# Patient Record
Sex: Male | Born: 1960 | Race: White | Hispanic: No | Marital: Married | Smoking: Former smoker
Health system: Southern US, Community
[De-identification: ages and names within clinical notes are randomized; demographics above are authoritative.]

## PROBLEM LIST (undated history)

## (undated) DIAGNOSIS — N189 Chronic kidney disease, unspecified: Secondary | ICD-10-CM

## (undated) DIAGNOSIS — R739 Hyperglycemia, unspecified: Secondary | ICD-10-CM

## (undated) DIAGNOSIS — I472 Ventricular tachycardia, unspecified: Secondary | ICD-10-CM

## (undated) DIAGNOSIS — I480 Paroxysmal atrial fibrillation: Secondary | ICD-10-CM

## (undated) DIAGNOSIS — I82890 Acute embolism and thrombosis of other specified veins: Secondary | ICD-10-CM

## (undated) DIAGNOSIS — E785 Hyperlipidemia, unspecified: Secondary | ICD-10-CM

## (undated) DIAGNOSIS — D849 Immunodeficiency, unspecified: Secondary | ICD-10-CM

## (undated) DIAGNOSIS — I739 Peripheral vascular disease, unspecified: Secondary | ICD-10-CM

## (undated) DIAGNOSIS — I639 Cerebral infarction, unspecified: Secondary | ICD-10-CM

## (undated) DIAGNOSIS — G4720 Circadian rhythm sleep disorder, unspecified type: Secondary | ICD-10-CM

## (undated) DIAGNOSIS — R3129 Other microscopic hematuria: Secondary | ICD-10-CM

## (undated) DIAGNOSIS — N289 Disorder of kidney and ureter, unspecified: Secondary | ICD-10-CM

## (undated) DIAGNOSIS — D899 Disorder involving the immune mechanism, unspecified: Secondary | ICD-10-CM

## (undated) DIAGNOSIS — I10 Essential (primary) hypertension: Secondary | ICD-10-CM

## (undated) DIAGNOSIS — I770 Arteriovenous fistula, acquired: Secondary | ICD-10-CM

## (undated) DIAGNOSIS — N210 Calculus in bladder: Secondary | ICD-10-CM

## (undated) DIAGNOSIS — L0390 Cellulitis, unspecified: Secondary | ICD-10-CM

## (undated) DIAGNOSIS — Z6831 Body mass index (BMI) 31.0-31.9, adult: Secondary | ICD-10-CM

## (undated) DIAGNOSIS — I495 Sick sinus syndrome: Secondary | ICD-10-CM

## (undated) DIAGNOSIS — R002 Palpitations: Secondary | ICD-10-CM

## (undated) DIAGNOSIS — E559 Vitamin D deficiency, unspecified: Secondary | ICD-10-CM

## (undated) DIAGNOSIS — G43909 Migraine, unspecified, not intractable, without status migrainosus: Secondary | ICD-10-CM

## (undated) DIAGNOSIS — I2510 Atherosclerotic heart disease of native coronary artery without angina pectoris: Secondary | ICD-10-CM

## (undated) DIAGNOSIS — I4891 Unspecified atrial fibrillation: Secondary | ICD-10-CM

## (undated) DIAGNOSIS — I422 Other hypertrophic cardiomyopathy: Secondary | ICD-10-CM

## (undated) DIAGNOSIS — I4892 Unspecified atrial flutter: Secondary | ICD-10-CM

## (undated) DIAGNOSIS — Z940 Kidney transplant status: Secondary | ICD-10-CM

## (undated) DIAGNOSIS — D751 Secondary polycythemia: Secondary | ICD-10-CM

## (undated) HISTORY — DX: Disorder involving the immune mechanism, unspecified: D89.9

## (undated) HISTORY — DX: Atherosclerotic heart disease of native coronary artery without angina pectoris: I25.10

## (undated) HISTORY — PX: KIDNEY TRANSPLANT: SHX239

## (undated) HISTORY — DX: Disorder of kidney and ureter, unspecified: N28.9

## (undated) HISTORY — DX: Hyperglycemia, unspecified: R73.9

## (undated) HISTORY — DX: Unspecified atrial fibrillation: I48.91

## (undated) HISTORY — DX: Hyperlipidemia, unspecified: E78.5

## (undated) HISTORY — DX: Palpitations: R00.2

## (undated) HISTORY — DX: Arteriovenous fistula, acquired: I77.0

## (undated) HISTORY — DX: Vitamin D deficiency, unspecified: E55.9

## (undated) HISTORY — DX: Immunodeficiency, unspecified: D84.9

## (undated) HISTORY — DX: Body mass index (BMI) 31.0-31.9, adult: Z68.31

## (undated) HISTORY — DX: Calculus in bladder: N21.0

## (undated) HISTORY — DX: Hypomagnesemia: E83.42

## (undated) HISTORY — DX: Other microscopic hematuria: R31.29

## (undated) HISTORY — DX: Kidney transplant status: Z94.0

## (undated) HISTORY — DX: Secondary polycythemia: D75.1

## (undated) HISTORY — DX: Cerebral infarction, unspecified: I63.9

## (undated) HISTORY — PX: AV FISTULA PLACEMENT: SHX1204

## (undated) HISTORY — DX: Other hypertrophic cardiomyopathy: I42.2

## (undated) HISTORY — DX: Ventricular tachycardia: I47.2

## (undated) HISTORY — DX: Unspecified atrial flutter: I48.92

## (undated) HISTORY — DX: Ventricular tachycardia, unspecified: I47.20

## (undated) HISTORY — PX: FRACTURE SURGERY: SHX138

## (undated) HISTORY — DX: Circadian rhythm sleep disorder, unspecified type: G47.20

---
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
## 1999-08-24 ENCOUNTER — Encounter: Payer: Self-pay | Admitting: Nephrology

## 1999-08-24 ENCOUNTER — Ambulatory Visit (HOSPITAL_COMMUNITY): Admission: RE | Admit: 1999-08-24 | Discharge: 1999-08-24 | Payer: Self-pay | Admitting: Nephrology

## 1999-12-03 HISTORY — PX: RENAL ARTERY STENT: SHX2321

## 2000-06-13 ENCOUNTER — Ambulatory Visit (HOSPITAL_COMMUNITY): Admission: RE | Admit: 2000-06-13 | Discharge: 2000-06-13 | Payer: Self-pay | Admitting: Emergency Medicine

## 2000-07-07 ENCOUNTER — Encounter: Payer: Self-pay | Admitting: Emergency Medicine

## 2000-07-07 ENCOUNTER — Inpatient Hospital Stay (HOSPITAL_COMMUNITY): Admission: EM | Admit: 2000-07-07 | Discharge: 2000-07-09 | Payer: Self-pay | Admitting: Emergency Medicine

## 2000-07-07 ENCOUNTER — Encounter: Payer: Self-pay | Admitting: Neurology

## 2000-07-08 ENCOUNTER — Encounter: Payer: Self-pay | Admitting: Neurology

## 2003-03-15 ENCOUNTER — Emergency Department (HOSPITAL_COMMUNITY): Admission: EM | Admit: 2003-03-15 | Discharge: 2003-03-15 | Payer: Self-pay | Admitting: Emergency Medicine

## 2003-03-15 ENCOUNTER — Encounter: Payer: Self-pay | Admitting: Emergency Medicine

## 2004-03-09 ENCOUNTER — Ambulatory Visit (HOSPITAL_COMMUNITY): Admission: RE | Admit: 2004-03-09 | Discharge: 2004-03-09 | Payer: Self-pay | Admitting: Family Medicine

## 2004-11-16 ENCOUNTER — Emergency Department (HOSPITAL_COMMUNITY): Admission: EM | Admit: 2004-11-16 | Discharge: 2004-11-16 | Payer: Self-pay | Admitting: Family Medicine

## 2006-12-02 HISTORY — PX: DENTAL SURGERY: SHX609

## 2008-01-28 ENCOUNTER — Emergency Department (HOSPITAL_COMMUNITY): Admission: EM | Admit: 2008-01-28 | Discharge: 2008-01-28 | Payer: Self-pay | Admitting: Family Medicine

## 2008-05-08 ENCOUNTER — Emergency Department (HOSPITAL_COMMUNITY): Admission: EM | Admit: 2008-05-08 | Discharge: 2008-05-08 | Payer: Self-pay | Admitting: Family Medicine

## 2009-02-01 ENCOUNTER — Ambulatory Visit (HOSPITAL_COMMUNITY): Admission: RE | Admit: 2009-02-01 | Discharge: 2009-02-01 | Payer: Self-pay | Admitting: Nephrology

## 2009-05-03 ENCOUNTER — Ambulatory Visit: Payer: Self-pay | Admitting: Vascular Surgery

## 2009-09-01 HISTORY — PX: INGUINAL HERNIA REPAIR: SUR1180

## 2009-09-01 HISTORY — PX: HERNIA REPAIR: SHX51

## 2009-09-12 ENCOUNTER — Ambulatory Visit (HOSPITAL_COMMUNITY): Admission: RE | Admit: 2009-09-12 | Discharge: 2009-09-12 | Payer: Self-pay | Admitting: Nephrology

## 2009-09-19 ENCOUNTER — Ambulatory Visit (HOSPITAL_COMMUNITY): Admission: RE | Admit: 2009-09-19 | Discharge: 2009-09-19 | Payer: Self-pay | Admitting: Surgery

## 2009-12-02 HISTORY — PX: AV FISTULA PLACEMENT: SHX1204

## 2010-01-03 ENCOUNTER — Ambulatory Visit: Payer: Self-pay | Admitting: Vascular Surgery

## 2010-01-11 ENCOUNTER — Ambulatory Visit: Payer: Self-pay | Admitting: Vascular Surgery

## 2010-01-11 ENCOUNTER — Ambulatory Visit (HOSPITAL_COMMUNITY): Admission: RE | Admit: 2010-01-11 | Discharge: 2010-01-11 | Payer: Self-pay | Admitting: Vascular Surgery

## 2010-02-21 ENCOUNTER — Ambulatory Visit: Payer: Self-pay | Admitting: Vascular Surgery

## 2011-01-31 ENCOUNTER — Other Ambulatory Visit (HOSPITAL_COMMUNITY): Payer: Self-pay | Admitting: Nephrology

## 2011-02-04 ENCOUNTER — Other Ambulatory Visit: Payer: Self-pay

## 2011-02-04 ENCOUNTER — Other Ambulatory Visit (HOSPITAL_COMMUNITY): Payer: Self-pay

## 2011-02-22 LAB — POCT I-STAT 4, (NA,K, GLUC, HGB,HCT)
Glucose, Bld: 105 mg/dL — ABNORMAL HIGH (ref 70–99)
HCT: 52 % (ref 39.0–52.0)
Potassium: 3.9 mEq/L (ref 3.5–5.1)

## 2011-03-07 LAB — CBC
Hemoglobin: 15.9 g/dL (ref 13.0–17.0)
MCV: 88.4 fL (ref 78.0–100.0)
Platelets: 161 10*3/uL (ref 150–400)
RBC: 5.35 MIL/uL (ref 4.22–5.81)
WBC: 7.6 10*3/uL (ref 4.0–10.5)

## 2011-03-07 LAB — BASIC METABOLIC PANEL
BUN: 51 mg/dL — ABNORMAL HIGH (ref 6–23)
CO2: 31 mEq/L (ref 19–32)
Calcium: 9.7 mg/dL (ref 8.4–10.5)
Chloride: 105 mEq/L (ref 96–112)
Creatinine, Ser: 4.3 mg/dL — ABNORMAL HIGH (ref 0.4–1.5)
GFR calc Af Amer: 18 mL/min — ABNORMAL LOW (ref >60)
Glucose, Bld: 101 mg/dL — ABNORMAL HIGH (ref 70–99)

## 2011-04-16 NOTE — Assessment & Plan Note (Signed)
OFFICE VISIT   Kulinski, Darrly R  DOB:  March 28, 1961                                       02/21/2010  ZOXWR#:[id number]   The patient returns today for followup after placement of a left  radiocephalic AV fistula on January 11, 2010.  On exam today, the  incision is well-healed.  He does have some areas of patchy numbness  around the incision, but these are overall not bothersome to him.  There  is an audible bruit and palpable thrill in the fistula.  It is still  fairly small in character at this point.  However, this should mature  into a nice fistula in the long-term.  He is currently not on dialysis.  I instructed him today in exercises to try to develop the maturity of  the fistula.  He will follow up with [place] on an as-needed basis.  He was  also instructed today on how to check the fistula today to make sure  that it has a palpable thrill.     Janetta Hora. Fields, MD  Electronically Signed   CEF/MEDQ  D:  02/21/2010  T:  02/22/2010  Job:  [id number]   cc:   Duke Salvia. Eliott Nine, M.D.

## 2011-04-16 NOTE — Assessment & Plan Note (Signed)
OFFICE VISIT   [name], [name]  DOB:  04/25/1961                                       01/03/2010  ZOXWR#:[id number]   The patient presents today for consideration of placement of long-term  hemodialysis access.  He was last seen in June of 2010.  He works as the  Holiday representative at [employer].   PAST MEDICAL HISTORY:  Significant for hypertension, coronary artery  disease, previous stroke, elevated cholesterol, congestive failure.  He  also has left ventricular hypertrophy.   PAST SURGICAL HISTORY:  He had a left elbow fracture.   I reviewed his notes from June of 2010 which showed he would be a  reasonable candidate for a left radiocephalic AV fistula.  Vein mapping  at that time showed the vein to be between 37-40 mm in diameter.   On placement of a tourniquet on the arm today the cephalic vein is  palpable at the wrist but does seem to bifurcate slightly above this  into two smaller branches.  He has a 2+ radial pulse.  Blood pressure  today is 145/87, heart rate 63 and regular, temperature 97.8.   I discussed with the patient today again the procedure of placing a  radiocephalic fistula and also told him this may require an additional  counter incision on the wrist to mobilize the vein if necessary.  Risks,  benefits, possible complications and procedure details were reviewed  including but not limited to bleeding, infection, some patchy numbness  over the area of the thumb, nonmaturation rate of the fistula 5-10%.  He  understands and agrees to proceed.  This is scheduled for 01/11/2010.     Janetta Hora. Fields, MD  Electronically Signed   CEF/MEDQ  D:  01/03/2010  T:  01/04/2010  Job:  [id number]

## 2011-04-16 NOTE — Assessment & Plan Note (Signed)
OFFICE VISIT   [name], [name]  DOB:  1961/07/10                                       05/03/2009  JYNWG#:[id number]   The patient presents today for consideration of placement of a long-term  hemodialysis access.  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  He currently is not on  dialysis.  He also states that he is considering peritoneal dialysis if  necessary in the future.  He works as the Holiday representative at  [employer].   PAST MEDICAL HISTORY:  Significant for hypertension, coronary artery  disease with previous stroke, elevated cholesterol and congestive heart  failure.  He also has left ventricular hypertrophy.   PAST SURGICAL HISTORY:  Left elbow fracture.   MEDICATIONS:  1. Nisoldipine 20 mg twice a day.  2. Lasix 80 mg in the morning and 40 mg in the evening.  3. Clonidine 0.2 mg twice a day.  4. Lopressor 50 mg three times a day.  5. Allopurinol 100 mg twice a day.  6. Colchicine 0.6 mg p.Haley.n.  7. Aspirin 325 mg once a day.  8. Plavix 75 mg once a day.  9. Simvastatin 40 mg once a day.  10.Lovaza 3 tablets twice a day.  11.Tums p.Haley.n.  12.Enalapril 10 mg once a day.  13.Calcitriol 0.25 mcg Monday, Wednesday, Friday.  14.Fenofibrate 140 mg once daily.   ALLERGIES:  He states he is allergic to penicillin which causes a rash.   REVIEW OF SYSTEMS:  He is a smoker of half pack per day.  He drinks one  alcoholic beverage per week.  Review of systems was completed  extensively.  Please see the patient evaluation sheet in the chart for  further details.   PHYSICAL EXAM:  Blood pressure is 137/87 in the left arm, pulse is 51  and regular.  Left upper extremity has a 2+ brachial and 2+ radial  pulse.  There is some mild deformity of the left elbow but he is able to  essentially fully extend the arm.  On placement of the tourniquet on the  left arm the left cephalic vein is of reasonable  quality.  It is more  palpable in the upper arm than in the lower arm.   He had a vein mapping ultrasound today which showed the cephalic vein in  the lower arm is between 37-40 mm in diameter.  It is 50 mm in diameter  in the upper portion.  On the right side it was 30-40 mm in the forearm  and 40-50 mm in the upper arm.   I believe the best option for the patient would be placement of a left  radiocephalic AV fistula.  Risks, benefits and possible complications  and procedure details including but not limited to bleeding, infection,  nonmaturation of the fistula and steal were explained to the patient  today.  He understands and agrees to proceed.  He is going to call us  back to schedule this after his vacation in July.   Janetta Hora. Fields, MD  Electronically Signed   CEF/MEDQ  D:  05/03/2009  T:  05/04/2009  Job:  [id number]   cc:   Duke Salvia. Eliott Nine, M.D.

## 2011-04-16 NOTE — Procedures (Signed)
CEPHALIC VEIN MAPPING   INDICATION:  Evaluation prior to placement of permanent dialysis access.   HISTORY:   EXAM:  The right cephalic vein is compressible.   Diameter measurements range from 0.3 to 0.5 cm.   The left cephalic vein is compressible.   Diameter measurements range from 0.5 to 0.4 cm.   See attached worksheet for all measurements.   IMPRESSION:  1. Patent bilateral cephalic veins which are of acceptable diameter      for use as dialysis access site.  2. There are two major branches of each cephalic vein.  One just      proximal and one just distal to the antecubital fossa crease.   ___________________________________________  Janetta Hora. Fields, MD   MC/MEDQ  D:  05/03/2009  T:  05/03/2009  Job:  [id number]

## 2011-04-19 NOTE — H&P (Signed)
[place]  Patient:    [name], [name]                            MRN: [id number] Adm. Date:  [id number] Attending:  Fenton Malling                         History and Physical  DATE OF BIRTH:  13-Aug-1961  CHIEF COMPLAINT:  Ataxia, nausea and vomiting.  HISTORY OF PRESENT ILLNESS:  A 50 year old white male with a history of severe hypertension, chronic renal insufficiency and gout, brought to the emergency room for a one day history of nausea, vomiting and unsteadiness of gait.  He reports that his symptoms began yesterday afternoon and were not particularly sudden in onset.  His initial symptoms were nausea, vomiting and diarrhea.  He had taken colchicine earlier that day and commonly has diarrhea for that.  The nausea and vomiting were unusual for him.  Today, he reports that these symptoms persist although not as severe but now he notices a dizzy feeling when he stands or turns while he is standing.  He describes this as a light headed rather than vertigo.  He has also noticed a tendency to list to the right when he walks.  He has not had any falls but his wife says that he has come close.  He denies any history of previous similar events, although he does say he had one event several years ago in the setting of very high blood pressure which was described as a stroke.  He denies any diplopia, arm or leg weakness or numbness, dysphagia, or bowel or bladder symptoms.  He has noticed that the right side of his face feels numb but denies droop.  His wife thinks that he might have had some slurred speech this morning but this seems to be better. He has taken aspirin in the past but was discontinued almost a year ago for unclear reasons.  PAST MEDICAL HISTORY:  Severe hypertension, relatively well controlled. Chronic renal insufficiency with creatinine 1.5 to 3.0 and gout.  FAMILY HISTORY:  Unknown.  The patient is adopted.  SOCIAL HISTORY:   He lives with his wife and two children.  He smokes about a pack a day and occasionally uses alcohol.  ALLERGIES:  Penicillin.  MEDICATIONS:  Clonidine 0.2 mg p.o. b.i.d. Norvasc 5 mg p.o. b.i.d. Metoprolol 50 mg p.o. b.i.d. Lasix 40 mg p.o. q.d.  Allopurinol 100 mg p.o. b.i.d. and colchicine p.r.n.  REVIEW OF SYSTEMS:  Remarkable for the GI symptoms as above, otherwise is negative.  PHYSICAL EXAMINATION:  VITAL SIGNS:  Temperature 98.7, blood pressure 143/94, pulse 68, respirations 18.  GENERAL: He is alert and healthy appearing man in no evident distress.  HEENT:  Head: Cranium is normocephalic and atraumatic.  Oropharynx is benign.  NECK: Supple without bruits.  CHEST: Clear to auscultation bilaterally.  HEART:  Regular rate and rhythm without murmurs.  ABDOMEN:  Soft.  EXTREMITIES:  No edema.  2+ pulses.  NEUROLOGIC:  Mental status:  He is awake, alert and fully oriented.  He is able to get a clear and concise history.  Mood is euthymic and affect appropriate.  Speech is fluent and not dysarthric.  Cranial nerves: Funduscopic exam is benign.  Pupils are equal and briskly reactive. Extraocular movements are normal without nystagmus.  Visual fields are full to confrontation.  Decreased  sensation to light touch is noted over the right half of the face.  Facial strength is normal.  Tongue and palate move well and in the midline.   Motor exam:  Normal bulk and tone.  No atrophy or fasciculations.  Normal strength in all tested extremity muscles.  Sensation: Intact to light touch and vibration in all extremities.  Cerebellar:  Rapid alternating movements are normal.  Finger to nose is normal.  Reflexes are slightly brisk but not pathological.  Toes are downgoing.  Gait:  He has a slight tendency to list to the right when he walks.  This is greatly exaggerated on attempt at tandem which he is basically unable to do.  He can heel and toe walk.  LABORATORY DATA:  CBC:  White  cells 10.1, hemoglobin 17.9, hematocrit 49.8, platelets 165,000.  C-MET is remarkable for BUN of 24 and creatinine 2.0. Coags are normal.  Urine drug screen is negative.  CT scan of the head is normal.  IMPRESSION:  A 50 year old man with severe hypertension and one day history of nausea and vomiting and ataxia with listing to the left.  Suspect a brain stem stroke.  Symptoms have improved.  PLAN: 1. MRI with MR angiogram. 2. Echocardiogram. 3. Plavix (preferred to aspirin given his chronic renal insufficiency). DD:  07/07/00 TD:  07/08/00 Job: [id number]

## 2011-04-19 NOTE — Discharge Summary (Signed)
[place]. [facility]  Patient:    [name], [name]                            MRN: [id number] Adm. Date:  [id number] Disc. Date: [id number] Attending:  Fenton Malling CC:         Anna Genre. Little, M.D.  Duke Salvia Eliott Nine, M.D.   Discharge Summary  PATIENTS ADDRESS:  [address], [place], [place]  [address].  DATE OF BIRTH:  10/05/1961.  HISTORY OF PRESENT ILLNESS:  This was the first [place] admission for this 50 year old right-handed white married male with a known history of severe hypertension and chronic renal insufficiency and gout. He was brought to the emergency room on the day of admission for evaluation of nausea, vomiting, and unsteadiness of gait with complaints of numbness in the right V1 and V2 distribution and some pain occurring in his right ear.  He had noted the sensation of dizziness when he would stand or turn and describes a lightheaded sensation rather than true vertigo and a tendency to list to the right when he walked.  He had had no history of similar events and denied double vision, hiccups, swallowing problems, nausea or vomiting, or bowel or bladder symptomatology.  PAST MEDICAL HISTORY:  This is significant for hypertension with chronic renal insufficiency and gout.  MEDICATIONS AT ADMISSION: 1. Clonidine 0.2 mg p.o. b.i.d. 2. Norvasc 5 mg p.o. b.i.d. 3. Metoprolol 50 mg p.o. b.i.d. 4. Lasix 40 mg p.o. q.d. 5. Allopurinol 100 mg p.o. b.i.d. 6. Colchicine p.r.n. for gout.  REVIEW OF SYSTEMS:  This was unremarkable.  PHYSICAL EXAMINATION:  His general examination was unremarkable.  His neurologic examination revealed no nystagmus. Extraocular movements full.  No ocular dysmetria.  Subjective altered sensation in the right V1 and V2. Unsteadiness with tendency to list to the right when he walked and clumsiness with the right hand and arm as compared to the left.  LABORATORY DATA:   Doppler study of the carotids showing no significant ICA stenosis and antegrade vertebral flow.  A 2D echocardiogram of the heart showed no evidence of cardiac source of emboli.  MRI study of the brain showed small acute lacunar infarction involving the right posterior lateral aspect of the medulla and chronic small vessel with ischemic changes occurring in the white matter of the cerebrum.  MR angiogram was negative of the intracranial vessels of the brain. A CT scan of the brain was negative, non-contrast cranial CT scan.  A chest x-ray was normal without any evidence of pleural effusion present though the question was raised on the 2D echocardiogram by Jaclyn Prime. Lucas Mallow, M.D.  On this study the estimated left ventricular ejection fraction was greater than 75%.  The left atrium was mildly dilated and he raised the question of a moderate sized left pleural effusion.  A 12-lead EKG showed normal sinus rhythm, left ventricular hypertrophy with repolarization changes present.  Hemoglobin was 17.9, hematocrit 49.8, white blood cell count 10,100; 83% polys, 89% lymphs, 5% monocytes, and 2% eosinophils, 0% basophils.  PT was 12.5, INR 0.9.  Glucose was 102, BUN 24, creatinine 2.0, sodium 137, potassium 3.5, chloride 104, CO2 content 27, calcium 9.0, total protein 6.7, albumin 3.7, AST 20, ALT 23, alkaline phosphatase 97, total bilirubin 0.9.  Urine drug screen revealed none detected and a urinalysis showed a trace hemoglobin but no other abnormalities otherwise.  Pending  studies at the time of discharge include antithrombin III, sed rate, protein C, protein S, anticardiolipin antibodies, homocystine level, lipid profile.  HOSPITAL COURSE:  The patient was admitted with a right facial numbness, right ear pain, clumsiness to the right and dizziness.  Blood pressure in the hospital ranged from 140 to 115/70 range.  He remained afebrile.  He had good p.o. intake.  His examination in the hospital  continued to show some evidence of right-sided numbness in the V1 and V2 distribution but very little other focal neurologic findings.  The patient was kept on his antihypertensives and Plavix was begun.  The patient had no recurrent neurologic symptoms in the hospital.  MRI did show the evidence of stroke occurring in the right medullary region.  IMPRESSION: 1. 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 2. Hypertension; code 796.2. 3. Chronic cigarette use; code 496. 4. Chronic renal failure; code 585.  PLAN:  Discharge the patient.  The patient is not to drive a car for three weeks and not to return to work for three weeks.  DISCHARGE MEDICATIONS: 1. Plavix 75 mg p.o. q.d. 2. Aspirin 325 mg p.o. q.d. 3. Zyban 150 mg SR one q.d. x 2 days and then b.i.d. 4. Clonidine 0.2 mg p.o. b.i.d. 5. Norvasc 5 mg p.o. b.i.d. 6. Metoprolol 50 mg p.o. b.i.d. 7. Lasix 40 mg p.o. q.d. 8. Allopurinol 100 mg p.o. b.i.d. 9. Colchicine p.r.n. for gout.  FOLLOW-UP:  He will return to see me in two weeks for follow-up evaluation. DD:  07/09/00 TD:  07/10/00 Job: [id number]

## 2011-07-11 ENCOUNTER — Other Ambulatory Visit (HOSPITAL_COMMUNITY): Payer: Self-pay | Admitting: Radiology

## 2011-08-26 LAB — INFLUENZA A AND B ANTIGEN (CONVERTED LAB): Inflenza A Ag: NEGATIVE

## 2011-08-26 LAB — POCT RAPID STREP A: Streptococcus, Group A Screen (Direct): NEGATIVE

## 2011-09-10 ENCOUNTER — Inpatient Hospital Stay (INDEPENDENT_AMBULATORY_CARE_PROVIDER_SITE_OTHER)
Admission: RE | Admit: 2011-09-10 | Discharge: 2011-09-10 | Disposition: A | Discharge status: Home / Self Care | Payer: 59 | Source: Ambulatory Visit | Attending: Emergency Medicine | Admitting: Emergency Medicine

## 2011-09-10 DIAGNOSIS — T7840XA Allergy, unspecified, initial encounter: Secondary | ICD-10-CM

## 2011-09-19 ENCOUNTER — Inpatient Hospital Stay (INDEPENDENT_AMBULATORY_CARE_PROVIDER_SITE_OTHER)
Admission: RE | Admit: 2011-09-19 | Discharge: 2011-09-19 | Disposition: A | Discharge status: Home / Self Care | Payer: 59 | Source: Ambulatory Visit | Attending: Family Medicine | Admitting: Family Medicine

## 2011-09-19 DIAGNOSIS — J029 Acute pharyngitis, unspecified: Secondary | ICD-10-CM

## 2011-09-19 DIAGNOSIS — B9789 Other viral agents as the cause of diseases classified elsewhere: Secondary | ICD-10-CM

## 2011-09-19 LAB — POCT RAPID STREP A: Streptococcus, Group A Screen (Direct): NEGATIVE

## 2011-11-05 ENCOUNTER — Encounter: Payer: Self-pay | Admitting: Cardiology

## 2011-11-05 ENCOUNTER — Emergency Department (HOSPITAL_COMMUNITY)
Admission: EM | Admit: 2011-11-05 | Discharge: 2011-11-05 | Disposition: A | Discharge status: Home / Self Care | Payer: 59 | Attending: Emergency Medicine | Admitting: Emergency Medicine

## 2011-11-05 DIAGNOSIS — Z7982 Long term (current) use of aspirin: Secondary | ICD-10-CM | POA: Insufficient documentation

## 2011-11-05 DIAGNOSIS — R112 Nausea with vomiting, unspecified: Secondary | ICD-10-CM | POA: Insufficient documentation

## 2011-11-05 DIAGNOSIS — E860 Dehydration: Secondary | ICD-10-CM | POA: Insufficient documentation

## 2011-11-05 DIAGNOSIS — Z79899 Other long term (current) drug therapy: Secondary | ICD-10-CM | POA: Insufficient documentation

## 2011-11-05 DIAGNOSIS — N186 End stage renal disease: Secondary | ICD-10-CM | POA: Insufficient documentation

## 2011-11-05 DIAGNOSIS — R42 Dizziness and giddiness: Secondary | ICD-10-CM | POA: Insufficient documentation

## 2011-11-05 DIAGNOSIS — N289 Disorder of kidney and ureter, unspecified: Secondary | ICD-10-CM | POA: Insufficient documentation

## 2011-11-05 DIAGNOSIS — R5381 Other malaise: Secondary | ICD-10-CM | POA: Insufficient documentation

## 2011-11-05 DIAGNOSIS — R1110 Vomiting, unspecified: Secondary | ICD-10-CM

## 2011-11-05 DIAGNOSIS — R197 Diarrhea, unspecified: Secondary | ICD-10-CM | POA: Insufficient documentation

## 2011-11-05 DIAGNOSIS — R231 Pallor: Secondary | ICD-10-CM | POA: Insufficient documentation

## 2011-11-05 DIAGNOSIS — I120 Hypertensive chronic kidney disease with stage 5 chronic kidney disease or end stage renal disease: Secondary | ICD-10-CM | POA: Insufficient documentation

## 2011-11-05 DIAGNOSIS — R5383 Other fatigue: Secondary | ICD-10-CM | POA: Insufficient documentation

## 2011-11-05 HISTORY — DX: Essential (primary) hypertension: I10

## 2011-11-05 LAB — CBC
HCT: 47.6 % (ref 39.0–52.0)
Hemoglobin: 16.5 g/dL (ref 13.0–17.0)
MCHC: 34.7 g/dL (ref 30.0–36.0)
RDW: 14 % (ref 11.5–15.5)

## 2011-11-05 LAB — COMPREHENSIVE METABOLIC PANEL
Albumin: 2.9 g/dL — ABNORMAL LOW (ref 3.5–5.2)
BUN: 78 mg/dL — ABNORMAL HIGH (ref 6–23)
Glucose, Bld: 123 mg/dL — ABNORMAL HIGH (ref 70–99)
Potassium: 3.6 mEq/L (ref 3.5–5.1)
Sodium: 137 mEq/L (ref 135–145)
Total Bilirubin: 0.4 mg/dL (ref 0.3–1.2)

## 2011-11-05 LAB — DIFFERENTIAL
Basophils Absolute: 0 10*3/uL (ref 0.0–0.1)
Eosinophils Relative: 1 % (ref 0–5)
Neutro Abs: 9.5 10*3/uL — ABNORMAL HIGH (ref 1.7–7.7)
Neutrophils Relative %: 93 % — ABNORMAL HIGH (ref 43–77)

## 2011-11-05 LAB — URINALYSIS, ROUTINE W REFLEX MICROSCOPIC
Bilirubin Urine: NEGATIVE
Nitrite: NEGATIVE
Specific Gravity, Urine: 1.014 (ref 1.005–1.030)
pH: 6.5 (ref 5.0–8.0)

## 2011-11-05 LAB — URINE MICROSCOPIC-ADD ON

## 2011-11-05 MED ORDER — ONDANSETRON HCL 4 MG/2ML IJ SOLN
4.0000 mg | INTRAMUSCULAR | Status: DC | PRN
  Administered 2011-11-05: 4 mg via INTRAVENOUS
  Filled 2011-11-05: qty 2

## 2011-11-05 MED ORDER — ONDANSETRON 4 MG PO TBDP
ORAL_TABLET | ORAL | Status: AC
  Filled 2011-11-05: qty 2

## 2011-11-05 MED ORDER — ACETAMINOPHEN 325 MG PO TABS
650.0000 mg | ORAL_TABLET | Freq: Once | ORAL | Status: AC
  Administered 2011-11-05: 650 mg via ORAL

## 2011-11-05 MED ORDER — SODIUM CHLORIDE 0.9 % IV BOLUS (SEPSIS)
1000.0000 mL | Freq: Once | INTRAVENOUS | Status: AC
  Administered 2011-11-05: 1000 mL via INTRAVENOUS

## 2011-11-05 MED ORDER — ACETAMINOPHEN 325 MG PO TABS
ORAL_TABLET | ORAL | Status: AC
  Administered 2011-11-05: 650 mg via ORAL
  Filled 2011-11-05: qty 2

## 2011-11-05 MED ORDER — PROMETHAZINE HCL 25 MG RE SUPP: 25.0000 mg | Freq: Four times a day (QID) | RECTAL | Status: AC | PRN

## 2011-11-05 MED ORDER — LOPERAMIDE HCL 2 MG PO CAPS
2.0000 mg | ORAL_CAPSULE | ORAL | Status: DC | PRN
  Administered 2011-11-05 (×2): 2 mg via ORAL
  Filled 2011-11-05 (×2): qty 1

## 2011-11-05 MED ORDER — METOCLOPRAMIDE HCL 10 MG PO TABS: 10.0000 mg | ORAL_TABLET | Freq: Four times a day (QID) | ORAL | Status: AC | PRN

## 2011-11-05 MED ORDER — SODIUM CHLORIDE 0.9 % IV SOLN
Freq: Once | INTRAVENOUS | Status: AC
  Administered 2011-11-05: 13:00:00 via INTRAVENOUS

## 2011-11-05 MED ORDER — ONDANSETRON HCL 4 MG PO TABS: 4.0000 mg | ORAL_TABLET | Freq: Four times a day (QID) | ORAL | Status: AC

## 2011-11-05 NOTE — ED Notes (Signed)
Patient is resting.  Spoke to wife. Will wake in 30 minutes for vital signs.

## 2011-11-05 NOTE — Progress Notes (Signed)
He is feeling better after IV fluids. He states he still feels weak. He will be discharged home. Dr. Lynelle Doctor had written a prescription for Phenergan suppository. I will also give a prescription for oral Reglan. He is instructed to use over-the-counter Imodium right ear as needed for diarrhea. A note is given for him to be off work for the next 2 days.

## 2011-11-05 NOTE — ED Notes (Signed)
Pt states that she feels better, no nausea.

## 2011-11-05 NOTE — ED Notes (Signed)
Have been checking on patient.  Wife is by the bedside.  He is attempting to void at this time.

## 2011-11-05 NOTE — ED Notes (Signed)
Pt reports that the symptom onset was last night, unable to tolerate liquids or solids. Reports that he has been around others dx with the flu recently. Pt reports that he has generalized weakness and achy.

## 2011-11-05 NOTE — ED Notes (Signed)
Gave pt pitcher of water and wife diet coke

## 2011-11-05 NOTE — ED Notes (Signed)
Requested urine sample from patient.  He is unable to void at this time.

## 2011-11-05 NOTE — ED Notes (Signed)
Pt here with flu like symptoms, states that he has been around people lately who have had the flu, last night started vomiting, unable to keep anything down po.  Also feeling weak and lethargic.

## 2011-11-05 NOTE — ED Notes (Signed)
Pt reports that he has a fistula but not on dialysis a this time. Waiting on a kidney transplant.

## 2011-11-05 NOTE — ED Provider Notes (Cosign Needed)
History     CSN: [id number] Arrival date & time: 11/05/2011  9:50 AM   First MD Initiated Contact with Patient 11/05/11 0957      Chief Complaint  Patient presents with  . Nausea  . Emesis  . Diarrhea    (Consider location/radiation/quality/duration/timing/severity/associated sxs/prior treatment) HPI  Patient relates he and his whole family had URI symptoms last week with cough and rhinorrhea. He states he had improved until  last night he had acute onset of nausea and diarrhea. He states he's been having diarrhea every hour that's very watery. He also relates he was vomiting every 20 minutes for several hours and his last vomiting episode was 2:30 this morning. He relates he still has nausea. He denies abdominal pain, states he has some headache now. He states he's having some cramping in his calf and he thinks he may have fever, undocumented and has  had chills. He states he feels weak and lightheaded.  Patient has end-stage renal disease and states his baseline creatinine is about 5.2. He is supposed to get a renal transplant in January from his son.  Primary care physician Dr. Eliott Nine  Past Medical History  Diagnosis Date  . Renal disorder   . Hypertension     History reviewed. No pertinent past surgical history.  History reviewed. No pertinent family history.  History  Substance Use Topics  . Smoking status: yes  . Smokeless tobacco: Not on file  . Alcohol Use: occassional  Employed Lives with spouse    Review of Systems  All other systems reviewed and are negative.    Allergies  Penicillins  Home Medications   Current Outpatient Rx  Name Route Sig Dispense Refill  . ALLOPURINOL 100 MG PO TABS Oral Take 100 mg by mouth 2 (two) times daily.      . ASPIRIN 325 MG PO TABS Oral Take 325 mg by mouth daily.      Marland Kitchen CALCITRIOL 0.25 MCG PO CAPS Oral Take 0.25 mcg by mouth 3 (three) times daily.      Marland Kitchen CLONIDINE HCL 0.2 MG PO TABS Oral Take 0.2 mg by mouth 2 (two)  times daily.      Marland Kitchen CLOPIDOGREL BISULFATE 75 MG PO TABS Oral Take 75 mg by mouth daily.      . COLCHICINE 0.6 MG PO TABS Oral Take 0.6 mg by mouth daily as needed. For gout     . FENOFIBRATE MICRONIZED 134 MG PO CAPS Oral Take 134 mg by mouth as directed. Take 134mg  on Mon, Wed, and Fri.     . FUROSEMIDE 80 MG PO TABS Oral Take 80 mg by mouth 2 (two) times daily.      Marland Kitchen METOPROLOL TARTRATE 50 MG PO TABS Oral Take 50 mg by mouth 4 (four) times daily.      Marland Kitchen NISOLDIPINE 20 MG PO TB24 Oral Take 20 mg by mouth every morning.      Marland Kitchen OMEGA-3-ACID ETHYL ESTERS 1 G PO CAPS Oral Take 3 g by mouth 2 (two) times daily.      Marland Kitchen SIMVASTATIN 40 MG PO TABS Oral Take 40 mg by mouth at bedtime.      Marland Kitchen VITAMIN D (ERGOCALCIFEROL) 50000 UNITS PO CAPS Oral Take 50,000 Units by mouth every 7 (seven) days. Take Tuesday     . ONDANSETRON HCL 4 MG PO TABS Oral Take 1 tablet (4 mg total) by mouth every 6 (six) hours. 8 tablet 0  . PROMETHAZINE HCL 25 MG RE  SUPP Rectal Place 1 suppository (25 mg total) rectally every 6 (six) hours as needed for nausea. 12 each 0    BP 117/74  Pulse 74  Temp(Src) 99.7 F (37.6 C) (Oral)  Resp 24  SpO2 95%  Vital signs low-grade fever  Physical Exam  Nursing note and vitals reviewed. Constitutional: He is oriented to person, place, and time. He appears well-developed and well-nourished.  Non-toxic appearance. He does not appear ill. No distress.  HENT:  Head: Normocephalic and atraumatic.  Right Ear: External ear normal.  Left Ear: External ear normal.  Nose: Nose normal. No mucosal edema or rhinorrhea.  Mouth/Throat: Mucous membranes are normal. No dental abscesses or uvula swelling.       His membranes are dry  Eyes: Conjunctivae and EOM are normal. Pupils are equal, round, and reactive to light.  Neck: Normal range of motion and full passive range of motion without pain. Neck supple.  Cardiovascular: Normal rate, regular rhythm and normal heart sounds.  Exam reveals no  gallop and no friction rub.   No murmur heard. Pulmonary/Chest: Effort normal and breath sounds normal. No respiratory distress. He has no wheezes. He has no rhonchi. He has no rales. He exhibits no tenderness and no crepitus.  Abdominal: Soft. Normal appearance and bowel sounds are normal. He exhibits no distension. There is no tenderness. There is no rebound and no guarding.  Musculoskeletal: Normal range of motion. He exhibits no edema and no tenderness.       Moves all extremities well.   Neurological: He is alert and oriented to person, place, and time. He has normal strength. No cranial nerve deficit.  Skin: Skin is warm, dry and intact. No rash noted. No erythema. There is pallor.  Psychiatric: His speech is normal and behavior is normal. His mood appears not anxious.       Affect flat    ED Course  Procedures (including critical care time)  Patient was given IV fluids, IV Zofran and oral Imodium for his vomiting and diarrhea. Dr. Hyman Hopes did come by to see patient. He states the patient if this patient needs to beadmitted to have hospitalist,  otherwise he can be discharged to followup with Dr.Dunham  Results for orders placed during the hospital encounter of 11/05/11  COMPREHENSIVE METABOLIC PANEL      Component Value Range   Sodium 137  135 - 145 (mEq/L)   Potassium 3.6  3.5 - 5.1 (mEq/L)   Chloride 98  96 - 112 (mEq/L)   CO2 25  19 - 32 (mEq/L)   Glucose, Bld 123 (*) 70 - 99 (mg/dL)   BUN 78 (*) 6 - 23 (mg/dL)   Creatinine, Ser 1.61 (*) 0.50 - 1.35 (mg/dL)   Calcium 8.9  8.4 - 09.6 (mg/dL)   Total Protein 6.2  6.0 - 8.3 (g/dL)   Albumin 2.9 (*) 3.5 - 5.2 (g/dL)   AST 28  0 - 37 (U/L)   ALT 24  0 - 53 (U/L)   Alkaline Phosphatase 46  39 - 117 (U/L)   Total Bilirubin 0.4  0.3 - 1.2 (mg/dL)   GFR calc non Af Amer 12 (*) >90 (mL/min)   GFR calc Af Amer 13 (*) >90 (mL/min)  CBC      Component Value Range   WBC 10.1  4.0 - 10.5 (K/uL)   RBC 5.45  4.22 - 5.81 (MIL/uL)    Hemoglobin 16.5  13.0 - 17.0 (g/dL)   HCT 04.5  40.9 -  52.0 (%)   MCV 87.3  78.0 - 100.0 (fL)   MCH 30.3  26.0 - 34.0 (pg)   MCHC 34.7  30.0 - 36.0 (g/dL)   RDW 16.1  09.6 - 04.5 (%)   Platelets 137 (*) 150 - 400 (K/uL)  DIFFERENTIAL      Component Value Range   Neutrophils Relative 93 (*) 43 - 77 (%)   Neutro Abs 9.5 (*) 1.7 - 7.7 (K/uL)   Lymphocytes Relative 3 (*) 12 - 46 (%)   Lymphs Abs 0.3 (*) 0.7 - 4.0 (K/uL)   Monocytes Relative 4  3 - 12 (%)   Monocytes Absolute 0.4  0.1 - 1.0 (K/uL)   Eosinophils Relative 1  0 - 5 (%)   Eosinophils Absolute 0.1  0.0 - 0.7 (K/uL)   Basophils Relative 0  0 - 1 (%)   Basophils Absolute 0.0  0.0 - 0.1 (K/uL)  URINALYSIS, ROUTINE W REFLEX MICROSCOPIC      Component Value Range   Color, Urine YELLOW  YELLOW    APPearance CLEAR  CLEAR    Specific Gravity, Urine 1.014  1.005 - 1.030    pH 6.5  5.0 - 8.0    Glucose, UA NEGATIVE  NEGATIVE (mg/dL)   Hgb urine dipstick SMALL (*) NEGATIVE    Bilirubin Urine NEGATIVE  NEGATIVE    Ketones, ur NEGATIVE  NEGATIVE (mg/dL)   Protein, ur >409 (*) NEGATIVE (mg/dL)   Urobilinogen, UA 0.2  0.0 - 1.0 (mg/dL)   Nitrite NEGATIVE  NEGATIVE    Leukocytes, UA NEGATIVE  NEGATIVE   URINE MICROSCOPIC-ADD ON      Component Value Range   Squamous Epithelial / LPF RARE  RARE    WBC, UA 0-2  <3 (WBC/hpf)   RBC / HPF 0-2  <3 (RBC/hpf)   Bacteria, UA RARE  RARE    Laboratory interpretation stable renal failure otherwise normal   1. Vomiting   2. Diarrhea   3. Dehydration   4. Renal insufficiency    Plan disposition per Dr Preston Fleeting after he gets IV fluids and meds for nausea, vomiting and diarrhea.    MDM          Ward Givens, MD 11/05/11 [phone]

## 2011-12-04 DIAGNOSIS — I10 Essential (primary) hypertension: Secondary | ICD-10-CM | POA: Insufficient documentation

## 2011-12-04 DIAGNOSIS — E785 Hyperlipidemia, unspecified: Secondary | ICD-10-CM | POA: Insufficient documentation

## 2011-12-10 ENCOUNTER — Encounter: Payer: Self-pay | Admitting: Cardiovascular Disease

## 2011-12-12 ENCOUNTER — Encounter: Payer: Self-pay | Admitting: Cardiovascular Disease

## 2011-12-31 ENCOUNTER — Encounter: Payer: Self-pay | Admitting: Cardiovascular Disease

## 2012-01-13 ENCOUNTER — Encounter: Payer: Self-pay | Admitting: *Deleted

## 2012-01-13 ENCOUNTER — Encounter: Payer: Self-pay | Admitting: Cardiovascular Disease

## 2012-01-15 ENCOUNTER — Ambulatory Visit (INDEPENDENT_AMBULATORY_CARE_PROVIDER_SITE_OTHER): Payer: 59 | Admitting: Cardiovascular Disease

## 2012-01-15 ENCOUNTER — Encounter: Payer: Self-pay | Admitting: Cardiovascular Disease

## 2012-01-15 VITALS — BP 116/76 | HR 56 | Wt 188.0 lb

## 2012-01-15 DIAGNOSIS — R9439 Abnormal result of other cardiovascular function study: Secondary | ICD-10-CM | POA: Insufficient documentation

## 2012-01-15 DIAGNOSIS — IMO0001 Reserved for inherently not codable concepts without codable children: Secondary | ICD-10-CM | POA: Insufficient documentation

## 2012-01-15 DIAGNOSIS — R011 Cardiac murmur, unspecified: Secondary | ICD-10-CM | POA: Insufficient documentation

## 2012-01-15 DIAGNOSIS — I10 Essential (primary) hypertension: Secondary | ICD-10-CM

## 2012-01-15 DIAGNOSIS — Z01818 Encounter for other preprocedural examination: Secondary | ICD-10-CM

## 2012-01-15 DIAGNOSIS — N189 Chronic kidney disease, unspecified: Secondary | ICD-10-CM

## 2012-01-15 DIAGNOSIS — F17200 Nicotine dependence, unspecified, uncomplicated: Secondary | ICD-10-CM

## 2012-01-15 DIAGNOSIS — N183 Chronic kidney disease, stage 3 unspecified: Secondary | ICD-10-CM | POA: Insufficient documentation

## 2012-01-15 NOTE — Assessment & Plan Note (Signed)
Pre transplant clearance and abnormal myovue.  F/U dobutamine echo.  Test may be suboptimal.  Chance of PAF and hypotension from hypertrophic physiology.  However if we get his HR up to 160 or greater and EF remains hyperdynamic periop risk would be low.  Hold beta blicker day before and morning of test.  Doing a heart cath on an asymptomatic person prior to renal transplant and making him have dialysis In my mind would do more harm than good

## 2012-01-15 NOTE — Progress Notes (Signed)
Patient ID: [name], male   DOB: 1961/05/15, 51 y.o.   MRN: [id number] 51 yo referred from [facility] organ transplant department.  Long standing HTN and CRF.  Cr runs around 5.3.  Has fistula placed over a year ago but no dialysis.  Reviewed reports from [place].  Echo 12/12/11 Moderate asymetric LVH 19mm EF 55-60%.  Severe apical hypertrophy mild MR trace TR with no evidence of pulmonary hypertension.  Myovue 1/29 showed mild to moderate "photopenia in mid cavity to apical regions of the anteroseptum, inferoseptum and inferior walls.  EF 51%.  He is not active but does all ADL's , mows lawn and keeps up with kids.  His organ donor is actually his son.  No SSCP, palpitatons., syncope or edema.  ECG chronically abnormal with apical hypertrophy/LVH  Has lateral T wave inversion.  Does get palpitations on occasion and noted to have PAC;s in exam room.  Still smoking 4-5 cig/day.  Will quit prior to surgery.  Counseled for less than 10 minutes on cessation and benefit in regard to immunosupression and intubation.    Long discussion with Gala Romney.  3 options: 1) Cardiac cath with dye that would almost certainly cause need for temporary dialysis 2) No further testing and proceed with surgery 3) Different functional study to reassure [place] that his surgical risk is low.  We both agreed that a dobutamine echo would help clarify the situation.  He does not want a cath for obvious reasons.  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  All other systems reviewed and negative   General: Affect appropriate Healthy:  appears stated age HEENT: normal Neck supple with no adenopathy JVP normal no  bruits no thyromegaly Lungs clear with no wheezing and good diaphragmatic motion Heart:  S1/S2 SEM murmur,rub, gallop or click PMI normal Abdomen: benighn, BS positve, no tenderness, no AAA no bruit.  No HSM or HJR Distal pulses intact with no bruits No edema Neuro non-focal Skin warm and dry No muscular weakness Fistula LUE  Medications Current Outpatient Prescriptions  Medication Sig Dispense Refill  . allopurinol (ZYLOPRIM) 100 MG tablet Take 100 mg by mouth 2 (two) times daily.        Marland Kitchen aspirin 325 MG tablet Take 325 mg by mouth daily.        . calcitRIOL (ROCALTROL) 0.25 MCG capsule 3 times weekly      . cloNIDine (CATAPRES) 0.2 MG tablet Take 0.2 mg by mouth 2 (two) times daily.        . clopidogrel (PLAVIX) 75 MG tablet Take 75 mg by mouth daily.        . colchicine 0.6 MG tablet Take 0.6 mg by mouth daily as needed. For gout       . fenofibrate micronized (LOFIBRA) 134 MG capsule Take 134 mg by mouth daily before breakfast.       . furosemide (LASIX) 80 MG tablet Take 80 mg by mouth 2 (two) times daily.        . hydrALAZINE (APRESOLINE) 25 MG tablet Take 25 mg by mouth daily.      . metoprolol (LOPRESSOR) 50 MG tablet Take 50 mg by mouth 4 (four) times daily.        Marland Kitchen  nisoldipine (SULAR) 20 MG 24 hr tablet Take 20 mg by mouth 2 (two) times daily.       Marland Kitchen omega-3 acid ethyl esters (LOVAZA) 1 G capsule Take 3 g by mouth 2 (two) times daily.        . simvastatin (ZOCOR) 40 MG tablet Take 40 mg by mouth at bedtime.        . Vitamin D, Ergocalciferol, (DRISDOL) 50000 UNITS CAPS Take 50,000 Units by mouth every 7 (seven) days. Take Tuesday         Allergies Penicillins  Family History: No family history on file.  Social History: History   Social History  . Marital Status: Married    Spouse Name: N/A    Number of Children: N/A  . Years of Education: N/A   Occupational History  . Not on file.   Social History Main Topics  . Smoking status: Not on file  . Smokeless  tobacco: Not on file  . Alcohol Use:   . Drug Use:   . Sexually Active:    Other Topics Concern  . Not on file   Social History Narrative  . No narrative on file    Electrocardiogram: NSR rate 59 apical hypertroph lateral T wave inversions  Assessment and Plan

## 2012-01-15 NOTE — Patient Instructions (Signed)
Your physician recommends that you schedule a follow-up appointment in: AS NEEDED Your physician recommends that you continue on your current medications as directed. Please refer to the Current Medication list given to you today. Your physician has requested that you have a dobutamine echocardiogram. For further information please visit [email or web address] Please follow instruction sheet as given. DX PRE OP

## 2012-01-15 NOTE — Assessment & Plan Note (Signed)
Fistual in good working order.  F/U [facility].  Donor is son so surgery can proceed as soon as we clear

## 2012-01-15 NOTE — Assessment & Plan Note (Signed)
No mention of LVOT gradient on [facility] echo will recheck on stress echo.  Mild MR and MIld AR

## 2012-01-15 NOTE — Assessment & Plan Note (Signed)
Thinks he can cut cold [place] before surgery.  Nicotine load is low. 4-5cigs/day

## 2012-01-15 NOTE — Assessment & Plan Note (Signed)
Well controlled.  Continue current medications and low sodium Dash type diet.    

## 2012-01-27 ENCOUNTER — Encounter (HOSPITAL_COMMUNITY): Payer: Self-pay | Admitting: *Deleted

## 2012-01-27 ENCOUNTER — Encounter: Payer: Self-pay | Admitting: Cardiovascular Disease

## 2012-01-27 ENCOUNTER — Ambulatory Visit (HOSPITAL_COMMUNITY): Payer: 59 | Attending: Cardiology

## 2012-01-27 DIAGNOSIS — R011 Cardiac murmur, unspecified: Secondary | ICD-10-CM | POA: Insufficient documentation

## 2012-01-27 DIAGNOSIS — I10 Essential (primary) hypertension: Secondary | ICD-10-CM | POA: Insufficient documentation

## 2012-01-27 DIAGNOSIS — F17200 Nicotine dependence, unspecified, uncomplicated: Secondary | ICD-10-CM | POA: Insufficient documentation

## 2012-01-27 DIAGNOSIS — Z01818 Encounter for other preprocedural examination: Secondary | ICD-10-CM

## 2012-01-27 DIAGNOSIS — R079 Chest pain, unspecified: Secondary | ICD-10-CM | POA: Insufficient documentation

## 2012-01-27 DIAGNOSIS — R9430 Abnormal result of cardiovascular function study, unspecified: Secondary | ICD-10-CM

## 2012-01-29 ENCOUNTER — Encounter: Payer: Self-pay | Admitting: *Deleted

## 2012-02-24 ENCOUNTER — Telehealth: Payer: Self-pay | Admitting: Cardiovascular Disease

## 2012-02-24 NOTE — Telephone Encounter (Signed)
Follow up on previous call:  Per [name], calling back to check on status of cardiac cath. Due to complicated case - kidney transplant.

## 2012-02-24 NOTE — Telephone Encounter (Signed)
PER [name] PT MAY HAVE CATH DONE ON 4-15 OR 22-13 . PT ABLE TO HAVE TRANSPLANT  DONE  ON EITHER 4-18 OR 25 -13 PER DR NISHAN DO CATH IN [facility] ON MON 03-16-12 WITH DR COOPER  UNABLE TO REACH ANYONE IN LAB WILL TRY  LATER .Zack Seal

## 2012-02-24 NOTE — Telephone Encounter (Signed)
New problem:  Per [name], need to look at scheduling patient cardiac cath - kidney transplant.

## 2012-02-26 ENCOUNTER — Telehealth: Payer: Self-pay | Admitting: *Deleted

## 2012-02-26 ENCOUNTER — Other Ambulatory Visit: Payer: Self-pay | Admitting: Cardiovascular Disease

## 2012-02-26 ENCOUNTER — Encounter: Payer: Self-pay | Admitting: *Deleted

## 2012-02-26 DIAGNOSIS — I2510 Atherosclerotic heart disease of native coronary artery without angina pectoris: Secondary | ICD-10-CM

## 2012-02-26 DIAGNOSIS — Z01810 Encounter for preprocedural cardiovascular examination: Secondary | ICD-10-CM

## 2012-02-26 NOTE — Telephone Encounter (Signed)
PT AWARE  [facility] SCHEDULED WITH DR Excell Seltzer ON  03-16-12 AT 7:30 AM LAB ORDERS ENTERED WILL DONE AT [facility] ON  03-12-12

## 2012-02-26 NOTE — Telephone Encounter (Signed)
[name] AWARE OF CATH  APPT  CATH LAB NUMBER GIVEN  SO  SHE CAN GET CATH REPORT  SO PT MAY PROCEED WITH KIDNEY TRANSPLANT LATER IN WEEK./CY

## 2012-02-26 NOTE — Telephone Encounter (Signed)
LEFT MESSAGE  PT IS SCHEDULED FOR [facility] ON 03-16-12 AT 7:30 AM .Zack Seal

## 2012-02-28 ENCOUNTER — Encounter (HOSPITAL_COMMUNITY): Payer: Self-pay | Admitting: Pharmacy Technician

## 2012-03-12 ENCOUNTER — Other Ambulatory Visit: Payer: Self-pay | Admitting: *Deleted

## 2012-03-12 DIAGNOSIS — R9439 Abnormal result of other cardiovascular function study: Secondary | ICD-10-CM

## 2012-03-13 ENCOUNTER — Ambulatory Visit (HOSPITAL_COMMUNITY)
Admission: RE | Admit: 2012-03-13 | Discharge: 2012-03-13 | Disposition: A | Discharge status: Home / Self Care | Payer: 59 | Source: Ambulatory Visit | Attending: Cardiovascular Disease | Admitting: Cardiovascular Disease

## 2012-03-13 ENCOUNTER — Telehealth: Payer: Self-pay | Admitting: Cardiovascular Disease

## 2012-03-13 ENCOUNTER — Other Ambulatory Visit: Payer: Self-pay | Admitting: Cardiovascular Disease

## 2012-03-13 LAB — BASIC METABOLIC PANEL
BUN: 79 mg/dL — ABNORMAL HIGH (ref 6–23)
CO2: 23 mEq/L (ref 19–32)
Calcium: 9.4 mg/dL (ref 8.4–10.5)
Creatinine, Ser: 6.85 mg/dL — ABNORMAL HIGH (ref 0.50–1.35)
GFR calc non Af Amer: 9 mL/min — ABNORMAL LOW (ref >90)
Glucose, Bld: 90 mg/dL (ref 70–99)
Sodium: 139 mEq/L (ref 135–145)

## 2012-03-13 LAB — CBC
MCH: 30.6 pg (ref 26.0–34.0)
MCHC: 35.2 g/dL (ref 30.0–36.0)
MCV: 86.9 fL (ref 78.0–100.0)
Platelets: 159 10*3/uL (ref 150–400)
RBC: 5.04 MIL/uL (ref 4.22–5.81)
RDW: 14 % (ref 11.5–15.5)

## 2012-03-13 LAB — PROTIME-INR: Prothrombin Time: 12.5 seconds (ref 11.6–15.2)

## 2012-03-13 NOTE — Telephone Encounter (Signed)
Per pt request sent RX to have labs drawn for c cath to pt who will have drawn at [facility]--nt

## 2012-03-13 NOTE — Telephone Encounter (Signed)
New msg Pt was calling about cath he has scheduled on Monday. He has questions about meds he needs to stop and he needs order to get lab work done. Please call him back

## 2012-03-16 ENCOUNTER — Encounter (HOSPITAL_BASED_OUTPATIENT_CLINIC_OR_DEPARTMENT_OTHER)
Admission: RE | Disposition: A | Discharge status: Hospice | Payer: Self-pay | Source: Ambulatory Visit | Attending: Cardiovascular Disease

## 2012-03-16 ENCOUNTER — Encounter (HOSPITAL_COMMUNITY)
Admission: RE | Disposition: A | Discharge status: Home / Self Care | Payer: Self-pay | Source: Ambulatory Visit | Attending: Thoracic Surgery (Cardiothoracic Vascular Surgery)

## 2012-03-16 ENCOUNTER — Other Ambulatory Visit: Payer: Self-pay | Admitting: Thoracic Surgery (Cardiothoracic Vascular Surgery)

## 2012-03-16 ENCOUNTER — Encounter (HOSPITAL_COMMUNITY): Payer: Self-pay | Admitting: General Practice

## 2012-03-16 ENCOUNTER — Inpatient Hospital Stay (HOSPITAL_BASED_OUTPATIENT_CLINIC_OR_DEPARTMENT_OTHER)
Admission: RE | Admit: 2012-03-16 | Discharge: 2012-03-16 | Disposition: A | Discharge status: Hospice | Payer: 59 | Source: Ambulatory Visit | Attending: Cardiovascular Disease | Admitting: Cardiovascular Disease

## 2012-03-16 ENCOUNTER — Ambulatory Visit (HOSPITAL_COMMUNITY): Payer: 59

## 2012-03-16 ENCOUNTER — Inpatient Hospital Stay (HOSPITAL_COMMUNITY)
Admission: RE | Admit: 2012-03-16 | Discharge: 2012-03-25 | DRG: 233 | Disposition: A | Discharge status: Home / Self Care | Payer: 59 | Source: Ambulatory Visit | Attending: Thoracic Surgery (Cardiothoracic Vascular Surgery) | Admitting: Thoracic Surgery (Cardiothoracic Vascular Surgery)

## 2012-03-16 DIAGNOSIS — E876 Hypokalemia: Secondary | ICD-10-CM | POA: Diagnosis not present

## 2012-03-16 DIAGNOSIS — I2510 Atherosclerotic heart disease of native coronary artery without angina pectoris: Secondary | ICD-10-CM

## 2012-03-16 DIAGNOSIS — E785 Hyperlipidemia, unspecified: Secondary | ICD-10-CM | POA: Diagnosis present

## 2012-03-16 DIAGNOSIS — D631 Anemia in chronic kidney disease: Secondary | ICD-10-CM | POA: Diagnosis present

## 2012-03-16 DIAGNOSIS — I120 Hypertensive chronic kidney disease with stage 5 chronic kidney disease or end stage renal disease: Secondary | ICD-10-CM | POA: Diagnosis present

## 2012-03-16 DIAGNOSIS — D62 Acute posthemorrhagic anemia: Secondary | ICD-10-CM | POA: Diagnosis not present

## 2012-03-16 DIAGNOSIS — Z01810 Encounter for preprocedural cardiovascular examination: Secondary | ICD-10-CM

## 2012-03-16 DIAGNOSIS — N039 Chronic nephritic syndrome with unspecified morphologic changes: Secondary | ICD-10-CM | POA: Diagnosis present

## 2012-03-16 DIAGNOSIS — N186 End stage renal disease: Secondary | ICD-10-CM | POA: Diagnosis present

## 2012-03-16 DIAGNOSIS — N2581 Secondary hyperparathyroidism of renal origin: Secondary | ICD-10-CM | POA: Diagnosis present

## 2012-03-16 DIAGNOSIS — K560 Paralytic ileus: Secondary | ICD-10-CM | POA: Diagnosis not present

## 2012-03-16 DIAGNOSIS — Z8673 Personal history of transient ischemic attack (TIA), and cerebral infarction without residual deficits: Secondary | ICD-10-CM

## 2012-03-16 DIAGNOSIS — R9431 Abnormal electrocardiogram [ECG] [EKG]: Secondary | ICD-10-CM

## 2012-03-16 DIAGNOSIS — Z951 Presence of aortocoronary bypass graft: Secondary | ICD-10-CM

## 2012-03-16 DIAGNOSIS — Z992 Dependence on renal dialysis: Secondary | ICD-10-CM

## 2012-03-16 DIAGNOSIS — Z01812 Encounter for preprocedural laboratory examination: Secondary | ICD-10-CM

## 2012-03-16 DIAGNOSIS — F17200 Nicotine dependence, unspecified, uncomplicated: Secondary | ICD-10-CM | POA: Diagnosis present

## 2012-03-16 HISTORY — DX: Peripheral vascular disease, unspecified: I73.9

## 2012-03-16 HISTORY — DX: Migraine, unspecified, not intractable, without status migrainosus: G43.909

## 2012-03-16 HISTORY — PX: CARDIAC CATHETERIZATION: SHX172

## 2012-03-16 HISTORY — PX: LEFT HEART CATHETERIZATION WITH CORONARY ANGIOGRAM: SHX5451

## 2012-03-16 LAB — CBC
MCH: 30 pg (ref 26.0–34.0)
MCHC: 33.7 g/dL (ref 30.0–36.0)
Platelets: 128 10*3/uL — ABNORMAL LOW (ref 150–400)
RBC: 4.33 MIL/uL (ref 4.22–5.81)

## 2012-03-16 LAB — CREATININE, SERUM: Creatinine, Ser: 6.86 mg/dL — ABNORMAL HIGH (ref 0.50–1.35)

## 2012-03-16 SURGERY — LEFT HEART CATHETERIZATION WITH CORONARY ANGIOGRAM
Anesthesia: LOCAL

## 2012-03-16 SURGERY — JV LEFT HEART CATHETERIZATION WITH CORONARY ANGIOGRAM
Anesthesia: Moderate Sedation

## 2012-03-16 MED ORDER — NITROGLYCERIN 0.4 MG SL SUBL: 0.4000 mg | SUBLINGUAL_TABLET | SUBLINGUAL | Status: DC | PRN

## 2012-03-16 MED ORDER — ONDANSETRON HCL 4 MG/2ML IJ SOLN
4.0000 mg | Freq: Four times a day (QID) | INTRAMUSCULAR | Status: DC | PRN
  Administered 2012-03-17: 4 mg via INTRAVENOUS
  Filled 2012-03-16: qty 2

## 2012-03-16 MED ORDER — ZOLPIDEM TARTRATE 5 MG PO TABS: 5.0000 mg | ORAL_TABLET | Freq: Every evening | ORAL | Status: DC | PRN

## 2012-03-16 MED ORDER — ONDANSETRON HCL 4 MG/2ML IJ SOLN: 4.0000 mg | Freq: Four times a day (QID) | INTRAMUSCULAR | Status: DC | PRN

## 2012-03-16 MED ORDER — DIAZEPAM 5 MG PO TABS: 5.0000 mg | ORAL_TABLET | ORAL | Status: DC

## 2012-03-16 MED ORDER — ALPRAZOLAM 0.25 MG PO TABS: 0.2500 mg | ORAL_TABLET | Freq: Two times a day (BID) | ORAL | Status: DC | PRN

## 2012-03-16 MED ORDER — SODIUM CHLORIDE 0.9 % IV SOLN: INTRAVENOUS | Status: DC

## 2012-03-16 MED ORDER — ACETAMINOPHEN 325 MG PO TABS: 650.0000 mg | ORAL_TABLET | ORAL | Status: DC | PRN

## 2012-03-16 MED ORDER — NISOLDIPINE ER 17 MG PO TB24
17.0000 mg | ORAL_TABLET | Freq: Two times a day (BID) | ORAL | Status: DC
  Administered 2012-03-16 – 2012-03-18 (×4): 17 mg via ORAL
  Filled 2012-03-16 (×7): qty 1

## 2012-03-16 MED ORDER — SODIUM CHLORIDE 0.9 % IV SOLN: 250.0000 mL | INTRAVENOUS | Status: DC | PRN

## 2012-03-16 MED ORDER — VITAMIN D (ERGOCALCIFEROL) 1.25 MG (50000 UNIT) PO CAPS
50000.0000 [IU] | ORAL_CAPSULE | ORAL | Status: DC
  Filled 2012-03-16: qty 1

## 2012-03-16 MED ORDER — NITROGLYCERIN 0.2 MG/ML ON CALL CATH LAB
INTRAVENOUS | Status: AC
  Filled 2012-03-16: qty 1

## 2012-03-16 MED ORDER — SODIUM CHLORIDE 0.9 % IJ SOLN: 3.0000 mL | Freq: Two times a day (BID) | INTRAMUSCULAR | Status: DC

## 2012-03-16 MED ORDER — CALCITRIOL 0.25 MCG PO CAPS
0.2500 ug | ORAL_CAPSULE | ORAL | Status: DC
  Administered 2012-03-18: 0.25 ug via ORAL
  Filled 2012-03-16 (×2): qty 1

## 2012-03-16 MED ORDER — CLONIDINE HCL 0.2 MG PO TABS
0.2000 mg | ORAL_TABLET | Freq: Two times a day (BID) | ORAL | Status: DC
  Administered 2012-03-16: 0.2 mg via ORAL
  Filled 2012-03-16 (×4): qty 1

## 2012-03-16 MED ORDER — FENOFIBRATE 160 MG PO TABS
160.0000 mg | ORAL_TABLET | Freq: Every day | ORAL | Status: DC
  Administered 2012-03-18: 160 mg via ORAL
  Filled 2012-03-16 (×4): qty 1

## 2012-03-16 MED ORDER — OXYCODONE-ACETAMINOPHEN 5-325 MG PO TABS
2.0000 | ORAL_TABLET | ORAL | Status: DC | PRN
  Administered 2012-03-17: 2 via ORAL
  Filled 2012-03-16: qty 2

## 2012-03-16 MED ORDER — ATORVASTATIN CALCIUM 20 MG PO TABS
20.0000 mg | ORAL_TABLET | Freq: Every day | ORAL | Status: DC
  Administered 2012-03-16 – 2012-03-18 (×2): 20 mg via ORAL
  Filled 2012-03-16 (×4): qty 1

## 2012-03-16 MED ORDER — METOPROLOL TARTRATE 50 MG PO TABS
50.0000 mg | ORAL_TABLET | Freq: Four times a day (QID) | ORAL | Status: DC
  Administered 2012-03-16 – 2012-03-18 (×8): 50 mg via ORAL
  Filled 2012-03-16 (×15): qty 1

## 2012-03-16 MED ORDER — HYDRALAZINE HCL 25 MG PO TABS
25.0000 mg | ORAL_TABLET | Freq: Every day | ORAL | Status: DC
  Administered 2012-03-17 – 2012-03-18 (×2): 25 mg via ORAL
  Filled 2012-03-16 (×4): qty 1

## 2012-03-16 MED ORDER — OMEGA-3-ACID ETHYL ESTERS 1 G PO CAPS
3.0000 g | ORAL_CAPSULE | Freq: Two times a day (BID) | ORAL | Status: DC
  Administered 2012-03-16 – 2012-03-18 (×4): 3 g via ORAL
  Filled 2012-03-16 (×8): qty 3

## 2012-03-16 MED ORDER — MORPHINE SULFATE 2 MG/ML IJ SOLN: 2.0000 mg | INTRAMUSCULAR | Status: DC | PRN

## 2012-03-16 MED ORDER — SODIUM CHLORIDE 0.9 % IJ SOLN: 3.0000 mL | INTRAMUSCULAR | Status: DC | PRN

## 2012-03-16 MED ORDER — FENTANYL CITRATE 0.05 MG/ML IJ SOLN
INTRAMUSCULAR | Status: AC
  Filled 2012-03-16: qty 2

## 2012-03-16 MED ORDER — MIDAZOLAM HCL 2 MG/2ML IJ SOLN
INTRAMUSCULAR | Status: AC
  Filled 2012-03-16: qty 2

## 2012-03-16 MED ORDER — ASPIRIN 81 MG PO CHEW: 324.0000 mg | CHEWABLE_TABLET | ORAL | Status: DC

## 2012-03-16 MED ORDER — SIMVASTATIN 40 MG PO TABS: 40.0000 mg | ORAL_TABLET | Freq: Every day | ORAL | Status: DC

## 2012-03-16 MED ORDER — COLCHICINE 0.6 MG PO TABS
0.6000 mg | ORAL_TABLET | Freq: Every day | ORAL | Status: DC | PRN
  Filled 2012-03-16: qty 1

## 2012-03-16 MED ORDER — ALLOPURINOL 100 MG PO TABS
100.0000 mg | ORAL_TABLET | Freq: Two times a day (BID) | ORAL | Status: DC
  Administered 2012-03-16 – 2012-03-18 (×4): 100 mg via ORAL
  Filled 2012-03-16 (×7): qty 1

## 2012-03-16 MED ORDER — NISOLDIPINE ER 20 MG PO TB24: 20.0000 mg | ORAL_TABLET | Freq: Two times a day (BID) | ORAL | Status: DC

## 2012-03-16 MED ORDER — ENOXAPARIN SODIUM 40 MG/0.4ML ~~LOC~~ SOLN
40.0000 mg | Freq: Every day | SUBCUTANEOUS | Status: DC
  Administered 2012-03-16 – 2012-03-18 (×3): 40 mg via SUBCUTANEOUS
  Filled 2012-03-16 (×4): qty 0.4

## 2012-03-16 MED ORDER — MORPHINE SULFATE 4 MG/ML IJ SOLN
INTRAMUSCULAR | Status: AC
  Administered 2012-03-16: 2 mg
  Filled 2012-03-16: qty 2

## 2012-03-16 MED ORDER — SODIUM CHLORIDE 0.9 % IV SOLN
INTRAVENOUS | Status: DC
  Administered 2012-03-17: 01:00:00 via INTRAVENOUS

## 2012-03-16 MED ORDER — SODIUM CHLORIDE 0.9 % IV SOLN
INTRAVENOUS | Status: DC
  Administered 2012-03-16: 1000 mL via INTRAVENOUS

## 2012-03-16 MED ORDER — HEPARIN (PORCINE) IN NACL 2-0.9 UNIT/ML-% IJ SOLN
INTRAMUSCULAR | Status: AC
  Filled 2012-03-16: qty 2000

## 2012-03-16 MED ORDER — SODIUM CHLORIDE 0.9 % IJ SOLN
3.0000 mL | Freq: Two times a day (BID) | INTRAMUSCULAR | Status: DC
  Administered 2012-03-16: 3 mL via INTRAVENOUS
  Administered 2012-03-16: 18:00:00 via INTRAVENOUS

## 2012-03-16 MED ORDER — LIDOCAINE HCL (PF) 1 % IJ SOLN
INTRAMUSCULAR | Status: AC
  Filled 2012-03-16: qty 30

## 2012-03-16 MED ORDER — DIAZEPAM 5 MG PO TABS
5.0000 mg | ORAL_TABLET | ORAL | Status: AC
  Administered 2012-03-16: 5 mg via ORAL

## 2012-03-16 NOTE — Consult Note (Signed)
CARDIOTHORACIC SURGERY CONSULTATION REPORT  PCP is Mickie Hillier, MD, MD Referring Provider is Charlton Haws, MD   Reason for consultation:  Severe 2 vessel CAD  HPI:  Patient is a 51 year old gentleman from [place] with no previous history of coronary artery disease but risk factors notable for history of hypertension, previous stroke, and end-stage kidney disease. The patient's kidney disease was originally diagnosed at age 33. He has been followed for many years by Dr. Eliott Nine and he recently has been making plans for living related donor kidney transplant using a kidney from his 3 year old son. He underwent a stress test that was abnormal prompting diagnostic cardiac catheterization that was performed earlier today by Dr. Excell Seltzer. He was found to have severe two-vessel coronary artery disease with preserved left ventricular function. Cardiothoracic surgical consultation has been requested.  Past Medical History  Diagnosis Date  . Hypertension   . CAD (coronary artery disease)   . Elevated cholesterol   . Ventricular hypertrophy   . Gout   . Pharyngitis   . Peripheral vascular disease   . Heart murmur   . Parathyroid disorder     "they are careful w/my parathyroid cause I have kidney disease"  . Migraines     "last one 1990's"  . Stroke 1995; 2001    denies residual  . Chronic kidney disease (CKD), stage IV (severe)     "not getting dialysis yet" (03/16/12)    Past Surgical History  Procedure Date  . Cardiac catheterization 03/16/12  . Renal artery stent 2001  . Fracture surgery   . Av fistula placement 2011    left forearm  . Elbow fracture surgery 1972    left  . Hernia repair 09/2009    umbilical  . Inguinal hernia repair 09/2009    bilaterally  . Dental surgery 2008    multiple    History reviewed. No pertinent family history.  Social History History  Substance Use Topics  . Smoking status: Former Smoker -- 0.3 packs/day for 30 years   Types: Cigarettes    Quit date: 03/15/2012  . Smokeless tobacco: Never Used  . Alcohol Use: Yes     03/16/12 "2 mixed drinks/month"    Prior to Admission medications   Medication Sig Start Date End Date Taking? Authorizing Provider  allopurinol (ZYLOPRIM) 100 MG tablet Take 100 mg by mouth 2 (two) times daily.     Yes Historical Provider, MD  calcitRIOL (ROCALTROL) 0.25 MCG capsule Take 0.25 mcg by mouth every Monday, Wednesday, and Friday.    Yes Historical Provider, MD  cloNIDine (CATAPRES) 0.2 MG tablet Take 0.2 mg by mouth 2 (two) times daily.     Yes Historical Provider, MD  fenofibrate micronized (LOFIBRA) 134 MG capsule Take 134 mg by mouth daily before breakfast.    Yes Historical Provider, MD  hydrALAZINE (APRESOLINE) 25 MG tablet Take 25 mg by mouth daily.   Yes Historical Provider, MD  metoprolol (LOPRESSOR) 50 MG tablet Take 50 mg by mouth 4 (four) times daily.     Yes Historical Provider, MD  nisoldipine (SULAR) 20 MG 24 hr tablet Take 20 mg by mouth 2 (two) times daily.    Yes Historical Provider, MD  omega-3 acid ethyl esters (LOVAZA) 1 G capsule Take 3 g by mouth 2 (two) times daily.     Yes Historical Provider, MD  simvastatin (ZOCOR) 40 MG tablet Take 40 mg by mouth at bedtime.     Yes  Historical Provider, MD  colchicine 0.6 MG tablet Take 0.6 mg by mouth daily as needed. For gout     Historical Provider, MD  furosemide (LASIX) 80 MG tablet Take 80 mg by mouth 2 (two) times daily.      Historical Provider, MD  Vitamin D, Ergocalciferol, (DRISDOL) 50000 UNITS CAPS Take 50,000 Units by mouth every 7 (seven) days. Take Tuesday     Historical Provider, MD    Current Facility-Administered Medications  Medication Dose Route Frequency Provider Last Rate Last Dose  . 0.9 %  sodium chloride infusion   Intravenous Continuous [name], MD 100 mL/hr at 03/16/12 0946    . 0.9 %  sodium chloride infusion  250 mL Intravenous PRN Joline Salt Barrett, PA      . acetaminophen (TYLENOL)  tablet 650 mg  650 mg Oral Q4H PRN [name], MD      . allopurinol (ZYLOPRIM) tablet 100 mg  100 mg Oral BID Joline Salt Barrett, PA      . ALPRAZolam Prudy Feeler) tablet 0.25 mg  0.25 mg Oral BID PRN Joline Salt Barrett, PA      . atorvastatin (LIPITOR) tablet 20 mg  20 mg Oral q1800 [name], MD   20 mg at 03/16/12 1806  . calcitRIOL (ROCALTROL) capsule 0.25 mcg  0.25 mcg Oral Q M,W,F Rhonda G Barrett, PA      . cloNIDine (CATAPRES) tablet 0.2 mg  0.2 mg Oral BID Rhonda G Barrett, PA      . colchicine tablet 0.6 mg  0.6 mg Oral Daily PRN Joline Salt Barrett, PA      . diazepam (VALIUM) tablet 5 mg  5 mg Oral On Call [name], MD   5 mg at 03/16/12 0728  . enoxaparin (LOVENOX) injection 40 mg  40 mg Subcutaneous Daily Rhonda G Barrett, PA   40 mg at 03/16/12 1600  . fenofibrate tablet 160 mg  160 mg Oral Daily Rhonda G Barrett, PA      . fentaNYL (SUBLIMAZE) 0.05 MG/ML injection           . heparin 2-0.9 UNIT/ML-% infusion           . hydrALAZINE (APRESOLINE) tablet 25 mg  25 mg Oral Daily Rhonda G Barrett, PA      . lidocaine (XYLOCAINE) 1 % injection           . metoprolol (LOPRESSOR) tablet 50 mg  50 mg Oral QID Joline Salt Barrett, PA   50 mg at 03/16/12 1806  . midazolam (VERSED) 2 MG/2ML injection           . morphine 2 MG/ML injection 2 mg  2 mg Intravenous Q2H PRN [name], MD      . morphine 4 MG/ML injection        2 mg at 03/16/12 1418  . nisoldipine (SULAR) 24 hr tablet 17 mg  17 mg Oral BID [name], MD      . nitroGLYCERIN (NITROSTAT) SL tablet 0.4 mg  0.4 mg Sublingual Q5 Min x 3 PRN Rhonda G Barrett, PA      . nitroGLYCERIN (NTG ON-CALL) 0.2 mg/mL injection           . omega-3 acid ethyl esters (LOVAZA) capsule 3 g  3 g Oral BID Joline Salt Barrett, PA      . ondansetron (ZOFRAN) injection 4 mg  4 mg Intravenous Q6H PRN [name], MD      . oxyCODONE-acetaminophen (PERCOCET) 5-325 MG  per tablet 2 tablet  2 tablet Oral Q4H PRN [name], MD      . sodium  chloride 0.9 % injection 3 mL  3 mL Intravenous Q12H Rhonda G Barrett, PA      . sodium chloride 0.9 % injection 3 mL  3 mL Intravenous PRN Darrol Jump, PA      . Vitamin D (Ergocalciferol) (DRISDOL) capsule 50,000 Units  50,000 Units Oral Q7 days Joline Salt Barrett, PA      . zolpidem (AMBIEN) tablet 5 mg  5 mg Oral QHS PRN Darrol Jump, PA      . DISCONTD: 0.9 %  sodium chloride infusion  250 mL Intravenous PRN [name], MD      . DISCONTD: 0.9 %  sodium chloride infusion   Intravenous Continuous [name], MD 75 mL/hr at 03/16/12 0728 1,000 mL at 03/16/12 0728  . DISCONTD: acetaminophen (TYLENOL) tablet 650 mg  650 mg Oral Q4H PRN Darrol Jump, PA      . DISCONTD: nisoldipine (SULAR) 24 hr tablet 20 mg  20 mg Oral BID Darrol Jump, PA      . DISCONTD: ondansetron (ZOFRAN) injection 4 mg  4 mg Intravenous Q6H PRN Darrol Jump, PA      . DISCONTD: simvastatin (ZOCOR) tablet 40 mg  40 mg Oral QHS Rhonda G Barrett, PA      . DISCONTD: sodium chloride 0.9 % injection 3 mL  3 mL Intravenous Q12H [name], MD      . DISCONTD: sodium chloride 0.9 % injection 3 mL  3 mL Intravenous PRN [name], MD       Facility-Administered Medications Ordered in Other Encounters  Medication Dose Route Frequency Provider Last Rate Last Dose  . DISCONTD: 0.9 %  sodium chloride infusion  250 mL Intravenous PRN Wendall Stade, MD      . DISCONTD: 0.9 %  sodium chloride infusion   Intravenous Continuous Wendall Stade, MD      . DISCONTD: aspirin chewable tablet 324 mg  324 mg Oral Pre-Cath Wendall Stade, MD      . DISCONTD: diazepam (VALIUM) tablet 5 mg  5 mg Oral On Call Wendall Stade, MD      . DISCONTD: sodium chloride 0.9 % injection 3 mL  3 mL Intravenous Q12H Wendall Stade, MD      . DISCONTD: sodium chloride 0.9 % injection 3 mL  3 mL Intravenous PRN Wendall Stade, MD        Allergies  Allergen Reactions  . Penicillins Itching and Rash    Review of  Systems:  General:  decreased appetite, normal energy   Respiratory:  no cough, no wheezing, no hemoptysis, no pain with inspiration or cough, no shortness of breath   Cardiac:   no chest pain or tightness, mild exertional SOB only with fairly strenuous exertion, no resting SOB, no PND, no orthopnea, mild chronic bilateral LE edema, no palpitations, no syncope  GI:   no difficulty swallowing, no hematochezia, no hematemesis, no melena, no constipation, no diarrhea   GU:   no dysuria, no urgency, no frequency   Musculoskeletal: no arthritis, no arthralgia   Vascular:  no pain suggestive of claudication   Neuro:   no symptoms suggestive of TIA's, no seizures, no headaches, no peripheral neuropathy   Endocrine:  Negative   HEENT:  no loose teeth or painful teeth,  no recent vision changes  Psych:  no anxiety, no depression    Physical Exam:   BP 117/74  Pulse 63  Resp 18  SpO2 97%  General:    well-appearing  HEENT:  Unremarkable   Neck:   no JVD, no bruits, no adenopathy   Chest:   clear to auscultation, symmetrical breath sounds, no wheezes, no rhonchi   CV:   RRR, no murmur   Abdomen:  soft, non-tender, no masses   Extremities:  warm, well-perfused, pulses diminished in ankles, palpable thrill left forearm  Rectal/GU  Deferred  Neuro:   Grossly non-focal and symmetrical throughout  Skin:   Clean and dry, no rashes, no breakdown  Diagnostic Tests:  Stress Echocardiography  Patient: Kiet, Geer MR #: [id number] Study Date: 01/27/2012 ------------------------------------------------- Indications: Abnormal stress test 794.30. Evaluate LVOT gradient. ----------------------------------------------------------- History: PMH: Acquired from the patient and from the patient's chart. Murmur. Renal disease. Risk factors: Current tobacco use. Hypertension.  ------------------------------------------------------------ Study Conclusions  Stress ECG conclusions: The stress ECG was  non-diagnostic due to resting ST/T wave abnormality.  Impressions:  - Normal study after pharmacologic stress.  ------------------------------------------------------------ Labs, prior tests, procedures, and surgery: Echocardiography (January 2013). The study demonstratedsevere LV apical hypertrophy. The mitral valve showed mild regurgitation. EF was 60%.  Stress nuclear imaging (January 2013). EF was 51%. Mild to moderate photopenia in mid-cavity to apical regions of the anteroseptum, inferoseptum, and inferior walls. Dobutamine. Stress echocardiography. 2D. Height: Height: 172.7cm. Height: 68in. Weight: Weight: 85.5kg. Weight: 188lb. Body mass index: BMI: 28.6kg/m^2. Body surface area: BSA: 2.74m^2. Blood pressure: 136/91. Patient status: Outpatient.  ------------------------------------------------------------ ------------------------------------------------------------ Stress results: Maximal heart rate during stress was 151bpm (90% of maximal predicted heart rate). The maximal predicted heart rate was 172bpm.The target heart rate was achieved. The heart rate response to stress was normal. There was a normal resting blood pressure. Normal blood pressure response to dobutamine. The rate-pressure product for the peak heart rate and blood pressure was Hg/min. Stress induced chest pain which resolved spontaneously.  ------------------------------------------------------------ Stress ECG: The resting EKG prior to dobutamine stress shows widespread T wave inversion in the inferior and anterolateral leads. With dobutamine stress the inferior T wave inversion becomes upright, then becomes inverted again during recovery. There are no new areas of ST depression with stress.There are frequent PACs and PVCs throughout the study. The stress ECG was non-diagnostic due to resting ST/T wave  abnormality.  ------------------------------------------------------------ Baseline:  - The estimated LV ejection fraction was 65%. - Normal wall motion; no LV regional wall motion abnormalities. Low dose:  - LV global systolic function was normal and appropriately augmented from baseline. The estimated LV ejection fraction was 70%. - Normal wall motion; no LV regional wall motion abnormalities. Peak stress:  - LV global systolic function was vigorous. The estimated LV ejection fraction was 75%. - Normal wall motion; no LV regional wall motion abnormalities. Recovery:  - LV global systolic function was normal. The estimated LV ejection fraction was 70%. - Normal wall motion; no LV regional wall motion abnormalities.  ------------------------------------------------------------ Prepared and Electronically Authenticated by  Cassell Clement, MD [id number]   Cardiac Catheterization Procedure Note  Name: [name]  MRN: [id number]  DOB: Dec 15, 1960  Procedure: Left Heart Cath, Selective Coronary Angiography  Indication: Preoperative evaluation for kidney transplant. Patient with multiple cardiac risk factors and abnormal CT functional test.  Procedural details: The right groin was prepped, draped, and anesthetized with 1% lidocaine. Using modified Seldinger technique, a 5 French sheath was introduced into the right femoral artery.  Standard Judkins catheters were used for coronary angiography. Left ventriculography was deferred. LV pressure was recorded with a pigtail catheter. Catheter exchanges were performed over a guidewire. There were no immediate procedural complications. The patient was transferred to the post catheterization recovery area for further monitoring.  Procedural Findings:  Hemodynamics:  AO 114/74  LV 109/29  Coronary angiography:  Coronary dominance: right  Left mainstem: Widely patent with no obstructive disease. There is minor  irregularity noted.  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 This is all before the first diagonal origin. I would estimate the degree stenosis at 80%. This involves a long segment of diffuse disease. The first diagonal has mild diffuse disease. 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 Beyond bridging segment the LAD is patent to the left ventricular apex and it wraps around the apex.  Left circumflex (LCx): The left circumflex is a large, dominant vessel. There is mild to moderate obstructive disease in the proximal vessel with 50% stenosis. The first OM has severe disease with 80% stenosis. This is difficult to lay out because it overlaps the AV groove circumflex but it clearly has significant obstructive disease over a long segment. The distal circumflex gives off a left posterolateral branch and the left PDA branch, both branches are patent with moderate diffuse disease.  Right coronary artery (RCA): The RCA is a nondominant vessel. There is severe stenosis in the proximal and mid vessel. The vessel gives off an RV marginal branch and really has no other significant tributary branches.  Left ventriculography: Deferred because of advanced renal disease  Final Conclusions:  1. 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 Moderate to severe left stenosis  3. Elevated left ventricular end-diastolic pressure  Recommendations: Complex situation in this patient with multivessel CAD involving the proximal LAD who requires renal transplant. Will review with his primary cardiologist, Dr. Eden Emms as well as the transplant team.  [name]  03/16/2012, 8:47 AM    Impression:  Severe two-vessel coronary artery disease with normal left ventricular systolic function involving high-grade proximal stenosis of the left anterior descending coronary  artery as well as a large obtuse marginal branch of the left circumflex coronary artery. The patient has been for the most part asymptomatic with respect to his underlying coronary artery disease, although he does report some mild progressive fatigue and shortness of breath with relatively strenuous physical exertion. I agree that the patient would likely best be treated with surgical revascularization to decrease his risk of myocardial infarction and afford substantially improve long-term survival. Risks of surgery should be fairly low although somewhat elevated do to the patient's history of severe chronic kidney disease and remote history of previous stroke. He clearly will be at fairly high-risk for the development of acute on chronic renal failure that might require dialysis following surgery. Alternatives such as PCI and stenting of the left anterior descending coronary artery and the obtuse marginal branch of left circumflex coronary artery could be considered, but these may be with somewhat attenuated long-term survival benefit as well as the additional complication of need for long-term anticoagulation with antiplatelet therapy that might complicate proceeding with planned kidney transplantation.   Plan:  I discussed matters at length with the patient and his wife here in this hospital room this evening. The indications, risks, and potential benefits of coronary artery bypass grafting have been discussed  in detail. We discussed surgical alternatives including the possibility of using bilateral internal mammary artery harvested and grafting to afford the possibility for improved long-term disease-free survival. As an alternative we discussed the possibility of off pump coronary artery bypass grafting in an attempt to minimize the likelihood that he will need dialysis therapy postoperatively. However, I do not feel that bilateral internal mammary artery harvested and grafting would likely be performed  without the use of cardioplegia bypass.  The patient and his wife understand and accept all potential associated risks of surgery including but not limited to risk of death, stroke, myocardial infarction, congestive heart failure, respiratory failure, pneumonia, bleeding requiring blood transfusion, arrhythmia, infection, or late recurrence of symptomatic coronary artery disease or myocardial infarction. He also could understand the very significant possibility that he may need to go on dialysis postoperatively. We could tentatively plan to proceed with surgery later this week unless it is felt that a more prolonged amount of time following catheterization might be advantageous to allow the patient's renal function to recover to baseline following catheterization in effort to minimize the likelihood that he will need dialysis therapy. If that is the case then we will plan surgery in 2 weeks. If such delay is felt likely not to be beneficial, we will plan to proceed with surgery this coming Thursday, April 18. We will discuss this further with Dr Eliott Nine and the Nephrology team tomorrow.  All of his questions been addressed.    Salvatore Decent. Cornelius Moras, MD 03/16/2012 7:56 PM

## 2012-03-16 NOTE — H&P (Signed)
Pt seen/evaluated. Agree with H and P update per Theodore Demark, PA-C as documented. Pt for cardiac cath with limited contrast as part of pre-transplant evaluation. His questions were answered to his satisfaction.  [name] 03/16/2012 9:05 PM

## 2012-03-16 NOTE — Op Note (Signed)
   Cardiac Catheterization Procedure Note  Name: [name] MRN: [id number] DOB: [birth year]  Procedure: Left Heart Cath, Selective Coronary Angiography  Indication: Preoperative evaluation for kidney transplant. Patient with multiple cardiac risk factors and abnormal CT functional test.   Procedural details: The right groin was prepped, draped, and anesthetized with 1% lidocaine. Using modified Seldinger technique, a 5 French sheath was introduced into the right femoral artery. Standard Judkins catheters were used for coronary angiography. Left ventriculography was deferred. LV pressure was recorded with a pigtail catheter. Catheter exchanges were performed over a guidewire. There were no immediate procedural complications. The patient was transferred to the post catheterization recovery area for further monitoring.  Procedural Findings: Hemodynamics:  AO 114/74 LV 109/29   Coronary angiography: Coronary dominance: right  Left mainstem: Widely patent with no obstructive disease. There is minor irregularity noted.  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 This is all before the first diagonal origin. I would estimate the degree stenosis at 80%. This involves a long segment of diffuse disease. The first diagonal has mild diffuse disease. 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 Beyond bridging segment the LAD is patent to the left ventricular apex and it wraps around the apex.  Left circumflex (LCx): The left circumflex is a large, dominant vessel. There is mild to moderate obstructive disease in the proximal vessel with 50% stenosis. The first OM has severe disease with 80% stenosis. This is difficult to lay out because it overlaps the AV groove circumflex but it clearly has significant obstructive disease over a long segment. The distal circumflex gives off a  left posterolateral branch and the left PDA branch, both branches are patent with moderate diffuse disease.  Right coronary artery (RCA): The RCA is a nondominant vessel. There is severe stenosis in the proximal and mid vessel. The vessel gives off an RV marginal branch and really has no other significant tributary branches.  Left ventriculography: Deferred because of advanced renal disease  Final Conclusions:   1. 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 Moderate to severe left stenosis 3. Elevated left ventricular end-diastolic pressure  Recommendations: Complex situation in this patient with multivessel CAD involving the proximal LAD who requires renal transplant. Will review with his primary cardiologist, Dr. Eden Emms as well as the transplant team.  [name] 03/16/2012, 8:47 AM

## 2012-03-16 NOTE — H&P (Signed)
History and Physical  Patient ID: [name] Patient ID: JALON SQUIER MRN: [id number], DOB/AGE: Jan 14, 1961 51 y.o. Date of Encounter: 03/16/2012  Primary Physician: Mickie Hillier, MD, MD Primary Cardiologist:PN  Chief Complaint:  Pre-op eval   HPI: Mr Tolen is a 51 year old male with no previous Hx CAD. He was seen by Dr Eden Emms in February for pre-op evaluation prior to kidney transplant. The kidney will come from his son. He previously had an echocardiogram. He never gets chest pain. No history of palpitatons, syncope or edema. His ECG is abnormal and cardiac cath was requested for further eval. He is here today for the procedure.   Past Medical History  Diagnosis Date  . Renal disorder   . Hypertension   . CAD (coronary artery disease)   . Stroke   . Elevated cholesterol   . Ventricular hypertrophy   . Gout   . Pharyngitis      Surgical History:  Past Surgical History  Procedure Date  . Hernia repair    Fistula for future HD   . Elbow surgery      I have reviewed the patient's current medications. Prescriptions prior to admission  Medication Sig Dispense Refill  . allopurinol (ZYLOPRIM) 100 MG tablet Take 100 mg by mouth 2 (two) times daily.        . calcitRIOL (ROCALTROL) 0.25 MCG capsule Take 0.25 mcg by mouth every Monday, Wednesday, and Friday.       . cloNIDine (CATAPRES) 0.2 MG tablet Take 0.2 mg by mouth 2 (two) times daily.        . fenofibrate micronized (LOFIBRA) 134 MG capsule Take 134 mg by mouth daily before breakfast.       . hydrALAZINE (APRESOLINE) 25 MG tablet Take 25 mg by mouth daily.      . metoprolol (LOPRESSOR) 50 MG tablet Take 50 mg by mouth 4 (four) times daily.        . nisoldipine (SULAR) 20 MG 24 hr tablet Take 20 mg by mouth 2 (two) times daily.       Marland Kitchen omega-3 acid ethyl esters (LOVAZA) 1 G capsule Take 3 g by mouth 2 (two) times daily.        . simvastatin (ZOCOR) 40 MG tablet Take 40 mg by mouth at bedtime.        Marland Kitchen aspirin  325 MG tablet Take 325 mg by mouth daily.        . clopidogrel (PLAVIX) 75 MG tablet Take 75 mg by mouth daily.        . colchicine 0.6 MG tablet Take 0.6 mg by mouth daily as needed. For gout       . furosemide (LASIX) 80 MG tablet Take 80 mg by mouth 2 (two) times daily.        . Vitamin D, Ergocalciferol, (DRISDOL) 50000 UNITS CAPS Take 50,000 Units by mouth every 7 (seven) days. Take Tuesday         Allergies:  Allergies  Allergen Reactions  . Penicillins Rash    History   Social History  . Marital Status: Married    Spouse Name: N/A    Number of Children: N/A  . Years of Education: N/A   Occupational History  . 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 Smoking status: Not on file  . Smokeless tobacco: Current User  . Alcohol Use: Not on file  . Drug Use: Not on file  .  Sexually Active: Not on file      No family history on file.  Review of Systems:  He is active around the house and yard with no significant difficulties.No recent illnesses, no bleeding problems.  Full 14-point review of systems otherwise negative except as noted above.   Physical Exam: There were no vitals taken for this visit. General: Well developed, well nourished, male in no acute distress. Head: Normocephalic, atraumatic, sclera non-icteric, no xanthomas, nares are without discharge. Dentition: good Neck:no carotid bruits. JVD not elevated. No thyromegally Lungs: Good expansion bilaterally. bilateral   without wheezes or rhonchi. No Rales Heart: Regular rate and rhythm with S1 S2.  No S3 or S4.  No murmur, no rubs, or gallops appreciated. Abdomen: Soft, non-tender, non-distended with normoactive bowel sounds. No hepatomegaly. No rebound/guarding. No obvious abdominal masses. Msk:  Strength and tone appear normal for age. No joint deformities or effusions, no spine or costo-vertebral angle tenderness. Extremities: No clubbing or cyanosis. No edema.  Distal pedal pulses are 2+ and  equal bilaterally. No femoral bruits Neuro: Alert and oriented X 3. Moves all extremities spontaneously. No focal deficits noted. Psych:  Responds to questions appropriately with a normal affect. Skin: No rashes or lesions noted  Labs:   Lab Results  Component Value Date   WBC 7.4 03/13/2012   HGB 15.4 03/13/2012   HCT 43.8 03/13/2012   MCV 86.9 03/13/2012   PLT 159 03/13/2012    Basename 03/13/12 1117  INR 0.91    Lab 03/13/12 1117  NA 139  K 3.7  CL 101  CO2 23  BUN 79*  CREATININE 6.85*  CALCIUM 9.4  PROT --  BILITOT --  ALKPHOS --  ALT --  AST --  GLUCOSE 90     Dobutamine Stress Echo: 01/29/2012 Baseline: - The estimated LV ejection fraction was 65%. - Normal wall motion; no LV regional wall motion abnormalities. Low dose: - LV global systolic function was normal and appropriately augmented from baseline. The estimated LV ejection fraction was 70%. - Normal wall motion; no LV regional wall motion abnormalities. Peak stress: - LV global systolic function was vigorous. The estimated LV ejection fraction was 75%. - Normal wall motion; no LV regional wall motion abnormalities. Recovery: - LV global systolic function was normal. The estimated LV ejection fraction was 70%. - Normal wall motion; no LV regional wall motion abnormalities. Conclusions: Stress ECG conclusions: The stress ECG was non-diagnostic due to resting ST/T wave abnormality. Impressions: - Normal study after pharmacologic stress.     Echo: Done at [facility]: Moderate asymetric LVH 19mm EF 55-60%. Severe apical hypertrophy mild MR trace TR with no evidence of pulmonary hypertension. Myovue 1/29 showed mild to moderate "photopenia in mid cavity to apical regions of the anteroseptum, inferoseptum and inferior walls. EF 51%.    ECG: 16-Mar-2012 07:37:16  Sinus rhythm with Premature supraventricular complexes Possible Left atrial enlargement Left axis deviation Left ventricular hypertrophy with  repolarization abnormality Prolonged QT Abnormal ECG 67mm/s 60mm/mV 100Hz  8.0.1 12SL 241 HD CID: 1 Referred by: MICHAEL COOPER Unconfirmed Vent. rate 63 BPM PR interval 148 ms QRS duration 114 ms QT/QTc 494/505 ms P-R-T axes 26 -33 160  ASSESSMENT AND PLAN:  Pre-op Eval: Abnormal ECG, cath today to eval for CAD. Further clinical decisions based on the results.  Signed,  Bjorn Loser  PA-C 03/16/2012, 8:22 AM

## 2012-03-16 NOTE — Consult Note (Signed)
Reason for Consult:advanced CKD Referring Physician: Deny Chevez Haley is an 51 y.o. male.  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  We were asked to follow along as pt is very near ESRD and is at an increased risk for contrast induced nephropathy and possible initiation of dialysis.  Trend in Creatinine: Creatinine, Ser  Date/Time Value Range Status  03/16/2012  2:14 PM 6.86* 0.50-1.35 (mg/dL) Final  1/61/0960 45:40 AM 6.85* 0.50-1.35 (mg/dL) Final  98/12/1912 78:29 AM 5.30* 0.50-1.35 (mg/dL) Final  56/21/3086 57:84 PM 4.30* 0.4-1.5 (mg/dL) Final    PMH:   Past Medical History  Diagnosis Date  . Renal disorder   . Hypertension   . CAD (coronary artery disease)   . Stroke   . Elevated cholesterol   . Ventricular hypertrophy   . Gout   . Pharyngitis     PSH:   Past Surgical History  Procedure Date  . Hernia repair   . Elbow surgery     Allergies:  Allergies  Allergen Reactions  . Penicillins Rash    Medications:   Prior to Admission medications   Medication Sig Start Date End Date Taking? Authorizing Provider  allopurinol (ZYLOPRIM) 100 MG tablet Take 100 mg by mouth 2 (two) times daily.     Yes Historical Provider, MD  calcitRIOL (ROCALTROL) 0.25 MCG capsule Take 0.25 mcg by mouth every Monday, Wednesday, and Friday.    Yes Historical Provider, MD  cloNIDine (CATAPRES) 0.2 MG tablet Take 0.2 mg by mouth 2 (two) times daily.     Yes Historical Provider, MD  fenofibrate micronized (LOFIBRA) 134 MG capsule Take 134 mg by mouth daily before breakfast.    Yes Historical Provider, MD  hydrALAZINE (APRESOLINE) 25 MG tablet Take 25 mg by mouth daily.   Yes Historical Provider, MD  metoprolol (LOPRESSOR) 50 MG tablet Take 50 mg by mouth 4 (four) times daily.     Yes Historical Provider, MD  nisoldipine  (SULAR) 20 MG 24 hr tablet Take 20 mg by mouth 2 (two) times daily.    Yes Historical Provider, MD  omega-3 acid ethyl esters (LOVAZA) 1 G capsule Take 3 g by mouth 2 (two) times daily.     Yes Historical Provider, MD  simvastatin (ZOCOR) 40 MG tablet Take 40 mg by mouth at bedtime.     Yes Historical Provider, MD  colchicine 0.6 MG tablet Take 0.6 mg by mouth daily as needed. For gout     Historical Provider, MD  furosemide (LASIX) 80 MG tablet Take 80 mg by mouth 2 (two) times daily.      Historical Provider, MD  Vitamin D, Ergocalciferol, (DRISDOL) 50000 UNITS CAPS Take 50,000 Units by mouth every 7 (seven) days. Take Tuesday     Historical Provider, MD    Discontinued Meds:   Medications Discontinued During This Encounter  Medication Reason  . aspirin 325 MG tablet Stop Taking at Discharge  . clopidogrel (PLAVIX) 75 MG tablet Stop Taking at Discharge  . 0.9 %  sodium chloride infusion   . sodium chloride 0.9 % injection 3 mL Patient Transfer  . sodium chloride 0.9 % injection 3 mL Patient Transfer  . 0.9 %  sodium chloride infusion Patient Transfer  . nisoldipine (SULAR) 24 hr tablet 20 mg Formulary change  .  simvastatin (ZOCOR) tablet 40 mg Formulary change  . acetaminophen (TYLENOL) tablet 650 mg Duplicate  . ondansetron (ZOFRAN) injection 4 mg Duplicate    Social History:  does not have a smoking history on file. He uses smokeless tobacco. His alcohol and drug histories not on file. 4 children from previous marriage, one step-daughter  Family History:  No family history on file. adopted  A comprehensive review of systems was negative. Weight change:   Intake/Output Summary (Last 24 hours) at 03/16/12 1531 Last data filed at 03/16/12 1100  Gross per 24 hour  Intake    240 ml  Output      0 ml  Net    240 ml    General appearance: alert, cooperative and no distress Head: Normocephalic, without obvious abnormality, atraumatic Eyes: negative findings: lids and lashes  normal, conjunctivae and sclerae normal, corneas clear and pupils equal, round, reactive to light and accomodation Neck: no adenopathy, no carotid bruit, no JVD, supple, symmetrical, trachea midline and thyroid not enlarged, symmetric, no tenderness/mass/nodules Resp: clear to auscultation bilaterally Cardio: regular rate and rhythm, S1, S2 normal, no murmur, click, rub or gallop GI: soft, non-tender; bowel sounds normal; no masses,  no organomegaly Extremities: edema 1+ bilateral lower extremeties and left cimino AVF +T/B Neurologic: Grossly normal  Labs: Basic Metabolic Panel:  Lab 03/16/12 1610 03/13/12 1117  NA -- 139  K -- 3.7  CL -- 101  CO2 -- 23  GLUCOSE -- 90  BUN -- 79*  CREATININE 6.86* 6.85*  ALB -- --  CALCIUM -- 9.4  PHOS -- --   Liver Function Tests: No results found for this basename: AST:3,ALT:3,ALKPHOS:3,BILITOT:3,PROT:3,ALBUMIN:3 in the last 168 hours No results found for this basename: LIPASE:3,AMYLASE:3 in the last 168 hours No results found for this basename: AMMONIA:3 in the last 168 hours CBC:  Lab 03/16/12 1414 03/13/12 1117  WBC 6.1 7.4  NEUTROABS -- --  HGB 13.0 15.4  HCT 38.6* 43.8  MCV 89.1 86.9  PLT 128* 159   PT/INR: @labrcntip (inr:5) Cardiac Enzymes: No results found for this basename: CKTOTAL:5,CKMB:5,CKMBINDEX:5,TROPONINI:5 in the last 168 hours CBG: No results found for this basename: GLUCAP:5 in the last 168 hours  Iron Studies: No results found for this basename: IRON:30,TIBC:30,TRANSFERRIN:30,FERRITIN:30 in the last 168 hours  Xrays/Other Studies: No results found.   Assessment/Plan: 1.  2 vessel CAD- will be evaluated by cardiothoracic surgery regarding possible CABG 2. CKD stage 5- will follow renal function post cath.  No uremic symptoms but will likely require RRT if CABG or repeat cath prior to LRD kidney transplant.  Will follow 3. Vascular access- left AVF >29 year old 4. HTN- stable 5. Dyslipidemia- on  meds 6. Secondary HPTH- on vit d 7. dispo- awaiting CT surgery eval   Helen Winterhalter A 03/16/2012, 3:31 PM

## 2012-03-17 ENCOUNTER — Inpatient Hospital Stay (HOSPITAL_COMMUNITY): Payer: 59

## 2012-03-17 ENCOUNTER — Ambulatory Visit (HOSPITAL_COMMUNITY): Payer: 59

## 2012-03-17 DIAGNOSIS — N186 End stage renal disease: Secondary | ICD-10-CM

## 2012-03-17 DIAGNOSIS — I2510 Atherosclerotic heart disease of native coronary artery without angina pectoris: Principal | ICD-10-CM

## 2012-03-17 LAB — RENAL FUNCTION PANEL
BUN: 76 mg/dL — ABNORMAL HIGH (ref 6–23)
CO2: 20 mEq/L (ref 19–32)
Chloride: 105 mEq/L (ref 96–112)
Creatinine, Ser: 6.36 mg/dL — ABNORMAL HIGH (ref 0.50–1.35)
Glucose, Bld: 113 mg/dL — ABNORMAL HIGH (ref 70–99)
Potassium: 3.6 mEq/L (ref 3.5–5.1)

## 2012-03-17 LAB — PULMONARY FUNCTION TEST

## 2012-03-17 LAB — CBC
HCT: 40.5 % (ref 39.0–52.0)
Hemoglobin: 13.5 g/dL (ref 13.0–17.0)
MCV: 89.8 fL (ref 78.0–100.0)
WBC: 8 10*3/uL (ref 4.0–10.5)

## 2012-03-17 LAB — TYPE AND SCREEN: ABO/RH(D): B POS

## 2012-03-17 MED ORDER — CHLORHEXIDINE GLUCONATE 4 % EX LIQD
60.0000 mL | Freq: Once | CUTANEOUS | Status: DC
  Filled 2012-03-17: qty 60

## 2012-03-17 MED ORDER — ALBUTEROL SULFATE (5 MG/ML) 0.5% IN NEBU
2.5000 mg | INHALATION_SOLUTION | Freq: Once | RESPIRATORY_TRACT | Status: AC
  Administered 2012-03-17: 2.5 mg via RESPIRATORY_TRACT

## 2012-03-17 MED ORDER — CHLORHEXIDINE GLUCONATE 4 % EX LIQD
60.0000 mL | Freq: Once | CUTANEOUS | Status: AC
  Administered 2012-03-19: 4 via TOPICAL
  Filled 2012-03-17 (×3): qty 60

## 2012-03-17 MED ORDER — BISACODYL 5 MG PO TBEC: 5.0000 mg | DELAYED_RELEASE_TABLET | Freq: Once | ORAL | Status: DC

## 2012-03-17 MED ORDER — TEMAZEPAM 15 MG PO CAPS: 15.0000 mg | ORAL_CAPSULE | Freq: Once | ORAL | Status: AC | PRN

## 2012-03-17 MED ORDER — CLONIDINE HCL 0.1 MG PO TABS
0.1000 mg | ORAL_TABLET | Freq: Every day | ORAL | Status: DC | PRN
  Filled 2012-03-17: qty 1

## 2012-03-17 MED ORDER — CLONIDINE HCL 0.2 MG PO TABS
0.2000 mg | ORAL_TABLET | Freq: Every day | ORAL | Status: DC
  Administered 2012-03-17 – 2012-03-18 (×2): 0.2 mg via ORAL
  Filled 2012-03-17 (×3): qty 1

## 2012-03-17 MED ORDER — CHLORHEXIDINE GLUCONATE 4 % EX LIQD
60.0000 mL | Freq: Once | CUTANEOUS | Status: AC
  Administered 2012-03-19: 4 via TOPICAL
  Filled 2012-03-17: qty 60

## 2012-03-17 MED ORDER — HEART ATTACK BOUNCING BOOK
Freq: Once | Status: AC
  Administered 2012-03-17: 12:00:00
  Filled 2012-03-17: qty 1

## 2012-03-17 MED ORDER — METOPROLOL TARTRATE 12.5 MG HALF TABLET
12.5000 mg | ORAL_TABLET | Freq: Once | ORAL | Status: DC
  Filled 2012-03-17: qty 1

## 2012-03-17 NOTE — Progress Notes (Signed)
Patient ID: [name], male   DOB: 18-Dec-1960, 51 y.o.   MRN: [id number]    Subjective:  Denies SSCP, palpitations or Dyspnea Urinating well  Objective:  Filed Vitals:   03/17/12 0006 03/17/12 0440 03/17/12 0819 03/17/12 1208  BP: 126/70 113/71 128/84   Pulse: 56 60 52 51  Temp: 97.7 F (36.5 C) 97.7 F (36.5 C) 97.4 F (36.3 C) 97.7 F (36.5 C)  TempSrc: Oral Oral Oral Oral  Resp: 17 19 18 18   SpO2: 95% 91% 96% 96%    Intake/Output from previous day:  Intake/Output Summary (Last 24 hours) at 03/17/12 1353 Last data filed at 03/17/12 0700  Gross per 24 hour  Intake   1720 ml  Output   1735 ml  Net    -15 ml    Physical Exam:  Affect appropriate Healthy:  appears stated age HEENT: normal Neck supple with no adenopathy JVP normal no bruits no thyromegaly Lungs clear with no wheezing and good diaphragmatic motion Heart:  S1/S2 no murmur, no rub, gallop or click PMI normal Abdomen: benighn, BS positve, no tenderness, no AAA no bruit.  No HSM or HJR Distal pulses intact with no bruits No edema Neuro non-focal Skin warm and dry No muscular weakness Cath site well healed Fistula LUE with thrill  Lab Results: Basic Metabolic Panel:  Basename 03/17/12 0540 03/16/12 1414  NA 138 --  K 3.6 --  CL 105 --  CO2 20 --  GLUCOSE 113* --  BUN 76* --  CREATININE 6.36* 6.86*  CALCIUM 8.1* --  MG -- --  PHOS 4.8* --   Liver Function Tests:  Basename 03/17/12 0540  AST --  ALT --  ALKPHOS --  BILITOT --  PROT --  ALBUMIN 2.8*   Imaging: Dg Chest Port 1 View  03/16/2012  *RADIOLOGY REPORT*  Clinical Data: Preoperative assessment, history tobacco use, hypertension, coronary artery disease, stroke  PORTABLE CHEST - 1 VIEW  Comparison: Portable exam 1500 hours compared to10/19/2010  Findings: Enlargement of cardiac silhouette. Pulmonary vascular congestion. Tortuous aorta. Lungs clear. No pleural effusion or pneumothorax. No acute osseous findings.  IMPRESSION:  Enlargement of cardiac silhouette with pulmonary vascular congestion. No acute infiltrate.  Original Report Authenticated By: Lollie Marrow, M.D.    Cardiac Studies:   Telemetry: NSR no arrhythmia  Echo:   Medications:     . allopurinol  100 mg Oral BID  . atorvastatin  20 mg Oral q1800  . calcitRIOL  0.25 mcg Oral Q M,W,F  . cloNIDine  0.2 mg Oral QHS  . enoxaparin  40 mg Subcutaneous Daily  . fenofibrate  160 mg Oral Daily  . heart attack bouncing book   Does not apply Once  . hydrALAZINE  25 mg Oral Daily  . metoprolol  50 mg Oral QID  . morphine      . nisoldipine  17 mg Oral BID  . omega-3 acid ethyl esters  3 g Oral BID  . Vitamin D (Ergocalciferol)  50,000 Units Oral Q7 days  . DISCONTD: cloNIDine  0.2 mg Oral BID  . DISCONTD: sodium chloride  3 mL Intravenous Q12H       . DISCONTD: sodium chloride 100 mL/hr at 03/17/12 0101    Assessment/Plan:  CAD:  Discussed at length with Dr Excell Seltzer and Cornelius Moras.  CABG best long term option.   CRF:  Cr stable but very elevated.  Not uremic.  Urinating post cath.  Suspect ok to proceed With surgery Thursday  and proceed wit dialysis post op Smoking:  Discussed cessation and importance regarding vascular disease including transplanted kidney  Spoke with Dr Mortimer Fries at [facility] and transplant can take place as soon as 3 months post CABG  Charlton Haws 03/17/2012, 1:53 PM

## 2012-03-17 NOTE — Progress Notes (Signed)
Patient ID: [name], male   DOB: October 12, 1961, 51 y.o.   MRN: [id number] S:had an episode of N/V this am, feels better O:BP 128/84  Pulse 52  Temp(Src) 97.4 F (36.3 C) (Oral)  Resp 18  SpO2 96%  Intake/Output Summary (Last 24 hours) at 03/17/12 0912 Last data filed at 03/17/12 0700  Gross per 24 hour  Intake   1960 ml  Output   1735 ml  Net    225 ml   Weight change:  Gen:WD WN WM in NAD CVS:RRR no rub Resp:CTA NWG:NFAOZH Ext:1+edema   Lab 03/17/12 0540 03/16/12 1414 03/13/12 1117  NA 138 -- 139  K 3.6 -- 3.7  CL 105 -- 101  CO2 20 -- 23  GLUCOSE 113* -- 90  BUN 76* -- 79*  CREATININE 6.36* 6.86* 6.85*  ALB -- -- --  CALCIUM 8.1* -- 9.4  PHOS 4.8* -- --  AST -- -- --  ALT -- -- --   Liver Function Tests:  Lab 03/17/12 0540  AST --  ALT --  ALKPHOS --  BILITOT --  PROT --  ALBUMIN 2.8*   No results found for this basename: LIPASE:3,AMYLASE:3 in the last 168 hours No results found for this basename: AMMONIA:3 in the last 168 hours CBC:  Lab 03/17/12 0540 03/16/12 1414 03/13/12 1117  WBC 8.0 6.1 7.4  NEUTROABS -- -- --  HGB 13.5 13.0 15.4  HCT 40.5 38.6* 43.8  MCV 89.8 89.1 86.9  PLT 143* 128* 159   Cardiac Enzymes: No results found for this basename: CKTOTAL:5,CKMB:5,CKMBINDEX:5,TROPONINI:5 in the last 168 hours CBG: No results found for this basename: GLUCAP:5 in the last 168 hours  Iron Studies: No results found for this basename: IRON,TIBC,TRANSFERRIN,FERRITIN in the last 72 hours Studies/Results: Dg Chest Port 1 View  03/16/2012  *RADIOLOGY REPORT*  Clinical Data: Preoperative assessment, history tobacco use, hypertension, coronary artery disease, stroke  PORTABLE CHEST - 1 VIEW  Comparison: Portable exam 1500 hours compared to10/19/2010  Findings: Enlargement of cardiac silhouette. Pulmonary vascular congestion. Tortuous aorta. Lungs clear. No pleural effusion or pneumothorax. No acute osseous findings.  IMPRESSION: Enlargement of cardiac  silhouette with pulmonary vascular congestion. No acute infiltrate.  Original Report Authenticated By: Lollie Marrow, M.D.      . allopurinol  100 mg Oral BID  . atorvastatin  20 mg Oral q1800  . calcitRIOL  0.25 mcg Oral Q M,W,F  . cloNIDine  0.2 mg Oral BID  . enoxaparin  40 mg Subcutaneous Daily  . fenofibrate  160 mg Oral Daily  . hydrALAZINE  25 mg Oral Daily  . metoprolol  50 mg Oral QID  . morphine      . nisoldipine  17 mg Oral BID  . omega-3 acid ethyl esters  3 g Oral BID  . sodium chloride  3 mL Intravenous Q12H  . Vitamin D (Ergocalciferol)  50,000 Units Oral Q7 days  . DISCONTD: nisoldipine  20 mg Oral BID  . DISCONTD: simvastatin  40 mg Oral QHS  . DISCONTD: sodium chloride  3 mL Intravenous Q12H    BMET    Component Value Date/Time   NA 138 03/17/2012 0540   K 3.6 03/17/2012 0540   CL 105 03/17/2012 0540   CO2 20 03/17/2012 0540   GLUCOSE 113* 03/17/2012 0540   BUN 76* 03/17/2012 0540   CREATININE 6.36* 03/17/2012 0540   CALCIUM 8.1* 03/17/2012 0540   GFRNONAA 9* 03/17/2012 0540   GFRAA 11* 03/17/2012 0540  CBC    Component Value Date/Time   WBC 8.0 03/17/2012 0540   RBC 4.51 03/17/2012 0540   HGB 13.5 03/17/2012 0540   HCT 40.5 03/17/2012 0540   PLT 143* 03/17/2012 0540   MCV 89.8 03/17/2012 0540   MCH 29.9 03/17/2012 0540   MCHC 33.3 03/17/2012 0540   RDW 14.4 03/17/2012 0540   LYMPHSABS 0.3* 11/05/2011 1111   MONOABS 0.4 11/05/2011 1111   EOSABS 0.1 11/05/2011 1111   BASOSABS 0.0 11/05/2011 1111     Assessment/Plan:  1. 2 vessel CAD- Evaluated by cardiothoracic surgery regarding possible CABG.  Discussed risk/benefits of putting off CABG for renal preservation, however given his advanced CKD, doubt waiting for surgery would have a significant impact on renal survival.  Pt wants to proceed with surgery while he is already here and agree.  We did discuss the high likelihood that he would require HD post procedure and he acknowledges this and agrees to  proceed. 2. CKD stage 5- will follow renal function post cath. No uremic symptoms but will likely require RRT if CABG or repeat cath prior to LRD kidney transplant. Will follow 3. Vascular access- left AVF >42 year old but not quite as mature/developed.  Will ask [facility] to eval while he is here before CABG 4. HTN- stable 5. Dyslipidemia- on meds 6. Secondary HPTH- on vit d 7. N/V this am- likely due to narcotics on empty stomach as renal function has improved 8. dispo- awaiting CT surgery eval  Kaydra Borgen A

## 2012-03-17 NOTE — Plan of Care (Signed)
Problem: Food- and Nutrition-Related Knowledge Deficit (NB-1.1) Goal: Nutrition education Formal process to instruct or train a patient/client in a skill or to impart knowledge to help patients/clients voluntarily manage or modify food choices and eating behavior to maintain or improve health.  Outcome: Completed/Met Date Met:  03/17/12 RD consulted for diet education.  Reviewed labs and meds. Provided kidney disease food pyramid and provided information on advanced kidney stage recommendations. Discussed limiting protein and high potassium/phosphorus foods. Also provided handouts on low sodium diet. Also briefly covered information on nutrition changes necessary if HD initiated.   Pt is eating well. Current weight is 83.9 kg.  Re-consult RD for any additional nutrition needs.  [name] Pager#: [phone]

## 2012-03-17 NOTE — Progress Notes (Signed)
Rt hand iv infusing without difficulty but hand/fingers swollen and tender from unsuccessful lab draw to dorsal middle finger yesterday.  IV removed, IVF moved to RAC IV.  Hand elevated on pillow, percocet given for pain.

## 2012-03-17 NOTE — Consult Note (Signed)
Pt smokes 1/4 ppd and say he quit since his admission. He's quit cold [place] before. Verbalizes understanding of risk factors. Pt is in action stage. His plans are to quit cold [place] after d/c. Encouraged pt to quit completely. Referred to 1-800 quit now for f/u and support. Discussed oral fixation substitutes, second hand smoke and in home smoking policy. Reviewed and gave pt Written education/contact information.

## 2012-03-17 NOTE — Progress Notes (Signed)
Clinical Social Work Department BRIEF PSYCHOSOCIAL ASSESSMENT 03/17/2012  Patient:  [name], [name]     Account Number:  [id number]     Admit date:  03/16/2012  Clinical Social Worker:  Hulan Fray  Date/Time:  03/17/2012 09:48 AM  Referred by:  RN  Date Referred:  03/16/2012 Referred for  Advanced Directives   Other Referral:   Interview type:  Other - See comment Other interview type:   Patient, wife, [name].    PSYCHOSOCIAL DATA Living Status:  WIFE Admitted from facility:   Level of care:   Primary support name:  [name] Primary support relationship to patient:  SPOUSE Degree of support available:   Supportive    CURRENT CONCERNS Current Concerns  Other - See comment   Other Concerns:   Advance directive request.    SOCIAL WORK ASSESSMENT / PLAN Clinical Social Worker received referral for advance directive request. Patient lives with wife, [name] who was at bedside. Patient stated that his wife was interested in patient having the advance directive documents. CSW provided patient and wife with the advance directive packet and MOST form to be filled out with physician. CSW informed them that the documents can be notarized during this hospital admission if it is completed. [name] stated that they know a notary who can notarize the forms. Patient and wife did not have any further questions regarding packet. CSW will sign off as social work intervention is no longer needed.   Assessment/plan status:  No Further Intervention Required Other assessment/ plan:   Information/referral to community resources:   Engineer, production and MOST form.    PATIENT'S/FAMILY'S RESPONSE TO PLAN OF CARE: Patient and wife were appreciative of receiving the advance directive packet and CSW's assistance.

## 2012-03-17 NOTE — Consult Note (Signed)
[facility] Consult  Reson for Consult:  ESRD; has non-mature left radio-cephalic AVF Referring Physician:   Coladonato  History of Present Illness: This is a 51 y.o. male here for who has a Hx of CAD in need of CABG, HTN, and CKD stage 4-5.  He is planning on having a kidney transplant from his son, but he had an abnormal EKG and stress test during his pre-op evaluation.  He is found to need CABG and will likely need HD after surgery.  He did have have left radio-cephalic AVF on 01/12/10 by Dr. Darrick Penna.   Past Medical History  Diagnosis Date  . Hypertension   . CAD (coronary artery disease)   . Elevated cholesterol   . Ventricular hypertrophy   . Gout   . Pharyngitis   . Peripheral vascular disease   . Heart murmur   . Parathyroid disorder     "they are careful w/my parathyroid cause I have kidney disease"  . Migraines     "last one 1990's"  . Stroke 1995; 2001    denies residual  . Chronic kidney disease (CKD), stage IV (severe)     "not getting dialysis yet" (03/16/12)   Past Surgical History  Procedure Date  . Cardiac catheterization 03/16/12  . Renal artery stent 2001  . Fracture surgery   . Av fistula placement 2011    left forearm  . Elbow fracture surgery 1972    left  . Hernia repair 09/2009    umbilical  . Inguinal hernia repair 09/2009    bilaterally  . Dental surgery 2008    multiple    Allergies  Allergen Reactions  . Penicillins Itching and Rash    Prior to Admission medications   Medication Sig Start Date End Date Taking? Authorizing Provider  allopurinol (ZYLOPRIM) 100 MG tablet Take 100 mg by mouth 2 (two) times daily.     Yes Historical Provider, MD  calcitRIOL (ROCALTROL) 0.25 MCG capsule Take 0.25 mcg by mouth every Monday, Wednesday, and Friday.    Yes Historical Provider, MD  cloNIDine (CATAPRES) 0.2 MG tablet Take 0.2 mg by mouth 2 (two) times daily.     Yes Historical Provider, MD  fenofibrate micronized (LOFIBRA) 134 MG  capsule Take 134 mg by mouth daily before breakfast.    Yes Historical Provider, MD  hydrALAZINE (APRESOLINE) 25 MG tablet Take 25 mg by mouth daily.   Yes Historical Provider, MD  metoprolol (LOPRESSOR) 50 MG tablet Take 50 mg by mouth 4 (four) times daily.     Yes Historical Provider, MD  nisoldipine (SULAR) 20 MG 24 hr tablet Take 20 mg by mouth 2 (two) times daily.    Yes Historical Provider, MD  omega-3 acid ethyl esters (LOVAZA) 1 G capsule Take 3 g by mouth 2 (two) times daily.     Yes Historical Provider, MD  simvastatin (ZOCOR) 40 MG tablet Take 40 mg by mouth at bedtime.     Yes Historical Provider, MD  colchicine 0.6 MG tablet Take 0.6 mg by mouth daily as needed. For gout     Historical Provider, MD  furosemide (LASIX) 80 MG tablet Take 80 mg by mouth 2 (two) times daily.      Historical Provider, MD  Vitamin D, Ergocalciferol, (DRISDOL) 50000 UNITS CAPS Take 50,000 Units by mouth every 7 (seven) days. Take Tuesday     Historical Provider, MD    History   Social History  . Marital Status: Married  Spouse Name: N/A    Number of Children: N/A  . Years of Education: N/A   Occupational History  . Not on file.   Social History Main Topics  . Smoking status: Former Smoker -- 0.3 packs/day for 30 years    Types: Cigarettes    Quit date: 03/15/2012  . Smokeless tobacco: Never Used  . Alcohol Use: Yes     03/16/12 "2 mixed drinks/month"  . Drug Use: No  . Sexually Active: Yes   Other Topics Concern  . Not on file   Social History Narrative  . No narrative on file    History reviewed. No pertinent family history.  ROS: [x]  Positive   [ ]  Negative   [ ]  All sytems reviewed and are negative  Cardiovascular: [] chest pain; [] chest pressure; [] palpitations; [] SOB lying flat; [] DOE; [] pain in legs with walking; [] pain in legs when lying flat; [] Hx of DVT; [] Hx phlebitis; [] swelling in legs; [] varicose veins; [x]  CAD Pulmonary: [] productive cough; [] asthma;  [] wheezing Neurologic: [x] Hx CVA in 2001-no residual;  [] weakness in arms or legs; [] numbness in arms or legs; [] difficulty in speaking or slurred speech; [] temporary loss of vision in one eye; [] dizziness; Hematologic:  [] bleeding problems; [] clots easily GI:  [] vomiting blood; []  blood in stool; [] PUD GU: []  Dysuria; [] hematuria Psychiatric:  [] Hx major depression Integumentary:  [] rashes; [] ulcers Constitutional:  [] fever; [] chills Extremities:  Minor numbness in his left thumb "like it is going to sleep"   Physical Examination  Filed Vitals:   03/17/12 0819  BP: 128/84  Pulse: 52  Temp: 97.4 F (36.3 C)  Resp: 18    There is no height or weight on file to calculate BMI.  General:  WDWN in NAD Gait: Not observed HENT: WNL Eyes: Pupils equal Pulmonary: normal non-labored breathing , without Rales, rhonchi,  wheezing Cardiac: RRR, without  Murmurs, rubs or gallops; No carotid bruits Abdomen: soft, NT, no masses Skin: no rashes, ulcers noted Vascular Exam/Pulses:+good thrill and bruit in left radio-cephalic AVF, however, not matured; grip is equal bilaterally and sensation in tact.  + palpable bilateral radial pulses. Extremities without ischemic changes, no Gangrene , no cellulitis; no open wounds; +BLE edema Musculoskeletal: no muscle wasting or atrophy  Neurologic: A&O X 3; Appropriate Affect ; SENSATION: normal; MOTOR FUNCTION:  moving all extremities equally. Speech is fluent/normal  Non-Invasive Vascular Imaging: 03/17/12:  Carotid Findings: No evidence of internal carotid artery stenosis bilaterally. Bilateral antegrade vertebral artery flow.   ASSESSMENT/PLAN: This is a 51 y.o. male in need of CABG that will likely require post operative HD.  He does have a Hx of left radio cephalic AVF 2/11.   There is a good thrill and bruit in his left radio-cephalic AVF, however, it is small and not maturing well.   We will start by getting an ultrasound of the AVF to check for  size and if there are any side branches that can be ligated.   Newton Pigg, PA-C [facility] [phone]  AGREE WITH ABOVE Left r-c fistula is patent with a good thrill distally.  In the mid forearm, it becomes somewhat difficult to palpate, and then is again palpable near the elbow.  I suspect he has either a mid forearm stenosis or branch.  I would like to have this first evaluated with ultrasound, and pending the results of this, proceed with fistulogram or surgery pending the results  Durene Cal

## 2012-03-17 NOTE — Progress Notes (Signed)
PFT completed. Unconfirmed results place in Progress Notes in Shadow Chart. 

## 2012-03-17 NOTE — Progress Notes (Signed)
   CARDIOTHORACIC SURGERY PROGRESS NOTE  1 Day Post-Op  S/P Procedure(s) (LRB): LEFT HEART CATHETERIZATION WITH CORONARY ANGIOGRAM (N/A)  Subjective: Nauseated this morning.  Feels fine now.  Objective: Vital signs in last 24 hours: Temp:  [97.4 F (36.3 C)-97.9 F (36.6 C)] 97.7 F (36.5 C) (04/16 1208) Pulse Rate:  [51-60] 51  (04/16 1208) Cardiac Rhythm:  [-]  Resp:  [17-20] 18  (04/16 1208) BP: (113-132)/(70-85) 128/84 mmHg (04/16 0819) SpO2:  [91 %-98 %] 96 % (04/16 1208)  Physical Exam:  Rhythm:   sinus  Breath sounds: clear  Heart sounds:  RRR  Incisions:  n/a  Abdomen:  soft  Extremities:  warm   Intake/Output from previous day: 04/15 0701 - 04/16 0700 In: 1960 [P.O.:760; I.V.:1200] Out: 1735 [Urine:1735] Intake/Output this shift: Total I/O In: 120 [P.O.:120] Out: 400 [Urine:400]  Lab Results:  Basename 03/17/12 0540 03/16/12 1414  WBC 8.0 6.1  HGB 13.5 13.0  HCT 40.5 38.6*  PLT 143* 128*   BMET:  Basename 03/17/12 0540 03/16/12 1414  NA 138 --  K 3.6 --  CL 105 --  CO2 20 --  GLUCOSE 113* --  BUN 76* --  CREATININE 6.36* 6.86*  CALCIUM 8.1* --    CBG (last 3)  No results found for this basename: GLUCAP:3 in the last 72 hours PT/INR:  No results found for this basename: LABPROT,INR in the last 72 hours  CXR:  N/A  Assessment/Plan: S/P Procedure(s) (LRB): LEFT HEART CATHETERIZATION WITH CORONARY ANGIOGRAM (N/A)  Stable post cath.  Discussed options regarding timing with patient and Dr Arrie Aran.    We tentatively plan CABG on Thursday.  [name] 03/17/2012 3:39 PM

## 2012-03-17 NOTE — Progress Notes (Addendum)
Pre-op Cardiac Surgery  Carotid Findings:  No evidence of internal carotid artery stenosis bilaterally. Bilateral antegrade vertebral artery flow.  Elpidio Galea RDMS, RDCS     Upper Extremity Right Left  Brachial Pressures 131 tri HD access  Radial Waveforms tri   Ulnar Waveforms tri   Palmar Arch (Allen's Test) Within normal limits       Lower  Extremity Right Left  Dorsalis Pedis    Anterior Tibial 165 tri 139 mono  Posterior Tibial 169 tri 156 tri  Ankle/Brachial Indices 1.29 1.19   Terance Hart, RVT 03/18/2012 3:20 PM

## 2012-03-18 DIAGNOSIS — Z01810 Encounter for preprocedural cardiovascular examination: Secondary | ICD-10-CM

## 2012-03-18 DIAGNOSIS — I2510 Atherosclerotic heart disease of native coronary artery without angina pectoris: Secondary | ICD-10-CM

## 2012-03-18 LAB — BLOOD GAS, ARTERIAL
Acid-base deficit: 2.4 mmol/L — ABNORMAL HIGH (ref 0.0–2.0)
O2 Saturation: 94.1 %
Patient temperature: 98.6
TCO2: 21.9 mmol/L (ref 0–100)
pH, Arterial: 7.451 — ABNORMAL HIGH (ref 7.350–7.450)

## 2012-03-18 LAB — RENAL FUNCTION PANEL
CO2: 22 mEq/L (ref 19–32)
Calcium: 8.9 mg/dL (ref 8.4–10.5)
Chloride: 102 mEq/L (ref 96–112)
GFR calc Af Amer: 10 mL/min — ABNORMAL LOW (ref >90)
Glucose, Bld: 106 mg/dL — ABNORMAL HIGH (ref 70–99)
Sodium: 137 mEq/L (ref 135–145)

## 2012-03-18 LAB — CBC
Hemoglobin: 14.1 g/dL (ref 13.0–17.0)
MCH: 29.8 pg (ref 26.0–34.0)
RBC: 4.73 MIL/uL (ref 4.22–5.81)
WBC: 10.4 10*3/uL (ref 4.0–10.5)

## 2012-03-18 LAB — URINE MICROSCOPIC-ADD ON

## 2012-03-18 LAB — URINALYSIS, ROUTINE W REFLEX MICROSCOPIC
Glucose, UA: 100 mg/dL — AB
Ketones, ur: NEGATIVE mg/dL
Leukocytes, UA: NEGATIVE
Nitrite: NEGATIVE
Protein, ur: >300 mg/dL — AB

## 2012-03-18 MED ORDER — MAGNESIUM SULFATE 50 % IJ SOLN
40.0000 meq | INTRAMUSCULAR | Status: DC
  Filled 2012-03-18: qty 10

## 2012-03-18 MED ORDER — PHENYLEPHRINE HCL 10 MG/ML IJ SOLN
30.0000 ug/min | INTRAVENOUS | Status: AC
  Administered 2012-03-19: 10 ug/min via INTRAVENOUS
  Filled 2012-03-18: qty 2

## 2012-03-18 MED ORDER — TRANEXAMIC ACID 100 MG/ML IV SOLN
1.5000 mg/kg/h | INTRAVENOUS | Status: AC
  Administered 2012-03-19: 1.5 mg/kg/h via INTRAVENOUS
  Filled 2012-03-18: qty 25

## 2012-03-18 MED ORDER — POTASSIUM CHLORIDE 20 MEQ/15ML (10%) PO LIQD
40.0000 meq | Freq: Once | ORAL | Status: AC
  Administered 2012-03-18: 40 meq via ORAL
  Filled 2012-03-18: qty 30

## 2012-03-18 MED ORDER — DOPAMINE-DEXTROSE 3.2-5 MG/ML-% IV SOLN
2.0000 ug/kg/min | INTRAVENOUS | Status: AC
  Administered 2012-03-19: 3 ug/kg/min via INTRAVENOUS
  Filled 2012-03-18: qty 250

## 2012-03-18 MED ORDER — NITROGLYCERIN IN D5W 200-5 MCG/ML-% IV SOLN
2.0000 ug/min | INTRAVENOUS | Status: AC
  Administered 2012-03-19: 10 ug/min via INTRAVENOUS
  Filled 2012-03-18: qty 250

## 2012-03-18 MED ORDER — SODIUM CHLORIDE 0.9 % IV SOLN
INTRAVENOUS | Status: AC
  Administered 2012-03-19: 1.1 [IU]/h via INTRAVENOUS
  Filled 2012-03-18: qty 1

## 2012-03-18 MED ORDER — VANCOMYCIN HCL 1000 MG IV SOLR
1250.0000 mg | INTRAVENOUS | Status: AC
  Administered 2012-03-19: 1250 mg via INTRAVENOUS
  Filled 2012-03-18 (×2): qty 1250

## 2012-03-18 MED ORDER — POTASSIUM CHLORIDE 2 MEQ/ML IV SOLN
80.0000 meq | INTRAVENOUS | Status: DC
  Filled 2012-03-18: qty 40

## 2012-03-18 MED ORDER — TRANEXAMIC ACID (OHS) PUMP PRIME SOLUTION
2.0000 mg/kg | INTRAVENOUS | Status: DC
  Filled 2012-03-18: qty 1.68

## 2012-03-18 MED ORDER — SODIUM BICARBONATE 8.4 % IV SOLN
INTRAVENOUS | Status: AC
  Administered 2012-03-19: 10:00:00
  Filled 2012-03-18: qty 2.5

## 2012-03-18 MED ORDER — ASPIRIN 325 MG PO TABS
325.0000 mg | ORAL_TABLET | Freq: Every day | ORAL | Status: DC
  Administered 2012-03-18: 325 mg via ORAL
  Filled 2012-03-18 (×2): qty 1

## 2012-03-18 MED ORDER — DEXMEDETOMIDINE HCL 100 MCG/ML IV SOLN
0.1000 ug/kg/h | INTRAVENOUS | Status: AC
  Administered 2012-03-19: .2 ug/kg/h via INTRAVENOUS
  Filled 2012-03-18: qty 4

## 2012-03-18 MED ORDER — TRANEXAMIC ACID (OHS) BOLUS VIA INFUSION
15.0000 mg/kg | INTRAVENOUS | Status: AC
  Administered 2012-03-19: 1258.5 mg via INTRAVENOUS
  Filled 2012-03-18: qty 1259

## 2012-03-18 MED ORDER — MOXIFLOXACIN HCL IN NACL 400 MG/250ML IV SOLN
400.0000 mg | INTRAVENOUS | Status: AC
  Administered 2012-03-19: 400 mg via INTRAVENOUS
  Filled 2012-03-18 (×2): qty 250

## 2012-03-18 MED ORDER — EPINEPHRINE HCL 1 MG/ML IJ SOLN
0.5000 ug/min | INTRAVENOUS | Status: DC
  Filled 2012-03-18: qty 4

## 2012-03-18 NOTE — Progress Notes (Signed)
Patient ID: [name], male   DOB: [birth year], 51 y.o.   MRN: [id number] S:feels well, no N/V O:BP 107/63  Pulse 67  Temp(Src) 98.2 F (36.8 C) (Oral)  Resp 18  Ht 5\' 8"  (1.727 m)  Wt 83.9 kg (184 lb 15.5 oz)  BMI 28.12 kg/m2  SpO2 92%  Intake/Output Summary (Last 24 hours) at 03/18/12 0904 Last data filed at 03/18/12 0500  Gross per 24 hour  Intake    960 ml  Output   2250 ml  Net  -1290 ml   Weight change:  Gen:WD WN WM in NAD CVS:RRR Resp:CTA WUJ:WJXBJY Ext:1+edema, left AVF +T/B   Lab 03/18/12 0509 03/17/12 0540 03/16/12 1414 03/13/12 1117  NA 137 138 -- 139  K 3.4* 3.6 -- 3.7  CL 102 105 -- 101  CO2 22 20 -- 23  GLUCOSE 106* 113* -- 90  BUN 70* 76* -- 79*  CREATININE 6.71* 6.36* 6.86* 6.85*  ALB -- -- -- --  CALCIUM 8.9 8.1* -- 9.4  PHOS 5.4* 4.8* -- --  AST -- -- -- --  ALT -- -- -- --   Liver Function Tests:  Lab 03/18/12 0509 03/17/12 0540  AST -- --  ALT -- --  ALKPHOS -- --  BILITOT -- --  PROT -- --  ALBUMIN 2.9* 2.8*   No results found for this basename: LIPASE:3,AMYLASE:3 in the last 168 hours No results found for this basename: AMMONIA:3 in the last 168 hours CBC:  Lab 03/18/12 0509 03/17/12 0540 03/16/12 1414 03/13/12 1117  WBC 10.4 8.0 6.1 --  NEUTROABS -- -- -- --  HGB 14.1 13.5 13.0 --  HCT 41.8 40.5 38.6* --  MCV 88.4 89.8 89.1 86.9  PLT 129* 143* 128* --   Cardiac Enzymes: No results found for this basename: CKTOTAL:5,CKMB:5,CKMBINDEX:5,TROPONINI:5 in the last 168 hours CBG: No results found for this basename: GLUCAP:5 in the last 168 hours  Iron Studies: No results found for this basename: IRON,TIBC,TRANSFERRIN,FERRITIN in the last 72 hours Studies/Results: Dg Chest 2 View  03/17/2012  *RADIOLOGY REPORT*  Clinical Data: Preop for CABG  CHEST - 2 VIEW  Comparison: Portable chest x-ray of 03/16/2012  Findings: There is cardiomegaly present.  There is some indistinctness of the perihilar vasculature which may indicate mild  edema.  There may be Haley tiny left effusion present as well.  No bony abnormality is seen.  IMPRESSION: Cardiomegaly and probable mild edema.  Small left effusion.  Original Report Authenticated By: Juline Patch, M.D.   Dg Chest Port 1 View  03/16/2012  *RADIOLOGY REPORT*  Clinical Data: Preoperative assessment, history tobacco use, hypertension, coronary artery disease, stroke  PORTABLE CHEST - 1 VIEW  Comparison: Portable exam 1500 hours compared to10/19/2010  Findings: Enlargement of cardiac silhouette. Pulmonary vascular congestion. Tortuous aorta. Lungs clear. No pleural effusion or pneumothorax. No acute osseous findings.  IMPRESSION: Enlargement of cardiac silhouette with pulmonary vascular congestion. No acute infiltrate.  Original Report Authenticated By: Lollie Marrow, M.D.      . albuterol  2.5 mg Nebulization Once  . allopurinol  100 mg Oral BID  . atorvastatin  20 mg Oral q1800  . bisacodyl  5 mg Oral Once  . calcitRIOL  0.25 mcg Oral Q M,W,F  . chlorhexidine  60 mL Topical Once  . chlorhexidine  60 mL Topical Once  . cloNIDine  0.2 mg Oral QHS  . enoxaparin  40 mg Subcutaneous Daily  . fenofibrate  160 mg Oral Daily  .  heart attack bouncing book   Does not apply Once  . hydrALAZINE  25 mg Oral Daily  . metoprolol  50 mg Oral QID  . metoprolol tartrate  12.5 mg Oral Once  . nisoldipine  17 mg Oral BID  . omega-3 acid ethyl esters  3 g Oral BID  . Vitamin D (Ergocalciferol)  50,000 Units Oral Q7 days  . DISCONTD: chlorhexidine  60 mL Topical Once  . DISCONTD: chlorhexidine  60 mL Topical Once  . DISCONTD: chlorhexidine  60 mL Topical Once  . DISCONTD: chlorhexidine  60 mL Topical Once  . DISCONTD: cloNIDine  0.2 mg Oral BID  . DISCONTD: sodium chloride  3 mL Intravenous Q12H    BMET    Component Value Date/Time   NA 137 03/18/2012 0509   K 3.4* 03/18/2012 0509   CL 102 03/18/2012 0509   CO2 22 03/18/2012 0509   GLUCOSE 106* 03/18/2012 0509   BUN 70* 03/18/2012 0509    CREATININE 6.71* 03/18/2012 0509   CALCIUM 8.9 03/18/2012 0509   GFRNONAA 9* 03/18/2012 0509   GFRAA 10* 03/18/2012 0509   CBC    Component Value Date/Time   WBC 10.4 03/18/2012 0509   RBC 4.73 03/18/2012 0509   HGB 14.1 03/18/2012 0509   HCT 41.8 03/18/2012 0509   PLT 129* 03/18/2012 0509   MCV 88.4 03/18/2012 0509   MCH 29.8 03/18/2012 0509   MCHC 33.7 03/18/2012 0509   RDW 14.2 03/18/2012 0509   LYMPHSABS 0.3* 11/05/2011 1111   MONOABS 0.4 11/05/2011 1111   EOSABS 0.1 11/05/2011 1111   BASOSABS 0.0 11/05/2011 1111     Assessment/Plan:  1. 2 vessel CAD- Evaluated by cardiothoracic surgery regarding possible CABG. Plan for CABG tomorrow.  We did discuss the high likelihood that he would require HD post procedure and he acknowledges this and agrees to proceed. 2. CKD stage 5- will follow renal function post cath. No uremic symptoms but will likely require RRT if CABG or repeat cath prior to LRD kidney transplant. Will follow 3. Hypokalemia- will replete orally 4. Vascular access- left AVF >32 year old but not quite as mature/developed. Appreciate [facility]' input.  Plan to eval with [place] but should be able to use if needed. 5. HTN- stable 6. Dyslipidemia- on meds 7. 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 Dispo- awaiting CT surgery eval [name]

## 2012-03-18 NOTE — Progress Notes (Signed)
VASCULAR LAB PRELIMINARY  PRELIMINARY  PRELIMINARY  PRELIMINARY   Duplex of left forearm AVF completed.    Preliminary report:   Fistula patent.  One branch in the mid forearm.  Terance Hart, RVT 03/18/2012, 3:14 PM

## 2012-03-18 NOTE — Progress Notes (Signed)
UR Completed. Simmons, Josilyn Shippee F [phone]  

## 2012-03-18 NOTE — Progress Notes (Signed)
   CARDIOTHORACIC SURGERY PROGRESS NOTE  2 Days Post-Op  S/P Procedure(s) (LRB): LEFT HEART CATHETERIZATION WITH CORONARY ANGIOGRAM (N/A)  Subjective: Clinically stable. No chest pain.  Objective: Vital signs in last 24 hours: Temp:  [98.2 F (36.8 C)-99.5 F (37.5 C)] 98.8 F (37.1 C) (04/17 1418) Pulse Rate:  [62-69] 62  (04/17 1418) Cardiac Rhythm:  [-] Normal sinus rhythm;Other (Comment) (04/17 0800) Resp:  [18-20] 20  (04/17 1418) BP: (107-157)/(63-85) 125/82 mmHg (04/17 1418) SpO2:  [92 %-95 %] 95 % (04/17 1418) Weight:  [83.9 kg (184 lb 15.5 oz)] 83.9 kg (184 lb 15.5 oz) (04/16 1636)  Physical Exam:  Rhythm:   sinus  Breath sounds: clear  Heart sounds:  RRR  Incisions:  n/a  Abdomen:  soft  Extremities:  warm   Intake/Output from previous day: 04/16 0701 - 04/17 0700 In: 960 [P.O.:960] Out: 2250 [Urine:2250] Intake/Output this shift: Total I/O In: 240 [P.O.:240] Out: 500 [Urine:500]  Lab Results:  [facility] 03/18/12 0509 03/17/12 0540  WBC 10.4 8.0  HGB 14.1 13.5  HCT 41.8 40.5  PLT 129* 143*   BMET:  Basename 03/18/12 0509 03/17/12 0540  NA 137 138  K 3.4* 3.6  CL 102 105  CO2 22 20  GLUCOSE 106* 113*  BUN 70* 76*  CREATININE 6.71* 6.36*  CALCIUM 8.9 8.1*    CBG (last 3)  No results found for this basename: GLUCAP:3 in the last 72 hours PT/INR:  No results found for this basename: LABPROT,INR in the last 72 hours  CXR:  *RADIOLOGY REPORT*  Clinical Data: Preop for CABG  CHEST - 2 VIEW  Comparison: Portable chest x-ray of 03/16/2012  Findings: There is cardiomegaly present. There is some  indistinctness of the perihilar vasculature which may indicate mild  edema. There may be a tiny left effusion present as well. No bony  abnormality is seen.  IMPRESSION:  Cardiomegaly and probable mild edema. Small left effusion.  Original Report Authenticated By: Juline Patch, M.D.  Assessment/Plan: S/P Procedure(s) (LRB): LEFT HEART  CATHETERIZATION WITH CORONARY ANGIOGRAM (N/A)  For OR tomorrow.  Mr Froning understands the indications, risks and potential benefits of surgery.  All questions answered.  Lincoln Kleiner H 03/18/2012 2:29 PM

## 2012-03-18 NOTE — Progress Notes (Signed)
    Subjective:  No chest pain or dyspnea  Objective:  Vital Signs in the last 24 hours: Temp:  [97.7 F (36.5 C)-99.5 F (37.5 C)] 98.2 F (36.8 C) (04/17 0457) Pulse Rate:  [51-69] 67  (04/17 0457) Resp:  [18] 18  (04/17 0457) BP: (107-157)/(63-85) 107/63 mmHg (04/17 0457) SpO2:  [92 %-96 %] 92 % (04/17 0457) Weight:  [83.9 kg (184 lb 15.5 oz)] 83.9 kg (184 lb 15.5 oz) (04/16 1636)  Intake/Output from previous day: 04/16 0701 - 04/17 0700 In: 960 [P.O.:960] Out: 2250 [Urine:2250]  Physical Exam: Pt is alert and oriented, NAD HEENT: normal Neck: JVP - normal, carotids 2+= without bruits Lungs: CTA bilaterally CV: RRR without murmur or gallop Abd: soft, NT, Positive BS, no hepatomegaly Ext: no C/C/E, distal pulses intact and equal Skin: warm/dry no rash   Lab Results:  Basename 03/18/12 0509 03/17/12 0540  WBC 10.4 8.0  HGB 14.1 13.5  PLT 129* 143*    Basename 03/18/12 0509 03/17/12 0540  NA 137 138  K 3.4* 3.6  CL 102 105  CO2 22 20  GLUCOSE 106* 113*  BUN 70* 76*  CREATININE 6.71* 6.36*   No results found for this basename: TROPONINI:2,CK,MB:2 in the last 72 hours  Tele: Sinus rhythm  Assessment/Plan:  1. CAD - severe 2 vessel. Plan CABG tomorrow. Appreciate Dr. Orvan July consultation. 2. Advanced chronic kidney disease, stage V. Likely will need hemodialysis following CABG. 3. Tobacco - cessation counseling done. 4. Disposition - the patient is on appropriate medical therapy for his coronary artery disease. Cardiac surgery scheduled for tomorrow.  [name], M.D. 03/18/2012, 9:52 AM

## 2012-03-19 ENCOUNTER — Encounter (HOSPITAL_COMMUNITY): Payer: Self-pay | Admitting: Thoracic Surgery (Cardiothoracic Vascular Surgery)

## 2012-03-19 ENCOUNTER — Encounter (HOSPITAL_COMMUNITY): Payer: Self-pay | Admitting: Anesthesiology

## 2012-03-19 ENCOUNTER — Other Ambulatory Visit: Payer: Self-pay

## 2012-03-19 ENCOUNTER — Inpatient Hospital Stay (HOSPITAL_COMMUNITY): Payer: 59

## 2012-03-19 ENCOUNTER — Encounter (HOSPITAL_COMMUNITY)
Admission: RE | Disposition: A | Discharge status: Home / Self Care | Payer: Self-pay | Source: Ambulatory Visit | Attending: Thoracic Surgery (Cardiothoracic Vascular Surgery)

## 2012-03-19 ENCOUNTER — Inpatient Hospital Stay (HOSPITAL_COMMUNITY): Payer: 59 | Admitting: Anesthesiology

## 2012-03-19 DIAGNOSIS — I2510 Atherosclerotic heart disease of native coronary artery without angina pectoris: Secondary | ICD-10-CM

## 2012-03-19 DIAGNOSIS — Z951 Presence of aortocoronary bypass graft: Secondary | ICD-10-CM

## 2012-03-19 HISTORY — PX: CORONARY ARTERY BYPASS GRAFT: SHX141

## 2012-03-19 LAB — CBC
HCT: 26.5 % — ABNORMAL LOW (ref 39.0–52.0)
HCT: 27.5 % — ABNORMAL LOW (ref 39.0–52.0)
Hemoglobin: 8.9 g/dL — ABNORMAL LOW (ref 13.0–17.0)
Hemoglobin: 9.1 g/dL — ABNORMAL LOW (ref 13.0–17.0)
MCH: 30 pg (ref 26.0–34.0)
MCH: 29.4 pg (ref 26.0–34.0)
MCHC: 33.6 g/dL (ref 30.0–36.0)
MCV: 88.7 fL (ref 78.0–100.0)
Platelets: 136 10*3/uL — ABNORMAL LOW (ref 150–400)
RBC: 2.97 MIL/uL — ABNORMAL LOW (ref 4.22–5.81)
RBC: 3.1 MIL/uL — ABNORMAL LOW (ref 4.22–5.81)
WBC: 11.4 10*3/uL — ABNORMAL HIGH (ref 4.0–10.5)

## 2012-03-19 LAB — POCT I-STAT 4, (NA,K, GLUC, HGB,HCT)
Glucose, Bld: 86 mg/dL (ref 70–99)
Glucose, Bld: 120 mg/dL — ABNORMAL HIGH (ref 70–99)
Glucose, Bld: 134 mg/dL — ABNORMAL HIGH (ref 70–99)
Glucose, Bld: 110 mg/dL — ABNORMAL HIGH (ref 70–99)
HCT: 30 % — ABNORMAL LOW (ref 39.0–52.0)
HCT: 26 % — ABNORMAL LOW (ref 39.0–52.0)
HCT: 28 % — ABNORMAL LOW (ref 39.0–52.0)
HCT: 23 % — ABNORMAL LOW (ref 39.0–52.0)
Hemoglobin: 8.8 g/dL — ABNORMAL LOW (ref 13.0–17.0)
Hemoglobin: 7.8 g/dL — ABNORMAL LOW (ref 13.0–17.0)
Hemoglobin: 8.8 g/dL — ABNORMAL LOW (ref 13.0–17.0)
Potassium: 3.8 mEq/L (ref 3.5–5.1)
Potassium: 4.1 mEq/L (ref 3.5–5.1)
Potassium: 5 mEq/L (ref 3.5–5.1)
Potassium: 4.6 mEq/L (ref 3.5–5.1)
Sodium: 141 mEq/L (ref 135–145)
Sodium: 136 mEq/L (ref 135–145)
Sodium: 140 mEq/L (ref 135–145)
Sodium: 139 mEq/L (ref 135–145)

## 2012-03-19 LAB — POCT I-STAT, CHEM 8
BUN: 60 mg/dL — ABNORMAL HIGH (ref 6–23)
Chloride: 113 mEq/L — ABNORMAL HIGH (ref 96–112)
Glucose, Bld: 129 mg/dL — ABNORMAL HIGH (ref 70–99)
HCT: 25 % — ABNORMAL LOW (ref 39.0–52.0)
Potassium: 5.2 mEq/L — ABNORMAL HIGH (ref 3.5–5.1)

## 2012-03-19 LAB — POCT I-STAT 3, ART BLOOD GAS (G3+)
Acid-base deficit: 4 mmol/L — ABNORMAL HIGH (ref 0.0–2.0)
Acid-base deficit: 5 mmol/L — ABNORMAL HIGH (ref 0.0–2.0)
Acid-base deficit: 8 mmol/L — ABNORMAL HIGH (ref 0.0–2.0)
Acid-base deficit: 7 mmol/L — ABNORMAL HIGH (ref 0.0–2.0)
Acid-base deficit: 8 mmol/L — ABNORMAL HIGH (ref 0.0–2.0)
Acid-base deficit: 4 mmol/L — ABNORMAL HIGH (ref 0.0–2.0)
Bicarbonate: 21.7 mEq/L (ref 20.0–24.0)
Bicarbonate: 19.1 mEq/L — ABNORMAL LOW (ref 20.0–24.0)
Bicarbonate: 18 mEq/L — ABNORMAL LOW (ref 20.0–24.0)
Bicarbonate: 21 mEq/L (ref 20.0–24.0)
O2 Saturation: 100 %
O2 Saturation: 90 %
Patient temperature: 36.2
TCO2: 23 mmol/L (ref 0–100)
TCO2: 23 mmol/L (ref 0–100)
TCO2: 20 mmol/L (ref 0–100)
TCO2: 19 mmol/L (ref 0–100)
TCO2: 22 mmol/L (ref 0–100)
pCO2 arterial: 40.2 mmHg (ref 35.0–45.0)
pH, Arterial: 7.338 — ABNORMAL LOW (ref 7.350–7.450)
pH, Arterial: 7.31 — ABNORMAL LOW (ref 7.350–7.450)
pH, Arterial: 7.281 — ABNORMAL LOW (ref 7.350–7.450)
pO2, Arterial: 69 mmHg — ABNORMAL LOW (ref 80.0–100.0)
pO2, Arterial: 74 mmHg — ABNORMAL LOW (ref 80.0–100.0)

## 2012-03-19 LAB — PLATELET COUNT: Platelets: 93 10*3/uL — ABNORMAL LOW (ref 150–400)

## 2012-03-19 LAB — GLUCOSE, CAPILLARY
Glucose-Capillary: 109 mg/dL — ABNORMAL HIGH (ref 70–99)
Glucose-Capillary: 107 mg/dL — ABNORMAL HIGH (ref 70–99)

## 2012-03-19 LAB — HEMOGLOBIN AND HEMATOCRIT, BLOOD: HCT: 28.3 % — ABNORMAL LOW (ref 39.0–52.0)

## 2012-03-19 SURGERY — CORONARY ARTERY BYPASS GRAFTING (CABG)
Anesthesia: General | Site: Chest | Wound class: Clean

## 2012-03-19 MED ORDER — MORPHINE SULFATE 2 MG/ML IJ SOLN
1.0000 mg | INTRAMUSCULAR | Status: AC | PRN
  Administered 2012-03-19 – 2012-03-20 (×3): 2 mg via INTRAVENOUS
  Filled 2012-03-19 (×3): qty 1

## 2012-03-19 MED ORDER — FENTANYL CITRATE 0.05 MG/ML IJ SOLN
INTRAMUSCULAR | Status: DC | PRN
  Administered 2012-03-19: 250 ug via INTRAVENOUS
  Administered 2012-03-19: 50 ug via INTRAVENOUS
  Administered 2012-03-19: 100 ug via INTRAVENOUS
  Administered 2012-03-19 (×2): 150 ug via INTRAVENOUS
  Administered 2012-03-19: 250 ug via INTRAVENOUS
  Administered 2012-03-19: 500 ug via INTRAVENOUS
  Administered 2012-03-19: 50 ug via INTRAVENOUS

## 2012-03-19 MED ORDER — SODIUM BICARBONATE 8.4 % IV SOLN
100.0000 meq | Freq: Once | INTRAVENOUS | Status: AC
  Administered 2012-03-19: 100 meq via INTRAVENOUS
  Filled 2012-03-19: qty 100

## 2012-03-19 MED ORDER — INSULIN ASPART 100 UNIT/ML ~~LOC~~ SOLN
0.0000 [IU] | SUBCUTANEOUS | Status: AC
  Administered 2012-03-19 – 2012-03-20 (×3): 2 [IU] via SUBCUTANEOUS

## 2012-03-19 MED ORDER — SODIUM CHLORIDE 0.9 % IV SOLN
INTRAVENOUS | Status: DC
  Administered 2012-03-19: 19:00:00 via INTRAVENOUS

## 2012-03-19 MED ORDER — HYDROMORPHONE HCL PF 1 MG/ML IJ SOLN: 0.2500 mg | INTRAMUSCULAR | Status: DC | PRN

## 2012-03-19 MED ORDER — NITROPRUSSIDE SODIUM 25 MG/ML IV SOLN
0.0000 ug/kg/min | INTRAVENOUS | Status: DC
  Filled 2012-03-19 (×2): qty 2

## 2012-03-19 MED ORDER — BISACODYL 10 MG RE SUPP
10.0000 mg | Freq: Every day | RECTAL | Status: DC
  Administered 2012-03-22: 10 mg via RECTAL
  Filled 2012-03-19 (×2): qty 1

## 2012-03-19 MED ORDER — ANTITHROMBIN III (HUMAN) 500 UNITS IV SOLR
549.0000 [IU] | INTRAVENOUS | Status: DC
  Filled 2012-03-19: qty 11

## 2012-03-19 MED ORDER — CALCIUM CHLORIDE 10 % IV SOLN
1.0000 g | Freq: Once | INTRAVENOUS | Status: AC | PRN
  Filled 2012-03-19: qty 10

## 2012-03-19 MED ORDER — OXYCODONE HCL 5 MG PO TABS
5.0000 mg | ORAL_TABLET | ORAL | Status: DC | PRN
  Administered 2012-03-19 – 2012-03-20 (×2): 10 mg via ORAL
  Administered 2012-03-20: 5 mg via ORAL
  Administered 2012-03-20 – 2012-03-21 (×3): 10 mg via ORAL
  Administered 2012-03-24: 5 mg via ORAL
  Administered 2012-03-24: 10 mg via ORAL
  Filled 2012-03-19: qty 1
  Filled 2012-03-19 (×6): qty 2
  Filled 2012-03-19: qty 1

## 2012-03-19 MED ORDER — MAGNESIUM SULFATE 40 MG/ML IJ SOLN: 4.0000 g | Freq: Once | INTRAMUSCULAR | Status: DC

## 2012-03-19 MED ORDER — ASPIRIN EC 325 MG PO TBEC
325.0000 mg | DELAYED_RELEASE_TABLET | Freq: Every day | ORAL | Status: DC
  Administered 2012-03-20: 325 mg via ORAL
  Filled 2012-03-19 (×2): qty 1

## 2012-03-19 MED ORDER — SODIUM CHLORIDE 0.9 % IJ SOLN
3.0000 mL | Freq: Two times a day (BID) | INTRAMUSCULAR | Status: DC
  Administered 2012-03-20: 3 mL via INTRAVENOUS

## 2012-03-19 MED ORDER — SODIUM CHLORIDE 0.9 % IV SOLN
INTRAVENOUS | Status: DC
  Filled 2012-03-19: qty 1

## 2012-03-19 MED ORDER — DOPAMINE-DEXTROSE 3.2-5 MG/ML-% IV SOLN: 0.0000 ug/kg/min | INTRAVENOUS | Status: DC

## 2012-03-19 MED ORDER — MIDAZOLAM HCL 2 MG/2ML IJ SOLN
2.0000 mg | INTRAMUSCULAR | Status: DC | PRN
  Administered 2012-03-20: 1 mg via INTRAVENOUS
  Filled 2012-03-19: qty 2

## 2012-03-19 MED ORDER — INSULIN REGULAR BOLUS VIA INFUSION
0.0000 [IU] | Freq: Three times a day (TID) | INTRAVENOUS | Status: DC
  Filled 2012-03-19: qty 10

## 2012-03-19 MED ORDER — 0.9 % SODIUM CHLORIDE (POUR BTL) OPTIME
TOPICAL | Status: DC | PRN
  Administered 2012-03-19: 5000 mL

## 2012-03-19 MED ORDER — PROPOFOL 10 MG/ML IV BOLUS
INTRAVENOUS | Status: DC | PRN
  Administered 2012-03-19: 50 mg via INTRAVENOUS

## 2012-03-19 MED ORDER — ASPIRIN 81 MG PO CHEW: 324.0000 mg | CHEWABLE_TABLET | Freq: Every day | ORAL | Status: DC

## 2012-03-19 MED ORDER — PANTOPRAZOLE SODIUM 40 MG PO TBEC
40.0000 mg | DELAYED_RELEASE_TABLET | Freq: Every day | ORAL | Status: DC
  Administered 2012-03-21 – 2012-03-24 (×4): 40 mg via ORAL
  Filled 2012-03-19 (×4): qty 1

## 2012-03-19 MED ORDER — MORPHINE SULFATE 4 MG/ML IJ SOLN
2.0000 mg | INTRAMUSCULAR | Status: DC | PRN
  Administered 2012-03-20: 2 mg via INTRAVENOUS
  Administered 2012-03-20: 4 mg via INTRAVENOUS
  Filled 2012-03-19 (×2): qty 1

## 2012-03-19 MED ORDER — SODIUM BICARBONATE 8.4 % IV SOLN
50.0000 meq | Freq: Once | INTRAVENOUS | Status: AC
  Administered 2012-03-19: 50 meq via INTRAVENOUS
  Filled 2012-03-19: qty 50

## 2012-03-19 MED ORDER — INSULIN ASPART 100 UNIT/ML ~~LOC~~ SOLN
0.0000 [IU] | SUBCUTANEOUS | Status: DC
  Administered 2012-03-20 (×4): 2 [IU] via SUBCUTANEOUS

## 2012-03-19 MED ORDER — SODIUM BICARBONATE 8.4 % IV SOLN
INTRAVENOUS | Status: AC
  Administered 2012-03-19: 100 meq via INTRAVENOUS
  Filled 2012-03-19: qty 50

## 2012-03-19 MED ORDER — LORAZEPAM 2 MG/ML IJ SOLN: 1.0000 mg | Freq: Once | INTRAMUSCULAR | Status: DC | PRN

## 2012-03-19 MED ORDER — SODIUM CHLORIDE 0.9 % IV SOLN
INTRAVENOUS | Status: DC | PRN
  Administered 2012-03-19: 07:00:00 via INTRAVENOUS

## 2012-03-19 MED ORDER — ROCURONIUM BROMIDE 100 MG/10ML IV SOLN
INTRAVENOUS | Status: DC | PRN
  Administered 2012-03-19: 50 mg via INTRAVENOUS

## 2012-03-19 MED ORDER — SODIUM CHLORIDE 0.9 % IV SOLN: 250.0000 mL | INTRAVENOUS | Status: DC

## 2012-03-19 MED ORDER — METOPROLOL TARTRATE 1 MG/ML IV SOLN
2.5000 mg | INTRAVENOUS | Status: DC | PRN
  Administered 2012-03-19: 5 mg via INTRAVENOUS

## 2012-03-19 MED ORDER — PHENYLEPHRINE HCL 10 MG/ML IJ SOLN
0.0000 ug/min | INTRAVENOUS | Status: DC
  Filled 2012-03-19: qty 2

## 2012-03-19 MED ORDER — MOXIFLOXACIN HCL IN NACL 400 MG/250ML IV SOLN
400.0000 mg | INTRAVENOUS | Status: AC
  Administered 2012-03-20: 400 mg via INTRAVENOUS
  Filled 2012-03-19: qty 250

## 2012-03-19 MED ORDER — ACETAMINOPHEN 160 MG/5ML PO SOLN: 650.0000 mg | ORAL | Status: AC

## 2012-03-19 MED ORDER — ACETAMINOPHEN 500 MG PO TABS
1000.0000 mg | ORAL_TABLET | Freq: Four times a day (QID) | ORAL | Status: DC
  Administered 2012-03-19 – 2012-03-23 (×14): 1000 mg via ORAL
  Filled 2012-03-19 (×18): qty 2

## 2012-03-19 MED ORDER — METOPROLOL TARTRATE 25 MG/10 ML ORAL SUSPENSION
12.5000 mg | Freq: Two times a day (BID) | ORAL | Status: DC
  Filled 2012-03-19 (×3): qty 5

## 2012-03-19 MED ORDER — ESMOLOL HCL 10 MG/ML IV SOLN
INTRAVENOUS | Status: DC | PRN
  Administered 2012-03-19: 5 mg via INTRAVENOUS

## 2012-03-19 MED ORDER — SODIUM CHLORIDE 0.45 % IV SOLN
INTRAVENOUS | Status: DC
  Administered 2012-03-19: 15:00:00 via INTRAVENOUS

## 2012-03-19 MED ORDER — METOPROLOL TARTRATE 12.5 MG HALF TABLET
12.5000 mg | ORAL_TABLET | Freq: Two times a day (BID) | ORAL | Status: DC
  Administered 2012-03-20: 12.5 mg via ORAL
  Filled 2012-03-19 (×3): qty 1

## 2012-03-19 MED ORDER — FENTANYL CITRATE 0.05 MG/ML IJ SOLN: 50.0000 ug | INTRAMUSCULAR | Status: DC | PRN

## 2012-03-19 MED ORDER — 0.9 % SODIUM CHLORIDE (POUR BTL) OPTIME
TOPICAL | Status: DC | PRN
  Administered 2012-03-19: 2000 mL

## 2012-03-19 MED ORDER — SODIUM CHLORIDE 0.9 % IV SOLN: INTRAVENOUS | Status: DC

## 2012-03-19 MED ORDER — VECURONIUM BROMIDE 10 MG IV SOLR
INTRAVENOUS | Status: DC | PRN
  Administered 2012-03-19: 5 mg via INTRAVENOUS
  Administered 2012-03-19: 10 mg via INTRAVENOUS

## 2012-03-19 MED ORDER — DOCUSATE SODIUM 100 MG PO CAPS
200.0000 mg | ORAL_CAPSULE | Freq: Every day | ORAL | Status: DC
  Administered 2012-03-20 – 2012-03-25 (×5): 200 mg via ORAL
  Filled 2012-03-19 (×6): qty 2

## 2012-03-19 MED ORDER — SODIUM CHLORIDE 0.9 % IJ SOLN: 3.0000 mL | INTRAMUSCULAR | Status: DC | PRN

## 2012-03-19 MED ORDER — ACETAMINOPHEN 650 MG RE SUPP
650.0000 mg | RECTAL | Status: AC
  Administered 2012-03-19: 650 mg via RECTAL

## 2012-03-19 MED ORDER — BISACODYL 5 MG PO TBEC
10.0000 mg | DELAYED_RELEASE_TABLET | Freq: Every day | ORAL | Status: DC
  Administered 2012-03-20 – 2012-03-25 (×4): 10 mg via ORAL
  Filled 2012-03-19 (×5): qty 2

## 2012-03-19 MED ORDER — MIDAZOLAM HCL 2 MG/2ML IJ SOLN: 1.0000 mg | INTRAMUSCULAR | Status: DC | PRN

## 2012-03-19 MED ORDER — LACTATED RINGERS IV SOLN: INTRAVENOUS | Status: DC

## 2012-03-19 MED ORDER — VANCOMYCIN HCL IN DEXTROSE 1-5 GM/200ML-% IV SOLN
1000.0000 mg | Freq: Once | INTRAVENOUS | Status: AC
  Administered 2012-03-19: 1000 mg via INTRAVENOUS
  Filled 2012-03-19: qty 200

## 2012-03-19 MED ORDER — LIDOCAINE HCL (CARDIAC) 20 MG/ML IV SOLN
INTRAVENOUS | Status: DC | PRN
  Administered 2012-03-19: 100 mg via INTRAVENOUS

## 2012-03-19 MED ORDER — MIDAZOLAM HCL 5 MG/5ML IJ SOLN
INTRAMUSCULAR | Status: DC | PRN
  Administered 2012-03-19: 2 mg via INTRAVENOUS
  Administered 2012-03-19 (×2): 1 mg via INTRAVENOUS
  Administered 2012-03-19: 2 mg via INTRAVENOUS
  Administered 2012-03-19 (×2): 5 mg via INTRAVENOUS
  Administered 2012-03-19: 4 mg via INTRAVENOUS

## 2012-03-19 MED ORDER — SIMVASTATIN 40 MG PO TABS
40.0000 mg | ORAL_TABLET | Freq: Every day | ORAL | Status: DC
  Administered 2012-03-20 – 2012-03-21 (×2): 40 mg via ORAL
  Filled 2012-03-19 (×4): qty 1

## 2012-03-19 MED ORDER — SODIUM CHLORIDE 0.9 % IV SOLN
0.1000 ug/kg/h | INTRAVENOUS | Status: DC
  Filled 2012-03-19: qty 2

## 2012-03-19 MED ORDER — ACETAMINOPHEN 160 MG/5ML PO SOLN: 975.0000 mg | Freq: Four times a day (QID) | ORAL | Status: DC

## 2012-03-19 MED ORDER — SODIUM CHLORIDE 0.9 % IV SOLN
INTRAVENOUS | Status: DC | PRN
  Administered 2012-03-19 (×2): via INTRAVENOUS

## 2012-03-19 MED ORDER — FAMOTIDINE IN NACL 20-0.9 MG/50ML-% IV SOLN
20.0000 mg | Freq: Two times a day (BID) | INTRAVENOUS | Status: DC
  Administered 2012-03-19: 20 mg via INTRAVENOUS

## 2012-03-19 MED ORDER — POTASSIUM CHLORIDE 10 MEQ/50ML IV SOLN: 10.0000 meq | INTRAVENOUS | Status: DC

## 2012-03-19 MED ORDER — SODIUM CHLORIDE 0.9 % IV SOLN
INTRAVENOUS | Status: DC | PRN
  Administered 2012-03-19 (×2): via INTRAVENOUS

## 2012-03-19 MED ORDER — NITROGLYCERIN IN D5W 200-5 MCG/ML-% IV SOLN
0.0000 ug/min | INTRAVENOUS | Status: DC
  Administered 2012-03-20: 60 ug/min via INTRAVENOUS
  Filled 2012-03-19 (×2): qty 250

## 2012-03-19 MED ORDER — PROTAMINE SULFATE 10 MG/ML IV SOLN
INTRAVENOUS | Status: DC | PRN
  Administered 2012-03-19: 450 mg via INTRAVENOUS

## 2012-03-19 MED ORDER — ONDANSETRON HCL 4 MG/2ML IJ SOLN
4.0000 mg | Freq: Four times a day (QID) | INTRAMUSCULAR | Status: DC | PRN
  Administered 2012-03-20 – 2012-03-24 (×7): 4 mg via INTRAVENOUS
  Filled 2012-03-19 (×7): qty 2

## 2012-03-19 MED ORDER — SODIUM CHLORIDE 0.9 % IJ SOLN
OROMUCOSAL | Status: DC | PRN
  Administered 2012-03-19 (×2): via TOPICAL

## 2012-03-19 MED ORDER — ALBUMIN HUMAN 5 % IV SOLN: 250.0000 mL | INTRAVENOUS | Status: AC | PRN

## 2012-03-19 MED ORDER — HEPARIN SODIUM (PORCINE) 1000 UNIT/ML IJ SOLN
INTRAMUSCULAR | Status: DC | PRN
  Administered 2012-03-19: 48000 [IU] via INTRAVENOUS
  Administered 2012-03-19: 3000 [IU] via INTRAVENOUS

## 2012-03-19 SURGICAL SUPPLY — 127 items
ADAPTER CARDIO PERF ANTE/RETRO (ADAPTER) IMPLANT
ADH SKN CLS APL DERMABOND .7 (GAUZE/BANDAGES/DRESSINGS) ×2
ADPR PRFSN 84XANTGRD RTRGD (ADAPTER)
APL SKNCLS STERI-STRIP NONHPOA (GAUZE/BANDAGES/DRESSINGS) ×1
APPLIER CLIP 9.375 MED OPEN (MISCELLANEOUS)
APPLIER CLIP 9.375 SM OPEN (CLIP)
APR CLP MED 9.3 20 MLT OPN (MISCELLANEOUS)
APR CLP SM 9.3 20 MLT OPN (CLIP)
ATTRACTOMAT 16X20 MAGNETIC DRP (DRAPES) ×2 IMPLANT
BAG DECANTER FOR FLEXI CONT (MISCELLANEOUS) ×2 IMPLANT
BANDAGE ELASTIC 4 VELCRO ST LF (GAUZE/BANDAGES/DRESSINGS) ×2 IMPLANT
BANDAGE ELASTIC 6 VELCRO ST LF (GAUZE/BANDAGES/DRESSINGS) ×2 IMPLANT
BANDAGE GAUZE ELAST BULKY 4 IN (GAUZE/BANDAGES/DRESSINGS) ×2 IMPLANT
BASKET HEART (ORDER IN 25'S) (MISCELLANEOUS) ×1
BASKET HEART (ORDER IN 25S) (MISCELLANEOUS) ×1 IMPLANT
BENZOIN TINCTURE PRP APPL 2/3 (GAUZE/BANDAGES/DRESSINGS) ×2 IMPLANT
BLADE MINI RND TIP GREEN BEAV (BLADE) ×1 IMPLANT
BLADE STERNUM SYSTEM 6 (BLADE) ×3 IMPLANT
BLADE SURG 11 STRL SS (BLADE) ×1 IMPLANT
BLADE SURG ROTATE 9660 (MISCELLANEOUS) IMPLANT
CANISTER SUCTION 2500CC (MISCELLANEOUS) ×2 IMPLANT
CANNULA GUNDRY RCSP 15FR (MISCELLANEOUS) IMPLANT
CANNULA VESSEL W/WING W/VALVE (CANNULA) ×1 IMPLANT
CATH CPB KIT OWEN (MISCELLANEOUS) ×2 IMPLANT
CATH ROBINSON RED A/P 18FR (CATHETERS) ×4 IMPLANT
CATH THORACIC 28FR (CATHETERS) IMPLANT
CATH THORACIC 28FR RT ANG (CATHETERS) IMPLANT
CATH THORACIC 36FR (CATHETERS) ×2 IMPLANT
CATH THORACIC 36FR RT ANG (CATHETERS) ×3 IMPLANT
CLIP APPLIE 9.375 MED OPEN (MISCELLANEOUS) IMPLANT
CLIP APPLIE 9.375 SM OPEN (CLIP) IMPLANT
CLIP FOGARTY SPRING 6M (CLIP) ×1 IMPLANT
CLIP TI MEDIUM 24 (CLIP) IMPLANT
CLIP TI WIDE RED SMALL 24 (CLIP) ×2 IMPLANT
CLOTH BEACON ORANGE TIMEOUT ST (SAFETY) ×2 IMPLANT
CONN Y 3/8X3/8X3/8  BEN (MISCELLANEOUS)
CONN Y 3/8X3/8X3/8 BEN (MISCELLANEOUS) IMPLANT
COVER SURGICAL LIGHT HANDLE (MISCELLANEOUS) ×4 IMPLANT
CRADLE DONUT ADULT HEAD (MISCELLANEOUS) ×2 IMPLANT
DERMABOND ADVANCED (GAUZE/BANDAGES/DRESSINGS) ×2
DERMABOND ADVANCED .7 DNX12 (GAUZE/BANDAGES/DRESSINGS) IMPLANT
DRAIN CHANNEL 32F RND 10.7 FF (WOUND CARE) ×2 IMPLANT
DRAPE CARDIOVASCULAR INCISE (DRAPES) ×2
DRAPE SLUSH/WARMER DISC (DRAPES) ×2 IMPLANT
DRAPE SRG 135X102X78XABS (DRAPES) ×1 IMPLANT
DRSG COVADERM 4X14 (GAUZE/BANDAGES/DRESSINGS) ×2 IMPLANT
ELECT REM PT RETURN 9FT ADLT (ELECTROSURGICAL) ×4
ELECTRODE REM PT RTRN 9FT ADLT (ELECTROSURGICAL) ×2 IMPLANT
GLOVE BIO SURGEON STRL SZ 6 (GLOVE) ×1 IMPLANT
GLOVE BIO SURGEON STRL SZ 6.5 (GLOVE) ×5 IMPLANT
GLOVE BIO SURGEON STRL SZ7 (GLOVE) IMPLANT
GLOVE BIO SURGEON STRL SZ7.5 (GLOVE) IMPLANT
GLOVE BIO SURGEON STRL SZ8 (GLOVE) ×1 IMPLANT
GLOVE BIOGEL PI IND STRL 6 (GLOVE) IMPLANT
GLOVE BIOGEL PI IND STRL 6.5 (GLOVE) IMPLANT
GLOVE BIOGEL PI IND STRL 7.0 (GLOVE) IMPLANT
GLOVE BIOGEL PI INDICATOR 6 (GLOVE)
GLOVE BIOGEL PI INDICATOR 6.5 (GLOVE) ×5
GLOVE BIOGEL PI INDICATOR 7.0 (GLOVE)
GLOVE EUDERMIC 7 POWDERFREE (GLOVE) IMPLANT
GLOVE ORTHO TXT STRL SZ7.5 (GLOVE) ×4 IMPLANT
GOWN STRL NON-REIN LRG LVL3 (GOWN DISPOSABLE) ×11 IMPLANT
HEMOSTAT POWDER SURGIFOAM 1G (HEMOSTASIS) ×5 IMPLANT
INSERT FOGARTY 61MM (MISCELLANEOUS) IMPLANT
INSERT FOGARTY XLG (MISCELLANEOUS) ×2 IMPLANT
IV CATH 22GX1 FEP (IV SOLUTION) ×1 IMPLANT
KIT BASIN OR (CUSTOM PROCEDURE TRAY) ×2 IMPLANT
KIT ROOM TURNOVER OR (KITS) ×2 IMPLANT
KIT SUCTION CATH 14FR (SUCTIONS) ×2 IMPLANT
KIT VASOVIEW W/TROCAR VH 2000 (KITS) ×2 IMPLANT
LEAD PACING MYOCARDI (MISCELLANEOUS) ×2 IMPLANT
MARKER GRAFT CORONARY BYPASS (MISCELLANEOUS) ×6 IMPLANT
MARKER SKIN DUAL TIP RULER LAB (MISCELLANEOUS) ×1 IMPLANT
NS IRRIG 1000ML POUR BTL (IV SOLUTION) ×12 IMPLANT
PACK OPEN HEART (CUSTOM PROCEDURE TRAY) ×2 IMPLANT
PAD ARMBOARD 7.5X6 YLW CONV (MISCELLANEOUS) ×2 IMPLANT
PENCIL BUTTON HOLSTER BLD 10FT (ELECTRODE) ×2 IMPLANT
PUNCH AORTIC ROTATE 4.0MM (MISCELLANEOUS) ×1 IMPLANT
PUNCH AORTIC ROTATE 4.5MM 8IN (MISCELLANEOUS) IMPLANT
PUNCH AORTIC ROTATE 5MM 8IN (MISCELLANEOUS) IMPLANT
SET CARDIOPLEGIA MPS 5001102 (MISCELLANEOUS) ×1 IMPLANT
SOLUTION ANTI FOG 6CC (MISCELLANEOUS) IMPLANT
SPONGE GAUZE 4X4 12PLY (GAUZE/BANDAGES/DRESSINGS) ×4 IMPLANT
SPONGE LAP 18X18 X RAY DECT (DISPOSABLE) ×2 IMPLANT
SPONGE LAP 4X18 X RAY DECT (DISPOSABLE) ×1 IMPLANT
SUT BONE WAX W31G (SUTURE) ×2 IMPLANT
SUT ETHIBOND X763 2 0 SH 1 (SUTURE) ×5 IMPLANT
SUT MNCRL AB 3-0 PS2 18 (SUTURE) ×4 IMPLANT
SUT MNCRL AB 4-0 PS2 18 (SUTURE) ×2 IMPLANT
SUT PDS AB 1 CTX 36 (SUTURE) ×4 IMPLANT
SUT PROLENE 2 0 SH DA (SUTURE) IMPLANT
SUT PROLENE 3 0 SH DA (SUTURE) ×3 IMPLANT
SUT PROLENE 3 0 SH1 36 (SUTURE) IMPLANT
SUT PROLENE 4 0 RB 1 (SUTURE) ×6
SUT PROLENE 4 0 SH DA (SUTURE) IMPLANT
SUT PROLENE 4-0 RB1 .5 CRCL 36 (SUTURE) IMPLANT
SUT PROLENE 5 0 C 1 36 (SUTURE) IMPLANT
SUT PROLENE 6 0 C 1 30 (SUTURE) ×3 IMPLANT
SUT PROLENE 7.0 RB 3 (SUTURE) ×17 IMPLANT
SUT PROLENE 8 0 BV175 6 (SUTURE) ×9 IMPLANT
SUT PROLENE BLUE 7 0 (SUTURE) ×5 IMPLANT
SUT PROLENE POLY MONO (SUTURE) ×2 IMPLANT
SUT SILK  1 MH (SUTURE) ×2
SUT SILK 1 MH (SUTURE) ×1 IMPLANT
SUT STEEL 6MS V (SUTURE) IMPLANT
SUT STEEL STERNAL CCS#1 18IN (SUTURE) ×1 IMPLANT
SUT STEEL SZ 6 DBL 3X14 BALL (SUTURE) ×2 IMPLANT
SUT VIC AB 1 CTX 36 (SUTURE)
SUT VIC AB 1 CTX36XBRD ANBCTR (SUTURE) IMPLANT
SUT VIC AB 2-0 CT1 27 (SUTURE) ×4
SUT VIC AB 2-0 CT1 TAPERPNT 27 (SUTURE) IMPLANT
SUT VIC AB 2-0 CTX 27 (SUTURE) ×1 IMPLANT
SUT VIC AB 3-0 SH 27 (SUTURE)
SUT VIC AB 3-0 SH 27X BRD (SUTURE) IMPLANT
SUT VIC AB 3-0 X1 27 (SUTURE) IMPLANT
SUT VICRYL 4-0 PS2 18IN ABS (SUTURE) IMPLANT
SUTURE E-PAK OPEN HEART (SUTURE) ×2 IMPLANT
SYSTEM SAHARA CHEST DRAIN ATS (WOUND CARE) ×3 IMPLANT
TAPE CLOTH SURG 4X10 WHT LF (GAUZE/BANDAGES/DRESSINGS) ×1 IMPLANT
TOWEL OR 17X24 6PK STRL BLUE (TOWEL DISPOSABLE) ×4 IMPLANT
TOWEL OR 17X26 10 PK STRL BLUE (TOWEL DISPOSABLE) ×4 IMPLANT
TRAY FOLEY IC TEMP SENS 14FR (CATHETERS) ×2 IMPLANT
TUBE SUCT INTRACARD DLP 20F (MISCELLANEOUS) ×2 IMPLANT
TUBING INSUFFLATION 10FT LAP (TUBING) ×2 IMPLANT
UNDERPAD 30X30 INCONTINENT (UNDERPADS AND DIAPERS) ×2 IMPLANT
WATER STERILE IRR 1000ML POUR (IV SOLUTION) ×4 IMPLANT
YANKAUER SUCT BULB TIP NO VENT (SUCTIONS) ×1 IMPLANT

## 2012-03-19 NOTE — Anesthesia Preprocedure Evaluation (Addendum)
Anesthesia Evaluation  Patient identified by MRN, date of birth, ID band Patient awake    Reviewed: Allergy & Precautions, H&P , NPO status , Patient's Chart, lab work & pertinent test results, reviewed documented beta blocker date and time   Airway Mallampati: I TM Distance: >3 FB Neck ROM: Full    Dental  (+) Edentulous Upper   Pulmonary    Pulmonary exam normal       Cardiovascular hypertension, Pt. on medications and Pt. on home beta blockers + CAD     Neuro/Psych  Headaches, CVA, No Residual Symptoms    GI/Hepatic   Endo/Other    Renal/GU ESRFRenal disease     Musculoskeletal   Abdominal   Peds  Hematology   Anesthesia Other Findings   Reproductive/Obstetrics                          Anesthesia Physical Anesthesia Plan  ASA: III  Anesthesia Plan: General   Post-op Pain Management:    Induction: Intravenous  Airway Management Planned: Oral ETT  Additional Equipment: Arterial line, TEE and PA Cath  Intra-op Plan:   Post-operative Plan: Post-operative intubation/ventilation  Informed Consent: I have reviewed the patients History and Physical, chart, labs and discussed the procedure including the risks, benefits and alternatives for the proposed anesthesia with the patient or authorized representative who has indicated his/her understanding and acceptance.   Dental advisory given  Plan Discussed with: CRNA, Surgeon and Anesthesiologist  Anesthesia Plan Comments:        Anesthesia Quick Evaluation

## 2012-03-19 NOTE — Progress Notes (Signed)
Patient ID: [name], male DOB: Nov 10, 1961, 51 y.o.   MRN: [id number]   [facility] Progress Note    Subjective:   Intubated/sedated following CABG- no acute events noted intra/post-operatively.   Objective:   BP 115/61  Pulse 80  Temp(Src) 97 F (36.1 C) (Oral)  Resp 12  Ht 5\' 8"  (1.727 m)  Wt 86.9 kg (191 lb 9.3 oz)  BMI 29.13 kg/m2  SpO2 97%  Intake/Output Summary (Last 24 hours) at 03/19/12 1534 Last data filed at 03/19/12 1453  Gross per 24 hour  Intake   5872 ml  Output   2075 ml  Net   3797 ml   Weight change: 3 kg (6 lb 9.8 oz)  Physical Exam: Gen: intubated, sedated-appears comfortable CVS: pulse regular in rate and rhythm, heart sounds S1 and S2 distant but normal Resp: anteriorly clear to auscultation bilaterally. Anterior chest tube drains noted Abd: soft, flat, nontender bowel sounds are normal Ext: right lower extremity in Ace wrap following venous harvest, left lower extremity without edema  Imaging: Dg Chest 2 View  03/17/2012  *RADIOLOGY REPORT*  Clinical Data: Preop for CABG  CHEST - 2 VIEW  Comparison: Portable chest x-ray of 03/16/2012  Findings: There is cardiomegaly present.  There is some indistinctness of the perihilar vasculature which may indicate mild edema.  There may be a tiny left effusion present as well.  No bony abnormality is seen.  IMPRESSION: Cardiomegaly and probable mild edema.  Small left effusion.  Original Report Authenticated By: Juline Patch, M.D.   Dg Chest Portable 1 View  03/19/2012  *RADIOLOGY REPORT*  Clinical Data: Post CABG.  PORTABLE CHEST - 1 VIEW  Comparison: 03/17/2012.  Findings: Endotracheal tube is in satisfactory position. Right IJ central line tip projects over the SVC.  Right IJ Swan-Ganz catheter tip is coiled with the tip projecting toward the left pulmonary artery.  Nasogastric tube terminates in the stomach with the side port above the gastroesophageal junction.  Mediastinal drain,  bilateral  chest tubes and epicardial pacer wires are in place.  Seven intact sternotomy wires.  Discoid opacities are seen in the midlung zones.  Lungs are otherwise clear.  No definite pneumothorax.  No pleural fluid.  IMPRESSION:  1.  Right IJ Swan-Ganz catheter tip is coiled with the tip projecting toward the left pulmonary artery. 2.  Bilateral chest tubes without pneumothorax. 3.  Bilateral mid lung zone atelectasis.  Original Report Authenticated By: Reyes Ivan, M.D.    Labs: BMET  Lab 03/19/12 1335 03/19/12 1227 03/19/12 1132 03/19/12 1026 03/19/12 1012 03/19/12 0809 03/18/12 0509 03/17/12 0540 03/16/12 1414 03/13/12 1117  NA 140 136 136 141 141 141 137 -- -- --  K 5.0 6.1* 5.2* 4.1 4.0 3.8 3.4* -- -- --  CL -- -- -- -- -- -- 102 105 -- 101  CO2 -- -- -- -- -- -- 22 20 -- 23  GLUCOSE 134* 120* 104* 86 94 96 106* -- -- --  BUN -- -- -- -- -- -- 70* 76* -- 79*  CREATININE -- -- -- -- -- -- 6.71* 6.36* 6.86* 6.85*  ALB -- -- -- -- -- -- -- -- -- --  CALCIUM -- -- -- -- -- -- 8.9 8.1* -- 9.4  PHOS -- -- -- -- -- -- 5.4* 4.8* -- --   CBC  Lab 03/19/12 1447 03/19/12 1335 03/19/12 1227 03/19/12 1220 03/18/12 0509 03/17/12 0540 03/16/12 1414  WBC 8.7 -- -- -- 10.4  8.0 6.1  NEUTROABS -- -- -- -- -- -- --  HGB 8.9* 7.8* 8.8* 9.7* -- -- --  HCT 26.5* 23.0* 26.0* 28.3* -- -- --  MCV 89.2 -- -- -- 88.4 89.8 89.1  PLT 98* -- -- 93* 129* 143* --    Medications:      . acetaminophen (TYLENOL) oral liquid 160 mg/5 mL  650 mg Per Tube NOW   Or  . acetaminophen  650 mg Rectal NOW  . acetaminophen  1,000 mg Oral Q6H   Or  . acetaminophen (TYLENOL) oral liquid 160 mg/5 mL  975 mg Per Tube Q6H  . aspirin EC  325 mg Oral Daily   Or  . aspirin  324 mg Per Tube Daily  . bisacodyl  10 mg Oral Daily   Or  . bisacodyl  10 mg Rectal Daily  . chlorhexidine  60 mL Topical Once  . chlorhexidine  60 mL Topical Once  . dexmedetomidine (PRECEDEX) IV infusion for high rates  0.1-0.7 mcg/kg/hr  Intravenous To OR  . docusate sodium  200 mg Oral Daily  . DOPamine  2-20 mcg/kg/min Intravenous To OR  . famotidine (PEPCID) IV  20 mg Intravenous Q12H  . insulin (NOVOLIN-R) infusion   Intravenous To OR  . insulin regular  0-10 Units Intravenous TID WC  . magnesium sulfate  4 g Intravenous Once  . metoprolol tartrate  12.5 mg Oral BID   Or  . metoprolol tartrate  12.5 mg Per Tube BID  . moxifloxacin  400 mg Intravenous To OR  . moxifloxacin  400 mg Intravenous Q24H  . nitroGLYCERIN  2-200 mcg/min Intravenous To OR  . nitroglycerin-nicardipine-HEPARIN-sodium bicarbonate irrigation for artery spasm   Irrigation To OR  . pantoprazole  40 mg Oral Q1200  . phenylephrine (NEO-SYNEPHRINE) Adult infusion  30-200 mcg/min Intravenous To OR  . potassium chloride  10 mEq Intravenous Q1 Hr x 3  . simvastatin  40 mg Oral q1800  . sodium chloride  3 mL Intravenous Q12H  . tranexamic acid  15 mg/kg Intravenous To OR  . tranexamic acid (CYKLOKAPRON) infusion ([facility])  1.5 mg/kg/hr Intravenous To OR  . vancomycin  1,250 mg Intravenous To OR  . vancomycin  1,000 mg Intravenous Once  . DISCONTD: allopurinol  100 mg Oral BID  . DISCONTD: antithrombin III (human)  549 Units Intravenous To OR  . DISCONTD: aspirin  325 mg Oral Daily  . DISCONTD: atorvastatin  20 mg Oral q1800  . DISCONTD: bisacodyl  5 mg Oral Once  . DISCONTD: calcitRIOL  0.25 mcg Oral Q M,W,F  . DISCONTD: cloNIDine  0.2 mg Oral QHS  . DISCONTD: enoxaparin  40 mg Subcutaneous Daily  . DISCONTD: epinephrine  0.5-20 mcg/min Intravenous To OR  . DISCONTD: fenofibrate  160 mg Oral Daily  . DISCONTD: hydrALAZINE  25 mg Oral Daily  . DISCONTD: magnesium sulfate  40 mEq Other To OR  . DISCONTD: metoprolol  50 mg Oral QID  . DISCONTD: metoprolol tartrate  12.5 mg Oral Once  . DISCONTD: nisoldipine  17 mg Oral BID  . DISCONTD: omega-3 acid ethyl esters  3 g Oral BID  . DISCONTD: potassium chloride  80 mEq Other To OR  . DISCONTD: tranexamic  acid  2 mg/kg Intracatheter To OR  . DISCONTD: Vitamin D (Ergocalciferol)  50,000 Units Oral Q7 days     Assessment/ Plan:   1. Status post 2 vessel coronary artery bypass grafting surgery today for CAD-currently on inotropic support  and weaning down on pressors. 2. CKD stage 5- will follow renal function post coronary artery bypass grafting-of particular importance will be he is potassium level the prompted need for intervention. Most recent potassium level was 4.7. 3. Vascular access- left AVF >39 year old but not quite as mature/developed. Doppler evaluation results noted to show patency of the fistula with a branch (may necessitate ligation the fistula flows unsuitable). 4. HTN- stable and continue to monitor on pressors/inotrope support at this time 5. Secondary hyperparathyroidism: Calcitriol currently on hold while n.p.o., plan to restart this plus/minus binders postoperatively. 6. Anemia of chronic kidney disease: Continue to monitor closely for postoperative losses and need to restart Aranesp Zetta Bills, MD 03/19/2012, 3:34 PM

## 2012-03-19 NOTE — Preoperative (Signed)
Beta Blockers   Reason not to administer Beta Blockers:Not Applicable 

## 2012-03-19 NOTE — Procedures (Signed)
Extubation Procedure Note  Patient Details:   Name: [name] DOB: 07/08/1961 MRN: [id number]   Airway Documentation:  Airway 8 mm (Active)  Secured at (cm) 22 cm 03/19/2012  7:53 PM  Measured From Lips 03/19/2012  7:53 PM  Secured Location Right 03/19/2012  7:53 PM  Secured By Caron Presume Tape 03/19/2012  7:53 PM    Evaluation  O2 sats: stable throughout Complications: No apparent complications Patient did tolerate procedure well. Bilateral Breath Sounds: Clear;Diminished   Yes  Ave Filter 03/19/2012, 9:13 PM

## 2012-03-19 NOTE — Progress Notes (Signed)
Pt NIF -30. Pt VC 1 liter. Cuff leak audible.

## 2012-03-19 NOTE — OR Nursing (Signed)
Made call to volunteer desk at 1334 to inform family patient is off pump. Made first call to 2300 SICU at 1335. Made second call to 2300 SICU at 1408.

## 2012-03-19 NOTE — Brief Op Note (Signed)
03/16/2012 - 03/19/2012  12:17 PM  PATIENT:  [name]  51 y.o. male  PRE-OPERATIVE DIAGNOSIS:  CORONARY ARTERY DISEASE  POST-OPERATIVE DIAGNOSIS:  CORONARY ARTERY DISEASE  PROCEDURE:  CORONARY ARTERY BYPASS GRAFTING x5   LIMA to LAD  Free RIMA to OM1  Sequential SVG to Acute Marginal and LPLB2  SVG to Diagaonal 1  EVH via right thigh and leg  SURGEON:    [name], MD  ASSISTANTS:  Lowella Dandy, PA-C  ANESTHESIA:   Bedelia Person, MD  CROSSCLAMP TIME:   130' CARDIOPULMONARY BYPASS TIME: 172'  FINDINGS:  Normal LV systolic function  Severe LVH with diastolic dysfunction  Good quality IMA and SVG conduit for grafting  Good quality target vessels for grafting  COMPLICATIONS: none  PATIENT DISPOSITION:   TO SICU IN STABLE CONDITION  Ranay Ketter H 03/19/2012 2:17 PM

## 2012-03-19 NOTE — Progress Notes (Signed)
Patient ID: [name], male   DOB: 07-18-1961, 51 y.o.   MRN: [id number] S/p CABG x 5  Intubated, sedated  BP 106/64  Pulse 80  Temp(Src) 97 F (36.1 C) (Oral)  Resp 12  Ht 5\' 8"  (1.727 m)  Wt 191 lb 9.3 oz (86.9 kg)  BMI 29.13 kg/m2  SpO2 98%   Intake/Output Summary (Last 24 hours) at 03/19/12 1735 Last data filed at 03/19/12 1600  Gross per 24 hour  Intake 5599.36 ml  Output   1860 ml  Net 3739.36 ml   UO good so far Minimal CT output  Continue present care

## 2012-03-19 NOTE — Transfer of Care (Signed)
Immediate Anesthesia Transfer of Care Note  Patient: [name]  Procedure(s) Performed: Procedure(s) (LRB): CORONARY ARTERY BYPASS GRAFTING (CABG) (N/A)  Patient Location: SICU  Anesthesia Type: General  Level of Consciousness: sedated  Airway & Oxygen Therapy: Patient remains intubated per anesthesia plan and Patient placed on Ventilator (see vital sign flow sheet for setting)  Post-op Assessment: Report given to PACU RN and Post -op Vital signs reviewed and stable  Post vital signs: Reviewed and stable  Complications: No apparent anesthesia complications

## 2012-03-19 NOTE — Progress Notes (Signed)
  Echocardiogram Echocardiogram Transesophageal has been performed.  [name] 03/19/2012, 8:31 AM

## 2012-03-19 NOTE — Op Note (Signed)
CARDIOTHORACIC SURGERY OPERATIVE NOTE  Date of Procedure: 03/19/2012  Preoperative Diagnosis: Severe 3-vessel Coronary Artery Disease  Postoperative Diagnosis: Same  Procedure:   Coronary Artery Bypass Grafting x 5   Left Internal Mammary Artery to Distal Left Anterior Descending Coronary Artery  Free Right Internal Mammary Artery to Obtuse Marginal Branch of Left Circumflex Coronary Artery  Sequential saphenous Vein Graft to Acute Marginal Branch and Second Left Posterolateral Branch Coronary Artery  Saphenous Vein Graft to First Diagonal Branch Coronary Artery  Endoscopic Vein Harvest from Right Thigh and Lower Leg  Surgeon: Salvatore Decent. Cornelius Moras, MD  Assistant: Lowella Dandy, PA-C  Anesthesia: Bedelia Person, MD  Operative Findings:  Normal left ventricular systolic function  Severe left ventricular hypertrophy with diastolic dysfunction  Good quality left and right internal mammary artery conduits  Good quality saphenous vein conduit  Good quality target vessels for grafting     BRIEF CLINICAL NOTE AND INDICATIONS FOR SURGERY  Patient is a 51 year old gentleman from [place] with no previous history of coronary artery disease but risk factors notable for history of hypertension, previous stroke, and end-stage kidney disease. The patient's kidney disease was originally diagnosed at age 51. He has been followed for many years by Dr. Eliott Nine and he recently has been making plans for living related donor kidney transplant using a kidney from his 49 year old son. He underwent a stress test that was abnormal prompting diagnostic cardiac catheterization that was performed earlier today by Dr. Excell Seltzer. He was found to have severe two-vessel coronary artery disease with preserved left ventricular function. Cardiothoracic surgical consultation was requested.  The patient has been seen in consultation and counseled at length regarding the indications, risks and potential benefits of  surgery.  All questions have been answered, and the patient provides full informed consent for the operation as described.        DETAILS OF THE OPERATIVE PROCEDURE  The patient is brought to the operating room on the above mentioned date and central monitoring was established by the anesthesia team including placement of Swan-Ganz catheter and radial arterial line. The patient is placed in the supine position on the operating table.  Intravenous antibiotics are administered. General endotracheal anesthesia is induced uneventfully. A Foley catheter is placed.  Baseline transesophageal echocardiogram was performed.  Findings were notable for normal LV systolic function with severe LV hypertrophy.  There was mild mitral regurgitation and trace aortic insufficiency.  The patient's chest, abdomen, both groins, and both lower extremities are prepared and draped in a sterile manner. A time out procedure is performed.  A median sternotomy incision was performed and the left internal mammary artery is dissected from the chest wall and prepared for bypass grafting. The left internal mammary artery is notably good quality conduit. Following this the right internal mammary artery was dissected from the chest wall.  It was also good quality.  Simultaneously, saphenous vein is obtained from the patient's right thigh and leg using endoscopic vein harvest technique. The saphenous vein is notably good quality conduit. After removal of the saphenous vein, the small surgical incisions in the lower extremity are closed with absorbable suture. Following systemic heparinization, the internal mammary arteries were transected distally noted to have excellent flow.    The pericardium is opened. The ascending aorta is normal in appearance. The ascending aorta and the right atrium are cannulated for cardioplegia bypass.  Adequate heparinization is verified.    The right internal mammary artery is assessed for length and  found to be too  short to be utilized in-situ for any of the target vessels.  Subsequently it is amputated near its origin from the right subclavian artery and the proximal stump oversewn.  The entire pre-bypass portion of the operation was notable for stable hemodynamics.  Cardiopulmonary bypass was begun and the surface of the heart is inspected. Distal target vessels are selected for coronary artery bypass grafting. A cardioplegia cannula is placed in the ascending aorta.  A temperature probe was placed in the interventricular septum.  The patient is cooled to 32C systemic temperature.  The aortic cross clamp is applied and cold blood cardioplegia is delivered initially in an antegrade fashion through the aortic root.  Iced saline slush is applied for topical hypothermia.  The initial cardioplegic arrest is rapid with early diastolic arrest.  Repeat doses of cardioplegia are administered intermittently throughout the entire cross clamp portion of the operation through the aortic root and through subsequently placed vein grafts in order to maintain completely flat electrocardiogram and septal myocardial temperature below 15C.  Myocardial protection was felt to be  excellent.  The following distal coronary artery bypass grafts were performed:   The acute marginal branch of the right coronary artery was grafted using a reversed saphenous vein graft in an side-to-side fashion.  At the site of distal anastomosis the target vessel was good quality and measured approximately 1.5 mm in diameter.  The second posterolateral branch of the left circumflex coronary artery was grafted using a sequential saphenous vein graft in an end-to-side fashion using the vein placed to the acute marginal branch.  At the site of distal anastomosis the target vessel was good quality and measured approximately 1.5 mm in diameter.  The first diagonal branch of the left anterior descending coronary artery was grafted using a  reversed saphenous vein graft in an end-to-side fashion.  At the site of distal anastomosis the target vessel was good quality and measured approximately 1.5 mm in diameter.  The first obtuse marginal branch of the left circumflex coronary artery was grafted using the right internal mammary artery graft as a free graft in an end-to-side fashion.  At the site of distal anastomosis the target vessel was good quality and measured approximately 2.0 mm in diameter.  The distal left anterior coronary artery was grafted with the left internal mammary artery in an end-to-side fashion.  At the site of distal anastomosis the target vessel was good quality and measured approximately 2.0 mm in diameter.   Both proximal vein graft anastomoses were placed directly to the ascending aorta prior to removal of the aortic cross clamp.  A short piece of saphenous vein was also sewn to the aorta and later to the proximal end of the free right internal mammary artery graft.  The septal myocardial temperature rose rapidly after reperfusion of the left internal mammary artery graft.  The aortic cross clamp was removed after a total cross clamp time of 130 minutes.  All proximal and distal coronary anastomoses were inspected for hemostasis and appropriate graft orientation. Epicardial pacing wires are fixed to the right ventricular outflow tract and to the right atrial appendage. The patient is rewarmed to 37C temperature. The patient is weaned and disconnected from cardiopulmonary bypass.  The patient's rhythm at separation from bypass was AV paced.  The patient was weaned from cardioplegic bypass on dopamine at renal dose. Total cardiopulmonary bypass time for the operation was 172 minutes.  Followup transesophageal echocardiogram performed after separation from bypass revealed  no changes from the  preoperative exam.  The aortic and venous cannula were removed uneventfully. Protamine was administered to reverse the  anticoagulation. The mediastinum and pleural space were inspected for hemostasis and irrigated with saline solution. The mediastinum and both pleural spaces were drained using 4 chest tubes placed through separate stab incisions inferiorly.  The soft tissues anterior to the aorta were reapproximated loosely. The sternum is closed with double strength sternal wire. The soft tissues anterior to the sternum were closed in multiple layers and the skin is closed with a running subcuticular skin closure.  The post-bypass portion of the operation was notable for stable rhythm and hemodynamics.  The patient tolerated the procedure well and is transported to the surgical intensive care in stable condition. There are no intraoperative complications. All sponge instrument and needle counts are verified correct at completion of the operation.    Salvatore Decent. Cornelius Moras MD 03/19/2012 2:36 PM

## 2012-03-20 ENCOUNTER — Encounter (HOSPITAL_COMMUNITY): Payer: Self-pay | Admitting: Thoracic Surgery (Cardiothoracic Vascular Surgery)

## 2012-03-20 ENCOUNTER — Inpatient Hospital Stay (HOSPITAL_COMMUNITY): Payer: 59

## 2012-03-20 LAB — GLUCOSE, CAPILLARY
Glucose-Capillary: 106 mg/dL — ABNORMAL HIGH (ref 70–99)
Glucose-Capillary: 123 mg/dL — ABNORMAL HIGH (ref 70–99)
Glucose-Capillary: 127 mg/dL — ABNORMAL HIGH (ref 70–99)
Glucose-Capillary: 135 mg/dL — ABNORMAL HIGH (ref 70–99)
Glucose-Capillary: 140 mg/dL — ABNORMAL HIGH (ref 70–99)

## 2012-03-20 LAB — PREPARE PLATELET PHERESIS: Unit division: 0

## 2012-03-20 LAB — CREATININE, SERUM
Creatinine, Ser: 7.06 mg/dL — ABNORMAL HIGH (ref 0.50–1.35)
GFR calc non Af Amer: 8 mL/min — ABNORMAL LOW (ref >90)

## 2012-03-20 LAB — BASIC METABOLIC PANEL
Calcium: 7.9 mg/dL — ABNORMAL LOW (ref 8.4–10.5)
GFR calc Af Amer: 10 mL/min — ABNORMAL LOW (ref >90)
GFR calc non Af Amer: 9 mL/min — ABNORMAL LOW (ref >90)
Glucose, Bld: 126 mg/dL — ABNORMAL HIGH (ref 70–99)
Potassium: 4.9 mEq/L (ref 3.5–5.1)
Sodium: 142 mEq/L (ref 135–145)

## 2012-03-20 LAB — PREPARE FRESH FROZEN PLASMA: Unit division: 0

## 2012-03-20 LAB — POCT I-STAT, CHEM 8
BUN: 65 mg/dL — ABNORMAL HIGH (ref 6–23)
Calcium, Ion: 1.22 mmol/L (ref 1.12–1.32)
Chloride: 112 mEq/L (ref 96–112)
Creatinine, Ser: 7.4 mg/dL — ABNORMAL HIGH (ref 0.50–1.35)
Glucose, Bld: 130 mg/dL — ABNORMAL HIGH (ref 70–99)
HCT: 25 % — ABNORMAL LOW (ref 39.0–52.0)
Potassium: 4.9 mEq/L (ref 3.5–5.1)
TCO2: 22 mmol/L (ref 0–100)

## 2012-03-20 LAB — CBC
HCT: 28.1 % — ABNORMAL LOW (ref 39.0–52.0)
Hemoglobin: 8.6 g/dL — ABNORMAL LOW (ref 13.0–17.0)
Hemoglobin: 9.3 g/dL — ABNORMAL LOW (ref 13.0–17.0)
MCH: 29.8 pg (ref 26.0–34.0)
MCHC: 33.1 g/dL (ref 30.0–36.0)
Platelets: 136 10*3/uL — ABNORMAL LOW (ref 150–400)
RBC: 3.08 MIL/uL — ABNORMAL LOW (ref 4.22–5.81)

## 2012-03-20 LAB — MAGNESIUM
Magnesium: 2.3 mg/dL (ref 1.5–2.5)
Magnesium: 2.4 mg/dL (ref 1.5–2.5)

## 2012-03-20 MED ORDER — FUROSEMIDE 10 MG/ML IJ SOLN
80.0000 mg | Freq: Once | INTRAMUSCULAR | Status: AC
  Filled 2012-03-20: qty 8

## 2012-03-20 MED ORDER — INSULIN ASPART 100 UNIT/ML ~~LOC~~ SOLN
0.0000 [IU] | SUBCUTANEOUS | Status: DC
  Administered 2012-03-21 (×2): 2 [IU] via SUBCUTANEOUS

## 2012-03-20 MED ORDER — METOPROLOL TARTRATE 25 MG PO TABS
25.0000 mg | ORAL_TABLET | Freq: Two times a day (BID) | ORAL | Status: DC
  Administered 2012-03-20: 25 mg via ORAL
  Filled 2012-03-20 (×3): qty 1

## 2012-03-20 MED ORDER — FUROSEMIDE 10 MG/ML IJ SOLN
80.0000 mg | Freq: Two times a day (BID) | INTRAMUSCULAR | Status: DC
  Administered 2012-03-20 (×2): 80 mg via INTRAVENOUS
  Filled 2012-03-20 (×5): qty 8

## 2012-03-20 MED ORDER — METOPROLOL TARTRATE 25 MG/10 ML ORAL SUSPENSION
25.0000 mg | Freq: Two times a day (BID) | ORAL | Status: DC
  Filled 2012-03-20 (×3): qty 10

## 2012-03-20 NOTE — Progress Notes (Signed)
   CARDIOTHORACIC SURGERY PROGRESS NOTE   R1 Day Post-Op Procedure(s) (LRB): CORONARY ARTERY BYPASS GRAFTING (CABG) (N/A)  Subjective: Looks good.  Expected soreness in chest.  Hurts to take a deep breath.  Objective: Vital signs: BP Readings from Last 1 Encounters:  03/20/12 137/77   Pulse Readings from Last 1 Encounters:  03/20/12 95   Resp Readings from Last 1 Encounters:  03/20/12 24   Temp Readings from Last 1 Encounters:  03/20/12 98.1 F (36.7 C)     Hemodynamics: PAP: (27-54)/(16-30) 45/28 mmHg CO:  [5 L/min-6.3 L/min] 5.3 L/min CI:  [2.5 L/min/m2-3.1 L/min/m2] 2.6 L/min/m2  Physical Exam:  Rhythm:   sinus  Breath sounds: clear  Heart sounds:  RRR  Incisions:  Dressings dry  Abdomen:  soft  Extremities:  warm   Intake/Output from previous day: 04/18 0701 - 04/19 0700 In: 7184.1 [P.O.:350; I.V.:4762.1; Blood:1792; NG/GT:30; IV Piggyback:250] Out: 3880 [Urine:1810; Emesis/NG output:200; Blood:1000; Chest Tube:870] Intake/Output this shift:    Lab Results:  Basename 03/20/12 0417 03/19/12 2045  WBC 9.2 11.4*  HGB 8.6* 9.1*  HCT 26.0* 27.5*  PLT 136* 136*   BMET:  Basename 03/20/12 0417 03/19/12 2045 03/19/12 2040 03/18/12 0509  NA 142 -- 141 --  K 4.9 -- 5.2* --  CL 109 -- 113* --  CO2 22 -- -- 22  GLUCOSE 126* -- 129* --  BUN 62* -- 60* --  CREATININE 6.51* 6.22* -- --  CALCIUM 7.9* -- -- 8.9    CBG (last 3)   Basename 03/20/12 0353 03/20/12 0153 03/20/12 0002  GLUCAP 106* 123* 140*   ABG    Component Value Date/Time   PHART 7.333* 03/19/2012 2201   HCO3 21.0 03/19/2012 2201   TCO2 22 03/19/2012 2201   ACIDBASEDEF 4.0* 03/19/2012 2201   O2SAT 93.0 03/19/2012 2201   CXR: Looks good  Assessment/Plan: S/P Procedure(s) (LRB): CORONARY ARTERY BYPASS GRAFTING (CABG) (N/A)  Doing well POD1 Expected post op acute blood loss anemia, mild, stable Expected post op volume excess, mild Hypertension, severe, longstanding Chronic kidney  disease, severe   Mobilize  D/C tubes and lines  Try IV lasix to stimulate UOP  Watch anemia   Denay Pleitez H 03/20/2012 7:45 AM

## 2012-03-20 NOTE — Progress Notes (Signed)
[facility] BRIEF SICU PROGRESS NOTE  1 Day Post-Op  S/P Procedure(s) (LRB): CORONARY ARTERY BYPASS GRAFTING (CABG) (N/A)   Stable day Soreness improved since tubes out Sinus rhythm w/ PAC's BP still increased, requiring IV NTG UOP adequate  Plan: Will increase metoprolol.  Otherwise continue current plan   [name] 03/20/2012 7:40 PM

## 2012-03-20 NOTE — Progress Notes (Signed)
Patient ID: THAO VANOVER, male   DOB: 11-06-1961, 51 y.o.   MRN: [id number] S:chest pain and SOB O:BP 128/85  Pulse 97  Temp(Src) 97.9 F (36.6 C) (Core (Comment))  Resp 23  Ht 5\' 8"  (1.727 m)  Wt 91.627 kg (202 lb)  BMI 30.71 kg/m2  SpO2 93%  Intake/Output Summary (Last 24 hours) at 03/20/12 0820 Last data filed at 03/20/12 0800  Gross per 24 hour  Intake 7269.92 ml  Output   3955 ml  Net 3314.92 ml   Weight change: 4.726 kg (10 lb 6.7 oz) Gen:WD WN WM in mild distress CVS:RRR  Resp:minimal bibasilar crackles Abd:+bs Ext:1+ edema, left cimino AVF +T/B   Lab 03/20/12 0417 03/19/12 2045 03/19/12 2040 03/19/12 1457 03/19/12 1335 03/19/12 1227 03/19/12 1132 03/19/12 1026 03/18/12 0509 03/17/12 0540 03/16/12 1414 03/13/12 1117  NA 142 -- 141 139 140 136 136 141 -- -- -- --  K 4.9 -- 5.2* 4.6 5.0 6.1* 5.2* 4.1 -- -- -- --  CL 109 -- 113* -- -- -- -- -- 102 105 -- 101  CO2 22 -- -- -- -- -- -- -- 22 20 -- 23  GLUCOSE 126* -- 129* 110* 134* 120* 104* 86 -- -- -- --  BUN 62* -- 60* -- -- -- -- -- 70* 76* -- 79*  CREATININE 6.51* 6.22* 6.70* -- -- -- -- -- 6.71* 6.36* 6.86* 6.85*  ALB -- -- -- -- -- -- -- -- -- -- -- --  CALCIUM 7.9* -- -- -- -- -- -- -- 8.9 8.1* -- 9.4  PHOS -- -- -- -- -- -- -- -- 5.4* 4.8* -- --  AST -- -- -- -- -- -- -- -- -- -- -- --  ALT -- -- -- -- -- -- -- -- -- -- -- --   Liver Function Tests:  Lab 03/18/12 0509 03/17/12 0540  AST -- --  ALT -- --  ALKPHOS -- --  BILITOT -- --  PROT -- --  ALBUMIN 2.9* 2.8*   No results found for this basename: LIPASE:3,AMYLASE:3 in the last 168 hours No results found for this basename: AMMONIA:3 in the last 168 hours CBC:  Lab 03/20/12 0417 03/19/12 2045 03/19/12 2040 03/19/12 1447 03/18/12 0509 03/17/12 0540  WBC 9.2 11.4* -- 8.7 -- --  NEUTROABS -- -- -- -- -- --  HGB 8.6* 9.1* 8.5* -- -- --  HCT 26.0* 27.5* 25.0* -- -- --  MCV 90.0 88.7 -- 89.2 88.4 89.8  PLT 136* 136* -- 98* -- --   Cardiac  Enzymes: No results found for this basename: CKTOTAL:5,CKMB:5,CKMBINDEX:5,TROPONINI:5 in the last 168 hours CBG:  Lab 03/20/12 0803 03/20/12 0353 03/20/12 0153 03/20/12 0002 03/19/12 2127  GLUCAP 134* 106* 123* 140* 127*    Iron Studies: No results found for this basename: IRON,TIBC,TRANSFERRIN,FERRITIN in the last 72 hours Studies/Results: Dg Chest Portable 1 View In Am  03/20/2012  *RADIOLOGY REPORT*  Clinical Data: Coronary artery disease.  Status post CABG.  PORTABLE CHEST - 1 VIEW  Comparison: 03/19/2012  Findings: Endotracheal tube and NG tube have been removed.  Theone Murdoch catheter appears to be looped in the pulmonary outflow tract or main pulmonary artery.  There is a second central venous catheter with the tip in the superior vena cava.  Multiple chest tubes in place.  No pneumothorax.  Linear atelectasis in the upper lobes is unchanged.  IMPRESSION: No pneumothorax.  No change in the bilateral atelectasis.  Swan-Ganz catheter appears to be  looped in the main pulmonary artery.  Original Report Authenticated By: Gwynn Burly, M.D.   Dg Chest Portable 1 View  03/19/2012  *RADIOLOGY REPORT*  Clinical Data: Post CABG.  PORTABLE CHEST - 1 VIEW  Comparison: 03/17/2012.  Findings: Endotracheal tube is in satisfactory position. Right IJ central line tip projects over the SVC.  Right IJ Swan-Ganz catheter tip is coiled with the tip projecting toward the left pulmonary artery.  Nasogastric tube terminates in the stomach with the side port above the gastroesophageal junction.  Mediastinal drain,  bilateral chest tubes and epicardial pacer wires are in place.  Seven intact sternotomy wires.  Discoid opacities are seen in the midlung zones.  Lungs are otherwise clear.  No definite pneumothorax.  No pleural fluid.  IMPRESSION:  1.  Right IJ Swan-Ganz catheter tip is coiled with the tip projecting toward the left pulmonary artery. 2.  Bilateral chest tubes without pneumothorax. 3.  Bilateral mid lung  zone atelectasis.  Original Report Authenticated By: Reyes Ivan, M.D.      . acetaminophen (TYLENOL) oral liquid 160 mg/5 mL  650 mg Per Tube NOW   Or  . acetaminophen  650 mg Rectal NOW  . acetaminophen  1,000 mg Oral Q6H   Or  . acetaminophen (TYLENOL) oral liquid 160 mg/5 mL  975 mg Per Tube Q6H  . aspirin EC  325 mg Oral Daily   Or  . aspirin  324 mg Per Tube Daily  . bisacodyl  10 mg Oral Daily   Or  . bisacodyl  10 mg Rectal Daily  . dexmedetomidine (PRECEDEX) IV infusion for high rates  0.1-0.7 mcg/kg/hr Intravenous To OR  . docusate sodium  200 mg Oral Daily  . DOPamine  2-20 mcg/kg/min Intravenous To OR  . furosemide  80 mg Intravenous Once  . insulin aspart  0-24 Units Subcutaneous Q2H   Followed by  . insulin aspart  0-24 Units Subcutaneous Q4H  . insulin aspart  0-24 Units Subcutaneous Q4H  . insulin (NOVOLIN-R) infusion   Intravenous To OR  . metoprolol tartrate  12.5 mg Oral BID   Or  . metoprolol tartrate  12.5 mg Per Tube BID  . moxifloxacin  400 mg Intravenous To OR  . moxifloxacin  400 mg Intravenous Q24H  . nitroGLYCERIN  2-200 mcg/min Intravenous To OR  . nitroglycerin-nicardipine-HEPARIN-sodium bicarbonate irrigation for artery spasm   Irrigation To OR  . pantoprazole  40 mg Oral Q1200  . phenylephrine (NEO-SYNEPHRINE) Adult infusion  30-200 mcg/min Intravenous To OR  . simvastatin  40 mg Oral q1800  . sodium bicarbonate  100 mEq Intravenous Once  . sodium bicarbonate  50 mEq Intravenous Once  . sodium chloride  3 mL Intravenous Q12H  . tranexamic acid  15 mg/kg Intravenous To OR  . tranexamic acid (CYKLOKAPRON) infusion ([facility])  1.5 mg/kg/hr Intravenous To OR  . vancomycin  1,250 mg Intravenous To OR  . vancomycin  1,000 mg Intravenous Once  . DISCONTD: allopurinol  100 mg Oral BID  . DISCONTD: antithrombin III (human)  549 Units Intravenous To OR  . DISCONTD: aspirin  325 mg Oral Daily  . DISCONTD: atorvastatin  20 mg Oral q1800  . DISCONTD:  bisacodyl  5 mg Oral Once  . DISCONTD: calcitRIOL  0.25 mcg Oral Q M,W,F  . DISCONTD: cloNIDine  0.2 mg Oral QHS  . DISCONTD: enoxaparin  40 mg Subcutaneous Daily  . DISCONTD: epinephrine  0.5-20 mcg/min Intravenous To OR  . DISCONTD:  famotidine (PEPCID) IV  20 mg Intravenous Q12H  . DISCONTD: fenofibrate  160 mg Oral Daily  . DISCONTD: hydrALAZINE  25 mg Oral Daily  . DISCONTD: insulin regular  0-10 Units Intravenous TID WC  . DISCONTD: magnesium sulfate  40 mEq Other To OR  . DISCONTD: magnesium sulfate  4 g Intravenous Once  . DISCONTD: metoprolol  50 mg Oral QID  . DISCONTD: metoprolol tartrate  12.5 mg Oral Once  . DISCONTD: nisoldipine  17 mg Oral BID  . DISCONTD: omega-3 acid ethyl esters  3 g Oral BID  . DISCONTD: potassium chloride  10 mEq Intravenous Q1 Hr x 3  . DISCONTD: potassium chloride  80 mEq Other To OR  . DISCONTD: tranexamic acid  2 mg/kg Intracatheter To OR  . DISCONTD: Vitamin D (Ergocalciferol)  50,000 Units Oral Q7 days    BMET    Component Value Date/Time   NA 142 03/20/2012 0417   K 4.9 03/20/2012 0417   CL 109 03/20/2012 0417   CO2 22 03/20/2012 0417   GLUCOSE 126* 03/20/2012 0417   BUN 62* 03/20/2012 0417   CREATININE 6.51* 03/20/2012 0417   CALCIUM 7.9* 03/20/2012 0417   GFRNONAA 9* 03/20/2012 0417   GFRAA 10* 03/20/2012 0417   CBC    Component Value Date/Time   WBC 9.2 03/20/2012 0417   RBC 2.89* 03/20/2012 0417   HGB 8.6* 03/20/2012 0417   HCT 26.0* 03/20/2012 0417   PLT 136* 03/20/2012 0417   MCV 90.0 03/20/2012 0417   MCH 29.8 03/20/2012 0417   MCHC 33.1 03/20/2012 0417   RDW 14.5 03/20/2012 0417   LYMPHSABS 0.3* 11/05/2011 1111   MONOABS 0.4 11/05/2011 1111   EOSABS 0.1 11/05/2011 1111   BASOSABS 0.0 11/05/2011 1111    Assessment/Plan:  1. 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  Some post-op discomfort but otherwise ok 2. CKD stage 5- will follow renal function post CABG.  No sig change from baseline however we are only 24 hours out. No uremic symptoms but  will need to monitor closely as it is likely he will require RRT prior to LRD kidney transplant. Will follow 3. Hypokalemia- will replete orally 4. Vascular access- left AVF >73 year old but not quite as mature/developed. Appreciate VVS' input. Duplex revealed competing vessel in mid forearm, Dr. Myra Gianotti following. 5. HTN- stable 6. Dyslipidemia- on meds 7. 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 Dispo- awaiting CT surgery eval 9. Hypervolemia- increase Lasix  Josephmichael Lisenbee A

## 2012-03-20 NOTE — Progress Notes (Signed)
   CARE MANAGEMENT NOTE 03/20/2012  Patient:  ADOLFO, GRANIERI R   Account Number:  [id number]  Date Initiated:  03/20/2012  Documentation initiated by:  Lottie Sigman  Subjective/Objective Assessment:   PT S/P CABG X 5 ON 03/19/12.  PTA, PT INDEPENDENT, LIVES WITH SPOUSE AND CHILDREN.     Action/Plan:   MET WITH PT AND FAMILY TO DISCUSS DC PLANS.  WIFE TO PROVIDE CARE AT DC.  WILL FOLLOW FOR HOME NEEDS AS PT PROGRESSES.   Anticipated DC Date:  03/25/2012   Anticipated DC Plan:  HOME W HOME HEALTH SERVICES      DC Planning Services  CM consult      Choice offered to / List presented to:             Status of service:  In process, will continue to follow Medicare Important Message given?   (If response is "NO", the following Medicare IM given date fields will be blank) Date Medicare IM given:   Date Additional Medicare IM given:    Discharge Disposition:    Per UR Regulation:    If discussed at Long Length of Stay Meetings, dates discussed:    Comments:    Jerrell Belfast, RN, BSN Phone #[phone]

## 2012-03-21 ENCOUNTER — Inpatient Hospital Stay (HOSPITAL_COMMUNITY): Payer: 59

## 2012-03-21 LAB — GLUCOSE, CAPILLARY
Glucose-Capillary: 110 mg/dL — ABNORMAL HIGH (ref 70–99)
Glucose-Capillary: 130 mg/dL — ABNORMAL HIGH (ref 70–99)
Glucose-Capillary: 135 mg/dL — ABNORMAL HIGH (ref 70–99)
Glucose-Capillary: 146 mg/dL — ABNORMAL HIGH (ref 70–99)

## 2012-03-21 LAB — CBC
HCT: 27.3 % — ABNORMAL LOW (ref 39.0–52.0)
MCV: 91.9 fL (ref 78.0–100.0)
RDW: 14.7 % (ref 11.5–15.5)
WBC: 10.5 10*3/uL (ref 4.0–10.5)

## 2012-03-21 LAB — RENAL FUNCTION PANEL
Albumin: 2.6 g/dL — ABNORMAL LOW (ref 3.5–5.2)
BUN: 68 mg/dL — ABNORMAL HIGH (ref 6–23)
Chloride: 104 mEq/L (ref 96–112)
Creatinine, Ser: 7.38 mg/dL — ABNORMAL HIGH (ref 0.50–1.35)

## 2012-03-21 MED ORDER — AMIODARONE HCL IN DEXTROSE 360-4.14 MG/200ML-% IV SOLN
INTRAVENOUS | Status: AC
  Filled 2012-03-21: qty 200

## 2012-03-21 MED ORDER — SODIUM CHLORIDE 0.9 % IJ SOLN
3.0000 mL | Freq: Two times a day (BID) | INTRAMUSCULAR | Status: DC
  Administered 2012-03-21 – 2012-03-22 (×2): 3 mL via INTRAVENOUS

## 2012-03-21 MED ORDER — METOPROLOL TARTRATE 50 MG PO TABS: 50.0000 mg | ORAL_TABLET | Freq: Two times a day (BID) | ORAL | Status: DC

## 2012-03-21 MED ORDER — AMIODARONE HCL IN DEXTROSE 360-4.14 MG/200ML-% IV SOLN
1.0000 mg/min | INTRAVENOUS | Status: AC
  Administered 2012-03-21: 1 mg/min
  Administered 2012-03-21: 1 mg/min via INTRAVENOUS
  Administered 2012-03-21: 1 mg/min
  Administered 2012-03-21: 1 mg/min via INTRAVENOUS
  Filled 2012-03-21: qty 200

## 2012-03-21 MED ORDER — SODIUM CHLORIDE 0.9 % IJ SOLN: 3.0000 mL | INTRAMUSCULAR | Status: DC | PRN

## 2012-03-21 MED ORDER — AMIODARONE HCL IN DEXTROSE 360-4.14 MG/200ML-% IV SOLN
0.5000 mg/min | INTRAVENOUS | Status: DC
  Administered 2012-03-22: 0.5 mg/min via INTRAVENOUS
  Filled 2012-03-21 (×3): qty 200

## 2012-03-21 MED ORDER — AMIODARONE LOAD VIA INFUSION
150.0000 mg | Freq: Once | INTRAVENOUS | Status: AC
  Administered 2012-03-21: 150 mg via INTRAVENOUS
  Filled 2012-03-21: qty 83.34

## 2012-03-21 MED ORDER — METOPROLOL TARTRATE 50 MG PO TABS
50.0000 mg | ORAL_TABLET | Freq: Four times a day (QID) | ORAL | Status: DC
  Administered 2012-03-21 – 2012-03-25 (×16): 50 mg via ORAL
  Filled 2012-03-21 (×20): qty 1

## 2012-03-21 MED ORDER — METOCLOPRAMIDE HCL 5 MG/ML IJ SOLN
10.0000 mg | Freq: Once | INTRAMUSCULAR | Status: AC
  Administered 2012-03-21: 10 mg via INTRAVENOUS
  Filled 2012-03-21: qty 2

## 2012-03-21 MED ORDER — ASPIRIN EC 325 MG PO TBEC
325.0000 mg | DELAYED_RELEASE_TABLET | Freq: Every day | ORAL | Status: DC
  Administered 2012-03-21: 325 mg via ORAL
  Filled 2012-03-21 (×2): qty 1

## 2012-03-21 MED ORDER — CLONIDINE HCL 0.2 MG/24HR TD PTWK
0.2000 mg | MEDICATED_PATCH | TRANSDERMAL | Status: DC
  Administered 2012-03-21: 0.2 mg via TRANSDERMAL
  Filled 2012-03-21: qty 1

## 2012-03-21 MED ORDER — HYDRALAZINE HCL 25 MG PO TABS
25.0000 mg | ORAL_TABLET | Freq: Four times a day (QID) | ORAL | Status: DC
  Administered 2012-03-21 – 2012-03-25 (×15): 25 mg via ORAL
  Filled 2012-03-21 (×21): qty 1

## 2012-03-21 MED ORDER — NITROGLYCERIN IN D5W 200-5 MCG/ML-% IV SOLN
INTRAVENOUS | Status: AC
  Filled 2012-03-21: qty 250

## 2012-03-21 MED ORDER — FUROSEMIDE 10 MG/ML IJ SOLN
80.0000 mg | Freq: Three times a day (TID) | INTRAMUSCULAR | Status: DC
  Administered 2012-03-21 – 2012-03-22 (×5): 80 mg via INTRAVENOUS
  Filled 2012-03-21 (×8): qty 8

## 2012-03-21 MED ORDER — MORPHINE SULFATE 2 MG/ML IJ SOLN: 2.0000 mg | INTRAMUSCULAR | Status: DC | PRN

## 2012-03-21 MED ORDER — MOVING RIGHT ALONG BOOK
Freq: Once | Status: AC
  Administered 2012-03-21: 1
  Filled 2012-03-21: qty 1

## 2012-03-21 MED ORDER — INSULIN ASPART 100 UNIT/ML ~~LOC~~ SOLN
0.0000 [IU] | Freq: Three times a day (TID) | SUBCUTANEOUS | Status: DC
  Administered 2012-03-21 (×2): 2 [IU] via SUBCUTANEOUS

## 2012-03-21 MED ORDER — FUROSEMIDE 10 MG/ML IJ SOLN: 160.0000 mg | Freq: Two times a day (BID) | INTRAMUSCULAR | Status: DC

## 2012-03-21 MED ORDER — SODIUM CHLORIDE 0.9 % IV SOLN: 250.0000 mL | INTRAVENOUS | Status: DC | PRN

## 2012-03-21 MED ORDER — PANTOPRAZOLE SODIUM 40 MG IV SOLR
40.0000 mg | Freq: Once | INTRAVENOUS | Status: AC
  Administered 2012-03-21: 40 mg via INTRAVENOUS
  Filled 2012-03-21: qty 40

## 2012-03-21 NOTE — Progress Notes (Addendum)
   CARDIOTHORACIC SURGERY PROGRESS NOTE   R2 Days Post-Op Procedure(s) (LRB): CORONARY ARTERY BYPASS GRAFTING (CABG) (N/A)  Subjective: Feels nauseated but no emesis.  No abdominal pain.  Appropriate soreness in chest.  Objective: Vital signs: BP Readings from Last 1 Encounters:  03/21/12 150/93   Pulse Readings from Last 1 Encounters:  03/21/12 88   Resp Readings from Last 1 Encounters:  03/21/12 24   Temp Readings from Last 1 Encounters:  03/21/12 97.9 F (36.6 C) Oral    Hemodynamics: PAP: (45)/(31) 45/31 mmHg  Physical Exam:  Rhythm:   Sinus with PAC's  Breath sounds: clear  Heart sounds:  RRR  Incisions:  Clean and dry  Abdomen:  Soft, mildly distended  Extremities:  Warm, well perfused   Intake/Output from previous day: 04/19 0701 - 04/20 0700 In: 1539.2 [P.O.:640; I.V.:877.2; IV Piggyback:22] Out: 1970 [Urine:1840; Chest Tube:130] Intake/Output this shift:    Lab Results:  Basename 03/21/12 0415 03/20/12 1659  WBC 10.5 10.6*  HGB 8.8* 9.3*  HCT 27.3* 28.1*  PLT 160 158   BMET:  Basename 03/21/12 0415 03/20/12 1659 03/20/12 1658 03/20/12 0417  NA 139 -- 141 --  K 5.0 -- 4.9 --  CL 104 -- 112 --  CO2 24 -- -- 22  GLUCOSE 126* -- 130* --  BUN 68* -- 65* --  CREATININE 7.38* 7.06* -- --  CALCIUM 9.1 -- -- 7.9*    CBG (last 3)   Basename 03/21/12 0748 03/21/12 0410 03/21/12 0006  GLUCAP 117* 121* 135*   ABG    Component Value Date/Time   PHART 7.333* 03/19/2012 2201   HCO3 21.0 03/19/2012 2201   TCO2 22 03/20/2012 1658   ACIDBASEDEF 4.0* 03/19/2012 2201   O2SAT 93.0 03/19/2012 2201   CXR: *RADIOLOGY REPORT*  Clinical Data: 51 year old male status post cardiac surgery. Chest  tube removed.  PORTABLE CHEST - 1 VIEW  Comparison: 03/20/2012 and earlier.  Findings: Sitting upright AP view at 0559 hours. Bilateral chest  tubes and mediastinal tube have been removed. Swan-Ganz catheter  has been removed, right IJ introducer sheath and calf  border remain  in place. Epicardial pacer wires remain in place.  No pneumothorax identified. Perihilar plate-like opacity is  stable. No acute pulmonary edema. No large effusions. Stable  cardiac size and mediastinal contours. Sequelae of CABG.  Increased gaseous distention of the stomach in the upper abdomen.  IMPRESSION:  1. Chest tubes, mediastinal tube, and Swan-Ganz catheter removed.  2. No pneumothorax. Stable atelectasis.  3. Increased gaseous distention of the stomach.  Original Report Authenticated By: Harley Hallmark, M.D.   Assessment/Plan: S/P Procedure(s) (LRB): CORONARY ARTERY BYPASS GRAFTING (CABG) (N/A)  Overall stable POD2 Expected post op acute blood loss anemia, mild, stable Expected post op volume excess, mild, diuresing some Hypertension, severe, chronic Chronic kidney disease with essentially stable K+ BUN and creatinine Possible mild post op ileus developing   Back off on regular diet - clear liquids for now  Recheck KUB in am  Increase metoprolol, restart clonidine and hydralazine  Mobilize  Diuresis  Watch K+ BUN Creatinine  D/C foley   OWEN,CLARENCE H 03/21/2012 9:06 AM

## 2012-03-21 NOTE — Progress Notes (Signed)
[facility] BRIEF SICU PROGRESS NOTE  2 Days Post-Op  S/P Procedure(s) (LRB): CORONARY ARTERY BYPASS GRAFTING (CABG) (N/A)   Episode rapid Afib earlier Converted back to NSR on Amiodarone Still nauseated  Plan: Continue current plan  Harout Scheurich H 03/21/2012 5:38 PM

## 2012-03-21 NOTE — Progress Notes (Signed)
Patient ID: [name], male   DOB: 1961-09-10, 51 y.o.   MRN: [id number] S:Pt still feels sore but otherwise ok O:BP 151/91  Pulse 92  Temp(Src) 97.9 F (36.6 C) (Oral)  Resp 16  Ht 5\' 8"  (1.727 m)  Wt 93 kg (205 lb 0.4 oz)  BMI 31.17 kg/m2  SpO2 96%  Intake/Output Summary (Last 24 hours) at 03/21/12 0919 Last data filed at 03/21/12 0900  Gross per 24 hour  Intake 1432.13 ml  Output   2045 ml  Net -612.87 ml   Weight change: 1.373 kg (3 lb 0.4 oz) Gen:WD WN WM in NAD CVS:RRR Resp:poor inspiratory effort WUJ:WJXBJY NWG:NFAOZHY edema   Lab 03/21/12 0415 03/20/12 1659 03/20/12 1658 03/20/12 0417 03/19/12 2045 03/19/12 2040 03/19/12 1457 03/19/12 1335 03/19/12 1227 03/18/12 0509 03/17/12 0540  NA 139 -- 141 142 -- 141 139 140 136 -- --  K 5.0 -- 4.9 4.9 -- 5.2* 4.6 5.0 6.1* -- --  CL 104 -- 112 109 -- 113* -- -- -- 102 105  CO2 24 -- -- 22 -- -- -- -- -- 22 20  GLUCOSE 126* -- 130* 126* -- 129* 110* 134* 120* -- --  BUN 68* -- 65* 62* -- 60* -- -- -- 70* 76*  CREATININE 7.38* 7.06* 7.40* 6.51* 6.22* 6.70* -- -- -- 6.71* --  ALB -- -- -- -- -- -- -- -- -- -- --  CALCIUM 9.1 -- -- 7.9* -- -- -- -- -- 8.9 8.1*  PHOS 8.1* -- -- -- -- -- -- -- -- 5.4* 4.8*  AST -- -- -- -- -- -- -- -- -- -- --  ALT -- -- -- -- -- -- -- -- -- -- --   Liver Function Tests:  Lab 03/21/12 0415 03/18/12 0509 03/17/12 0540  AST -- -- --  ALT -- -- --  ALKPHOS -- -- --  BILITOT -- -- --  PROT -- -- --  ALBUMIN 2.6* 2.9* 2.8*   No results found for this basename: LIPASE:3,AMYLASE:3 in the last 168 hours No results found for this basename: AMMONIA:3 in the last 168 hours CBC:  Lab 03/21/12 0415 03/20/12 1659 03/20/12 1658 03/20/12 0417 03/19/12 2045 03/19/12 1447  WBC 10.5 10.6* -- 9.2 -- --  NEUTROABS -- -- -- -- -- --  HGB 8.8* 9.3* 8.5* -- -- --  HCT 27.3* 28.1* 25.0* -- -- --  MCV 91.9 91.2 -- 90.0 88.7 89.2  PLT 160 158 -- 136* -- --   Cardiac Enzymes: No results found for this  basename: CKTOTAL:5,CKMB:5,CKMBINDEX:5,TROPONINI:5 in the last 168 hours CBG:  Lab 03/21/12 0748 03/21/12 0410 03/21/12 0006 03/20/12 2008 03/20/12 1606  GLUCAP 117* 121* 135* 135* 141*    Iron Studies: No results found for this basename: IRON,TIBC,TRANSFERRIN,FERRITIN in the last 72 hours Studies/Results: Dg Chest Portable 1 View In Am  03/21/2012  *RADIOLOGY REPORT*  Clinical Data: 51 year old male status post cardiac surgery.  Chest tube removed.  PORTABLE CHEST - 1 VIEW  Comparison: 03/20/2012 and earlier.  Findings: Sitting upright AP view at 0559 hours.  Bilateral chest tubes and mediastinal tube have been removed.  Swan-Ganz catheter has been removed, right IJ introducer sheath and calf border remain in place.  Epicardial pacer wires remain in place. No pneumothorax identified.  Perihilar plate-like opacity is stable.  No acute pulmonary edema.  No large effusions.  Stable cardiac size and mediastinal contours.  Sequelae of CABG. Increased gaseous distention of the stomach in the upper abdomen.  IMPRESSION: 1.  Chest tubes, mediastinal tube, and Swan-Ganz catheter removed. 2.  No pneumothorax.  Stable atelectasis. 3.  Increased gaseous distention of the stomach.  Original Report Authenticated By: Harley Hallmark, M.D.   Dg Chest Portable 1 View In Am  03/20/2012  *RADIOLOGY REPORT*  Clinical Data: Coronary artery disease.  Status post CABG.  PORTABLE CHEST - 1 VIEW  Comparison: 03/19/2012  Findings: Endotracheal tube and NG tube have been removed.  Theone Murdoch catheter appears to be looped in the pulmonary outflow tract or main pulmonary artery.  There is a second central venous catheter with the tip in the superior vena cava.  Multiple chest tubes in place.  No pneumothorax.  Linear atelectasis in the upper lobes is unchanged.  IMPRESSION: No pneumothorax.  No change in the bilateral atelectasis.  Swan-Ganz catheter appears to be looped in the main pulmonary artery.  Original Report Authenticated  By: Gwynn Burly, M.D.   Dg Chest Portable 1 View  03/19/2012  *RADIOLOGY REPORT*  Clinical Data: Post CABG.  PORTABLE CHEST - 1 VIEW  Comparison: 03/17/2012.  Findings: Endotracheal tube is in satisfactory position. Right IJ central line tip projects over the SVC.  Right IJ Swan-Ganz catheter tip is coiled with the tip projecting toward the left pulmonary artery.  Nasogastric tube terminates in the stomach with the side port above the gastroesophageal junction.  Mediastinal drain,  bilateral chest tubes and epicardial pacer wires are in place.  Seven intact sternotomy wires.  Discoid opacities are seen in the midlung zones.  Lungs are otherwise clear.  No definite pneumothorax.  No pleural fluid.  IMPRESSION:  1.  Right IJ Swan-Ganz catheter tip is coiled with the tip projecting toward the left pulmonary artery. 2.  Bilateral chest tubes without pneumothorax. 3.  Bilateral mid lung zone atelectasis.  Original Report Authenticated By: Reyes Ivan, M.D.      . acetaminophen  1,000 mg Oral Q6H  . aspirin EC  325 mg Oral Daily  . bisacodyl  10 mg Oral Daily   Or  . bisacodyl  10 mg Rectal Daily  . cloNIDine  0.2 mg Transdermal Weekly  . docusate sodium  200 mg Oral Daily  . furosemide  160 mg Intravenous BID  . hydrALAZINE  25 mg Oral Q6H  . insulin aspart  0-24 Units Subcutaneous TID AC & HS  . metoprolol tartrate  50 mg Oral QID  . moving right along book   Does not apply Once  . pantoprazole  40 mg Oral Q1200  . simvastatin  40 mg Oral q1800  . sodium chloride  3 mL Intravenous Q12H  . DISCONTD: acetaminophen (TYLENOL) oral liquid 160 mg/5 mL  975 mg Per Tube Q6H  . DISCONTD: aspirin  324 mg Per Tube Daily  . DISCONTD: aspirin EC  325 mg Oral Daily  . DISCONTD: furosemide  80 mg Intravenous BID  . DISCONTD: insulin aspart  0-24 Units Subcutaneous Q4H  . DISCONTD: insulin aspart  0-24 Units Subcutaneous Q4H  . DISCONTD: metoprolol tartrate  50 mg Oral BID  . DISCONTD: metoprolol  tartrate  12.5 mg Per Tube BID  . DISCONTD: metoprolol tartrate  25 mg Per Tube BID  . DISCONTD: metoprolol tartrate  12.5 mg Oral BID  . DISCONTD: metoprolol tartrate  25 mg Oral BID  . DISCONTD: sodium chloride  3 mL Intravenous Q12H    BMET    Component Value Date/Time   NA 139 03/21/2012 0415   K  5.0 03/21/2012 0415   CL 104 03/21/2012 0415   CO2 24 03/21/2012 0415   GLUCOSE 126* 03/21/2012 0415   BUN 68* 03/21/2012 0415   CREATININE 7.38* 03/21/2012 0415   CALCIUM 9.1 03/21/2012 0415   GFRNONAA 8* 03/21/2012 0415   GFRAA 9* 03/21/2012 0415   CBC    Component Value Date/Time   WBC 10.5 03/21/2012 0415   RBC 2.97* 03/21/2012 0415   HGB 8.8* 03/21/2012 0415   HCT 27.3* 03/21/2012 0415   PLT 160 03/21/2012 0415   MCV 91.9 03/21/2012 0415   MCH 29.6 03/21/2012 0415   MCHC 32.2 03/21/2012 0415   RDW 14.7 03/21/2012 0415   LYMPHSABS 0.3* 11/05/2011 1111   MONOABS 0.4 11/05/2011 1111   EOSABS 0.1 11/05/2011 1111   BASOSABS 0.0 11/05/2011 1111    Assessment/Plan:  1. 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 Some post-op discomfort but otherwise ok 2. CKD stage 5- will follow renal function post CABG. No sig change from baseline. No uremic symptoms but will need to monitor closely as it is likely he will require RRT prior to LRD kidney transplant. Will follow 3. Hypokalemia- now hyperkalemia. Will stop po kcl 4. Vascular access- left AVF >86 year old but not quite as mature/developed. Appreciate VVS' input. Duplex revealed competing vessel in mid forearm, Dr. Myra Gianotti following. 5. HTN- stable 6. ABLA- cont to follow and tx prn.  Start epo 7. Dyslipidemia- on meds 8. 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 Dispo- awaiting CT surgery eval 10. Hypervolemia- increase Lasix Zainah Steven A

## 2012-03-22 ENCOUNTER — Inpatient Hospital Stay (HOSPITAL_COMMUNITY): Payer: 59

## 2012-03-22 LAB — RENAL FUNCTION PANEL
Albumin: 2.6 g/dL — ABNORMAL LOW (ref 3.5–5.2)
Calcium: 9.3 mg/dL (ref 8.4–10.5)
GFR calc Af Amer: 9 mL/min — ABNORMAL LOW (ref >90)
Glucose, Bld: 104 mg/dL — ABNORMAL HIGH (ref 70–99)
Phosphorus: 8.5 mg/dL — ABNORMAL HIGH (ref 2.3–4.6)
Potassium: 4.3 mEq/L (ref 3.5–5.1)
Sodium: 137 mEq/L (ref 135–145)

## 2012-03-22 LAB — CBC
Hemoglobin: 9 g/dL — ABNORMAL LOW (ref 13.0–17.0)
MCH: 29.7 pg (ref 26.0–34.0)
MCHC: 33.3 g/dL (ref 30.0–36.0)
RDW: 14.4 % (ref 11.5–15.5)

## 2012-03-22 LAB — GLUCOSE, CAPILLARY
Glucose-Capillary: 114 mg/dL — ABNORMAL HIGH (ref 70–99)
Glucose-Capillary: 110 mg/dL — ABNORMAL HIGH (ref 70–99)
Glucose-Capillary: 113 mg/dL — ABNORMAL HIGH (ref 70–99)

## 2012-03-22 MED ORDER — DARBEPOETIN ALFA-POLYSORBATE 40 MCG/0.4ML IJ SOLN
40.0000 ug | INTRAMUSCULAR | Status: DC
  Administered 2012-03-22: 40 ug via SUBCUTANEOUS
  Filled 2012-03-22: qty 0.4

## 2012-03-22 MED ORDER — CLOPIDOGREL BISULFATE 75 MG PO TABS
75.0000 mg | ORAL_TABLET | Freq: Every day | ORAL | Status: DC
  Administered 2012-03-22 – 2012-03-25 (×4): 75 mg via ORAL
  Filled 2012-03-22 (×4): qty 1

## 2012-03-22 MED ORDER — AMIODARONE HCL 200 MG PO TABS
200.0000 mg | ORAL_TABLET | Freq: Two times a day (BID) | ORAL | Status: DC
  Administered 2012-03-22 – 2012-03-24 (×4): 200 mg via ORAL
  Filled 2012-03-22 (×7): qty 1

## 2012-03-22 MED ORDER — ASPIRIN EC 81 MG PO TBEC
81.0000 mg | DELAYED_RELEASE_TABLET | Freq: Every day | ORAL | Status: DC
  Administered 2012-03-22 – 2012-03-25 (×4): 81 mg via ORAL
  Filled 2012-03-22 (×4): qty 1

## 2012-03-22 MED ORDER — ATORVASTATIN CALCIUM 20 MG PO TABS
20.0000 mg | ORAL_TABLET | Freq: Every day | ORAL | Status: DC
  Administered 2012-03-22 – 2012-03-24 (×3): 20 mg via ORAL
  Filled 2012-03-22 (×4): qty 1

## 2012-03-22 NOTE — Anesthesia Postprocedure Evaluation (Signed)
  Anesthesia Post-op Note  Patient: [name]  Procedure(s) Performed: Procedure(s) (LRB): CORONARY ARTERY BYPASS GRAFTING (CABG) (N/A)  Patient Location: PACU and SICU  Anesthesia Type: General  Level of Consciousness: awake  Airway and Oxygen Therapy: Patient Spontanous Breathing  Post-op Pain: mild  Post-op Assessment: Post-op Vital signs reviewed  Post-op Vital Signs: stable  Complications: No apparent anesthesia complications

## 2012-03-22 NOTE — Progress Notes (Signed)
Pt ambulated approximately 300 feet around the circle with oxygen at 2 liters [place] and pushing wheelchair.  Pt tolerated walk well without sob or any distress.  States that this was his first walk today because he had other issues to take care of "like his bowel". Pt resting in the bed at this time with call bell within reach.  Will continue to monitor. Thomas Hoff

## 2012-03-22 NOTE — Progress Notes (Signed)
   CARDIOTHORACIC SURGERY PROGRESS NOTE   R3 Days Post-Op Procedure(s) (LRB): CORONARY ARTERY BYPASS GRAFTING (CABG) (N/A)  Subjective: Looks and feels much better.  Nausea improved.  + bowel movement this morning.  Breathing better.  Objective: Vital signs: BP Readings from Last 1 Encounters:  03/22/12 129/83   Pulse Readings from Last 1 Encounters:  03/22/12 72   Resp Readings from Last 1 Encounters:  03/22/12 16   Temp Readings from Last 1 Encounters:  03/22/12 98.3 F (36.8 C) Oral    Hemodynamics:    Physical Exam:  Rhythm:   sinus  Breath sounds: clear  Heart sounds:  RRR  Incisions:  Clean and dry  Abdomen:  soft  Extremities:  Warm, swollen R>L   Intake/Output from previous day: 04/20 0701 - 04/21 0700 In: 1412.7 [P.O.:800; I.V.:607.7; IV Piggyback:5] Out: 2225 [Urine:2225] Intake/Output this shift:    Lab Results:  Basename 03/22/12 0350 03/21/12 0415  WBC 6.5 10.5  HGB 9.0* 8.8*  HCT 27.0* 27.3*  PLT 141* 160   BMET:  Basename 03/22/12 0350 03/21/12 0415  NA 137 139  K 4.3 5.0  CL 99 104  CO2 24 24  GLUCOSE 104* 126*  BUN 70* 68*  CREATININE 7.72* 7.38*  CALCIUM 9.3 9.1    CBG (last 3)   Basename 03/22/12 0717 03/22/12 0342 03/22/12 0011  GLUCAP 110* 103* 114*   ABG    Component Value Date/Time   PHART 7.333* 03/19/2012 2201   HCO3 21.0 03/19/2012 2201   TCO2 22 03/20/2012 1658   ACIDBASEDEF 4.0* 03/19/2012 2201   O2SAT 93.0 03/19/2012 2201   CXR/KUB: *RADIOLOGY REPORT*  Clinical Data: Evaluate for ileus, history of bypass surgery 2 days  ago now with decreased bowel sounds  ACUTE ABDOMEN SERIES (ABDOMEN 2 VIEW & CHEST 1 VIEW)  Comparison: Chest radiograph and 03/21/2012; 03/20/2012  Findings:  Grossly unchanged enlarged cardiac silhouette and mediastinal  contours post median sternotomy and CABG. Stable position of  support apparatus. No pneumothorax. Grossly unchanged bilateral  midlung linear heterogeneous opacities  favored to represent  subsegmental atelectasis. Unchanged small bilateral effusions,  right greater than left.  Nonobstructive bowel gas pattern. Likely post mid/lower anterior  abdominal wall hernia repair. No pneumoperitoneum, pneumatosis or  portal venous gas. No acute osseous abnormalities.  IMPRESSION:  1. Stable positioning of support apparatus. No pneumothorax.  2. Grossly unchanged decreased lung volumes with small bilateral  effusions and bibasilar opacities, atelectasis versus infiltrate.  2. Nonobstructive bowel gas pattern.  Original Report Authenticated By: Waynard Reeds, M.D.   Assessment/Plan: S/P Procedure(s) (LRB): CORONARY ARTERY BYPASS GRAFTING (CABG) (N/A)  Doing fairly well HTN under much better control Nausea, ileus resolved End-stage chronic kidney disease with urine output fairly good, but BUN/creatinine still rising Expected post op acute blood loss anemia, mild, stable Expected post op volume excess, mild, diuresing Post-op Afib, maintaining NSR on amiodarone   Mobilize  Convert amiodarone to po  Transfer step down  Lasix dosing and management of renal failure per Nephrology   Kimm Ungaro H 03/22/2012 9:22 AM

## 2012-03-22 NOTE — Progress Notes (Signed)
Patient ID: [name], male   DOB: 1961-10-21, 51 y.o.   MRN: [id number] S:feels "ok" O:BP 129/83  Pulse 72  Temp(Src) 98.3 F (36.8 C) (Oral)  Resp 16  Ht 5\' 8"  (1.727 m)  Wt 92.4 kg (203 lb 11.3 oz)  BMI 30.97 kg/m2  SpO2 95%  Intake/Output Summary (Last 24 hours) at 03/22/12 0939 Last data filed at 03/22/12 0600  Gross per 24 hour  Intake 1262.7 ml  Output   2075 ml  Net -812.3 ml   Weight change: -0.6 kg (-1 lb 5.2 oz) Gen:WD WN WM in NAD CVS:RRR Resp:bibasilar crackles WUJ:WJXBJY NWG:NFAOZ to 1+ edema   Lab 03/22/12 0350 03/21/12 0415 03/20/12 1659 03/20/12 1658 03/20/12 0417 03/19/12 2045 03/19/12 2040 03/19/12 1457 03/19/12 1335 03/18/12 0509 03/17/12 0540  NA 137 139 -- 141 142 -- 141 139 140 -- --  K 4.3 5.0 -- 4.9 4.9 -- 5.2* 4.6 5.0 -- --  CL 99 104 -- 112 109 -- 113* -- -- 102 105  CO2 24 24 -- -- 22 -- -- -- -- 22 20  GLUCOSE 104* 126* -- 130* 126* -- 129* 110* 134* -- --  BUN 70* 68* -- 65* 62* -- 60* -- -- 70* 76*  CREATININE 7.72* 7.38* 7.06* 7.40* 6.51* 6.22* 6.70* -- -- -- --  ALBUMIN 2.6* 2.6* -- -- -- -- -- -- -- 2.9* 2.8*  CALCIUM 9.3 9.1 -- -- 7.9* -- -- -- -- 8.9 8.1*  PHOS 8.5* 8.1* -- -- -- -- -- -- -- 5.4* 4.8*  AST -- -- -- -- -- -- -- -- -- -- --  ALT -- -- -- -- -- -- -- -- -- -- --   Liver Function Tests:  Lab 03/22/12 0350 03/21/12 0415 03/18/12 0509  AST -- -- --  ALT -- -- --  ALKPHOS -- -- --  BILITOT -- -- --  PROT -- -- --  ALBUMIN 2.6* 2.6* 2.9*   No results found for this basename: LIPASE:3,AMYLASE:3 in the last 168 hours No results found for this basename: AMMONIA:3 in the last 168 hours CBC:  Lab 03/22/12 0350 03/21/12 0415 03/20/12 1659 03/20/12 0417 03/19/12 2045  WBC 6.5 10.5 10.6* -- --  NEUTROABS -- -- -- -- --  HGB 9.0* 8.8* 9.3* -- --  HCT 27.0* 27.3* 28.1* -- --  MCV 89.1 91.9 91.2 90.0 88.7  PLT 141* 160 158 -- --   Cardiac Enzymes: No results found for this basename:  CKTOTAL:5,CKMB:5,CKMBINDEX:5,TROPONINI:5 in the last 168 hours CBG:  Lab 03/22/12 0717 03/22/12 0342 03/22/12 0011 03/21/12 2005 03/21/12 1603  GLUCAP 110* 103* 114* 110* 130*    Iron Studies: No results found for this basename: IRON,TIBC,TRANSFERRIN,FERRITIN in the last 72 hours Studies/Results: Dg Chest Portable 1 View In Am  03/21/2012  *RADIOLOGY REPORT*  Clinical Data: 51 year old male status post cardiac surgery.  Chest tube removed.  PORTABLE CHEST - 1 VIEW  Comparison: 03/20/2012 and earlier.  Findings: Sitting upright AP view at 0559 hours.  Bilateral chest tubes and mediastinal tube have been removed.  Swan-Ganz catheter has been removed, right IJ introducer sheath and calf border remain in place.  Epicardial pacer wires remain in place. No pneumothorax identified.  Perihilar plate-like opacity is stable.  No acute pulmonary edema.  No large effusions.  Stable cardiac size and mediastinal contours.  Sequelae of CABG. Increased gaseous distention of the stomach in the upper abdomen.  IMPRESSION: 1.  Chest tubes, mediastinal tube, and Swan-Ganz catheter removed.  2.  No pneumothorax.  Stable atelectasis. 3.  Increased gaseous distention of the stomach.  Original Report Authenticated By: Harley Hallmark, M.D.   Dg Abd Acute W/chest  03/22/2012  *RADIOLOGY REPORT*  Clinical Data: Evaluate for ileus, history of bypass surgery 2 days ago now with decreased bowel sounds  ACUTE ABDOMEN SERIES (ABDOMEN 2 VIEW & CHEST 1 VIEW)  Comparison: Chest radiograph and 03/21/2012; 03/20/2012  Findings:  Grossly unchanged enlarged cardiac silhouette and mediastinal contours post median sternotomy and CABG.  Stable position of support apparatus.  No pneumothorax.  Grossly unchanged bilateral midlung linear heterogeneous opacities favored to represent subsegmental atelectasis.  Unchanged small bilateral effusions, right greater than left.  Nonobstructive bowel gas pattern.  Likely post mid/lower anterior abdominal  wall hernia repair.  No pneumoperitoneum, pneumatosis or portal venous gas.  No acute osseous abnormalities.  IMPRESSION: 1.  Stable positioning of support apparatus.  No pneumothorax. 2.  Grossly unchanged decreased lung volumes with small bilateral effusions and bibasilar opacities, atelectasis versus infiltrate.  2.  Nonobstructive bowel gas pattern.  Original Report Authenticated By: Waynard Reeds, M.D.      . acetaminophen  1,000 mg Oral Q6H  . amiodarone (NEXTERONE PREMIX) 360 mg/200 mL dextrose      . amiodarone  150 mg Intravenous Once  . amiodarone  200 mg Oral BID PC  . aspirin EC  325 mg Oral Daily  . bisacodyl  10 mg Oral Daily   Or  . bisacodyl  10 mg Rectal Daily  . cloNIDine  0.2 mg Transdermal Weekly  . docusate sodium  200 mg Oral Daily  . furosemide  80 mg Intravenous TID  . hydrALAZINE  25 mg Oral Q6H  . insulin aspart  0-24 Units Subcutaneous TID AC & HS  . metoCLOPramide (REGLAN) injection  10 mg Intravenous Once  . metoprolol tartrate  50 mg Oral QID  . moving right along book   Does not apply Once  . nitroGLYCERIN      . pantoprazole  40 mg Oral Q1200  . pantoprazole (PROTONIX) IV  40 mg Intravenous Once  . simvastatin  40 mg Oral q1800  . sodium chloride  3 mL Intravenous Q12H    BMET    Component Value Date/Time   NA 137 03/22/2012 0350   K 4.3 03/22/2012 0350   CL 99 03/22/2012 0350   CO2 24 03/22/2012 0350   GLUCOSE 104* 03/22/2012 0350   BUN 70* 03/22/2012 0350   CREATININE 7.72* 03/22/2012 0350   CALCIUM 9.3 03/22/2012 0350   GFRNONAA 7* 03/22/2012 0350   GFRAA 9* 03/22/2012 0350   CBC    Component Value Date/Time   WBC 6.5 03/22/2012 0350   RBC 3.03* 03/22/2012 0350   HGB 9.0* 03/22/2012 0350   HCT 27.0* 03/22/2012 0350   PLT 141* 03/22/2012 0350   MCV 89.1 03/22/2012 0350   MCH 29.7 03/22/2012 0350   MCHC 33.3 03/22/2012 0350   RDW 14.4 03/22/2012 0350   LYMPHSABS 0.3* 11/05/2011 1111   MONOABS 0.4 11/05/2011 1111   EOSABS 0.1 11/05/2011 1111    BASOSABS 0.0 11/05/2011 1111    Assessment/Plan:  1. 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 Some post-op discomfort but otherwise ok 2. CKD stage 5- will follow renal function post CABG. BUN/Cr still slightly increasing.  No sig uremic symptoms but will need to monitor closely as it is likely he will require RRT prior to LRD kidney transplant. Will follow 3. Hypokalemia- now  hyperkalemia. Will stop po kcl 4. Vascular access- left AVF >65 year old but not quite as mature/developed. Appreciate [facility]' input. Duplex revealed competing vessel in mid forearm, Dr. Myra Gianotti following but feel fistula is usable if needed. 5. HTN- stable 6. ABLA- cont to follow and tx prn. Start epo 7. Dyslipidemia- on meds 8. 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 Hypervolemia- increase Lasix 10. Dispo- per CT surgery  Montrez [place] A

## 2012-03-23 LAB — RENAL FUNCTION PANEL
Albumin: 2.4 g/dL — ABNORMAL LOW (ref 3.5–5.2)
BUN: 79 mg/dL — ABNORMAL HIGH (ref 6–23)
CO2: 25 mEq/L (ref 19–32)
Chloride: 98 mEq/L (ref 96–112)
Creatinine, Ser: 8.06 mg/dL — ABNORMAL HIGH (ref 0.50–1.35)
GFR calc non Af Amer: 7 mL/min — ABNORMAL LOW (ref >90)
Potassium: 3.6 mEq/L (ref 3.5–5.1)

## 2012-03-23 LAB — CBC
Hemoglobin: 8.6 g/dL — ABNORMAL LOW (ref 13.0–17.0)
MCH: 29.6 pg (ref 26.0–34.0)
MCV: 89 fL (ref 78.0–100.0)
Platelets: 148 10*3/uL — ABNORMAL LOW (ref 150–400)
RBC: 2.91 MIL/uL — ABNORMAL LOW (ref 4.22–5.81)
WBC: 6 10*3/uL (ref 4.0–10.5)

## 2012-03-23 MED ORDER — OXYCODONE HCL 5 MG PO TABS: 5.0000 mg | ORAL_TABLET | ORAL | Status: DC | PRN

## 2012-03-23 MED ORDER — ATORVASTATIN CALCIUM 20 MG PO TABS: 20.0000 mg | ORAL_TABLET | Freq: Every day | ORAL | Status: DC

## 2012-03-23 MED ORDER — AMIODARONE HCL 200 MG PO TABS: 200.0000 mg | ORAL_TABLET | Freq: Two times a day (BID) | ORAL | Status: DC

## 2012-03-23 MED ORDER — FUROSEMIDE 80 MG PO TABS: 80.0000 mg | ORAL_TABLET | Freq: Three times a day (TID) | ORAL | Status: DC

## 2012-03-23 MED ORDER — METOPROLOL TARTRATE 50 MG PO TABS: 50.0000 mg | ORAL_TABLET | Freq: Four times a day (QID) | ORAL | Status: DC

## 2012-03-23 MED ORDER — CLONIDINE HCL 0.2 MG/24HR TD PTWK: 1.0000 | MEDICATED_PATCH | TRANSDERMAL | Status: DC

## 2012-03-23 MED ORDER — ASPIRIN 81 MG PO TBEC: 81.0000 mg | DELAYED_RELEASE_TABLET | Freq: Every day | ORAL | Status: DC

## 2012-03-23 MED ORDER — HYDRALAZINE HCL 25 MG PO TABS: 25.0000 mg | ORAL_TABLET | Freq: Four times a day (QID) | ORAL | Status: DC

## 2012-03-23 MED ORDER — FUROSEMIDE 80 MG PO TABS
80.0000 mg | ORAL_TABLET | Freq: Three times a day (TID) | ORAL | Status: DC
  Administered 2012-03-23 – 2012-03-25 (×6): 80 mg via ORAL
  Filled 2012-03-23 (×11): qty 1

## 2012-03-23 MED FILL — Sodium Bicarbonate IV Soln 8.4%: INTRAVENOUS | Qty: 50 | Status: AC

## 2012-03-23 MED FILL — Sodium Chloride Irrigation Soln 0.9%: Qty: 3000 | Status: AC

## 2012-03-23 MED FILL — Heparin Sodium (Porcine) Inj 1000 Unit/ML: INTRAMUSCULAR | Qty: 10 | Status: AC

## 2012-03-23 MED FILL — Potassium Acetate Inj 2 mEq/ML: INTRAVENOUS | Qty: 40 | Status: AC

## 2012-03-23 MED FILL — Mannitol IV Soln 20%: INTRAVENOUS | Qty: 500 | Status: AC

## 2012-03-23 MED FILL — Sodium Chloride IV Soln 0.9%: INTRAVENOUS | Qty: 1000 | Status: AC

## 2012-03-23 MED FILL — Magnesium Sulfate Inj 50%: INTRAMUSCULAR | Qty: 10 | Status: AC

## 2012-03-23 MED FILL — Albumin, Human Inj 5%: INTRAVENOUS | Qty: 250 | Status: AC

## 2012-03-23 MED FILL — Lidocaine HCl IV Inj 20 MG/ML: INTRAVENOUS | Qty: 5 | Status: AC

## 2012-03-23 MED FILL — Electrolyte-R (PH 7.4) Solution: INTRAVENOUS | Qty: 5000 | Status: AC

## 2012-03-23 NOTE — Discharge Instructions (Signed)
Endoscopic Saphenous Vein Harvesting Care After Refer to this sheet in the next few weeks. These instructions provide you with information on caring for yourself after your procedure. Your caregiver may also give you more specific instructions. Your treatment has been planned according to current medical practices, but problems sometimes occur. Call your caregiver if you have any problems or questions after your procedure. HOME CARE INSTRUCTIONS Medicine  Take whatever pain medicine your surgeon prescribes. Follow the directions carefully. Do not take over-the-counter pain medicine unless your surgeon says it is okay. Some pain medicine can cause bleeding problems for several weeks after surgery.   Follow your surgeon's instructions about driving. You will probably not be permitted to drive after heart surgery.   Take any medicines your surgeon prescribes. Any medicines you took before your heart surgery should be checked with your caregiver before you start taking them again.  Wound care  Ask your surgeon how long you should keep wearing your elastic bandage or stocking.   Check the area around your surgical cuts (incisions) whenever your bandages (dressings) are changed. Look for any redness or swelling.   You will need to return to have the stitches (sutures) or staples taken out. Ask your surgeon when to do that.   Ask your surgeon when you can shower or bathe.  Activity  Try to keep your legs raised when you are sitting.   Do any exercises your caregivers have given you. These may include deep breathing exercises, coughing, walking, or other exercises.  SEEK MEDICAL CARE IF:  You have any questions about your medicines.   You have more leg pain, especially if your pain medicine stops working.   New or growing bruises develop on your leg.   Your leg swells, feels tight, or becomes red.   You have numbness in your leg.  SEEK IMMEDIATE MEDICAL CARE IF:  Your pain gets much  worse.   Blood or fluid leaks from any of the incisions.   Your incisions become warm, swollen, or red.   You have chest pain.   You have trouble breathing.   You have a fever.   You have more pain near your leg incision.  MAKE SURE YOU:  Understand these instructions.   Will watch your condition.   Will get help right away if you are not doing well or get worse.  Document Released: 07/31/2011 Document Revised: 11/07/2011 Document Reviewed: 07/31/2011 [employer] Patient Information 2012 [employer].Endoscopic Saphenous Vein Harvesting Care After Refer to this sheet in the next few weeks. These instructions provide you with information on caring for yourself after your procedure. Your caregiver may also give you more specific instructions. Your treatment has been planned according to current medical practices, but problems sometimes occur. Call your caregiver if you have any problems or questions after your procedure. HOME CARE INSTRUCTIONS Medicine  Take whatever pain medicine your surgeon prescribes. Follow the directions carefully. Do not take over-the-counter pain medicine unless your surgeon says it is okay. Some pain medicine can cause bleeding problems for several weeks after surgery.   Follow your surgeon's instructions about driving. You will probably not be permitted to drive after heart surgery.   Take any medicines your surgeon prescribes. Any medicines you took before your heart surgery should be checked with your caregiver before you start taking them again.  Wound care  Ask your surgeon how long you should keep wearing your elastic bandage or stocking.   Check the area around your surgical cuts (  incisions) whenever your bandages (dressings) are changed. Look for any redness or swelling.   You will need to return to have the stitches (sutures) or staples taken out. Ask your surgeon when to do that.   Ask your surgeon when you can shower or bathe.   Activity  Try to keep your legs raised when you are sitting.   Do any exercises your caregivers have given you. These may include deep breathing exercises, coughing, walking, or other exercises.  SEEK MEDICAL CARE IF:  You have any questions about your medicines.   You have more leg pain, especially if your pain medicine stops working.   New or growing bruises develop on your leg.   Your leg swells, feels tight, or becomes red.   You have numbness in your leg.  SEEK IMMEDIATE MEDICAL CARE IF:  Your pain gets much worse.   Blood or fluid leaks from any of the incisions.   Your incisions become warm, swollen, or red.   You have chest pain.   You have trouble breathing.   You have a fever.   You have more pain near your leg incision.  MAKE SURE YOU:  Understand these instructions.   Will watch your condition.   Will get help right away if you are not doing well or get worse.  Document Released: 07/31/2011 Document Revised: 11/07/2011 Document Reviewed: 07/31/2011 [employer] Patient Information 2012 [employer].Coronary Artery Bypass Grafting Care After Refer to this sheet in the next few weeks. These instructions provide you with information on caring for yourself after your procedure. Your caregiver may also give you more specific instructions. Your treatment has been planned according to current medical practices, but problems sometimes occur. Call your caregiver if you have any problems or questions after your procedure.  Recovery from open heart surgery will be different for everyone. Some people feel well after 3 or 4 weeks, while for others it takes longer. After heart surgery, it may be normal to:  Not have an appetite, feel nauseated by the smell of food, or only want to eat a small amount.   Be constipated because of changes in your diet, activity, and medicines. Eat foods high in fiber. Add fresh fruits and vegetables to your diet. Stool softeners may be  helpful.   Feel sad or unhappy. You may be frustrated or cranky. You may have good days and bad days. Do not give up. Talk to your caregiver if you do not feel better.   Feel weakness and fatigue. You many need physical therapy or cardiac rehabilitation to get your strength back.   Develop an irregular heartbeat called atrial fibrillation. Symptoms of atrial fibrillation are a fast, irregular heartbeat or feelings of fluttery heartbeats, shortness of breath, low blood pressure, and dizziness. If these symptoms develop, see your caregiver right away.  MEDICATION  Have a list of all the medicines you will be taking when you leave the hospital. For every medicine, know the following:   Name.   Exact dose.   Time of day to be taken.   How often it should be taken.   Why you are taking it.   Ask which medicines should or should not be taken together. If you take more than one heart medicine, ask if it is okay to take them together. Some heart medicines should not be taken at the same time because they may lower your blood pressure too much.   Narcotic pain medicine can cause constipation. Eat fresh fruits   and vegetables. Add fiber to your diet. Stool softener medicine may help relieve constipation.   Keep a copy of your medicines with you at all times.   Do not add or stop taking any medicine until you check with your caregiver.   Medicines can have side effects. Call your caregiver who prescribed the medicine if you:   Start throwing up, have diarrhea, or have stomach pain.   Feel dizzy or lightheaded when you stand up.   Feel your heart is skipping beats or is beating too fast or too slow.   Develop a rash.   Notice unusual bruising or bleeding.  HOME CARE INSTRUCTIONS  After heart surgery, it is important to learn how to take your pulse. Have your caregiver show you how to take your pulse.   Use your incentive spirometer. Ask your caregiver how long after surgery you need to  use it.  Care of your chest incision  Tell your caregiver right away if you notice clicking in your chest (sternum).   Support your chest with a pillow or your arms when you take deep breaths and cough.   Follow your caregiver's instructions about when you can bathe or swim.   Protect your incision from sunlight during the first year to keep the scar from getting dark.   Tell your caregiver if you notice:   Increased tenderness of your incision.   Increased redness or swelling around your incision.   Drainage or pus from your incision.  Care of your leg incision(s)  Avoid crossing your legs.   Avoid sitting for long periods of time. Change positions every half hour.   Elevate your leg(s) when you are sitting.   Check your leg(s) daily for swelling. Check the incisions for redness or drainage.   Wear your elastic stockings as told by your caregiver. Take them off at bedtime.  Diet  Diet is very important to heart health.   Eat plenty of fresh fruits and vegetables. Meats should be lean cut. Avoid canned, processed, and fried foods.   Talk to a dietician. They can teach you how to make healthy food and drink choices.  Weight  Weigh yourself every day. This is important because it helps to know if you are retaining fluid that may make your heart and lungs work harder.   Use the same scale each time.   Weigh yourself every morning at the same time. You should do this after you go to the bathroom, but before you eat breakfast.   Your weight will be more accurate if you do not wear any clothes.   Record your weight.   Tell your caregiver if you have gained 2 pounds or more overnight.  Activity Stop any activity at once if you have chest pain, shortness of breath, irregular heartbeats, or dizziness. Get help right away if you have any of these symptoms.  Bathing.  Avoid soaking in a bath or hot tub until your incisions are healed.   Rest. You need a balance of rest and  activity.   Exercise. Exercise per your caregiver's advice. You may need physical therapy or cardiac rehabilitation to help strengthen your muscles and build your endurance.   Climbing stairs. Unless your caregiver tells you not to climb stairs, go up stairs slowly and rest if you tire. Do not pull yourself up by the handrail.   Driving a car. Follow your caregiver's advice on when you may drive. You may ride as a passenger at any   time. When traveling for long periods of time in a car, get out of the car and walk around for a few minutes every 2 hours.   Lifting. Avoid lifting, pushing, or pulling anything heavier than 10 pounds for 6 weeks after surgery or as told by your caregiver.   Returning to work. Check with your caregiver. People heal at different rates. Most people will be able to go back to work 6 to 12 weeks after surgery.   Sexual activity. You may resume sexual relations as told by your caregiver.  SEEK MEDICAL CARE IF:  Any of your incisions are red, painful, or have any type of drainage coming from them.   You have an oral temperature above 102 F (38.9 C).   You have ankle or leg swelling.   You have pain in your legs.   You have weight gain of 2 or more pounds a day.   You feel dizzy or lightheaded when you stand up.  SEEK IMMEDIATE MEDICAL CARE IF:  You have angina or chest pain that goes to your jaw or arms. Call your local emergency services right away.   You have shortness of breath at rest or with activity.   You have a fast or irregular heartbeat (arrhythmia).   There is a "clicking" in your sternum when you move.   You have numbness or weakness in your arms or legs.  MAKE SURE YOU:  Understand these instructions.   Will watch your condition.   Will get help right away if you are not doing well or get worse.  Document Released: 06/07/2005 Document Revised: 11/07/2011 Document Reviewed: 01/23/2011 [employer] Patient Information 2012 [employer]. 

## 2012-03-23 NOTE — Progress Notes (Signed)
Pt ambulated independently 660 ft on tht unit with wife. No complaints and tolerated it well

## 2012-03-23 NOTE — Progress Notes (Addendum)
4 Days Post-Op Procedure(s) (LRB): CORONARY ARTERY BYPASS GRAFTING (CABG) (N/A) Subjective:  Mr. [name] complains of fatigue this morning.  He states that he is constantly being bothered throughout the evening.    Objective: Vital signs in last 24 hours: Temp:  [97.7 F (36.5 C)-98.3 F (36.8 C)] 97.7 F (36.5 C) (04/22 0426) Pulse Rate:  [59-72] 62  (04/22 0426) Cardiac Rhythm:  [-] Normal sinus rhythm (04/21 1945) Resp:  [11-17] 17  (04/22 0426) BP: (108-150)/(69-87) 120/69 mmHg (04/22 0558) SpO2:  [93 %-99 %] 95 % (04/22 0426) Weight:  [197 lb 14.4 oz (89.767 kg)] 197 lb 14.4 oz (89.767 kg) (04/22 0449)    Intake/Output from previous day: 04/21 0701 - 04/22 0700 In: 975.1 [P.O.:925; I.V.:50.1] Out: 3475 [Urine:3075; Stool:400]    General appearance: alert, cooperative and no distress Heart: regular rate and rhythm Lungs: clear to auscultation bilaterally Abdomen: soft, non-tender; bowel sounds normal; no masses,  no organomegaly Extremities: edema 1+ Wound: clean and dry  Lab Results:  [facility] 03/23/12 0615 03/22/12 0350  WBC 6.0 6.5  HGB 8.6* 9.0*  HCT 25.9* 27.0*  PLT 148* 141*   BMET:  Basename 03/23/12 0615 03/22/12 0350  NA 137 137  K 3.6 4.3  CL 98 99  CO2 25 24  GLUCOSE 92 104*  BUN 79* 70*  CREATININE 8.06* 7.72*  CALCIUM 9.0 9.3    PT/INR: No results found for this basename: LABPROT,INR in the last 72 hours ABG    Component Value Date/Time   PHART 7.333* 03/19/2012 2201   HCO3 21.0 03/19/2012 2201   TCO2 22 03/20/2012 1658   ACIDBASEDEF 4.0* 03/19/2012 2201   O2SAT 93.0 03/19/2012 2201   CBG (last 3)   Basename 03/23/12 0551 03/22/12 2049 03/22/12 1610  GLUCAP 106* 113* 117*    Assessment/Plan: S/P Procedure(s) (LRB): CORONARY ARTERY BYPASS GRAFTING (CABG) (N/A)  2. CV- post of A.Fib, sustaining NSR with PVCs, will continue Amiodarone, Metoprolol 3. Resp- wean off oxygen as tolerated, aggressive IS 4. D/C CBGS and fingersticks, patient  not a diabetic 5. CKD- nephrology following 6. Fluid Balance- patient with good UO, diuresis per nephrology team 7. Dispo- patient fatigued today, however doing better, will likely be stable for d/c home in next 24-48 hours   LOS: 7 days    BARRETT, ERIN 03/23/2012   I have seen and examined the patient and agree with the assessment and plan as outlined.  Assuming that he remains in sinus rhythm, the decision regarding d/c home will depend upon renal function and recommendations from Nephrology team.  [name] 03/23/2012 1:26 PM

## 2012-03-23 NOTE — Progress Notes (Signed)
[facility] ROUNDING NOTE   Subjective:   Interval History: none.  Objective:  Vital signs in last 24 hours:  Temp:  [97.7 F (36.5 C)-98.3 F (36.8 C)] 97.7 F (36.5 C) (04/22 0426) Pulse Rate:  [59-72] 62  (04/22 0426) Resp:  [11-22] 17  (04/22 0426) BP: (107-150)/(69-89) 120/69 mmHg (04/22 0558) SpO2:  [93 %-99 %] 95 % (04/22 0426) Weight:  [89.767 kg (197 lb 14.4 oz)] 89.767 kg (197 lb 14.4 oz) (04/22 0449)  Weight change: -2.633 kg (-5 lb 12.9 oz) Filed Weights   03/21/12 0545 03/22/12 0600 03/23/12 0449  Weight: 93 kg (205 lb 0.4 oz) 92.4 kg (203 lb 11.3 oz) 89.767 kg (197 lb 14.4 oz)    Intake/Output: I/O last 3 completed shifts: In: 1230.5 [P.O.:975; I.V.:250.5; IV Piggyback:5] Out: 4600 [Urine:4200; Stool:400]   Intake/Output this shift:     General: Well developed, well nourished, male in no acute distress.  Head: Normocephalic, atraumati Neck:no carotid bruits. JVD not elevated. Lungs: Good expansion bilaterally. bilateral without wheezes or rhonchi. No Rales  Heart: Regular rate and rhythm with S1 S2. No S3 or S4. No murmur, no rubs, or gallops appreciated. Bandage covering chest incision without bloody drainage Abdomen: Soft, non-tender, non-distended   Extremities: No clubbing or cyanosis. 1+ edema. Distal pedal pulses are 2+ and equal bilaterally. No femoral bruits Left Cimino T +B Neuro: Alert and oriented X 3. Moves all extremities spontaneously. No focal deficits noted.  Psych: Responds to questions appropriately with a normal affect.  Skin: No rashes or lesions noted some freckles over forearm    Basic Metabolic Panel:  Lab 03/22/12 9604 03/21/12 0415 03/20/12 1659 03/20/12 1658 03/20/12 0417 03/19/12 2045 03/19/12 2040 03/18/12 0509 03/17/12 0540  NA 137 139 -- 141 142 -- 141 -- --  K 4.3 5.0 -- 4.9 4.9 -- 5.2* -- --  CL 99 104 -- 112 109 -- 113* -- --  CO2 24 24 -- -- 22 -- -- 22 20  GLUCOSE 104* 126* -- 130* 126* -- 129* -- --    BUN 70* 68* -- 65* 62* -- 60* -- --  CREATININE 7.72* 7.38* 7.06* 7.40* 6.51* -- -- -- --  CALCIUM 9.3 9.1 -- -- 7.9* -- -- -- --  MG -- -- 2.4 -- 2.3 2.3 -- -- --  PHOS 8.5* 8.1* -- -- -- -- -- 5.4* 4.8*    Liver Function Tests:  Lab 03/22/12 0350 03/21/12 0415 03/18/12 0509 03/17/12 0540  AST -- -- -- --  ALT -- -- -- --  ALKPHOS -- -- -- --  BILITOT -- -- -- --  PROT -- -- -- --  ALBUMIN 2.6* 2.6* 2.9* 2.8*   No results found for this basename: LIPASE:5,AMYLASE:5 in the last 168 hours No results found for this basename: AMMONIA:3 in the last 168 hours  CBC:  Lab 03/22/12 0350 03/21/12 0415 03/20/12 1659 03/20/12 1658 03/20/12 0417 03/19/12 2045  WBC 6.5 10.5 10.6* -- 9.2 11.4*  NEUTROABS -- -- -- -- -- --  HGB 9.0* 8.8* 9.3* 8.5* 8.6* --  HCT 27.0* 27.3* 28.1* 25.0* 26.0* --  MCV 89.1 91.9 91.2 -- 90.0 88.7  PLT 141* 160 158 -- 136* 136*    Cardiac Enzymes: No results found for this basename: CKTOTAL:5,CKMB:5,CKMBINDEX:5,TROPONINI:5 in the last 168 hours  BNP: No components found with this basename: POCBNP:5  CBG:  Lab 03/23/12 0551 03/22/12 2049 03/22/12 1610 03/22/12 1141 03/22/12 0717  GLUCAP 106* 113* 117* 122* 110*  Microbiology: Results for orders placed during the hospital encounter of 03/16/12  MRSA PCR SCREENING     Status: Normal   Collection Time   03/18/12  5:07 AM      Component Value Range Status Comment   MRSA by PCR NEGATIVE  NEGATIVE  Final     Coagulation Studies: No results found for this basename: LABPROT:5,INR:5 in the last 72 hours  Urinalysis: No results found for this basename: COLORURINE:2,APPERANCEUR:2,LABSPEC:2,PHURINE:2,GLUCOSEU:2,HGBUR:2,BILIRUBINUR:2,KETONESUR:2,PROTEINUR:2,UROBILINOGEN:2,NITRITE:2,LEUKOCYTESUR:2 in the last 72 hours    Imaging: Dg Abd Acute W/chest  03/22/2012  *RADIOLOGY REPORT*  Clinical Data: Evaluate for ileus, history of bypass surgery 2 days ago now with decreased bowel sounds  ACUTE ABDOMEN  SERIES (ABDOMEN 2 VIEW & CHEST 1 VIEW)  Comparison: Chest radiograph and 03/21/2012; 03/20/2012  Findings:  Grossly unchanged enlarged cardiac silhouette and mediastinal contours post median sternotomy and CABG.  Stable position of support apparatus.  No pneumothorax.  Grossly unchanged bilateral midlung linear heterogeneous opacities favored to represent subsegmental atelectasis.  Unchanged small bilateral effusions, right greater than left.  Nonobstructive bowel gas pattern.  Likely post mid/lower anterior abdominal wall hernia repair.  No pneumoperitoneum, pneumatosis or portal venous gas.  No acute osseous abnormalities.  IMPRESSION: 1.  Stable positioning of support apparatus.  No pneumothorax. 2.  Grossly unchanged decreased lung volumes with small bilateral effusions and bibasilar opacities, atelectasis versus infiltrate.  2.  Nonobstructive bowel gas pattern.  Original Report Authenticated By: Waynard Reeds, M.D.     Medications:      . DISCONTD: amiodarone (NEXTERONE PREMIX) 360 mg/200 mL dextrose 0.5 mg/min (03/22/12 0600)      . acetaminophen  1,000 mg Oral Q6H  . amiodarone  200 mg Oral BID PC  . aspirin EC  81 mg Oral Daily  . atorvastatin  20 mg Oral q1800  . bisacodyl  10 mg Oral Daily   Or  . bisacodyl  10 mg Rectal Daily  . cloNIDine  0.2 mg Transdermal Weekly  . clopidogrel  75 mg Oral Daily  . darbepoetin (ARANESP) injection - DIALYSIS  40 mcg Subcutaneous Q Fri-HD  . docusate sodium  200 mg Oral Daily  . furosemide  80 mg Intravenous TID  . hydrALAZINE  25 mg Oral Q6H  . insulin aspart  0-24 Units Subcutaneous TID AC & HS  . metoprolol tartrate  50 mg Oral QID  . pantoprazole  40 mg Oral Q1200  . sodium chloride  3 mL Intravenous Q12H  . DISCONTD: aspirin EC  325 mg Oral Daily  . DISCONTD: simvastatin  40 mg Oral q1800   sodium chloride, metoprolol, morphine injection, ondansetron (ZOFRAN) IV, oxyCODONE, sodium chloride  Assessment/ Plan:  1. CKD stage 5- will  follow renal function post CABG. No sig change from baseline however we are only 24 hours out. No uremic symptoms but will need to monitor closely as it is likely he will require RRT prior to LRD kidney transplant. Will follow 2. S/P CABG 2 vessel stable 3. Vascular access- left AVF  Dr. Myra Gianotti following. 4. HTN- Fairly stable 5. Dyslipidemia- Statins 6. Secondary HPTH- Corrected Calcium generous hold vitamin D 7. Edema - was given 80mg  lasix IV TID - Diuresing well Could transition to PO Lasix today   LOS: 7 [name] W @TODAY @7 :25 AM

## 2012-03-23 NOTE — Progress Notes (Signed)
CARDIAC REHAB PHASE I   PRE:  Rate/Rhythm: 58 SB with PACS    BP: sitting 111/68    SaO2: 95 1L, 94 RA  MODE:  Ambulation: 300 ft   POST:  Rate/Rhythm: 74 SR    BP: sitting 115/72     SaO2: 95 RA   Pt stiff, esp legs due to swelling. O/w tolerated well. D/c'd O2. Used RW, assist x1. Return to recliner, encouraged IS and more walking. [id number]  Harriet Masson CES, ACSM

## 2012-03-23 NOTE — Progress Notes (Signed)
UR Completed.  Brodie Scovell Jane [phone] 03/23/2012  

## 2012-03-23 NOTE — Progress Notes (Signed)
  VASCULAR AND VEIN SURGERY PROGRESS NOTE   HPI: [name] is a 51 y.o. male who has left upper extremity Hemodialysis access. The patient is 4 days post -op CABG  PE:  There continue to be a good thrill and good bruit in the left radiocephalic AVF. USG with one branch in mid forearm  Dr. Myra Gianotti evaluated and feels fistula is usable if needed.   Shawnmichael Parenteau J 03/23/2012 8:06 AM

## 2012-03-23 NOTE — Progress Notes (Signed)
Assessed patient's ambulation for another walk. Patient tired from bathroom events earlier during the day. Patient has ambulated to the bathroom. Will continue to monitor.

## 2012-03-23 NOTE — Progress Notes (Signed)
Pacing wires and CT sutures removed without difficulty.  Pt tolerated procedure with no complaints or complications.  Advised of bedrest for one hour May get up at 1640.  Bed in low position, call bell in reach.  Will continue to monitor

## 2012-03-24 DIAGNOSIS — Z0279 Encounter for issue of other medical certificate: Secondary | ICD-10-CM

## 2012-03-24 LAB — RENAL FUNCTION PANEL
Albumin: 2.5 g/dL — ABNORMAL LOW (ref 3.5–5.2)
BUN: 88 mg/dL — ABNORMAL HIGH (ref 6–23)
Chloride: 97 mEq/L (ref 96–112)
Creatinine, Ser: 7.92 mg/dL — ABNORMAL HIGH (ref 0.50–1.35)
GFR calc non Af Amer: 7 mL/min — ABNORMAL LOW (ref >90)
Phosphorus: 6.9 mg/dL — ABNORMAL HIGH (ref 2.3–4.6)
Potassium: 3.4 mEq/L — ABNORMAL LOW (ref 3.5–5.1)

## 2012-03-24 MED ORDER — SODIUM CHLORIDE 0.9 % IJ SOLN
10.0000 mL | INTRAMUSCULAR | Status: DC | PRN
  Administered 2012-03-24 (×3): 10 mL

## 2012-03-24 NOTE — Progress Notes (Signed)
CARDIAC REHAB PHASE I   PRE:  Rate/Rhythm: 59 SB  BP:  Supine:   Sitting: 132/85  Standing:    SaO2: 98 RA  MODE:  Ambulation: 350 ft   POST:  Rate/Rhythem: 68 SR  BP:  Supine:   Sitting: 139/81  Standing:    SaO2: 96 RA 1010-1040 Assisted X 1 and used walker to ambulate. Gait steady with walker, slow pace. Able to walk 350 feet. VS stable. Back to recliner after walk with call light in reach. Pt's only c/o is of swelling in legs  makes  them painful with walking. Discussed walker for home use, wife states they do not need one.  Beatrix Fetters

## 2012-03-24 NOTE — Progress Notes (Addendum)
5 Days Post-Op Procedure(s) (LRB): CORONARY ARTERY BYPASS GRAFTING (CABG) (N/A) Subjective:  [name] has no complaints this morning.  He was able to get some sleep last night.    Objective: Vital signs in last 24 hours: Temp:  [97.4 F (36.3 C)-98.4 F (36.9 C)] 97.7 F (36.5 C) (04/23 0630) Pulse Rate:  [60-63] 60  (04/23 0630) Cardiac Rhythm:  [-] Normal sinus rhythm (04/22 2000) Resp:  [18] 18  (04/23 0630) BP: (114-149)/(72-84) 149/84 mmHg (04/23 0630) SpO2:  [95 %-97 %] 96 % (04/23 0630) Weight:  [191 lb 5.8 oz (86.8 kg)] 191 lb 5.8 oz (86.8 kg) (04/23 0630)    Intake/Output from previous day: 04/22 0701 - 04/23 0700 In: 1020 [P.O.:960; I.V.:60] Out: 4275 [Urine:4275]    General appearance: alert, cooperative and no distress Heart: regular rate and rhythm Lungs: clear to auscultation bilaterally Abdomen: soft, non-tender; bowel sounds normal; no masses,  no organomegaly Extremities: edema 1+ Wound: clean and dry  Lab Results:  [facility] 03/23/12 0615 03/22/12 0350  WBC 6.0 6.5  HGB 8.6* 9.0*  HCT 25.9* 27.0*  PLT 148* 141*   BMET:  Basename 03/23/12 0615 03/22/12 0350  NA 137 137  K 3.6 4.3  CL 98 99  CO2 25 24  GLUCOSE 92 104*  BUN 79* 70*  CREATININE 8.06* 7.72*  CALCIUM 9.0 9.3    PT/INR: No results found for this basename: LABPROT,INR in the last 72 hours ABG    Component Value Date/Time   PHART 7.333* 03/19/2012 2201   HCO3 21.0 03/19/2012 2201   TCO2 22 03/20/2012 1658   ACIDBASEDEF 4.0* 03/19/2012 2201   O2SAT 93.0 03/19/2012 2201   CBG (last 3)   Basename 03/23/12 0551 03/22/12 2049 03/22/12 1610  GLUCAP 106* 113* 117*    Assessment/Plan: S/P Procedure(s) (LRB): CORONARY ARTERY BYPASS GRAFTING (CABG) (N/A)  2. CV-post op A. Fib, currently sustaining NSR, will continue Amiodarone and Metoprolol 3. Resp- wean oxygen as tolerated, aggressive IS 4. CKD- nephrology following 5. Fluid Balance- patient is edematous, will continue Lasix per  Nephrology dosing recommendations 6. Dispo- patient is doing well, he is medically stable for discharge from our standpoint. However, will defer to Nephrology and address discharge when they feel medically appropriately   LOS: 8 days    BARRETT, ERIN 03/24/2012   I have seen and examined the patient and agree with the assessment and plan as outlined.  HR continues to trend down.  Will continue home dose of Metoprolol and d/c amiodarone.  Ready for d/c from cardiac surgical standpoint.  Plan per Nephrology team.   [name] 03/24/2012 9:18 AM

## 2012-03-25 LAB — RENAL FUNCTION PANEL
Albumin: 2.6 g/dL — ABNORMAL LOW (ref 3.5–5.2)
BUN: 96 mg/dL — ABNORMAL HIGH (ref 6–23)
Chloride: 94 mEq/L — ABNORMAL LOW (ref 96–112)
Creatinine, Ser: 8.06 mg/dL — ABNORMAL HIGH (ref 0.50–1.35)
GFR calc non Af Amer: 7 mL/min — ABNORMAL LOW (ref >90)
Phosphorus: 6.1 mg/dL — ABNORMAL HIGH (ref 2.3–4.6)
Potassium: 3.3 mEq/L — ABNORMAL LOW (ref 3.5–5.1)

## 2012-03-25 MED ORDER — ACETAMINOPHEN 325 MG PO TABS: 650.0000 mg | ORAL_TABLET | Freq: Four times a day (QID) | ORAL | Status: DC | PRN

## 2012-03-25 MED ORDER — ASPIRIN 81 MG PO TBEC: 81.0000 mg | DELAYED_RELEASE_TABLET | Freq: Every day | ORAL | Status: DC

## 2012-03-25 MED ORDER — HYDRALAZINE HCL 25 MG PO TABS: 25.0000 mg | ORAL_TABLET | Freq: Four times a day (QID) | ORAL | Status: DC

## 2012-03-25 MED ORDER — CLONIDINE HCL 0.2 MG/24HR TD PTWK: 1.0000 | MEDICATED_PATCH | TRANSDERMAL | Status: DC

## 2012-03-25 MED ORDER — DARBEPOETIN ALFA-POLYSORBATE 100 MCG/0.5ML IJ SOLN
100.0000 ug | Freq: Once | INTRAMUSCULAR | Status: AC
  Administered 2012-03-25: 100 ug via SUBCUTANEOUS
  Filled 2012-03-25 (×2): qty 0.5

## 2012-03-25 MED ORDER — FUROSEMIDE 80 MG PO TABS
80.0000 mg | ORAL_TABLET | Freq: Two times a day (BID) | ORAL | Status: DC
  Filled 2012-03-25 (×2): qty 1

## 2012-03-25 MED ORDER — ACETAMINOPHEN 325 MG PO TABS
650.0000 mg | ORAL_TABLET | Freq: Four times a day (QID) | ORAL | Status: DC | PRN
  Administered 2012-03-25: 650 mg via ORAL
  Filled 2012-03-25: qty 2

## 2012-03-25 MED ORDER — ONDANSETRON HCL 4 MG PO TABS: 4.0000 mg | ORAL_TABLET | Freq: Three times a day (TID) | ORAL | Status: AC | PRN

## 2012-03-25 MED ORDER — AMIODARONE HCL 200 MG PO TABS: 200.0000 mg | ORAL_TABLET | Freq: Two times a day (BID) | ORAL | Status: DC

## 2012-03-25 MED ORDER — FUROSEMIDE 80 MG PO TABS: 80.0000 mg | ORAL_TABLET | Freq: Two times a day (BID) | ORAL | Status: DC

## 2012-03-25 MED ORDER — METOPROLOL TARTRATE 50 MG PO TABS: 50.0000 mg | ORAL_TABLET | Freq: Four times a day (QID) | ORAL | Status: DC

## 2012-03-25 NOTE — Progress Notes (Addendum)
6 Days Post-Op Procedure(s) (LRB): CORONARY ARTERY BYPASS GRAFTING (CABG) (N/A) Subjective:  Mr. [name] has no new complaints this morning.  He was evaluated by Nephrology who feels he is ready for discharge from their standpoint.  Objective: Vital signs in last 24 hours: Temp:  [97.2 F (36.2 C)-98.2 F (36.8 C)] 98.2 F (36.8 C) (04/24 0455) Pulse Rate:  [54-76] 76  (04/24 0455) Cardiac Rhythm:  [-] Normal sinus rhythm (04/23 2000) Resp:  [16-18] 18  (04/24 0455) BP: (115-139)/(65-85) 139/83 mmHg (04/24 0455) SpO2:  [95 %-99 %] 96 % (04/24 0455) Weight:  [195 lb 1.7 oz (88.5 kg)] 195 lb 1.7 oz (88.5 kg) (04/24 0455)  Intake/Output from previous day: 04/23 0701 - 04/24 0700 In: 240 [P.O.:240] Out: 2300 [Urine:2300]  General appearance: alert, cooperative and no distress Neurologic: intact Heart: regular rate and rhythm Lungs: clear to auscultation bilaterally Abdomen: soft, non-tender; bowel sounds normal; no masses,  no organomegaly Extremities: edema minimal in left leg, right leg 1+ Wound: clean and dry  Lab Results:  [facility] 03/23/12 0615  WBC 6.0  HGB 8.6*  HCT 25.9*  PLT 148*   BMET:  Basename 03/25/12 0612 03/24/12 1145  NA 136 139  K 3.3* 3.4*  CL 94* 97  CO2 26 24  GLUCOSE 147* 89  BUN 96* 88*  CREATININE 8.06* 7.92*  CALCIUM 9.5 9.3    PT/INR: No results found for this basename: LABPROT,INR in the last 72 hours ABG    Component Value Date/Time   PHART 7.333* 03/19/2012 2201   HCO3 21.0 03/19/2012 2201   TCO2 22 03/20/2012 1658   ACIDBASEDEF 4.0* 03/19/2012 2201   O2SAT 93.0 03/19/2012 2201   CBG (last 3)   Basename 03/23/12 0551 03/22/12 2049 03/22/12 1610  GLUCAP 106* 113* 117*    Assessment/Plan: S/P Procedure(s) (LRB): CORONARY ARTERY BYPASS GRAFTING (CABG) (N/A)  2. CV- post op A. Fib, sustaining NSR, will continue metoprolol, amiodarone was d/c yesterday 3. HTN- will continue Hydralazine 4. CKD- Nephrology following, recommended Lasix  80mg  BID at d/c to follow up with them in 1 week 5. Fluid Balance- patient remains edematous will continue Lasix  6. Dispo- patient is medically stable, he will be discharged home today.  He is to follow up with Nephrologist in one week    LOS: 9 days    BARRETT, ERIN 03/25/2012   I have seen and examined the patient and agree with the assessment and plan as outlined.  Dondi Burandt H 03/25/2012 8:58 AM

## 2012-03-25 NOTE — Discharge Summary (Signed)
Physician Discharge Summary  Patient ID: [name] MRN: [id number] DOB/AGE: July 10, 1961 51 y.o.  Admit date: 03/16/2012 Discharge date: 03/25/2012  Admission Diagnoses:  Patient Active Problem List  Diagnoses  . Abnormal stress test  . Murmur  . HTN (hypertension)  . Smoking  . CRF (chronic renal failure)   Discharge Diagnoses:   Patient Active Problem List  Diagnoses  . Abnormal stress test  . Murmur  . HTN (hypertension)  . Smoking  . CRF (chronic renal failure)  . S/P CABG x 5    Discharged Condition: good  History of Present Illness:   [name] is a 51 yo white male with known history of HTN and CRF.  He presented to Dr. Eden Emms for evaluation of cardiac clearnace prior to undergoing kidney transplantation.  He was evaluated on 01/15/2012 at which time his previous test results were reviewed.  The patient had undergo an ECHO on 12/12/11 which revealed Moderate Asymmetric LVH and a preserved EF, severe apical hypertrophy with mild MR and trace TR.  The patient underwent Myoview on 1/29 which showed mild to moderate "photopenia" in mid cavity to apical regions of anteroseptum, inferoseptum, and inferior walls.  When questioned the patient admits to smoking 4-5 cig/day.  He denies chest pain and dyspnea, but did admit to occasional palpitations.  Dr. Eden Emms spoke with the patient in depth regarding evaluations for surgery.  The patient agreed it would be beneficial to undergo further study to clarify current situation.  The patient was set up for a Dobutamine stress ECHO which was performed on 01/27/2012 which was normal except for chronic EKG changes.  Due to these findings it was felt the patient should proceed with Cardiac catheterization.  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  Due to these findings the patient was referred to Cardiothoracic surgery for evaluation for bypass  procedure.  Dr. Cornelius Moras evaluated the patient and felt that for long term benefit the patient would be a candidate for bypass surgery.  The risks and benefits were addressed with the patient and his wife and they were agreeable to proceed.  Hospital Course:   On 03/19/2012 the patient was taken to the OR and underwent CABG x5 utilizing LIMA to LAD, Free RIMA to OM1, Sequential SVG to Acute Marginal and LPLB2, and SVG to Diagonal 1.  The patient also underwent Endoscopic Saphenous Vein Harvest of Right leg.  The patient tolerated the procedure well and was taken to the Surgical ICU in stable condition.  POD #0 the patient was extubated.  He was monitored by Nephrology.  POD #1 the patients chest tubes and lines were removed without difficulty.  Nephrology recommended an increase in the patients Lasix for hypervolemia.  The patient was having difficulty with HTN, which is chronic problem for the patient.  He was being treated with IV NTG and his metoprolol was increased.  POD #2 the patient continued to have difficulty with HTN.  His metoprolol was increased again and he was restarted on his home clonidine and hydralazine.  He was developing signs suggestive of a post op Ileus so he was placed on a clear liquid diet.  His chest xray did not reveal any evidence of pneumothorax.  POD #3 the patient developed A. Fib overnight.  He was placed on IV amiodarone with successful conversion to NSR.  He was placed on oral Amiodarone and IV was discontinued.  He was medically stable and transferred to the stepdown unit.  POD #4 the patient underwent evaluation of AV Fistula in left extremity, which vascular surgery feels is usable.  The patient was taken off all insulin since no diagnosis of diabetes.  The patient was weaned off his oxygen.  POD #5 the patient continued to sustain NSR.  His Amiodarone was discontinued.  POD #6 the patient is doing well.  Nephrology has closely monitored the patient and is agreeable he is ready  to be discharged home.  He is to follow up with Dr. Eliott Nine in one week.  He is to follow up with Dr. Cornelius Moras in 3 weeks.    Disposition: Home  Discharge Orders    Future Appointments: Provider: Department: Dept Phone: Center:   04/13/2012 3:30 PM [name], MD [facility] [phone] [facility]     Medication List  As of 03/25/2012  8:09 AM   STOP taking these medications         aspirin 325 MG tablet      fenofibrate micronized 134 MG capsule      hydrALAZINE 50 MG tablet      nisoldipine 20 MG 24 hr tablet      omega-3 acid ethyl esters 1 G capsule         TAKE these medications         acetaminophen 325 MG tablet   Commonly known as: TYLENOL   Take 2 tablets (650 mg total) by mouth every 6 (six) hours as needed (pain).      allopurinol 100 MG tablet   Commonly known as: ZYLOPRIM   Take 100 mg by mouth 2 (two) times daily.      aspirin 81 MG EC tablet   Take 1 tablet (81 mg total) by mouth daily.      calcitRIOL 0.25 MCG capsule   Commonly known as: ROCALTROL   Take 0.25 mcg by mouth every Monday, Wednesday, and Friday.      cloNIDine 0.2 MG tablet   Commonly known as: CATAPRES   Take 0.2 mg by mouth 2 (two) times daily.      cloNIDine 0.2 mg/24hr patch   Commonly known as: CATAPRES - Dosed in mg/24 hr   Place 1 patch (0.2 mg total) onto the skin once a week.      clopidogrel 75 MG tablet   Commonly known as: PLAVIX   Take 75 mg by mouth daily. Stopped on 4-10 for procedure      furosemide 80 MG tablet   Commonly known as: LASIX   Take 1 tablet (80 mg total) by mouth 2 times daily at 12 noon and 4 pm.      hydrALAZINE 25 MG tablet   Commonly known as: APRESOLINE   Take 1 tablet (25 mg total) by mouth every 6 (six) hours.      metoprolol 50 MG tablet   Commonly known as: LOPRESSOR   Take 1 tablet (50 mg total) by mouth 4 (four) times daily.      ondansetron 4 MG tablet   Commonly known as: ZOFRAN   Take 1 tablet (4 mg total) by mouth every 8 (eight)  hours as needed for nausea.      oxyCODONE 5 MG immediate release tablet   Commonly known as: Oxy IR/ROXICODONE   Take 1-2 tablets (5-10 mg total) by mouth every 4 (four) hours as needed.      simvastatin 40 MG tablet   Commonly known as:  ZOCOR   Take 40 mg by mouth daily.      Vitamin D (Ergocalciferol) 50000 UNITS Caps   Commonly known as: DRISDOL   Take 50,000 Units by mouth every 7 (seven) days. Take Tuesday           Follow-up Information    Follow up with [name], MD. (Monday, May 13 at 3:30 PM-appointment with Dr. Cornelius Moras. Obtain a chest x-ray and [place] imaging on May 10 , please see card.)    Contact information:   [address] [place] [address] [phone]       Follow up with [name], MD. (2 weeks -please contact the office to arrange)    Contact information:   [address] [place] [address] [phone]          Signed: Lowella Dandy 03/25/2012, 8:09 AM

## 2012-03-25 NOTE — Progress Notes (Signed)
Ed completed with wife present. Motivated to quit smoking. Agreeable to [facility] [place] although sts he might do it. [phone] Ethelda Chick CES, ACSM

## 2012-03-25 NOTE — Progress Notes (Signed)
Pt d/c home with instructions, r/x, and f/u appointments.  Pt and wife verbalized understanding of instructions, pt home with wife and family. Escorted out by Agricultural consultant.

## 2012-03-25 NOTE — Progress Notes (Signed)
[facility] ROUNDING NOTE   Subjective:   Interval History: has complaints incisional pain.  Objective:  Vital signs in last 24 hours:  Temp:  [97.2 F (36.2 C)-98.2 F (36.8 C)] 98.2 F (36.8 C) (04/24 0455) Pulse Rate:  [54-76] 76  (04/24 0455) Resp:  [16-18] 18  (04/24 0455) BP: (115-149)/(65-85) 139/83 mmHg (04/24 0455) SpO2:  [95 %-99 %] 96 % (04/24 0455) Weight:  [86.8 kg (191 lb 5.8 oz)] 86.8 kg (191 lb 5.8 oz) (04/23 0630)  Weight change:  Filed Weights   03/22/12 0600 03/23/12 0449 03/24/12 0630  Weight: 92.4 kg (203 lb 11.3 oz) 89.767 kg (197 lb 14.4 oz) 86.8 kg (191 lb 5.8 oz)    Intake/Output: I/O last 3 completed shifts: In: 1020 [P.O.:960; I.V.:60] Out: 5075 [Urine:5075]   Intake/Output this shift:  Total I/O In: 240 [P.O.:240] Out: 1500 [Urine:1500]  General appearance: alert, cooperative and no distress  Heart: regular rate and rhythm  Lungs: clear to auscultation bilaterally  Abdomen: soft, non-tender; bowel sounds normal; no masses, no organomegaly  Extremities: edema 1+  Wound: clean and dry no drainage healing well    Basic Metabolic Panel:  Lab 03/24/12 1610 03/23/12 0615 03/22/12 0350 03/21/12 0415 03/20/12 1659 03/20/12 1658 03/20/12 0417 03/19/12 2045  NA 139 137 137 139 -- 141 -- --  K 3.4* 3.6 4.3 5.0 -- 4.9 -- --  CL 97 98 99 104 -- 112 -- --  CO2 24 25 24 24  -- -- 22 --  GLUCOSE 89 92 104* 126* -- 130* -- --  BUN 88* 79* 70* 68* -- 65* -- --  CREATININE 7.92* 8.06* 7.72* 7.38* 7.06* -- -- --  CALCIUM 9.3 9.0 9.3 -- -- -- -- --  MG -- -- -- -- 2.4 -- 2.3 2.3  PHOS 6.9* 7.4* 8.5* 8.1* -- -- -- --    Liver Function Tests:  Lab 03/24/12 1145 03/23/12 0615 03/22/12 0350 03/21/12 0415  AST -- -- -- --  ALT -- -- -- --  ALKPHOS -- -- -- --  BILITOT -- -- -- --  PROT -- -- -- --  ALBUMIN 2.5* 2.4* 2.6* 2.6*   No results found for this basename: LIPASE:5,AMYLASE:5 in the last 168 hours No results found for this  basename: AMMONIA:3 in the last 168 hours  CBC:  Lab 03/23/12 0615 03/22/12 0350 03/21/12 0415 03/20/12 1659 03/20/12 1658 03/20/12 0417  WBC 6.0 6.5 10.5 10.6* -- 9.2  NEUTROABS -- -- -- -- -- --  HGB 8.6* 9.0* 8.8* 9.3* 8.5* --  HCT 25.9* 27.0* 27.3* 28.1* 25.0* --  MCV 89.0 89.1 91.9 91.2 -- 90.0  PLT 148* 141* 160 158 -- 136*    Cardiac Enzymes: No results found for this basename: CKTOTAL:5,CKMB:5,CKMBINDEX:5,TROPONINI:5 in the last 168 hours  BNP: No components found with this basename: POCBNP:5  CBG:  Lab 03/23/12 0551 03/22/12 2049 03/22/12 1610 03/22/12 1141 03/22/12 0717  GLUCAP 106* 113* 117* 122* 110*    Microbiology: Results for orders placed during the hospital encounter of 03/16/12  MRSA PCR SCREENING     Status: Normal   Collection Time   03/18/12  5:07 AM      Component Value Range Status Comment   MRSA by PCR NEGATIVE  NEGATIVE  Final     Coagulation Studies: No results found for this basename: LABPROT:5,INR:5 in the last 72 hours  Urinalysis: No results found for this basename: COLORURINE:2,APPERANCEUR:2,LABSPEC:2,PHURINE:2,GLUCOSEU:2,HGBUR:2,BILIRUBINUR:2,KETONESUR:2,PROTEINUR:2,UROBILINOGEN:2,NITRITE:2,LEUKOCYTESUR:2 in the last 72 hours    Imaging: No  results found.   Medications:        . aspirin EC  81 mg Oral Daily  . atorvastatin  20 mg Oral q1800  . bisacodyl  10 mg Oral Daily   Or  . bisacodyl  10 mg Rectal Daily  . cloNIDine  0.2 mg Transdermal Weekly  . clopidogrel  75 mg Oral Daily  . darbepoetin (ARANESP) injection - DIALYSIS  100 mcg Subcutaneous Once  . docusate sodium  200 mg Oral Daily  . furosemide  80 mg Oral q12n4p  . hydrALAZINE  25 mg Oral Q6H  . metoprolol tartrate  50 mg Oral QID  . pantoprazole  40 mg Oral Q1200  . sodium chloride  3 mL Intravenous Q12H  . DISCONTD: amiodarone  200 mg Oral BID PC  . DISCONTD: darbepoetin (ARANESP) injection - DIALYSIS  40 mcg Subcutaneous Q Fri-HD  . DISCONTD: furosemide  80  mg Oral Q8H   sodium chloride, acetaminophen, ondansetron (ZOFRAN) IV, oxyCODONE, sodium chloride, sodium chloride, DISCONTD: metoprolol, DISCONTD:  morphine injection  Assessment/ Plan:  1. CKD stage 5- will follow renal function post CABG. No sig change from baseline. Discussed with Dr Eliott Nine and will have patient follow up with her in a week. I also explained that dialysis may be needed as we wait for healing after CABG. Despite advanced renal failure his creatinine is close to where it has always been. 2. S/P CABG 2 vessel stable 3. Vascular access- left AVF Dr. Myra Gianotti following. 4. HTN- Fairly stable 5. Dyslipidemia- Statins 6. Secondary HPTH- Corrected Calcium generous hold vitamin D 7. Edema will continue 80mg  BID mobilizing edema well 8. Anemia will give a dose of Aranesp today 9. Incisional pain Tylenol PRN ( states oxycodone too strong) 10. Patient is ready for discharge from my perspective. I will arrange follow up with Dr Eliott Nine in 1 week     LOS: 9 Analyce Tavares W @TODAY @6 :27 AM

## 2012-03-25 NOTE — Progress Notes (Signed)
PT IJ removed per MD order and per facility policy by charge nurse. Pt tolerated well and no complaints. Catheter intact. Dressing applied. Instructed to lay down for at least 30 minutes and to leave dressing on for 24 hours. MD okay with pt not having peripheral IV site. Will continue to monitor

## 2012-03-25 NOTE — Discharge Summary (Signed)
I agree with the above discharge summary and plan for follow-up.  [name] H  

## 2012-03-31 ENCOUNTER — Encounter: Payer: Self-pay | Admitting: Thoracic Surgery (Cardiothoracic Vascular Surgery)

## 2012-04-09 ENCOUNTER — Other Ambulatory Visit: Payer: Self-pay | Admitting: Thoracic Surgery (Cardiothoracic Vascular Surgery)

## 2012-04-09 DIAGNOSIS — I2510 Atherosclerotic heart disease of native coronary artery without angina pectoris: Secondary | ICD-10-CM

## 2012-04-10 ENCOUNTER — Ambulatory Visit (HOSPITAL_COMMUNITY)
Admission: RE | Admit: 2012-04-10 | Discharge: 2012-04-10 | Disposition: A | Discharge status: Home / Self Care | Payer: 59 | Source: Ambulatory Visit | Attending: Thoracic Surgery (Cardiothoracic Vascular Surgery) | Admitting: Thoracic Surgery (Cardiothoracic Vascular Surgery)

## 2012-04-10 DIAGNOSIS — I2510 Atherosclerotic heart disease of native coronary artery without angina pectoris: Secondary | ICD-10-CM

## 2012-04-10 DIAGNOSIS — Z09 Encounter for follow-up examination after completed treatment for conditions other than malignant neoplasm: Secondary | ICD-10-CM | POA: Insufficient documentation

## 2012-04-10 DIAGNOSIS — Z951 Presence of aortocoronary bypass graft: Secondary | ICD-10-CM | POA: Insufficient documentation

## 2012-04-10 DIAGNOSIS — J90 Pleural effusion, not elsewhere classified: Secondary | ICD-10-CM | POA: Insufficient documentation

## 2012-04-13 ENCOUNTER — Encounter: Payer: Self-pay | Admitting: Thoracic Surgery (Cardiothoracic Vascular Surgery)

## 2012-04-13 ENCOUNTER — Inpatient Hospital Stay (HOSPITAL_COMMUNITY)
Admission: AD | Admit: 2012-04-13 | Discharge: 2012-04-16 | DRG: 602 | Disposition: A | Discharge status: Home / Self Care | Payer: 59 | Source: Ambulatory Visit | Attending: Thoracic Surgery (Cardiothoracic Vascular Surgery) | Admitting: Thoracic Surgery (Cardiothoracic Vascular Surgery)

## 2012-04-13 ENCOUNTER — Encounter (HOSPITAL_COMMUNITY): Payer: Self-pay | Admitting: Thoracic Surgery (Cardiothoracic Vascular Surgery)

## 2012-04-13 ENCOUNTER — Ambulatory Visit (INDEPENDENT_AMBULATORY_CARE_PROVIDER_SITE_OTHER): Payer: Self-pay | Admitting: Thoracic Surgery (Cardiothoracic Vascular Surgery)

## 2012-04-13 VITALS — BP 110/66 | HR 66 | Resp 18 | Ht 68.0 in | Wt 180.0 lb

## 2012-04-13 DIAGNOSIS — F17200 Nicotine dependence, unspecified, uncomplicated: Secondary | ICD-10-CM | POA: Diagnosis present

## 2012-04-13 DIAGNOSIS — I120 Hypertensive chronic kidney disease with stage 5 chronic kidney disease or end stage renal disease: Secondary | ICD-10-CM | POA: Diagnosis present

## 2012-04-13 DIAGNOSIS — Z7902 Long term (current) use of antithrombotics/antiplatelets: Secondary | ICD-10-CM

## 2012-04-13 DIAGNOSIS — I2510 Atherosclerotic heart disease of native coronary artery without angina pectoris: Secondary | ICD-10-CM

## 2012-04-13 DIAGNOSIS — Z951 Presence of aortocoronary bypass graft: Secondary | ICD-10-CM

## 2012-04-13 DIAGNOSIS — L02419 Cutaneous abscess of limb, unspecified: Principal | ICD-10-CM | POA: Diagnosis present

## 2012-04-13 DIAGNOSIS — Z8673 Personal history of transient ischemic attack (TIA), and cerebral infarction without residual deficits: Secondary | ICD-10-CM

## 2012-04-13 DIAGNOSIS — Z880 Allergy status to penicillin: Secondary | ICD-10-CM

## 2012-04-13 DIAGNOSIS — IMO0002 Reserved for concepts with insufficient information to code with codable children: Secondary | ICD-10-CM | POA: Diagnosis present

## 2012-04-13 DIAGNOSIS — M109 Gout, unspecified: Secondary | ICD-10-CM | POA: Diagnosis present

## 2012-04-13 DIAGNOSIS — Z79899 Other long term (current) drug therapy: Secondary | ICD-10-CM

## 2012-04-13 DIAGNOSIS — N186 End stage renal disease: Secondary | ICD-10-CM | POA: Diagnosis present

## 2012-04-13 DIAGNOSIS — L0390 Cellulitis, unspecified: Secondary | ICD-10-CM | POA: Diagnosis present

## 2012-04-13 DIAGNOSIS — E7800 Pure hypercholesterolemia, unspecified: Secondary | ICD-10-CM | POA: Diagnosis present

## 2012-04-13 HISTORY — DX: Cellulitis, unspecified: L03.90

## 2012-04-13 LAB — URINALYSIS, ROUTINE W REFLEX MICROSCOPIC
Glucose, UA: NEGATIVE mg/dL
Leukocytes, UA: NEGATIVE
Protein, ur: 100 mg/dL — AB
Specific Gravity, Urine: 1.011 (ref 1.005–1.030)
Urobilinogen, UA: 0.2 mg/dL (ref 0.0–1.0)

## 2012-04-13 LAB — CBC
HCT: 37.1 % — ABNORMAL LOW (ref 39.0–52.0)
Hemoglobin: 11.9 g/dL — ABNORMAL LOW (ref 13.0–17.0)
MCV: 87.9 fL (ref 78.0–100.0)
RBC: 4.22 MIL/uL (ref 4.22–5.81)
RDW: 14.9 % (ref 11.5–15.5)
WBC: 5.7 10*3/uL (ref 4.0–10.5)

## 2012-04-13 LAB — RENAL FUNCTION PANEL
Albumin: 3.4 g/dL — ABNORMAL LOW (ref 3.5–5.2)
BUN: 93 mg/dL — ABNORMAL HIGH (ref 6–23)
CO2: 27 mEq/L (ref 19–32)
Chloride: 98 mEq/L (ref 96–112)
Potassium: 3.5 mEq/L (ref 3.5–5.1)

## 2012-04-13 LAB — URINE MICROSCOPIC-ADD ON

## 2012-04-13 MED ORDER — VANCOMYCIN HCL 1000 MG IV SOLR
2000.0000 mg | Freq: Once | INTRAVENOUS | Status: AC
  Administered 2012-04-13: 2000 mg via INTRAVENOUS
  Filled 2012-04-13: qty 2000

## 2012-04-13 MED ORDER — HYDRALAZINE HCL 50 MG PO TABS
50.0000 mg | ORAL_TABLET | ORAL | Status: DC
  Administered 2012-04-14 – 2012-04-16 (×3): 50 mg via ORAL
  Filled 2012-04-13 (×3): qty 1

## 2012-04-13 MED ORDER — ACETAMINOPHEN 325 MG PO TABS
650.0000 mg | ORAL_TABLET | Freq: Four times a day (QID) | ORAL | Status: DC | PRN
  Administered 2012-04-13 – 2012-04-16 (×7): 650 mg via ORAL
  Filled 2012-04-13 (×8): qty 2

## 2012-04-13 MED ORDER — ENOXAPARIN SODIUM 30 MG/0.3ML ~~LOC~~ SOLN
30.0000 mg | SUBCUTANEOUS | Status: DC
  Administered 2012-04-13 – 2012-04-15 (×3): 30 mg via SUBCUTANEOUS
  Filled 2012-04-13 (×4): qty 0.3

## 2012-04-13 MED ORDER — VITAMIN D (ERGOCALCIFEROL) 1.25 MG (50000 UNIT) PO CAPS
50000.0000 [IU] | ORAL_CAPSULE | ORAL | Status: DC
  Administered 2012-04-14: 50000 [IU] via ORAL
  Filled 2012-04-13: qty 1

## 2012-04-13 MED ORDER — FUROSEMIDE 10 MG/ML IJ SOLN
80.0000 mg | Freq: Two times a day (BID) | INTRAMUSCULAR | Status: DC
  Administered 2012-04-13 – 2012-04-16 (×6): 80 mg via INTRAVENOUS
  Filled 2012-04-13 (×8): qty 8

## 2012-04-13 MED ORDER — CALCITRIOL 0.25 MCG PO CAPS
0.2500 ug | ORAL_CAPSULE | ORAL | Status: DC
  Administered 2012-04-15: 0.25 ug via ORAL
  Filled 2012-04-13: qty 1

## 2012-04-13 MED ORDER — METOPROLOL TARTRATE 50 MG PO TABS
50.0000 mg | ORAL_TABLET | Freq: Four times a day (QID) | ORAL | Status: DC
  Administered 2012-04-13 – 2012-04-14 (×3): 50 mg via ORAL
  Filled 2012-04-13 (×5): qty 1

## 2012-04-13 MED ORDER — ASPIRIN EC 325 MG PO TBEC
325.0000 mg | DELAYED_RELEASE_TABLET | Freq: Every day | ORAL | Status: DC
  Administered 2012-04-14 – 2012-04-16 (×3): 325 mg via ORAL
  Filled 2012-04-13 (×3): qty 1

## 2012-04-13 MED ORDER — CLONIDINE HCL 0.2 MG PO TABS
0.2000 mg | ORAL_TABLET | Freq: Every day | ORAL | Status: DC
  Filled 2012-04-13: qty 1

## 2012-04-13 MED ORDER — BISACODYL 10 MG RE SUPP: 10.0000 mg | Freq: Every day | RECTAL | Status: DC | PRN

## 2012-04-13 MED ORDER — ASPIRIN 81 MG PO TBEC: 325.0000 mg | DELAYED_RELEASE_TABLET | Freq: Every day | ORAL | Status: DC

## 2012-04-13 MED ORDER — ACETAMINOPHEN 325 MG PO TABS: 650.0000 mg | ORAL_TABLET | Freq: Four times a day (QID) | ORAL | Status: DC | PRN

## 2012-04-13 MED ORDER — OXYCODONE HCL 5 MG PO TABS: 10.0000 mg | ORAL_TABLET | ORAL | Status: DC | PRN

## 2012-04-13 MED ORDER — ALLOPURINOL 100 MG PO TABS
100.0000 mg | ORAL_TABLET | Freq: Two times a day (BID) | ORAL | Status: DC
  Administered 2012-04-13 – 2012-04-16 (×6): 100 mg via ORAL
  Filled 2012-04-13 (×7): qty 1

## 2012-04-13 MED ORDER — SIMVASTATIN 40 MG PO TABS
40.0000 mg | ORAL_TABLET | Freq: Every day | ORAL | Status: DC
  Administered 2012-04-14 – 2012-04-16 (×3): 40 mg via ORAL
  Filled 2012-04-13 (×3): qty 1

## 2012-04-13 MED ORDER — DOCUSATE SODIUM 100 MG PO CAPS
100.0000 mg | ORAL_CAPSULE | Freq: Every day | ORAL | Status: DC
  Administered 2012-04-16: 100 mg via ORAL
  Filled 2012-04-13 (×3): qty 1

## 2012-04-13 MED ORDER — CIPROFLOXACIN IN D5W 400 MG/200ML IV SOLN
400.0000 mg | INTRAVENOUS | Status: DC
  Administered 2012-04-13 – 2012-04-15 (×3): 400 mg via INTRAVENOUS
  Filled 2012-04-13 (×4): qty 200

## 2012-04-13 NOTE — Progress Notes (Signed)
ANTIBIOTIC CONSULT NOTE - INITIAL  Pharmacy Consult for vancomycin and ciprofloxacin Indication: RLE cellulitis  Allergies  Allergen Reactions  . Penicillins Itching and Rash    Patient Measurements:   Adjusted Body Weight:   Vital Signs: BP: 110/66 mmHg (05/13 1540) Pulse Rate: 66  (05/13 1540) Intake/Output from previous day:   Intake/Output from this shift:    Labs: No results found for this basename: WBC:3,HGB:3,PLT:3,LABCREA:3,CREATININE:3 in the last 72 hours The CrCl is unknown because both a height and weight (above a minimum accepted value) are required for this calculation. No results found for this basename: VANCOTROUGH:2,VANCOPEAK:2,VANCORANDOM:2,GENTTROUGH:2,GENTPEAK:2,GENTRANDOM:2,TOBRATROUGH:2,TOBRAPEAK:2,TOBRARND:2,AMIKACINPEAK:2,AMIKACINTROU:2,AMIKACIN:2, in the last 72 hours   Microbiology: Recent Results (from the past 720 hour(s))  MRSA PCR SCREENING     Status: Normal   Collection Time   03/18/12  5:07 AM      Component Value Range Status Comment   MRSA by PCR NEGATIVE  NEGATIVE  Final     Medical History: Past Medical History  Diagnosis Date  . Hypertension   . CAD (coronary artery disease)   . Elevated cholesterol   . Ventricular hypertrophy   . Gout   . Pharyngitis   . Peripheral vascular disease   . Heart murmur   . Parathyroid disorder     "they are careful w/my parathyroid cause I have kidney disease"  . Migraines     "last one 1990's"  . Stroke 1995; 2001    denies residual  . Chronic kidney disease (CKD), stage IV (severe)     "not getting dialysis yet" (03/16/12)  . 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 enoxaparin (LOVENOX) injection  30 mg Subcutaneous Q24H  . furosemide  80 mg Intravenous BID   Assessment: 50 YOM presents with Severe pain and swelling of RLE. He is s/p CABG on 03/19/12.  Infection  appears to be related to saphenous vein harvest site.  He did receive HD during previous admission for CRF (He was not on HD prior to 4/15 admission).   Goal of Therapy:  Pre-HD level 15-66mcg/ml  Plan:  1. Vancomycin 2gm IV x 1 now and f/u if HD continued as outpatient and to resume.  2. Ciprofloxacin 400mg  IV q24h  Dannielle Huh 04/13/2012,5:01 PM

## 2012-04-13 NOTE — H&P (Signed)
CARDIOTHORACIC SURGERY HISTORY AND PHYSICAL EXAM  PCP is Mickie Hillier, MD, MD Attending physician is @ATTENDING @ Referring Provider is [name], MD   Chief Complaint: Right leg pain  HPI:  He should is a 51 year old male with end-stage renal disease 11 coronary artery bypass grafting on 03/19/2012. He returned to the office today for routine followup complaining of severe pain and swelling involving his right lower extremity. He states that shortly after going home the swelling got worse and over the last 2 weeks the pain is gotten to the point where he cannot walk at all. He has not had any fevers or chills. He is not having shortness of breath. He has minimal soreness in his chest.  Past Medical History  Diagnosis Date  . Hypertension   . CAD (coronary artery disease)   . Elevated cholesterol   . Ventricular hypertrophy   . Gout   . Pharyngitis   . Peripheral vascular disease   . Heart murmur   . Parathyroid disorder     "they are careful w/my parathyroid cause I have kidney disease"  . Migraines     "last one 1990's"  . Stroke 1995; 2001    denies residual  . Chronic kidney disease (CKD), stage IV (severe)     "not getting dialysis yet" (03/16/12)  . 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 Cardiac catheterization 03/16/12  . Renal artery stent 2001  . Fracture surgery   . Av fistula placement 2011    left forearm  . Elbow fracture surgery 1972    left  . Hernia repair 09/2009    umbilical  . Inguinal hernia repair 09/2009    bilaterally  . Dental surgery 2008    multiple  . Coronary artery bypass graft 03/19/2012    Procedure: CORONARY ARTERY BYPASS GRAFTING (CABG);  Surgeon: [name], MD;  Location: [facility] OR;  Service: Open Heart Surgery;  Laterality: N/A;  (B) MAMMARY    No family history on  file.  Social History History  Substance Use Topics  . Smoking status: Former Smoker -- 0.3 packs/day for 30 years    Types: Cigarettes    Quit date: 03/15/2012  . Smokeless tobacco: Never Used  . Alcohol Use: Yes     03/16/12 "2 mixed drinks/month"    Current Outpatient Prescriptions  Medication Sig Dispense Refill  . acetaminophen (TYLENOL) 325 MG tablet Take 2 tablets (650 mg total) by mouth every 6 (six) hours as needed (pain).      Marland Kitchen allopurinol (ZYLOPRIM) 100 MG tablet Take 100 mg by mouth 2 (two) times daily.        Marland Kitchen aspirin 81 MG EC tablet Take 325 mg by mouth daily.      . calcitRIOL (ROCALTROL) 0.25 MCG capsule Take 0.25 mcg by mouth every Monday, Wednesday, and Friday.       . cloNIDine (CATAPRES) 0.2 MG tablet Take 0.2 mg by mouth 2 (two) times daily.      . clopidogrel (PLAVIX) 75 MG tablet Take 75 mg by mouth daily. Stopped on 4-10 for procedure      . hydrALAZINE (APRESOLINE) 25 MG tablet Take 50 mg by mouth 1 day or 1 dose.      . metoprolol (LOPRESSOR)  50 MG tablet Take 1 tablet (50 mg total) by mouth 4 (four) times daily.  120 tablet  1  . simvastatin (ZOCOR) 40 MG tablet Take 40 mg by mouth daily.      . Vitamin D, Ergocalciferol, (DRISDOL) 50000 UNITS CAPS Take 50,000 Units by mouth every 7 (seven) days. Take Tuesday       . DISCONTD: hydrALAZINE (APRESOLINE) 25 MG tablet Take 1 tablet (25 mg total) by mouth every 6 (six) hours.  120 tablet  1  . furosemide (LASIX) 80 MG tablet Take 1 tablet (80 mg total) by mouth 2 times daily at 12 noon and 4 pm.  60 tablet  1    Allergies  Allergen Reactions  . Penicillins Itching and Rash    Review of Systems:  General:  decreased appetite, decreasede energy   Respiratory:  no cough, no wheezing, no hemoptysis, no pain with inspiration or cough, no shortness of breath   Cardiac:   no chest pain or tightness, no exertional SOB, no resting SOB, no PND, no orthopnea, + severe right LE edema, no palpitations, no  syncope  GI:   no difficulty swallowing, no hematochezia, no hematemesis, no melena, no constipation, no diarrhea   GU:   no dysuria, no urgency, no frequency   Musculoskeletal: no arthritis, no arthralgia, severe pain in right lower extremity, unable to straighten it completely  Vascular:  no pain suggestive of claudication   Neuro:   no symptoms suggestive of TIA's, no seizures, no headaches, no peripheral neuropathy   Endocrine:  Negative   HEENT:  no loose teeth or painful teeth,  no recent vision changes  Psych:   no anxiety, no depression    Physical Exam:   BP 110/66  Pulse 66  Resp 18  Ht 5\' 8"  (1.727 m)  Wt 81.647 kg (180 lb)  BMI 27.37 kg/m2  SpO2 96%  General:  Well nourished male in NAD   HEENT:  Unremarkable   Neck:   no JVD, no bruits, no adenopathy   Chest:   clear to auscultation, symmetrical breath sounds, no wheezes, no rhonchi, sternum stable and healing nicely  CV:   RRR, no  murmur   Abdomen:  soft, non-tender, no masses   Extremities:  Severe edema right lower leg with blanching erythema and tenderness, fluctuance over saphenous vein tunnel below the knee, incisions intact  Rectal/GU  Deferred  Neuro:   Grossly non-focal and symmetrical throughout  Skin:   Clean and dry, no rashes, no breakdown  Diagnostic Tests:  N/a   Impression:  Cellulitis of right lower extremity related to saphenous vein harvest for coronary artery bypass surgery last month. The patient is unable to walk with severe tenderness and pain due to to this swelling and cellulitis.   Plan:  We will admit the patient for intravenous antibiotics and obtain right lower extremity duplex scan to rule out DVT. We will give Lasix intravenously for now and make sure that the nephrology team is aware that the patient has been readmitted.    [name]. Steven Moras, MD 04/13/2012 4:20 PM

## 2012-04-13 NOTE — Progress Notes (Signed)
[address]            [place] [address]          [phone]     CARDIOTHORACIC SURGERY OFFICE NOTE  Referring Provider is [name], MD PCP is Mickie Hillier, MD, MD   HPI:  Patient returns to the office today for followup status post coronary artery bypass grafting last month. He has severe pain and swelling involving his right lower extremity which has gradually gotten worse since hospital discharge. The pain and swelling is bad enough that he states that he cannot walk at all and he came in today in a wheelchair. He has not had fevers or chills.   Current Outpatient Prescriptions  Medication Sig Dispense Refill  . acetaminophen (TYLENOL) 325 MG tablet Take 2 tablets (650 mg total) by mouth every 6 (six) hours as needed (pain).      Marland Kitchen allopurinol (ZYLOPRIM) 100 MG tablet Take 100 mg by mouth 2 (two) times daily.       Marland Kitchen aspirin 81 MG EC tablet Take 325 mg by mouth daily.      . calcitRIOL (ROCALTROL) 0.25 MCG capsule Take 0.25 mcg by mouth every Monday, Wednesday, and Friday.       . cloNIDine (CATAPRES) 0.2 MG tablet Take 0.2 mg by mouth 2 (two) times daily.      . clopidogrel (PLAVIX) 75 MG tablet Take 75 mg by mouth daily. Stopped on 4-10 for procedure      . hydrALAZINE (APRESOLINE) 25 MG tablet Take 50 mg by mouth 1 day or 1 dose.      . metoprolol (LOPRESSOR) 50 MG tablet Take 1 tablet (50 mg total) by mouth 4 (four) times daily.  120 tablet  1  . simvastatin (ZOCOR) 40 MG tablet Take 40 mg by mouth daily.      . Vitamin D, Ergocalciferol, (DRISDOL) 50000 UNITS CAPS Take 50,000 Units by mouth every 7 (seven) days. Take Tuesday       . DISCONTD: hydrALAZINE (APRESOLINE) 25 MG tablet Take 1 tablet (25 mg total) by mouth every 6 (six) hours.  120 tablet  1  . furosemide (LASIX) 80 MG tablet Take 1 tablet (80 mg total) by mouth 2 times daily at 12 noon and 4 pm.  60 tablet  1      Physical Exam:   BP 110/66  Pulse 66  Resp 18   Ht 5\' 8"  (1.727 m)  Wt 180 lb (81.647 kg)  BMI 27.37 kg/m2  SpO2 96%  General:  Well-nourished male in no acute distress  Chest:   Clear to auscultation  CV:   Regular rate and rhythm  Incisions:  Sternal incision is healing nicely  Abdomen:  Soft and nontender  Extremities:  Severe swelling and blanching erythema involving right lower leg with palpable fluctuance over the saphenous vein harvest tunnel just below the knee. All the skin incisions remained intact.  Diagnostic Tests:  n/a   Impression:  Severe cellulitis involving the right lower extremity saphenous vein harvest wound.  Plan:  We will admit the patient directly to the hospital for intravenous antibiotics. We'll obtain lower extremity duplex scan rule out DVT. We will make sure that the nephrology team and nose that he has been readmitted. For the time being we'll plan to give Lasix intravenously.   Salvatore Decent. Cornelius Moras, MD 04/13/2012 4:24 PM

## 2012-04-14 DIAGNOSIS — M79609 Pain in unspecified limb: Secondary | ICD-10-CM

## 2012-04-14 DIAGNOSIS — M7989 Other specified soft tissue disorders: Secondary | ICD-10-CM

## 2012-04-14 LAB — GLUCOSE, CAPILLARY: Glucose-Capillary: 116 mg/dL — ABNORMAL HIGH (ref 70–99)

## 2012-04-14 MED ORDER — CLONIDINE HCL 0.2 MG PO TABS
0.2000 mg | ORAL_TABLET | ORAL | Status: AC
  Administered 2012-04-14: 0.2 mg via ORAL
  Filled 2012-04-14: qty 1

## 2012-04-14 MED ORDER — LIDOCAINE HCL (PF) 1 % IJ SOLN
INTRAMUSCULAR | Status: AC
  Administered 2012-04-14: 11:00:00
  Filled 2012-04-14: qty 5

## 2012-04-14 MED ORDER — METOPROLOL TARTRATE 50 MG PO TABS
50.0000 mg | ORAL_TABLET | ORAL | Status: DC | PRN
  Filled 2012-04-14: qty 1

## 2012-04-14 MED ORDER — METOPROLOL TARTRATE 50 MG PO TABS
50.0000 mg | ORAL_TABLET | ORAL | Status: DC
  Administered 2012-04-14 – 2012-04-16 (×5): 50 mg via ORAL
  Filled 2012-04-14 (×6): qty 1

## 2012-04-14 MED ORDER — CLONIDINE HCL 0.2 MG PO TABS
0.2000 mg | ORAL_TABLET | ORAL | Status: DC
  Administered 2012-04-14 – 2012-04-15 (×2): 0.2 mg via ORAL
  Filled 2012-04-14 (×3): qty 1

## 2012-04-14 NOTE — Progress Notes (Signed)
INITIAL ADULT NUTRITION ASSESSMENT Date: 04/14/2012   Time: 2:34 PM  Reason for Assessment: Nutrition Risk Report  ASSESSMENT: [name]  Dx: Cellulitis  Hx:  Past Medical History  Diagnosis Date  . Hypertension   . CAD (coronary artery disease)   . Elevated cholesterol   . Ventricular hypertrophy   . Gout   . Pharyngitis   . Peripheral vascular disease   . Heart murmur   . Migraines     "last one 1990's"  . Stroke 1995; 2001    denies residual  . 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 Cellulitis 04/13/2012    Right lower extremity saphenous vein harvest incision  . Hyperparathyroidism   . Chronic kidney disease (CKD), stage IV (severe)     "not getting dialysis yet" (04/13/12)    Related Meds:  Past Medical History  Diagnosis Date  . Hypertension   . CAD (coronary artery disease)   . Elevated cholesterol   . Ventricular hypertrophy   . Gout   . Pharyngitis   . Peripheral vascular disease   . Heart murmur   . Migraines     "last one 1990's"  . Stroke 1995; 2001    denies residual  . 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 Cellulitis 04/13/2012    Right lower extremity saphenous vein harvest incision  . Hyperparathyroidism   . Chronic kidney disease (CKD), stage IV (severe)     "not getting dialysis yet" (04/13/12)    Ht: 5\' 8"  (172.7 cm)  Wt: 176 lb 2.4 oz (79.9 kg)  Ideal Wt: 70 kg % Ideal Wt: 114%  Usual Wt: ~ 190 lb % Usual Wt: 92%  Body mass index is 26.78 kg/(m^2).  Food/Nutrition Related Hx: unintentional weight loss > 10 lbs within the last month per admission nutrition screen  Labs:  CMP     Component Value Date/Time   NA 139 04/13/2012 1757   K 3.5 04/13/2012 1757   CL 98 04/13/2012 1757   CO2 27 04/13/2012 1757   GLUCOSE 100* 04/13/2012 1757   BUN 93*  04/13/2012 1757   CREATININE 7.59* 04/13/2012 1757   CALCIUM 10.3 04/13/2012 1757   PROT 6.2 11/05/2011 1111   ALBUMIN 3.4* 04/13/2012 1757   AST 28 11/05/2011 1111   ALT 24 11/05/2011 1111   ALKPHOS 46 11/05/2011 1111   BILITOT 0.4 11/05/2011 1111   GFRNONAA 8* 04/13/2012 1757   GFRAA 9* 04/13/2012 1757     Intake/Output Summary (Last 24 hours) at 04/14/12 1435 Last data filed at 04/14/12 1230  Gross per 24 hour  Intake    496 ml  Output    700 ml  Net   -204 ml    Diet Order: Carb Control  Supplements/Tube Feeding: N/A  IVF: N/A  Estimated Nutritional Needs:   Kcal: 2000-2200 Protein: 100-110 Fluid: 2.0-2.2 L  Patient admitted with cellulitis of the R lower extremity related to saphenous vein harvest CABG in April; on IV abx; reports a decreased appetite since his surgery; PO intake at 50% per flowsheet records  PTA patient was consuming his regular food choices just in smaller portions -- RD interprets this intake to be < 75% of patient's  estimated energy requirement; reports a 15 lb weight loss x 1 month (8%); patient declining addition of supplements at this time  Patient meets criteria for severe malnutrition in the context of chronic illness given < 75% intake of estimated energy requirement for > 1 month and 8% weight loss x 1 month  NUTRITION DIAGNOSIS: -Inadequate oral intake (NI-2.1).  Status: Ongoing  RELATED TO: decreased appetite  AS EVIDENCE BY: PO intake 50%  MONITORING/EVALUATION(Goals): Goal: meet >90% of estimated nutrition needs Monitor: PO intake, weight, labs, I/O's  EDUCATION NEEDS: -No education needs identified at this time  INTERVENTION:  No nutrition intervention at this time -- pt declined  RD to follow for nutrition care plan  Dietitian #: [phone]  DOCUMENTATION CODES Per approved criteria  -Severe malnutrition in the context of chronic illness    [name] 04/14/2012, 2:34 PM

## 2012-04-14 NOTE — Progress Notes (Addendum)
  Subjective:  Mr. Colegrove has no new complaints this morning.   Objective: Vital signs in last 24 hours: Temp:  [97 F (36.1 C)-98.5 F (36.9 C)] 98.5 F (36.9 C) (05/14 1402) Pulse Rate:  [63-68] 67  (05/14 1402) Cardiac Rhythm:  [-] Normal sinus rhythm (05/14 0725) Resp:  [16-18] 18  (05/14 1402) BP: (110-129)/(66-80) 110/71 mmHg (05/14 1402) SpO2:  [93 %-97 %] 96 % (05/14 1402) Weight:  [176 lb 2.4 oz (79.9 kg)-180 lb (81.647 kg)] 176 lb 2.4 oz (79.9 kg) (05/13 1700)  Intake/Output from previous day: 05/13 0701 - 05/14 0700 In: -  Out: 375 [Urine:375] Intake/Output this shift: Total I/O In: 496 [P.O.:480; IV Piggyback:16] Out: 325 [Urine:325]  General appearance: alert, cooperative and no distress Heart: regular rate and rhythm Lungs: clear to auscultation bilaterally Abdomen: soft, non-tender; bowel sounds normal; no masses,  no organomegaly Extremities: edema trace Wound: RLE appears cellulitic, suspected seroma in mid calf from [facility] site  Lab Results:  [facility] 04/13/12 1757  WBC 5.7  HGB 11.9*  HCT 37.1*  PLT 239   BMET:  Basename 04/13/12 1757  NA 139  K 3.5  CL 98  CO2 27  GLUCOSE 100*  BUN 93*  CREATININE 7.59*  CALCIUM 10.3    PT/INR: No results found for this basename: LABPROT,INR in the last 72 hours ABG    Component Value Date/Time   PHART 7.333* 03/19/2012 2201   HCO3 21.0 03/19/2012 2201   TCO2 22 03/20/2012 1658   ACIDBASEDEF 4.0* 03/19/2012 2201   O2SAT 93.0 03/19/2012 2201   CBG (last 3)  No results found for this basename: GLUCAP:3 in the last 72 hours  Assessment/Plan:  1. RLE Cellulitis- continue IV ABX 2. RLE seroma- attempted aspiration with minimal evacuation of old blood, fluid sent for culture 3. CRF- will contact Neprhology service  4. HTN- continue home medications 5. Cultures pending   LOS: 1 day    BARRETT, ERIN 04/14/2012   I have seen and examined the patient and agree with the assessment and plan as  outlined.  [name] 04/14/2012 5:33 PM

## 2012-04-14 NOTE — Progress Notes (Signed)
VASCULAR LAB PRELIMINARY  PRELIMINARY  PRELIMINARY  PRELIMINARY  Right lower extremity venous duplex completed.    Preliminary report:  No evidence of DVT.  GSV is thrombosed just past the SFJ.  There are irregular hypognenic areas surrounding the incisions.    Terance Hart,  RVT 04/14/2012, 9:19 AM

## 2012-04-14 NOTE — Consult Note (Signed)
Pt quit smoking on 03/15/12. Congratulated and encouraged pt to remain tobacco free. Reviewed relapse prevention strategies and referred to 1-800 quit now for f/u and support. Discussed oral fixation substitutes, second hand smoke and in home smoking policy. Reviewed and gave pt Written education/contact information.

## 2012-04-14 NOTE — Progress Notes (Signed)
Cardiac Rehab 1445 On arrival pt sleeping. Pt's wife stopped  [place] at the door and said he was sleeping. Will follow pt tomorrow , hopefully will be able to start ambulating.

## 2012-04-14 NOTE — Progress Notes (Signed)
UR Completed. Simmons, Juddson Cobern F [phone]  

## 2012-04-15 LAB — GLUCOSE, CAPILLARY: Glucose-Capillary: 91 mg/dL (ref 70–99)

## 2012-04-15 MED ORDER — PNEUMOCOCCAL 13-VAL CONJ VACC IM SUSP
0.5000 mL | INTRAMUSCULAR | Status: DC
  Filled 2012-04-15: qty 0.5

## 2012-04-15 MED ORDER — VANCOMYCIN HCL IN DEXTROSE 1-5 GM/200ML-% IV SOLN
1000.0000 mg | INTRAVENOUS | Status: DC
  Administered 2012-04-15: 1000 mg via INTRAVENOUS
  Filled 2012-04-15: qty 200

## 2012-04-15 MED ORDER — CLOPIDOGREL BISULFATE 75 MG PO TABS
75.0000 mg | ORAL_TABLET | Freq: Every day | ORAL | Status: DC
  Administered 2012-04-16: 75 mg via ORAL
  Filled 2012-04-15 (×2): qty 1

## 2012-04-15 MED ORDER — NISOLDIPINE ER 17 MG PO TB24
17.0000 mg | ORAL_TABLET | Freq: Two times a day (BID) | ORAL | Status: DC
  Administered 2012-04-15 – 2012-04-16 (×3): 17 mg via ORAL
  Filled 2012-04-15 (×5): qty 1

## 2012-04-15 NOTE — Progress Notes (Signed)
ANTIBIOTIC CONSULT NOTE - FOLLOW UP  Pharmacy Consult for vancomycin/cipro Indication: RLE cellulitis  Allergies  Allergen Reactions  . Penicillins Itching and Rash    Patient Measurements: Height: 5\' 8"  (172.7 cm) Weight: 176 lb 2.4 oz (79.9 kg) IBW/kg (Calculated) : 68.4   Vital Signs: Temp: 98.1 F (36.7 C) (05/15 0625) Temp src: Oral (05/15 0625) BP: 129/85 mmHg (05/15 0625) Pulse Rate: 63  (05/15 0625) Intake/Output from previous day: 05/14 0701 - 05/15 0700 In: 904 [P.O.:480; IV Piggyback:424] Out: 1025 [Urine:1025] Intake/Output from this shift: Total I/O In: 240 [P.O.:240] Out: 250 [Urine:250]  Labs:  [facility] 04/13/12 1757  WBC 5.7  HGB 11.9*  PLT 239  LABCREA --  CREATININE 7.59*   Estimated Creatinine Clearance: 11.5 ml/min (by C-G formula based on Cr of 7.59).    Anti-infectives     Start     Dose/Rate Route Frequency Ordered Stop   04/13/12 1830   vancomycin (VANCOCIN) 2,000 mg in sodium chloride 0.9 % 500 mL IVPB        2,000 mg 250 mL/hr over 120 Minutes Intravenous  Once 04/13/12 1716 04/13/12 2314   04/13/12 1830   ciprofloxacin (CIPRO) IVPB 400 mg        400 mg 200 mL/hr over 60 Minutes Intravenous Every 24 hours 04/13/12 1716            Assessment: 51 yo male with RLE cellulitis on vancomycin and cipro. Patient noted with CKD stage 5 with no HD plans noted. RL Abscess cultures are NGTD and pending.  Goal of Therapy:  Vancomycin trough level 10-15 mcg/ml  Plan:  -Will continue vancomycin as 1000mg  IV q48h (next dose today at 9:00pm) -Will consider a vancomycin level later this week.  Harland German, Pharm D 04/15/2012 10:27 AM

## 2012-04-15 NOTE — Progress Notes (Signed)
Subjective:  Patient is well known to [facility] and Dr. Eliott Nine.  He has advanced CKD, stage 4-5 and was planning for an LRD transplant from his son when was discovered to have CAD and required CABG this month.  He did well from a kidney standpoint, did not require any dialytic support and went home.  BUN and crt on 5/9 were 110 and 9.25 and patient was not noted top be uremic needing initiation of HD.  He now has a cellulitis/abscess of his vein graft site on his RLE and was admitted to the hospital for IV abx.  Creatinine is in the 7's. He continues to deny overwhelming uremic symptoms which would require HD therapy.    Objective Vital signs in last 24 hours: Filed Vitals:   04/14/12 2114 04/14/12 2359 04/15/12 0625 04/15/12 1424  BP: 142/87 151/86 129/85 119/82  Pulse: 61  63 63  Temp: 97.7 F (36.5 C)  98.1 F (36.7 C) 98.3 F (36.8 C)  TempSrc: Oral  Oral Oral  Resp: 16  17 18   Height:      Weight:      SpO2: 98%  98% 97%   Weight change:   Intake/Output Summary (Last 24 hours) at 04/15/12 1502 Last data filed at 04/15/12 1059  Gross per 24 hour  Intake    648 ml  Output   1090 ml  Net   -442 ml   Labs: Basic Metabolic Panel:  Lab 04/13/12 1610  NA 139  K 3.5  CL 98  CO2 27  GLUCOSE 100*  BUN 93*  CREATININE 7.59*  CALCIUM 10.3  ALB --  PHOS 6.2*   Liver Function Tests:  Lab 04/13/12 1757  AST --  ALT --  ALKPHOS --  BILITOT --  PROT --  ALBUMIN 3.4*   No results found for this basename: LIPASE:3,AMYLASE:3 in the last 168 hours No results found for this basename: AMMONIA:3 in the last 168 hours CBC:  Lab 04/13/12 1757  WBC 5.7  NEUTROABS --  HGB 11.9*  HCT 37.1*  MCV 87.9  PLT 239   Cardiac Enzymes: No results found for this basename: CKTOTAL:5,CKMB:5,CKMBINDEX:5,TROPONINI:5 in the last 168 hours CBG:  Lab 04/15/12 0612 04/14/12 2113  GLUCAP 91 116*    Iron Studies: No results found for this basename: IRON,TIBC,TRANSFERRIN,FERRITIN in the last 72  hours Studies/Results: No results found. Medications: Infusions:    Scheduled Medications:    . allopurinol  100 mg Oral BID  . aspirin EC  325 mg Oral Daily  . calcitRIOL  0.25 mcg Oral Q M,W,F  . ciprofloxacin  400 mg Intravenous Q24H  . cloNIDine  0.2 mg Oral Q24H  . clopidogrel  75 mg Oral Q breakfast  . docusate sodium  100 mg Oral Daily  . enoxaparin (LOVENOX) injection  30 mg Subcutaneous Q24H  . furosemide  80 mg Intravenous BID  . hydrALAZINE  50 mg Oral 1 day or 1 dose  . metoprolol tartrate  50 mg Oral Custom  . nisoldipine  17 mg Oral BID  . simvastatin  40 mg Oral Daily  . vancomycin  1,000 mg Intravenous Q48H  . Vitamin D (Ergocalciferol)  50,000 Units Oral Q Tue  . DISCONTD: cloNIDine  0.2 mg Oral Daily  . DISCONTD: metoprolol  50 mg Oral QID    have reviewed scheduled and prn medications.  Physical Exam: General:alert, pleasant, lying in bed, wife is at the bedside.  Heart: RRR Lungs: clear to auscultation bilaterally Abdomen: soft,  nontender, nondistended Extremities: no edema.  On his right leg is golf ball sized mass which is tender.  There is no surrounding erythema Dialysis Access: Has a left forearm AVF, good thrill and bruit   I Assessment/ Plan: Pt is a 51 y.o. yo male advanced CKD who was admitted on 04/13/2012 with  Cellulitis/abscess of his vein graft site  Assessment/Plan: 1. Renal- advanced CKD- is stable at this time and he is not uremic.  There are no indications to start HD.   Plan should still be for arranged transplant in the future.   2. Cellulitis-  Currently on cipro and vanc, reportedly improved.  Per primary team 3. Anemia- stable 4. Secondary hyperparathyroidism - continue rocaltrol 5. HTN/volume- well controlled on clonidine, hydralazine, lopressor, and sular.  Have been giving IV lasix in hospital. Home dose of lasix was 80 BID.  If increased dose is desired, would do 160 in AM and 80 in PM at time of d/c   Parisa Pinela  A   04/15/2012,3:02 PM  LOS: 2 days

## 2012-04-15 NOTE — Progress Notes (Signed)
CARDIAC REHAB PHASE I   PRE:  Rate/Rhythm: 67 SR    BP: sitting 130/80    SaO2: 99  RA  MODE:  Ambulation: 350 ft   POST:  Rate/Rhythm: 68    BP: sitting 130/88     SaO2: 98 RA  Pt ambulated 350 ft assist x2 with RW. Sts leg painful but able to get heel closer to floor as increased distance. Had to put increased weight on arms, esp. Initially. Sts sternum does not hurt. To recliner after walk. This was pts first walk in weeks. Has not been motivated in light of "swelling" Sts he acquired W/C and ramp at home. Encouraged pt to move leg/foot in bed-sts it hurts and he can't take pain meds. Suggest PT to assist in increasing mobility and to give pt ROM exercises for when sitting/lying. Will continue to follow. Encouraged pt to walk with RN/family as well. [id number]  Harriet Masson CES, ACSM

## 2012-04-15 NOTE — Progress Notes (Addendum)
Subjective:  [name] complains of right leg pain.  He states that yesterday he was able to get up and put weight on the leg which was uncomfortable but not as bad as it had been.  However, he states he is still unable to straighten [name] leg completely  Objective: Vital signs in last 24 hours: Temp:  [97.7 F (36.5 C)-98.5 F (36.9 C)] 98.1 F (36.7 C) (05/15 0625) Pulse Rate:  [61-67] 63  (05/15 0625) Cardiac Rhythm:  [-] Normal sinus rhythm (05/14 2020) Resp:  [16-18] 17  (05/15 0625) BP: (110-151)/(71-87) 129/85 mmHg (05/15 0625) SpO2:  [96 %-98 %] 98 % (05/15 0625)  Intake/Output from previous day: 05/14 0701 - 05/15 0700 In: 904 [P.O.:480; IV Piggyback:424] Out: 1025 [Urine:1025] Intake/Output this shift: Total I/O In: -  Out: 250 [Urine:250]  General appearance: alert, cooperative and no distress Heart: regular rate and rhythm Lungs: clear to auscultation bilaterally Abdomen: soft, non-tender; bowel sounds normal; no masses,  no organomegaly Extremities: Right LE edematous, warm to touch,  Wound: clean and dry, small seroma along EVH incisions in right calrf  Lab Results:  [facility] 04/13/12 1757  WBC 5.7  HGB 11.9*  HCT 37.1*  PLT 239   BMET:  Basename 04/13/12 1757  NA 139  K 3.5  CL 98  CO2 27  GLUCOSE 100*  BUN 93*  CREATININE 7.59*  CALCIUM 10.3    PT/INR: No results found for this basename: LABPROT,INR in the last 72 hours ABG    Component Value Date/Time   PHART 7.333* 03/19/2012 2201   HCO3 21.0 03/19/2012 2201   TCO2 22 03/20/2012 1658   ACIDBASEDEF 4.0* 03/19/2012 2201   O2SAT 93.0 03/19/2012 2201   CBG (last 3)   Basename 04/15/12 0612 04/14/12 2113  GLUCAP 91 116*    Assessment/Plan:  1. RLE cellulitis- will continue Cipro 2. Right Leg Seroma- after drainage yesterday, feel most likely a small hematoma, fluid sent for culture, gram stain negative, culture pending 3. CRF- spoke with nephrology service yesterday, will see patient  tody 4. HTN- will continue home clonidine, hydralazine, metoprolol 5. Dispo- patient r/o for DVT, still with RLE pain on antibiotics, will aim for d/c later this week   LOS: 2 days    BARRETT, ERIN 04/15/2012   I have seen and examined the patient and agree with the assessment and plan as outlined.  [name] leg looks much better than it did 2 days ago, primarily due to decreased lower extremity swelling.  Culture no growth so far.  Will get PT consult to recommend range of motion exercises for right leg.  Possible d/c home 1-2 days if cultures remain negative.  Will ask Nephrology to comment on whether or not to change diuretic dose for d/c since IV lasix seems to have really helped.  Recheck renal function in am.  Saben Donigan H 04/15/2012 11:58 AM

## 2012-04-16 LAB — RENAL FUNCTION PANEL
Albumin: 2.9 g/dL — ABNORMAL LOW (ref 3.5–5.2)
GFR calc Af Amer: 9 mL/min — ABNORMAL LOW (ref >90)
Phosphorus: 6.9 mg/dL — ABNORMAL HIGH (ref 2.3–4.6)
Potassium: 3.5 mEq/L (ref 3.5–5.1)
Sodium: 136 mEq/L (ref 135–145)

## 2012-04-16 MED ORDER — CIPROFLOXACIN HCL 500 MG PO TABS
500.0000 mg | ORAL_TABLET | ORAL | Status: DC
  Filled 2012-04-16: qty 1

## 2012-04-16 MED ORDER — FUROSEMIDE 80 MG PO TABS
80.0000 mg | ORAL_TABLET | Freq: Every day | ORAL | Status: DC
  Filled 2012-04-16: qty 1

## 2012-04-16 MED ORDER — PNEUMOCOCCAL VAC POLYVALENT 25 MCG/0.5ML IJ INJ
0.5000 mL | INJECTION | Freq: Once | INTRAMUSCULAR | Status: AC
  Administered 2012-04-16: 0.5 mL via INTRAMUSCULAR
  Filled 2012-04-16: qty 0.5

## 2012-04-16 MED ORDER — FUROSEMIDE 80 MG PO TABS: 160.0000 mg | ORAL_TABLET | Freq: Every day | ORAL | Status: DC

## 2012-04-16 MED ORDER — FUROSEMIDE 80 MG PO TABS: 80.0000 mg | ORAL_TABLET | Freq: Two times a day (BID) | ORAL | Status: DC

## 2012-04-16 MED ORDER — METOPROLOL TARTRATE 50 MG PO TABS: 50.0000 mg | ORAL_TABLET | Freq: Three times a day (TID) | ORAL | Status: DC

## 2012-04-16 MED ORDER — CIPROFLOXACIN HCL 500 MG PO TABS: 500.0000 mg | ORAL_TABLET | Freq: Two times a day (BID) | ORAL | Status: DC

## 2012-04-16 MED ORDER — METOPROLOL TARTRATE 50 MG PO TABS: 50.0000 mg | ORAL_TABLET | ORAL | Status: DC | PRN

## 2012-04-16 MED ORDER — OXYCODONE HCL 10 MG PO TABS: 10.0000 mg | ORAL_TABLET | ORAL | Status: AC | PRN

## 2012-04-16 MED ORDER — ASPIRIN 325 MG PO TBEC: 325.0000 mg | DELAYED_RELEASE_TABLET | Freq: Every day | ORAL | Status: DC

## 2012-04-16 NOTE — Care Management Note (Signed)
    Page 1 of 1   04/16/2012     3:38:53 PM   CARE MANAGEMENT NOTE 04/16/2012  Patient:  [name], [name]   Account Number:  [id number]  Date Initiated:  04/16/2012  Documentation initiated by:  Bert Ptacek  Subjective/Objective Assessment:   PT ADMITTED WITH CELLULITIS OF RLE.  PTA, PT INDEPENDENT, LIVES WITH SPOUSE.     Action/Plan:   CM REFERRAL FOR HOME HEALTH AND DME NEEDS.  REFERRAL TO [facility], PER PT CHOICE.  START OF CARE 24-48H POST DC DATE.   Anticipated DC Date:  04/16/2012   Anticipated DC Plan:  HOME W HOME HEALTH SERVICES      DC Planning Services  CM consult      [facility] Choice  HOME HEALTH   Choice offered to / List presented to:  C-1 Patient   DME arranged  [facility]  [facility]UB [facility]      DME agency  [facility]     HH arranged  HH-2 PT      [facility] agency  [facility]   Status of service:  Completed, signed off Medicare Important Message given?   (If response is "NO", the following Medicare IM given date fields will be blank) Date Medicare IM given:   Date Additional Medicare IM given:    Discharge Disposition:  HOME W HOME HEALTH SERVICES  Per UR Regulation:    If discussed at Long Length of Stay Meetings, dates discussed:    Comments:  04/16/12 Osa Campoli,RN,BSN 1530 [facility] TO DELIVER DME TO PT'S ROOM PRIOR TO DC HOME.

## 2012-04-16 NOTE — Discharge Summary (Addendum)
[address]            [place] [address]          [phone]      [name] [birth year] 51 y.o. [id number]  04/13/2012   [name], MD  right leg celluitis  HPI:  He should is a 51 year old male with end-stage renal disease 11 coronary artery bypass grafting on 03/19/2012. He returned to the office today for routine followup complaining of severe pain and swelling involving his right lower extremity. He states that shortly after going home the swelling got worse and over the last 2 weeks the pain is gotten to the point where he cannot walk at all. He has not had any fevers or chills. He is not having shortness of breath. He has minimal soreness in his chest. Will he was felt to require admission for further evaluation and treatment to include intravenous antibiotics  No family history on file.  Social History  History   Substance Use Topics   .  Smoking status:  Former Smoker -- 0.3 packs/day for 30 years     Types:  Cigarettes     Quit date:  03/15/2012   .  Smokeless tobacco:  Never Used   .  Alcohol Use:  Yes      03/16/12 "2 mixed drinks/month"    Current Outpatient Prescriptions   Medication  Sig  Dispense  Refill   .  acetaminophen (TYLENOL) 325 MG tablet  Take 2 tablets (650 mg total) by mouth every 6 (six) hours as needed (pain).     Marland Kitchen  allopurinol (ZYLOPRIM) 100 MG tablet  Take 100 mg by mouth 2 (two) times daily.     Marland Kitchen  aspirin 81 MG EC tablet  Take 325 mg by mouth daily.     .  calcitRIOL (ROCALTROL) 0.25 MCG capsule  Take 0.25 mcg by mouth every Monday, Wednesday, and Friday.     .  cloNIDine (CATAPRES) 0.2 MG tablet  Take 0.2 mg by mouth 2 (two) times daily.     .  clopidogrel (PLAVIX) 75 MG tablet  Take 75 mg by mouth daily. Stopped on 4-10 for procedure     .  hydrALAZINE (APRESOLINE) 25 MG tablet  Take 50 mg by mouth 1 day or 1 dose.     .  metoprolol (LOPRESSOR) 50 MG tablet  Take 1 tablet (50 mg total) by mouth 4  (four) times daily.  120 tablet  1   .  simvastatin (ZOCOR) 40 MG tablet  Take 40 mg by mouth daily.     .  Vitamin D, Ergocalciferol, (DRISDOL) 50000 UNITS CAPS  Take 50,000 Units by mouth every 7 (seven) days. Take Tuesday     .  DISCONTD: hydrALAZINE (APRESOLINE) 25 MG tablet  Take 1 tablet (25 mg total) by mouth every 6 (six) hours.  120 tablet  1   .  furosemide (LASIX) 80 MG tablet  Take 1 tablet (80 mg total) by mouth 2 times daily at 12 noon and 4 pm.  60 tablet  1    Allergies   Allergen  Reactions   .  Penicillins  Itching and Rash    Review of Systems: At time of admission General: decreased appetite, decreasede energy  Respiratory: no cough, no wheezing, no hemoptysis, no pain with inspiration or cough, no shortness of breath  Cardiac: no chest pain or tightness, no exertional SOB, no resting SOB, no PND, no orthopnea, + severe right LE edema, no palpitations, no syncope  GI: no difficulty swallowing, no hematochezia, no hematemesis, no melena, no constipation, no diarrhea  GU: no dysuria, no urgency, no frequency  Musculoskeletal: no arthritis, no arthralgia, severe pain in right lower extremity, unable to straighten it completely  Vascular: no pain suggestive of claudication  Neuro: no symptoms suggestive of TIA's, no seizures, no headaches, no peripheral neuropathy  Endocrine: Negative  HEENT: no loose teeth or painful teeth, no recent vision changes  Psych: no anxiety, no depression  Physical Exam: At time of admission BP 110/66  Pulse 66  Resp 18  Ht 5\' 8"  (1.727 m)  Wt 81.647 kg (180 lb)  BMI 27.37 kg/m2  SpO2 96%  General: Well nourished male in NAD  HEENT: Unremarkable  Neck: no JVD, no bruits, no adenopathy  Chest: clear to auscultation, symmetrical breath sounds, no wheezes, no rhonchi, sternum stable and healing nicely  CV: RRR, no murmur  Abdomen: soft, non-tender, no masses  Extremities: Severe edema right lower leg with blanching erythema and tenderness,  fluctuance over saphenous vein tunnel below the knee, incisions intact  Rectal/GU Deferred  Neuro: Grossly non-focal and symmetrical throughout  Skin: Clean and dry, no rashes, no breakdown     Hospital Course: The patient was admitted and placed on intravenous antibiotics, specifically vancomycin and Cipro. Additionally due to his known history of chronic renal disease nephrology consultation was obtained. He was placed on intravenous Lasix with an excellent response. He is not felt to require dialysis at this time. His oral dosing at time of discharge will be increased. The lower chimney swelling and erythema has shown significant improvement with time. Clinically, he continues to otherwise feel well and is having no specific new issues. Tentatively the plan will be to convert from intravenous antibiotics to oral Cipro and discharged home in the next day or so. Cultures so far from aspiration of the wound are negative for growth at this time.  Basename 04/16/12 0620 04/13/12 1757  NA 136 139  K 3.5 3.5  CL 96 98  CO2 21 27  GLUCOSE 93 100*  BUN 100* 93*  CALCIUM 9.6 10.3    Basename 04/13/12 1757  WBC 5.7  HGB 11.9*  HCT 37.1*  PLT 239   No results found for this basename: INR:2 in the last 72 hours   Discharge Instructions:  The patient is discharged to home with extensive instructions on wound care and progressive ambulation.  They are instructed not to drive or perform any heavy lifting until returning to see the physician in his office.  Discharge Diagnosis:  right leg celluitis  Secondary Diagnosis: Patient Active Problem List  Diagnoses  . Abnormal stress test  . Murmur  . HTN (hypertension)  . Smoking  . CRF (chronic renal failure)  . S/P CABG x 5  . Cellulitis   Past Medical History  Diagnosis Date  . Hypertension   . CAD (coronary artery disease)   . Elevated cholesterol   . Ventricular hypertrophy   . Gout   . Pharyngitis   . Peripheral vascular disease    . Heart murmur   . Migraines     "last one 1990's"  . Stroke 1995; 2001    denies residual  . 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 Cellulitis 04/13/2012    Right lower extremity saphenous vein harvest incision  . Hyperparathyroidism   . Chronic kidney disease (CKD), stage IV (severe)     "not getting dialysis yet" (04/13/12)       Lattner, Davey Limas  Home Medication Instructions [id number]   Printed on:04/16/12 0902  Medication Information                    allopurinol (ZYLOPRIM) 100 MG tablet Take 100 mg by mouth 2 (two) times daily.            Vitamin D, Ergocalciferol, (DRISDOL) 50000 UNITS CAPS Take 50,000 Units by mouth every 7 (seven) days. Take Tuesday            calcitRIOL (ROCALTROL) 0.25 MCG capsule Take 0.25 mcg by mouth every Monday, Wednesday, and Friday.            cloNIDine (CATAPRES) 0.2 MG tablet Take 0.2 mg by mouth daily.            clopidogrel (PLAVIX) 75 MG tablet Take 75 mg by mouth daily. Stopped on 4-10 for procedure           simvastatin (ZOCOR) 40 MG tablet Take 40 mg by mouth daily.           hydrALAZINE (APRESOLINE) 25 MG tablet Take 50 mg by mouth 1 day or 1 dose.           nisoldipine (SULAR) 20 MG 24 hr tablet Take 20 mg by mouth 2 (two) times daily.           aspirin EC 325 MG EC tablet Take 1 tablet (325 mg total) by mouth daily.           furosemide (LASIX) 80 MG tablet Take 2 tablets (160 mg total) by mouth every morning.Take 80 mg ( 1 tab) by mouth every evening.           metoprolol (LOPRESSOR) 50 MG tablet Take 1 tablet (50 mg total) by mouth 3 (three) times daily.           oxyCODONE 10 MG TABS Take 1 tablet (10 mg total) by mouth every 4 (four) hours as needed.           ciprofloxacin (CIPRO) 500 MG tablet Take 1 tablet (500 mg total) by mouth 2 (two) times daily for one week then stop.             Disposition: Discharge  home  Patient's condition is Good  Gershon Crane, PA-C 04/16/2012  9:02 AM

## 2012-04-16 NOTE — Discharge Instructions (Signed)

## 2012-04-16 NOTE — Evaluation (Signed)
Physical Therapy Evaluation Patient Details Name: [name] MRN: [id number] DOB: 1961-09-05 Today's Date: 04/16/2012 Time: 6578-4696 PT Time Calculation (min): 15 min  PT Assessment / Plan / Recommendation Clinical Impression  pt admitted with cellulitis R LE.  Pt unable to weight bear on his leg due to pain and swelling.  Pt can benefit from HHPT to help improve extension and gait.    PT Assessment  Patent does not need any further PT services    Follow Up Recommendations  Home health PT    Barriers to Discharge        lEquipment Recommendations  Rolling walker with 5" wheels;Other (comment) (Tub bench recs for HHPT)    Recommendations for Other Services     Frequency      Precautions / Restrictions Restrictions Weight Bearing Restrictions: No          Mobility  Bed Mobility Bed Mobility: Supine to Sit;Sitting - Scoot to Edge of Bed;Sit to Supine Supine to Sit: 7: Independent Sitting - Scoot to Edge of Bed: 7: Independent Sit to Supine: 7: Independent Transfers Transfers: Sit to Stand;Stand to Sit Sit to Stand: 5: Supervision;With upper extremity assist;From bed Stand to Sit: 5: Supervision;With upper extremity assist;To bed Details for Transfer Assistance: reinforced safe use of hand on the bed not RW Ambulation/Gait Ambulation/Gait Assistance: 5: Supervision Ambulation Distance (Feet): 70 Feet Assistive device: Rolling walker Ambulation/Gait Assistance Details: pt using step to gait with no more than TDWB on R with T knee extension lag o ~30 degrees due to pain and swelling from a hematoma. Gait Pattern: Step-to pattern Stairs: No    Exercises     PT Diagnosis:    PT Problem List:   PT Treatment Interventions:     PT Goals    Visit Information  Last PT Received On: 04/16/12 Assistance Needed: +1    Subjective Data  Subjective: I just can't put my foot down.. you don't have to push on my leg, you just have to lightly touch it to hurt Patient  Stated Goal: get this leg straight   Prior Functioning  Home Living Lives With: Spouse Available Help at Discharge: Family Home Adaptive Equipment: None Prior Function Level of Independence: Independent Able to Take Stairs?: Yes Driving: Yes Communication Communication: No difficulties    Cognition  Overall Cognitive Status: Appears within functional limits for tasks assessed/performed Arousal/Alertness: Awake/alert Orientation Level: Oriented X4 / Intact Behavior During Session: [facility] for tasks performed    Extremity/Trunk Assessment Right Lower Extremity Assessment RLE ROM/Strength/Tone: Due to pain;Unable to fully assess Left Lower Extremity Assessment LLE ROM/Strength/Tone: Within functional levels Trunk Assessment Trunk Assessment: Normal   Balance Balance Balance Assessed: No  End of Session PT - End of Session Activity Tolerance: Patient tolerated treatment well Patient left: in bed;with call bell/phone within reach Nurse Communication: Mobility status   [name], [name] 04/16/2012, 2:38 PM  04/16/2012  [name], PT [phone] (pager)

## 2012-04-16 NOTE — Discharge Summary (Signed)
I agree with the above discharge summary and plan for follow-up.  [name]  

## 2012-04-16 NOTE — Progress Notes (Signed)
I see that patient is being discharged today.  He has follow up with Dr. Eliott Nine June 3rd at 4:30.  He was told about the increased dose of lasix to go home on and will get labs pre appt.    [name] A

## 2012-04-16 NOTE — Progress Notes (Signed)
CARDIAC REHAB PHASE I   PRE:  Rate/Rhythm: 60 SR    BP: sitting 120/84    SaO2: 98 RA  MODE:  Ambulation: 550 ft   POST:  Rate/Rhythm: 65    BP: sitting 110/80     SaO2: 99 RA  Increased distance with RW, supervision x1. Sts pain about the same as yesterday. Couldn't put heel on floor. Declined recliner, sts he likes bed better. Walked x3 yest, encouraged the same today. [phone]  Harriet Masson CES, ACSM

## 2012-04-16 NOTE — Discharge Summary (Signed)
I agree with the above discharge summary and plan for follow-up.  Lovada Barwick H  

## 2012-04-16 NOTE — Progress Notes (Addendum)
[address]            [place] [address]          [phone]          Subjective: Still with mod. Leg discomfort  Objective  Telemetry Sr, Sbrady, PAC's  Temp:  [97.7 F (36.5 C)-98.4 F (36.9 C)] 98.4 F (36.9 C) (05/16 0617) Pulse Rate:  [61-68] 61  (05/16 0617) Resp:  [16-18] 16  (05/16 0617) BP: (102-125)/(66-82) 125/79 mmHg (05/16 0617) SpO2:  [96 %-98 %] 98 % (05/16 0617) Weight:  [176 lb 2.4 oz (79.9 kg)] 176 lb 2.4 oz (79.9 kg) (05/16 0500)   Intake/Output Summary (Last 24 hours) at 04/16/12 0813 Last data filed at 04/16/12 0600  Gross per 24 hour  Intake      0 ml  Output    790 ml  Net   -790 ml       General appearance: alert, cooperative and no distress Heart: irregular Lungs: clear to auscultation bilaterally Extremities: no edema Wound: min erethema + hematoma v fluid collection @ evh site  Lab Results:  [facility] 04/16/12 0620 04/13/12 1757  NA 136 139  K 3.5 3.5  CL 96 98  CO2 21 27  GLUCOSE 93 100*  BUN 100* 93*  CREATININE 7.39* 7.59*  CALCIUM 9.6 10.3  MG -- --  PHOS 6.9* 6.2*    Basename 04/16/12 0620 04/13/12 1757  AST -- --  ALT -- --  ALKPHOS -- --  BILITOT -- --  PROT -- --  ALBUMIN 2.9* 3.4*   No results found for this basename: LIPASE:2,AMYLASE:2 in the last 72 hours  Basename 04/13/12 1757  WBC 5.7  NEUTROABS --  HGB 11.9*  HCT 37.1*  MCV 87.9  PLT 239   No results found for this basename: CKTOTAL:4,CKMB:4,TROPONINI:4 in the last 72 hours No components found with this basename: POCBNP:3 No results found for this basename: DDIMER in the last 72 hours No results found for this basename: HGBA1C in the last 72 hours No results found for this basename: CHOL,HDL,LDLCALC,TRIG,CHOLHDL in the last 72 hours No results found for this basename: TSH,T4TOTAL,FREET3,T3FREE,THYROIDAB in the last 72 hours No results found for this basename: VITAMINB12,FOLATE,FERRITIN,TIBC,IRON,RETICCTPCT in  the last 72 hours  Medications: Scheduled    . allopurinol  100 mg Oral BID  . aspirin EC  325 mg Oral Daily  . calcitRIOL  0.25 mcg Oral Q M,W,F  . ciprofloxacin  400 mg Intravenous Q24H  . cloNIDine  0.2 mg Oral Q24H  . clopidogrel  75 mg Oral Q breakfast  . docusate sodium  100 mg Oral Daily  . enoxaparin (LOVENOX) injection  30 mg Subcutaneous Q24H  . furosemide  80 mg Intravenous BID  . hydrALAZINE  50 mg Oral 1 day or 1 dose  . metoprolol tartrate  50 mg Oral Custom  . nisoldipine  17 mg Oral BID  . pneumococcal 13-valent conjugate vaccine  0.5 mL Intramuscular Tomorrow-1000  . simvastatin  40 mg Oral Daily  . vancomycin  1,000 mg Intravenous Q48H  . Vitamin D (Ergocalciferol)  50,000 Units Oral Q Tue     Radiology/Studies:  No results found.  INR: Will add last result for INR, ABG once components are confirmed Will add last 4 CBG results once components are confirmed  Assessment/Plan:  stable on current rx Cultures negatine Poss d/c iv abx  soon  Poss home in am I/O - neg  740 /24 hours on IV lasix    LOS: 3 days    GOLD,WAYNE E 5/16/20138:13 AM   I have seen and examined the patient and agree with the assessment and plan as outlined.  Cultures remain negative.  Will d/c on 7 day course of oral Cipro.  Increase home lasix dose to 160 q am and 80 q pm per Dr Jon Gills rec.  Await follow up from PT.  Would he benefit from home PT?  Jayln Madeira H 04/16/2012 1:02 PM

## 2012-04-17 LAB — CULTURE, ROUTINE-ABSCESS: Gram Stain: NONE SEEN

## 2012-04-28 ENCOUNTER — Ambulatory Visit (INDEPENDENT_AMBULATORY_CARE_PROVIDER_SITE_OTHER): Payer: Self-pay | Admitting: Physician Assistant

## 2012-04-28 VITALS — BP 102/66 | HR 57 | Temp 97.1°F | Resp 18 | Ht 68.0 in | Wt 180.0 lb

## 2012-04-28 DIAGNOSIS — L0390 Cellulitis, unspecified: Secondary | ICD-10-CM

## 2012-04-28 DIAGNOSIS — Z951 Presence of aortocoronary bypass graft: Secondary | ICD-10-CM

## 2012-04-28 DIAGNOSIS — I2510 Atherosclerotic heart disease of native coronary artery without angina pectoris: Secondary | ICD-10-CM

## 2012-04-28 DIAGNOSIS — L0291 Cutaneous abscess, unspecified: Secondary | ICD-10-CM

## 2012-04-28 NOTE — Progress Notes (Signed)
  HPI: Patient returns for routine postoperative follow-up having undergone CABG on 03/19/2012. The patient present to Dr. Barry Dienes office on 04/13/2012 post operatively with a complaint of RLE pain and swelling.  After examination it was decided the patient was suffering from RLE cellulitis.  The patient was subsequently admitted to the hospital and placed on IV Antibiotics.  He underwent venous duplex study which was negative for DVT.  There was a small elevated area between the lower 2 incisions of his RLE suspicious for a seroma.  Drainage was attempted, however a very small amount of old blood was evacuated.  This was sent for culture and was negative.  During hospitalization the patient was treated with IV Lasix with good resolution of LE edema.  He was discharged home on Ciprofloxacin.  He presents today for wound check.  Overall the patient is doing better.  He states his leg is still somewhat swollen but has improved significantly since his hospital discharge.  He also states that his RLE still remains somewhat tender, but nothing that inhibits his walking.  He denies wound drainage, fevers, chills, or sweats.  Current Outpatient Prescriptions  Medication Sig Dispense Refill  . allopurinol (ZYLOPRIM) 100 MG tablet Take 100 mg by mouth 2 (two) times daily.       Marland Kitchen aspirin EC 325 MG EC tablet Take 1 tablet (325 mg total) by mouth daily.  30 tablet    . calcitRIOL (ROCALTROL) 0.25 MCG capsule Take 0.25 mcg by mouth every Monday, Wednesday, and Friday.       . cloNIDine (CATAPRES) 0.2 MG tablet Take 0.2 mg by mouth daily.       . clopidogrel (PLAVIX) 75 MG tablet Take 75 mg by mouth daily. Stopped on 4-10 for procedure      . furosemide (LASIX) 80 MG tablet Take 2 tablets (160 mg total) by mouth daily. every morning and Lasix 80 mg po every evening.  90 tablet  1  . hydrALAZINE (APRESOLINE) 25 MG tablet Take 50 mg by mouth 1 day or 1 dose.      . metoprolol (LOPRESSOR) 50 MG tablet Take 1 tablet (50 mg  total) by mouth 3 (three) times daily.  90 tablet  1  . nisoldipine (SULAR) 20 MG 24 hr tablet Take 20 mg by mouth 2 (two) times daily.      . simvastatin (ZOCOR) 40 MG tablet Take 40 mg by mouth daily.      . Vitamin D, Ergocalciferol, (DRISDOL) 50000 UNITS CAPS Take 50,000 Units by mouth every 7 (seven) days. Take Tuesday         Physical Exam:  BP 102/66  Pulse 57  Temp 97.1 F (36.2 C)  Resp 18  Ht 5\' 8"  (1.727 m)  Wt 180 lb (81.647 kg)  BMI 27.37 kg/m2  SpO2 98%  Gen: no apparent distress Lungs: CTA bilaterally Heart: RRR Extremities:  RLE still appears to be somewhat edematous, wounds are healing with no acute evidence of infection, minimal erythema present  Impression:  [name] is doing well.  He RLE cellulitis has resolved and his incisions are healing well.  He does have some RLE edema, however due to his renal issues this is not unexpected.  Plan:  1. Compression Stockings as needed for LE edema 2. Lasix- per Nephrologist 3. RTC in 2 weeks for final wound check.  Patient instructed to call our office sooner should his wounds become erythematous or draining

## 2012-05-11 ENCOUNTER — Ambulatory Visit: Payer: Self-pay | Admitting: Thoracic Surgery (Cardiothoracic Vascular Surgery)

## 2012-05-12 ENCOUNTER — Encounter: Payer: Self-pay | Admitting: Thoracic Surgery (Cardiothoracic Vascular Surgery)

## 2012-05-12 ENCOUNTER — Ambulatory Visit (INDEPENDENT_AMBULATORY_CARE_PROVIDER_SITE_OTHER): Payer: Self-pay | Admitting: Thoracic Surgery (Cardiothoracic Vascular Surgery)

## 2012-05-12 VITALS — BP 110/67 | HR 58 | Resp 18 | Ht 68.0 in | Wt 178.0 lb

## 2012-05-12 DIAGNOSIS — I2510 Atherosclerotic heart disease of native coronary artery without angina pectoris: Secondary | ICD-10-CM

## 2012-05-12 DIAGNOSIS — L0291 Cutaneous abscess, unspecified: Secondary | ICD-10-CM

## 2012-05-12 DIAGNOSIS — Z951 Presence of aortocoronary bypass graft: Secondary | ICD-10-CM

## 2012-05-12 NOTE — Patient Instructions (Signed)
The patient has been instructed that they may return driving an automobile as long as they are no longer requiring oral narcotic pain relievers during the daytime.  They have been advised to start driving short distances during the daylight and gradually increase from there as they feel comfortable. The patient has been reminded to continue to avoid any heavy lifting or strenuous use of arms or shoulders for at least a total of three months from the time of surgery. The patient may return to work in 2 weeks if he starts on a part-time basis with limited physical restrictions as outlined.

## 2012-05-12 NOTE — Progress Notes (Signed)
[address]            [place] [address]          [phone]     CARDIOTHORACIC SURGERY OFFICE NOTE  Referring Provider is [name], MD PCP is Mickie Hillier, MD, MD   HPI:  Patient returns for followup status post coronary artery bypass grafting x5 on 03/19/2012. He was last seen here in the office on May 28th 2013. Since then he has continued to improve nicely. His right lower leg cellulitis has now essentially resolved. He is ambulating much better and he has completed his physical therapy at home. He is ready to start the patient cardiac rehabilitation. He's not using any pain medicine. His kidney function has remained stable. Overall he has no complaints and he is pleased with his progress.   Current Outpatient Prescriptions  Medication Sig Dispense Refill  . allopurinol (ZYLOPRIM) 100 MG tablet Take 100 mg by mouth 2 (two) times daily.       Marland Kitchen aspirin EC 325 MG EC tablet Take 1 tablet (325 mg total) by mouth daily.  30 tablet    . calcitRIOL (ROCALTROL) 0.25 MCG capsule Take 0.25 mcg by mouth every Monday, Wednesday, and Friday.       . cloNIDine (CATAPRES) 0.2 MG tablet Take 0.2 mg by mouth daily.       . clopidogrel (PLAVIX) 75 MG tablet Take 75 mg by mouth daily. Stopped on 4-10 for procedure      . fenofibrate micronized (LOFIBRA) 134 MG capsule Take 134 mg by mouth daily before breakfast.      . furosemide (LASIX) 80 MG tablet Take 2 tablets (160 mg total) by mouth daily. every morning and Lasix 80 mg po every evening.  90 tablet  1  . hydrALAZINE (APRESOLINE) 25 MG tablet Take 50 mg by mouth 1 day or 1 dose.      . metoprolol (LOPRESSOR) 50 MG tablet Take 1 tablet (50 mg total) by mouth 3 (three) times daily.  90 tablet  1  . nisoldipine (SULAR) 20 MG 24 hr tablet Take 20 mg by mouth 2 (two) times daily.      . simvastatin (ZOCOR) 40 MG tablet Take 40 mg by mouth daily.      . Vitamin D, Ergocalciferol, (DRISDOL) 50000 UNITS  CAPS Take 50,000 Units by mouth every 7 (seven) days. Take Tuesday           Physical Exam:   BP 110/67  Pulse 58  Resp 18  Ht 5\' 8"  (1.727 m)  Wt 178 lb (80.74 kg)  BMI 27.06 kg/m2  SpO2 97%  General:  Well-appearing  Chest:   Clear to auscultation with symmetrical breath sounds  CV:   Regular rate and rhythm  Incisions:  Sternal incision has healed nicely. Right lower leg incisions are healing well  Abdomen:  Soft and nontender  Extremities:  Warm and well-perfused. Right lower leg cellulitis has resolved. There is very mild right lower leg edema.   Diagnostic Tests:  n/a   Impression:  Satisfactory progress now 2 months following coronary artery bypass grafting. Right lower leg cellulitis appears to have resolved.   Plan:  I've encouraged patient to continue to gradually increase his physical activity as tolerated with his only limitation remaining that he refrain from heavy lifting or strenuous use of his arms or shoulders for least  another month or so. I think he can go back to driving a car. I think within the next couple weeks he could go back to work as he can start on a limited basis and gradually increase from there. All of his questions been addressed. In the future he will call and return to see [place] here as needed.   Salvatore Decent. Cornelius Moras, MD 05/12/2012 2:25 PM

## 2012-06-08 ENCOUNTER — Ambulatory Visit: Payer: 59 | Attending: Thoracic Surgery (Cardiothoracic Vascular Surgery) | Admitting: Physical Therapy

## 2012-06-08 DIAGNOSIS — IMO0001 Reserved for inherently not codable concepts without codable children: Secondary | ICD-10-CM | POA: Insufficient documentation

## 2012-06-08 DIAGNOSIS — M25669 Stiffness of unspecified knee, not elsewhere classified: Secondary | ICD-10-CM | POA: Insufficient documentation

## 2012-06-11 ENCOUNTER — Ambulatory Visit: Payer: 59 | Admitting: Physical Therapy

## 2012-06-16 ENCOUNTER — Ambulatory Visit: Payer: 59 | Admitting: Physical Therapy

## 2012-06-17 ENCOUNTER — Ambulatory Visit: Payer: 59 | Admitting: Physical Therapy

## 2012-06-22 ENCOUNTER — Ambulatory Visit: Payer: 59

## 2012-06-24 ENCOUNTER — Ambulatory Visit: Payer: 59 | Admitting: Physical Therapy

## 2012-06-29 ENCOUNTER — Ambulatory Visit: Payer: 59

## 2012-07-01 ENCOUNTER — Encounter: Payer: 59 | Admitting: Physical Therapy

## 2012-07-06 ENCOUNTER — Ambulatory Visit: Payer: 59 | Attending: Thoracic Surgery (Cardiothoracic Vascular Surgery) | Admitting: Physical Therapy

## 2012-07-06 DIAGNOSIS — IMO0001 Reserved for inherently not codable concepts without codable children: Secondary | ICD-10-CM | POA: Insufficient documentation

## 2012-07-06 DIAGNOSIS — M25669 Stiffness of unspecified knee, not elsewhere classified: Secondary | ICD-10-CM | POA: Insufficient documentation

## 2012-07-09 ENCOUNTER — Ambulatory Visit (INDEPENDENT_AMBULATORY_CARE_PROVIDER_SITE_OTHER): Payer: 59 | Admitting: Cardiovascular Disease

## 2012-07-09 ENCOUNTER — Encounter: Payer: Self-pay | Admitting: Cardiovascular Disease

## 2012-07-09 VITALS — BP 127/83 | HR 62 | Ht 68.0 in | Wt 187.0 lb

## 2012-07-09 DIAGNOSIS — M79669 Pain in unspecified lower leg: Secondary | ICD-10-CM

## 2012-07-09 DIAGNOSIS — I4891 Unspecified atrial fibrillation: Secondary | ICD-10-CM

## 2012-07-09 DIAGNOSIS — N189 Chronic kidney disease, unspecified: Secondary | ICD-10-CM

## 2012-07-09 DIAGNOSIS — F17200 Nicotine dependence, unspecified, uncomplicated: Secondary | ICD-10-CM

## 2012-07-09 DIAGNOSIS — I10 Essential (primary) hypertension: Secondary | ICD-10-CM

## 2012-07-09 DIAGNOSIS — M79609 Pain in unspecified limb: Secondary | ICD-10-CM

## 2012-07-09 DIAGNOSIS — I480 Paroxysmal atrial fibrillation: Secondary | ICD-10-CM | POA: Insufficient documentation

## 2012-07-09 DIAGNOSIS — R600 Localized edema: Secondary | ICD-10-CM

## 2012-07-09 DIAGNOSIS — R609 Edema, unspecified: Secondary | ICD-10-CM

## 2012-07-09 DIAGNOSIS — Z951 Presence of aortocoronary bypass graft: Secondary | ICD-10-CM

## 2012-07-09 NOTE — Assessment & Plan Note (Signed)
He is on high dose lasix but has persistant LE edema due to endoscopic vein harvest and worse on left due to cellulitis.  Dr Eliott Nine dose not want to decrease renal perfusion but I think he needs more diuretics and ask him to discuss this with her next visit

## 2012-07-09 NOTE — Progress Notes (Signed)
Patient ID: [name], male   DOB: 1961/11/10, 51 y.o.   MRN:  Patient returns for followup status post coronary artery bypass grafting x5 on 03/19/2012  He had a brief period of PAF post op and some RLE swelling that persists.  On high dose lasix due to renal failure with Cr elevated from 4.5 range To 7 range.  Still with urine output.  Fistula placed in LUE but did not require dialysis post cath or post CABG.  Apparently plans are to get back on renal  Transplant list for April of 2014.  Dr Eliott Nine to see him in the next couple of weeks.    ROS: Denies fever, malais, weight loss, blurry vision, decreased visual acuity, cough, sputum, SOB, hemoptysis, pleuritic pain, palpitaitons, heartburn, abdominal pain, melena, lower extremity edema, claudication, or rash.  All other systems reviewed and negative  General: Affect appropriate Healthy:  appears stated age HEENT: normal Neck supple with no adenopathy JVP normal no bruits no thyromegaly Lungs clear with no wheezing and good diaphragmatic motion Heart:  S1/S2 no murmur, no rub, gallop or click PMI normal Abdomen: benighn, BS positve, no tenderness, no AAA no bruit.  No HSM or HJR Distal pulses intact with no bruits Fistula LUE with thrill Plus 2 bilateral edema Neuro non-focal Skin warm and dry No muscular weakness   Current Outpatient Prescriptions  Medication Sig Dispense Refill  . allopurinol (ZYLOPRIM) 100 MG tablet Take 100 mg by mouth 2 (two) times daily.       Marland Kitchen aspirin EC 325 MG EC tablet Take 1 tablet (325 mg total) by mouth daily.  30 tablet    . calcitRIOL (ROCALTROL) 0.25 MCG capsule Take 0.25 mcg by mouth every Monday, Wednesday, and Friday.       . cloNIDine (CATAPRES) 0.2 MG tablet Take 0.2 mg by mouth daily.       . clopidogrel (PLAVIX) 75 MG tablet Take 75 mg by mouth daily. Stopped on 4-10 for procedure      . fenofibrate micronized (LOFIBRA) 134 MG capsule Take 134 mg by mouth daily before breakfast.      .  furosemide (LASIX) 80 MG tablet Take 2 tablets (160 mg total) by mouth daily. every morning and Lasix 80 mg po every evening.  90 tablet  1  . hydrALAZINE (APRESOLINE) 25 MG tablet Take 50 mg by mouth 1 day or 1 dose.      . metoprolol (LOPRESSOR) 50 MG tablet Take 1 tablet (50 mg total) by mouth 3 (three) times daily.  90 tablet  1  . nisoldipine (SULAR) 20 MG 24 hr tablet Take 20 mg by mouth 2 (two) times daily.      Marland Kitchen omega-3 acid ethyl esters (LOVAZA) 1 G capsule Take 2 g by mouth 2 (two) times daily.      . simvastatin (ZOCOR) 40 MG tablet Take 40 mg by mouth daily.      . Vitamin D, Ergocalciferol, (DRISDOL) 50000 UNITS CAPS Take 50,000 Units by mouth every 7 (seven) days. Take Tuesday         Allergies  Penicillins  Electrocardiogram: 5/17 SR rate 63 LVH with lateral T wave changes  Assessment and Plan

## 2012-07-09 NOTE — Assessment & Plan Note (Signed)
Stable with no angina and good activity level.  Continue medical Rx  

## 2012-07-09 NOTE — Assessment & Plan Note (Signed)
Post CABG Now off amiodarone  Maint NSR

## 2012-07-09 NOTE — Assessment & Plan Note (Signed)
Has not smoked since CABG.  Congratulated him on this

## 2012-07-09 NOTE — Assessment & Plan Note (Signed)
Well controlled.  Continue current medications and low sodium Dash type diet.    

## 2012-07-09 NOTE — Patient Instructions (Addendum)
Your physician has requested that you have a lower  extremity venous duplex. This test is an ultrasound of the veins in the legs or arms. It looks at venous blood flow that carries blood from the heart to the legs or arms. Allow one hour for a Lower Venous exam. Allow thirty minutes for an Upper Venous exam. There are no restrictions or special instructions.  Your physician wants you to follow-up in: 6 MONTHS  You will receive a reminder letter in the mail two months in advance. If you don't receive a letter, please call our office to schedule the follow-up appointment.

## 2012-07-14 ENCOUNTER — Encounter: Payer: 59 | Admitting: Physical Therapy

## 2012-07-16 ENCOUNTER — Ambulatory Visit: Payer: 59 | Admitting: Physical Therapy

## 2012-07-20 ENCOUNTER — Encounter (INDEPENDENT_AMBULATORY_CARE_PROVIDER_SITE_OTHER): Payer: 59

## 2012-07-20 DIAGNOSIS — R600 Localized edema: Secondary | ICD-10-CM

## 2012-07-20 DIAGNOSIS — R609 Edema, unspecified: Secondary | ICD-10-CM

## 2012-07-20 DIAGNOSIS — M79609 Pain in unspecified limb: Secondary | ICD-10-CM

## 2012-07-20 DIAGNOSIS — M79669 Pain in unspecified lower leg: Secondary | ICD-10-CM

## 2012-07-20 DIAGNOSIS — Z951 Presence of aortocoronary bypass graft: Secondary | ICD-10-CM

## 2012-07-27 ENCOUNTER — Ambulatory Visit: Payer: 59 | Admitting: Physical Therapy

## 2012-07-29 ENCOUNTER — Ambulatory Visit: Payer: 59 | Admitting: Physical Therapy

## 2012-07-29 ENCOUNTER — Other Ambulatory Visit: Payer: Self-pay | Admitting: *Deleted

## 2012-07-29 DIAGNOSIS — I8290 Acute embolism and thrombosis of unspecified vein: Secondary | ICD-10-CM

## 2012-08-05 ENCOUNTER — Ambulatory Visit: Payer: 59 | Attending: Thoracic Surgery (Cardiothoracic Vascular Surgery) | Admitting: Physical Therapy

## 2012-08-05 DIAGNOSIS — IMO0001 Reserved for inherently not codable concepts without codable children: Secondary | ICD-10-CM | POA: Insufficient documentation

## 2012-08-05 DIAGNOSIS — M25669 Stiffness of unspecified knee, not elsewhere classified: Secondary | ICD-10-CM | POA: Insufficient documentation

## 2012-08-10 ENCOUNTER — Ambulatory Visit: Payer: 59 | Admitting: Physical Therapy

## 2012-08-12 ENCOUNTER — Ambulatory Visit: Payer: 59 | Admitting: Physical Therapy

## 2012-09-10 ENCOUNTER — Encounter (HOSPITAL_COMMUNITY)
Admission: RE | Admit: 2012-09-10 | Discharge: 2012-09-10 | Disposition: A | Discharge status: Home / Self Care | Payer: 59 | Source: Ambulatory Visit | Attending: Cardiovascular Disease | Admitting: Cardiovascular Disease

## 2012-09-10 DIAGNOSIS — N2581 Secondary hyperparathyroidism of renal origin: Secondary | ICD-10-CM | POA: Insufficient documentation

## 2012-09-10 DIAGNOSIS — N186 End stage renal disease: Secondary | ICD-10-CM | POA: Insufficient documentation

## 2012-09-10 DIAGNOSIS — E785 Hyperlipidemia, unspecified: Secondary | ICD-10-CM | POA: Insufficient documentation

## 2012-09-10 DIAGNOSIS — F17200 Nicotine dependence, unspecified, uncomplicated: Secondary | ICD-10-CM | POA: Insufficient documentation

## 2012-09-10 DIAGNOSIS — I2510 Atherosclerotic heart disease of native coronary artery without angina pectoris: Secondary | ICD-10-CM | POA: Insufficient documentation

## 2012-09-10 DIAGNOSIS — Z8673 Personal history of transient ischemic attack (TIA), and cerebral infarction without residual deficits: Secondary | ICD-10-CM | POA: Insufficient documentation

## 2012-09-10 DIAGNOSIS — Z951 Presence of aortocoronary bypass graft: Secondary | ICD-10-CM | POA: Insufficient documentation

## 2012-09-10 DIAGNOSIS — Z5189 Encounter for other specified aftercare: Secondary | ICD-10-CM | POA: Insufficient documentation

## 2012-09-10 DIAGNOSIS — D631 Anemia in chronic kidney disease: Secondary | ICD-10-CM | POA: Insufficient documentation

## 2012-09-10 DIAGNOSIS — I120 Hypertensive chronic kidney disease with stage 5 chronic kidney disease or end stage renal disease: Secondary | ICD-10-CM | POA: Insufficient documentation

## 2012-09-10 DIAGNOSIS — Z992 Dependence on renal dialysis: Secondary | ICD-10-CM | POA: Insufficient documentation

## 2012-09-10 NOTE — Progress Notes (Signed)
Cardiac Rehab Medication Review by a Pharmacist  Does the patient  feel that his/her medications are working for him/her?  yes  Has the patient been experiencing any side effects to the medications prescribed?  no  Does the patient measure his/her own blood pressure or blood glucose at home?  no , has cuff availability  Does the patient have any problems obtaining medications due to transportation or finances?   no  Understanding of regimen: good Understanding of indications: good Potential of compliance: good  Pharmacist comments: Patients medication list reviewed for accuracy, indications, adverse effects, and compliance. Added any new medications, made adjustments to other medications. Any patient questions were addressed at this time.  Abran Duke, PharmD Clinical Pharmacist Phone: [phone] Pager: [phone] 09/10/2012 9:02 AM

## 2012-09-14 ENCOUNTER — Telehealth: Payer: Self-pay | Admitting: Cardiovascular Disease

## 2012-09-14 ENCOUNTER — Encounter (HOSPITAL_COMMUNITY)
Admission: RE | Admit: 2012-09-14 | Discharge: 2012-09-14 | Disposition: A | Discharge status: Home / Self Care | Payer: 59 | Source: Ambulatory Visit | Attending: Cardiovascular Disease | Admitting: Cardiovascular Disease

## 2012-09-14 NOTE — Telephone Encounter (Signed)
Ok to continue rehab

## 2012-09-14 NOTE — Telephone Encounter (Signed)
[name] FROM [facility] CALLED PT HAD INVERTED T WAVES ON EKG HAS HAD IN PAST ALSO PAC'S AND PVC'S NOTED , PT ASYMPTOMATIC  WILL FAX EKG STRIPS   FOR REVIEW .Zack Seal

## 2012-09-14 NOTE — Progress Notes (Signed)
Pt started cardiac rehab today.  Pt tolerated light exercise without difficulty. Telemetry Sinus Rhythm with occasional PAC and occasional PVC. t wave inversion.  Patient asymptomatic, Vital signs stable. Will continue to monitor the patient throughout  the program. Will fax exercise flow sheets to Dr. Fabio Bering office for review with ECG tracings.

## 2012-09-14 NOTE — Telephone Encounter (Signed)
New Problem:    Called in wanting to speak with you in regards to the patient.  Please call back.

## 2012-09-15 NOTE — Telephone Encounter (Signed)
NOTED./CY PHONE NOTE FAXED TO  [facility]./CY

## 2012-09-16 ENCOUNTER — Encounter (HOSPITAL_COMMUNITY)
Admission: RE | Admit: 2012-09-16 | Discharge: 2012-09-16 | Disposition: A | Discharge status: Home / Self Care | Payer: 59 | Source: Ambulatory Visit | Attending: Cardiovascular Disease | Admitting: Cardiovascular Disease

## 2012-09-18 ENCOUNTER — Encounter (HOSPITAL_COMMUNITY)
Admission: RE | Admit: 2012-09-18 | Discharge: 2012-09-18 | Disposition: A | Discharge status: Home / Self Care | Payer: 59 | Source: Ambulatory Visit | Attending: Cardiovascular Disease | Admitting: Cardiovascular Disease

## 2012-09-21 ENCOUNTER — Encounter (HOSPITAL_COMMUNITY)
Admission: RE | Admit: 2012-09-21 | Discharge: 2012-09-21 | Disposition: A | Discharge status: Home / Self Care | Payer: 59 | Source: Ambulatory Visit | Attending: Cardiovascular Disease | Admitting: Cardiovascular Disease

## 2012-09-21 NOTE — Progress Notes (Signed)
Reviewed home exercise with pt today.  Pt plans to walk and use gym at work for exercise.  Reviewed THR, pulse, RPE, sign and symptoms, and when to call 911 or MD.  Pt voiced understanding. Fabio Pierce, MA, ACSM RCEP

## 2012-09-23 ENCOUNTER — Encounter (HOSPITAL_COMMUNITY)
Admission: RE | Admit: 2012-09-23 | Discharge: 2012-09-23 | Disposition: A | Discharge status: Home / Self Care | Payer: 59 | Source: Ambulatory Visit | Attending: Cardiovascular Disease | Admitting: Cardiovascular Disease

## 2012-09-25 ENCOUNTER — Encounter (HOSPITAL_COMMUNITY)
Admission: RE | Admit: 2012-09-25 | Discharge: 2012-09-25 | Disposition: A | Discharge status: Home / Self Care | Payer: 59 | Source: Ambulatory Visit | Attending: Cardiovascular Disease | Admitting: Cardiovascular Disease

## 2012-09-28 ENCOUNTER — Encounter (HOSPITAL_COMMUNITY)
Admission: RE | Admit: 2012-09-28 | Discharge: 2012-09-28 | Disposition: A | Discharge status: Home / Self Care | Payer: 59 | Source: Ambulatory Visit | Attending: Cardiovascular Disease | Admitting: Cardiovascular Disease

## 2012-09-28 NOTE — Progress Notes (Signed)
[name] 51 y.o. male Nutrition Note Spoke with pt.  Nutrition Plan, Nutrition Survey, and cholesterol goals reviewed with pt. Pt is following Step 1 of the Therapeutic Lifestyle Changes diet. Pt ability to adhere to a heart healthy diet is limited due to pre-renal transplant status. Per discussion, pt has been instructed by his nephrologist re: low phosphorous diet. Pt drinks 16 ounces of orange juice daily. Per pt, "I tried to stop drinking the juice and my potassium dropped. So, the doctor told me to continue drinking orange juice for now." Pt denies fluid restriction "for now." According to pt food record, pt is drinking a lot of calories and sugar via juice and 12 ounces of Sprite daily. Pt not willing to give up regular Sprite at this time. Pt states he wants to lose wt. Wt loss tips briefly reviewed.  Pt expressed understanding of the information reviewed. Pt aware of nutrition education classes offered and is unable to attend nutrition classes "due to being at work."  Nutrition Diagnosis   Food-and nutrition-related knowledge deficit related to lack of exposure to information as related to diagnosis of: ? CVD    Obesity related to excessive energy intake as evidenced by a BMI of 30.6  Nutrition RX/ Estimated Daily Nutrition Needs for: wt loss  1600-2100 Kcal, 40-55 gm fat, 10-14 gm sat fat, 1.5-2.1 gm trans-fat, <1500 mg sodium   Nutrition Intervention   Pt's individual nutrition plan including cholesterol goals reviewed with pt.   Benefits of adopting Therapeutic Lifestyle Changes discussed when Medficts reviewed.   Pt to attend the Portion Distortion class   Pt given handouts for: ? Nutrition I class ? Nutrition II class    Continue client-centered nutrition education by RD, as part of interdisciplinary care. Goal(s)   Pt to identify and limit food sources of saturated fat, trans fat, and cholesterol   Pt to identify food quantities necessary to achieve: ? wt loss to a goal wt of  172-184 lb (78.5-83.9 kg) at graduation from cardiac rehab.  Monitor and Evaluate progress toward nutrition goal with team. Nutrition Risk: Change to Moderate

## 2012-09-30 ENCOUNTER — Encounter (HOSPITAL_COMMUNITY)
Admission: RE | Admit: 2012-09-30 | Discharge: 2012-09-30 | Disposition: A | Discharge status: Home / Self Care | Payer: 59 | Source: Ambulatory Visit | Attending: Cardiovascular Disease | Admitting: Cardiovascular Disease

## 2012-10-02 ENCOUNTER — Encounter (HOSPITAL_COMMUNITY)
Admission: RE | Admit: 2012-10-02 | Discharge: 2012-10-02 | Disposition: A | Discharge status: Home / Self Care | Payer: 59 | Source: Ambulatory Visit | Attending: Cardiovascular Disease | Admitting: Cardiovascular Disease

## 2012-10-02 DIAGNOSIS — N186 End stage renal disease: Secondary | ICD-10-CM | POA: Insufficient documentation

## 2012-10-02 DIAGNOSIS — I2510 Atherosclerotic heart disease of native coronary artery without angina pectoris: Secondary | ICD-10-CM | POA: Insufficient documentation

## 2012-10-02 DIAGNOSIS — N2581 Secondary hyperparathyroidism of renal origin: Secondary | ICD-10-CM | POA: Insufficient documentation

## 2012-10-02 DIAGNOSIS — Z951 Presence of aortocoronary bypass graft: Secondary | ICD-10-CM | POA: Insufficient documentation

## 2012-10-02 DIAGNOSIS — I120 Hypertensive chronic kidney disease with stage 5 chronic kidney disease or end stage renal disease: Secondary | ICD-10-CM | POA: Insufficient documentation

## 2012-10-02 DIAGNOSIS — N039 Chronic nephritic syndrome with unspecified morphologic changes: Secondary | ICD-10-CM | POA: Insufficient documentation

## 2012-10-02 DIAGNOSIS — Z5189 Encounter for other specified aftercare: Secondary | ICD-10-CM | POA: Insufficient documentation

## 2012-10-02 DIAGNOSIS — E785 Hyperlipidemia, unspecified: Secondary | ICD-10-CM | POA: Insufficient documentation

## 2012-10-02 DIAGNOSIS — Z992 Dependence on renal dialysis: Secondary | ICD-10-CM | POA: Insufficient documentation

## 2012-10-02 DIAGNOSIS — D631 Anemia in chronic kidney disease: Secondary | ICD-10-CM | POA: Insufficient documentation

## 2012-10-02 DIAGNOSIS — Z8673 Personal history of transient ischemic attack (TIA), and cerebral infarction without residual deficits: Secondary | ICD-10-CM | POA: Insufficient documentation

## 2012-10-02 DIAGNOSIS — F17200 Nicotine dependence, unspecified, uncomplicated: Secondary | ICD-10-CM | POA: Insufficient documentation

## 2012-10-05 ENCOUNTER — Encounter (HOSPITAL_COMMUNITY)
Admission: RE | Admit: 2012-10-05 | Discharge: 2012-10-05 | Disposition: A | Discharge status: Home / Self Care | Payer: 59 | Source: Ambulatory Visit | Attending: Cardiovascular Disease | Admitting: Cardiovascular Disease

## 2012-10-07 ENCOUNTER — Encounter (HOSPITAL_COMMUNITY): Payer: 59

## 2012-10-09 ENCOUNTER — Encounter (HOSPITAL_COMMUNITY)
Admission: RE | Admit: 2012-10-09 | Discharge: 2012-10-09 | Disposition: A | Discharge status: Home / Self Care | Payer: 59 | Source: Ambulatory Visit | Attending: Cardiovascular Disease | Admitting: Cardiovascular Disease

## 2012-10-12 ENCOUNTER — Encounter (HOSPITAL_COMMUNITY): Payer: 59

## 2012-10-14 ENCOUNTER — Encounter (HOSPITAL_COMMUNITY)
Admission: RE | Admit: 2012-10-14 | Discharge: 2012-10-14 | Disposition: A | Discharge status: Home / Self Care | Payer: 59 | Source: Ambulatory Visit | Attending: Cardiovascular Disease | Admitting: Cardiovascular Disease

## 2012-10-16 ENCOUNTER — Encounter (HOSPITAL_COMMUNITY)
Admission: RE | Admit: 2012-10-16 | Discharge: 2012-10-16 | Disposition: A | Discharge status: Home / Self Care | Payer: 59 | Source: Ambulatory Visit | Attending: Cardiovascular Disease | Admitting: Cardiovascular Disease

## 2012-10-19 ENCOUNTER — Encounter (HOSPITAL_COMMUNITY): Payer: 59

## 2012-10-21 ENCOUNTER — Encounter: Payer: Self-pay | Admitting: Cardiovascular Disease

## 2012-10-21 ENCOUNTER — Encounter (HOSPITAL_COMMUNITY): Payer: 59

## 2012-10-23 ENCOUNTER — Encounter (HOSPITAL_COMMUNITY): Payer: 59

## 2012-10-26 ENCOUNTER — Encounter (INDEPENDENT_AMBULATORY_CARE_PROVIDER_SITE_OTHER): Payer: 59

## 2012-10-26 ENCOUNTER — Encounter (HOSPITAL_COMMUNITY): Payer: 59

## 2012-10-26 DIAGNOSIS — I8000 Phlebitis and thrombophlebitis of superficial vessels of unspecified lower extremity: Secondary | ICD-10-CM

## 2012-10-26 DIAGNOSIS — I8290 Acute embolism and thrombosis of unspecified vein: Secondary | ICD-10-CM

## 2012-10-27 ENCOUNTER — Inpatient Hospital Stay (HOSPITAL_COMMUNITY)
Admission: EM | Admit: 2012-10-27 | Discharge: 2012-10-28 | DRG: 280 | Disposition: A | Discharge status: Home / Self Care | Payer: 59 | Attending: Cardiology | Admitting: Cardiology

## 2012-10-27 ENCOUNTER — Encounter (HOSPITAL_COMMUNITY): Payer: Self-pay | Admitting: Emergency Medicine

## 2012-10-27 ENCOUNTER — Emergency Department (HOSPITAL_COMMUNITY): Payer: 59

## 2012-10-27 DIAGNOSIS — E213 Hyperparathyroidism, unspecified: Secondary | ICD-10-CM | POA: Diagnosis present

## 2012-10-27 DIAGNOSIS — I25810 Atherosclerosis of coronary artery bypass graft(s) without angina pectoris: Secondary | ICD-10-CM

## 2012-10-27 DIAGNOSIS — I495 Sick sinus syndrome: Secondary | ICD-10-CM | POA: Diagnosis present

## 2012-10-27 DIAGNOSIS — I214 Non-ST elevation (NSTEMI) myocardial infarction: Principal | ICD-10-CM | POA: Diagnosis present

## 2012-10-27 DIAGNOSIS — I2510 Atherosclerotic heart disease of native coronary artery without angina pectoris: Secondary | ICD-10-CM | POA: Diagnosis present

## 2012-10-27 DIAGNOSIS — I70209 Unspecified atherosclerosis of native arteries of extremities, unspecified extremity: Secondary | ICD-10-CM | POA: Diagnosis present

## 2012-10-27 DIAGNOSIS — N186 End stage renal disease: Secondary | ICD-10-CM | POA: Diagnosis present

## 2012-10-27 DIAGNOSIS — I120 Hypertensive chronic kidney disease with stage 5 chronic kidney disease or end stage renal disease: Secondary | ICD-10-CM | POA: Diagnosis present

## 2012-10-27 DIAGNOSIS — N183 Chronic kidney disease, stage 3 unspecified: Secondary | ICD-10-CM | POA: Diagnosis present

## 2012-10-27 DIAGNOSIS — M109 Gout, unspecified: Secondary | ICD-10-CM | POA: Diagnosis present

## 2012-10-27 DIAGNOSIS — Z8673 Personal history of transient ischemic attack (TIA), and cerebral infarction without residual deficits: Secondary | ICD-10-CM

## 2012-10-27 DIAGNOSIS — Z951 Presence of aortocoronary bypass graft: Secondary | ICD-10-CM

## 2012-10-27 DIAGNOSIS — R778 Other specified abnormalities of plasma proteins: Secondary | ICD-10-CM

## 2012-10-27 DIAGNOSIS — I639 Cerebral infarction, unspecified: Secondary | ICD-10-CM | POA: Diagnosis present

## 2012-10-27 DIAGNOSIS — R079 Chest pain, unspecified: Secondary | ICD-10-CM

## 2012-10-27 DIAGNOSIS — I4892 Unspecified atrial flutter: Secondary | ICD-10-CM | POA: Diagnosis present

## 2012-10-27 DIAGNOSIS — I480 Paroxysmal atrial fibrillation: Secondary | ICD-10-CM | POA: Diagnosis present

## 2012-10-27 DIAGNOSIS — Z880 Allergy status to penicillin: Secondary | ICD-10-CM

## 2012-10-27 DIAGNOSIS — Z87891 Personal history of nicotine dependence: Secondary | ICD-10-CM

## 2012-10-27 DIAGNOSIS — N189 Chronic kidney disease, unspecified: Secondary | ICD-10-CM

## 2012-10-27 HISTORY — DX: Paroxysmal atrial fibrillation: I48.0

## 2012-10-27 HISTORY — DX: Acute embolism and thrombosis of other specified veins: I82.890

## 2012-10-27 HISTORY — DX: Sick sinus syndrome: I49.5

## 2012-10-27 LAB — CBC WITH DIFFERENTIAL/PLATELET
Eosinophils Relative: 4 % (ref 0–5)
HCT: 37.4 % — ABNORMAL LOW (ref 39.0–52.0)
Hemoglobin: 12.5 g/dL — ABNORMAL LOW (ref 13.0–17.0)
Lymphocytes Relative: 17 % (ref 12–46)
Lymphs Abs: 1.3 10*3/uL (ref 0.7–4.0)
MCV: 85.8 fL (ref 78.0–100.0)
Monocytes Absolute: 0.5 10*3/uL (ref 0.1–1.0)
Monocytes Relative: 6 % (ref 3–12)
Neutro Abs: 5.7 10*3/uL (ref 1.7–7.7)
RDW: 14.5 % (ref 11.5–15.5)
WBC: 7.8 10*3/uL (ref 4.0–10.5)

## 2012-10-27 LAB — COMPREHENSIVE METABOLIC PANEL
AST: 30 U/L (ref 0–37)
BUN: 148 mg/dL — ABNORMAL HIGH (ref 6–23)
CO2: 19 mEq/L (ref 19–32)
Calcium: 9.4 mg/dL (ref 8.4–10.5)
Chloride: 100 mEq/L (ref 96–112)
Creatinine, Ser: 11.2 mg/dL — ABNORMAL HIGH (ref 0.50–1.35)
GFR calc Af Amer: 5 mL/min — ABNORMAL LOW (ref >90)
GFR calc non Af Amer: 5 mL/min — ABNORMAL LOW (ref >90)
Glucose, Bld: 132 mg/dL — ABNORMAL HIGH (ref 70–99)
Total Bilirubin: 0.3 mg/dL (ref 0.3–1.2)

## 2012-10-27 LAB — CBC
HCT: 34.9 % — ABNORMAL LOW (ref 39.0–52.0)
Hemoglobin: 11.5 g/dL — ABNORMAL LOW (ref 13.0–17.0)
MCH: 28.4 pg (ref 26.0–34.0)
MCV: 86.2 fL (ref 78.0–100.0)
RBC: 4.05 MIL/uL — ABNORMAL LOW (ref 4.22–5.81)

## 2012-10-27 LAB — PROTIME-INR
INR: 1.04 (ref 0.00–1.49)
Prothrombin Time: 13.5 seconds (ref 11.6–15.2)

## 2012-10-27 LAB — APTT: aPTT: 32 seconds (ref 24–37)

## 2012-10-27 LAB — POCT I-STAT TROPONIN I: Troponin i, poc: 0.25 ng/mL (ref 0.00–0.08)

## 2012-10-27 MED ORDER — SODIUM CHLORIDE 0.9 % IV SOLN: 250.0000 mL | INTRAVENOUS | Status: DC | PRN

## 2012-10-27 MED ORDER — DILTIAZEM HCL 100 MG IV SOLR
5.0000 mg/h | INTRAVENOUS | Status: DC
  Administered 2012-10-27: 5 mg/h via INTRAVENOUS
  Filled 2012-10-27: qty 100

## 2012-10-27 MED ORDER — CLONIDINE HCL 0.2 MG PO TABS: 0.2000 mg | ORAL_TABLET | Freq: Every day | ORAL | Status: DC

## 2012-10-27 MED ORDER — SODIUM CHLORIDE 0.9 % IV SOLN
1000.0000 mL | INTRAVENOUS | Status: DC
  Administered 2012-10-27: 1000 mL via INTRAVENOUS

## 2012-10-27 MED ORDER — OMEGA-3-ACID ETHYL ESTERS 1 G PO CAPS
2.0000 g | ORAL_CAPSULE | Freq: Two times a day (BID) | ORAL | Status: DC
  Administered 2012-10-27 – 2012-10-28 (×2): 2 g via ORAL
  Filled 2012-10-27 (×3): qty 2

## 2012-10-27 MED ORDER — SODIUM CHLORIDE 0.9 % IJ SOLN: 3.0000 mL | Freq: Two times a day (BID) | INTRAMUSCULAR | Status: DC

## 2012-10-27 MED ORDER — ASPIRIN EC 81 MG PO TBEC
81.0000 mg | DELAYED_RELEASE_TABLET | Freq: Every day | ORAL | Status: DC
  Administered 2012-10-28: 81 mg via ORAL
  Filled 2012-10-27: qty 1

## 2012-10-27 MED ORDER — WARFARIN - PHARMACIST DOSING INPATIENT: Freq: Every day | Status: DC

## 2012-10-27 MED ORDER — VITAMIN D (ERGOCALCIFEROL) 1.25 MG (50000 UNIT) PO CAPS: 50000.0000 [IU] | ORAL_CAPSULE | ORAL | Status: DC

## 2012-10-27 MED ORDER — ASPIRIN 81 MG PO CHEW: 324.0000 mg | CHEWABLE_TABLET | Freq: Once | ORAL | Status: DC

## 2012-10-27 MED ORDER — FUROSEMIDE 80 MG PO TABS
160.0000 mg | ORAL_TABLET | Freq: Every day | ORAL | Status: DC
  Administered 2012-10-28: 160 mg via ORAL
  Filled 2012-10-27: qty 2

## 2012-10-27 MED ORDER — WARFARIN SODIUM 10 MG PO TABS
10.0000 mg | ORAL_TABLET | Freq: Once | ORAL | Status: AC
  Administered 2012-10-27: 10 mg via ORAL
  Filled 2012-10-27: qty 1

## 2012-10-27 MED ORDER — ALLOPURINOL 100 MG PO TABS
100.0000 mg | ORAL_TABLET | Freq: Two times a day (BID) | ORAL | Status: DC
  Administered 2012-10-27 – 2012-10-28 (×2): 100 mg via ORAL
  Filled 2012-10-27 (×3): qty 1

## 2012-10-27 MED ORDER — CALCIUM CARBONATE ANTACID 500 MG PO CHEW
3.0000 | CHEWABLE_TABLET | Freq: Three times a day (TID) | ORAL | Status: DC
  Administered 2012-10-27 – 2012-10-28 (×3): 600 mg via ORAL
  Filled 2012-10-27 (×5): qty 3

## 2012-10-27 MED ORDER — CALCITRIOL 0.25 MCG PO CAPS
0.2500 ug | ORAL_CAPSULE | Freq: Every day | ORAL | Status: DC
Start: 2012-10-28 — End: 2012-10-28
  Administered 2012-10-28: 0.25 ug via ORAL
  Filled 2012-10-27: qty 1

## 2012-10-27 MED ORDER — SODIUM CHLORIDE 0.9 % IJ SOLN: 3.0000 mL | INTRAMUSCULAR | Status: DC | PRN

## 2012-10-27 MED ORDER — HEPARIN (PORCINE) IN NACL 100-0.45 UNIT/ML-% IJ SOLN
1400.0000 [IU]/h | INTRAMUSCULAR | Status: DC
  Administered 2012-10-27: 1200 [IU]/h via INTRAVENOUS
  Administered 2012-10-28 (×2): 1400 [IU]/h via INTRAVENOUS
  Filled 2012-10-27 (×2): qty 250

## 2012-10-27 MED ORDER — ACETAMINOPHEN 325 MG PO TABS: 650.0000 mg | ORAL_TABLET | ORAL | Status: DC | PRN

## 2012-10-27 MED ORDER — NISOLDIPINE ER 17 MG PO TB24
17.0000 mg | ORAL_TABLET | Freq: Two times a day (BID) | ORAL | Status: DC
  Administered 2012-10-27 – 2012-10-28 (×2): 17 mg via ORAL
  Filled 2012-10-27 (×3): qty 1

## 2012-10-27 MED ORDER — SIMVASTATIN 40 MG PO TABS
40.0000 mg | ORAL_TABLET | Freq: Every day | ORAL | Status: DC
  Administered 2012-10-28: 40 mg via ORAL
  Filled 2012-10-27: qty 1

## 2012-10-27 MED ORDER — METOPROLOL TARTRATE 50 MG PO TABS
50.0000 mg | ORAL_TABLET | Freq: Three times a day (TID) | ORAL | Status: DC
  Administered 2012-10-27 – 2012-10-28 (×2): 50 mg via ORAL
  Filled 2012-10-27 (×5): qty 1

## 2012-10-27 MED ORDER — HEPARIN BOLUS VIA INFUSION
1500.0000 [IU] | Freq: Once | INTRAVENOUS | Status: AC
  Administered 2012-10-28: 1500 [IU] via INTRAVENOUS
  Filled 2012-10-27: qty 1500

## 2012-10-27 MED ORDER — NITROGLYCERIN 0.4 MG SL SUBL: 0.4000 mg | SUBLINGUAL_TABLET | SUBLINGUAL | Status: DC | PRN

## 2012-10-27 MED ORDER — ASPIRIN 81 MG PO CHEW
324.0000 mg | CHEWABLE_TABLET | Freq: Once | ORAL | Status: AC
  Administered 2012-10-27: 324 mg via ORAL
  Filled 2012-10-27: qty 4

## 2012-10-27 MED ORDER — HEPARIN BOLUS VIA INFUSION
4000.0000 [IU] | Freq: Once | INTRAVENOUS | Status: AC
  Administered 2012-10-27: 4000 [IU] via INTRAVENOUS

## 2012-10-27 MED ORDER — FUROSEMIDE 80 MG PO TABS
80.0000 mg | ORAL_TABLET | Freq: Every evening | ORAL | Status: DC
  Filled 2012-10-27 (×2): qty 1

## 2012-10-27 MED ORDER — FENOFIBRATE 160 MG PO TABS
160.0000 mg | ORAL_TABLET | Freq: Every day | ORAL | Status: DC
  Administered 2012-10-28: 160 mg via ORAL
  Filled 2012-10-27: qty 1

## 2012-10-27 NOTE — ED Provider Notes (Signed)
History     CSN: [id number] Arrival date & time 10/27/12  1021 First MD Initiated Contact with Patient 10/27/12 1038      Chief Complaint  Patient presents with  . Chest Pain     HPI Patient presents to the emergency room with complaints of chest pain and shortness of breath that started this morning when he woke up some around 8:00.  He also noticed that he was having palpitations. Patient states the pressure discomfort just hurts. At about a 7/10. Nothing seems to make it better or worse. He did take his morning medications this morning and has noticed that he is feeling somewhat better as time progresses. He does have history of atrial fibrillation following his bypass this year. Otherwise he does not generally have any trouble with his heart rhythm. Past Medical History  Diagnosis Date  . Hypertension   . CAD (coronary artery disease)   . Elevated cholesterol   . Ventricular hypertrophy   . Gout   . Pharyngitis   . Peripheral vascular disease   . Heart murmur   . Migraines     "last one 1990's"  . Stroke 1995; 2001    denies residual  . 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 Cellulitis 04/13/2012    Right lower extremity saphenous vein harvest incision  . Hyperparathyroidism   . Chronic kidney disease (CKD), stage IV (severe)     "not getting dialysis yet" (04/13/12)    Past Surgical History  Procedure Date  . Cardiac catheterization 03/16/12  . Renal artery stent 2001  . Fracture surgery   . Av fistula placement 2011    left forearm  . Elbow fracture surgery 1972    left  . Hernia repair 09/2009    umbilical  . Inguinal hernia repair 09/2009    bilaterally  . Dental surgery 2008    multiple  . Coronary artery bypass graft 03/19/2012    Procedure: CORONARY ARTERY BYPASS GRAFTING (CABG);  Surgeon: [name], MD;  Location: [facility] OR;  Service: Open Heart Surgery;   Laterality: N/A;  (B) MAMMARY    No family history on file.  History  Substance Use Topics  . Smoking status: Former Smoker -- 0.3 packs/day for 30 years    Types: Cigarettes    Quit date: 03/15/2012  . Smokeless tobacco: Never Used  . Alcohol Use: Yes     Comment: 04/13/12 "2 mixed drinks/month"      Review of Systems  All other systems reviewed and are negative.    Allergies  Penicillins  Home Medications   Current Outpatient Rx  Name  Route  Sig  Dispense  Refill  . ALLOPURINOL 100 MG PO TABS   Oral   Take 100 mg by mouth 2 (two) times daily.          . ASPIRIN 325 MG PO TBEC   Oral   Take 1 tablet (325 mg total) by mouth daily.   30 tablet      . CALCITRIOL 0.25 MCG PO CAPS   Oral   Take 0.25 mcg by mouth daily.          Marland Kitchen CALCIUM CARBONATE ANTACID 750 MG PO CHEW   Oral   Chew 2 tablets by mouth 3 (three) times daily. With meals         .  CLONIDINE HCL 0.2 MG PO TABS   Oral   Take 0.2 mg by mouth daily.          Marland Kitchen CLOPIDOGREL BISULFATE 75 MG PO TABS   Oral   Take 75 mg by mouth daily. Stopped on 4-10 for procedure         . FENOFIBRATE MICRONIZED 134 MG PO CAPS   Oral   Take 134 mg by mouth daily before breakfast.         . FUROSEMIDE 80 MG PO TABS   Oral   Take 80-160 mg by mouth 2 (two) times daily. Patient takes 160 mg in the AM and 80 mg in the PM         . METOPROLOL TARTRATE 50 MG PO TABS   Oral   Take 50 mg by mouth 2 (two) times daily.         Marland Kitchen NISOLDIPINE ER 20 MG PO TB24   Oral   Take 20 mg by mouth 2 (two) times daily.         . OMEGA-3-ACID ETHYL ESTERS 1 G PO CAPS   Oral   Take 2 g by mouth 2 (two) times daily.         Marland Kitchen SIMVASTATIN 40 MG PO TABS   Oral   Take 40 mg by mouth daily.         Marland Kitchen VITAMIN D (ERGOCALCIFEROL) 50000 UNITS PO CAPS   Oral   Take 50,000 Units by mouth every 7 (seven) days. Take Tuesday            BP 131/98  Pulse 133  Temp 97.6 F (36.4 C) (Oral)  Resp 18  SpO2  98%  Physical Exam  Nursing note and vitals reviewed. Constitutional: He appears well-developed and well-nourished. No distress.  HENT:  Head: Normocephalic and atraumatic.  Right Ear: External ear normal.  Left Ear: External ear normal.  Eyes: Conjunctivae normal are normal. Right eye exhibits no discharge. Left eye exhibits no discharge. No scleral icterus.  Neck: Neck supple. No tracheal deviation present.  Cardiovascular: Regular rhythm and intact distal pulses.  Tachycardia present.   Pulmonary/Chest: Effort normal and breath sounds normal. No stridor. No respiratory distress. He has no wheezes. He has no rales.  Abdominal: Soft. Bowel sounds are normal. He exhibits no distension. There is no tenderness. There is no rebound and no guarding.  Musculoskeletal: He exhibits no edema and no tenderness.  Neurological: He is alert. He has normal strength. No sensory deficit. Cranial nerve deficit:  no gross defecits noted. He exhibits normal muscle tone. He displays no seizure activity. Coordination normal.  Skin: Skin is warm and dry. No rash noted.  Psychiatric: He has a normal mood and affect.    ED Course  Procedures (including critical care time)  Rate: 135  Rhythm: atrial flutter with 2:1 block   QRS Axis: normal  Intervals: normal  ST/T Wave abnormalities: inverted t waves laterally  Conduction Disutrbances:none  Narrative Interpretation: LVH,   Old EKG Reviewed: rate faster, t wave changes noted on prior ecg EDMeds cardizem infusion, asa Good response.  Symptoms improving  CRITICAL CARE Performed by: Linwood Dibbles R Total critical care time: 40Critical care time was exclusive of separately billable procedures and treating other patients. Critical care was necessary to treat or prevent imminent or life-threatening deterioration. Critical care was time spent personally by me on the following activities: development of treatment plan with patient and/or surrogate as well as  nursing, discussions  with consultants, evaluation of patient's response to treatment, examination of patient, obtaining history from patient or surrogate, ordering and performing treatments and interventions, ordering and review of laboratory studies, ordering and review of radiographic studies, pulse oximetry and re-evaluation of patient's condition.  Labs Reviewed  CBC WITH DIFFERENTIAL - Abnormal; Notable for the following:    Hemoglobin 12.5 (*)     HCT 37.4 (*)     All other components within normal limits  COMPREHENSIVE METABOLIC PANEL - Abnormal; Notable for the following:    Glucose, Bld 132 (*)     BUN 148 (*)     Creatinine, Ser 11.20 (*)     Albumin 2.8 (*)     GFR calc non Af Amer 5 (*)     GFR calc Af Amer 5 (*)     All other components within normal limits  POCT I-STAT TROPONIN I - Abnormal; Notable for the following:    Troponin i, poc 0.21 (*)     All other components within normal limits  PROTIME-INR  APTT   Dg Chest Portable 1 View  10/27/2012  *RADIOLOGY REPORT*  Clinical history:  Chest pain  PORTABLE CHEST - 1 VIEW  Comparison: Apr 10, 2012  Findings: Lungs clear.  Heart is mildly enlarged with normal pulmonary vascularity.  The patient is status post coronary artery bypass grafting.  No adenopathy.  No bone lesions.  IMPRESSION: Mild cardiac enlargement.  No edema or consolidation.   Original Report Authenticated By: Bretta Bang, M.D.     1. Atrial flutter   2. Chest pain   3. Elevated troponin   4. Chronic renal failure     MDM  The patient appears to have atrial flutter associated with tachycardia. He's been treated with a Cardizem drip in the emergency department. His rate has decreased into the 80s and he is feeling much better. He does have history of coronary artery disease and his troponin is slightly elevated.  Is possible this could be related to his chronic renal failure rather than any acute cardiac ischemia. I will consult with his cardiologist  regarding further treatment.        Celene Kras, MD 10/27/12 [phone]

## 2012-10-27 NOTE — ED Notes (Signed)
Cardiology at bedside.

## 2012-10-27 NOTE — Progress Notes (Signed)
ANTICOAGULATION CONSULT NOTE - Follow Up Consult  Pharmacy Consult for Heparin and Coumadin Indication: chest pain/ACS and atrial fibrillation  Allergies  Allergen Reactions  . Penicillins Itching and Rash    Patient Measurements: Height: 5' 7.32" (171 cm) Weight: 197 lb 1.5 oz (89.4 kg) IBW/kg (Calculated) : 66.84  Heparin Dosing Weight: 85.3 kg  Vital Signs: Temp: 98 F (36.7 C) (11/26 2100) Temp src: Oral (11/26 2100) BP: 145/78 mmHg (11/26 2100) Pulse Rate: 59  (11/26 2100)  Labs:  Basename 10/27/12 2230 10/27/12 1702 10/27/12 1052 10/27/12 1045  HGB -- 11.5* -- 12.5*  HCT -- 34.9* -- 37.4*  PLT -- 167 -- 199  APTT -- -- 32 --  LABPROT -- -- 13.5 --  INR -- -- 1.04 --  HEPARINUNFRC 0.25* -- -- --  CREATININE -- -- -- 11.20*  CKTOTAL -- -- -- --  CKMB -- -- -- --  TROPONINI -- 2.65* -- --    Estimated Creatinine Clearance: 8.6 ml/min (by C-G formula based on Cr of 11.2).   Medical History: Past Medical History  Diagnosis Date  . Hypertension   . CAD (coronary artery disease)     s/p CABG 03/2012: LIMA to LAD, free RIMA to OM1, SVG to D1, sequential SVG to AM and LPLB2, EVH via right thigh and leg.  . Elevated cholesterol   . Ventricular hypertrophy   . Gout   . Peripheral vascular disease   . Heart murmur   . Migraines     "last one 1990's"  . Stroke 1995; 2001    denies residual  . Cellulitis 04/13/2012    RLE saphenous vein harvest incision   . Hyperparathyroidism   . Superficial vein thrombosis     R greater saphenous 04/2012  . PAF (paroxysmal atrial fibrillation)     Occurred post-op CABG  . CKD (chronic kidney disease) stage 5, GFR less than 15 ml/min     Diagnosed age 50, awaiting transplant    Medications:  Scheduled:     . allopurinol  100 mg Oral BID  . [COMPLETED] aspirin  324 mg Oral Once  . aspirin EC  81 mg Oral Daily  . calcitRIOL  0.25 mcg Oral Daily  . calcium carbonate  3 tablet Oral TID WC  . cloNIDine  0.2 mg Oral Daily    . fenofibrate  160 mg Oral Daily  . furosemide  160 mg Oral Daily  . furosemide  80 mg Oral QPM  . [COMPLETED] heparin  4,000 Units Intravenous Once  . metoprolol  50 mg Oral TID  . nisoldipine  17 mg Oral BID  . omega-3 acid ethyl esters  2 g Oral BID  . simvastatin  40 mg Oral Daily  . sodium chloride  3 mL Intravenous Q12H  . Vitamin D (Ergocalciferol)  50,000 Units Oral Q7 days  . [COMPLETED] warfarin  10 mg Oral ONCE-1800  . Warfarin - Pharmacist Dosing Inpatient   Does not apply q1800  . [DISCONTINUED] aspirin  324 mg Oral Once    Assessment: 51 yr old male admitted with chest pain and atrial flutter. Pt has CKD and is awaiting transplant. He was started on heparin and warfarin. Heparin level (0.25) is below-goal on 1200 units/hr. No problem with line /infusion per RN.   Goal of Therapy:  Heparin level 0.3-0.7 units/ml Monitor platelets by anticoagulation protocol: Yes  INR 2.0-3.0  Plan:  1. Heparin IV bolus of 1500 units x 1, then increase IV infusion to 1400  units/hr.  2. Heparin level in 8 hours.   Emeline Gins 10/27/2012,11:28 PM

## 2012-10-27 NOTE — ED Notes (Signed)
Onset CP and SOB this am when patient woke up.  6-7/10 " it hurts" pain.
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
## 2012-10-27 NOTE — Progress Notes (Signed)
ANTICOAGULATION CONSULT NOTE - Initial Consult  Pharmacy Consult for Heparin and Coumadin Indication: chest pain/ACS and atrial fibrillation  Allergies  Allergen Reactions  . Penicillins Itching and Rash    Patient Measurements: Height: 5' 7.32" (171 cm) Weight: 197 lb 1.5 oz (89.4 kg) IBW/kg (Calculated) : 66.84  Heparin Dosing Weight: 85.3 kg  Vital Signs: Temp: 97.8 F (36.6 C) (11/26 1554) Temp src: Oral (11/26 1554) BP: 109/68 mmHg (11/26 1554) Pulse Rate: 51  (11/26 1445)  Labs:  Basename 10/27/12 1052 10/27/12 1045  HGB -- 12.5*  HCT -- 37.4*  PLT -- 199  APTT 32 --  LABPROT 13.5 --  INR 1.04 --  HEPARINUNFRC -- --  CREATININE -- 11.20*  CKTOTAL -- --  CKMB -- --  TROPONINI -- --    Estimated Creatinine Clearance: 8.6 ml/min (by C-G formula based on Cr of 11.2).   Medical History: Past Medical History  Diagnosis Date  . Hypertension   . CAD (coronary artery disease)     s/p CABG 03/2012: LIMA to LAD, free RIMA to OM1, SVG to D1, sequential SVG to AM and LPLB2, EVH via right thigh and leg.  . Elevated cholesterol   . Ventricular hypertrophy   . Gout   . Peripheral vascular disease   . Heart murmur   . Migraines     "last one 1990's"  . Stroke 1995; 2001    denies residual  . Cellulitis 04/13/2012    RLE saphenous vein harvest incision   . Hyperparathyroidism   . CKD (chronic kidney disease) stage 5, GFR less than 15 ml/min     Diagnosed age 44, awaiting transplant  . Superficial vein thrombosis     R greater saphenous 04/2012  . PAF (paroxysmal atrial fibrillation)     Occurred post-op CABG    Medications:  Scheduled:    . [COMPLETED] aspirin  324 mg Oral Once  . [DISCONTINUED] aspirin  324 mg Oral Once    Assessment: 51 yr old male admitted with chest pain and atrial flutter. Pt has severe CKD and is awaiting transplant. He received IV diltiazem in the ED With conversion to NSR. He is to be started on heparin and warfarin. Goal of  Therapy:  Heparin level 0.3-0.7 units/ml Monitor platelets by anticoagulation protocol: Yes  INR 2.0-3.0  Plan:  Will start heparin drip at 1200 units/hr with a 4000 unit bolus. Check heparin level and 6 hrs, then daily heparin level and CBC. Will give coumadin 10 mg tonight and check daily PT/INR. Ordered coumadin booklet and video for pt.  Eugene Garnet 10/27/2012,4:00 PM

## 2012-10-27 NOTE — Progress Notes (Signed)
CRITICAL VALUE ALERT  Critical value received:  Troponin I 2.65  Date of notification:  10/27/2012  Time of notification:  1810  Critical value read back: yes  Nurse who received alert:  Emelda Brothers, RN  MD notified (1st page):  yes  Time of first page:  1812  MD notified (2nd page):  Time of second page:  Responding MD:  Berton Mount, PA  Time MD responded:  [phone]

## 2012-10-27 NOTE — ED Notes (Signed)
Pt uses verbatim language states "get your fucking hands off my chest" during assesment.

## 2012-10-27 NOTE — ED Notes (Signed)
Changed rate from 10 mg/hr  to 15 mg/hr while in room after one minute patient heart rate decreased from 130 to 90. EKG taken.  EDP notified at bedside.

## 2012-10-27 NOTE — ED Notes (Signed)
Pt's HR 38-44.  MD Lynelle Doctor notified.  Cardizem stopped.  Primary RN aware and at bedside.

## 2012-10-27 NOTE — ED Notes (Signed)
Spoke with patient about second nurse care. Patient stated I had pain in my chest and didn't want any body to touch it. I do not have a problem with the nurse.

## 2012-10-27 NOTE — Progress Notes (Signed)
Notified pt had 8 beat run of V Tach. Upon assessment pts VSS, BP: 146/85, P:60, 96% on 2L [place]. Pt has no complaints of CP or SOB. Notified [place], [place]. Will continue to monitor pt closely.

## 2012-10-27 NOTE — ED Notes (Signed)
Results of troponin shown to primary nurse greg and MD Roselyn Bering

## 2012-10-28 ENCOUNTER — Encounter (HOSPITAL_COMMUNITY): Payer: 59

## 2012-10-28 ENCOUNTER — Encounter (HOSPITAL_COMMUNITY): Payer: Self-pay | Admitting: Pharmacy Technician

## 2012-10-28 ENCOUNTER — Encounter (HOSPITAL_COMMUNITY): Payer: Self-pay | Admitting: Internal Medicine

## 2012-10-28 DIAGNOSIS — I495 Sick sinus syndrome: Secondary | ICD-10-CM | POA: Diagnosis present

## 2012-10-28 DIAGNOSIS — I359 Nonrheumatic aortic valve disorder, unspecified: Secondary | ICD-10-CM

## 2012-10-28 DIAGNOSIS — I639 Cerebral infarction, unspecified: Secondary | ICD-10-CM | POA: Diagnosis present

## 2012-10-28 DIAGNOSIS — I214 Non-ST elevation (NSTEMI) myocardial infarction: Secondary | ICD-10-CM

## 2012-10-28 LAB — CBC
Hemoglobin: 11.5 g/dL — ABNORMAL LOW (ref 13.0–17.0)
MCHC: 33.3 g/dL (ref 30.0–36.0)
Platelets: 181 10*3/uL (ref 150–400)
RBC: 3.98 MIL/uL — ABNORMAL LOW (ref 4.22–5.81)

## 2012-10-28 LAB — BASIC METABOLIC PANEL
Calcium: 9.1 mg/dL (ref 8.4–10.5)
GFR calc Af Amer: 6 mL/min — ABNORMAL LOW (ref >90)
GFR calc non Af Amer: 5 mL/min — ABNORMAL LOW (ref >90)
Potassium: 3.5 mEq/L (ref 3.5–5.1)
Sodium: 139 mEq/L (ref 135–145)

## 2012-10-28 LAB — PROTIME-INR: INR: 1.27 (ref 0.00–1.49)

## 2012-10-28 LAB — TROPONIN I: Troponin I: 6.24 ng/mL (ref <0.30)

## 2012-10-28 LAB — HEMOGLOBIN A1C
Hgb A1c MFr Bld: 5.8 % — ABNORMAL HIGH (ref <5.7)
Mean Plasma Glucose: 120 mg/dL — ABNORMAL HIGH (ref <117)

## 2012-10-28 LAB — MAGNESIUM: Magnesium: 1.9 mg/dL (ref 1.5–2.5)

## 2012-10-28 MED ORDER — WARFARIN SODIUM 5 MG PO TABS: 5.0000 mg | ORAL_TABLET | Freq: Every day | ORAL | Status: DC

## 2012-10-28 MED ORDER — HEPARIN (PORCINE) IN NACL 100-0.45 UNIT/ML-% IJ SOLN
1650.0000 [IU]/h | INTRAMUSCULAR | Status: DC
  Filled 2012-10-28 (×2): qty 250

## 2012-10-28 MED ORDER — WARFARIN SODIUM 7.5 MG PO TABS
7.5000 mg | ORAL_TABLET | Freq: Once | ORAL | Status: DC
  Filled 2012-10-28: qty 1

## 2012-10-28 MED ORDER — CLONIDINE HCL 0.2 MG PO TABS
0.2000 mg | ORAL_TABLET | Freq: Every day | ORAL | Status: DC
  Administered 2012-10-28: 0.2 mg via ORAL
  Filled 2012-10-28 (×2): qty 1

## 2012-10-28 MED ORDER — NITROGLYCERIN 0.4 MG SL SUBL: 0.4000 mg | SUBLINGUAL_TABLET | SUBLINGUAL | Status: DC | PRN

## 2012-10-28 MED ORDER — ASPIRIN 81 MG PO TBEC: 81.0000 mg | DELAYED_RELEASE_TABLET | Freq: Every day | ORAL | Status: DC

## 2012-10-28 NOTE — Discharge Summary (Signed)
Patient ID: [name],  MRN: [id number], DOB/AGE: [birth year] 49 y.o.  Admit date: 10/27/2012 Discharge date: 10/28/2012  Primary Care Provider: Mickie Haley Primary Cardiologist: P. Eden Emms, MD/S. Graciela Husbands, MD (EP)  Discharge Diagnoses Principal Problem:  * Atrial Flutter with RVR  **Coumadin initiated.  **EP study with radiofrequency catheter ablation scheduled for 11/05/2012. Active Problems:  NSTEMI (non-ST elevated myocardial infarction)  **In setting of Atrial Flutter with RVR.  **Outpatient lexiscan cardiolite pending.  Sinus node dysfunction-post termination pause  **8 second pause following conversion from atrial flutter.  CAD (coronary artery disease) of artery bypass graft  HTN (hypertension)  CRF (chronic renal failure)  **Awaiting transplant.  Not on dialysis.  Stroke  Allergies Allergies  Allergen Reactions  . Penicillins Itching and Rash   Procedures  2D Echocardiogram 10/28/2012  Study Conclusions  - Left ventricle: The cavity size was normal. Wall thickness was increased in a pattern of mild LVH. Systolic function was normal. The estimated ejection fraction was in the range of 60% to 65%. - Aortic valve: Mild regurgitation. - Mitral valve: Mild regurgitation. - Left atrium: The atrium was moderately to severely dilated. - Right atrium: The atrium was mildly dilated. _____________  History of Present Illness  51 y/o male with the above problem list.  He was in his USOH until the morning of admission when he developed mild chest pressure associated with dyspnea and diaphoresis.  He presented to the [facility] ED where he was found to be in atrial flutter with a rapid ventricular response.  He was treated with IV diltiazem with initial rate improvement followed by conversion to sinus rhythm.  In the process of conversion, he had an 8.4 second pause.  Cardiology was called to evaluate and he was admitted for further evaluation.  Hospital Course  [name]  ruled in for NSTEMI, eventually peaking his troponin at 7.12.  He had neither additional atrial flutter nor chest pain.  He was maintained on asa, plavix, beta blocker, statin, and heparin.  It was felt that troponin elevation was most likely type II in nature, in the setting of atrial flutter.  An echocardiogram was performed this AM revealing normal LV function with moderately to severely dilated LA. We have arranged for an outpatient lexiscan cardiolite early next week.  With markedly elevated creatinine, pt is a poor candidate for repeat catheterization at this time.  With regard to his atrial flutter and post-conversion pauses, EP was consulted.  It was mutually agreed that in the setting of a CHADS2 of 3, that pt would benefit from oral anticoagulation and as such, coumadin has been started.  EP also felt that his risk of recurrent atrial flutter (and post-termination pauses) is >90% and that he would greatly benefit from catheter ablation.  As a result, we have arranged for discharge today with outpatient inr follow-up on 12/2, lexiscan cardiolite on 12/3, and EP study with catheter ablation on 12/5.  He will be discharged home today in good condition.   Discharge Vitals Blood pressure 144/83, pulse 60, temperature 97.7 F (36.5 C), temperature source Oral, resp. rate 18, height 5' 7.32" (1.71 m), weight 197 lb 1.5 oz (89.4 kg), SpO2 94.00%.  Filed Weights   10/27/12 1500  Weight: 197 lb 1.5 oz (89.4 kg)   Labs  CBC  Basename 10/28/12 0826 10/27/12 1702 10/27/12 1045  WBC 8.0 5.8 --  NEUTROABS -- -- 5.7  HGB 11.5* 11.5* --  HCT 34.5* 34.9* --  MCV 86.7 86.2 --  PLT  181 167 --   Basic Metabolic Panel  Basename 10/28/12 0826 10/27/12 1045  NA 139 138  K 3.5 3.7  CL 101 100  CO2 18* 19  GLUCOSE 161* 132*  BUN 141* 148*  CREATININE 10.82* 11.20*  CALCIUM 9.1 9.4  MG 1.9 --  PHOS -- --   Liver Function Tests  Basename 10/27/12 1045  AST 30  ALT 48  ALKPHOS 62  BILITOT 0.3   PROT 6.3  ALBUMIN 2.8*   Cardiac Enzymes  Basename 10/28/12 0830 10/27/12 2235 10/27/12 1702  CKTOTAL -- -- --  CKMB -- -- --  CKMBINDEX -- -- --  TROPONINI 6.24* 7.12* 2.65*   Hemoglobin A1C  Basename 10/27/12 1702  HGBA1C 5.8*   Thyroid Function Tests  Basename 10/27/12 1702  TSH 0.747  T4TOTAL --  T3FREE --  THYROIDAB --   Disposition  Pt is being discharged home today in good condition.  Follow-up Plans & Appointments  Follow-up Information    Follow up with Steven Manges, MD. On 12/09/2012. (10 AM)    Contact information:   [address] [place] [address] [phone]       Follow up with [facility]. On 11/02/2012. (10:30 AM)    Contact information:   [address] [place] [address] [phone]      Follow up with [facility] (Stress Test). On 11/03/2012. (11:30 AM - Pharmacologic stress test. Nothing to eat or drink after midnight.  No caffeine on 12/2.)    Contact information:   [address] [place] [address] [phone]      Follow up with [facility]. On 11/05/2012. (8:30 AM.  For Ablation.  Nothing to eat after midnight.)        Discharge Medications    Medication List     As of 10/28/2012  1:13 PM    STOP taking these medications         clopidogrel 75 MG tablet   Commonly known as: PLAVIX      TAKE these medications         allopurinol 100 MG tablet   Commonly known as: ZYLOPRIM   Take 100 mg by mouth 2 (two) times daily.      aspirin 81 MG EC tablet   Take 1 tablet (81 mg total) by mouth daily.      calcitRIOL 0.25 MCG capsule   Commonly known as: ROCALTROL   Take 0.25 mcg by mouth daily.      calcium carbonate 750 MG chewable tablet   Commonly known as: TUMS EX   Chew 2 tablets by mouth 3 (three) times daily. With meals      cloNIDine 0.2 MG tablet   Commonly known as: CATAPRES   Take 0.2 mg by mouth  daily.      fenofibrate micronized 134 MG capsule   Commonly known as: LOFIBRA   Take 134 mg by mouth daily before breakfast.      furosemide 80 MG tablet   Commonly known as: LASIX   Take 160 mg by mouth daily. Patient takes 160 mg in the AM and 80 mg in the PM      LOVAZA 1 G capsule   Generic drug: omega-3 acid ethyl esters   Take 2 g by mouth 2 (two) times daily.      metoprolol 50 MG tablet   Commonly known as: LOPRESSOR   Take  50 mg by mouth 3 (three) times daily.      nisoldipine 20 MG 24 hr tablet   Commonly known as: SULAR   Take 20 mg by mouth 2 (two) times daily.      nitroGLYCERIN 0.4 MG SL tablet   Commonly known as: NITROSTAT   Place 1 tablet (0.4 mg total) under the tongue every 5 (five) minutes x 3 doses as needed for chest pain.      simvastatin 40 MG tablet   Commonly known as: ZOCOR   Take 40 mg by mouth daily.      Vitamin D (Ergocalciferol) 50000 UNITS Caps   Commonly known as: DRISDOL   Take 50,000 Units by mouth every 7 (seven) days. Take Tuesday      warfarin 5 MG tablet   Commonly known as: COUMADIN   Take 1 tablet (5 mg total) by mouth daily.      Outstanding Labs/Studies  **Lexiscan Cardiolite 12/3 **EP study and Aflutter ablation 12/5  Duration of Discharge Encounter   Greater than 30 minutes including physician time.  Signed, Nicolasa Ducking NP 10/28/2012, 1:13 PM

## 2012-10-28 NOTE — Progress Notes (Signed)
Per nursing, it has been clarified that the patient has not been taking his PM dose of lasix nightly chronically as otherwise indicated on his home med rec. Will dc from patient's med list/orders. Dayna Dunn PA-C

## 2012-10-28 NOTE — Progress Notes (Signed)
Utilization Review Completed.   Adysen Raphael, RN, BSN Nurse Case Manager  [phone]  

## 2012-10-28 NOTE — Progress Notes (Signed)
ANTICOAGULATION CONSULT NOTE - Follow Up Consult  Pharmacy Consult for Heparin and Coumadin Indication: chest pain/ACS and atrial fibrillation  Allergies  Allergen Reactions  . Penicillins Itching and Rash    Patient Measurements: Height: 5' 7.32" (171 cm) Weight: 197 lb 1.5 oz (89.4 kg) IBW/kg (Calculated) : 66.84  Heparin Dosing Weight: 85.3 kg  Vital Signs: Temp: 97.7 F (36.5 C) (11/27 1006) Temp src: Oral (11/27 1006) BP: 133/79 mmHg (11/27 1006) Pulse Rate: 67  (11/27 1006)  Labs:  Basename 10/28/12 0826 10/27/12 2235 10/27/12 2230 10/27/12 1702 10/27/12 1052 10/27/12 1045  HGB 11.5* -- -- 11.5* -- --  HCT 34.5* -- -- 34.9* -- 37.4*  PLT 181 -- -- 167 -- 199  APTT -- -- -- -- 32 --  LABPROT 15.6* -- -- -- 13.5 --  INR 1.27 -- -- -- 1.04 --  HEPARINUNFRC 0.15* -- 0.25* -- -- --  CREATININE -- -- -- -- -- 11.20*  CKTOTAL -- -- -- -- -- --  CKMB -- -- -- -- -- --  TROPONINI -- 7.12* -- 2.65* -- --    Estimated Creatinine Clearance: 8.6 ml/min (by C-G formula based on Cr of 11.2).   Medical History: Past Medical History  Diagnosis Date  . Hypertension   . CAD (coronary artery disease)     s/p CABG 03/2012: LIMA to LAD, free RIMA to OM1, SVG to D1, sequential SVG to AM and LPLB2, EVH via right thigh and leg.  . Elevated cholesterol   . Ventricular hypertrophy   . Gout   . Peripheral vascular disease   . Heart murmur   . Migraines     "last one 1990's"  . Stroke 1995; 2001    denies residual  . Cellulitis 04/13/2012    RLE saphenous vein harvest incision   . Hyperparathyroidism   . Superficial vein thrombosis     R greater saphenous 04/2012  . PAF (paroxysmal atrial fibrillation)     Occurred post-op CABG  . CKD (chronic kidney disease) stage 5, GFR less than 15 ml/min     Diagnosed age 75, awaiting transplant    Medications:  Scheduled:     . allopurinol  100 mg Oral BID  . [COMPLETED] aspirin  324 mg Oral Once  . aspirin EC  81 mg Oral Daily    . calcitRIOL  0.25 mcg Oral Daily  . calcium carbonate  3 tablet Oral TID WC  . cloNIDine  0.2 mg Oral QHS  . fenofibrate  160 mg Oral Daily  . furosemide  160 mg Oral Daily  . furosemide  80 mg Oral QPM  . [COMPLETED] heparin  1,500 Units Intravenous Once  . [COMPLETED] heparin  4,000 Units Intravenous Once  . metoprolol  50 mg Oral TID  . nisoldipine  17 mg Oral BID  . omega-3 acid ethyl esters  2 g Oral BID  . simvastatin  40 mg Oral Daily  . sodium chloride  3 mL Intravenous Q12H  . Vitamin D (Ergocalciferol)  50,000 Units Oral Q7 days  . [COMPLETED] warfarin  10 mg Oral ONCE-1800  . Warfarin - Pharmacist Dosing Inpatient   Does not apply q1800  . [DISCONTINUED] aspirin  324 mg Oral Once  . [DISCONTINUED] cloNIDine  0.2 mg Oral Daily    Assessment: 51 yr old male admitted with chest pain and atrial flutter. Pt has CKD and is awaiting transplant. He was started on heparin and warfarin. Heparin level is still subtherapeutic. INR is 1.27  today.    Goal of Therapy:  Heparin level 0.3-0.7 units/ml Monitor platelets by anticoagulation protocol: Yes  INR 2.0-3.0  Plan:  1. Increase heparin to 1650 units/hr 2. Heparin level in 8 hours.  3. Coumadin 7.5mg  PO x1 4. F/u daily heparin/INR  [name] 10/28/2012,10:23 AM

## 2012-10-28 NOTE — Progress Notes (Signed)
SUBJECTIVE:  The patient has had no further palpitations. He has had no chest pain. He denies any shortness of breath.   PHYSICAL EXAM Filed Vitals:   10/27/12 1500 10/27/12 1554 10/27/12 1646 10/27/12 2100  BP:  109/68 133/78 145/78  Pulse:   55 59  Temp:  97.8 F (36.6 C) 97.9 F (36.6 C) 98 F (36.7 C)  TempSrc:  Oral Oral Oral  Resp:  18 18 18   Height: 5' 7.32" (1.71 m)     Weight: 197 lb 1.5 oz (89.4 kg)     SpO2:  100% 97% 96%   General:  No distress Lungs:  Clear Heart:  RRR, apical systolic murmur Abdomen:  Positive bowel sounds, no rebound no guarding Extremities:  Mild edema  LABS: Lab Results  Component Value Date   TROPONINI 7.12* 10/27/2012   Results for orders placed during the hospital encounter of 10/27/12 (from the past 24 hour(s))  CBC WITH DIFFERENTIAL     Status: Abnormal   Collection Time   10/27/12 10:45 AM      Component Value Range   WBC 7.8  4.0 - 10.5 K/uL   RBC 4.36  4.22 - 5.81 MIL/uL   Hemoglobin 12.5 (*) 13.0 - 17.0 g/dL   HCT 03.4 (*) 74.2 - 59.5 %   MCV 85.8  78.0 - 100.0 fL   MCH 28.7  26.0 - 34.0 pg   MCHC 33.4  30.0 - 36.0 g/dL   RDW 63.8  75.6 - 43.3 %   Platelets 199  150 - 400 K/uL   Neutrophils Relative 73  43 - 77 %   Neutro Abs 5.7  1.7 - 7.7 K/uL   Lymphocytes Relative 17  12 - 46 %   Lymphs Abs 1.3  0.7 - 4.0 K/uL   Monocytes Relative 6  3 - 12 %   Monocytes Absolute 0.5  0.1 - 1.0 K/uL   Eosinophils Relative 4  0 - 5 %   Eosinophils Absolute 0.3  0.0 - 0.7 K/uL   Basophils Relative 0  0 - 1 %   Basophils Absolute 0.0  0.0 - 0.1 K/uL  COMPREHENSIVE METABOLIC PANEL     Status: Abnormal   Collection Time   10/27/12 10:45 AM      Component Value Range   Sodium 138  135 - 145 mEq/L   Potassium 3.7  3.5 - 5.1 mEq/L   Chloride 100  96 - 112 mEq/L   CO2 19  19 - 32 mEq/L   Glucose, Bld 132 (*) 70 - 99 mg/dL   BUN 295 (*) 6 - 23 mg/dL   Creatinine, Ser 18.84 (*) 0.50 - 1.35 mg/dL   Calcium 9.4  8.4 - 16.6 mg/dL   Total Protein 6.3  6.0 - 8.3 g/dL   Albumin 2.8 (*) 3.5 - 5.2 g/dL   AST 30  0 - 37 U/L   ALT 48  0 - 53 U/L   Alkaline Phosphatase 62  39 - 117 U/L   Total Bilirubin 0.3  0.3 - 1.2 mg/dL   GFR calc non Af Amer 5 (*) >90 mL/min   GFR calc Af Amer 5 (*) >90 mL/min  PROTIME-INR     Status: Normal   Collection Time   10/27/12 10:52 AM      Component Value Range   Prothrombin Time 13.5  11.6 - 15.2 seconds   INR 1.04  0.00 - 1.49  APTT     Status: Normal  Collection Time   10/27/12 10:52 AM      Component Value Range   aPTT 32  24 - 37 seconds  POCT I-STAT TROPONIN I     Status: Abnormal   Collection Time   10/27/12 10:56 AM      Component Value Range   Troponin i, poc 0.21 (*) 0.00 - 0.08 ng/mL   Comment 3           POCT I-STAT TROPONIN I     Status: Abnormal   Collection Time   10/27/12  1:47 PM      Component Value Range   Troponin i, poc 0.25 (*) 0.00 - 0.08 ng/mL   Comment NOTIFIED PHYSICIAN     Comment 3           CBC     Status: Abnormal   Collection Time   10/27/12  5:02 PM      Component Value Range   WBC 5.8  4.0 - 10.5 K/uL   RBC 4.05 (*) 4.22 - 5.81 MIL/uL   Hemoglobin 11.5 (*) 13.0 - 17.0 g/dL   HCT 16.1 (*) 09.6 - 04.5 %   MCV 86.2  78.0 - 100.0 fL   MCH 28.4  26.0 - 34.0 pg   MCHC 33.0  30.0 - 36.0 g/dL   RDW 40.9  81.1 - 91.4 %   Platelets 167  150 - 400 K/uL  TROPONIN I     Status: Abnormal   Collection Time   10/27/12  5:02 PM      Component Value Range   Troponin I 2.65 (*) <0.30 ng/mL  TSH     Status: Normal   Collection Time   10/27/12  5:02 PM      Component Value Range   TSH 0.747  0.350 - 4.500 uIU/mL  HEMOGLOBIN A1C     Status: Abnormal   Collection Time   10/27/12  5:02 PM      Component Value Range   Hemoglobin A1C 5.8 (*) <5.7 %   Mean Plasma Glucose 120 (*) <117 mg/dL  HEPARIN LEVEL (UNFRACTIONATED)     Status: Abnormal   Collection Time   10/27/12 10:30 PM      Component Value Range   Heparin Unfractionated 0.25 (*) 0.30 -  0.70 IU/mL  TROPONIN I     Status: Abnormal   Collection Time   10/27/12 10:35 PM      Component Value Range   Troponin I 7.12 (*) <0.30 ng/mL    Intake/Output Summary (Last 24 hours) at 10/28/12 0830 Last data filed at 10/28/12 0710  Gross per 24 hour  Intake 535.73 ml  Output   1150 ml  Net -614.27 ml    ASSESSMENT AND PLAN:  NQWMI: This is likely related to his rapid rate with atrial flutter. However, I did review his catheterization films April. He did not have small vessel disease. He does needs stress testing which I would like to do in the hospital. However, he admittedly says he will not stay in the hospital for this but will consent to outpatient stress testing. He will have a [employer].  Murmur: I would like to check an echocardiogram before he goes home. I do not see a transthoracic echo other than a stress echo he had early this year. I would also like to look for wall motion abnormalities.  Atrial flutter: I consulted EP. Because of the post termination pauses witnessed ablation even for this first episode of flutter  would be indicated . Dr. Graciela Husbands is seeing him in consultation.   We again had careful discussion about his anticoagulation and I think the risk benefits weight in favor of Coumadin. He does not need Lovenox bridging however at discharge.  Renal failure: This is being closely followed by nephrology.  Fayrene Fearing Selia Wareing 10/28/2012 8:30 AM

## 2012-10-28 NOTE — Progress Notes (Signed)
Pt educated and informed of D/C information. Pt is aware of which medications to change and which medications to start. Pt aware of f/u appointments. Pt ready for D/C with wife.

## 2012-10-28 NOTE — Consult Note (Signed)
  ELECTROPHYSIOLOGY CONSULT NOTE    Patient ID: [name] MRN: [id number], DOB/AGE: 01/28/1961 51 y.o.  Admit date: 10/27/2012 Date of Consult: 10-28-2012  Primary Physician: LITTLE,KEVIN LORNE, MD Primary Cardiologist: Nishan  Reason for Consultation:  Atrial flutter  HPI:  [name] is a 51 year old male who was admitted yesterday with atrial flutter and RVR. He converted on Cardizem with a 8 second post termination pause.  EP has been asked to evaluate for treatment options.  He has a history of coronary artery disease status post CABG (03-2012), stroke (thought to be from small vessel disease), chronic kidney disease (pending transplant in April-- son is donor), paroxysmal atrial fibrillation (post bypass).  He denies chest pain, shortness of breath.  He does report a prior history of syncope while he was in college and attributes it to ETOH consumption.  He had a sensation of facial flushing with his post termination pause.  He does not remember having this feeling before.  He endorses some tachycardia while exercising but not otherwise.    Stress echo in 01-2012 demonstrated an EF of 60% with mild MR.  Repeat echo this hospitalization is pending.   With history of prior stroke, vascular disease and hypertension, CHADS  score is 3.    Past Medical History  Diagnosis Date  . Hypertension   . CAD (coronary artery disease)     s/p CABG 03/2012: LIMA to LAD, free RIMA to OM1, SVG to D1, sequential SVG to AM and LPLB2, EVH via right thigh and leg.  . Elevated cholesterol   . Ventricular hypertrophy   . Gout   . Peripheral vascular disease   . Migraines     "last one 1990's"  . Stroke 1995; 2001    denies residual  . Cellulitis 04/13/2012    RLE saphenous vein harvest incision   . Hyperparathyroidism   . Superficial vein thrombosis     R greater saphenous 04/2012  . PAF and Flutter     Afib post-op CABG; AFlutter 11/13  . CKD (chronic kidney disease) stage 5, GFR less  than 15 ml/min     Diagnosed age 17, awaiting transplant  . Sinus node dysfunction-post termination pause     >8sec     Surgical History:  Past Surgical History  Procedure Date  . Cardiac catheterization 03/16/12  . Renal artery stent 2001  . Fracture surgery   . Av fistula placement 2011    left forearm  . Elbow fracture surgery 1972    left  . Hernia repair 09/2009    umbilical  . Inguinal hernia repair 09/2009    bilaterally  . Dental surgery 2008    multiple  . Coronary artery bypass graft 03/19/2012    Procedure: CORONARY ARTERY BYPASS GRAFTING (CABG);  Surgeon: Clarence H Owen, MD;  Location: [facility] OR;  Service: Open Heart Surgery;  Laterality: N/A;  (B) MAMMARY  . Av fistula placement      Prescriptions prior to admission  Medication Sig Dispense Refill  . allopurinol (ZYLOPRIM) 100 MG tablet Take 100 mg by mouth 2 (two) times daily.       . aspirin EC 325 MG EC tablet Take 1 tablet (325 mg total) by mouth daily.  30 tablet    . calcitRIOL (ROCALTROL) 0.25 MCG capsule Take 0.25 mcg by mouth daily.       . calcium carbonate (TUMS EX) 750 MG chewable tablet Chew 2 tablets by mouth 3 (three) times daily. With   meals      . cloNIDine (CATAPRES) 0.2 MG tablet Take 0.2 mg by mouth daily.       . clopidogrel (PLAVIX) 75 MG tablet Take 75 mg by mouth daily. Stopped on 4-10 for procedure      . fenofibrate micronized (LOFIBRA) 134 MG capsule Take 134 mg by mouth daily before breakfast.      . furosemide (LASIX) 80 MG tablet Take 160 mg by mouth daily. Patient takes 160 mg in the AM and 80 mg in the PM      . metoprolol (LOPRESSOR) 50 MG tablet Take 50 mg by mouth 3 (three) times daily.       . nisoldipine (SULAR) 20 MG 24 hr tablet Take 20 mg by mouth 2 (two) times daily.      . omega-3 acid ethyl esters (LOVAZA) 1 G capsule Take 2 g by mouth 2 (two) times daily.      . simvastatin (ZOCOR) 40 MG tablet Take 40 mg by mouth daily.      . Vitamin D, Ergocalciferol, (DRISDOL) 50000 UNITS  CAPS Take 50,000 Units by mouth every 7 (seven) days. Take Tuesday         Inpatient Medications:    . allopurinol  100 mg Oral BID  . [COMPLETED] aspirin  324 mg Oral Once  . aspirin EC  81 mg Oral Daily  . calcitRIOL  0.25 mcg Oral Daily  . calcium carbonate  3 tablet Oral TID WC  . cloNIDine  0.2 mg Oral QHS  . fenofibrate  160 mg Oral Daily  . furosemide  160 mg Oral Daily  . furosemide  80 mg Oral QPM  . metoprolol  50 mg Oral TID  . nisoldipine  17 mg Oral BID  . omega-3 acid ethyl esters  2 g Oral BID  . simvastatin  40 mg Oral Daily  . sodium chloride  3 mL Intravenous Q12H  . Vitamin D (Ergocalciferol)  50,000 Units Oral Q7 days  . Warfarin - Pharmacist Dosing Inpatient   Does not apply q1800    Allergies:  Allergies  Allergen Reactions  . Penicillins Itching and Rash    History   Social History  . Marital Status: Married    Spouse Name: N/A    Number of Children: N/A  . Years of Education: N/A   Occupational History  . Not on file.   Social History Main Topics  . Smoking status: Former Smoker -- 0.3 packs/day for 30 years    Types: Cigarettes    Quit date: 03/15/2012  . Smokeless tobacco: Never Used  . Alcohol Use: Yes     Comment: 04/13/12 "2 mixed drinks/month"  . Drug Use: No  . Sexually Active: Yes    Family History  Problem Relation Age of Onset  . Adopted: Yes    Review of Systems -as noted previously  Alert and oriented middle-aged Caucasian male in no acute distress HENT- normal Eyes- EOMI, without scleral icterus Skin- warm and dry; without rashes LN-neg Neck- supple without thyromegaly, JVP-flat, carotids brisk and full without bruits Back-without CVAT or kyphosis; healed median sternotomy Lungs-clear to auscultation CV-Regular rate and rhythm, nl S1 and S2, S4 with 2/6 systolic ejection murmur Abd-soft with active bowel sounds; no midline pulsation or hepatomegaly Pulses-intact femoral and distal MKS-without gross  deformity Neuro- Ax O, CN3-12 intact, grossly normal motor and sensory function Affect engaging    Labs:   Lab Results  Component Value Date   WBC   8.0 10/28/2012   HGB 11.5* 10/28/2012   HCT 34.5* 10/28/2012   MCV 86.7 10/28/2012   PLT 181 10/28/2012     Lab 10/28/12 0826 10/27/12 1045  NA 139 --  K 3.5 --  CL 101 --  CO2 18* --  BUN 141* --  CREATININE 10.82* --  CALCIUM 9.1 --  PROT -- 6.3  BILITOT -- 0.3  ALKPHOS -- 62  ALT -- 48  AST -- 30  GLUCOSE 161* --   Lab Results  Component Value Date   TROPONINI 6.24* 10/28/2012     Radiology/Studies: Dg Chest Portable 1 View 10/27/2012  *RADIOLOGY REPORT*  Clinical history:  Chest pain  PORTABLE CHEST - 1 VIEW  Comparison: Apr 10, 2012  Findings: Lungs clear.  Heart is mildly enlarged with normal pulmonary vascularity.  The patient is status post coronary artery bypass grafting.  No adenopathy.  No bone lesions.  IMPRESSION: Mild cardiac enlargement.  No edema or consolidation.   Original Report Authenticated By: William Woodruff, M.D.    EKG: 10-27-2012 typical atrial flutter with variable conduction, sinus brady with PVC's post cardioversion  TELEMETRY: sinus rhythm with PVC's  Patient Active Hospital Problem List: CRF (chronic renal failure) (01/15/2012)   PAF and Aflutter (07/09/2012)     Sinus node dysfunction-post termination pause ()   Stroke ()  Elevated troponin      The patient has atrial flutter-typical with a profound posttermination pauses. The likelihood of recurrence of atrial flutter is thought to be very high, greater than 90% in its posttermination pauses are a consequence of that, especially to this magnitude, syncope is a concern. I would therefore favor catheter ablation of the atrial flutter substrate. This procedure has been reviewed extensively with the patient including but not limited to risks of heart block and perforation he understands these risks and is willing to proceed.  I worry  about the likelihood of recurrence of atrial fibrillation. In the setting of hypertensive heart disease atrial enlargement is likely and postoperative atrial fibrillation portends at some increased risk. Atrial fibrillation might well be associated with post termination pausing and at that point more definitive therapy i.e. pacing would need to be considered.  His renal insufficiency also informs thoughts regarding anticoagulation. Not withstanding the presumption that his strokes were related to small vessel disease, I think it is reasonable to consider with his history of atrial arrhythmias in the high risk for thromboembolism. The risk benefit of Coumadin in patients with severe renal insufficiency is clearly not as straightforward as in a normal renal function cohort with increased risks of bleeding and perhaps potentiated by Coumadin and increased risk of clotting with perhaps less benefit from Coumadin   Still recommendations are that the patient be treated with anticoagulation. Following renal transplantation he might be a candidate for NOAC.  With his troponin level of 7, this is likely related to rapid rates and impaired clearance related to renal insufficiency and is unlikely related to obstructive coronary disease. It is reasonable however to undertake Myoview scanning as you're planning  

## 2012-11-02 ENCOUNTER — Ambulatory Visit (INDEPENDENT_AMBULATORY_CARE_PROVIDER_SITE_OTHER): Payer: 59 | Admitting: *Deleted

## 2012-11-02 ENCOUNTER — Encounter (HOSPITAL_COMMUNITY): Payer: 59

## 2012-11-02 DIAGNOSIS — I6350 Cerebral infarction due to unspecified occlusion or stenosis of unspecified cerebral artery: Secondary | ICD-10-CM

## 2012-11-02 DIAGNOSIS — I4891 Unspecified atrial fibrillation: Secondary | ICD-10-CM

## 2012-11-02 DIAGNOSIS — I25810 Atherosclerosis of coronary artery bypass graft(s) without angina pectoris: Secondary | ICD-10-CM

## 2012-11-02 DIAGNOSIS — I639 Cerebral infarction, unspecified: Secondary | ICD-10-CM

## 2012-11-02 DIAGNOSIS — I480 Paroxysmal atrial fibrillation: Secondary | ICD-10-CM

## 2012-11-02 DIAGNOSIS — Z7901 Long term (current) use of anticoagulants: Secondary | ICD-10-CM

## 2012-11-02 DIAGNOSIS — I214 Non-ST elevation (NSTEMI) myocardial infarction: Secondary | ICD-10-CM

## 2012-11-03 ENCOUNTER — Ambulatory Visit (HOSPITAL_COMMUNITY): Payer: 59 | Attending: Internal Medicine | Admitting: Radiology

## 2012-11-03 VITALS — BP 136/79 | HR 58 | Ht 67.0 in | Wt 194.0 lb

## 2012-11-03 DIAGNOSIS — I739 Peripheral vascular disease, unspecified: Secondary | ICD-10-CM | POA: Insufficient documentation

## 2012-11-03 DIAGNOSIS — R079 Chest pain, unspecified: Secondary | ICD-10-CM

## 2012-11-03 DIAGNOSIS — I2510 Atherosclerotic heart disease of native coronary artery without angina pectoris: Secondary | ICD-10-CM

## 2012-11-03 DIAGNOSIS — R0789 Other chest pain: Secondary | ICD-10-CM | POA: Insufficient documentation

## 2012-11-03 DIAGNOSIS — R0602 Shortness of breath: Secondary | ICD-10-CM

## 2012-11-03 DIAGNOSIS — I10 Essential (primary) hypertension: Secondary | ICD-10-CM | POA: Insufficient documentation

## 2012-11-03 DIAGNOSIS — I4892 Unspecified atrial flutter: Secondary | ICD-10-CM | POA: Insufficient documentation

## 2012-11-03 MED ORDER — TECHNETIUM TC 99M SESTAMIBI GENERIC - CARDIOLITE
11.0000 | Freq: Once | INTRAVENOUS | Status: AC | PRN
  Administered 2012-11-03: 11 via INTRAVENOUS

## 2012-11-03 MED ORDER — TECHNETIUM TC 99M SESTAMIBI GENERIC - CARDIOLITE
33.0000 | Freq: Once | INTRAVENOUS | Status: AC | PRN
  Administered 2012-11-03: 33 via INTRAVENOUS

## 2012-11-03 MED ORDER — REGADENOSON 0.4 MG/5ML IV SOLN
0.4000 mg | Freq: Once | INTRAVENOUS | Status: AC
  Administered 2012-11-03: 0.4 mg via INTRAVENOUS

## 2012-11-03 NOTE — Progress Notes (Signed)
[facility] SITE 3 NUCLEAR MED [id number] [place] [address] [phone]  Cardiology Nuclear Med [name] is a 51 y.o. male     MRN : [id number]     DOB: 08-10-1961  Procedure Date: 11/03/2012  Nuclear Med Background Indication for Stress Test:  Evaluation for Ischemia, Graft Patency and Post Hospital on 10/27/12 Atrial Flutter with RVR, elevated troponin. Patient scheduled for Ablation on 11/05/12 with Dr. Berton Mount. History:  1/13 MPS([place]):No report; 4/13 CABG; 11/13 NSTEMI; 11/13 Echo:EF=65%, mild LVH Cardiac Risk Factors: CVA, History of Smoking, Hypertension, Lipids, Overweight and PVD  Symptoms:  Chest Pressure and SOB only with Atrial Flutter(none since d/c); Diaphoresis; Rapid Heart Rate   Nuclear Pre-Procedure Caffeine/Decaff Intake:  None NPO After: 7:00am   Lungs:  Clear. O2 Sat: 95% on room air. IV 0.9% NS with Angio Cath:  22g  IV Site: R Forearm  IV Started by:  Cathlyn Parsons, RN  Chest Size (in):  42 Cup Size: n/a  Height: 5\' 7"  (1.702 m)  Weight:  194 lb (87.998 kg)  BMI:  Body mass index is 30.38 kg/(m^2). Tech Comments:  Lopressor held x 12 hours.    Nuclear Med Study 1 or 2 day study: 1 day  Stress Test Type:  Lexiscan  Reading MD: Charlton Haws, MD  Order Authorizing Provider:  [name], MD  Resting Radionuclide: Technetium 57m Sestamibi  Resting Radionuclide Dose: 11.0 mCi   Stress Radionuclide:  Technetium 36m Sestamibi  Stress Radionuclide Dose: 33.0 mCi           Stress Protocol Rest HR: 58 Stress HR: 63  Rest BP: 136/79 Stress BP: 123/81  Exercise Time (min): n/a METS: n/a   Predicted Max HR: 171 bpm % Max HR: 36.84 bpm Rate Pressure Product: 7749   Dose of Adenosine (mg):  n/a Dose of Lexiscan: 0.4 mg  Dose of Atropine (mg): n/a Dose of Dobutamine: n/a mcg/kg/min (at max HR)  Stress Test Technologist: Smiley Houseman, CMA-N  Nuclear Technologist:  Domenic Polite, CNMT      Rest Procedure:  Myocardial perfusion imaging was performed at rest 45 minutes following the intravenous administration of Technetium 76m Sestamibi.  Rest ECG: Marked inferolateral T wave inversions  Stress Procedure:  The patient received IV Lexiscan 0.4 mg over 15-seconds.  Technetium 28m Tetrofosmin injected at 30-seconds.   Quantitative spect images were obtained after a 45 minute delay.  Stress ECG: No significant change from baseline ECG  QPS Raw Data Images:  Normal; no motion artifact; normal heart/lung ratio. Stress Images:  Normal homogeneous uptake in all areas of the myocardium. Rest Images:  Normal homogeneous uptake in all areas of the myocardium. Subtraction (SDS):  Normal Transient Ischemic Dilatation (Normal <1.22):  1.12 Lung/Heart Ratio (Normal <0.45):  0.26  Quantitative Gated Spect Images QGS EDV:  171 ml QGS ESV:  87 ml  Impression Exercise Capacity:  Lexiscan with low level exercise. BP Response:  Normal blood pressure response. Clinical Symptoms:  Mild lightheadedness ECG Impression:  No significant ST segment change suggestive of ischemia. Comparison with Prior Nuclear Study: No images to compare  Overall Impression:  Normal stress nuclear study. Markedly abnormal baseline ECG  LV Ejection Fraction: 49%.  LV Wall Motion:  Mild diffuse hypokinesis EF 49%    Charlton Haws

## 2012-11-04 ENCOUNTER — Encounter (HOSPITAL_COMMUNITY): Payer: 59

## 2012-11-05 ENCOUNTER — Ambulatory Visit (HOSPITAL_COMMUNITY): Admission: RE | Admit: 2012-11-05 | Payer: 59 | Source: Ambulatory Visit | Admitting: Internal Medicine

## 2012-11-05 ENCOUNTER — Encounter (HOSPITAL_COMMUNITY): Payer: Self-pay | Admitting: Pharmacy Technician

## 2012-11-05 ENCOUNTER — Other Ambulatory Visit: Payer: Self-pay | Admitting: *Deleted

## 2012-11-05 ENCOUNTER — Encounter (HOSPITAL_COMMUNITY): Admission: RE | Payer: Self-pay | Source: Ambulatory Visit

## 2012-11-05 ENCOUNTER — Encounter (HOSPITAL_COMMUNITY): Payer: Self-pay | Admitting: Certified Registered Nurse Anesthetist

## 2012-11-05 ENCOUNTER — Ambulatory Visit (INDEPENDENT_AMBULATORY_CARE_PROVIDER_SITE_OTHER): Payer: 59

## 2012-11-05 DIAGNOSIS — Z7901 Long term (current) use of anticoagulants: Secondary | ICD-10-CM

## 2012-11-05 DIAGNOSIS — I25810 Atherosclerosis of coronary artery bypass graft(s) without angina pectoris: Secondary | ICD-10-CM

## 2012-11-05 DIAGNOSIS — I4892 Unspecified atrial flutter: Secondary | ICD-10-CM

## 2012-11-05 DIAGNOSIS — I4891 Unspecified atrial fibrillation: Secondary | ICD-10-CM

## 2012-11-05 DIAGNOSIS — I480 Paroxysmal atrial fibrillation: Secondary | ICD-10-CM

## 2012-11-05 DIAGNOSIS — I214 Non-ST elevation (NSTEMI) myocardial infarction: Secondary | ICD-10-CM

## 2012-11-05 DIAGNOSIS — I6350 Cerebral infarction due to unspecified occlusion or stenosis of unspecified cerebral artery: Secondary | ICD-10-CM

## 2012-11-05 DIAGNOSIS — I639 Cerebral infarction, unspecified: Secondary | ICD-10-CM

## 2012-11-05 LAB — POCT INR: INR: 5.9

## 2012-11-05 SURGERY — ATRIAL FLUTTER ABLATION
Anesthesia: Monitor Anesthesia Care

## 2012-11-06 ENCOUNTER — Telehealth: Payer: Self-pay | Admitting: Internal Medicine

## 2012-11-06 ENCOUNTER — Encounter (HOSPITAL_COMMUNITY): Admission: RE | Admit: 2012-11-06 | Payer: 59 | Source: Ambulatory Visit

## 2012-11-06 NOTE — Telephone Encounter (Signed)
This is a [name] patient who is scheduled for SK on Mon

## 2012-11-06 NOTE — Telephone Encounter (Signed)
plz return call to pt [phone] regarding upcoming procedure schedule w/Allred.

## 2012-11-06 NOTE — Telephone Encounter (Signed)
Patient will need to have labs drawn on Sat per Dr Graciela Husbands.  He will either go to the Main lab at [facility] or [facility].  I have faxed an order to both places and they are going to page Dr Graciela Husbands at [phone] with results.  He needs to have a BMP/CBCw diff/INR and a Liver profile  His procedure is Monday morning at 7:30am.  He knows to report to Short Stay at 5:30am

## 2012-11-07 LAB — BASIC METABOLIC PANEL
Calcium: 9.7 mg/dL (ref 8.4–10.5)
GFR calc Af Amer: 6 mL/min — ABNORMAL LOW (ref >90)
GFR calc non Af Amer: 5 mL/min — ABNORMAL LOW (ref >90)
Glucose, Bld: 106 mg/dL — ABNORMAL HIGH (ref 70–99)
Potassium: 3.9 mEq/L (ref 3.5–5.1)
Sodium: 140 mEq/L (ref 135–145)

## 2012-11-07 LAB — HEPATIC FUNCTION PANEL
AST: 30 U/L (ref 0–37)
Albumin: 3.2 g/dL — ABNORMAL LOW (ref 3.5–5.2)
Total Protein: 6.7 g/dL (ref 6.0–8.3)

## 2012-11-07 LAB — CBC
Hemoglobin: 13.9 g/dL (ref 13.0–17.0)
MCH: 29.4 pg (ref 26.0–34.0)
Platelets: 189 10*3/uL (ref 150–400)
RBC: 4.73 MIL/uL (ref 4.22–5.81)
WBC: 6.2 10*3/uL (ref 4.0–10.5)

## 2012-11-07 LAB — PROTIME-INR
INR: 2.96 — ABNORMAL HIGH (ref 0.00–1.49)
Prothrombin Time: 29.3 seconds — ABNORMAL HIGH (ref 11.6–15.2)

## 2012-11-09 ENCOUNTER — Encounter (HOSPITAL_COMMUNITY): Payer: Self-pay | Admitting: *Deleted

## 2012-11-09 ENCOUNTER — Ambulatory Visit (HOSPITAL_COMMUNITY)
Admission: RE | Admit: 2012-11-09 | Discharge: 2012-11-09 | Disposition: A | Discharge status: Home / Self Care | Payer: 59 | Source: Ambulatory Visit | Attending: Internal Medicine | Admitting: Internal Medicine

## 2012-11-09 ENCOUNTER — Encounter (HOSPITAL_COMMUNITY): Payer: Self-pay | Admitting: Anesthesiology

## 2012-11-09 ENCOUNTER — Ambulatory Visit (HOSPITAL_COMMUNITY): Payer: 59 | Admitting: Anesthesiology

## 2012-11-09 ENCOUNTER — Encounter (HOSPITAL_COMMUNITY)
Admission: RE | Disposition: A | Discharge status: Home / Self Care | Payer: Self-pay | Source: Ambulatory Visit | Attending: Internal Medicine

## 2012-11-09 ENCOUNTER — Encounter (HOSPITAL_COMMUNITY): Admission: RE | Admit: 2012-11-09 | Payer: 59 | Source: Ambulatory Visit

## 2012-11-09 DIAGNOSIS — Z79899 Other long term (current) drug therapy: Secondary | ICD-10-CM | POA: Insufficient documentation

## 2012-11-09 DIAGNOSIS — N189 Chronic kidney disease, unspecified: Secondary | ICD-10-CM | POA: Insufficient documentation

## 2012-11-09 DIAGNOSIS — I480 Paroxysmal atrial fibrillation: Secondary | ICD-10-CM

## 2012-11-09 DIAGNOSIS — Z7902 Long term (current) use of antithrombotics/antiplatelets: Secondary | ICD-10-CM | POA: Insufficient documentation

## 2012-11-09 DIAGNOSIS — N183 Chronic kidney disease, stage 3 unspecified: Secondary | ICD-10-CM | POA: Diagnosis present

## 2012-11-09 DIAGNOSIS — I129 Hypertensive chronic kidney disease with stage 1 through stage 4 chronic kidney disease, or unspecified chronic kidney disease: Secondary | ICD-10-CM | POA: Insufficient documentation

## 2012-11-09 DIAGNOSIS — I4892 Unspecified atrial flutter: Secondary | ICD-10-CM

## 2012-11-09 DIAGNOSIS — Q278 Other specified congenital malformations of peripheral vascular system: Secondary | ICD-10-CM | POA: Insufficient documentation

## 2012-11-09 DIAGNOSIS — Z7901 Long term (current) use of anticoagulants: Secondary | ICD-10-CM

## 2012-11-09 DIAGNOSIS — I2510 Atherosclerotic heart disease of native coronary artery without angina pectoris: Secondary | ICD-10-CM | POA: Insufficient documentation

## 2012-11-09 HISTORY — PX: ATRIAL FLUTTER ABLATION: SHX5733

## 2012-11-09 LAB — PROTIME-INR
INR: 2.37 — ABNORMAL HIGH (ref 0.00–1.49)
Prothrombin Time: 24.8 seconds — ABNORMAL HIGH (ref 11.6–15.2)

## 2012-11-09 SURGERY — ATRIAL FLUTTER ABLATION
Anesthesia: Monitor Anesthesia Care

## 2012-11-09 MED ORDER — WARFARIN - PHYSICIAN DOSING INPATIENT: Freq: Every day | Status: DC

## 2012-11-09 MED ORDER — FENTANYL CITRATE 0.05 MG/ML IJ SOLN: 50.0000 ug | Freq: Once | INTRAMUSCULAR | Status: DC

## 2012-11-09 MED ORDER — NISOLDIPINE ER 20 MG PO TB24: 20.0000 mg | ORAL_TABLET | Freq: Two times a day (BID) | ORAL | Status: DC

## 2012-11-09 MED ORDER — BUPIVACAINE HCL (PF) 0.25 % IJ SOLN
INTRAMUSCULAR | Status: AC
  Filled 2012-11-09: qty 60

## 2012-11-09 MED ORDER — PROMETHAZINE HCL 25 MG/ML IJ SOLN: 6.2500 mg | INTRAMUSCULAR | Status: DC | PRN

## 2012-11-09 MED ORDER — OMEGA-3-ACID ETHYL ESTERS 1 G PO CAPS
2.0000 g | ORAL_CAPSULE | Freq: Two times a day (BID) | ORAL | Status: DC
  Filled 2012-11-09: qty 2

## 2012-11-09 MED ORDER — CLONIDINE HCL 0.2 MG PO TABS: 0.2000 mg | ORAL_TABLET | Freq: Every day | ORAL | Status: DC

## 2012-11-09 MED ORDER — CALCITRIOL 0.25 MCG PO CAPS: 0.2500 ug | ORAL_CAPSULE | Freq: Every day | ORAL | Status: DC

## 2012-11-09 MED ORDER — ACETAMINOPHEN 325 MG PO TABS: 650.0000 mg | ORAL_TABLET | ORAL | Status: DC | PRN

## 2012-11-09 MED ORDER — FENTANYL CITRATE 0.05 MG/ML IJ SOLN
INTRAMUSCULAR | Status: DC | PRN
  Administered 2012-11-09 (×4): 50 ug via INTRAVENOUS

## 2012-11-09 MED ORDER — VITAMIN D (ERGOCALCIFEROL) 1.25 MG (50000 UNIT) PO CAPS: 50000.0000 [IU] | ORAL_CAPSULE | ORAL | Status: DC

## 2012-11-09 MED ORDER — MIDAZOLAM HCL 5 MG/5ML IJ SOLN
INTRAMUSCULAR | Status: DC | PRN
  Administered 2012-11-09: 2 mg via INTRAVENOUS

## 2012-11-09 MED ORDER — SIMVASTATIN 40 MG PO TABS
40.0000 mg | ORAL_TABLET | Freq: Every day | ORAL | Status: DC
  Filled 2012-11-09: qty 1

## 2012-11-09 MED ORDER — FENTANYL CITRATE 0.05 MG/ML IJ SOLN: 25.0000 ug | INTRAMUSCULAR | Status: DC | PRN

## 2012-11-09 MED ORDER — HEPARIN (PORCINE) IN NACL 2-0.9 UNIT/ML-% IJ SOLN
INTRAMUSCULAR | Status: AC
  Filled 2012-11-09: qty 500

## 2012-11-09 MED ORDER — WARFARIN SODIUM 5 MG PO TABS: 2.5000 mg | ORAL_TABLET | Freq: Every day | ORAL | Status: DC

## 2012-11-09 MED ORDER — SODIUM CHLORIDE 0.9 % IV SOLN
INTRAVENOUS | Status: DC
  Administered 2012-11-09: 06:00:00 via INTRAVENOUS

## 2012-11-09 MED ORDER — OXYCODONE HCL 5 MG/5ML PO SOLN: 5.0000 mg | Freq: Once | ORAL | Status: DC | PRN

## 2012-11-09 MED ORDER — ALLOPURINOL 100 MG PO TABS
100.0000 mg | ORAL_TABLET | Freq: Two times a day (BID) | ORAL | Status: DC
  Filled 2012-11-09: qty 1

## 2012-11-09 MED ORDER — WARFARIN SODIUM 5 MG PO TABS
5.0000 mg | ORAL_TABLET | Freq: Every day | ORAL | Status: DC
  Filled 2012-11-09: qty 1

## 2012-11-09 MED ORDER — ONDANSETRON HCL 4 MG/2ML IJ SOLN: 4.0000 mg | Freq: Four times a day (QID) | INTRAMUSCULAR | Status: DC | PRN

## 2012-11-09 MED ORDER — LACTATED RINGERS IV SOLN
INTRAVENOUS | Status: DC | PRN
  Administered 2012-11-09: 07:00:00 via INTRAVENOUS

## 2012-11-09 MED ORDER — SODIUM CHLORIDE 0.9 % IJ SOLN: 3.0000 mL | Freq: Two times a day (BID) | INTRAMUSCULAR | Status: DC

## 2012-11-09 MED ORDER — LIDOCAINE HCL (CARDIAC) 20 MG/ML IV SOLN
INTRAVENOUS | Status: DC | PRN
  Administered 2012-11-09: 50 mg via INTRAVENOUS

## 2012-11-09 MED ORDER — SODIUM CHLORIDE 0.9 % IJ SOLN: 3.0000 mL | INTRAMUSCULAR | Status: DC | PRN

## 2012-11-09 MED ORDER — FUROSEMIDE 80 MG PO TABS
160.0000 mg | ORAL_TABLET | Freq: Every morning | ORAL | Status: DC
  Administered 2012-11-09: 160 mg via ORAL
  Filled 2012-11-09: qty 2

## 2012-11-09 MED ORDER — SODIUM CHLORIDE 0.9 % IV SOLN: 250.0000 mL | INTRAVENOUS | Status: DC | PRN

## 2012-11-09 MED ORDER — SODIUM CHLORIDE 0.9 % IV SOLN: INTRAVENOUS | Status: AC

## 2012-11-09 MED ORDER — METOPROLOL TARTRATE 50 MG PO TABS: 25.0000 mg | ORAL_TABLET | Freq: Three times a day (TID) | ORAL | Status: DC

## 2012-11-09 MED ORDER — METOPROLOL TARTRATE 50 MG PO TABS
50.0000 mg | ORAL_TABLET | Freq: Three times a day (TID) | ORAL | Status: DC
  Filled 2012-11-09 (×2): qty 1

## 2012-11-09 MED ORDER — MIDAZOLAM HCL 2 MG/2ML IJ SOLN: 1.0000 mg | INTRAMUSCULAR | Status: DC | PRN

## 2012-11-09 MED ORDER — PROPOFOL 10 MG/ML IV BOLUS
INTRAVENOUS | Status: DC | PRN
  Administered 2012-11-09: 150 mg via INTRAVENOUS

## 2012-11-09 MED ORDER — PHENYLEPHRINE HCL 10 MG/ML IJ SOLN
10.0000 mg | INTRAVENOUS | Status: DC | PRN
  Administered 2012-11-09: 10 ug/min via INTRAVENOUS

## 2012-11-09 MED ORDER — OXYCODONE HCL 5 MG PO TABS: 5.0000 mg | ORAL_TABLET | Freq: Once | ORAL | Status: DC | PRN

## 2012-11-09 MED ORDER — SODIUM CHLORIDE 0.9 % IV SOLN
INTRAVENOUS | Status: DC | PRN
  Administered 2012-11-09: 07:00:00 via INTRAVENOUS

## 2012-11-09 NOTE — Interval H&P Note (Signed)
History and Physical Interval Note:  11/09/2012 7:14 AM  [name]  has presented today for surgery, with the diagnosis of aflutter  The various methods of treatment have been discussed with the patient and family. After consideration of risks, benefits and other options for treatment, the patient has consented to  Procedure(s) (LRB) with comments: ATRIAL FLUTTER ABLATION (N/A) as a surgical intervention .  The patient's history has been reviewed, patient examined, no change in status, stable for surgery.  I have reviewed the patient's chart and labs.  Questions were answered to the patient's satisfaction.     [name]  In sinus thyhtm  For RFCA today of AFlutter substrate

## 2012-11-09 NOTE — Care Management Note (Signed)
    Page 1 of 1   11/09/2012     1:43:26 PM   CARE MANAGEMENT NOTE 11/09/2012  Patient:  [name], [name]   Account Number:  [id number]  Date Initiated:  11/09/2012  Documentation initiated by:  GRAVES-BIGELOW,Shakyla Nolley  Subjective/Objective Assessment:   Pt in for ablation.     Action/Plan:   No needs form CM at this time.   Anticipated DC Date:  11/09/2012   Anticipated DC Plan:  HOME/SELF CARE         Choice offered to / List presented to:             Status of service:  Completed, signed off Medicare Important Message given?   (If response is "NO", the following Medicare IM given date fields will be blank) Date Medicare IM given:   Date Additional Medicare IM given:    Discharge Disposition:  HOME/SELF CARE  Per UR Regulation:  Reviewed for med. necessity/level of care/duration of stay  If discussed at Long Length of Stay Meetings, dates discussed:    Comments:

## 2012-11-09 NOTE — Progress Notes (Signed)
Pt provided with dc instructinos and education. Pt verbalized understanding. Pt has no questions at this time. VSS. Vascular sites level 0. IV removed with tip intact. Heart monitor cleaned and returned to front. Pt leaving for home. Levonne Spiller, RN

## 2012-11-09 NOTE — Transfer of Care (Signed)
Immediate Anesthesia Transfer of Care Note  Patient: [name]  Procedure(s) Performed: Procedure(s) (LRB) with comments: ATRIAL FLUTTER ABLATION (N/A)  Patient Location: PACU and Cath Lab  Anesthesia Type:General  Level of Consciousness: awake and alert   Airway & Oxygen Therapy: Patient Spontanous Breathing  Post-op Assessment: Report given to PACU RN  Post vital signs: stable  Complications: No apparent anesthesia complications

## 2012-11-09 NOTE — Op Note (Signed)
NAMEHARRELL, NIEHOFF NO.:  [id number]  MEDICAL RECORD NO.:  [id number]  LOCATION:  [id number]                        FACILITY:  [facility]  PHYSICIAN:  Duke Salvia, MD, FACCDATE OF BIRTH:  [birth year]  DATE OF PROCEDURE:  11/09/2012 DATE OF DISCHARGE:                              OPERATIVE REPORT   PREOPERATIVE DIAGNOSIS:  Atrial flutter.  POSTOPERATIVE DIAGNOSIS:  Atrial flutter; left femoral vein joins the IVC just below the liver.  PROCEDURE:  Invasive electrophysiological study with mapping and catheter ablation.  Following obtaining informed consent, the patient was brought to the electrophysiology laboratory and placed on the fluoroscopic table in the supine position.  He was submitted for general anesthesia under the care of Dr. Gypsy Balsam.  Local anesthesia was administered in the areas of sheath insertion and following sedation and preparation, catheterization was performed.  Following the procedure, the catheters were removed, the sheaths were left in place, and the patient was transferred to the holding area.  The patient tolerated the procedure without apparent complication.  Catheters of 5-French quadripolar catheter was inserted via left femoral vein to the AV junction.  A 6-French decapolar catheter was inserted via the right femoral vein to the coronary sinus.  A 7-French dual decapolar catheter was inserted via the left femoral vein to the tricuspid anulus.  An 8-French 10 mm deflectable tip ablation catheter was inserted the right femoral vein to mapping sites and posterior septal space.  END-TIDAL RESULTS:  End-tidal surface electrocardiogram and basic measurements. Rhythm:  Sinus; RR interval 997 milliseconds; PR interval 149 millisecond; P-wave duration 131 millisecond; QRS duration 115 millisecond; QT interval 459 millisecond. AH interval is 87 millisecond.  HV interval 40 milliseconds. Rhythm:  Final; sinus; RR interval 1187 millisecond;  PR interval 137 millisecond; P-wave duration 109 millisecond; QRS duration 110 milliseconds; QT interval 560 milliseconds; HV interval N/M.  END-TIDAL AV NODAL FUNCTION:  AV Wenckebach was 350 milliseconds.  VA conduction was associated with evidence of decremental conduction noted with single ventricular extrastimuli, but VA conduction went from 1:1 to 2:1 without decrement.  AV nodal antegrade refractoriness could also not be determined because of atrial refractoriness occurring prior to AV nodal refractoriness despite double atrial extra stimuli.  ACCESSORY PATHWAY FUNCTION:  No eccentric conduction was identified. Retrograde conduction characteristics did not exclude an accessory pathway.  ARRHYTHMIAS INDUCED:  The patient presented to the lab in sinus rhythm. Electrogram mapping was utilized to map the flutter substrate.  Ventricular response programmed stimulation was normal for ventricular stimulation as described.  CATHETER ABLATION:  A total of 5 minutes and 21 seconds of RF energy was applied across the cavotricuspid isthmus resulting in degeneration of an A1, A2 interval of 140 milliseconds.  Bidirectional isthmus block was deferred.  Fluoroscopy time was 10.2 minutes.  RF energy is not available at the time of this dictation.  IMPRESSION: 1. Atrial flutter previously identified by electrocardiogram and     associated with a significant post termination pause for which     cavotricuspid isthmus conduction was presumed was ablated     successfully. 2. Sinus function normal. 3. Atrial function abnormal as noted above. 4. AV nodal function apart from  notable above that is to say     Wenckebach periodicity was not seen antegrade or retrograde prior     to refractoriness of the atrium antegrade and with 2:1 conduction     retrograde. 5. Normal His-Purkinje system function. 6. Normal response to ventricular stimulation.  SUMMARY OF CONCLUSION:  The results of  electrophysiological testing confirmed cavotricuspid isthmus conduction which was successfully eliminated with catheter ablation.  The patient will be observed overnight.  Coumadin anticoagulation will be continued for 1 week.  The patient tolerated the procedure without apparent complication.  COMPLICATIONS:  None noted although the anatomy was unusual with divergence of the left lower extremity venous circulation and the right lower extremity venous circulation at the level of or just below the liver.     Duke Salvia, MD, [facility]     SCK/MEDQ  D:  11/09/2012  T:  11/09/2012  Job:  [id number]

## 2012-11-09 NOTE — Op Note (Signed)
Ayodeji Keimig Nolet [id number]  [id number]  Preop Dx: atrial flutter  Postop Dx same/ cephalad joining of the right and left "femoral" veins at the level just below the  liver  Procedure:EPS RFCA   Cx: None   Dictation number [id number]  [name], MD 11/09/2012 9:39 AM

## 2012-11-09 NOTE — Progress Notes (Signed)
UR Completed Elanor Cale Graves-Bigelow, RN,BSN [phone]  

## 2012-11-09 NOTE — Anesthesia Postprocedure Evaluation (Signed)
  Anesthesia Post-op Note  Patient: [name]  Procedure(s) Performed: Procedure(s) (LRB) with comments: ATRIAL FLUTTER ABLATION (N/A)  Patient Location: PACU and Cath Lab  Anesthesia Type:General  Level of Consciousness: awake and alert   Airway and Oxygen Therapy: Patient Spontanous Breathing  Post-op Pain: none  Post-op Assessment: Post-op Vital signs reviewed, Patient's Cardiovascular Status Stable, Respiratory Function Stable, Patent Airway, No signs of Nausea or vomiting and Pain level controlled  Post-op Vital Signs: stable  Complications: No apparent anesthesia complications

## 2012-11-09 NOTE — Anesthesia Procedure Notes (Signed)
Procedure Name: LMA Insertion Date/Time: 11/09/2012 7:52 AM Performed by: Ellin Goodie Pre-anesthesia Checklist: Patient identified, Emergency Drugs available, Suction available, Patient being monitored and Timeout performed Patient Re-evaluated:Patient Re-evaluated prior to inductionOxygen Delivery Method: Circle system utilized Preoxygenation: Pre-oxygenation with 100% oxygen Intubation Type: IV induction LMA: LMA with gastric port inserted LMA Size: 5.0 Number of attempts: 1 Tube secured with: Tape Dental Injury: Teeth and Oropharynx as per pre-operative assessment

## 2012-11-09 NOTE — H&P (View-Only) (Signed)
ELECTROPHYSIOLOGY CONSULT NOTE    Patient ID: [name] MRN: [id number], DOB/AGE: [birth year] 51 y.o.  Admit date: 10/27/2012 Date of Consult: 10-28-2012  Primary Physician: Steven Hillier, MD Primary Cardiologist: [name]  Reason for Consultation:  Atrial flutter  HPI:  [name] is a 51 year old male who was admitted yesterday with atrial flutter and RVR. He converted on Cardizem with a 8 second post termination pause.  EP has been asked to evaluate for treatment options.  He has a history of coronary artery disease status post CABG (03-2012), stroke (thought to be from small vessel disease), chronic kidney disease (pending transplant in April-- son is donor), paroxysmal atrial fibrillation (post bypass).  He denies chest pain, shortness of breath.  He does report a prior history of syncope while he was in college and attributes it to ETOH consumption.  He had a sensation of facial flushing with his post termination pause.  He does not remember having this feeling before.  He endorses some tachycardia while exercising but not otherwise.    Stress echo in 01-2012 demonstrated an EF of 60% with mild MR.  Repeat echo this hospitalization is pending.   With history of prior stroke, vascular disease and hypertension, CHADS  score is 3.    Past Medical History  Diagnosis Date  . Hypertension   . CAD (coronary artery disease)     s/p CABG 03/2012: LIMA to LAD, free RIMA to OM1, SVG to D1, sequential SVG to AM and LPLB2, EVH via right thigh and leg.  . Elevated cholesterol   . Ventricular hypertrophy   . Gout   . Peripheral vascular disease   . Migraines     "last one 1990's"  . Stroke 1995; 2001    denies residual  . Cellulitis 04/13/2012    RLE saphenous vein harvest incision   . Hyperparathyroidism   . Superficial vein thrombosis     R greater saphenous 04/2012  . PAF and Flutter     Afib post-op CABG; AFlutter 11/13  . CKD (chronic kidney disease) stage 5, GFR less  than 15 ml/min     Diagnosed age 77, awaiting transplant  . Sinus node dysfunction-post termination pause     >8sec     Surgical History:  Past Surgical History  Procedure Date  . Cardiac catheterization 03/16/12  . Renal artery stent 2001  . Fracture surgery   . Av fistula placement 2011    left forearm  . Elbow fracture surgery 1972    left  . Hernia repair 09/2009    umbilical  . Inguinal hernia repair 09/2009    bilaterally  . Dental surgery 2008    multiple  . Coronary artery bypass graft 03/19/2012    Procedure: CORONARY ARTERY BYPASS GRAFTING (CABG);  Surgeon: [name], MD;  Location: [facility] OR;  Service: Open Heart Surgery;  Laterality: N/A;  (B) MAMMARY  . Av fistula placement      Prescriptions prior to admission  Medication Sig Dispense Refill  . allopurinol (ZYLOPRIM) 100 MG tablet Take 100 mg by mouth 2 (two) times daily.       Marland Kitchen aspirin EC 325 MG EC tablet Take 1 tablet (325 mg total) by mouth daily.  30 tablet    . calcitRIOL (ROCALTROL) 0.25 MCG capsule Take 0.25 mcg by mouth daily.       . calcium carbonate (TUMS EX) 750 MG chewable tablet Chew 2 tablets by mouth 3 (three) times daily. With  meals      . cloNIDine (CATAPRES) 0.2 MG tablet Take 0.2 mg by mouth daily.       . clopidogrel (PLAVIX) 75 MG tablet Take 75 mg by mouth daily. Stopped on 4-10 for procedure      . fenofibrate micronized (LOFIBRA) 134 MG capsule Take 134 mg by mouth daily before breakfast.      . furosemide (LASIX) 80 MG tablet Take 160 mg by mouth daily. Patient takes 160 mg in the AM and 80 mg in the PM      . metoprolol (LOPRESSOR) 50 MG tablet Take 50 mg by mouth 3 (three) times daily.       . nisoldipine (SULAR) 20 MG 24 hr tablet Take 20 mg by mouth 2 (two) times daily.      Marland Kitchen omega-3 acid ethyl esters (LOVAZA) 1 G capsule Take 2 g by mouth 2 (two) times daily.      . simvastatin (ZOCOR) 40 MG tablet Take 40 mg by mouth daily.      . Vitamin D, Ergocalciferol, (DRISDOL) 50000 UNITS  CAPS Take 50,000 Units by mouth every 7 (seven) days. Take Tuesday         Inpatient Medications:    . allopurinol  100 mg Oral BID  . [COMPLETED] aspirin  324 mg Oral Once  . aspirin EC  81 mg Oral Daily  . calcitRIOL  0.25 mcg Oral Daily  . calcium carbonate  3 tablet Oral TID WC  . cloNIDine  0.2 mg Oral QHS  . fenofibrate  160 mg Oral Daily  . furosemide  160 mg Oral Daily  . furosemide  80 mg Oral QPM  . metoprolol  50 mg Oral TID  . nisoldipine  17 mg Oral BID  . omega-3 acid ethyl esters  2 g Oral BID  . simvastatin  40 mg Oral Daily  . sodium chloride  3 mL Intravenous Q12H  . Vitamin D (Ergocalciferol)  50,000 Units Oral Q7 days  . Warfarin - Pharmacist Dosing Inpatient   Does not apply q1800    Allergies:  Allergies  Allergen Reactions  . Penicillins Itching and Rash    History   Social History  . Marital Status: Married    Spouse Name: N/A    Number of Children: N/A  . Years of Education: N/A   Occupational History  . Not on file.   Social History Main Topics  . Smoking status: Former Smoker -- 0.3 packs/day for 30 years    Types: Cigarettes    Quit date: 03/15/2012  . Smokeless tobacco: Never Used  . Alcohol Use: Yes     Comment: 04/13/12 "2 mixed drinks/month"  . Drug Use: No  . Sexually Active: Yes    Family History  Problem Relation Age of Onset  . Adopted: Yes    Review of Systems -as noted previously  Alert and oriented middle-aged Caucasian male in no acute distress HENT- normal Eyes- EOMI, without scleral icterus Skin- warm and dry; without rashes LN-neg Neck- supple without thyromegaly, JVP-flat, carotids brisk and full without bruits Back-without CVAT or kyphosis; healed median sternotomy Lungs-clear to auscultation CV-Regular rate and rhythm, nl S1 and S2, S4 with 2/6 systolic ejection murmur Abd-soft with active bowel sounds; no midline pulsation or hepatomegaly Pulses-intact femoral and distal MKS-without gross  deformity Neuro- Ax O, CN3-12 intact, grossly normal motor and sensory function Affect engaging    Labs:   Lab Results  Component Value Date   WBC  8.0 10/28/2012   HGB 11.5* 10/28/2012   HCT 34.5* 10/28/2012   MCV 86.7 10/28/2012   PLT 181 10/28/2012     Lab 10/28/12 0826 10/27/12 1045  NA 139 --  K 3.5 --  CL 101 --  CO2 18* --  BUN 141* --  CREATININE 10.82* --  CALCIUM 9.1 --  PROT -- 6.3  BILITOT -- 0.3  ALKPHOS -- 62  ALT -- 48  AST -- 30  GLUCOSE 161* --   Lab Results  Component Value Date   TROPONINI 6.24* 10/28/2012     Radiology/Studies: Dg Chest Portable 1 View 10/27/2012  *RADIOLOGY REPORT*  Clinical history:  Chest pain  PORTABLE CHEST - 1 VIEW  Comparison: Apr 10, 2012  Findings: Lungs clear.  Heart is mildly enlarged with normal pulmonary vascularity.  The patient is status post coronary artery bypass grafting.  No adenopathy.  No bone lesions.  IMPRESSION: Mild cardiac enlargement.  No edema or consolidation.   Original Report Authenticated By: Bretta Bang, M.D.    EKG: 10-27-2012 typical atrial flutter with variable conduction, sinus brady with PVC's post cardioversion  TELEMETRY: sinus rhythm with PVC's  Patient Active Hospital Problem List: CRF (chronic renal failure) (01/15/2012)   PAF and Aflutter (07/09/2012)     Sinus node dysfunction-post termination pause ()   Stroke ()  Elevated troponin      The patient has atrial flutter-typical with a profound posttermination pauses. The likelihood of recurrence of atrial flutter is thought to be very high, greater than 90% in its posttermination pauses are a consequence of that, especially to this magnitude, syncope is a concern. I would therefore favor catheter ablation of the atrial flutter substrate. This procedure has been reviewed extensively with the patient including but not limited to risks of heart block and perforation he understands these risks and is willing to proceed.  I worry  about the likelihood of recurrence of atrial fibrillation. In the setting of hypertensive heart disease atrial enlargement is likely and postoperative atrial fibrillation portends at some increased risk. Atrial fibrillation might well be associated with post termination pausing and at that point more definitive therapy i.e. pacing would need to be considered.  His renal insufficiency also informs thoughts regarding anticoagulation. Not withstanding the presumption that his strokes were related to small vessel disease, I think it is reasonable to consider with his history of atrial arrhythmias in the high risk for thromboembolism. The risk benefit of Coumadin in patients with severe renal insufficiency is clearly not as straightforward as in a normal renal function cohort with increased risks of bleeding and perhaps potentiated by Coumadin and increased risk of clotting with perhaps less benefit from Coumadin   Still recommendations are that the patient be treated with anticoagulation. Following renal transplantation he might be a candidate for NOAC.  With his troponin level of 7, this is likely related to rapid rates and impaired clearance related to renal insufficiency and is unlikely related to obstructive coronary disease. It is reasonable however to undertake Myoview scanning as you're planning

## 2012-11-09 NOTE — Anesthesia Preprocedure Evaluation (Addendum)
Anesthesia Evaluation  Patient identified by MRN, date of birth, ID band Patient awake    Reviewed: Allergy & Precautions, H&P , NPO status , Patient's Chart, lab work & pertinent test results, reviewed documented beta blocker date and time   Airway Mallampati: I TM Distance: >3 FB Neck ROM: Full    Dental  (+) Edentulous Upper   Pulmonary  breath sounds clear to auscultation  Pulmonary exam normal       Cardiovascular hypertension, Pt. on medications and Pt. on home beta blockers + CAD, + Past MI, + CABG and + Peripheral Vascular Disease + dysrhythmias Atrial Fibrillation Rhythm:Irregular Rate:Normal     Neuro/Psych  Headaches, CVA, No Residual Symptoms    GI/Hepatic   Endo/Other    Renal/GU ESRFRenal disease     Musculoskeletal   Abdominal   Peds  Hematology   Anesthesia Other Findings   Reproductive/Obstetrics                          Anesthesia Physical Anesthesia Plan  ASA: III  Anesthesia Plan: MAC   Post-op Pain Management:    Induction: Intravenous  Airway Management Planned: Mask  Additional Equipment:   Intra-op Plan:   Post-operative Plan:   Informed Consent: I have reviewed the patients History and Physical, chart, labs and discussed the procedure including the risks, benefits and alternatives for the proposed anesthesia with the patient or authorized representative who has indicated his/her understanding and acceptance.     Plan Discussed with: CRNA and Surgeon  Anesthesia Plan Comments:         Anesthesia Quick Evaluation

## 2012-11-09 NOTE — Discharge Summary (Signed)
Patient ID: [name],  MRN: [id number], DOB/AGE: 1961-09-13 51 y.o.  Admit date: 11/09/2012 Discharge date: 11/09/2012  Primary Care Provider: Mickie Hillier Primary Cardiologist: Odessa Fleming, MD  Discharge Diagnoses Principal Problem:  *Atrial flutter   **S/P EP study and atrial flutter ablation this admission.  **Cont coumadin for one more week (stop 11/16/12) Active Problems:  CAD (coronary artery disease) of artery bypass graft  HTN (hypertension)  CRF (chronic renal failure)  **Pending renal transplant.  Cephalad joining of the right and left "femoral" veins at the level just below the liver.  **Noted during EPS.  Allergies Allergies  Allergen Reactions  . Penicillins Itching and Rash    Procedures  11/09/2012  Electrophysiology Study and Atrial Flutter Radiofrequency Catheter Ablation  History of Present Illness  51 y/o male with h/o CAD, atrial flutter, and post-termination pauses.  He was recently admitted to [facility] with aflutter and RVR followed by a post-termination pause of 8.4 seconds.  During that admission, pt ruled in for NSTEMI.  Cath was not pursued 2/2 known renal failure.  He was seen by Electrophysiology for consideration of catheter ablation and this was arranged to be performed as an outpt.  Pt was started on coumadin and discharged home 11/26.  Following discharge, he underwent lexiscan myoview, which showed no ischemia.  He returned to [facility] on 12/9 for EP study and catheter ablation of atrial flutter.  Hospital Course  Pt presented to the [facility] EP lab.  He underwent EP study with incidental finding of cephalad joining of the right and left "femoral" veins at the level just below the liver.  EP study was otherwise carried out in a normal fashion and atrial flutter was subsequently successfully ablated.  He tolerated this procedure well and post-procedure has maintained sinus rhythm.  He will be discharged home this evening in good condition.  He will  continue on Coumadin at 2.5mg  daily for one week then can stop.  Discharge Vitals Blood pressure 104/60, pulse 56, temperature 97.3 F (36.3 C), temperature source Oral, resp. rate 20, height 5\' 8"  (1.727 m), weight 197 lb (89.359 kg), SpO2 98.00%.  Filed Weights   11/09/12 0543  Weight: 197 lb (89.359 kg)   Labs  None  Disposition  Pt is being discharged home today in good condition.  Follow-up Plans & Appointments      Follow-up Information    Follow up with [name], MD. On 12/09/2012. (10:00)    Contact information:   [address] [place] [address] [phone]        Discharge Medications    Medication List     As of 11/09/2012  6:22 PM    TAKE these medications         allopurinol 100 MG tablet   Commonly known as: ZYLOPRIM   Take 100 mg by mouth 2 (two) times daily.      aspirin 81 MG EC tablet   Take 1 tablet (81 mg total) by mouth daily.      calcitRIOL 0.25 MCG capsule   Commonly known as: ROCALTROL   Take 0.25 mcg by mouth daily.      calcium carbonate 750 MG chewable tablet   Commonly known as: TUMS EX   Chew 2 tablets by mouth 3 (three) times daily. With meals      cloNIDine 0.2 MG tablet   Commonly known as: CATAPRES   Take 0.2 mg by mouth daily.      fenofibrate micronized  134 MG capsule   Commonly known as: LOFIBRA   Take 134 mg by mouth daily before breakfast.      furosemide 80 MG tablet   Commonly known as: LASIX   Take 160 mg by mouth every morning.      LOVAZA 1 G capsule   Generic drug: omega-3 acid ethyl esters   Take 2 g by mouth 2 (two) times daily.      metoprolol 50 MG tablet   Commonly known as: LOPRESSOR   Take 0.5 tablets (25 mg total) by mouth 3 (three) times daily.      nisoldipine 20 MG 24 hr tablet   Commonly known as: SULAR   Take 20 mg by mouth 2 (two) times daily.      nitroGLYCERIN 0.4 MG SL tablet   Commonly known as: NITROSTAT   Place 0.4 mg under the tongue every 5 (five)  minutes x 3 doses as needed. For chest pain      simvastatin 40 MG tablet   Commonly known as: ZOCOR   Take 40 mg by mouth daily.      Vitamin D (Ergocalciferol) 50000 UNITS Caps   Commonly known as: DRISDOL   Take 50,000 Units by mouth every 7 (seven) days. Take Tuesday      warfarin 5 MG tablet   Commonly known as: COUMADIN   Take 0.5 tablets (2.5 mg total) by mouth daily. Please take for one more week and stop on 11/16/12.        Outstanding Labs/Studies INR  Duration of Discharge Encounter   Greater than 30 minutes including physician time.  Signed, Nicolasa Ducking NP 11/09/2012, 4:06 PM    Addended by Berton Mount, PA-C 11/09/12 1820

## 2012-11-11 ENCOUNTER — Encounter (HOSPITAL_COMMUNITY): Admission: RE | Admit: 2012-11-11 | Payer: 59 | Source: Ambulatory Visit

## 2012-11-12 ENCOUNTER — Ambulatory Visit (INDEPENDENT_AMBULATORY_CARE_PROVIDER_SITE_OTHER): Payer: 59 | Admitting: *Deleted

## 2012-11-12 DIAGNOSIS — Z7901 Long term (current) use of anticoagulants: Secondary | ICD-10-CM

## 2012-11-12 DIAGNOSIS — I639 Cerebral infarction, unspecified: Secondary | ICD-10-CM

## 2012-11-12 DIAGNOSIS — I480 Paroxysmal atrial fibrillation: Secondary | ICD-10-CM

## 2012-11-12 DIAGNOSIS — I6350 Cerebral infarction due to unspecified occlusion or stenosis of unspecified cerebral artery: Secondary | ICD-10-CM

## 2012-11-12 DIAGNOSIS — I25810 Atherosclerosis of coronary artery bypass graft(s) without angina pectoris: Secondary | ICD-10-CM

## 2012-11-12 DIAGNOSIS — I4891 Unspecified atrial fibrillation: Secondary | ICD-10-CM

## 2012-11-12 DIAGNOSIS — I214 Non-ST elevation (NSTEMI) myocardial infarction: Secondary | ICD-10-CM

## 2012-11-12 LAB — POCT INR: INR: 4.6

## 2012-11-13 ENCOUNTER — Encounter (HOSPITAL_COMMUNITY): Payer: 59

## 2012-11-16 ENCOUNTER — Encounter (HOSPITAL_COMMUNITY): Payer: 59

## 2012-11-18 ENCOUNTER — Encounter (HOSPITAL_COMMUNITY): Payer: 59

## 2012-11-20 ENCOUNTER — Encounter (HOSPITAL_COMMUNITY): Payer: 59

## 2012-11-23 ENCOUNTER — Encounter (HOSPITAL_COMMUNITY): Payer: 59

## 2012-11-27 ENCOUNTER — Encounter (HOSPITAL_COMMUNITY): Payer: 59

## 2012-11-30 ENCOUNTER — Encounter (HOSPITAL_COMMUNITY): Payer: 59

## 2012-12-04 ENCOUNTER — Encounter (HOSPITAL_COMMUNITY): Payer: 59

## 2012-12-07 ENCOUNTER — Encounter (HOSPITAL_COMMUNITY): Payer: 59

## 2012-12-09 ENCOUNTER — Encounter (HOSPITAL_COMMUNITY): Payer: 59

## 2012-12-09 ENCOUNTER — Encounter: Payer: 59 | Admitting: Internal Medicine

## 2012-12-11 ENCOUNTER — Encounter (HOSPITAL_COMMUNITY): Payer: 59

## 2012-12-14 ENCOUNTER — Encounter (HOSPITAL_COMMUNITY): Payer: 59

## 2012-12-16 ENCOUNTER — Encounter (HOSPITAL_COMMUNITY): Payer: 59

## 2012-12-16 ENCOUNTER — Encounter: Payer: Self-pay | Admitting: Internal Medicine

## 2012-12-18 ENCOUNTER — Encounter (HOSPITAL_COMMUNITY): Payer: 59

## 2012-12-21 ENCOUNTER — Encounter (HOSPITAL_COMMUNITY): Payer: 59

## 2012-12-23 ENCOUNTER — Encounter (HOSPITAL_COMMUNITY): Payer: 59

## 2012-12-25 ENCOUNTER — Encounter (HOSPITAL_COMMUNITY): Payer: 59

## 2012-12-28 ENCOUNTER — Encounter: Payer: 59 | Admitting: Internal Medicine

## 2012-12-28 ENCOUNTER — Encounter (HOSPITAL_COMMUNITY): Payer: 59

## 2012-12-30 ENCOUNTER — Encounter (HOSPITAL_COMMUNITY): Payer: 59

## 2013-01-01 ENCOUNTER — Encounter (HOSPITAL_COMMUNITY): Payer: 59

## 2013-01-01 ENCOUNTER — Telehealth: Payer: Self-pay | Admitting: Cardiovascular Disease

## 2013-01-01 ENCOUNTER — Encounter: Payer: Self-pay | Admitting: *Deleted

## 2013-01-01 NOTE — Telephone Encounter (Signed)
SEE LETTERS TAB ,NOTE FAXED TO  LISTED FAX NUMBER CLEARING PT FOR PROCEDURE .Zack Seal

## 2013-01-01 NOTE — Telephone Encounter (Signed)
Pt needs kidney transplant, they have records but needs letter from dr Eden Emms stating pt cleared for surgery from a cardiology standpoint faxed to [phone]

## 2013-01-01 NOTE — Telephone Encounter (Signed)
Ok for transplant Had CABG 4/13 Had normal EF by echo 60-65% 11/13 Had nonischemic myovue 12/13

## 2013-01-04 ENCOUNTER — Encounter (HOSPITAL_COMMUNITY): Payer: 59

## 2013-01-06 ENCOUNTER — Encounter (HOSPITAL_COMMUNITY): Payer: 59

## 2013-01-08 ENCOUNTER — Encounter (HOSPITAL_COMMUNITY): Payer: 59

## 2013-01-11 ENCOUNTER — Encounter (HOSPITAL_COMMUNITY): Payer: 59

## 2013-01-13 ENCOUNTER — Encounter (HOSPITAL_COMMUNITY): Payer: 59

## 2013-01-15 ENCOUNTER — Encounter (HOSPITAL_COMMUNITY): Payer: 59

## 2013-02-02 ENCOUNTER — Other Ambulatory Visit (HOSPITAL_COMMUNITY): Payer: Self-pay | Admitting: Nephrology

## 2013-02-02 DIAGNOSIS — Z949 Transplanted organ and tissue status, unspecified: Secondary | ICD-10-CM

## 2013-02-10 ENCOUNTER — Ambulatory Visit (HOSPITAL_COMMUNITY)
Admission: RE | Admit: 2013-02-10 | Discharge: 2013-02-10 | Disposition: A | Discharge status: Home / Self Care | Payer: 59 | Source: Ambulatory Visit | Attending: Nephrology | Admitting: Nephrology

## 2013-02-10 DIAGNOSIS — Z01810 Encounter for preprocedural cardiovascular examination: Secondary | ICD-10-CM | POA: Insufficient documentation

## 2013-02-10 DIAGNOSIS — Z949 Transplanted organ and tissue status, unspecified: Secondary | ICD-10-CM

## 2013-02-10 NOTE — Progress Notes (Signed)
VASCULAR LAB PRELIMINARY  ARTERIAL  ABI completed:    RIGHT    LEFT    PRESSURE WAVEFORM  PRESSURE WAVEFORM  BRACHIAL 140 Triphasic BRACHIAL A - V fistula   DP 150 Triphasic DP 160 Triphasic         PT 175 Triphasic PT 177 Triphasic                  RIGHT LEFT  ABI 1.25 1.26    ABIs and Doppler waveforms are normal bilaterally  SLAUGHTER, [place], RVS 02/10/2013, 11:26 AM

## 2013-03-12 ENCOUNTER — Ambulatory Visit: Payer: 59 | Admitting: Cardiovascular Disease

## 2013-03-22 DIAGNOSIS — N186 End stage renal disease: Secondary | ICD-10-CM | POA: Diagnosis not present

## 2013-03-22 DIAGNOSIS — Z01818 Encounter for other preprocedural examination: Secondary | ICD-10-CM | POA: Diagnosis not present

## 2013-03-23 DIAGNOSIS — N186 End stage renal disease: Secondary | ICD-10-CM | POA: Diagnosis not present

## 2013-03-23 DIAGNOSIS — Z940 Kidney transplant status: Secondary | ICD-10-CM | POA: Diagnosis not present

## 2013-03-23 DIAGNOSIS — Z48298 Encounter for aftercare following other organ transplant: Secondary | ICD-10-CM | POA: Diagnosis not present

## 2013-03-24 DIAGNOSIS — Z79899 Other long term (current) drug therapy: Secondary | ICD-10-CM | POA: Diagnosis not present

## 2013-03-24 DIAGNOSIS — Z5181 Encounter for therapeutic drug level monitoring: Secondary | ICD-10-CM | POA: Diagnosis not present

## 2013-03-24 DIAGNOSIS — N186 End stage renal disease: Secondary | ICD-10-CM | POA: Diagnosis not present

## 2013-03-24 DIAGNOSIS — Z940 Kidney transplant status: Secondary | ICD-10-CM | POA: Diagnosis not present

## 2013-03-24 DIAGNOSIS — I129 Hypertensive chronic kidney disease with stage 1 through stage 4 chronic kidney disease, or unspecified chronic kidney disease: Secondary | ICD-10-CM | POA: Diagnosis not present

## 2013-03-24 DIAGNOSIS — Z48298 Encounter for aftercare following other organ transplant: Secondary | ICD-10-CM | POA: Diagnosis not present

## 2013-03-25 DIAGNOSIS — Z48298 Encounter for aftercare following other organ transplant: Secondary | ICD-10-CM | POA: Diagnosis not present

## 2013-03-25 DIAGNOSIS — I129 Hypertensive chronic kidney disease with stage 1 through stage 4 chronic kidney disease, or unspecified chronic kidney disease: Secondary | ICD-10-CM | POA: Diagnosis not present

## 2013-03-25 DIAGNOSIS — Z940 Kidney transplant status: Secondary | ICD-10-CM | POA: Diagnosis not present

## 2013-03-25 DIAGNOSIS — Z5181 Encounter for therapeutic drug level monitoring: Secondary | ICD-10-CM | POA: Diagnosis not present

## 2013-03-25 DIAGNOSIS — Z79899 Other long term (current) drug therapy: Secondary | ICD-10-CM | POA: Diagnosis not present

## 2013-03-26 DIAGNOSIS — Z940 Kidney transplant status: Secondary | ICD-10-CM | POA: Diagnosis not present

## 2013-03-26 DIAGNOSIS — Z48298 Encounter for aftercare following other organ transplant: Secondary | ICD-10-CM | POA: Diagnosis not present

## 2013-03-26 DIAGNOSIS — D6959 Other secondary thrombocytopenia: Secondary | ICD-10-CM | POA: Diagnosis not present

## 2013-03-26 DIAGNOSIS — Z79899 Other long term (current) drug therapy: Secondary | ICD-10-CM | POA: Diagnosis not present

## 2013-03-26 DIAGNOSIS — R7309 Other abnormal glucose: Secondary | ICD-10-CM | POA: Diagnosis not present

## 2013-03-26 DIAGNOSIS — N2581 Secondary hyperparathyroidism of renal origin: Secondary | ICD-10-CM | POA: Diagnosis not present

## 2013-03-26 DIAGNOSIS — Z5181 Encounter for therapeutic drug level monitoring: Secondary | ICD-10-CM | POA: Diagnosis not present

## 2013-03-27 DIAGNOSIS — Z48298 Encounter for aftercare following other organ transplant: Secondary | ICD-10-CM | POA: Diagnosis not present

## 2013-03-27 DIAGNOSIS — Z5181 Encounter for therapeutic drug level monitoring: Secondary | ICD-10-CM | POA: Diagnosis not present

## 2013-03-27 DIAGNOSIS — N2581 Secondary hyperparathyroidism of renal origin: Secondary | ICD-10-CM | POA: Diagnosis not present

## 2013-03-27 DIAGNOSIS — Z79899 Other long term (current) drug therapy: Secondary | ICD-10-CM | POA: Diagnosis not present

## 2013-03-27 DIAGNOSIS — D6959 Other secondary thrombocytopenia: Secondary | ICD-10-CM | POA: Diagnosis not present

## 2013-03-27 DIAGNOSIS — Z940 Kidney transplant status: Secondary | ICD-10-CM | POA: Diagnosis not present

## 2013-03-27 DIAGNOSIS — R7309 Other abnormal glucose: Secondary | ICD-10-CM | POA: Diagnosis not present

## 2013-03-30 DIAGNOSIS — D849 Immunodeficiency, unspecified: Secondary | ICD-10-CM | POA: Insufficient documentation

## 2013-04-02 DIAGNOSIS — Z48298 Encounter for aftercare following other organ transplant: Secondary | ICD-10-CM | POA: Diagnosis not present

## 2013-04-02 DIAGNOSIS — Z79899 Other long term (current) drug therapy: Secondary | ICD-10-CM | POA: Diagnosis not present

## 2013-04-02 DIAGNOSIS — Z5181 Encounter for therapeutic drug level monitoring: Secondary | ICD-10-CM | POA: Diagnosis not present

## 2013-04-02 DIAGNOSIS — Z940 Kidney transplant status: Secondary | ICD-10-CM | POA: Diagnosis not present

## 2013-04-03 DIAGNOSIS — Z5181 Encounter for therapeutic drug level monitoring: Secondary | ICD-10-CM | POA: Diagnosis not present

## 2013-04-03 DIAGNOSIS — Z940 Kidney transplant status: Secondary | ICD-10-CM | POA: Diagnosis not present

## 2013-04-03 DIAGNOSIS — Z79899 Other long term (current) drug therapy: Secondary | ICD-10-CM | POA: Diagnosis not present

## 2013-04-03 DIAGNOSIS — Z48298 Encounter for aftercare following other organ transplant: Secondary | ICD-10-CM | POA: Diagnosis not present

## 2013-04-04 DIAGNOSIS — Z5181 Encounter for therapeutic drug level monitoring: Secondary | ICD-10-CM | POA: Diagnosis not present

## 2013-04-04 DIAGNOSIS — Z79899 Other long term (current) drug therapy: Secondary | ICD-10-CM | POA: Diagnosis not present

## 2013-04-04 DIAGNOSIS — Z940 Kidney transplant status: Secondary | ICD-10-CM | POA: Diagnosis not present

## 2013-04-04 DIAGNOSIS — Z48298 Encounter for aftercare following other organ transplant: Secondary | ICD-10-CM | POA: Diagnosis not present

## 2013-04-05 DIAGNOSIS — Z940 Kidney transplant status: Secondary | ICD-10-CM | POA: Diagnosis not present

## 2013-04-05 DIAGNOSIS — Z79899 Other long term (current) drug therapy: Secondary | ICD-10-CM | POA: Diagnosis not present

## 2013-04-05 DIAGNOSIS — Z48298 Encounter for aftercare following other organ transplant: Secondary | ICD-10-CM | POA: Diagnosis not present

## 2013-04-05 DIAGNOSIS — Z5181 Encounter for therapeutic drug level monitoring: Secondary | ICD-10-CM | POA: Diagnosis not present

## 2013-04-07 DIAGNOSIS — T8131XA Disruption of external operation (surgical) wound, not elsewhere classified, initial encounter: Secondary | ICD-10-CM | POA: Diagnosis not present

## 2013-04-07 DIAGNOSIS — F17200 Nicotine dependence, unspecified, uncomplicated: Secondary | ICD-10-CM | POA: Diagnosis not present

## 2013-04-08 ENCOUNTER — Ambulatory Visit: Payer: 59 | Admitting: Cardiovascular Disease

## 2013-04-08 DIAGNOSIS — D899 Disorder involving the immune mechanism, unspecified: Secondary | ICD-10-CM | POA: Diagnosis not present

## 2013-04-08 DIAGNOSIS — Z951 Presence of aortocoronary bypass graft: Secondary | ICD-10-CM | POA: Diagnosis not present

## 2013-04-08 DIAGNOSIS — Z940 Kidney transplant status: Secondary | ICD-10-CM | POA: Diagnosis not present

## 2013-04-08 DIAGNOSIS — I2510 Atherosclerotic heart disease of native coronary artery without angina pectoris: Secondary | ICD-10-CM | POA: Diagnosis not present

## 2013-04-08 DIAGNOSIS — I10 Essential (primary) hypertension: Secondary | ICD-10-CM | POA: Diagnosis not present

## 2013-04-20 DIAGNOSIS — Z48298 Encounter for aftercare following other organ transplant: Secondary | ICD-10-CM | POA: Diagnosis not present

## 2013-04-20 DIAGNOSIS — Z940 Kidney transplant status: Secondary | ICD-10-CM | POA: Diagnosis not present

## 2013-04-27 DIAGNOSIS — R9389 Abnormal findings on diagnostic imaging of other specified body structures: Secondary | ICD-10-CM | POA: Diagnosis not present

## 2013-04-27 DIAGNOSIS — I10 Essential (primary) hypertension: Secondary | ICD-10-CM | POA: Diagnosis not present

## 2013-04-27 DIAGNOSIS — R93429 Abnormal radiologic findings on diagnostic imaging of unspecified kidney: Secondary | ICD-10-CM | POA: Insufficient documentation

## 2013-04-27 DIAGNOSIS — D899 Disorder involving the immune mechanism, unspecified: Secondary | ICD-10-CM | POA: Diagnosis not present

## 2013-04-27 DIAGNOSIS — Z951 Presence of aortocoronary bypass graft: Secondary | ICD-10-CM | POA: Diagnosis not present

## 2013-04-27 DIAGNOSIS — Z940 Kidney transplant status: Secondary | ICD-10-CM | POA: Diagnosis not present

## 2013-05-04 DIAGNOSIS — E785 Hyperlipidemia, unspecified: Secondary | ICD-10-CM | POA: Diagnosis not present

## 2013-05-04 DIAGNOSIS — I10 Essential (primary) hypertension: Secondary | ICD-10-CM | POA: Diagnosis not present

## 2013-05-04 DIAGNOSIS — D899 Disorder involving the immune mechanism, unspecified: Secondary | ICD-10-CM | POA: Diagnosis not present

## 2013-05-04 DIAGNOSIS — M25429 Effusion, unspecified elbow: Secondary | ICD-10-CM | POA: Diagnosis not present

## 2013-05-04 DIAGNOSIS — Z940 Kidney transplant status: Secondary | ICD-10-CM | POA: Diagnosis not present

## 2013-05-04 DIAGNOSIS — R9389 Abnormal findings on diagnostic imaging of other specified body structures: Secondary | ICD-10-CM | POA: Diagnosis not present

## 2013-05-04 DIAGNOSIS — I6350 Cerebral infarction due to unspecified occlusion or stenosis of unspecified cerebral artery: Secondary | ICD-10-CM | POA: Diagnosis not present

## 2013-05-04 DIAGNOSIS — M25421 Effusion, right elbow: Secondary | ICD-10-CM | POA: Insufficient documentation

## 2013-05-04 DIAGNOSIS — Z951 Presence of aortocoronary bypass graft: Secondary | ICD-10-CM | POA: Diagnosis not present

## 2013-05-06 DIAGNOSIS — Z48298 Encounter for aftercare following other organ transplant: Secondary | ICD-10-CM | POA: Diagnosis not present

## 2013-05-06 DIAGNOSIS — Z940 Kidney transplant status: Secondary | ICD-10-CM | POA: Diagnosis not present

## 2013-05-10 DIAGNOSIS — E785 Hyperlipidemia, unspecified: Secondary | ICD-10-CM | POA: Diagnosis not present

## 2013-05-10 DIAGNOSIS — Z48298 Encounter for aftercare following other organ transplant: Secondary | ICD-10-CM | POA: Diagnosis not present

## 2013-05-10 DIAGNOSIS — Z940 Kidney transplant status: Secondary | ICD-10-CM | POA: Diagnosis not present

## 2013-05-10 DIAGNOSIS — I10 Essential (primary) hypertension: Secondary | ICD-10-CM | POA: Diagnosis not present

## 2013-05-10 DIAGNOSIS — D899 Disorder involving the immune mechanism, unspecified: Secondary | ICD-10-CM | POA: Diagnosis not present

## 2013-05-10 DIAGNOSIS — N390 Urinary tract infection, site not specified: Secondary | ICD-10-CM | POA: Diagnosis not present

## 2013-05-14 DIAGNOSIS — N179 Acute kidney failure, unspecified: Secondary | ICD-10-CM | POA: Diagnosis not present

## 2013-05-14 DIAGNOSIS — Z940 Kidney transplant status: Secondary | ICD-10-CM | POA: Diagnosis not present

## 2013-05-14 DIAGNOSIS — Z79899 Other long term (current) drug therapy: Secondary | ICD-10-CM | POA: Diagnosis not present

## 2013-05-14 DIAGNOSIS — R7989 Other specified abnormal findings of blood chemistry: Secondary | ICD-10-CM | POA: Insufficient documentation

## 2013-05-14 DIAGNOSIS — Z48298 Encounter for aftercare following other organ transplant: Secondary | ICD-10-CM | POA: Diagnosis not present

## 2013-05-14 DIAGNOSIS — Z5181 Encounter for therapeutic drug level monitoring: Secondary | ICD-10-CM | POA: Diagnosis not present

## 2013-05-15 DIAGNOSIS — Z5181 Encounter for therapeutic drug level monitoring: Secondary | ICD-10-CM | POA: Diagnosis not present

## 2013-05-15 DIAGNOSIS — R918 Other nonspecific abnormal finding of lung field: Secondary | ICD-10-CM | POA: Diagnosis not present

## 2013-05-15 DIAGNOSIS — Z940 Kidney transplant status: Secondary | ICD-10-CM | POA: Diagnosis not present

## 2013-05-15 DIAGNOSIS — Z79899 Other long term (current) drug therapy: Secondary | ICD-10-CM | POA: Diagnosis not present

## 2013-05-15 DIAGNOSIS — R599 Enlarged lymph nodes, unspecified: Secondary | ICD-10-CM | POA: Diagnosis not present

## 2013-05-15 DIAGNOSIS — J90 Pleural effusion, not elsewhere classified: Secondary | ICD-10-CM | POA: Diagnosis not present

## 2013-05-15 DIAGNOSIS — Z48298 Encounter for aftercare following other organ transplant: Secondary | ICD-10-CM | POA: Diagnosis not present

## 2013-05-16 DIAGNOSIS — N179 Acute kidney failure, unspecified: Secondary | ICD-10-CM | POA: Diagnosis not present

## 2013-05-16 DIAGNOSIS — Z940 Kidney transplant status: Secondary | ICD-10-CM | POA: Diagnosis not present

## 2013-05-16 DIAGNOSIS — Z79899 Other long term (current) drug therapy: Secondary | ICD-10-CM | POA: Diagnosis not present

## 2013-05-16 DIAGNOSIS — Z5181 Encounter for therapeutic drug level monitoring: Secondary | ICD-10-CM | POA: Diagnosis not present

## 2013-05-16 DIAGNOSIS — J90 Pleural effusion, not elsewhere classified: Secondary | ICD-10-CM | POA: Diagnosis not present

## 2013-05-16 DIAGNOSIS — Z48298 Encounter for aftercare following other organ transplant: Secondary | ICD-10-CM | POA: Diagnosis not present

## 2013-05-17 DIAGNOSIS — Z5181 Encounter for therapeutic drug level monitoring: Secondary | ICD-10-CM | POA: Diagnosis not present

## 2013-05-17 DIAGNOSIS — Z940 Kidney transplant status: Secondary | ICD-10-CM | POA: Diagnosis not present

## 2013-05-17 DIAGNOSIS — N179 Acute kidney failure, unspecified: Secondary | ICD-10-CM | POA: Diagnosis not present

## 2013-05-17 DIAGNOSIS — Z79899 Other long term (current) drug therapy: Secondary | ICD-10-CM | POA: Diagnosis not present

## 2013-05-17 DIAGNOSIS — Z48298 Encounter for aftercare following other organ transplant: Secondary | ICD-10-CM | POA: Diagnosis not present

## 2013-05-18 DIAGNOSIS — Z5181 Encounter for therapeutic drug level monitoring: Secondary | ICD-10-CM | POA: Diagnosis not present

## 2013-05-18 DIAGNOSIS — Z48298 Encounter for aftercare following other organ transplant: Secondary | ICD-10-CM | POA: Diagnosis not present

## 2013-05-18 DIAGNOSIS — Z79899 Other long term (current) drug therapy: Secondary | ICD-10-CM | POA: Diagnosis not present

## 2013-05-18 DIAGNOSIS — Z940 Kidney transplant status: Secondary | ICD-10-CM | POA: Diagnosis not present

## 2013-05-19 DIAGNOSIS — Z48298 Encounter for aftercare following other organ transplant: Secondary | ICD-10-CM | POA: Diagnosis not present

## 2013-05-19 DIAGNOSIS — Z5181 Encounter for therapeutic drug level monitoring: Secondary | ICD-10-CM | POA: Diagnosis not present

## 2013-05-19 DIAGNOSIS — Z79899 Other long term (current) drug therapy: Secondary | ICD-10-CM | POA: Diagnosis not present

## 2013-05-19 DIAGNOSIS — I517 Cardiomegaly: Secondary | ICD-10-CM | POA: Diagnosis not present

## 2013-05-19 DIAGNOSIS — Z940 Kidney transplant status: Secondary | ICD-10-CM | POA: Diagnosis not present

## 2013-05-19 DIAGNOSIS — T8610 Unspecified complication of kidney transplant: Secondary | ICD-10-CM | POA: Diagnosis not present

## 2013-05-20 DIAGNOSIS — Z5181 Encounter for therapeutic drug level monitoring: Secondary | ICD-10-CM | POA: Diagnosis not present

## 2013-05-20 DIAGNOSIS — Z940 Kidney transplant status: Secondary | ICD-10-CM | POA: Diagnosis not present

## 2013-05-20 DIAGNOSIS — Z48298 Encounter for aftercare following other organ transplant: Secondary | ICD-10-CM | POA: Diagnosis not present

## 2013-05-20 DIAGNOSIS — Z79899 Other long term (current) drug therapy: Secondary | ICD-10-CM | POA: Diagnosis not present

## 2013-05-21 DIAGNOSIS — Z79899 Other long term (current) drug therapy: Secondary | ICD-10-CM | POA: Diagnosis not present

## 2013-05-21 DIAGNOSIS — Z48298 Encounter for aftercare following other organ transplant: Secondary | ICD-10-CM | POA: Diagnosis not present

## 2013-05-21 DIAGNOSIS — Z940 Kidney transplant status: Secondary | ICD-10-CM | POA: Diagnosis not present

## 2013-05-21 DIAGNOSIS — Z5181 Encounter for therapeutic drug level monitoring: Secondary | ICD-10-CM | POA: Diagnosis not present

## 2013-05-24 DIAGNOSIS — T8611 Kidney transplant rejection: Secondary | ICD-10-CM | POA: Insufficient documentation

## 2013-05-26 ENCOUNTER — Ambulatory Visit: Payer: 59 | Admitting: Cardiovascular Disease

## 2013-06-24 DIAGNOSIS — Z940 Kidney transplant status: Secondary | ICD-10-CM | POA: Diagnosis not present

## 2013-06-24 DIAGNOSIS — T8610 Unspecified complication of kidney transplant: Secondary | ICD-10-CM | POA: Diagnosis not present

## 2013-07-06 DIAGNOSIS — Z940 Kidney transplant status: Secondary | ICD-10-CM | POA: Diagnosis not present

## 2013-07-06 DIAGNOSIS — Z951 Presence of aortocoronary bypass graft: Secondary | ICD-10-CM | POA: Diagnosis not present

## 2013-07-06 DIAGNOSIS — Z79899 Other long term (current) drug therapy: Secondary | ICD-10-CM | POA: Diagnosis not present

## 2013-07-06 DIAGNOSIS — Z48298 Encounter for aftercare following other organ transplant: Secondary | ICD-10-CM | POA: Diagnosis not present

## 2013-07-06 DIAGNOSIS — T8610 Unspecified complication of kidney transplant: Secondary | ICD-10-CM | POA: Diagnosis not present

## 2013-07-13 DIAGNOSIS — Z48298 Encounter for aftercare following other organ transplant: Secondary | ICD-10-CM | POA: Diagnosis not present

## 2013-07-13 DIAGNOSIS — Z940 Kidney transplant status: Secondary | ICD-10-CM | POA: Diagnosis not present

## 2013-07-20 ENCOUNTER — Other Ambulatory Visit: Payer: Self-pay

## 2013-07-20 ENCOUNTER — Other Ambulatory Visit (HOSPITAL_COMMUNITY): Payer: Self-pay | Admitting: *Deleted

## 2013-07-20 DIAGNOSIS — Z940 Kidney transplant status: Secondary | ICD-10-CM | POA: Diagnosis not present

## 2013-07-20 DIAGNOSIS — I10 Essential (primary) hypertension: Secondary | ICD-10-CM | POA: Diagnosis not present

## 2013-07-20 DIAGNOSIS — D899 Disorder involving the immune mechanism, unspecified: Secondary | ICD-10-CM | POA: Diagnosis not present

## 2013-07-20 DIAGNOSIS — R109 Unspecified abdominal pain: Secondary | ICD-10-CM

## 2013-07-20 DIAGNOSIS — Z48298 Encounter for aftercare following other organ transplant: Secondary | ICD-10-CM | POA: Diagnosis not present

## 2013-07-21 ENCOUNTER — Ambulatory Visit (HOSPITAL_COMMUNITY)
Admission: RE | Admit: 2013-07-21 | Discharge: 2013-07-21 | Disposition: A | Discharge status: Home / Self Care | Payer: 59 | Source: Ambulatory Visit | Attending: Cardiovascular Disease | Admitting: Cardiovascular Disease

## 2013-07-21 DIAGNOSIS — I517 Cardiomegaly: Secondary | ICD-10-CM | POA: Insufficient documentation

## 2013-07-21 DIAGNOSIS — R109 Unspecified abdominal pain: Secondary | ICD-10-CM

## 2013-07-21 DIAGNOSIS — N269 Renal sclerosis, unspecified: Secondary | ICD-10-CM | POA: Insufficient documentation

## 2013-07-21 DIAGNOSIS — Q619 Cystic kidney disease, unspecified: Secondary | ICD-10-CM | POA: Insufficient documentation

## 2013-07-21 DIAGNOSIS — Z940 Kidney transplant status: Secondary | ICD-10-CM | POA: Insufficient documentation

## 2013-07-27 DIAGNOSIS — Z48298 Encounter for aftercare following other organ transplant: Secondary | ICD-10-CM | POA: Diagnosis not present

## 2013-07-27 DIAGNOSIS — Z940 Kidney transplant status: Secondary | ICD-10-CM | POA: Diagnosis not present

## 2013-08-12 DIAGNOSIS — Z940 Kidney transplant status: Secondary | ICD-10-CM | POA: Diagnosis not present

## 2013-08-12 DIAGNOSIS — R799 Abnormal finding of blood chemistry, unspecified: Secondary | ICD-10-CM | POA: Diagnosis not present

## 2013-08-12 DIAGNOSIS — N179 Acute kidney failure, unspecified: Secondary | ICD-10-CM | POA: Diagnosis not present

## 2013-08-12 DIAGNOSIS — R9389 Abnormal findings on diagnostic imaging of other specified body structures: Secondary | ICD-10-CM | POA: Diagnosis not present

## 2013-08-12 DIAGNOSIS — I2510 Atherosclerotic heart disease of native coronary artery without angina pectoris: Secondary | ICD-10-CM | POA: Diagnosis not present

## 2013-08-12 DIAGNOSIS — E669 Obesity, unspecified: Secondary | ICD-10-CM | POA: Diagnosis not present

## 2013-08-12 DIAGNOSIS — D899 Disorder involving the immune mechanism, unspecified: Secondary | ICD-10-CM | POA: Diagnosis not present

## 2013-08-12 DIAGNOSIS — I6350 Cerebral infarction due to unspecified occlusion or stenosis of unspecified cerebral artery: Secondary | ICD-10-CM | POA: Diagnosis not present

## 2013-08-12 DIAGNOSIS — Z79899 Other long term (current) drug therapy: Secondary | ICD-10-CM | POA: Diagnosis not present

## 2013-08-12 DIAGNOSIS — I129 Hypertensive chronic kidney disease with stage 1 through stage 4 chronic kidney disease, or unspecified chronic kidney disease: Secondary | ICD-10-CM | POA: Diagnosis not present

## 2013-08-12 DIAGNOSIS — R21 Rash and other nonspecific skin eruption: Secondary | ICD-10-CM | POA: Diagnosis not present

## 2013-08-12 DIAGNOSIS — F17200 Nicotine dependence, unspecified, uncomplicated: Secondary | ICD-10-CM | POA: Diagnosis not present

## 2013-08-12 DIAGNOSIS — Z951 Presence of aortocoronary bypass graft: Secondary | ICD-10-CM | POA: Diagnosis not present

## 2013-08-12 DIAGNOSIS — E785 Hyperlipidemia, unspecified: Secondary | ICD-10-CM | POA: Diagnosis not present

## 2013-08-12 DIAGNOSIS — M25429 Effusion, unspecified elbow: Secondary | ICD-10-CM | POA: Diagnosis not present

## 2013-08-12 DIAGNOSIS — Z48298 Encounter for aftercare following other organ transplant: Secondary | ICD-10-CM | POA: Diagnosis not present

## 2013-08-12 DIAGNOSIS — N183 Chronic kidney disease, stage 3 unspecified: Secondary | ICD-10-CM | POA: Diagnosis not present

## 2013-08-12 DIAGNOSIS — E66811 Obesity, class 1: Secondary | ICD-10-CM | POA: Insufficient documentation

## 2013-08-16 ENCOUNTER — Encounter: Payer: Self-pay | Admitting: Cardiovascular Disease

## 2013-08-16 ENCOUNTER — Ambulatory Visit (INDEPENDENT_AMBULATORY_CARE_PROVIDER_SITE_OTHER): Payer: 59 | Admitting: Cardiovascular Disease

## 2013-08-16 VITALS — BP 90/50 | HR 83 | Ht 67.5 in | Wt 204.0 lb

## 2013-08-16 DIAGNOSIS — N189 Chronic kidney disease, unspecified: Secondary | ICD-10-CM

## 2013-08-16 DIAGNOSIS — I4891 Unspecified atrial fibrillation: Secondary | ICD-10-CM

## 2013-08-16 DIAGNOSIS — I10 Essential (primary) hypertension: Secondary | ICD-10-CM

## 2013-08-16 DIAGNOSIS — I480 Paroxysmal atrial fibrillation: Secondary | ICD-10-CM

## 2013-08-16 DIAGNOSIS — I25810 Atherosclerosis of coronary artery bypass graft(s) without angina pectoris: Secondary | ICD-10-CM

## 2013-08-16 NOTE — Assessment & Plan Note (Signed)
S/P renal transplant with some rejection Tapering steroids.  F/U Babtist  Cr stable around 1.6

## 2013-08-16 NOTE — Assessment & Plan Note (Signed)
Stable with no angina and good activity level.  Continue medical Rx  

## 2013-08-16 NOTE — Patient Instructions (Signed)
Your physician wants you to follow-up in:  6 MONTHS WITH DR NISHAN  You will receive a reminder letter in the mail two months in advance. If you don't receive a letter, please call our office to schedule the follow-up appointment. Your physician recommends that you continue on your current medications as directed. Please refer to the Current Medication list given to you today. 

## 2013-08-16 NOTE — Progress Notes (Signed)
Patient ID: [name], male   DOB: [birth year], 52 y.o.   MRN: [id number] 52 yo f/u CAD and Flutter.  Post renal transplant.   Had CABG on 4/13  Subsequent fluter ablation by Dr Graciela Husbands.  EF normal by echo 11/13  60-65%  Nonischemic post CABG myovue 12/13.  03/23/13 had renal transplant from son to right pelvis Baseline Cr 1.4 to 1.6  Still on Prograf and myfortic with tapering prednisone  Had a lot of side effects from steroids with weight gain and irritability.  No cardiac issues.  Mild dependant edema.  No palpitations or chest pain BP low but does not want to lower nightly clonidine   ROS: Denies fever, malais, weight loss, blurry vision, decreased visual acuity, cough, sputum, SOB, hemoptysis, pleuritic pain, palpitaitons, heartburn, abdominal pain, melena, lower extremity edema, claudication, or rash.  All other systems reviewed and negative  General: Affect appropriate Mildly cushingoid  HEENT: normal Neck supple with no adenopathy JVP normal no bruits no thyromegaly Lungs clear with no wheezing and good diaphragmatic motion Heart:  S1/S2 no murmur, no rub, gallop or click PMI normal Abdomen: benighn, BS positve, no tenderness, no AAA  Transplant RLQ no bruit.  No HSM or HJR Distal pulses intact with no bruits  Bruit and fistula in left wrist  No edema Neuro non-focal Skin warm and dry No muscular weakness   Current Outpatient Prescriptions  Medication Sig Dispense Refill  . allopurinol (ZYLOPRIM) 100 MG tablet Take 100 mg by mouth 2 (two) times daily.       Marland Kitchen aspirin 81 MG EC tablet Take 1 tablet (81 mg total) by mouth daily.  30 tablet    . cloNIDine (CATAPRES) 0.2 MG tablet Take 0.2 mg by mouth daily.       . fenofibrate micronized (LOFIBRA) 134 MG capsule Take 134 mg by mouth daily before breakfast.      . furosemide (LASIX) 80 MG tablet Take 160 mg by mouth every morning.       . Magnesium 400 MG CAPS Take by mouth daily.      . metoprolol (LOPRESSOR) 50 MG tablet Take 0.5  tablets (25 mg total) by mouth 3 (three) times daily.      . mycophenolate (MYFORTIC) 180 MG EC tablet Take by mouth 2 (two) times daily. Take 4 tabs in the a.m and 4 tabs in the p.m.      Marland Kitchen nisoldipine (SULAR) 20 MG 24 hr tablet Take 20 mg by mouth 2 (two) times daily.      . nitroGLYCERIN (NITROSTAT) 0.4 MG SL tablet Place 0.4 mg under the tongue every 5 (five) minutes x 3 doses as needed. For chest pain      . omega-3 acid ethyl esters (LOVAZA) 1 G capsule Take 2 g by mouth 2 (two) times daily.      Marland Kitchen omeprazole (PRILOSEC) 10 MG capsule Take 10 mg by mouth daily.      . potassium phosphate, monobasic, (K-PHOS ORIGINAL) 500 MG tablet Take 500 mg by mouth daily.      . prednisoLONE 5 MG TABS tablet Take 5 mg by mouth daily.      . simvastatin (ZOCOR) 40 MG tablet Take 40 mg by mouth daily.      Marland Kitchen sulfamethoxazole-trimethoprim (BACTRIM DS) 800-160 MG per tablet Take 1 tablet by mouth 2 (two) times daily. Take 1 tab 3 times a week mon, wedns, fri      . tacrolimus (PROGRAF) 1 MG capsule Take  1 mg by mouth 2 (two) times daily. Take two caps  in the a.m. and one and a half caps  p.m.      Marland Kitchen valGANciclovir (VALCYTE) 450 MG tablet Take 450 mg by mouth daily. Take 1 tab 3 times a week mon , wedns, fri       No current facility-administered medications for this visit.    Allergies  Penicillins and Contrast media  Electrocardiogram:  Assessment and Plan

## 2013-08-16 NOTE — Assessment & Plan Note (Signed)
S/P ablation maint NSR  Continue ASA

## 2013-08-16 NOTE — Assessment & Plan Note (Signed)
Low BP since fistula and transplant.  Suggested weaning clonidine but patient feels he needs it  No presyncope or symptoms

## 2013-08-26 DIAGNOSIS — Z940 Kidney transplant status: Secondary | ICD-10-CM | POA: Diagnosis not present

## 2013-08-26 DIAGNOSIS — Z48298 Encounter for aftercare following other organ transplant: Secondary | ICD-10-CM | POA: Diagnosis not present

## 2013-09-02 DIAGNOSIS — Z940 Kidney transplant status: Secondary | ICD-10-CM | POA: Diagnosis not present

## 2013-09-02 DIAGNOSIS — Z48298 Encounter for aftercare following other organ transplant: Secondary | ICD-10-CM | POA: Diagnosis not present

## 2013-09-16 ENCOUNTER — Ambulatory Visit (HOSPITAL_COMMUNITY)
Admission: RE | Admit: 2013-09-16 | Discharge: 2013-09-16 | Disposition: A | Discharge status: Home / Self Care | Payer: 59 | Source: Ambulatory Visit | Attending: Internal Medicine | Admitting: Internal Medicine

## 2013-09-16 ENCOUNTER — Other Ambulatory Visit (HOSPITAL_COMMUNITY): Payer: Self-pay | Admitting: Internal Medicine

## 2013-09-16 DIAGNOSIS — R059 Cough, unspecified: Secondary | ICD-10-CM

## 2013-09-16 DIAGNOSIS — Z951 Presence of aortocoronary bypass graft: Secondary | ICD-10-CM | POA: Insufficient documentation

## 2013-09-16 DIAGNOSIS — I2510 Atherosclerotic heart disease of native coronary artery without angina pectoris: Secondary | ICD-10-CM | POA: Insufficient documentation

## 2013-09-16 DIAGNOSIS — F17200 Nicotine dependence, unspecified, uncomplicated: Secondary | ICD-10-CM | POA: Insufficient documentation

## 2013-09-16 DIAGNOSIS — R05 Cough: Secondary | ICD-10-CM

## 2013-09-16 DIAGNOSIS — I10 Essential (primary) hypertension: Secondary | ICD-10-CM | POA: Insufficient documentation

## 2013-11-04 DIAGNOSIS — R82998 Other abnormal findings in urine: Secondary | ICD-10-CM | POA: Diagnosis not present

## 2013-11-04 DIAGNOSIS — E785 Hyperlipidemia, unspecified: Secondary | ICD-10-CM | POA: Diagnosis not present

## 2013-11-04 DIAGNOSIS — I10 Essential (primary) hypertension: Secondary | ICD-10-CM | POA: Diagnosis not present

## 2013-11-04 DIAGNOSIS — R8279 Other abnormal findings on microbiological examination of urine: Secondary | ICD-10-CM | POA: Insufficient documentation

## 2013-11-04 DIAGNOSIS — T8610 Unspecified complication of kidney transplant: Secondary | ICD-10-CM | POA: Diagnosis not present

## 2013-11-04 DIAGNOSIS — Z940 Kidney transplant status: Secondary | ICD-10-CM | POA: Diagnosis not present

## 2013-11-04 DIAGNOSIS — D899 Disorder involving the immune mechanism, unspecified: Secondary | ICD-10-CM | POA: Diagnosis not present

## 2013-11-18 DIAGNOSIS — Z48298 Encounter for aftercare following other organ transplant: Secondary | ICD-10-CM | POA: Diagnosis not present

## 2013-11-18 DIAGNOSIS — Z940 Kidney transplant status: Secondary | ICD-10-CM | POA: Diagnosis not present

## 2013-12-07 DIAGNOSIS — Z48298 Encounter for aftercare following other organ transplant: Secondary | ICD-10-CM | POA: Diagnosis not present

## 2013-12-07 DIAGNOSIS — N183 Chronic kidney disease, stage 3 unspecified: Secondary | ICD-10-CM | POA: Diagnosis not present

## 2013-12-07 DIAGNOSIS — Z940 Kidney transplant status: Secondary | ICD-10-CM | POA: Diagnosis not present

## 2013-12-07 DIAGNOSIS — I10 Essential (primary) hypertension: Secondary | ICD-10-CM | POA: Diagnosis not present

## 2013-12-07 DIAGNOSIS — D899 Disorder involving the immune mechanism, unspecified: Secondary | ICD-10-CM | POA: Diagnosis not present

## 2014-02-14 ENCOUNTER — Other Ambulatory Visit (HOSPITAL_COMMUNITY): Payer: Self-pay | Admitting: *Deleted

## 2014-02-15 ENCOUNTER — Encounter (HOSPITAL_COMMUNITY)
Admission: RE | Admit: 2014-02-15 | Discharge: 2014-02-15 | Disposition: A | Discharge status: Home / Self Care | Payer: 59 | Source: Ambulatory Visit | Attending: Nephrology | Admitting: Nephrology

## 2014-02-15 DIAGNOSIS — D751 Secondary polycythemia: Secondary | ICD-10-CM | POA: Diagnosis present

## 2014-02-15 DIAGNOSIS — Z940 Kidney transplant status: Secondary | ICD-10-CM | POA: Diagnosis not present

## 2014-02-15 DIAGNOSIS — M109 Gout, unspecified: Secondary | ICD-10-CM | POA: Insufficient documentation

## 2014-02-15 DIAGNOSIS — E785 Hyperlipidemia, unspecified: Secondary | ICD-10-CM | POA: Insufficient documentation

## 2014-02-15 DIAGNOSIS — I120 Hypertensive chronic kidney disease with stage 5 chronic kidney disease or end stage renal disease: Secondary | ICD-10-CM | POA: Diagnosis not present

## 2014-02-15 DIAGNOSIS — N2581 Secondary hyperparathyroidism of renal origin: Secondary | ICD-10-CM | POA: Diagnosis not present

## 2014-02-15 DIAGNOSIS — R7309 Other abnormal glucose: Secondary | ICD-10-CM | POA: Diagnosis not present

## 2014-02-15 DIAGNOSIS — I2510 Atherosclerotic heart disease of native coronary artery without angina pectoris: Secondary | ICD-10-CM | POA: Insufficient documentation

## 2014-02-15 DIAGNOSIS — N185 Chronic kidney disease, stage 5: Secondary | ICD-10-CM | POA: Diagnosis not present

## 2014-02-15 NOTE — Progress Notes (Signed)
Used right AC and removed 500cc of blood per protocal and md order.  Pt tolerated procedure well

## 2014-02-16 ENCOUNTER — Encounter: Payer: Self-pay | Admitting: Cardiovascular Disease

## 2014-02-16 ENCOUNTER — Ambulatory Visit (INDEPENDENT_AMBULATORY_CARE_PROVIDER_SITE_OTHER): Payer: 59 | Admitting: Cardiovascular Disease

## 2014-02-16 VITALS — BP 130/75 | HR 75 | Ht 67.5 in | Wt 217.0 lb

## 2014-02-16 DIAGNOSIS — R011 Cardiac murmur, unspecified: Secondary | ICD-10-CM | POA: Insufficient documentation

## 2014-02-16 DIAGNOSIS — I25810 Atherosclerosis of coronary artery bypass graft(s) without angina pectoris: Secondary | ICD-10-CM

## 2014-02-16 DIAGNOSIS — I4891 Unspecified atrial fibrillation: Secondary | ICD-10-CM

## 2014-02-16 DIAGNOSIS — I480 Paroxysmal atrial fibrillation: Secondary | ICD-10-CM

## 2014-02-16 DIAGNOSIS — N189 Chronic kidney disease, unspecified: Secondary | ICD-10-CM

## 2014-02-16 DIAGNOSIS — I4892 Unspecified atrial flutter: Secondary | ICD-10-CM

## 2014-02-16 NOTE — Assessment & Plan Note (Signed)
Post ablation NSR frequent PAC;s continue beta blocker

## 2014-02-16 NOTE — Progress Notes (Addendum)
Patient ID: [name], male   DOB: 1961-01-10, 53 y.o.   MRN: [id number] 53 yo f/u CAD and Flutter. Post renal transplant. Had CABG on 4/13 Subsequent fluter ablation by Dr Caryl Comes. EF normal by echo 11/13 60-65% Nonischemic post CABG myovue  12/13. 03/23/13 had renal transplant from son to right pelvis Baseline Cr 1.4 to 1.6 Still on Prograf and myfortic with tapering prednisone Had a lot of side effects from steroids with weight gain and irritability. No cardiac issues. Mild dependant edema. No palpitations or chest pain BP low but does not want to lower nightly clonidine   Had frequent PAC;s in office that were asymptomatic     ROS: Denies fever, malais, weight loss, blurry vision, decreased visual acuity, cough, sputum, SOB, hemoptysis, pleuritic pain, palpitaitons, heartburn, abdominal pain, melena, lower extremity edema, claudication, or rash.  All other systems reviewed and negative  General: Affect appropriate Cushingoid with weight gain  HEENT: normal Neck supple with no adenopathy JVP normal no bruits no thyromegaly Lungs clear with no wheezing and good diaphragmatic motion Heart:  S1/S2 no murmur, no rub, gallop or click PMI normal Abdomen: benighn, BS positve, no tenderness, no AAA  Transplanted kidney LQ  no bruit.  No HSM or HJR Distal pulses intact with no bruits No edema Neuro non-focal Skin warm and dry No muscular weakness   Current Outpatient Prescriptions  Medication Sig Dispense Refill  . allopurinol (ZYLOPRIM) 100 MG tablet Take 100 mg by mouth 2 (two) times daily.       Marland Kitchen aspirin 81 MG EC tablet Take 1 tablet (81 mg total) by mouth daily.  30 tablet    . cloNIDine (CATAPRES) 0.2 MG tablet Take 0.2 mg by mouth daily.       . fenofibrate micronized (LOFIBRA) 134 MG capsule Take 134 mg by mouth daily before breakfast.      . furosemide (LASIX) 80 MG tablet Take 160 mg by mouth every morning.       . Magnesium 400 MG CAPS Take by mouth daily.      . metoprolol  (LOPRESSOR) 50 MG tablet Take 0.5 tablets (25 mg total) by mouth 3 (three) times daily.      . mycophenolate (MYFORTIC) 180 MG EC tablet Take by mouth 2 (two) times daily. Take 4 tabs in the a.m and 4 tabs in the p.m.      Marland Kitchen nisoldipine (SULAR) 20 MG 24 hr tablet Take 20 mg by mouth 2 (two) times daily.      . nitroGLYCERIN (NITROSTAT) 0.4 MG SL tablet Place 0.4 mg under the tongue every 5 (five) minutes x 3 doses as needed. For chest pain      . omega-3 acid ethyl esters (LOVAZA) 1 G capsule Take 2 g by mouth 2 (two) times daily.      Marland Kitchen omeprazole (PRILOSEC) 10 MG capsule Take 10 mg by mouth daily.      . potassium phosphate, monobasic, (K-PHOS ORIGINAL) 500 MG tablet Take 500 mg by mouth daily.      . prednisoLONE 5 MG TABS tablet Take 5 mg by mouth daily.      . simvastatin (ZOCOR) 40 MG tablet Take 40 mg by mouth daily.      Marland Kitchen sulfamethoxazole-trimethoprim (BACTRIM DS) 800-160 MG per tablet Take 1 tablet by mouth 2 (two) times daily. Take 1 tab 3 times a week mon, wedns, fri      . tacrolimus (PROGRAF) 1 MG capsule Take 1 mg by mouth  2 (two) times daily. Take two caps  in the a.m. and one and a half caps  p.m.      Marland Kitchen valGANciclovir (VALCYTE) 450 MG tablet Take 450 mg by mouth daily. Take 1 tab 3 times a week mon , wedns, fri       No current facility-administered medications for this visit.    Allergies  Penicillins and Contrast media  Electrocardiogram:  SR rate 76  PAC;s LAD LVH   Assessment and Plan

## 2014-02-16 NOTE — Patient Instructions (Addendum)
Your physician wants you to follow-up in:   6 MONTHS WITH DR NISHAN  ECHO SAME DAY   You will receive a reminder letter in the mail two months in advance. If you don't receive a letter, please call our office to schedule the follow-up appointment. Your physician recommends that you continue on your current medications as directed. Please refer to the Current Medication list given to you today.  Your physician has requested that you have an echocardiogram. Echocardiography is a painless test that uses sound waves to create images of your heart. It provides your doctor with information about the size and shape of your heart and how well your heart's chambers and valves are working. This procedure takes approximately one hour. There are no restrictions for this procedure.  IN   6 MONTHS   

## 2014-02-16 NOTE — Assessment & Plan Note (Signed)
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

## 2014-02-16 NOTE — Assessment & Plan Note (Signed)
F/U [name]  CR stable Hopefully can come off prednisone soon.

## 2014-02-16 NOTE — Assessment & Plan Note (Signed)
Stable with no angina and good activity level.  Continue medical Rx  

## 2014-03-03 ENCOUNTER — Other Ambulatory Visit (HOSPITAL_COMMUNITY): Payer: 59

## 2014-03-22 DIAGNOSIS — Z79899 Other long term (current) drug therapy: Secondary | ICD-10-CM | POA: Insufficient documentation

## 2014-03-22 DIAGNOSIS — Z951 Presence of aortocoronary bypass graft: Secondary | ICD-10-CM | POA: Diagnosis not present

## 2014-03-22 DIAGNOSIS — I2510 Atherosclerotic heart disease of native coronary artery without angina pectoris: Secondary | ICD-10-CM | POA: Diagnosis not present

## 2014-03-22 DIAGNOSIS — I10 Essential (primary) hypertension: Secondary | ICD-10-CM | POA: Diagnosis not present

## 2014-03-22 DIAGNOSIS — E669 Obesity, unspecified: Secondary | ICD-10-CM | POA: Diagnosis not present

## 2014-03-22 DIAGNOSIS — R799 Abnormal finding of blood chemistry, unspecified: Secondary | ICD-10-CM | POA: Diagnosis not present

## 2014-03-22 DIAGNOSIS — N183 Chronic kidney disease, stage 3 unspecified: Secondary | ICD-10-CM | POA: Diagnosis not present

## 2014-03-22 DIAGNOSIS — Z940 Kidney transplant status: Secondary | ICD-10-CM | POA: Insufficient documentation

## 2014-03-22 DIAGNOSIS — Z48298 Encounter for aftercare following other organ transplant: Secondary | ICD-10-CM | POA: Diagnosis not present

## 2014-04-11 ENCOUNTER — Other Ambulatory Visit (HOSPITAL_COMMUNITY): Payer: Self-pay | Admitting: *Deleted

## 2014-04-12 ENCOUNTER — Encounter (HOSPITAL_COMMUNITY)
Admission: RE | Admit: 2014-04-12 | Discharge: 2014-04-12 | Disposition: A | Discharge status: Home / Self Care | Payer: 59 | Source: Ambulatory Visit | Attending: Nephrology | Admitting: Nephrology

## 2014-04-12 DIAGNOSIS — I2510 Atherosclerotic heart disease of native coronary artery without angina pectoris: Secondary | ICD-10-CM | POA: Insufficient documentation

## 2014-04-12 DIAGNOSIS — M109 Gout, unspecified: Secondary | ICD-10-CM | POA: Insufficient documentation

## 2014-04-12 DIAGNOSIS — R7309 Other abnormal glucose: Secondary | ICD-10-CM | POA: Insufficient documentation

## 2014-04-12 DIAGNOSIS — N2581 Secondary hyperparathyroidism of renal origin: Secondary | ICD-10-CM | POA: Insufficient documentation

## 2014-04-12 DIAGNOSIS — D751 Secondary polycythemia: Secondary | ICD-10-CM | POA: Insufficient documentation

## 2014-04-12 DIAGNOSIS — N185 Chronic kidney disease, stage 5: Secondary | ICD-10-CM | POA: Insufficient documentation

## 2014-04-12 DIAGNOSIS — I120 Hypertensive chronic kidney disease with stage 5 chronic kidney disease or end stage renal disease: Secondary | ICD-10-CM | POA: Insufficient documentation

## 2014-04-12 DIAGNOSIS — E785 Hyperlipidemia, unspecified: Secondary | ICD-10-CM | POA: Insufficient documentation

## 2014-04-12 DIAGNOSIS — Z940 Kidney transplant status: Secondary | ICD-10-CM | POA: Insufficient documentation

## 2014-04-12 MED ORDER — LIDOCAINE HCL 2 % IJ SOLN: 0.1000 mL | Freq: Once | INTRAMUSCULAR | Status: DC

## 2014-04-12 MED ORDER — LIDOCAINE HCL (PF) 2 % IJ SOLN
INTRAMUSCULAR | Status: AC
  Filled 2014-04-12: qty 10

## 2014-04-12 NOTE — Progress Notes (Signed)
Phlebotomy performed per MD order and per protocol.  500 cc of blood was removed.  PT tolerated procedure well.

## 2014-05-03 DIAGNOSIS — N2581 Secondary hyperparathyroidism of renal origin: Secondary | ICD-10-CM | POA: Diagnosis not present

## 2014-05-03 DIAGNOSIS — Z940 Kidney transplant status: Secondary | ICD-10-CM | POA: Diagnosis not present

## 2014-07-27 DIAGNOSIS — R7309 Other abnormal glucose: Secondary | ICD-10-CM | POA: Diagnosis not present

## 2014-07-27 DIAGNOSIS — N2581 Secondary hyperparathyroidism of renal origin: Secondary | ICD-10-CM | POA: Diagnosis not present

## 2014-07-27 DIAGNOSIS — Z940 Kidney transplant status: Secondary | ICD-10-CM | POA: Diagnosis not present

## 2014-08-26 ENCOUNTER — Ambulatory Visit (HOSPITAL_COMMUNITY): Payer: 59 | Attending: Cardiology | Admitting: Radiology

## 2014-08-26 ENCOUNTER — Other Ambulatory Visit: Payer: Self-pay

## 2014-08-26 ENCOUNTER — Ambulatory Visit (INDEPENDENT_AMBULATORY_CARE_PROVIDER_SITE_OTHER): Payer: 59 | Admitting: Cardiovascular Disease

## 2014-08-26 ENCOUNTER — Encounter: Payer: Self-pay | Admitting: Cardiovascular Disease

## 2014-08-26 VITALS — BP 118/86 | HR 58 | Ht 68.0 in | Wt 221.8 lb

## 2014-08-26 DIAGNOSIS — Z87891 Personal history of nicotine dependence: Secondary | ICD-10-CM | POA: Insufficient documentation

## 2014-08-26 DIAGNOSIS — I4892 Unspecified atrial flutter: Secondary | ICD-10-CM | POA: Insufficient documentation

## 2014-08-26 DIAGNOSIS — I25810 Atherosclerosis of coronary artery bypass graft(s) without angina pectoris: Secondary | ICD-10-CM

## 2014-08-26 DIAGNOSIS — I4891 Unspecified atrial fibrillation: Secondary | ICD-10-CM

## 2014-08-26 DIAGNOSIS — I10 Essential (primary) hypertension: Secondary | ICD-10-CM | POA: Insufficient documentation

## 2014-08-26 DIAGNOSIS — I2510 Atherosclerotic heart disease of native coronary artery without angina pectoris: Secondary | ICD-10-CM | POA: Diagnosis not present

## 2014-08-26 DIAGNOSIS — R011 Cardiac murmur, unspecified: Secondary | ICD-10-CM | POA: Insufficient documentation

## 2014-08-26 DIAGNOSIS — I150 Renovascular hypertension: Secondary | ICD-10-CM

## 2014-08-26 DIAGNOSIS — I252 Old myocardial infarction: Secondary | ICD-10-CM | POA: Diagnosis not present

## 2014-08-26 DIAGNOSIS — I480 Paroxysmal atrial fibrillation: Secondary | ICD-10-CM

## 2014-08-26 NOTE — Assessment & Plan Note (Signed)
Well controlled.  Continue current medications and low sodium Dash type diet.    

## 2014-08-26 NOTE — Progress Notes (Signed)
Echocardiogram performed.  

## 2014-08-26 NOTE — Assessment & Plan Note (Signed)
Mild MR and trivial AR on echo reviewed today

## 2014-08-26 NOTE — Assessment & Plan Note (Signed)
Continue asa and beta blocker  Considerable risk of recurrence with ambient PAC;s , severe LVH and moderate to severe LAE

## 2014-08-26 NOTE — Progress Notes (Signed)
Patient ID: [name], male   DOB: 05-Mar-1961, 53 y.o.   MRN: [id number] 53 yo f/u CAD and Flutter. Post renal transplant. Had CABG on 4/13 Subsequent fluter ablation by Dr Caryl Comes. EF normal by echo 11/13 60-65% Nonischemic post CABG myovue  12/13. 03/23/13 had renal transplant from son to right pelvis Baseline Cr 1.4 to 1.6 Still on Prograf and myfortic with tapering prednisone Had a lot of side effects from steroids with weight gain and irritability. No cardiac issues. Mild dependant edema. No palpitations or chest pain BP low but does not want to lower nightly clonidine  Had frequent PAC;s in office that were asymptomatic   Echo today still with severe LVH and LAE.  Mild MR EF normal.  Discussed risk of recurrent afib.  He has occasional bad days of irregular beats ? 2x/58months  Encouraged him to get ECG at hospital or our office if recurs     ROS: Denies fever, malais, weight loss, blurry vision, decreased visual acuity, cough, sputum, SOB, hemoptysis, pleuritic pain, palpitaitons, heartburn, abdominal pain, melena, lower extremity edema, claudication, or rash.  All other systems reviewed and negative  General: Affect appropriate Overweight white male  HEENT: normal Neck supple with no adenopathy JVP normal no bruits no thyromegaly Lungs clear with no wheezing and good diaphragmatic motion Heart:  S1/S2 SEM  murmur, no rub, gallop or click PMI normal Abdomen: benighn, BS positve, no tenderness, no AAA transplant in right pelvic fossa no bruit.  No HSM or HJR Distal pulses intact with no bruits No edema Neuro non-focal Skin warm and dry No muscular weakness   Current Outpatient Prescriptions  Medication Sig Dispense Refill  . allopurinol (ZYLOPRIM) 100 MG tablet Take 100 mg by mouth 2 (two) times daily.       Marland Kitchen aspirin 81 MG EC tablet Take 1 tablet (81 mg total) by mouth daily.  30 tablet    . cloNIDine (CATAPRES) 0.2 MG tablet Take 0.2 mg by mouth daily.       . fenofibrate  micronized (LOFIBRA) 134 MG capsule Take 134 mg by mouth daily before breakfast.      . furosemide (LASIX) 80 MG tablet Take 160 mg by mouth every morning.       . Magnesium 400 MG CAPS Take by mouth daily.      . metoprolol (LOPRESSOR) 50 MG tablet Take 0.5 tablets (25 mg total) by mouth 3 (three) times daily.      . mycophenolate (MYFORTIC) 180 MG EC tablet Take by mouth 2 (two) times daily. Take 4 tabs in the a.m and 4 tabs in the p.m.      Marland Kitchen nisoldipine (SULAR) 20 MG 24 hr tablet Take 20 mg by mouth 2 (two) times daily.      . nitroGLYCERIN (NITROSTAT) 0.4 MG SL tablet Place 0.4 mg under the tongue every 5 (five) minutes x 3 doses as needed. For chest pain      . omega-3 acid ethyl esters (LOVAZA) 1 G capsule Take 2 g by mouth 2 (two) times daily.      Marland Kitchen omeprazole (PRILOSEC) 10 MG capsule Take 10 mg by mouth daily.      . prednisoLONE 5 MG TABS tablet Take 5 mg by mouth daily.      . simvastatin (ZOCOR) 40 MG tablet Take 40 mg by mouth daily.      Marland Kitchen sulfamethoxazole-trimethoprim (BACTRIM DS) 800-160 MG per tablet Take 1 tablet by mouth 2 (two) times daily. Take 1  tab 3 times a week mon, wedns, fri      . tacrolimus (PROGRAF) 1 MG capsule Take by mouth 2 (two) times daily. Taking 1.5 MG in AM, Taking 1 mg in PM       No current facility-administered medications for this visit.    Allergies  Penicillins and Contrast media  Electrocardiogram:  SR PAC;s LAD LVH  Assessment and Plan

## 2014-08-26 NOTE — Assessment & Plan Note (Signed)
Stable with no angina and good activity level.  Continue medical Rx  

## 2014-08-26 NOTE — Patient Instructions (Signed)
Your physician wants you to follow-up in:  6 MONTHS WITH DR NISHAN  You will receive a reminder letter in the mail two months in advance. If you don't receive a letter, please call our office to schedule the follow-up appointment. Your physician recommends that you continue on your current medications as directed. Please refer to the Current Medication list given to you today. 

## 2014-09-29 DIAGNOSIS — E785 Hyperlipidemia, unspecified: Secondary | ICD-10-CM | POA: Diagnosis not present

## 2014-09-29 DIAGNOSIS — Z940 Kidney transplant status: Secondary | ICD-10-CM | POA: Diagnosis not present

## 2014-09-29 DIAGNOSIS — R739 Hyperglycemia, unspecified: Secondary | ICD-10-CM | POA: Diagnosis not present

## 2014-09-29 DIAGNOSIS — N2581 Secondary hyperparathyroidism of renal origin: Secondary | ICD-10-CM | POA: Diagnosis not present

## 2014-11-10 ENCOUNTER — Ambulatory Visit (HOSPITAL_COMMUNITY)
Admission: RE | Admit: 2014-11-10 | Discharge: 2014-11-10 | Disposition: A | Discharge status: Home / Self Care | Payer: 59 | Source: Ambulatory Visit | Attending: Nephrology | Admitting: Nephrology

## 2014-11-10 ENCOUNTER — Encounter (HOSPITAL_COMMUNITY): Payer: Self-pay | Admitting: Cardiovascular Disease

## 2014-11-10 ENCOUNTER — Other Ambulatory Visit (HOSPITAL_COMMUNITY): Payer: Self-pay | Admitting: Nephrology

## 2014-11-10 DIAGNOSIS — M25532 Pain in left wrist: Secondary | ICD-10-CM | POA: Diagnosis not present

## 2014-11-10 DIAGNOSIS — M25432 Effusion, left wrist: Secondary | ICD-10-CM

## 2014-11-14 DIAGNOSIS — Z940 Kidney transplant status: Secondary | ICD-10-CM | POA: Diagnosis not present

## 2014-11-18 ENCOUNTER — Other Ambulatory Visit (HOSPITAL_COMMUNITY): Payer: Self-pay | Admitting: *Deleted

## 2014-11-21 ENCOUNTER — Encounter (HOSPITAL_COMMUNITY)
Admission: RE | Admit: 2014-11-21 | Discharge: 2014-11-21 | Disposition: A | Discharge status: Home / Self Care | Payer: 59 | Source: Ambulatory Visit | Attending: Nephrology | Admitting: Nephrology

## 2014-11-21 DIAGNOSIS — D751 Secondary polycythemia: Secondary | ICD-10-CM | POA: Diagnosis not present

## 2014-11-21 DIAGNOSIS — R319 Hematuria, unspecified: Secondary | ICD-10-CM | POA: Insufficient documentation

## 2014-11-21 DIAGNOSIS — Z940 Kidney transplant status: Secondary | ICD-10-CM | POA: Insufficient documentation

## 2014-11-21 LAB — URINALYSIS W MICROSCOPIC (NOT AT ARMC)
Bilirubin Urine: NEGATIVE
Glucose, UA: NEGATIVE mg/dL
KETONES UR: NEGATIVE mg/dL
LEUKOCYTES UA: NEGATIVE
Nitrite: NEGATIVE
PROTEIN: NEGATIVE mg/dL
Specific Gravity, Urine: 1.016 (ref 1.005–1.030)
UROBILINOGEN UA: 1 mg/dL (ref 0.0–1.0)
pH: 7 (ref 5.0–8.0)

## 2014-11-21 MED ORDER — LIDOCAINE HCL (PF) 1 % IJ SOLN
INTRAMUSCULAR | Status: AC
  Filled 2014-11-21: qty 2

## 2014-11-21 NOTE — Progress Notes (Addendum)
500 cc phlebotomy completed using right antecub. Pt. Tolerated well; pt anxious about using right AC therefore 20g started and phlebotomy completed by drawing off blood using syringes

## 2014-11-22 LAB — POCT HEMOGLOBIN-HEMACUE: Hemoglobin: 17 g/dL (ref 13.0–17.0)

## 2014-12-10 DIAGNOSIS — M25532 Pain in left wrist: Secondary | ICD-10-CM | POA: Diagnosis not present

## 2014-12-15 ENCOUNTER — Encounter (HOSPITAL_COMMUNITY): Payer: Self-pay | Admitting: Cardiovascular Disease

## 2015-01-02 DIAGNOSIS — R312 Other microscopic hematuria: Secondary | ICD-10-CM | POA: Diagnosis not present

## 2015-01-02 DIAGNOSIS — I129 Hypertensive chronic kidney disease with stage 1 through stage 4 chronic kidney disease, or unspecified chronic kidney disease: Secondary | ICD-10-CM | POA: Diagnosis not present

## 2015-01-02 DIAGNOSIS — Z940 Kidney transplant status: Secondary | ICD-10-CM | POA: Diagnosis not present

## 2015-01-02 DIAGNOSIS — D751 Secondary polycythemia: Secondary | ICD-10-CM | POA: Diagnosis not present

## 2015-02-07 DIAGNOSIS — Z940 Kidney transplant status: Secondary | ICD-10-CM | POA: Diagnosis not present

## 2015-02-21 NOTE — Progress Notes (Signed)
Patient ID: [name], male   DOB: [birth year], 54 y.o.   MRN: [id number] 54 y.o. f/u CAD and Flutter. Post renal transplant. Had CABG on 4/13 Subsequent fluter ablation by Dr Caryl Comes. EF normal by echo 11/13 60-65% Nonischemic post CABG myovue  12/13. 03/23/13 had renal transplant from son to right pelvis Baseline Cr 1.4 to 1.6 Still on Prograf and myfortic with tapering prednisone Had a lot of side effects from steroids with weight gain and irritability. No cardiac issues. Mild dependant edema. No palpitations or chest pain BP low but does not want to lower nightly clonidine  Had frequent PAC;s in office that were asymptomatic   Echo 08/26/2104  still with severe LVH and LAE.  Mild MR EF normal.  Discussed risk of recurrent afib.  He has occasional bad days of irregular beats ? 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  Last long episode of palpitations was at son's wedding In January. Also has ambient PVCls so pulse is usually irregular.     ROS: Denies fever, malais, weight loss, blurry vision, decreased visual acuity, cough, sputum, SOB, hemoptysis, pleuritic pain, palpitaitons, heartburn, abdominal pain, melena, lower extremity edema, claudication, or rash.  All other systems reviewed and negative  General: Affect appropriate Cushingoid  Overweight white male  HEENT: normal Neck supple with no adenopathy JVP normal no bruits no thyromegaly Lungs clear with no wheezing and good diaphragmatic motion Heart:  S1/S2 SEM  murmur, no rub, gallop or click PMI normal Abdomen: benighn, BS positve, no tenderness, no AAA transplant in right pelvic fossa no bruit.  No HSM or HJR Distal pulses intact with no bruits Plus one bilateral edema Neuro non-focal Skin warm and dry No muscular weakness   Current Outpatient Prescriptions  Medication Sig  Dispense Refill  . allopurinol (ZYLOPRIM) 100 MG tablet Take 100 mg by mouth 2 (two) times daily.     Marland Kitchen aspirin 81 MG EC tablet Take 1 tablet (81 mg total) by mouth daily. 30 tablet   . cloNIDine (CATAPRES) 0.2 MG tablet Take 0.2 mg by mouth daily.     . fenofibrate micronized (LOFIBRA) 134 MG capsule Take 134 mg by mouth daily before breakfast.    . furosemide (LASIX) 80 MG tablet Take 160 mg by mouth every morning.     . Magnesium 400 MG CAPS Take by mouth daily.    . metoprolol (LOPRESSOR) 50 MG tablet Take 0.5 tablets (25 mg total) by mouth 3 (three) times daily.    . mycophenolate (MYFORTIC) 180 MG EC tablet Take by mouth 2 (two) times daily. Take 4 tabs in the a.m and 4 tabs in the p.m.    Marland Kitchen nisoldipine (SULAR) 20 MG 24 hr tablet Take 20 mg by mouth 2 (two) times daily.    . nitroGLYCERIN (NITROSTAT) 0.4 MG SL tablet Place 0.4 mg under the tongue every 5 (five) minutes x 3 doses as needed. For chest pain    . omega-3 acid ethyl esters (LOVAZA) 1 G capsule Take 2 g by mouth 2 (two) times daily.    Marland Kitchen omeprazole (PRILOSEC) 10 MG capsule Take 10 mg by mouth daily.    . prednisoLONE 5 MG TABS tablet Take 5 mg by mouth daily.    . simvastatin (ZOCOR) 40 MG tablet Take 40 mg by mouth  daily.    . sulfamethoxazole-trimethoprim (BACTRIM DS) 800-160 MG per tablet Take 1 tablet by mouth 2 (two) times daily. Take 1 tab 3 times a week mon, wedns, fri    . tacrolimus (PROGRAF) 1 MG capsule Take 1 mg by mouth 2 (two) times daily.      No current facility-administered medications for this visit.    Allergies  Penicillins and Contrast media  Electrocardiogram:  02/16/14  SR PAC;s LAD LVH  02/22/15  SR rate 67  LAD LVH  PAC;s    Assessment and Plan

## 2015-02-22 ENCOUNTER — Encounter: Payer: Self-pay | Admitting: Cardiovascular Disease

## 2015-02-22 ENCOUNTER — Ambulatory Visit (INDEPENDENT_AMBULATORY_CARE_PROVIDER_SITE_OTHER): Payer: 59 | Admitting: Cardiovascular Disease

## 2015-02-22 VITALS — BP 124/71 | HR 67 | Ht 68.0 in | Wt 223.0 lb

## 2015-02-22 DIAGNOSIS — N189 Chronic kidney disease, unspecified: Secondary | ICD-10-CM | POA: Diagnosis not present

## 2015-02-22 DIAGNOSIS — I25700 Atherosclerosis of coronary artery bypass graft(s), unspecified, with unstable angina pectoris: Secondary | ICD-10-CM | POA: Diagnosis not present

## 2015-02-22 DIAGNOSIS — I480 Paroxysmal atrial fibrillation: Secondary | ICD-10-CM

## 2015-02-22 DIAGNOSIS — I150 Renovascular hypertension: Secondary | ICD-10-CM

## 2015-02-22 NOTE — Assessment & Plan Note (Signed)
Well controlled.  Continue current medications and low sodium Dash type diet.    

## 2015-02-22 NOTE — Assessment & Plan Note (Signed)
Post transplant.  F/U [place] [facility] [place].  ON 3 drug Rx  Complications from prednisone and marked weight gain

## 2015-02-22 NOTE — Patient Instructions (Addendum)
Your physician wants you to follow-up in:    [place] PAST   You will receive a reminder letter in the mail two months in advance. If you don't receive a letter, please call our office to schedule the follow-up appointment. Your physician recommends that you continue on your current medications as directed. Please refer to the Current Medication list given to you today.

## 2015-02-22 NOTE — Assessment & Plan Note (Signed)
Previous flutter ablation and severe LAE with LVH  Ambient PACls/PVCls and likely infrequent episodes of PAF last in January Continue beta blocker  Will refer back to Dr Caryl Comes to see if any other consideration need to be given to rhythm.  Would like to avoid Anticoagulation given transplant

## 2015-02-22 NOTE — Assessment & Plan Note (Signed)
Stable with no angina and good activity level.  Continue medical Rx  

## 2015-03-03 DIAGNOSIS — E785 Hyperlipidemia, unspecified: Secondary | ICD-10-CM | POA: Diagnosis not present

## 2015-03-03 DIAGNOSIS — Z940 Kidney transplant status: Secondary | ICD-10-CM | POA: Diagnosis not present

## 2015-03-03 DIAGNOSIS — R739 Hyperglycemia, unspecified: Secondary | ICD-10-CM | POA: Diagnosis not present

## 2015-03-03 DIAGNOSIS — N2581 Secondary hyperparathyroidism of renal origin: Secondary | ICD-10-CM | POA: Diagnosis not present

## 2015-03-23 ENCOUNTER — Ambulatory Visit (INDEPENDENT_AMBULATORY_CARE_PROVIDER_SITE_OTHER): Payer: 59 | Admitting: Internal Medicine

## 2015-03-23 ENCOUNTER — Encounter: Payer: Self-pay | Admitting: Internal Medicine

## 2015-03-23 VITALS — BP 130/76 | HR 71 | Ht 68.0 in | Wt 225.2 lb

## 2015-03-23 DIAGNOSIS — I480 Paroxysmal atrial fibrillation: Secondary | ICD-10-CM

## 2015-03-23 DIAGNOSIS — I25700 Atherosclerosis of coronary artery bypass graft(s), unspecified, with unstable angina pectoris: Secondary | ICD-10-CM

## 2015-03-23 MED ORDER — LABETALOL HCL 200 MG PO TABS: 200.0000 mg | ORAL_TABLET | Freq: Two times a day (BID) | ORAL | Status: DC

## 2015-03-23 NOTE — Patient Instructions (Signed)
Medication Instructions:  Your physician has recommended you make the following change in your medication:  1) STOP Amlodipine 2) STOP Metoprolol 3) START Labetalol 200 mg twice daily   Labwork: None  Testing/Procedures: Your physician has recommended that you have a home sleep study. This test records several body functions during sleep, including: brain activity, eye movement, oxygen and carbon dioxide blood levels, heart rate and rhythm, breathing rate and rhythm, the flow of air through your mouth and nose, snoring, body muscle movements, and chest and belly movement.  Steven Obey, RN will place the order for this next week and company ([employer]) will contact you to arrange.  Dr. Caryl Comes recommends using the AliveCor Monitor.    Follow-Up: No follow up is needed at this time with Dr. Caryl Comes.  He will see you on an as needed basis.   Any Other Special Instructions Will Be Listed Below (If Applicable).

## 2015-03-23 NOTE — Progress Notes (Signed)
ELECTROPHYSIOLOGY CONSULT NOTE  Patient ID: [name], MRN: [id number], DOB/AGE: [birth year] 54 y.o. Admit date: (Not on file) Date of Consult: 03/23/2015  Primary Physician: Gennette Pac, MD Primary Cardiologist: [id number] Chief Complaint: ATrial fib and PVC   HPI [name] is a 54 y.o. male  Seen at the request of Dr. Johnsie Cancel because of concern about the likelihood of atrial fibrillation developing the context of significant left atrial enlargement prior atrial flutter and left ventricular hypertrophy. He has had recurrent infrequent episodes of prolonged irregular tachycardia palpitations. These have been associated with fatigue and dyspnea and residual weakness. There reminiscent of his rhythm disturbances with atrial fibrillation.  ECG 3/16 demonstrated multiple atrial ectopic beats   He has a history of atrial flutter and posttermination pauses. He underwent catheter ablation in 2013.  He had a history of ischemic heart disease with prior bypass surgery 4/13 and had post CABG atrial fibrillation.  He has a history of remote stroke.  He also has kidney disease and underwent renal transplantation. He has trouble apparently with many episodes of rejection and is on steroids. He is cushingoid. His weight is up 40 pounds since transplant.  He also has a history of sleep disordered breathing and many years ago had a negative sleep study. He has however in the interval around 40 pounds as noted above. He has daytime somnolence   echocardiogram 9/15 demonstrated severe LVH and left atrial enlargement. (53/2.4) EF was normal.     Past Medical History  Diagnosis Date  . Hypertension   . CAD (coronary artery disease)     a. s/p CABG 03/2012: LIMA to LAD, free RIMA to OM1, SVG to D1, sequential SVG to AM and LPLB2, EVH via right thigh and leg.  . Elevated cholesterol   . Ventricular hypertrophy   . Gout   . Peripheral vascular disease   . Migraines   . Stroke 1995; 2001   denies residual  . Cellulitis 04/13/2012    RLE saphenous vein harvest incision   . Hyperparathyroidism   . Superficial vein thrombosis     a. R greater saphenous 04/2012  . PAF and Flutter     a. Afib post-op CABG; AFlutter 10/2012  . CKD (chronic kidney disease) stage 5, GFR less than 15 ml/min     a. Diagnosed age 30, awaiting transplant  . Sinus node dysfunction-post termination pause     a. >8sec in setting of aflutter 10/2012      Surgical History:  Past Surgical History  Procedure Laterality Date  . Cardiac catheterization  03/16/12  . Renal artery stent  2001  . Fracture surgery    . Av fistula placement  2011    left forearm  . Elbow fracture surgery  1972    left  . Hernia repair  28/3662    umbilical  . Inguinal hernia repair  09/2009    bilaterally  . Dental surgery  2008    multiple  . Coronary artery bypass graft  03/19/2012    Procedure: CORONARY ARTERY BYPASS GRAFTING (CABG);  Surgeon: Rexene Alberts, MD;  Location: [place];  Service: Open Heart Surgery;  Laterality: N/A;  (B) MAMMARY  . Av fistula placement    . Left heart catheterization with coronary angiogram N/A 03/16/2012    Procedure: LEFT HEART CATHETERIZATION WITH CORONARY ANGIOGRAM;  Surgeon: Sherren Mocha, MD;  Location: [facility] CATH LAB;  Service: Cardiovascular;  Laterality: N/A;  . Atrial flutter ablation N/A 11/09/2012  Procedure: ATRIAL FLUTTER ABLATION;  Surgeon: [name], MD;  Location: [facility] CATH LAB;  Service: Cardiovascular;  Laterality: N/A;     Home Meds: Prior to Admission medications   Medication Sig Start Date End Date Taking? Authorizing Provider  allopurinol (ZYLOPRIM) 100 MG tablet Take 100 mg by mouth 2 (two) times daily.    Yes Historical Provider, MD  amLODipine (NORVASC) 5 MG tablet Take 5 mg by mouth 2 (two) times daily.   Yes Historical Provider, MD  aspirin 81 MG EC tablet Take 1 tablet (81 mg total) by mouth daily. 10/28/12  Yes Rogelia Mire, NP  cloNIDine (CATAPRES)  0.2 MG tablet Take 0.2 mg by mouth daily.    Yes Historical Provider, MD  fenofibrate micronized (LOFIBRA) 134 MG capsule Take 134 mg by mouth daily before breakfast.   Yes Historical Provider, MD  Magnesium 400 MG CAPS Take by mouth daily.   Yes Historical Provider, MD  metoprolol (LOPRESSOR) 50 MG tablet Take 0.5 tablets (25 mg total) by mouth 3 (three) times daily. 11/09/12  Yes Rogelia Mire, NP  mycophenolate (MYFORTIC) 180 MG EC tablet Take by mouth 2 (two) times daily. Take 4 tabs in the a.m and 4 tabs in the p.m.   Yes Historical Provider, MD  nitroGLYCERIN (NITROSTAT) 0.4 MG SL tablet Place 0.4 mg under the tongue every 5 (five) minutes x 3 doses as needed. For chest pain   Yes Historical Provider, MD  omega-3 acid ethyl esters (LOVAZA) 1 G capsule Take 2 g by mouth 2 (two) times daily.   Yes Historical Provider, MD  omeprazole (PRILOSEC) 10 MG capsule Take 10 mg by mouth daily.   Yes Historical Provider, MD  prednisoLONE 5 MG TABS tablet Take 5 mg by mouth daily.   Yes Historical Provider, MD  simvastatin (ZOCOR) 40 MG tablet Take 40 mg by mouth daily.   Yes Historical Provider, MD  sulfamethoxazole-trimethoprim (BACTRIM DS) 800-160 MG per tablet Take 1 tablet by mouth 2 (two) times daily. Take 1 tab 3 times a week mon, wedns, fri   Yes Historical Provider, MD  tacrolimus (PROGRAF) 1 MG capsule Take 1 mg by mouth 2 (two) times daily.    Yes Historical Provider, MD      Allergies:  Allergies  Allergen Reactions  . Penicillins Itching and Rash  . Contrast Media [Iodinated Diagnostic Agents]     coded    History   Social History  . Marital Status: Married    Spouse Name: N/A  . Number of Children: N/A  . Years of Education: N/A   Occupational History  . Not on file.   Social History Main Topics  . Smoking status: Former Smoker -- 0.30 packs/day for 30 years    Types: Cigarettes    Quit date: 03/15/2012  . Smokeless tobacco: Never Used  . Alcohol Use: Yes      Comment: 04/13/12 "2 mixed drinks/month"  . Drug Use: No  . Sexual Activity: Yes   Other Topics Concern  . Not on file   Social History Narrative     Family History  Problem Relation Age of Onset  . Adopted: Yes     ROS:  Please see the history of present illness.     All other systems reviewed and negative.    Physical Exam:   Blood pressure 130/76, pulse 71, height 5\' 8"  (1.727 m), weight 225 lb 3.2 oz (102.15 kg). General: Well developed, well nourished male in no acute  distress. Head: Normocephalic, atraumatic, sclera non-icteric, no xanthomas, nares are without discharge. EENT: normal Lymph Nodes:  none Back: without scoliosis/kyphosis , no CVA tendersness Neck: Negative for carotid bruits. JVD not elevated. Lungs: Clear bilaterally to auscultation without wheezes, rales, or rhonchi. Breathing is unlabored. Heart: RRR with S1 S2. 2 /6 systolic murmur , rubs, or gallops appreciated. Abdomen: Soft, non-tender, non-distended with normoactive bowel sounds. No hepatomegaly. No rebound/guarding. No obvious abdominal masses. Msk:  Strength and tone appear normal for age. Extremities: No clubbing or cyanosis. No  edema.  Distal pedal pulses are 2+ and equal bilaterally. Skin: Warm and Dry Neuro: Alert and oriented X 3. CN III-XII intact Grossly normal sensory and motor function . Psych:  Responds to questions appropriately with a normal affect.      Labs: Cardiac Enzymes No results for input(s): CKTOTAL, CKMB, TROPONINI in the last 72 hours. CBC Lab Results  Component Value Date   WBC 6.2 11/07/2012   HGB 17.0 11/21/2014   HCT 40.8 11/07/2012   MCV 86.3 11/07/2012   PLT 189 11/07/2012   PROTIME: No results for input(s): LABPROT, INR in the last 72 hours. Chemistry No results for input(s): NA, K, CL, CO2, BUN, CREATININE, CALCIUM, PROT, BILITOT, ALKPHOS, ALT, AST, GLUCOSE in the last 168 hours.  Invalid input(s): LABALBU Lipids No results found for: CHOL, HDL,  LDLCALC, TRIG BNP No results found for: PROBNP Thyroid Function Tests: No results for input(s): TSH, T4TOTAL, T3FREE, THYROIDAB in the last 72 hours.  Invalid input(s): FREET3    Miscellaneous No results found for: DDIMER  Radiology/Studies:  No results found.  EKG Sinus 71 15/11/44 LVH QRS widening freq PACs  Assessment and Plan:  Atrial Flutter  S/p RFCA  Freq PACs  LAE  LVH  Edema  Sleep disordered breathing   He is frequent PACs and symptoms that are reminiscent of his atrial flutter and concerning for atrial fibrillation. These episodes are 30 minutes in duration and would inform decision-making regarding anticoagulation given his prior stroke and his hypertension. Hence, we will use AliveCor monitor to help identify the mechanism of his palpitations. I have discussed with Dr. Lorrene Reid the role of anticoagulation she has no problems with that if it is needed.  Related to his atrial arrhythmia, the data regarding atrial fibrillation and sleep apnea are increasingly impressive; given his sleep disorder breathing we will undertake a sleep study and we'll try to arrange a for him at home. He had a negative sleep study many years ago and 40 pounds ago.  He has significant edema which has been worsened since initiation of amlodipine. This is a consequence of a Paramedic drug change. We will discontinue the calcium blocker. I reviewed with Dr. Lorrene Reid and we will discontinue his metoprolol. We will use labetalol at 200 mg twice daily as an alternative. He will continue to use his metoprolol when necessary for his prolonged arrhythmia  I agree with Dr. Mariana Single that it is likely that atrial fibrillation will ensue given his left atrial size. If we end of documented atrial fibrillation, I would have a low threshold for consideration of catheter ablation although the large size refill less effective therapy        Virl Axe

## 2015-04-03 ENCOUNTER — Institutional Professional Consult (permissible substitution): Payer: 59 | Admitting: Internal Medicine

## 2015-04-11 ENCOUNTER — Telehealth: Payer: Self-pay | Admitting: Internal Medicine

## 2015-04-11 NOTE — Telephone Encounter (Signed)
New Message    Patient is calling bc there was suppose to be a sleep study ordered for him. Please give patient a call to discuss.

## 2015-04-12 NOTE — Telephone Encounter (Signed)
Informed patient that I would set up home sleep study tomorrow morning. Patient verbalized understanding and agreeable to plan.

## 2015-04-12 NOTE — Telephone Encounter (Signed)
F/u ° ° °Pt returning your call °

## 2015-04-13 DIAGNOSIS — Z940 Kidney transplant status: Secondary | ICD-10-CM | POA: Diagnosis not present

## 2015-05-05 DIAGNOSIS — Z940 Kidney transplant status: Secondary | ICD-10-CM | POA: Diagnosis not present

## 2015-05-08 DIAGNOSIS — N2581 Secondary hyperparathyroidism of renal origin: Secondary | ICD-10-CM | POA: Diagnosis not present

## 2015-05-08 DIAGNOSIS — I129 Hypertensive chronic kidney disease with stage 1 through stage 4 chronic kidney disease, or unspecified chronic kidney disease: Secondary | ICD-10-CM | POA: Diagnosis not present

## 2015-05-08 DIAGNOSIS — Z940 Kidney transplant status: Secondary | ICD-10-CM | POA: Diagnosis not present

## 2015-05-08 DIAGNOSIS — R739 Hyperglycemia, unspecified: Secondary | ICD-10-CM | POA: Diagnosis not present

## 2015-05-18 ENCOUNTER — Emergency Department (HOSPITAL_COMMUNITY): Payer: 59

## 2015-05-18 ENCOUNTER — Encounter (HOSPITAL_COMMUNITY): Payer: Self-pay | Admitting: Emergency Medicine

## 2015-05-18 ENCOUNTER — Emergency Department (HOSPITAL_COMMUNITY)
Admission: EM | Admit: 2015-05-18 | Discharge: 2015-05-18 | Disposition: A | Discharge status: Home / Self Care | Payer: 59 | Attending: Emergency Medicine | Admitting: Emergency Medicine

## 2015-05-18 DIAGNOSIS — Z872 Personal history of diseases of the skin and subcutaneous tissue: Secondary | ICD-10-CM | POA: Insufficient documentation

## 2015-05-18 DIAGNOSIS — R079 Chest pain, unspecified: Secondary | ICD-10-CM | POA: Diagnosis present

## 2015-05-18 DIAGNOSIS — I4892 Unspecified atrial flutter: Secondary | ICD-10-CM | POA: Diagnosis not present

## 2015-05-18 DIAGNOSIS — Z7982 Long term (current) use of aspirin: Secondary | ICD-10-CM | POA: Insufficient documentation

## 2015-05-18 DIAGNOSIS — Z9861 Coronary angioplasty status: Secondary | ICD-10-CM | POA: Diagnosis not present

## 2015-05-18 DIAGNOSIS — N185 Chronic kidney disease, stage 5: Secondary | ICD-10-CM | POA: Insufficient documentation

## 2015-05-18 DIAGNOSIS — Z8673 Personal history of transient ischemic attack (TIA), and cerebral infarction without residual deficits: Secondary | ICD-10-CM | POA: Insufficient documentation

## 2015-05-18 DIAGNOSIS — I2510 Atherosclerotic heart disease of native coronary artery without angina pectoris: Secondary | ICD-10-CM | POA: Insufficient documentation

## 2015-05-18 DIAGNOSIS — I120 Hypertensive chronic kidney disease with stage 5 chronic kidney disease or end stage renal disease: Secondary | ICD-10-CM | POA: Insufficient documentation

## 2015-05-18 DIAGNOSIS — Z880 Allergy status to penicillin: Secondary | ICD-10-CM | POA: Diagnosis not present

## 2015-05-18 DIAGNOSIS — Z87891 Personal history of nicotine dependence: Secondary | ICD-10-CM | POA: Diagnosis not present

## 2015-05-18 DIAGNOSIS — Z951 Presence of aortocoronary bypass graft: Secondary | ICD-10-CM | POA: Diagnosis not present

## 2015-05-18 DIAGNOSIS — E780 Pure hypercholesterolemia: Secondary | ICD-10-CM | POA: Diagnosis not present

## 2015-05-18 DIAGNOSIS — Z79899 Other long term (current) drug therapy: Secondary | ICD-10-CM | POA: Diagnosis not present

## 2015-05-18 DIAGNOSIS — G43909 Migraine, unspecified, not intractable, without status migrainosus: Secondary | ICD-10-CM | POA: Diagnosis not present

## 2015-05-18 DIAGNOSIS — M109 Gout, unspecified: Secondary | ICD-10-CM | POA: Diagnosis not present

## 2015-05-18 LAB — CBC
HEMATOCRIT: 54.3 % — AB (ref 39.0–52.0)
Hemoglobin: 17.1 g/dL — ABNORMAL HIGH (ref 13.0–17.0)
MCH: 26.7 pg (ref 26.0–34.0)
MCHC: 31.5 g/dL (ref 30.0–36.0)
MCV: 84.8 fL (ref 78.0–100.0)
Platelets: 174 10*3/uL (ref 150–400)
RBC: 6.4 MIL/uL — ABNORMAL HIGH (ref 4.22–5.81)
RDW: 15.1 % (ref 11.5–15.5)
WBC: 7.7 10*3/uL (ref 4.0–10.5)

## 2015-05-18 LAB — BASIC METABOLIC PANEL
Anion gap: 9 (ref 5–15)
BUN: 24 mg/dL — AB (ref 6–20)
CO2: 23 mmol/L (ref 22–32)
CREATININE: 1.85 mg/dL — AB (ref 0.61–1.24)
Calcium: 9.8 mg/dL (ref 8.9–10.3)
Chloride: 107 mmol/L (ref 101–111)
GFR calc Af Amer: 47 mL/min — ABNORMAL LOW (ref >60)
GFR, EST NON AFRICAN AMERICAN: 41 mL/min — AB (ref >60)
GLUCOSE: 158 mg/dL — AB (ref 65–99)
Potassium: 5 mmol/L (ref 3.5–5.1)
Sodium: 139 mmol/L (ref 135–145)

## 2015-05-18 LAB — MAGNESIUM: Magnesium: 1.9 mg/dL (ref 1.7–2.4)

## 2015-05-18 LAB — I-STAT TROPONIN, ED
TROPONIN I, POC: 0.09 ng/mL — AB (ref 0.00–0.08)
Troponin i, poc: 0.1 ng/mL (ref 0.00–0.08)

## 2015-05-18 LAB — BRAIN NATRIURETIC PEPTIDE: B NATRIURETIC PEPTIDE 5: 339.9 pg/mL — AB (ref 0.0–100.0)

## 2015-05-18 MED ORDER — SODIUM CHLORIDE 0.9 % IV BOLUS (SEPSIS)
500.0000 mL | Freq: Once | INTRAVENOUS | Status: AC
  Administered 2015-05-18: 500 mL via INTRAVENOUS

## 2015-05-18 MED ORDER — NITROGLYCERIN 0.4 MG SL SUBL
0.4000 mg | SUBLINGUAL_TABLET | SUBLINGUAL | Status: DC | PRN
  Administered 2015-05-18: 0.4 mg via SUBLINGUAL
  Filled 2015-05-18 (×2): qty 1

## 2015-05-18 MED ORDER — ASPIRIN 325 MG PO TABS
325.0000 mg | ORAL_TABLET | Freq: Once | ORAL | Status: AC
  Administered 2015-05-18: 325 mg via ORAL
  Filled 2015-05-18: qty 1

## 2015-05-18 MED ORDER — DILTIAZEM HCL 25 MG/5ML IV SOLN
15.0000 mg | Freq: Once | INTRAVENOUS | Status: AC
  Administered 2015-05-18: 15 mg via INTRAVENOUS
  Filled 2015-05-18: qty 5

## 2015-05-18 NOTE — ED Notes (Signed)
Notified nurse of critical lab results and nurse notified edp

## 2015-05-18 NOTE — ED Notes (Signed)
Pt ambulated to restroom without assistance, steady gait.

## 2015-05-18 NOTE — Discharge Instructions (Signed)
Chest Pain Observation It is often hard to give a specific diagnosis for the cause of chest pain. Among other possibilities your symptoms might be caused by inadequate oxygen delivery to your heart (angina). Angina that is not treated or evaluated can lead to a heart attack (myocardial infarction) or death. Blood tests, electrocardiograms, and X-rays may have been done to help determine a possible cause of your chest pain. After evaluation and observation, your health care provider has determined that it is unlikely your pain was caused by an unstable condition that requires hospitalization. However, a full evaluation of your pain may need to be completed, with additional diagnostic testing as directed. It is very important to keep your follow-up appointments. Not keeping your follow-up appointments could result in permanent heart damage, disability, or death. If there is any problem keeping your follow-up appointments, you must call your health care provider. HOME CARE INSTRUCTIONS  Due to the slight chance that your pain could be angina, it is important to follow your health care provider's treatment plan and also maintain a healthy lifestyle:  Maintain or work toward achieving a healthy weight.  Stay physically active and exercise regularly.  Decrease your salt intake.  Eat a balanced, healthy diet. Talk to a dietitian to learn about heart-healthy foods.  Increase your fiber intake by including whole grains, vegetables, fruits, and nuts in your diet.  Avoid situations that cause stress, anger, or depression.  Take medicines as advised by your health care provider. Report any side effects to your health care provider. Do not stop medicines or adjust the dosages on your own.  Quit smoking. Do not use nicotine patches or gum until you check with your health care provider.  Keep your blood pressure, blood sugar, and cholesterol levels within normal limits.  Limit alcohol intake to no more  than 1 drink per day for women who are not pregnant and 2 drinks per day for men.  Do not abuse drugs. SEEK IMMEDIATE MEDICAL CARE IF: You have severe chest pain or pressure which may include symptoms such as:  You feel pain or pressure in your arms, neck, jaw, or back.  You have severe back or abdominal pain, feel sick to your stomach (nauseous), or throw up (vomit).  You are sweating profusely.  You are having a fast or irregular heartbeat.  You feel short of breath while at rest.  You notice increasing shortness of breath during rest, sleep, or with activity.  You have chest pain that does not get better after rest or after taking your usual medicine.  You wake from sleep with chest pain.  You are unable to sleep because you cannot breathe.  You develop a frequent cough or you are coughing up blood.  You feel dizzy, faint, or experience extreme fatigue.  You develop severe weakness, dizziness, fainting, or chills. Any of these symptoms may represent a serious problem that is an emergency. Do not wait to see if the symptoms will go away. Call your local emergency services (911 in the [place] Do not drive yourself to the hospital. MAKE SURE YOU:  Understand these instructions.  Will watch your condition.  Will get help right away if you are not doing well or get worse. Document Released: 12/21/2010 Document Revised: 11/23/2013 Document Reviewed: 05/20/2013 [facility] Patient Information 2015 [place], [place]. This information is not intended to replace advice given to you by your health care provider. Make sure you discuss any questions you have with your health care provider.  Atrial Flutter Atrial flutter is a heart rhythm that can cause the heart to beat very fast (tachycardia). It originates in the upper chambers of the heart (atria). In atrial flutter, the top chambers of the heart (atria) often beat much faster than the bottom chambers of the heart (ventricles). Atrial  flutter has a regular "saw toothed" appearance in an EKG readout. An EKG is a test that records the electrical activity of the heart. Atrial flutter can cause the heart to beat up to 150 beats per minute (BPM). Atrial flutter can either be short lived (paroxysmal) or permanent.  CAUSES  Causes of atrial flutter can be many. Some of these include:  Heart related issues:  Heart attack (myocardial infarction).  Heart failure.  Heart valve problems.  Poorly controlled high blood pressure (hypertension).  After open heart surgery.  Lung related issues:  A blood clot in the lungs (pulmonary embolism).  Chronic obstructive pulmonary disease (COPD). Medications used to treat COPD can attribute to atrial flutter.  Other related causes:  Hyperthyroidism.  Caffeine.  Some decongestant cold medications.  Low electrolyte levels such as potassium or magnesium.  Cocaine. SYMPTOMS  An awareness of your heart beating rapidly (palpitations).  Shortness of breath.  Chest pain.  Low blood pressure (hypotension).  Dizziness or fainting. DIAGNOSIS  Different tests can be performed to diagnose atrial flutter.   An EKG.  Holter monitor. This is a 24-hour recording of your heart rhythm. You will also be given a diary. Write down all symptoms that you have and what you were doing at the time you experienced symptoms.  Cardiac event monitor. This small device can be worn for up to 30 days. When you have heart symptoms, you will push a button on the device. This will then record your heart rhythm.  Echocardiogram. This is an imaging test to look at your heart. Your caregiver will look at your heart valves and the ventricles.  Stress test. This test can help determine if the atrial flutter is related to exercise or if coronary artery disease is present.  Laboratory studies will look at certain blood levels like:  Complete blood count (CBC).  Potassium.  Magnesium.  Thyroid  function. TREATMENT  Treatment of atrial flutter varies. A combination of therapies may be used or sometimes atrial flutter may need only 1 type of treatment.  Lab work: If your blood work, such as your electrolytes (potassium, magnesium) or your thyroid function tests, are abnormal, your caregiver will treat them accordingly.  Medication:  There are several different types of medications that can convert your heart to a normal rhythm and prevent atrial flutter from reoccurring.  Nonsurgical procedures: Nonsurgical techniques may be used to control atrial flutter. Some examples include:  Cardioversion. This technique uses either drugs or an electrical shock to restore a normal heart rhythm:  Cardioversion drugs may be given through an intravenous (IV) line to help "reset" the heart rhythm.  In electrical cardioversion, your caregiver shocks your heart with electrical energy. This helps to reset the heartbeat to a normal rhythm.  Ablation. If atrial flutter is a persistent problem, an ablation may be needed. This procedure is done under mild sedation. High frequency radio-wave energy is used to destroy the area of heart tissue responsible for atrial flutter. SEEK IMMEDIATE MEDICAL CARE IF:  You have:  Dizziness.  Near fainting or fainting.  Shortness of breath.  Chest pain or pressure.  Sudden nausea or vomiting.  Profuse sweating. If you have the above  symptoms, call your local emergency service immediately! Do not drive yourself to the hospital. MAKE SURE YOU:   Understand these instructions.  Will watch your condition.  Will get help right away if you are not doing well or get worse. Document Released: 04/06/2009 Document Revised: 04/04/2014 Document Reviewed: 04/06/2009 [facility] Patient Information 2015 [place], [place]. This information is not intended to replace advice given to you by your health care provider. Make sure you discuss any questions you have with your health  care provider.

## 2015-05-18 NOTE — ED Notes (Signed)
Pt reports no active CP at this time since receiving IV Cardizem.

## 2015-05-18 NOTE — ED Notes (Signed)
Patient complains of pain 10/10, wanted to hold off on second dose of nitro

## 2015-05-18 NOTE — ED Notes (Signed)
Pt wants magnesium drawn from IV.  Informed RN.

## 2015-05-18 NOTE — ED Notes (Signed)
Pt states that he began having chest pain, diaphoresis, heart racing around 1600 today.

## 2015-05-18 NOTE — ED Provider Notes (Signed)
CSN: [id number]     Arrival date & time 05/18/15  1825 History   First MD Initiated Contact with Patient 05/18/15 1837     Chief Complaint  Patient presents with  . Chest Pain     (Consider location/radiation/quality/duration/timing/severity/associated sxs/prior Treatment) Patient is a 54 y.o. male presenting with chest pain. The history is provided by the patient. No language interpreter was used.  Chest Pain Pain location:  Substernal area Pain quality: pressure   Pain quality comment:  Heaviness Pain radiates to:  Does not radiate Pain radiates to the back: no   Pain severity:  Moderate Onset quality:  Sudden Duration:  2 hours Timing:  Intermittent Progression:  Waxing and waning Chronicity:  New Context: at rest   Relieved by:  Nothing Worsened by:  Nothing tried Ineffective treatments: metoprolol. Associated symptoms: diaphoresis and shortness of breath   Associated symptoms: no abdominal pain, no back pain, no cough, no dizziness, no dysphagia, no fatigue, no fever, no headache, no nausea, no numbness, not vomiting and no weakness   Associated symptoms comment:  Palpitations   Past Medical History  Diagnosis Date  . Hypertension   . CAD (coronary artery disease)     a. s/p CABG 03/2012: LIMA to LAD, free RIMA to OM1, SVG to D1, sequential SVG to AM and LPLB2, EVH via right thigh and leg.  . Elevated cholesterol   . Ventricular hypertrophy   . Gout   . Peripheral vascular disease   . Migraines   . Stroke 1995; 2001    denies residual  . Cellulitis 04/13/2012    RLE saphenous vein harvest incision   . Hyperparathyroidism   . Superficial vein thrombosis     a. R greater saphenous 04/2012  . PAF and Flutter     a. Afib post-op CABG; AFlutter 10/2012  . CKD (chronic kidney disease) stage 5, GFR less than 15 ml/min     a. Diagnosed age 87, awaiting transplant  . Sinus node dysfunction-post termination pause     a. >8sec in setting of aflutter 10/2012   Past  Surgical History  Procedure Laterality Date  . Cardiac catheterization  03/16/12  . Renal artery stent  2001  . Fracture surgery    . Av fistula placement  2011    left forearm  . Elbow fracture surgery  1972    left  . Hernia repair  96/2952    umbilical  . Inguinal hernia repair  09/2009    bilaterally  . Dental surgery  2008    multiple  . Coronary artery bypass graft  03/19/2012    Procedure: CORONARY ARTERY BYPASS GRAFTING (CABG);  Surgeon: Rexene Alberts, MD;  Location: [facility];  Service: Open Heart Surgery;  Laterality: N/A;  (B) MAMMARY  . Av fistula placement    . Left heart catheterization with coronary angiogram N/A 03/16/2012    Procedure: LEFT HEART CATHETERIZATION WITH CORONARY ANGIOGRAM;  Surgeon: Sherren Mocha, MD;  Location: [facility] CATH LAB;  Service: Cardiovascular;  Laterality: N/A;  . Atrial flutter ablation N/A 11/09/2012    Procedure: ATRIAL FLUTTER ABLATION;  Surgeon: [name], MD;  Location: [facility] CATH LAB;  Service: Cardiovascular;  Laterality: N/A;   Family History  Problem Relation Age of Onset  . Adopted: Yes   History  Substance Use Topics  . Smoking status: Former Smoker -- 0.30 packs/day for 30 years    Types: Cigarettes    Quit date: 03/15/2012  . Smokeless tobacco: Never Used  .  Alcohol Use: Yes     Comment: 04/13/12 "2 mixed drinks/month"    Review of Systems  Constitutional: Positive for diaphoresis. Negative for fever, activity change, appetite change and fatigue.  HENT: Negative for congestion, facial swelling, rhinorrhea and trouble swallowing.   Eyes: Negative for photophobia and pain.  Respiratory: Positive for shortness of breath. Negative for cough and chest tightness.   Cardiovascular: Positive for chest pain. Negative for leg swelling.  Gastrointestinal: Negative for nausea, vomiting, abdominal pain, diarrhea and constipation.  Endocrine: Negative for polydipsia and polyuria.  Genitourinary: Negative for dysuria, urgency, decreased  urine volume and difficulty urinating.  Musculoskeletal: Negative for back pain and gait problem.  Skin: Negative for color change, rash and wound.  Allergic/Immunologic: Negative for immunocompromised state.  Neurological: Negative for dizziness, facial asymmetry, speech difficulty, weakness, numbness and headaches.  Psychiatric/Behavioral: Negative for confusion, decreased concentration and agitation.      Allergies  Penicillins and Contrast media  Home Medications   Prior to Admission medications   Medication Sig Start Date End Date Taking? Authorizing Provider  allopurinol (ZYLOPRIM) 100 MG tablet Take 200 mg by mouth daily.    Yes Historical Provider, MD  aspirin 81 MG EC tablet Take 1 tablet (81 mg total) by mouth daily. 10/28/12  Yes Rogelia Mire, NP  cloNIDine (CATAPRES) 0.2 MG tablet Take 0.2 mg by mouth daily.    Yes Historical Provider, MD  fenofibrate micronized (LOFIBRA) 134 MG capsule Take 134 mg by mouth daily before breakfast.   Yes Historical Provider, MD  labetalol (NORMODYNE) 200 MG tablet Take 1 tablet (200 mg total) by mouth 2 (two) times daily. 03/23/15  Yes [name], MD  Magnesium 400 MG CAPS Take 1 capsule by mouth daily.    Yes Historical Provider, MD  mycophenolate (MYFORTIC) 180 MG EC tablet Take 360 mg by mouth 2 (two) times daily. Take 4 tabs in the a.m and 4 tabs in the p.m.   Yes Historical Provider, MD  omega-3 acid ethyl esters (LOVAZA) 1 G capsule Take 2 g by mouth 2 (two) times daily.   Yes Historical Provider, MD  omeprazole (PRILOSEC) 10 MG capsule Take 10 mg by mouth daily.   Yes Historical Provider, MD  prednisoLONE 5 MG TABS tablet Take 5 mg by mouth at bedtime.    Yes Historical Provider, MD  simvastatin (ZOCOR) 40 MG tablet Take 40 mg by mouth at bedtime.    Yes Historical Provider, MD  sulfamethoxazole-trimethoprim (BACTRIM DS) 800-160 MG per tablet Take 1 tablet by mouth 3 (three) times a week. Take 1 tab by mouth 3 times a week on  Mon, Wed and Fri.   Yes Historical Provider, MD  tacrolimus (PROGRAF) 1 MG capsule Take 0.5-1 mg by mouth 2 (two) times daily. Take 1 mg in the am and Take 0.5 mg at bedtime.   Yes Historical Provider, MD  nitroGLYCERIN (NITROSTAT) 0.4 MG SL tablet Place 0.4 mg under the tongue every 5 (five) minutes x 3 doses as needed. For chest pain    Historical Provider, MD   BP 116/60 mmHg  Pulse 56  Temp(Src) 98.1 F (36.7 C) (Oral)  Resp 24  SpO2 97% Physical Exam  Constitutional: He is oriented to person, place, and time. He appears well-developed and well-nourished. No distress.  HENT:  Head: Normocephalic and atraumatic.  Mouth/Throat: No oropharyngeal exudate.  Eyes: Pupils are equal, round, and reactive to light.  Neck: Normal range of motion. Neck supple.  Cardiovascular: Normal heart  sounds.  An irregularly irregular rhythm present. Tachycardia present.  Exam reveals no gallop and no friction rub.   No murmur heard. Pulmonary/Chest: Effort normal and breath sounds normal. No respiratory distress. He has no wheezes. He has no rales.  Abdominal: Soft. Bowel sounds are normal. He exhibits no distension and no mass. There is no tenderness. There is no rebound and no guarding.  Musculoskeletal: Normal range of motion. He exhibits no edema or tenderness.  Neurological: He is alert and oriented to person, place, and time.  Skin: Skin is warm and dry.  Psychiatric: He has a normal mood and affect.    ED Course  Procedures (including critical care time) Labs Review Labs Reviewed  CBC - Abnormal; Notable for the following:    RBC 6.40 (*)    Hemoglobin 17.1 (*)    HCT 54.3 (*)    All other components within normal limits  BASIC METABOLIC PANEL - Abnormal; Notable for the following:    Glucose, Bld 158 (*)    BUN 24 (*)    Creatinine, Ser 1.85 (*)    GFR calc non Af Amer 41 (*)    GFR calc Af Amer 47 (*)    All other components within normal limits  BRAIN NATRIURETIC PEPTIDE - Abnormal;  Notable for the following:    B Natriuretic Peptide 339.9 (*)    All other components within normal limits  I-STAT TROPOININ, ED - Abnormal; Notable for the following:    Troponin i, poc 0.10 (*)    All other components within normal limits  I-STAT TROPOININ, ED - Abnormal; Notable for the following:    Troponin i, poc 0.09 (*)    All other components within normal limits  MAGNESIUM  I-STAT TROPOININ, ED    Imaging Review Dg Chest Port 1 View  05/18/2015   CLINICAL DATA:  Chest pain and shortness of Breath  EXAM: PORTABLE CHEST - 1 VIEW  COMPARISON:  09/16/2013  FINDINGS: Cardiac shadow is mildly enlarged but stable. Postsurgical changes are seen. Lungs are clear bilaterally. No acute bony abnormality is seen.  IMPRESSION: No acute abnormality is noted.   Electronically Signed   By: Inez Catalina M.D.   On: 05/18/2015 19:07     EKG Interpretation   Date/Time:  Thursday May 18 2015 19:50:30 EDT Ventricular Rate:  85 PR Interval:  158 QRS Duration: 135 QT Interval:  476 QTC Calculation: 566 R Axis:   -46 Text Interpretation:  Sinus rhythm Atrial premature complexes Left bundle  branch block ED PHYSICIAN INTERPRETATION AVAILABLE IN [facility]  Confirmed by TEST, Record ([id number]) on 05/19/2015 6:51:32 AM      MDM   Final diagnoses:  Chest pain  Paroxysmal atrial flutter    Pt is a 54 y.o. male with Pmhx as above who presents with sudden onset central chest tightness and heaviness with tachycardia palpitations at 4 PM at rest this evening.  He has had some associated shortness of breath.  He states he has had multiple similar episodes in the past with his history of paroxysmal A. fib/A flutter.  He took 100 mg of by mouth metoprolol at 4:15 and again at 5:15 without relief.  He denies any change of his medications any recent illnesses, no dehydration.  On physical exam, heart rate in 140s to 150s.  The pressure stable, lungs are clear.  He has slight pitting edema bilateral  lower extremities which is chronic and unchanged from patient.   10:49 AM patient is  received 10 mg of IV diltiazem Heart rate in the 50s and 60s with a sinus rhythm with PACs, .  Chest pain, shortness of breath and diaphoresis has resolved Troponin is mildly elevated at 0.10.  I will speak with cardiology  I spoke w/ Dr. Claiborne Billings of cardiology who rec delta trop. If not worsening a pt still asymptomatic would rec adding either BB or dilt.   Repeat trop 0.09 with is decreased. Pt still asymptomatic, would prefer to speak with his cardiologist tomorrow before starting a new med which I feel is appropriate.   Aloys Hupfer Ellena evaluation in the Emergency Department is complete. It has been determined that no acute conditions requiring further emergency intervention are present at this time. The patient/guardian have been advised of the diagnosis and plan. We have discussed signs and symptoms that warrant return to the ED, such as changes or worsening in symptoms, chest pain, return of palpitations.       Ernestina Patches, MD 05/19/15 1049

## 2015-05-22 ENCOUNTER — Telehealth: Payer: Self-pay | Admitting: *Deleted

## 2015-05-22 NOTE — Telephone Encounter (Signed)
PT  AWARE SCHEDULER TO   AND  MAKE  A APPT WITH  EP .Adonis Housekeeper

## 2015-05-22 NOTE — Telephone Encounter (Signed)
Follow Up  Pt returning [name]'s phone call- can use work # [phone]. Please call back and discuss.

## 2015-05-22 NOTE — Telephone Encounter (Signed)
Grieshaber, Takeo Harts - 05/18/15 >','<< Less Detail',event)" href="javascript:;">More Detail >>   Josue Hector, MD   Sent: Fri May 19, 2015 12:20 AM    To: Dionicio Stall, RN; Richmond Campbell, LPN        Message     Please have him see EP ASAP to consider ablation        Patient Information     Patient Name Sex DOB SSN    Trason, Shifflet Vuong Musa Male May 10, 1961 [id number]        ED Provider Notes by Ernestina Patches, MD at 05/18/2015 6:57 PM     Author: Ernestina Patches, MD Service: Emergency Medicine Author Type: Physician    Filed: 05/19/2015 10:49 AM Note Time: 05/18/2015 6:57 PM Status: Signed    Editor: Ernestina Patches, MD (Physician)     Expand All Collapse All   CSN: [id number] Arrival date & time 05/18/15 1825 History  First MD Initiated Contact with Patient 05/18/15 1837   Chief Complaint  Patient presents with  . Chest Pain     (Consider location/radiation/quality/duration/timing/severity/associated sxs/prior Treatment) Patient is a 54 y.o. male presenting with chest pain. The history is provided by the patient. No language interpreter was used.  Chest Pain Pain location: Substernal area Pain quality: pressure  Pain quality comment: Heaviness Pain radiates to: Does not radiate Pain radiates to the back: no  Pain severity: Moderate Onset quality: Sudden Duration: 2 hours Timing: Intermittent Progression: Waxing and waning Chronicity: New Context: at rest  Relieved by: Nothing Worsened by: Nothing tried Ineffective treatments: metoprolol. Associated symptoms: diaphoresis and shortness of breath  Associated symptoms: no abdominal pain, no back pain, no cough, no dizziness, no dysphagia, no fatigue, no fever, no headache, no nausea, no numbness, not vomiting and no weakness  Associated symptoms comment: Palpitations   Past Medical History  Diagnosis Date  . Hypertension   . CAD (coronary artery disease)      a. s/p CABG 03/2012: LIMA to LAD, free RIMA to OM1, SVG to D1, sequential SVG to AM and LPLB2, EVH via right thigh and leg.  . Elevated cholesterol   . Ventricular hypertrophy   . Gout   . Peripheral vascular disease   . Migraines   . Stroke 1995; 2001    denies residual  . Cellulitis 04/13/2012    RLE saphenous vein harvest incision   . Hyperparathyroidism   . Superficial vein thrombosis     a. R greater saphenous 04/2012  . PAF and Flutter     a. Afib post-op CABG; AFlutter 10/2012  . CKD (chronic kidney disease) stage 5, GFR less than 15 ml/min     a. Diagnosed age 41, awaiting transplant  . Sinus node dysfunction-post termination pause     a. >8sec in setting of aflutter 10/2012   Past Surgical History  Procedure Laterality Date  . Cardiac catheterization  03/16/12  . Renal artery stent  2001  . Fracture surgery    . Av fistula placement  2011    left forearm  . Elbow fracture surgery  1972    left  . Hernia repair  76/7209    umbilical  . Inguinal hernia repair  09/2009    bilaterally  . Dental surgery  2008    multiple  . Coronary artery bypass graft  03/19/2012    Procedure: CORONARY ARTERY BYPASS GRAFTING (CABG); Surgeon: Rexene Alberts, MD; Location: [facility]; Service: Open Heart Surgery; Laterality: N/A; (B) MAMMARY  . Av fistula  placement    . Left heart catheterization with coronary angiogram N/A 03/16/2012    Procedure: LEFT HEART CATHETERIZATION WITH CORONARY ANGIOGRAM; Surgeon: Sherren Mocha, MD; Location: [facility] CATH LAB; Service: Cardiovascular; Laterality: N/A;  . Atrial flutter ablation N/A 11/09/2012    Procedure: ATRIAL FLUTTER ABLATION; Surgeon: [name], MD; Location: [facility] CATH LAB; Service: Cardiovascular; Laterality: N/A;   Family History  Problem Relation Age of Onset  . Adopted: Yes   History   Substance Use Topics  . Smoking status: Former Smoker -- 0.30 packs/day for 30 years    Types: Cigarettes    Quit date: 03/15/2012  . Smokeless tobacco: Never Used  . Alcohol Use: Yes     Comment: 04/13/12 "2 mixed drinks/month"    Review of Systems  Constitutional: Positive for diaphoresis. Negative for fever, activity change, appetite change and fatigue.  HENT: Negative for congestion, facial swelling, rhinorrhea and trouble swallowing.  Eyes: Negative for photophobia and pain.  Respiratory: Positive for shortness of breath. Negative for cough and chest tightness.  Cardiovascular: Positive for chest pain. Negative for leg swelling.  Gastrointestinal: Negative for nausea, vomiting, abdominal pain, diarrhea and constipation.  Endocrine: Negative for polydipsia and polyuria.  Genitourinary: Negative for dysuria, urgency, decreased urine volume and difficulty urinating.  Musculoskeletal: Negative for back pain and gait problem.  Skin: Negative for color change, rash and wound.  Allergic/Immunologic: Negative for immunocompromised state.  Neurological: Negative for dizziness, facial asymmetry, speech difficulty, weakness, numbness and headaches.  Psychiatric/Behavioral: Negative for confusion, decreased concentration and agitation.      Allergies  Penicillins and Contrast media  Home Medications   Prior to Admission medications   Medication Sig Start Date End Date Taking? Authorizing Provider  allopurinol (ZYLOPRIM) 100 MG tablet Take 200 mg by mouth daily.    Yes Historical Provider, MD  aspirin 81 MG EC tablet Take 1 tablet (81 mg total) by mouth daily. 10/28/12  Yes Rogelia Mire, NP  cloNIDine (CATAPRES) 0.2 MG tablet Take 0.2 mg by mouth daily.    Yes Historical Provider, MD  fenofibrate micronized (LOFIBRA) 134 MG capsule Take 134 mg by mouth daily before breakfast.   Yes Historical Provider, MD  labetalol  (NORMODYNE) 200 MG tablet Take 1 tablet (200 mg total) by mouth 2 (two) times daily. 03/23/15  Yes [name], MD  Magnesium 400 MG CAPS Take 1 capsule by mouth daily.    Yes Historical Provider, MD  mycophenolate (MYFORTIC) 180 MG EC tablet Take 360 mg by mouth 2 (two) times daily. Take 4 tabs in the a.m and 4 tabs in the p.m.   Yes Historical Provider, MD  omega-3 acid ethyl esters (LOVAZA) 1 G capsule Take 2 g by mouth 2 (two) times daily.   Yes Historical Provider, MD  omeprazole (PRILOSEC) 10 MG capsule Take 10 mg by mouth daily.   Yes Historical Provider, MD  prednisoLONE 5 MG TABS tablet Take 5 mg by mouth at bedtime.    Yes Historical Provider, MD  simvastatin (ZOCOR) 40 MG tablet Take 40 mg by mouth at bedtime.    Yes Historical Provider, MD  sulfamethoxazole-trimethoprim (BACTRIM DS) 800-160 MG per tablet Take 1 tablet by mouth 3 (three) times a week. Take 1 tab by mouth 3 times a week on Mon, Wed and Fri.   Yes Historical Provider, MD  tacrolimus (PROGRAF) 1 MG capsule Take 0.5-1 mg by mouth 2 (two) times daily. Take 1 mg in the am and Take 0.5 mg at  bedtime.   Yes Historical Provider, MD  nitroGLYCERIN (NITROSTAT) 0.4 MG SL tablet Place 0.4 mg under the tongue every 5 (five) minutes x 3 doses as needed. For chest pain    Historical Provider, MD   BP 116/60 mmHg  Pulse 56  Temp(Src) 98.1 F (36.7 C) (Oral)  Resp 24  SpO2 97% Physical Exam  Constitutional: He is oriented to person, place, and time. He appears well-developed and well-nourished. No distress.  HENT:  Head: Normocephalic and atraumatic.  Mouth/Throat: No oropharyngeal exudate.  Eyes: Pupils are equal, round, and reactive to light.  Neck: Normal range of motion. Neck supple.  Cardiovascular: Normal heart sounds. An irregularly irregular rhythm present. Tachycardia present. Exam reveals no gallop and no friction rub.  No murmur heard. Pulmonary/Chest:  Effort normal and breath sounds normal. No respiratory distress. He has no wheezes. He has no rales.  Abdominal: Soft. Bowel sounds are normal. He exhibits no distension and no mass. There is no tenderness. There is no rebound and no guarding.  Musculoskeletal: Normal range of motion. He exhibits no edema or tenderness.  Neurological: He is alert and oriented to person, place, and time.  Skin: Skin is warm and dry.  Psychiatric: He has a normal mood and affect.    ED Course  Procedures (including critical care time) Labs Review Labs Reviewed  CBC - Abnormal; Notable for the following:    RBC 6.40 (*)    Hemoglobin 17.1 (*)    HCT 54.3 (*)    All other components within normal limits  BASIC METABOLIC PANEL - Abnormal; Notable for the following:    Glucose, Bld 158 (*)    BUN 24 (*)    Creatinine, Ser 1.85 (*)    GFR calc non Af Amer 41 (*)    GFR calc Af Amer 47 (*)    All other components within normal limits  BRAIN NATRIURETIC PEPTIDE - Abnormal; Notable for the following:    B Natriuretic Peptide 339.9 (*)    All other components within normal limits  I-STAT TROPOININ, ED - Abnormal; Notable for the following:    Troponin i, poc 0.10 (*)    All other components within normal limits  I-STAT TROPOININ, ED - Abnormal; Notable for the following:    Troponin i, poc 0.09 (*)    All other components within normal limits  MAGNESIUM  I-STAT TROPOININ, ED    Imaging Review  Imaging Results (Last 48 hours)    Dg Chest Port 1 View  05/18/2015 CLINICAL DATA: Chest pain and shortness of Breath EXAM: PORTABLE CHEST - 1 VIEW COMPARISON: 09/16/2013 FINDINGS: Cardiac shadow is mildly enlarged but stable. Postsurgical changes are seen. Lungs are clear bilaterally. No acute bony abnormality is seen. IMPRESSION: No acute abnormality is noted. Electronically Signed By: Inez Catalina M.D. On: 05/18/2015 19:07       EKG Interpretation   Date/Time: Thursday May 18 2015 19:50:30 EDT Ventricular Rate: 85 PR Interval: 158 QRS Duration: 135 QT Interval: 476 QTC Calculation: 566 R Axis: -46 Text Interpretation: Sinus rhythm Atrial premature complexes Left bundle  branch block ED PHYSICIAN INTERPRETATION AVAILABLE IN [facility]  Confirmed by TEST, Record ([id number]) on 05/19/2015 6:51:32 AM    MDM   Final diagnoses:  Chest pain  Paroxysmal atrial flutter    Pt is a 54 y.o. male with Pmhx as above who presents with sudden onset central chest tightness and heaviness with tachycardia palpitations at 4 PM at rest this evening. He has had  some associated shortness of breath. He states he has had multiple similar episodes in the past with his history of paroxysmal A. fib/A flutter. He took 100 mg of by mouth metoprolol at 4:15 and again at 5:15 without relief. He denies any change of his medications any recent illnesses, no dehydration. On physical exam, heart rate in 140s to 150s. The pressure stable, lungs are clear. He has slight pitting edema bilateral lower extremities which is chronic and unchanged from patient.   10:49 AM patient is received 10 mg of IV diltiazem Heart rate in the 50s and 60s with a sinus rhythm with PACs, . Chest pain, shortness of breath and diaphoresis has resolved Troponin is mildly elevated at 0.10. I will speak with cardiology  I spoke w/ Dr. Claiborne Billings of cardiology who rec delta trop. If not worsening a pt still asymptomatic would rec adding either BB or dilt.   Repeat trop 0.09 with is decreased. Pt still asymptomatic, would prefer to speak with his cardiologist tomorrow before starting a new med which I feel is appropriate.   Davied Nocito Runco evaluation in the Emergency Department is complete. It has been determined that no acute conditions requiring further emergency intervention are present at this time. The patient/guardian have been advised of the  diagnosis and plan. We have discussed signs and symptoms that warrant return to the ED, such as changes or worsening in symptoms, chest pain, return of palpitations.       Ernestina Patches, MD 05/19/15 1049         LM FOR PT  TO CALL BACK  WANTED TO LET PT   KNOW  SCHEDULER TO  CALL WITH  AN EP  APPT  FOR  POSSIBLE  ABLATION  PER  DR NISHAN.Marland Kitchen/

## 2015-05-24 DIAGNOSIS — D225 Melanocytic nevi of trunk: Secondary | ICD-10-CM | POA: Diagnosis not present

## 2015-05-24 DIAGNOSIS — L708 Other acne: Secondary | ICD-10-CM | POA: Diagnosis not present

## 2015-05-24 DIAGNOSIS — L720 Epidermal cyst: Secondary | ICD-10-CM | POA: Diagnosis not present

## 2015-05-24 DIAGNOSIS — D1801 Hemangioma of skin and subcutaneous tissue: Secondary | ICD-10-CM | POA: Diagnosis not present

## 2015-05-24 DIAGNOSIS — L821 Other seborrheic keratosis: Secondary | ICD-10-CM | POA: Diagnosis not present

## 2015-05-24 DIAGNOSIS — L820 Inflamed seborrheic keratosis: Secondary | ICD-10-CM | POA: Diagnosis not present

## 2015-05-24 DIAGNOSIS — L814 Other melanin hyperpigmentation: Secondary | ICD-10-CM | POA: Diagnosis not present

## 2015-05-24 DIAGNOSIS — Z86018 Personal history of other benign neoplasm: Secondary | ICD-10-CM | POA: Diagnosis not present

## 2015-06-02 ENCOUNTER — Encounter: Payer: Self-pay | Admitting: Internal Medicine

## 2015-06-02 ENCOUNTER — Ambulatory Visit (INDEPENDENT_AMBULATORY_CARE_PROVIDER_SITE_OTHER): Payer: 59 | Admitting: Internal Medicine

## 2015-06-02 VITALS — BP 120/80 | HR 63 | Ht 68.0 in | Wt 218.2 lb

## 2015-06-02 DIAGNOSIS — I25700 Atherosclerosis of coronary artery bypass graft(s), unspecified, with unstable angina pectoris: Secondary | ICD-10-CM

## 2015-06-02 DIAGNOSIS — I422 Other hypertrophic cardiomyopathy: Secondary | ICD-10-CM | POA: Diagnosis not present

## 2015-06-02 DIAGNOSIS — I480 Paroxysmal atrial fibrillation: Secondary | ICD-10-CM | POA: Diagnosis not present

## 2015-06-02 LAB — BASIC METABOLIC PANEL
BUN: 23 mg/dL (ref 6–23)
CO2: 26 meq/L (ref 19–32)
Calcium: 9.9 mg/dL (ref 8.4–10.5)
Chloride: 104 mEq/L (ref 96–112)
Creatinine, Ser: 1.68 mg/dL — ABNORMAL HIGH (ref 0.40–1.50)
GFR: 45.85 mL/min — ABNORMAL LOW (ref >60.00)
Glucose, Bld: 102 mg/dL — ABNORMAL HIGH (ref 70–99)
POTASSIUM: 3.9 meq/L (ref 3.5–5.1)
Sodium: 138 mEq/L (ref 135–145)

## 2015-06-02 NOTE — Progress Notes (Signed)
ELECTROPHYSIOLOGY progress NOTE  Patient ID: [name], MRN: [id number], DOB/AGE: [birth year] 54 y.o. Admit date: (Not on file) Date of Consult: 06/02/2015  Primary Physician: Gennette Pac, MD Primary Cardiologist: [phone] Chief Complaint: ATrial fib and PVC   HPI [name] is a 54 y.o. male  Seen at the request of Dr. Johnsie Cancel because of concern about the likelihood of atrial fibrillation developing the context of significant left atrial enlargement prior atrial flutter and left ventricular hypertrophy. He has had recurrent infrequent episodes of prolonged irregular tachycardia palpitations. These have been associated with fatigue and dyspnea and residual weakness. There reminiscent of his rhythm disturbances with atrial fibrillation.  Echocardiogram 9/15 demonstrated normal LV systolic function; qualitative comments were "severe concentric hypertrophy" although the measurements are 17/11 suggesting some degree of asymmetry Left atrial dimension was 53/2.4  ECG 3/16 demonstrated multiple atrial ectopic beats   He has a history of atrial flutter and posttermination pauses. He underwent catheter ablation in 2013.  He had a history of ischemic heart disease with prior bypass surgery 4/13 and had post CABG atrial fibrillation.  He has a history of remote stroke.  He also has kidney disease and underwent renal transplantation. He has trouble apparently with many episodes of rejection and is on steroids. He is cushingoid. His weight is up 40 pounds since transplant.  He also has a history of sleep disordered breathing and many years ago had a negative sleep study. He has however in the interval around 40 pounds as noted above. He has daytime somnolence   echocardiogram 9/15 demonstrated severe LVH and left atrial enlargement. (53/2.4) EF was normal.     Past Medical History  Diagnosis Date  . Hypertension   . CAD (coronary artery disease)     a. s/p CABG 03/2012: LIMA to LAD,  free RIMA to OM1, SVG to D1, sequential SVG to AM and LPLB2, EVH via right thigh and leg.  . Elevated cholesterol   . Ventricular hypertrophy   . Gout   . Peripheral vascular disease   . Migraines   . Stroke 1995; 2001    denies residual  . Cellulitis 04/13/2012    RLE saphenous vein harvest incision   . Hyperparathyroidism   . Superficial vein thrombosis     a. R greater saphenous 04/2012  . PAF and Flutter     a. Afib post-op CABG; AFlutter 10/2012  . CKD (chronic kidney disease) stage 5, GFR less than 15 ml/min     a. Diagnosed age 82, awaiting transplant  . Sinus node dysfunction-post termination pause     a. >8sec in setting of aflutter 10/2012      Surgical History:  Past Surgical History  Procedure Laterality Date  . Cardiac catheterization  03/16/12  . Renal artery stent  2001  . Fracture surgery    . Av fistula placement  2011    left forearm  . Elbow fracture surgery  1972    left  . Hernia repair  83/3825    umbilical  . Inguinal hernia repair  09/2009    bilaterally  . Dental surgery  2008    multiple  . Coronary artery bypass graft  03/19/2012    Procedure: CORONARY ARTERY BYPASS GRAFTING (CABG);  Surgeon: Rexene Alberts, MD;  Location: [place];  Service: Open Heart Surgery;  Laterality: N/A;  (B) MAMMARY  . Av fistula placement    . Left heart catheterization with coronary angiogram N/A 03/16/2012  Procedure: LEFT HEART CATHETERIZATION WITH CORONARY ANGIOGRAM;  Surgeon: Sherren Mocha, MD;  Location: [facility] CATH LAB;  Service: Cardiovascular;  Laterality: N/A;  . Atrial flutter ablation N/A 11/09/2012    Procedure: ATRIAL FLUTTER ABLATION;  Surgeon: [name], MD;  Location: [facility] CATH LAB;  Service: Cardiovascular;  Laterality: N/A;     Home Meds: Prior to Admission medications   Medication Sig Start Date End Date Taking? Authorizing Provider  allopurinol (ZYLOPRIM) 100 MG tablet Take 100 mg by mouth 2 (two) times daily.    Yes Historical Provider, MD    amLODipine (NORVASC) 5 MG tablet Take 5 mg by mouth 2 (two) times daily.   Yes Historical Provider, MD  aspirin 81 MG EC tablet Take 1 tablet (81 mg total) by mouth daily. 10/28/12  Yes Rogelia Mire, NP  cloNIDine (CATAPRES) 0.2 MG tablet Take 0.2 mg by mouth daily.    Yes Historical Provider, MD  fenofibrate micronized (LOFIBRA) 134 MG capsule Take 134 mg by mouth daily before breakfast.   Yes Historical Provider, MD  Magnesium 400 MG CAPS Take by mouth daily.   Yes Historical Provider, MD  metoprolol (LOPRESSOR) 50 MG tablet Take 0.5 tablets (25 mg total) by mouth 3 (three) times daily. 11/09/12  Yes Rogelia Mire, NP  mycophenolate (MYFORTIC) 180 MG EC tablet Take by mouth 2 (two) times daily. Take 4 tabs in the a.m and 4 tabs in the p.m.   Yes Historical Provider, MD  nitroGLYCERIN (NITROSTAT) 0.4 MG SL tablet Place 0.4 mg under the tongue every 5 (five) minutes x 3 doses as needed. For chest pain   Yes Historical Provider, MD  omega-3 acid ethyl esters (LOVAZA) 1 G capsule Take 2 g by mouth 2 (two) times daily.   Yes Historical Provider, MD  omeprazole (PRILOSEC) 10 MG capsule Take 10 mg by mouth daily.   Yes Historical Provider, MD  prednisoLONE 5 MG TABS tablet Take 5 mg by mouth daily.   Yes Historical Provider, MD  simvastatin (ZOCOR) 40 MG tablet Take 40 mg by mouth daily.   Yes Historical Provider, MD  sulfamethoxazole-trimethoprim (BACTRIM DS) 800-160 MG per tablet Take 1 tablet by mouth 2 (two) times daily. Take 1 tab 3 times a week mon, wedns, fri   Yes Historical Provider, MD  tacrolimus (PROGRAF) 1 MG capsule Take 1 mg by mouth 2 (two) times daily.    Yes Historical Provider, MD      Allergies:  Allergies  Allergen Reactions  . Penicillins Itching and Rash  . Contrast Media [Iodinated Diagnostic Agents]     coded    History   Social History  . Marital Status: Married    Spouse Name: N/A  . Number of Children: N/A  . Years of Education: N/A    Occupational History  . Not on file.   Social History Main Topics  . Smoking status: Former Smoker -- 0.30 packs/day for 30 years    Types: Cigarettes    Quit date: 03/15/2012  . Smokeless tobacco: Never Used  . Alcohol Use: Yes     Comment: 04/13/12 "2 mixed drinks/month"  . Drug Use: No  . Sexual Activity: Yes   Other Topics Concern  . Not on file   Social History Narrative     Family History  Problem Relation Age of Onset  . Adopted: Yes  . Family history unknown: Yes     ROS:  Please see the history of present illness.  All other systems reviewed and negative.    Physical Exam:   Blood pressure 120/80, pulse 63, height 5\' 8"  (1.727 m), weight 218 lb 3.2 oz (98.975 kg). General: Well developed, well nourished male in no acute distress. Head: Normocephalic, atraumatic, sclera non-icteric, no xanthomas, nares are without discharge. EENT: normal Lymph Nodes:  none Back: without scoliosis/kyphosis , no CVA tendersness Neck: Negative for carotid bruits. JVD not elevated. Lungs: Clear bilaterally to auscultation without wheezes, rales, or rhonchi. Breathing is unlabored. Heart: RRR with S1 S2. 2 /6 systolic murmur , rubs, or gallops appreciated. Abdomen: Soft, non-tender, non-distended with normoactive bowel sounds. No hepatomegaly. No rebound/guarding. No obvious abdominal masses. Msk:  Strength and tone appear normal for age. Extremities: No clubbing or cyanosis. No  edema.  Distal pedal pulses are 2+ and equal bilaterally. Skin: Warm and Dry Neuro: Alert and oriented X 3. CN III-XII intact Grossly normal sensory and motor function . Psych:  Responds to questions appropriately with a normal affect.      Labs: Cardiac Enzymes No results for input(s): CKTOTAL, CKMB, TROPONINI in the last 72 hours. CBC Lab Results  Component Value Date   WBC 7.7 05/18/2015   HGB 17.1* 05/18/2015   HCT 54.3* 05/18/2015   MCV 84.8 05/18/2015   PLT 174 05/18/2015    PROTIME: No results for input(s): LABPROT, INR in the last 72 hours. Chemistry No results for input(s): NA, K, CL, CO2, BUN, CREATININE, CALCIUM, PROT, BILITOT, ALKPHOS, ALT, AST, GLUCOSE in the last 168 hours.  Invalid input(s): LABALBU Lipids No results found for: CHOL, HDL, LDLCALC, TRIG BNP No results found for: PROBNP Thyroid Function Tests: No results for input(s): TSH, T4TOTAL, T3FREE, THYROIDAB in the last 72 hours.  Invalid input(s): FREET3    Miscellaneous No results found for: DDIMER  Radiology/Studies:  Dg Chest Port 1 View  05/18/2015   CLINICAL DATA:  Chest pain and shortness of Breath  EXAM: PORTABLE CHEST - 1 VIEW  COMPARISON:  09/16/2013  FINDINGS: Cardiac shadow is mildly enlarged but stable. Postsurgical changes are seen. Lungs are clear bilaterally. No acute bony abnormality is seen.  IMPRESSION: No acute abnormality is noted.   Electronically Signed   By: Inez Catalina M.D.   On: 05/18/2015 19:07    EKG Sinus 71 15/11/44 LVH QRS widening freq PACs  Assessment and Plan:  Atrial Flutter  S/p RFCA  Freq PACs  LAE  LVH-Asymmetric 18/11  Sleep disordered breathing   Review of echo raise concern of HCM ; this would have major impact on anticoagulation decision.  Current guidelines, HCM would prompt anticoagulation regardless of CHADS-VASc score.  His prior stroke in 2001 by my review suggests non-thromboembolic event based on the description is as lacunar  H will arrange to have his study reviewed by the original radiologist to clarifiy  I discussed issue of gadolinium enhancement with Dr.Dunham  We will recheck GFR and then make a decision about the enhanced value as we pursue MR  Atrial flutter that was detected on the ECG in the ER 05/18/15 might well represent a break to the cavotricuspid isthmus based on P wave morphologies evident in lead V1; flutter waves in the inferior leads are much harder to discern. The postconversion ECG shows frequent  atrial ectopy and again raises a concern as to the cause of his symptoms as whether they represent atrial tachycardia, atrial fibrillation or atrial flutter and this would further informed and decisions regarding the appropriateness of repeat cavo tricuspid isthmus ablation  We spent more than 50% of our >40in visit in face to face counseling regarding the above            Virl Axe

## 2015-06-02 NOTE — Patient Instructions (Signed)
Medication Instructions:  Your physician recommends that you continue on your current medications as directed. Please refer to the Current Medication list given to you today.  Labwork: BMET today  Testing/Procedures: Your physician has requested that you have a cardiac MRI. Cardiac MRI uses a computer to create images of your heart as its beating, producing both still and moving pictures of your heart and major blood vessels. For further information please visit [email or web address] Please follow the instruction sheet given to you today for more information.  Follow-Up: Your physician recommends that you schedule a follow-up appointment in: 3-4 weeks with Dr. Caryl Comes.   Any Other Special Instructions Will Be Listed Below (If Applicable). Thank you for choosing [facility]!!

## 2015-06-13 ENCOUNTER — Telehealth: Payer: Self-pay | Admitting: Internal Medicine

## 2015-06-13 DIAGNOSIS — I422 Other hypertrophic cardiomyopathy: Secondary | ICD-10-CM

## 2015-06-13 NOTE — Telephone Encounter (Signed)
New message      Question about MRI with or without contrast----scheduled for Friday.  Pt is a kidney transplant pt and want to avoid contrast if possible

## 2015-06-14 DIAGNOSIS — M109 Gout, unspecified: Secondary | ICD-10-CM | POA: Diagnosis not present

## 2015-06-14 DIAGNOSIS — Z940 Kidney transplant status: Secondary | ICD-10-CM | POA: Diagnosis not present

## 2015-06-14 NOTE — Telephone Encounter (Signed)
Tried calling pt x 3 and line was busy.  Will forward to [employer]

## 2015-06-14 NOTE — Telephone Encounter (Signed)
Follow up     Pt returning Sherri's call.  Please call to advise

## 2015-06-15 NOTE — Telephone Encounter (Signed)
Informed patient that I will change order to an MRI w/o contrast. 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 He thanks me for calling back.

## 2015-06-16 ENCOUNTER — Ambulatory Visit (HOSPITAL_COMMUNITY)
Admission: RE | Admit: 2015-06-16 | Discharge: 2015-06-16 | Disposition: A | Discharge status: Home / Self Care | Payer: 59 | Source: Ambulatory Visit | Attending: Internal Medicine | Admitting: Internal Medicine

## 2015-06-16 DIAGNOSIS — I422 Other hypertrophic cardiomyopathy: Secondary | ICD-10-CM | POA: Insufficient documentation

## 2015-06-29 ENCOUNTER — Ambulatory Visit (INDEPENDENT_AMBULATORY_CARE_PROVIDER_SITE_OTHER): Payer: 59 | Admitting: Internal Medicine

## 2015-06-29 ENCOUNTER — Encounter: Payer: Self-pay | Admitting: Internal Medicine

## 2015-06-29 VITALS — BP 126/82 | HR 70 | Ht 68.0 in | Wt 212.8 lb

## 2015-06-29 DIAGNOSIS — I483 Typical atrial flutter: Secondary | ICD-10-CM | POA: Diagnosis not present

## 2015-06-29 DIAGNOSIS — I422 Other hypertrophic cardiomyopathy: Secondary | ICD-10-CM

## 2015-06-29 DIAGNOSIS — I25700 Atherosclerosis of coronary artery bypass graft(s), unspecified, with unstable angina pectoris: Secondary | ICD-10-CM

## 2015-06-29 NOTE — Patient Instructions (Signed)
Medication Instructions:  Your physician recommends that you continue on your current medications as directed. Please refer to the Current Medication list given to you today.  Labwork: None ordered  Testing/Procedures: None ordered  Follow-Up: Your physician wants you to follow-up in: 6 months with Dr. Caryl Comes. You will receive a reminder letter in the mail two months in advance. If you don't receive a letter, please call our office to schedule the follow-up appointment.  Any Other Special Instructions Will Be Listed Below (If Applicable). We will contact you, in the next week or two, to discuss genetic testing.   Thank you for choosing [facility]!!

## 2015-06-29 NOTE — Progress Notes (Signed)
ELECTROPHYSIOLOGY progress NOTE  Patient ID: [name], MRN: [id number], DOB/AGE: [birth year] 54 y.o. Admit date: (Not on file) Date of Consult: 06/29/2015  Primary Physician: Gennette Pac, MD Primary Cardiologist: [phone] Chief Complaint: ATrial fib and PVC   HPI [name] is a 54 y.o. male  Seen at the request of Dr. Johnsie Cancel because of concern about the likelihood of atrial fibrillation developing the context of significant left atrial enlargement prior atrial flutter and left ventricular hypertrophy. He has had recurrent infrequent episodes of prolonged irregular tachycardia palpitations. These have been associated with fatigue and dyspnea and residual weakness. There reminiscent of his rhythm disturbances with atrial fibrillation.  Echocardiogram 9/15 demonstrated normal LV systolic function; qualitative comments were "severe concentric hypertrophy" although the measurements are 17/11 suggesting some degree of asymmetry Left atrial dimension was 53/2.4   submitted for MRI. 7/16 this demonstrated mid cavitary obliteration and asymmetric septal hypertrophy 18/11 consistent with HCM  ECG 3/16 demonstrated multiple atrial ectopic beats   He has a history of atrial flutter and posttermination pauses. He underwent catheter ablation in 2013.  He had a history of ischemic heart disease with prior bypass surgery 4/13 and had post CABG atrial fibrillation.  He has a history of remote stroke. He has history of palpitations. He was given AliveCor monitor. Symptoms were associated with PACs.  He also has kidney disease and underwent renal transplantation. He has trouble apparently with many episodes of rejection and is on steroids. He is cushingoid. His weight is up 40 pounds since transplant.  He also has a history of sleep disordered breathing and many years ago had a negative sleep study. He has however in the interval around 40 pounds as noted above. He has daytime somnolence       Past Medical History  Diagnosis Date  . Hypertension   . CAD (coronary artery disease)     a. s/p CABG 03/2012: LIMA to LAD, free RIMA to OM1, SVG to D1, sequential SVG to AM and LPLB2, EVH via right thigh and leg.  . Elevated cholesterol   . Ventricular hypertrophy   . Gout   . Peripheral vascular disease   . Migraines   . Stroke 1995; 2001    denies residual  . Cellulitis 04/13/2012    RLE saphenous vein harvest incision   . Hyperparathyroidism   . Superficial vein thrombosis     a. R greater saphenous 04/2012  . PAF and Flutter     a. Afib post-op CABG; AFlutter 10/2012  . CKD (chronic kidney disease) stage 5, GFR less than 15 ml/min     a. Diagnosed age 60, awaiting transplant  . Sinus node dysfunction-post termination pause     a. >8sec in setting of aflutter 10/2012      Surgical History:  Past Surgical History  Procedure Laterality Date  . Cardiac catheterization  03/16/12  . Renal artery stent  2001  . Fracture surgery    . Av fistula placement  2011    left forearm  . Elbow fracture surgery  1972    left  . Hernia repair  [id number]/3557    umbilical  . Inguinal hernia repair  09/2009    bilaterally  . Dental surgery  2008    multiple  . Coronary artery bypass graft  03/19/2012    Procedure: CORONARY ARTERY BYPASS GRAFTING (CABG);  Surgeon: Rexene Alberts, MD;  Location: [facility];  Service: Open Heart Surgery;  Laterality: N/A;  (B) MAMMARY  .  Av fistula placement    . Left heart catheterization with coronary angiogram N/A 03/16/2012    Procedure: LEFT HEART CATHETERIZATION WITH CORONARY ANGIOGRAM;  Surgeon: Sherren Mocha, MD;  Location: [facility] CATH LAB;  Service: Cardiovascular;  Laterality: N/A;  . Atrial flutter ablation N/A 11/09/2012    Procedure: ATRIAL FLUTTER ABLATION;  Surgeon: [name], MD;  Location: [facility] CATH LAB;  Service: Cardiovascular;  Laterality: N/A;     Home Meds: Prior to Admission medications   Medication Sig Start Date End Date Taking?  Authorizing Provider  allopurinol (ZYLOPRIM) 100 MG tablet Take 100 mg by mouth 2 (two) times daily.    Yes Historical Provider, MD  amLODipine (NORVASC) 5 MG tablet Take 5 mg by mouth 2 (two) times daily.   Yes Historical Provider, MD  aspirin 81 MG EC tablet Take 1 tablet (81 mg total) by mouth daily. 10/28/12  Yes Rogelia Mire, NP  cloNIDine (CATAPRES) 0.2 MG tablet Take 0.2 mg by mouth daily.    Yes Historical Provider, MD  fenofibrate micronized (LOFIBRA) 134 MG capsule Take 134 mg by mouth daily before breakfast.   Yes Historical Provider, MD  Magnesium 400 MG CAPS Take by mouth daily.   Yes Historical Provider, MD  metoprolol (LOPRESSOR) 50 MG tablet Take 0.5 tablets (25 mg total) by mouth 3 (three) times daily. 11/09/12  Yes Rogelia Mire, NP  mycophenolate (MYFORTIC) 180 MG EC tablet Take by mouth 2 (two) times daily. Take 4 tabs in the a.m and 4 tabs in the p.m.   Yes Historical Provider, MD  nitroGLYCERIN (NITROSTAT) 0.4 MG SL tablet Place 0.4 mg under the tongue every 5 (five) minutes x 3 doses as needed. For chest pain   Yes Historical Provider, MD  omega-3 acid ethyl esters (LOVAZA) 1 G capsule Take 2 g by mouth 2 (two) times daily.   Yes Historical Provider, MD  omeprazole (PRILOSEC) 10 MG capsule Take 10 mg by mouth daily.   Yes Historical Provider, MD  prednisoLONE 5 MG TABS tablet Take 5 mg by mouth daily.   Yes Historical Provider, MD  simvastatin (ZOCOR) 40 MG tablet Take 40 mg by mouth daily.   Yes Historical Provider, MD  sulfamethoxazole-trimethoprim (BACTRIM DS) 800-160 MG per tablet Take 1 tablet by mouth 2 (two) times daily. Take 1 tab 3 times a week mon, wedns, fri   Yes Historical Provider, MD  tacrolimus (PROGRAF) 1 MG capsule Take 1 mg by mouth 2 (two) times daily.    Yes Historical Provider, MD      Allergies:  Allergies  Allergen Reactions  . Contrast Media [Iodinated Diagnostic Agents]     coded  . Penicillins Itching and Rash    History    Social History  . Marital Status: Married    Spouse Name: N/A  . Number of Children: N/A  . Years of Education: N/A   Occupational History  . Not on file.   Social History Main Topics  . Smoking status: Former Smoker -- 0.30 packs/day for 30 years    Types: Cigarettes    Quit date: 03/15/2012  . Smokeless tobacco: Never Used  . Alcohol Use: Yes     Comment: 04/13/12 "2 mixed drinks/month"  . Drug Use: No  . Sexual Activity: Yes   Other Topics Concern  . Not on file   Social History Narrative     Family History  Problem Relation Age of Onset  . Adopted: Yes  . Family history unknown:  Yes     ROS:  Please see the history of present illness.     All other systems reviewed and negative.    Physical Exam:   Blood pressure 126/82, pulse 70, height 5\' 8"  (1.727 m), weight 212 lb 12.8 oz (96.525 kg). General: Well developed, well nourished male in no acute distress. Head: Normocephalic, atraumatic, sclera non-icteric, no xanthomas, nares are without discharge. EENT: normal Lymph Nodes:  none Back: without scoliosis/kyphosis , no CVA tendersness Neck: Negative for carotid bruits. JVD not elevated. Lungs: Clear bilaterally to auscultation without wheezes, rales, or rhonchi. Breathing is unlabored. Heart: RRR with S1 S2. 2 /6 systolic murmur , rubs, or gallops appreciated. Abdomen: Soft, non-tender, non-distended with normoactive bowel sounds. No hepatomegaly. No rebound/guarding. No obvious abdominal masses. Msk:  Strength and tone appear normal for age. Extremities: No clubbing or cyanosis. No  edema.  Distal pedal pulses are 2+ and equal bilaterally. Skin: Warm and Dry Neuro: Alert and oriented X 3. CN III-XII intact Grossly normal sensory and motor function . Psych:  Responds to questions appropriately with a normal affect.      Labs: Cardiac Enzymes No results for input(s): CKTOTAL, CKMB, TROPONINI in the last 72 hours. CBC Lab Results  Component Value Date    WBC 7.7 05/18/2015   HGB 17.1* 05/18/2015   HCT 54.3* 05/18/2015   MCV 84.8 05/18/2015   PLT 174 05/18/2015   PROTIME: No results for input(s): LABPROT, INR in the last 72 hours. Chemistry No results for input(s): NA, K, CL, CO2, BUN, CREATININE, CALCIUM, PROT, BILITOT, ALKPHOS, ALT, AST, GLUCOSE in the last 168 hours.  Invalid input(s): LABALBU Lipids No results found for: CHOL, HDL, LDLCALC, TRIG BNP No results found for: PROBNP Thyroid Function Tests: No results for input(s): TSH, T4TOTAL, T3FREE, THYROIDAB in the last 72 hours.  Invalid input(s): FREET3    Miscellaneous No results found for: DDIMER  Radiology/Studies:  Mr Card Morphology W/o Cm  06/16/2015   CLINICAL DATA:  CAD LVH?  HOCM  EXAM: CARDIAC MRI  TECHNIQUE: The patient was scanned on a 1.5 Tesla GE magnet. A dedicated cardiac coil was used. Functional imaging was done using Fiesta sequences. 2,3, and 4 chamber views were done to assess for RWMA's. Modified Simpson's rule using a short axis stack was used to calculate an ejection fraction on a dedicated work Conservation officer, nature. No gadolinium was used due to renal failure and transplant with GFR under 50  CONTRAST:  None  FINDINGS: There was moderate LAE. There was normal RA/RV. There was no ASD/VSD or pericardial effusion. Aortic root was upper limits of normal at 3.7. There was asymmetric septal hypertrophy measuring 18 mm extending the length of the septum. The posterior wall was 12 mm. There was near mid and apical cavity obliteration in systole with no SAM or obvious LVOT turbulence. The quantitative EF was 58% (EDV 140 cc, ESV 59 cc SV 81 cc.  IMPRESSION: 1) Findings consistent with non obstructive HOCM with asymmetric septal hypertrophy of 18 mm and near mid and apical cavity obliteration during systole  2) Quantitative EF 58%  3) Moderate LAE  In setting of PAF anticoagulation regardless of [place] Score would be indicated. Given renal issues coumadin may be  best choice  Jenkins Rouge   Electronically Signed   By: Jenkins Rouge M.D.   On: 06/16/2015 11:26    EKG Sinus 71 15/11/44 LVH QRS widening freq PACs  Assessment and Plan:  Atrial Flutter  S/p  RFCA  Freq PACs  LAE  LVH-Asymmetric 18/11  HCM    Sleep disordered breathing  Status post kidney transplant  The patient MR is consistent with HCM. He is adopted and so there is no horizontal children. He has 6 children. We will be in contact with Dr. Broadus John to pursue genetic evaluation and counseling  . For now we will continue his current medications.  AliveCor demonstrated palpitations showing atrial ectopy without atrial fibrillation  Intermittent atrial fibrillation were identified in the context of HCM anticoagulation would be indicated             Virl Axe

## 2015-07-12 DIAGNOSIS — I129 Hypertensive chronic kidney disease with stage 1 through stage 4 chronic kidney disease, or unspecified chronic kidney disease: Secondary | ICD-10-CM | POA: Diagnosis not present

## 2015-07-12 DIAGNOSIS — D751 Secondary polycythemia: Secondary | ICD-10-CM | POA: Diagnosis not present

## 2015-07-12 DIAGNOSIS — R739 Hyperglycemia, unspecified: Secondary | ICD-10-CM | POA: Diagnosis not present

## 2015-07-12 DIAGNOSIS — Z940 Kidney transplant status: Secondary | ICD-10-CM | POA: Diagnosis not present

## 2015-07-26 ENCOUNTER — Telehealth: Payer: Self-pay | Admitting: Internal Medicine

## 2015-07-26 NOTE — Telephone Encounter (Signed)
Sherri left a message for me to contact the patient regarding genetic testing. I did speak with him and he is agreeable with me sending his information to [place] for evaluation.

## 2015-08-17 DIAGNOSIS — Z940 Kidney transplant status: Secondary | ICD-10-CM | POA: Diagnosis not present

## 2015-09-08 DIAGNOSIS — R739 Hyperglycemia, unspecified: Secondary | ICD-10-CM | POA: Diagnosis not present

## 2015-09-08 DIAGNOSIS — Z940 Kidney transplant status: Secondary | ICD-10-CM | POA: Diagnosis not present

## 2015-09-08 DIAGNOSIS — M109 Gout, unspecified: Secondary | ICD-10-CM | POA: Diagnosis not present

## 2015-09-08 DIAGNOSIS — E785 Hyperlipidemia, unspecified: Secondary | ICD-10-CM | POA: Diagnosis not present

## 2015-09-08 DIAGNOSIS — N2581 Secondary hyperparathyroidism of renal origin: Secondary | ICD-10-CM | POA: Diagnosis not present

## 2015-09-12 DIAGNOSIS — I129 Hypertensive chronic kidney disease with stage 1 through stage 4 chronic kidney disease, or unspecified chronic kidney disease: Secondary | ICD-10-CM | POA: Diagnosis not present

## 2015-09-12 DIAGNOSIS — D751 Secondary polycythemia: Secondary | ICD-10-CM | POA: Diagnosis not present

## 2015-09-12 DIAGNOSIS — Z940 Kidney transplant status: Secondary | ICD-10-CM | POA: Diagnosis not present

## 2015-09-12 DIAGNOSIS — R739 Hyperglycemia, unspecified: Secondary | ICD-10-CM | POA: Diagnosis not present

## 2015-09-15 ENCOUNTER — Telehealth: Payer: Self-pay | Admitting: *Deleted

## 2015-09-15 NOTE — Telephone Encounter (Signed)
Spoke with patient regarding sleep study results.   I let him know that he does have sleep apnea and that Dr. Theodosia Blender suggestion is a CPAP titration with getting a CPAP machine for use in his home.   Patient denied this stating that he doesn't believe the results are accurate and "unless he changes his mind down the road"  He will not be doing any CPAP Titration or using a CPAP Machine.

## 2015-09-15 NOTE — Telephone Encounter (Signed)
Will forward to Dr. Klein to review. 

## 2015-10-04 DIAGNOSIS — H6692 Otitis media, unspecified, left ear: Secondary | ICD-10-CM | POA: Diagnosis not present

## 2015-10-04 DIAGNOSIS — H6092 Unspecified otitis externa, left ear: Secondary | ICD-10-CM | POA: Diagnosis not present

## 2015-10-05 ENCOUNTER — Other Ambulatory Visit: Payer: Self-pay | Admitting: Internal Medicine

## 2015-10-16 NOTE — Telephone Encounter (Signed)
Call received back from Dr. Broadus John at [facility] regarding the patient's genetic testing. She reports that he has been in contact with the patient and that his Medicare is his primary insurance and that the patient does not wish to pursue genetic testing at this time due to cost.

## 2015-11-13 DIAGNOSIS — Z940 Kidney transplant status: Secondary | ICD-10-CM | POA: Diagnosis not present

## 2015-12-05 MED FILL — LABETALOL HCL 200 MG TABLET: 200 | 30 days supply | Qty: 60 | Fill #2

## 2015-12-05 MED FILL — ALLOPURINOL 100 MG TABLET: 100 | 30 days supply | Qty: 60 | Fill #2

## 2015-12-05 MED FILL — cloNIDine HCL 0.2 MG TABS: 0.2 | 30 days supply | Qty: 30 | Fill #2

## 2015-12-05 MED FILL — predniSONE 5 MG TABS: 5 | 30 days supply | Qty: 30 | Fill #3

## 2015-12-05 MED FILL — SULFAMETHOXAZOLE/TMP SS TAB: 400-80 | 28 days supply | Qty: 12 | Fill #2

## 2015-12-12 MED FILL — OMEPRAZOLE DR 20 MG CAPSULE: 20 | 30 days supply | Qty: 30 | Fill #3

## 2015-12-12 MED FILL — MYCOPHENOLIC ACID DR 180 MG: 180 | 30 days supply | Qty: 240 | Fill #3

## 2015-12-12 MED FILL — FENOFIBRATE 134 MG CAPSULE: 134 | 30 days supply | Qty: 30 | Fill #4

## 2015-12-12 MED FILL — TACROLIMUS 1 MG CAPSULE: 1 | 30 days supply | Qty: 60 | Fill #1

## 2015-12-12 MED FILL — CIALIS 5 MG TABLET: 5 | 30 days supply | Qty: 30 | Fill #11

## 2015-12-15 DIAGNOSIS — M109 Gout, unspecified: Secondary | ICD-10-CM | POA: Diagnosis not present

## 2015-12-15 DIAGNOSIS — Z940 Kidney transplant status: Secondary | ICD-10-CM | POA: Diagnosis not present

## 2015-12-15 DIAGNOSIS — R739 Hyperglycemia, unspecified: Secondary | ICD-10-CM | POA: Diagnosis not present

## 2015-12-19 DIAGNOSIS — I421 Obstructive hypertrophic cardiomyopathy: Secondary | ICD-10-CM | POA: Diagnosis not present

## 2015-12-19 DIAGNOSIS — D751 Secondary polycythemia: Secondary | ICD-10-CM | POA: Diagnosis not present

## 2015-12-19 DIAGNOSIS — I770 Arteriovenous fistula, acquired: Secondary | ICD-10-CM | POA: Diagnosis not present

## 2015-12-19 DIAGNOSIS — E785 Hyperlipidemia, unspecified: Secondary | ICD-10-CM | POA: Diagnosis not present

## 2015-12-19 DIAGNOSIS — R739 Hyperglycemia, unspecified: Secondary | ICD-10-CM | POA: Diagnosis not present

## 2015-12-19 DIAGNOSIS — I2510 Atherosclerotic heart disease of native coronary artery without angina pectoris: Secondary | ICD-10-CM | POA: Diagnosis not present

## 2015-12-19 DIAGNOSIS — N2581 Secondary hyperparathyroidism of renal origin: Secondary | ICD-10-CM | POA: Diagnosis not present

## 2015-12-19 DIAGNOSIS — I129 Hypertensive chronic kidney disease with stage 1 through stage 4 chronic kidney disease, or unspecified chronic kidney disease: Secondary | ICD-10-CM | POA: Diagnosis not present

## 2015-12-19 DIAGNOSIS — Z940 Kidney transplant status: Secondary | ICD-10-CM | POA: Diagnosis not present

## 2015-12-19 MED FILL — AZITHROMYCIN 250 MG TABLET: 250 | 5 days supply | Qty: 6 | Fill #0

## 2015-12-21 NOTE — Progress Notes (Signed)
Patient ID: [name], male   DOB: [birth year], 55 y.o.   MRN: [id number]   55 y.o. f/u CAD and Flutter. Post renal transplant. Had CABG on 4/13 Subsequent fluter ablation by Dr Caryl Comes. EF normal by echo 11/13 60-65% Nonischemic post CABG myovue  12/13. 03/23/13 had renal transplant from son to right pelvis Baseline Cr 1.4 to 1.6 Still on Prograf and myfortic with tapering prednisone Had a lot of side effects from steroids with weight gain and irritability. No cardiac issues. Mild dependant edema. No palpitations or chest pain BP low but does not want to lower nightly clonidine  Had frequent PAC;s in office that were asymptomatic   Echo 08/26/2104  still with severe LVH and LAE.  Mild MR EF normal.  Discussed risk of recurrent afib.  He has occasional bad days of irregular beats ? 2x/58months  Encouraged him to get ECG at hospital or our office if recurs   He has had multiple bouts of rejection and has to stay on prednisone  He is cushingoid and gained a lot of weight.  Last long episode of palpitations was at son's wedding In January. Also has ambient PVCls so pulse is usually irregular.    Seen by Dr Caryl Comes  Not interested in genetic HOCM testing and he is adopted.  Positive sleep study not interested in CPAP Uses Alive Cor and no PAF noted    ROS: Denies fever, malais, weight loss, blurry vision, decreased visual acuity, cough, sputum, SOB, hemoptysis, pleuritic pain, palpitaitons, heartburn, abdominal pain, melena, lower extremity edema, claudication, or rash.  All other systems reviewed and negative  General: Affect appropriate Cushingoid  Overweight white male  HEENT: normal Neck supple with no adenopathy JVP normal  Right  bruits no thyromegaly Lungs clear with no wheezing and good diaphragmatic motion Heart:  S1/S2 SEM  murmur, no rub, gallop or click PMI normal Abdomen: benighn, BS positve, no tenderness, no AAA transplant in right pelvic fossa no bruit.  No HSM or HJR Distal pulses  intact with fistula LEU wrist two aneurysmal segments  Plus one bilateral edema Neuro non-focal Skin warm and dry No muscular weakness   Current Outpatient Prescriptions  Medication Sig Dispense Refill  . allopurinol (ZYLOPRIM) 100 MG tablet Take 200 mg by mouth daily.     Marland Kitchen aspirin 81 MG EC tablet Take 1 tablet (81 mg total) by mouth daily. 30 tablet   . CIALIS 5 MG tablet Take 5 mg by mouth daily as needed. For ED  11  . cloNIDine (CATAPRES) 0.2 MG tablet Take 0.2 mg by mouth daily.     . fenofibrate micronized (LOFIBRA) 134 MG capsule Take 134 mg by mouth daily before breakfast.    . labetalol (NORMODYNE) 200 MG tablet TAKE 1 TABLET BY MOUTH TWICE DAILY 60 tablet 8  . Magnesium 400 MG CAPS Take 1 capsule by mouth daily.     . mycophenolate (MYFORTIC) 180 MG EC tablet Take 4 tablets by mouth by in the a.m and 4 tablets by mouth in the pm    . nitroGLYCERIN (NITROSTAT) 0.4 MG SL tablet Place 0.4 mg under the tongue every 5 (five) minutes x 3 doses as needed. For chest pain    . omega-3 acid ethyl esters (LOVAZA) 1 G capsule Take 2 g by mouth 2 (two) times daily.    Marland Kitchen omeprazole (PRILOSEC) 20 MG capsule Take 20 mg by mouth daily.  5  . prednisoLONE 5 MG TABS tablet Take 5 mg by  mouth at bedtime.     . simvastatin (ZOCOR) 40 MG tablet Take 40 mg by mouth at bedtime.     . sulfamethoxazole-trimethoprim (BACTRIM DS) 800-160 MG per tablet Take 1 tablet by mouth 3 (three) times a week. Take 1 tab by mouth 3 times a week on Mon, Wed and Fri.    . tacrolimus (PROGRAF) 1 MG capsule Take 1 mg by mouth 2 (two) times daily.  5   No current facility-administered medications for this visit.    Allergies  Contrast media and Penicillins  Electrocardiogram:  02/16/14  SR PAC;s LAD LVH  02/22/15  SR rate 10  LAD LVH  PAC;s    Assessment and Plan CAD/CABG:  2013 no angina consider tying fistula of to decrease CO ans stress on heart Flutter/Palpitations:  NSR no palpitations better with weight loss  has Alive Cor on phone HOCM:  LVH life long HTN  No interest in genetic testing can't have MRI with gadolinium due to CRF / transplant Renal Transplant still dependant on prednisone continue prograf f/u Dr Lorrene Reid Erythrocytosis:  Chronic Hb 16.9 defer to renal need for phlebotomy.  Has not had one in over a year but feels horrible With CNS symptoms when he has them  Only smoking a cigar on weekends previous smoker  Bruit:  Right f/u carotid duplex  Chol:  HDL only 24 LDL fine refer to lipid clinic but not clear there is an easy way to increase other than exercise   Jenkins Rouge

## 2015-12-22 ENCOUNTER — Encounter: Payer: Self-pay | Admitting: Cardiovascular Disease

## 2015-12-22 ENCOUNTER — Ambulatory Visit (INDEPENDENT_AMBULATORY_CARE_PROVIDER_SITE_OTHER): Payer: Medicare Other | Admitting: Cardiovascular Disease

## 2015-12-22 VITALS — BP 126/78 | HR 54 | Ht 68.0 in | Wt 205.8 lb

## 2015-12-22 DIAGNOSIS — I4892 Unspecified atrial flutter: Secondary | ICD-10-CM | POA: Diagnosis not present

## 2015-12-22 DIAGNOSIS — R0989 Other specified symptoms and signs involving the circulatory and respiratory systems: Secondary | ICD-10-CM | POA: Diagnosis not present

## 2015-12-22 DIAGNOSIS — I25700 Atherosclerosis of coronary artery bypass graft(s), unspecified, with unstable angina pectoris: Secondary | ICD-10-CM

## 2015-12-22 NOTE — Patient Instructions (Addendum)
Medication Instructions:  Your physician recommends that you continue on your current medications as directed. Please refer to the Current Medication list given to you today.  Labwork: NONE  Testing/Procedures: Your physician has requested that you have a carotid duplex. This test is an ultrasound of the carotid arteries in your neck. It looks at blood flow through these arteries that supply the brain with blood. Allow one hour for this exam. There are no restrictions or special instructions.  Follow-Up: Your physician recommends that you schedule a follow-up appointment with Lipid Clinic for low HDL  Your physician wants you to follow-up in: 6 months with Dr. Johnsie Cancel. You will receive a reminder letter in the mail two months in advance. If you don't receive a letter, please call our office to schedule the follow-up appointment.   If you need a refill on your cardiac medications before your next appointment, please call your pharmacy.

## 2015-12-27 ENCOUNTER — Inpatient Hospital Stay (HOSPITAL_COMMUNITY): Admission: RE | Admit: 2015-12-27 | Payer: Medicare Other | Source: Ambulatory Visit

## 2015-12-28 ENCOUNTER — Ambulatory Visit (HOSPITAL_COMMUNITY)
Admission: RE | Admit: 2015-12-28 | Discharge: 2015-12-28 | Disposition: A | Discharge status: Home / Self Care | Payer: Medicare Other | Source: Ambulatory Visit | Attending: Internal Medicine | Admitting: Internal Medicine

## 2015-12-28 DIAGNOSIS — E7800 Pure hypercholesterolemia, unspecified: Secondary | ICD-10-CM | POA: Diagnosis not present

## 2015-12-28 DIAGNOSIS — N185 Chronic kidney disease, stage 5: Secondary | ICD-10-CM | POA: Insufficient documentation

## 2015-12-28 DIAGNOSIS — R0989 Other specified symptoms and signs involving the circulatory and respiratory systems: Secondary | ICD-10-CM

## 2015-12-28 DIAGNOSIS — I120 Hypertensive chronic kidney disease with stage 5 chronic kidney disease or end stage renal disease: Secondary | ICD-10-CM | POA: Insufficient documentation

## 2015-12-28 DIAGNOSIS — I6523 Occlusion and stenosis of bilateral carotid arteries: Secondary | ICD-10-CM | POA: Diagnosis not present

## 2015-12-29 ENCOUNTER — Telehealth: Payer: Self-pay | Admitting: Pharmacist

## 2015-12-29 ENCOUNTER — Ambulatory Visit: Payer: Medicare Other | Admitting: Pharmacist

## 2015-12-29 NOTE — Telephone Encounter (Signed)
Pt was scheduled with the [facility] today for low HDL.  No labs available for review.  Contacted Dr. Eddie Dibbles office as well as [facility].  Received labs and notes from both offices but neither contained the lipid panel.  Both nephrologist note and Dr. Kyla Balzarine note referenced only concern was low HDL.  Discussed this with the patient.  In patients with low HDL the only medication option to increase HDL is Niaspan and there is no data to support use in patients already at LDL goal.  Best option is exercise.  I did not see a need for patient to come to the office today given there are no medication recommendations we can make at this time.  He is agreeable to this plan.  Appt has been canceled.

## 2016-01-02 DIAGNOSIS — N5201 Erectile dysfunction due to arterial insufficiency: Secondary | ICD-10-CM | POA: Diagnosis not present

## 2016-01-02 DIAGNOSIS — Z0000 Encounter for general adult medical examination without abnormal findings: Secondary | ICD-10-CM | POA: Diagnosis not present

## 2016-01-02 MED FILL — ALLOPURINOL 100 MG TABLET: 100 | 30 days supply | Qty: 60 | Fill #3

## 2016-01-02 MED FILL — predniSONE 5 MG TABS: 5 | 30 days supply | Qty: 30 | Fill #4

## 2016-01-02 MED FILL — SULFAMETHOXAZOLE/TMP SS TAB: 400-80 | 28 days supply | Qty: 12 | Fill #3

## 2016-01-02 MED FILL — cloNIDine HCL 0.2 MG TABS: 0.2 | 30 days supply | Qty: 30 | Fill #3

## 2016-01-02 MED FILL — CIALIS 5 MG TABLET: 5 | 30 days supply | Qty: 30 | Fill #0

## 2016-01-02 MED FILL — LABETALOL HCL 200 MG TABLET: 200 | 30 days supply | Qty: 60 | Fill #3

## 2016-01-09 MED FILL — OMEPRAZOLE DR 20 MG CAPSULE: 20 | 30 days supply | Qty: 30 | Fill #4

## 2016-01-09 MED FILL — FENOFIBRATE 134 MG CAPSULE: 134 | 30 days supply | Qty: 30 | Fill #5

## 2016-01-09 MED FILL — MYCOPHENOLIC ACID DR 180 MG: 180 | 30 days supply | Qty: 240 | Fill #4

## 2016-01-09 MED FILL — TACROLIMUS 1 MG CAPSULE: 1 | 30 days supply | Qty: 60 | Fill #2

## 2016-01-09 MED FILL — OMEGA-3 ETHYL ESTERS 1 GM C: 1 | 90 days supply | Qty: 360 | Fill #3

## 2016-01-16 DIAGNOSIS — Z940 Kidney transplant status: Secondary | ICD-10-CM | POA: Diagnosis not present

## 2016-01-30 MED FILL — ALLOPURINOL 100 MG TABLET: 100 | 30 days supply | Qty: 60 | Fill #4

## 2016-01-30 MED FILL — predniSONE 5 MG TABS: 5 | 30 days supply | Qty: 30 | Fill #5

## 2016-01-30 MED FILL — LABETALOL HCL 200 MG TABLET: 200 | 30 days supply | Qty: 60 | Fill #4

## 2016-01-30 MED FILL — CIALIS 5 MG TABLET: 5 | 30 days supply | Qty: 30 | Fill #1

## 2016-01-30 MED FILL — SULFAMETHOXAZOLE/TMP SS TAB: 400-80 | 28 days supply | Qty: 12 | Fill #4

## 2016-01-30 MED FILL — cloNIDine HCL 0.2 MG TABS: 0.2 | 30 days supply | Qty: 30 | Fill #4

## 2016-01-31 DIAGNOSIS — B351 Tinea unguium: Secondary | ICD-10-CM | POA: Diagnosis not present

## 2016-01-31 DIAGNOSIS — L814 Other melanin hyperpigmentation: Secondary | ICD-10-CM | POA: Diagnosis not present

## 2016-01-31 DIAGNOSIS — D225 Melanocytic nevi of trunk: Secondary | ICD-10-CM | POA: Diagnosis not present

## 2016-01-31 DIAGNOSIS — L918 Other hypertrophic disorders of the skin: Secondary | ICD-10-CM | POA: Diagnosis not present

## 2016-01-31 DIAGNOSIS — Z86018 Personal history of other benign neoplasm: Secondary | ICD-10-CM | POA: Diagnosis not present

## 2016-01-31 DIAGNOSIS — L820 Inflamed seborrheic keratosis: Secondary | ICD-10-CM | POA: Diagnosis not present

## 2016-01-31 DIAGNOSIS — D1800 Hemangioma unspecified site: Secondary | ICD-10-CM | POA: Diagnosis not present

## 2016-01-31 DIAGNOSIS — L821 Other seborrheic keratosis: Secondary | ICD-10-CM | POA: Diagnosis not present

## 2016-02-01 DIAGNOSIS — I770 Arteriovenous fistula, acquired: Secondary | ICD-10-CM | POA: Diagnosis not present

## 2016-02-01 DIAGNOSIS — D751 Secondary polycythemia: Secondary | ICD-10-CM | POA: Diagnosis not present

## 2016-02-01 DIAGNOSIS — I2510 Atherosclerotic heart disease of native coronary artery without angina pectoris: Secondary | ICD-10-CM | POA: Diagnosis not present

## 2016-02-01 DIAGNOSIS — I421 Obstructive hypertrophic cardiomyopathy: Secondary | ICD-10-CM | POA: Diagnosis not present

## 2016-02-01 DIAGNOSIS — E785 Hyperlipidemia, unspecified: Secondary | ICD-10-CM | POA: Diagnosis not present

## 2016-02-01 DIAGNOSIS — Z940 Kidney transplant status: Secondary | ICD-10-CM | POA: Diagnosis not present

## 2016-02-01 DIAGNOSIS — R739 Hyperglycemia, unspecified: Secondary | ICD-10-CM | POA: Diagnosis not present

## 2016-02-01 DIAGNOSIS — I129 Hypertensive chronic kidney disease with stage 1 through stage 4 chronic kidney disease, or unspecified chronic kidney disease: Secondary | ICD-10-CM | POA: Diagnosis not present

## 2016-02-01 MED FILL — TACROLIMUS 1 MG CAPSULE: 1 | 30 days supply | Qty: 60 | Fill #3

## 2016-02-01 MED FILL — OMEPRAZOLE DR 20 MG CAPSULE: 20 | 30 days supply | Qty: 30 | Fill #5

## 2016-02-12 MED FILL — MYCOPHENOLIC ACID DR 180 MG: 180 | 30 days supply | Qty: 240 | Fill #5

## 2016-03-05 MED FILL — SULFAMETHOXAZOLE/TMP SS TAB: 400-80 | 28 days supply | Qty: 12 | Fill #5

## 2016-03-05 MED FILL — SIMVASTATIN 40 MG TABLET: 40 | 30 days supply | Qty: 30 | Fill #0

## 2016-03-05 MED FILL — TACROLIMUS 1 MG CAPSULE: 1 | 30 days supply | Qty: 60 | Fill #4

## 2016-03-05 MED FILL — CIALIS 5 MG TABLET: 5 | 30 days supply | Qty: 30 | Fill #2

## 2016-03-05 MED FILL — FENOFIBRATE 134 MG CAPSULE: 134 | 30 days supply | Qty: 30 | Fill #0

## 2016-03-05 MED FILL — LABETALOL HCL 200 MG TABLET: 200 | 30 days supply | Qty: 60 | Fill #5

## 2016-03-07 MED FILL — MYCOPHENOLIC ACID DR 180 MG: 180 | 30 days supply | Qty: 240 | Fill #0

## 2016-03-12 MED FILL — ALLOPURINOL 100 MG TABLET: 100 | 30 days supply | Qty: 60 | Fill #5

## 2016-03-12 MED FILL — cloNIDine HCL 0.2 MG TABS: 0.2 | 30 days supply | Qty: 30 | Fill #5

## 2016-03-12 MED FILL — OMEPRAZOLE DR 20 MG CAPSULE: 20 | 30 days supply | Qty: 30 | Fill #0

## 2016-03-12 MED FILL — predniSONE 5 MG TABS: 5 | 30 days supply | Qty: 30 | Fill #6

## 2016-04-02 MED FILL — TACROLIMUS 1 MG CAPSULE: 1 | 30 days supply | Qty: 60 | Fill #5

## 2016-04-02 MED FILL — SIMVASTATIN 40 MG TABLET: 40 | 30 days supply | Qty: 30 | Fill #1

## 2016-04-02 MED FILL — CIALIS 5 MG TABLET: 5 | 30 days supply | Qty: 30 | Fill #3

## 2016-04-02 MED FILL — MYCOPHENOLIC ACID DR 180 MG: 180 | 30 days supply | Qty: 240 | Fill #1

## 2016-04-02 MED FILL — FENOFIBRATE 134 MG CAPSULE: 134 | 30 days supply | Qty: 30 | Fill #1

## 2016-04-02 MED FILL — LABETALOL HCL 200 MG TABLET: 200 | 30 days supply | Qty: 60 | Fill #6

## 2016-04-02 MED FILL — OMEGA-3 ETHYL ESTERS 1 GM C: 1 | 90 days supply | Qty: 360 | Fill #0

## 2016-04-02 MED FILL — SULFAMETHOXAZOLE/TMP SS TAB: 400-80 | 28 days supply | Qty: 12 | Fill #6

## 2016-04-09 MED FILL — predniSONE 5 MG TABS: 5 | 30 days supply | Qty: 30 | Fill #7

## 2016-04-09 MED FILL — ALLOPURINOL 100 MG TABLET: 100 | 30 days supply | Qty: 60 | Fill #0

## 2016-04-09 MED FILL — OMEPRAZOLE DR 20 MG CAPSULE: 20 | 30 days supply | Qty: 30 | Fill #1

## 2016-04-12 MED FILL — cloNIDine HCL 0.2 MG TABS: 0.2 | 30 days supply | Qty: 30 | Fill #0

## 2016-04-30 MED FILL — FENOFIBRATE 134 MG CAPSULE: 134 | 30 days supply | Qty: 30 | Fill #2

## 2016-04-30 MED FILL — SIMVASTATIN 40 MG TABLET: 40 | 30 days supply | Qty: 30 | Fill #2

## 2016-04-30 MED FILL — LABETALOL HCL 200 MG TABLET: 200 | 30 days supply | Qty: 60 | Fill #7

## 2016-04-30 MED FILL — CIALIS 5 MG TABLET: 5 | 30 days supply | Qty: 30 | Fill #4

## 2016-04-30 MED FILL — TACROLIMUS 1 MG CAPSULE: 1 | 30 days supply | Qty: 60 | Fill #0

## 2016-04-30 MED FILL — SULFAMETHOXAZOLE/TMP SS TAB: 400-80 | 28 days supply | Qty: 12 | Fill #0

## 2016-04-30 MED FILL — MYCOPHENOLIC ACID DR 180 MG: 180 | 30 days supply | Qty: 240 | Fill #2

## 2016-05-07 MED FILL — predniSONE 5 MG TABS: 5 | 30 days supply | Qty: 30 | Fill #8

## 2016-05-07 MED FILL — ALLOPURINOL 100 MG TABLET: 100 | 30 days supply | Qty: 60 | Fill #1

## 2016-05-07 MED FILL — cloNIDine HCL 0.2 MG TABS: 0.2 | 30 days supply | Qty: 30 | Fill #1

## 2016-05-07 MED FILL — OMEPRAZOLE DR 20 MG CAPSULE: 20 | 30 days supply | Qty: 30 | Fill #2

## 2016-05-09 DIAGNOSIS — M109 Gout, unspecified: Secondary | ICD-10-CM | POA: Diagnosis not present

## 2016-05-09 DIAGNOSIS — I129 Hypertensive chronic kidney disease with stage 1 through stage 4 chronic kidney disease, or unspecified chronic kidney disease: Secondary | ICD-10-CM | POA: Diagnosis not present

## 2016-05-09 DIAGNOSIS — Z940 Kidney transplant status: Secondary | ICD-10-CM | POA: Diagnosis not present

## 2016-05-09 DIAGNOSIS — E785 Hyperlipidemia, unspecified: Secondary | ICD-10-CM | POA: Diagnosis not present

## 2016-05-09 DIAGNOSIS — R739 Hyperglycemia, unspecified: Secondary | ICD-10-CM | POA: Diagnosis not present

## 2016-05-15 DIAGNOSIS — R739 Hyperglycemia, unspecified: Secondary | ICD-10-CM | POA: Diagnosis not present

## 2016-05-15 DIAGNOSIS — I421 Obstructive hypertrophic cardiomyopathy: Secondary | ICD-10-CM | POA: Diagnosis not present

## 2016-05-15 DIAGNOSIS — R300 Dysuria: Secondary | ICD-10-CM | POA: Diagnosis not present

## 2016-05-15 DIAGNOSIS — I770 Arteriovenous fistula, acquired: Secondary | ICD-10-CM | POA: Diagnosis not present

## 2016-05-15 DIAGNOSIS — D751 Secondary polycythemia: Secondary | ICD-10-CM | POA: Diagnosis not present

## 2016-05-15 DIAGNOSIS — Z940 Kidney transplant status: Secondary | ICD-10-CM | POA: Diagnosis not present

## 2016-05-15 DIAGNOSIS — I2510 Atherosclerotic heart disease of native coronary artery without angina pectoris: Secondary | ICD-10-CM | POA: Diagnosis not present

## 2016-05-15 DIAGNOSIS — E669 Obesity, unspecified: Secondary | ICD-10-CM | POA: Diagnosis not present

## 2016-05-15 DIAGNOSIS — I129 Hypertensive chronic kidney disease with stage 1 through stage 4 chronic kidney disease, or unspecified chronic kidney disease: Secondary | ICD-10-CM | POA: Diagnosis not present

## 2016-05-15 DIAGNOSIS — E785 Hyperlipidemia, unspecified: Secondary | ICD-10-CM | POA: Diagnosis not present

## 2016-05-17 MED FILL — VIT D2 1.25 MG (50,000 UNIT: 1.25 MG | 28 days supply | Qty: 8 | Fill #0

## 2016-05-20 MED FILL — SIMVASTATIN 40 MG TABLET: 40 | 90 days supply | Qty: 90 | Fill #3

## 2016-05-28 DIAGNOSIS — R3915 Urgency of urination: Secondary | ICD-10-CM | POA: Diagnosis not present

## 2016-05-28 DIAGNOSIS — R102 Pelvic and perineal pain: Secondary | ICD-10-CM | POA: Diagnosis not present

## 2016-05-28 MED FILL — FENOFIBRATE 134 MG CAPSULE: 134 | 30 days supply | Qty: 30 | Fill #3

## 2016-05-28 MED FILL — TACROLIMUS 1 MG CAPSULE: 1 | 30 days supply | Qty: 60 | Fill #1

## 2016-05-28 MED FILL — LABETALOL HCL 200 MG TABLET: 200 | 30 days supply | Qty: 60 | Fill #8

## 2016-05-28 MED FILL — CIALIS 5 MG TABLET: 5 | 30 days supply | Qty: 30 | Fill #5

## 2016-05-28 MED FILL — MYCOPHENOLIC ACID DR 180 MG: 180 | 30 days supply | Qty: 240 | Fill #3

## 2016-05-28 MED FILL — SULFAMETHOXAZOLE/TMP SS TAB: 400-80 | 28 days supply | Qty: 12 | Fill #1

## 2016-05-29 ENCOUNTER — Ambulatory Visit (HOSPITAL_COMMUNITY)
Admission: RE | Admit: 2016-05-29 | Discharge: 2016-05-29 | Disposition: A | Discharge status: Home / Self Care | Payer: 59 | Source: Ambulatory Visit | Attending: Adult Health | Admitting: Adult Health

## 2016-05-29 ENCOUNTER — Other Ambulatory Visit (HOSPITAL_COMMUNITY): Payer: Self-pay | Admitting: Adult Health

## 2016-05-29 DIAGNOSIS — R1031 Right lower quadrant pain: Secondary | ICD-10-CM | POA: Diagnosis not present

## 2016-05-29 DIAGNOSIS — N210 Calculus in bladder: Secondary | ICD-10-CM | POA: Diagnosis not present

## 2016-05-29 DIAGNOSIS — R102 Pelvic and perineal pain: Secondary | ICD-10-CM | POA: Diagnosis not present

## 2016-05-29 DIAGNOSIS — Z940 Kidney transplant status: Secondary | ICD-10-CM | POA: Insufficient documentation

## 2016-06-10 MED FILL — predniSONE 5 MG TABS: 5 | 30 days supply | Qty: 30 | Fill #9

## 2016-06-10 MED FILL — OMEPRAZOLE DR 20 MG CAPSULE: 20 | 30 days supply | Qty: 30 | Fill #3

## 2016-06-10 MED FILL — cloNIDine HCL 0.2 MG TABS: 0.2 | 30 days supply | Qty: 30 | Fill #2

## 2016-06-10 MED FILL — ALLOPURINOL 100 MG TABLET: 100 | 30 days supply | Qty: 60 | Fill #2

## 2016-06-11 MED FILL — VIT D2 1.25 MG (50,000 UNIT: 1.25 MG | 28 days supply | Qty: 8 | Fill #1

## 2016-06-24 MED FILL — MYCOPHENOLIC ACID DR 180 MG: 180 | 30 days supply | Qty: 240 | Fill #4

## 2016-06-24 MED FILL — SULFAMETHOXAZOLE/TMP SS TAB: 400-80 | 28 days supply | Qty: 12 | Fill #2

## 2016-06-24 MED FILL — FENOFIBRATE 134 MG CAPSULE: 134 | 30 days supply | Qty: 30 | Fill #4

## 2016-06-24 MED FILL — TACROLIMUS 1 MG CAPSULE: 1 | 30 days supply | Qty: 60 | Fill #2

## 2016-06-24 MED FILL — CIALIS 5 MG TABLET: 5 | 30 days supply | Qty: 30 | Fill #6

## 2016-07-01 DIAGNOSIS — N210 Calculus in bladder: Secondary | ICD-10-CM | POA: Diagnosis not present

## 2016-07-03 ENCOUNTER — Other Ambulatory Visit: Payer: Self-pay | Admitting: Urology

## 2016-07-08 MED FILL — predniSONE 5 MG TABS: 5 | 30 days supply | Qty: 30 | Fill #10

## 2016-07-08 MED FILL — cloNIDine HCL 0.2 MG TABS: 0.2 | 30 days supply | Qty: 30 | Fill #3

## 2016-07-08 MED FILL — VIT D2 1.25 MG (50,000 UNIT: 1.25 MG | 28 days supply | Qty: 8 | Fill #2

## 2016-07-08 MED FILL — OMEPRAZOLE DR 20 MG CAPSULE: 20 | 30 days supply | Qty: 30 | Fill #4

## 2016-07-08 MED FILL — OMEGA-3 ETHYL ESTERS 1 GM C: 1 | 90 days supply | Qty: 360 | Fill #1

## 2016-07-08 MED FILL — ALLOPURINOL 100 MG TABLET: 100 | 30 days supply | Qty: 60 | Fill #3

## 2016-07-10 ENCOUNTER — Other Ambulatory Visit: Payer: Self-pay

## 2016-07-10 ENCOUNTER — Encounter: Payer: Self-pay | Admitting: Cardiovascular Disease

## 2016-07-11 ENCOUNTER — Other Ambulatory Visit: Payer: Self-pay | Admitting: *Deleted

## 2016-07-11 MED ORDER — LABETALOL HCL 200 MG PO TABS: 200.0000 mg | ORAL_TABLET | Freq: Two times a day (BID) | ORAL | 1 refills | Status: DC

## 2016-07-11 MED FILL — LABETALOL HCL 200 MG TABLET: 200 | 90 days supply | Qty: 180 | Fill #0

## 2016-07-11 NOTE — Telephone Encounter (Signed)
I'm not sure why this is not on his list anymore. Please call to confirm he is still taking this, if so, then ok to refill.

## 2016-07-21 NOTE — Progress Notes (Addendum)
Patient ID: [name], male   DOB: 09-Nov-1961, 55 y.o.   MRN: [id number]   55 y.o. f/u CAD and Flutter. Post renal transplant. Had CABG on 4/13 Subsequent fluter ablation by Dr [name]. EF normal by echo 11/13 60-65% Nonischemic post CABG myovue  12/13. 03/23/13 had renal transplant from son to right pelvis Baseline Cr 1.4 to 1.6 Still on Prograf and myfortic with tapering prednisone Had a lot of side effects from steroids with weight gain and irritability. No cardiac issues. Mild dependant edema. No palpitations or chest pain BP low but does not want to lower nightly clonidine  Had frequent PAC;s in office that were asymptomatic   Echo 08/26/2104  still with severe LVH and LAE.  Mild MR EF normal.  Discussed risk of recurrent afib.  He has occasional bad days of irregular beats ? 2x/85months  Encouraged him to get ECG at hospital or our office if recurs   He has had multiple bouts of rejection and has to stay on prednisone  He is cushingoid and gained a lot of weight.  Last long episode of palpitations was at son's wedding In January. Also has ambient PVCls so pulse is usually irregular.    Seen by Dr [name]  Not interested in genetic HOCM testing and he is adopted.  Positive sleep study not interested in CPAP Uses Alive Cor and no PAF noted   Still with some palpitations and lightheadedness when changing positions Lost 40 lbs last year and biking again  Saw Dr [name] and his LUE fistual is not very large and she did not think it Would benefit him to tie it off    ROS: Denies fever, malais, weight loss, blurry vision, decreased visual acuity, cough, sputum, SOB, hemoptysis, pleuritic pain, palpitaitons, heartburn, abdominal pain, melena, lower extremity edema, claudication, or rash.  All other systems reviewed and negative  General: Affect appropriate Cushingoid  Overweight white male  HEENT: normal Neck supple with no adenopathy JVP normal  Right  bruits no thyromegaly Lungs clear with no  wheezing and good diaphragmatic motion Heart:  S1/S2 SEM  murmur, no rub, gallop or click PMI normal Abdomen: benighn, BS positve, no tenderness, no AAA transplant in right pelvic fossa no bruit.  No HSM or HJR Distal pulses intact with fistula LEU wrist two aneurysmal segments  Plus one bilateral edema Neuro non-focal Skin warm and dry No muscular weakness   Current Outpatient Prescriptions  Medication Sig Dispense Refill  . allopurinol (ZYLOPRIM) 100 MG tablet Take 200 mg by mouth daily.     Marland Kitchen aspirin 81 MG EC tablet Take 1 tablet (81 mg total) by mouth daily. 30 tablet   . CIALIS 5 MG tablet Take 5 mg by mouth daily as needed. For ED  11  . cloNIDine (CATAPRES) 0.2 MG tablet Take 0.2 mg by mouth daily.     . fenofibrate micronized (LOFIBRA) 134 MG capsule Take 134 mg by mouth daily before breakfast.    . labetalol (NORMODYNE) 200 MG tablet Take 1 tablet (200 mg total) by mouth 2 (two) times daily. 180 tablet 1  . Magnesium 400 MG CAPS Take 1 capsule by mouth daily.     . mycophenolate (MYFORTIC) 180 MG EC tablet Take 4 tablets by mouth by in the a.m and 4 tablets by mouth in the pm    . nitroGLYCERIN (NITROSTAT) 0.4 MG SL tablet Place 0.4 mg under the tongue every 5 (five) minutes x 3 doses as needed. For chest pain    .  omega-3 acid ethyl esters (LOVAZA) 1 G capsule Take 2 g by mouth 2 (two) times daily.    Marland Kitchen omeprazole (PRILOSEC) 20 MG capsule Take 20 mg by mouth daily.  5  . prednisoLONE 5 MG TABS tablet Take 5 mg by mouth at bedtime.     . simvastatin (ZOCOR) 40 MG tablet Take 40 mg by mouth at bedtime.     . sulfamethoxazole-trimethoprim (BACTRIM DS) 800-160 MG per tablet Take 1 tablet by mouth 3 (three) times a week. Take 1 tab by mouth 3 times a week on Mon, Wed and Fri.    . tacrolimus (PROGRAF) 1 MG capsule Take 1 mg by mouth 2 (two) times daily.  5  . Vitamin D, Ergocalciferol, (DRISDOL) 50000 units CAPS capsule Take 50,000 Units by mouth once a week.  5   No current  facility-administered medications for this visit.     Allergies  Contrast media [iodinated diagnostic agents] and Penicillins  Electrocardiogram:  02/16/14  SR PAC;s LAD LVH  02/22/15  SR rate 66  LAD LVH  PAC;s   07/24/16  SR LVH LAD frequent PACls PVC;s couplets   Assessment and Plan CAD/CABG:  2013 no angina continue ASA and statin  Flutter/Palpitations:  NSR no palpitations better with weight loss has Alive Cor on phone Still Concerned at the frequency of PAC;s and PVCls he is having. ? Suppression will get 48 hr  holter and have him see Dr [name] again  HOCM:  LVH life long HTN  No interest in genetic testing can't have MRI with gadolinium due to CRF / transplant Renal Transplant still dependant on prednisone continue prograf f/u Dr [name] Erythrocytosis:  Chronic Hb 16.9 defer to renal need for phlebotomy indicates last one was 2 months ago With CNS symptoms when he has them  Only smoking a cigar on weekends previous smoker  Bruit:  Right f/u carotid duplex 12/28/15 plaque no stenosis repeat 12/2017 Chol:  HDL only 24 LDL fine  No indication for niaspan as LDL fine and best way to increase HDL is with exercise discussed with patient    Jenkins Rouge

## 2016-07-24 ENCOUNTER — Encounter: Payer: Self-pay | Admitting: Cardiovascular Disease

## 2016-07-24 ENCOUNTER — Ambulatory Visit (INDEPENDENT_AMBULATORY_CARE_PROVIDER_SITE_OTHER): Payer: 59 | Admitting: Cardiovascular Disease

## 2016-07-24 ENCOUNTER — Encounter (INDEPENDENT_AMBULATORY_CARE_PROVIDER_SITE_OTHER): Payer: Self-pay

## 2016-07-24 VITALS — BP 106/70 | HR 51 | Ht 68.0 in | Wt 195.8 lb

## 2016-07-24 DIAGNOSIS — I25700 Atherosclerosis of coronary artery bypass graft(s), unspecified, with unstable angina pectoris: Secondary | ICD-10-CM | POA: Diagnosis not present

## 2016-07-24 DIAGNOSIS — I493 Ventricular premature depolarization: Secondary | ICD-10-CM | POA: Diagnosis not present

## 2016-07-24 NOTE — Patient Instructions (Addendum)
Medication Instructions:  Your physician recommends that you continue on your current medications as directed. Please refer to the Current Medication list given to you today.  Labwork: NONE  Testing/Procedures: Your physician has recommended that you wear a 48 holter monitor. Holter monitors are medical devices that record the heart's electrical activity. Doctors most often use these monitors to diagnose arrhythmias. Arrhythmias are problems with the speed or rhythm of the heartbeat. The monitor is a small, portable device. You can wear one while you do your normal daily activities. This is usually used to diagnose what is causing palpitations/syncope (passing out).  Follow-Up: Your physician wants you to follow-up in: 6 months with Dr. Johnsie Cancel. You will receive a reminder letter in the mail two months in advance. If you don't receive a letter, please call our office to schedule the follow-up appointment.  Your physician recommends that you schedule a follow-up appointment next available with Dr. Caryl Comes.     If you need a refill on your cardiac medications before your next appointment, please call your pharmacy.

## 2016-07-26 MED FILL — MYCOPHENOLIC ACID DR 180 MG: 180 | 30 days supply | Qty: 240 | Fill #5

## 2016-07-26 MED FILL — TACROLIMUS 1 MG CAPSULE: 1 | 30 days supply | Qty: 60 | Fill #3

## 2016-07-26 MED FILL — FENOFIBRATE 134 MG CAPSULE: 134 | 30 days supply | Qty: 30 | Fill #5

## 2016-07-26 MED FILL — SULFAMETHOXAZOLE/TMP SS TAB: 400-80 | 28 days supply | Qty: 12 | Fill #3

## 2016-07-26 MED FILL — CIALIS 5 MG TABLET: 5 | 30 days supply | Qty: 30 | Fill #7

## 2016-07-30 ENCOUNTER — Ambulatory Visit (INDEPENDENT_AMBULATORY_CARE_PROVIDER_SITE_OTHER): Payer: 59

## 2016-07-30 DIAGNOSIS — I493 Ventricular premature depolarization: Secondary | ICD-10-CM

## 2016-07-30 DIAGNOSIS — I25700 Atherosclerosis of coronary artery bypass graft(s), unspecified, with unstable angina pectoris: Secondary | ICD-10-CM | POA: Diagnosis not present

## 2016-08-06 DIAGNOSIS — D1800 Hemangioma unspecified site: Secondary | ICD-10-CM | POA: Diagnosis not present

## 2016-08-06 DIAGNOSIS — L720 Epidermal cyst: Secondary | ICD-10-CM | POA: Diagnosis not present

## 2016-08-06 DIAGNOSIS — L820 Inflamed seborrheic keratosis: Secondary | ICD-10-CM | POA: Diagnosis not present

## 2016-08-06 DIAGNOSIS — L821 Other seborrheic keratosis: Secondary | ICD-10-CM | POA: Diagnosis not present

## 2016-08-06 DIAGNOSIS — D225 Melanocytic nevi of trunk: Secondary | ICD-10-CM | POA: Diagnosis not present

## 2016-08-06 DIAGNOSIS — Z86018 Personal history of other benign neoplasm: Secondary | ICD-10-CM | POA: Diagnosis not present

## 2016-08-06 DIAGNOSIS — L814 Other melanin hyperpigmentation: Secondary | ICD-10-CM | POA: Diagnosis not present

## 2016-08-08 NOTE — Patient Instructions (Addendum)
Hatcher Frith Kimmey  08/08/2016   Your procedure is scheduled on: 08-15-16  Report to [facility]  Entrance take [facility]  elevators to 3rd floor to  [place] at 530 AM.  Call this number if you have problems the morning of surgery [phone]   Remember: ONLY 1 PERSON MAY GO WITH YOU TO SHORT STAY TO GET  READY MORNING OF [place].  Do not eat food or drink liquids :After Midnight.     Take these medicines the morning of surgery with A SIP OF WATER: ALLOPURINOL, CIALIS, LABETALOL, MYFORTIC, OMEPRAZOLE (PRILOSEC),  PROGRAF                               You may not have any metal on your body including hair pins and              piercings  Do not wear jewelry, make-up, lotions, powders or perfumes, deodorant             Do not wear nail polish.  Do not shave  48 hours prior to surgery.              Men may shave face and neck.   Do not bring valuables to the hospital. [place].  Contacts, dentures or bridgework may not be worn into surgery.  Leave suitcase in the car. After surgery it may be brought to your room.     Patients discharged the day of surgery will not be allowed to drive home.  Name and phone number of your driver:              Please read over the following fact sheets you were given: _____________________________________________________________________             [facility] - Preparing for Surgery Before surgery, you can play an important role.  Because skin is not sterile, your skin needs to be as free of germs as possible.  You can reduce the number of germs on your skin by washing with CHG (chlorahexidine gluconate) soap before surgery.  CHG is an antiseptic cleaner which kills germs and bonds with the skin to continue killing germs even after washing. Please DO NOT use if you have an allergy to CHG or antibacterial soaps.  If your skin becomes reddened/irritated stop using the CHG  and inform your nurse when you arrive at Short Stay. Do not shave (including legs and underarms) for at least 48 hours prior to the first CHG shower.  You may shave your face/neck. Please follow these instructions carefully:  1.  Shower with CHG Soap the night before surgery and the  morning of Surgery.  2.  If you choose to wash your hair, wash your hair first as usual with your  normal  shampoo.  3.  After you shampoo, rinse your hair and body thoroughly to remove the  shampoo.                           4.  Use CHG as you would any other liquid soap.  You can apply chg directly  to the skin and wash  Gently with a scrungie or clean washcloth.  5.  Apply the CHG Soap to your body ONLY FROM THE NECK DOWN.   Do not use on face/ open                           Wound or open sores. Avoid contact with eyes, ears mouth and genitals (private parts).                       Wash face,  Genitals (private parts) with your normal soap.             6.  Wash thoroughly, paying special attention to the area where your surgery  will be performed.  7.  Thoroughly rinse your body with warm water from the neck down.  8.  DO NOT shower/wash with your normal soap after using and rinsing off  the CHG Soap.                9.  Pat yourself dry with a clean towel.            10.  Wear clean pajamas.            11.  Place clean sheets on your bed the night of your first shower and do not  sleep with pets. Day of Surgery : Do not apply any lotions/deodorants the morning of surgery.  Please wear clean clothes to the hospital/surgery center.  FAILURE TO FOLLOW THESE INSTRUCTIONS MAY RESULT IN THE CANCELLATION OF YOUR SURGERY PATIENT SIGNATURE_________________________________  NURSE SIGNATURE__________________________________  ________________________________________________________________________

## 2016-08-09 ENCOUNTER — Encounter (HOSPITAL_COMMUNITY): Payer: Self-pay

## 2016-08-09 ENCOUNTER — Encounter (HOSPITAL_COMMUNITY)
Admission: RE | Admit: 2016-08-09 | Discharge: 2016-08-09 | Disposition: A | Discharge status: Home / Self Care | Payer: 59 | Source: Ambulatory Visit | Attending: Urology | Admitting: Urology

## 2016-08-09 DIAGNOSIS — Z01812 Encounter for preprocedural laboratory examination: Secondary | ICD-10-CM | POA: Insufficient documentation

## 2016-08-09 DIAGNOSIS — N210 Calculus in bladder: Secondary | ICD-10-CM | POA: Diagnosis not present

## 2016-08-09 LAB — BASIC METABOLIC PANEL
Anion gap: 8 (ref 5–15)
BUN: 23 mg/dL — AB (ref 6–20)
CALCIUM: 9.6 mg/dL (ref 8.9–10.3)
CO2: 20 mmol/L — ABNORMAL LOW (ref 22–32)
CREATININE: 1.29 mg/dL — AB (ref 0.61–1.24)
Chloride: 107 mmol/L (ref 101–111)
Glucose, Bld: 97 mg/dL (ref 65–99)
Potassium: 4.6 mmol/L (ref 3.5–5.1)
SODIUM: 135 mmol/L (ref 135–145)

## 2016-08-09 LAB — CBC
HCT: 48.1 % (ref 39.0–52.0)
Hemoglobin: 16.4 g/dL (ref 13.0–17.0)
MCH: 26.9 pg (ref 26.0–34.0)
MCHC: 34.1 g/dL (ref 30.0–36.0)
MCV: 79 fL (ref 78.0–100.0)
PLATELETS: 151 10*3/uL (ref 150–400)
RBC: 6.09 MIL/uL — ABNORMAL HIGH (ref 4.22–5.81)
RDW: 15.6 % — ABNORMAL HIGH (ref 11.5–15.5)
WBC: 7.2 10*3/uL (ref 4.0–10.5)

## 2016-08-09 MED FILL — cloNIDine HCL 0.2 MG TABS: 0.2 | 30 days supply | Qty: 30 | Fill #4

## 2016-08-09 MED FILL — OMEPRAZOLE DR 20 MG CAPSULE: 20 | 30 days supply | Qty: 30 | Fill #5

## 2016-08-09 MED FILL — ALLOPURINOL 100 MG TABLET: 100 | 30 days supply | Qty: 60 | Fill #4

## 2016-08-09 MED FILL — VIT D2 1.25 MG (50,000 UNIT: 1.25 MG | 28 days supply | Qty: 8 | Fill #3

## 2016-08-09 MED FILL — predniSONE 5 MG TABS: 5 | 30 days supply | Qty: 30 | Fill #11

## 2016-08-09 NOTE — Pre-Procedure Instructions (Addendum)
[facility], Dr. Johnsie Cancel 12-22-15 epic Holter Monitor 07-30-16 epic Echo 08-26-14 epic Vascular US Carotid Duplex 12-28-15 epic EKG 07-24-16 epic  Spoke with Dr. Kalman Shan regarding pt's cardiac history.  Dr. Kalman Shan did not state a need for cardiac clearance.  Pt will contact cardiologist for instructions on Aspirin use peri-op.

## 2016-08-13 DIAGNOSIS — M109 Gout, unspecified: Secondary | ICD-10-CM | POA: Diagnosis not present

## 2016-08-13 DIAGNOSIS — N2581 Secondary hyperparathyroidism of renal origin: Secondary | ICD-10-CM | POA: Diagnosis not present

## 2016-08-13 DIAGNOSIS — D751 Secondary polycythemia: Secondary | ICD-10-CM | POA: Diagnosis not present

## 2016-08-13 DIAGNOSIS — Z940 Kidney transplant status: Secondary | ICD-10-CM | POA: Diagnosis not present

## 2016-08-13 DIAGNOSIS — I129 Hypertensive chronic kidney disease with stage 1 through stage 4 chronic kidney disease, or unspecified chronic kidney disease: Secondary | ICD-10-CM | POA: Diagnosis not present

## 2016-08-13 DIAGNOSIS — Z7689 Persons encountering health services in other specified circumstances: Secondary | ICD-10-CM | POA: Diagnosis not present

## 2016-08-13 DIAGNOSIS — N210 Calculus in bladder: Secondary | ICD-10-CM | POA: Diagnosis not present

## 2016-08-13 DIAGNOSIS — I4892 Unspecified atrial flutter: Secondary | ICD-10-CM | POA: Diagnosis not present

## 2016-08-13 DIAGNOSIS — I421 Obstructive hypertrophic cardiomyopathy: Secondary | ICD-10-CM | POA: Diagnosis not present

## 2016-08-13 DIAGNOSIS — R739 Hyperglycemia, unspecified: Secondary | ICD-10-CM | POA: Diagnosis not present

## 2016-08-13 DIAGNOSIS — I4891 Unspecified atrial fibrillation: Secondary | ICD-10-CM | POA: Diagnosis not present

## 2016-08-13 DIAGNOSIS — I2510 Atherosclerotic heart disease of native coronary artery without angina pectoris: Secondary | ICD-10-CM | POA: Diagnosis not present

## 2016-08-15 ENCOUNTER — Ambulatory Visit (HOSPITAL_COMMUNITY): Payer: 59 | Admitting: Anesthesiology

## 2016-08-15 ENCOUNTER — Ambulatory Visit (HOSPITAL_COMMUNITY)
Admission: RE | Admit: 2016-08-15 | Discharge: 2016-08-15 | Disposition: A | Discharge status: Home / Self Care | Payer: 59 | Source: Ambulatory Visit | Attending: Urology | Admitting: Urology

## 2016-08-15 ENCOUNTER — Encounter (HOSPITAL_COMMUNITY)
Admission: RE | Disposition: A | Discharge status: Home / Self Care | Payer: Self-pay | Source: Ambulatory Visit | Attending: Urology

## 2016-08-15 ENCOUNTER — Encounter (HOSPITAL_COMMUNITY): Payer: Self-pay | Admitting: *Deleted

## 2016-08-15 DIAGNOSIS — Z940 Kidney transplant status: Secondary | ICD-10-CM | POA: Insufficient documentation

## 2016-08-15 DIAGNOSIS — I2510 Atherosclerotic heart disease of native coronary artery without angina pectoris: Secondary | ICD-10-CM | POA: Insufficient documentation

## 2016-08-15 DIAGNOSIS — Z79899 Other long term (current) drug therapy: Secondary | ICD-10-CM | POA: Insufficient documentation

## 2016-08-15 DIAGNOSIS — N189 Chronic kidney disease, unspecified: Secondary | ICD-10-CM | POA: Diagnosis not present

## 2016-08-15 DIAGNOSIS — I252 Old myocardial infarction: Secondary | ICD-10-CM | POA: Diagnosis not present

## 2016-08-15 DIAGNOSIS — Z7982 Long term (current) use of aspirin: Secondary | ICD-10-CM | POA: Diagnosis not present

## 2016-08-15 DIAGNOSIS — Z8673 Personal history of transient ischemic attack (TIA), and cerebral infarction without residual deficits: Secondary | ICD-10-CM | POA: Diagnosis not present

## 2016-08-15 DIAGNOSIS — Z951 Presence of aortocoronary bypass graft: Secondary | ICD-10-CM | POA: Insufficient documentation

## 2016-08-15 DIAGNOSIS — I739 Peripheral vascular disease, unspecified: Secondary | ICD-10-CM | POA: Insufficient documentation

## 2016-08-15 DIAGNOSIS — I129 Hypertensive chronic kidney disease with stage 1 through stage 4 chronic kidney disease, or unspecified chronic kidney disease: Secondary | ICD-10-CM | POA: Diagnosis not present

## 2016-08-15 DIAGNOSIS — N210 Calculus in bladder: Secondary | ICD-10-CM | POA: Insufficient documentation

## 2016-08-15 DIAGNOSIS — Z7952 Long term (current) use of systemic steroids: Secondary | ICD-10-CM | POA: Diagnosis not present

## 2016-08-15 DIAGNOSIS — N3289 Other specified disorders of bladder: Secondary | ICD-10-CM | POA: Insufficient documentation

## 2016-08-15 HISTORY — PX: HOLMIUM LASER APPLICATION: SHX5852

## 2016-08-15 HISTORY — PX: TRANSURETHRAL RESECTION OF BLADDER TUMOR: SHX2575

## 2016-08-15 HISTORY — PX: CYSTOSCOPY WITH BIOPSY: SHX5122

## 2016-08-15 SURGERY — CYSTOSCOPY, WITH BIOPSY
Anesthesia: General | Site: Bladder

## 2016-08-15 MED ORDER — STERILE WATER FOR IRRIGATION IR SOLN
Status: DC | PRN
  Administered 2016-08-15: 5000 mL

## 2016-08-15 MED ORDER — TRAMADOL-ACETAMINOPHEN 37.5-325 MG PO TABS: 1.0000 | ORAL_TABLET | Freq: Four times a day (QID) | ORAL | 2 refills | Status: DC | PRN

## 2016-08-15 MED ORDER — ACETAMINOPHEN 500 MG PO TABS
1000.0000 mg | ORAL_TABLET | Freq: Four times a day (QID) | ORAL | Status: DC | PRN
Start: 2016-08-15 — End: 2016-08-15

## 2016-08-15 MED ORDER — ONDANSETRON HCL 4 MG/2ML IJ SOLN
INTRAMUSCULAR | Status: DC | PRN
  Administered 2016-08-15: 4 mg via INTRAVENOUS

## 2016-08-15 MED ORDER — FLUORESCEIN SODIUM 10 % IV SOLN
INTRAVENOUS | Status: DC | PRN
  Administered 2016-08-15: 50 mg via INTRAVENOUS

## 2016-08-15 MED ORDER — PROMETHAZINE HCL 25 MG/ML IJ SOLN: 6.2500 mg | INTRAMUSCULAR | Status: DC | PRN

## 2016-08-15 MED ORDER — MIDAZOLAM HCL 2 MG/2ML IJ SOLN
INTRAMUSCULAR | Status: AC
  Filled 2016-08-15: qty 2

## 2016-08-15 MED ORDER — PROPOFOL 10 MG/ML IV BOLUS
INTRAVENOUS | Status: DC | PRN
  Administered 2016-08-15: 150 mg via INTRAVENOUS

## 2016-08-15 MED ORDER — PROPOFOL 10 MG/ML IV BOLUS
INTRAVENOUS | Status: AC
  Filled 2016-08-15: qty 20

## 2016-08-15 MED ORDER — FENTANYL CITRATE (PF) 100 MCG/2ML IJ SOLN: 25.0000 ug | INTRAMUSCULAR | Status: DC | PRN

## 2016-08-15 MED ORDER — PHENAZOPYRIDINE HCL 200 MG PO TABS: 200.0000 mg | ORAL_TABLET | Freq: Three times a day (TID) | ORAL | 0 refills | Status: DC | PRN

## 2016-08-15 MED ORDER — CIPROFLOXACIN IN D5W 400 MG/200ML IV SOLN
400.0000 mg | INTRAVENOUS | Status: AC
  Administered 2016-08-15: 400 mg via INTRAVENOUS

## 2016-08-15 MED ORDER — SODIUM CHLORIDE 0.9 % IR SOLN
Status: DC | PRN
  Administered 2016-08-15: 3000 mL

## 2016-08-15 MED ORDER — LIDOCAINE HCL (CARDIAC) 20 MG/ML IV SOLN
INTRAVENOUS | Status: DC | PRN
  Administered 2016-08-15: 100 mg via INTRAVENOUS

## 2016-08-15 MED ORDER — 0.9 % SODIUM CHLORIDE (POUR BTL) OPTIME
TOPICAL | Status: DC | PRN
  Administered 2016-08-15: 1000 mL

## 2016-08-15 MED ORDER — FENTANYL CITRATE (PF) 100 MCG/2ML IJ SOLN
INTRAMUSCULAR | Status: AC
  Filled 2016-08-15: qty 2

## 2016-08-15 MED ORDER — LACTATED RINGERS IV SOLN
INTRAVENOUS | Status: DC | PRN
  Administered 2016-08-15: 07:00:00 via INTRAVENOUS

## 2016-08-15 MED ORDER — CIPROFLOXACIN IN D5W 400 MG/200ML IV SOLN
INTRAVENOUS | Status: AC
  Filled 2016-08-15: qty 200

## 2016-08-15 MED ORDER — LIDOCAINE 2% (20 MG/ML) 5 ML SYRINGE
INTRAMUSCULAR | Status: AC
  Filled 2016-08-15: qty 5

## 2016-08-15 MED ORDER — MIDAZOLAM HCL 5 MG/5ML IJ SOLN
INTRAMUSCULAR | Status: DC | PRN
  Administered 2016-08-15: 2 mg via INTRAVENOUS

## 2016-08-15 MED ORDER — ONDANSETRON HCL 4 MG/2ML IJ SOLN
INTRAMUSCULAR | Status: AC
  Filled 2016-08-15: qty 2

## 2016-08-15 MED ORDER — LIDOCAINE HCL 2 % EX GEL
CUTANEOUS | Status: AC
  Filled 2016-08-15: qty 5

## 2016-08-15 MED ORDER — FENTANYL CITRATE (PF) 100 MCG/2ML IJ SOLN
INTRAMUSCULAR | Status: DC | PRN
  Administered 2016-08-15 (×2): 25 ug via INTRAVENOUS
  Administered 2016-08-15: 50 ug via INTRAVENOUS
  Administered 2016-08-15: 25 ug via INTRAVENOUS

## 2016-08-15 MED ORDER — BELLADONNA ALKALOIDS-OPIUM 16.2-60 MG RE SUPP
RECTAL | Status: AC
  Filled 2016-08-15: qty 1

## 2016-08-15 MED ORDER — PHENAZOPYRIDINE HCL 200 MG PO TABS
200.0000 mg | ORAL_TABLET | Freq: Once | ORAL | Status: AC
  Administered 2016-08-15: 200 mg via ORAL
  Filled 2016-08-15: qty 1

## 2016-08-15 MED ORDER — LACTATED RINGERS IV SOLN: INTRAVENOUS | Status: DC

## 2016-08-15 MED FILL — PHENAZOPYRIDINE 200 MG TAB: 200 | 4 days supply | Qty: 10 | Fill #0

## 2016-08-15 MED FILL — TRAMADOL-ACETAMINOPHN 37.5-: 37.5-325 | 8 days supply | Qty: 30 | Fill #0

## 2016-08-15 SURGICAL SUPPLY — 19 items
BAG URINE DRAINAGE (UROLOGICAL SUPPLIES) IMPLANT
BAG URO CATCHER STRL LF (MISCELLANEOUS) ×2 IMPLANT
BASKET ZERO TIP 1.9FR (BASKET) ×1 IMPLANT
BSKT STON RTRVL ZERO TP 1.9FR (BASKET) ×1
FIBER LASER FLEXIVA 1000 (UROLOGICAL SUPPLIES) IMPLANT
FIBER LASER FLEXIVA 365 (UROLOGICAL SUPPLIES) IMPLANT
FIBER LASER FLEXIVA 550 (UROLOGICAL SUPPLIES) ×1 IMPLANT
FIBER LASER TRAC TIP (UROLOGICAL SUPPLIES) IMPLANT
GLOVE BIOGEL M STRL SZ7.5 (GLOVE) ×2 IMPLANT
GOWN STRL REUS W/TWL LRG LVL3 (GOWN DISPOSABLE) ×2 IMPLANT
GOWN STRL REUS W/TWL XL LVL3 (GOWN DISPOSABLE) ×2 IMPLANT
HOLDER FOLEY CATH W/STRAP (MISCELLANEOUS) IMPLANT
IV NS IRRIG 3000ML ARTHROMATIC (IV SOLUTION) ×4 IMPLANT
LOOP CUT BIPOLAR 24F LRG (ELECTROSURGICAL) ×1 IMPLANT
MANIFOLD NEPTUNE II (INSTRUMENTS) ×2 IMPLANT
NS IRRIG 1000ML POUR BTL (IV SOLUTION) ×2 IMPLANT
PACK CYSTO (CUSTOM PROCEDURE TRAY) ×2 IMPLANT
SYRINGE IRR TOOMEY STRL 70CC (SYRINGE) ×1 IMPLANT
TUBING CONNECTING 10 (TUBING) ×3 IMPLANT

## 2016-08-15 NOTE — Anesthesia Preprocedure Evaluation (Addendum)
Anesthesia Evaluation  Patient identified by MRN, date of birth, ID band Patient awake    Reviewed: Allergy & Precautions, NPO status , Patient's Chart, lab work & pertinent test results  Airway Mallampati: II  TM Distance: >3 FB Neck ROM: Full    Dental  (+) Edentulous Upper, Edentulous Lower   Pulmonary former smoker,    Pulmonary exam normal breath sounds clear to auscultation       Cardiovascular hypertension, + CAD, + Past MI and + Peripheral Vascular Disease  Normal cardiovascular exam+ dysrhythmias + Valvular Problems/Murmurs  Rhythm:Regular Rate:Normal     Neuro/Psych  Headaches, CVA negative psych ROS   GI/Hepatic negative GI ROS, Neg liver ROS,   Endo/Other  negative endocrine ROS  Renal/GU Renal diseaseS/P renal transplant.  Cr 1.29  negative genitourinary   Musculoskeletal negative musculoskeletal ROS (+)   Abdominal   Peds negative pediatric ROS (+)  Hematology negative hematology ROS (+)   Anesthesia Other Findings   Reproductive/Obstetrics negative OB ROS                            Anesthesia Physical Anesthesia Plan  ASA: III  Anesthesia Plan: General   Post-op Pain Management:    Induction: Intravenous  Airway Management Planned: LMA  Additional Equipment:   Intra-op Plan:   Post-operative Plan: Extubation in OR  Informed Consent: I have reviewed the patients History and Physical, chart, labs and discussed the procedure including the risks, benefits and alternatives for the proposed anesthesia with the patient or authorized representative who has indicated his/her understanding and acceptance.   Dental advisory given  Plan Discussed with: CRNA  Anesthesia Plan Comments:         Anesthesia Quick Evaluation

## 2016-08-15 NOTE — H&P (Signed)
Office Visit Report     07/01/2016   --------------------------------------------------------------------------------   [name]  MRN: [id number]  PRIMARY CARE:  Jamal Maes, MD  DOB: 1961/02/10, 55 year old Male  REFERRING:  Jimmey Ralph, NP  [id number]  PROVIDER:  Carolan Clines, M.D.    LOCATION:  [facility] [phone]   --------------------------------------------------------------------------------   CC: I have bladder stones.  HPI: [name] is a 55 year-old male established patient who is here for bladder stones.  His problem was diagnosed 05/18/2016. The diagnosis was made by D. Warden/ Edna Rede. His symptoms include groin pain. Patient denies having flank pain, back pain, nausea, vomiting, fever, chills, and blood in urine.   He has not had prior urinary tract or prostate infections.   He does not have trouble emptying his bladder at this time. He does have pain or burning when he urinates. He does not have an abnormal sensation when needing to urinate. He does not have to strain or bear down to start his urinary stream. He does not dribble at the end of urination.   He has not previously had an indwelling catheter in for more than two weeks at a time. He has had no prostate surgery.   Pt was seen by Diane 05/28/16 c/o Pelvic pain and urgency X 1 week. He had not required any meds. Pt seen a nephrologist 2 weeks prior to ov and there was a slight rise in Crea but otherwise no acute issues noted with transplanted kidney. Denied changes in bowel movements. Hx of B inguinal hernia repair with mesh.   Per Diane CT was ordered. Ct done on 05/29/16 and showed 8 by 78mm bladder calculus on Rt. side. There was no bladder distention.     AUA Symptom Score: He never has the sensation of not emptying his bladder completely after finishing urinating. Less than 20% of the time he has to urinate again fewer than two hours after he has finished urinating. He  does not have to stop and start again several times when he urinates. He never finds it difficult to postpone urination. He never has a weak urinary stream. He never has to push or strain to begin urination. He has to get up to urinate 1 time from the time he goes to bed until the time he gets up in the morning.   Calculated AUA Symptom Score: 2    ALLERGIES: Penicillins - Itching    MEDICATIONS: Allopurinol 100 MG Oral Tablet Oral  Aspirin 81 MG TABS Oral  Cialis 5 MG Oral Tablet 0 Oral  CloNIDine HCl - 0.2 MG Oral Tablet Oral  Fenofibrate 134 MG CAPS Oral  Labetalol HCl TABS Oral  Lovaza 1 GM Oral Capsule Oral  Magnesium TABS Oral  Myfortic TBEC Oral  PredniSONE (Pak) 5 MG TABS Oral  Prograf 1 MG Oral Capsule 0 Oral  Simvastatin 40 MG Oral Tablet Oral  Tramadol Hcl 50 mg tablet 1 tablet PO Q 6 H  Vitamin D2 50,000 unit capsule     GU PSH: Hernia Repair - 2012      PSH Notes: Coronary Artery Four Or More Arterial Bypass Grafts, Leg Repair, Arteriovenous Surgery Creation Of A-V Fistula, Hernia Repair   NON-GU PSH: Cabg; Arterial; Four Or More - 2013 Transplant Kidney    GU PMH: Chronic Kidney Dis Unspec, Chronic kidney disease (CKD), active medical management without dialysis - 01/02/2016 ED, arterial insufficiency, Erectile dysfunction due to arterial insufficiency - 01/02/2016 Other microscopic hematuria,  Microscopic hematuria - 2014    NON-GU PMH: Encounter for general adult medical examination without abnormal findings, Encounter for preventive health examination - 01/02/2016 Gout, unspecified, Gout - 2014 Personal history of other diseases of the circulatory system, History of hypertension - 2014 Personal history of transient ischemic attack (TIA), and cerebral infarction without residual deficits, History of transient cerebral ischemia - 2014    FAMILY HISTORY: adopted - Runs In Family Family Health Status Number - Runs In Family No pertinent family history - Other    SOCIAL HISTORY: Marital Status: Married Current Smoking Status: Patient smokes occasionally.  Social Drinker.  Does not drink caffeine.     Notes: Former smoker, Tobacco use, Tobacco use, Caffeine Use, Occupation:, Marital History - Currently Married, Being A Social Drinker   REVIEW OF SYSTEMS:    GU Review Male:   Patient reports frequent urination and burning/ pain with urination. Patient denies hard to postpone urination, get up at night to urinate, leakage of urine, stream starts and stops, trouble starting your stream, have to strain to urinate , erection problems, and penile pain.  Gastrointestinal (Upper):   Patient denies nausea, vomiting, and indigestion/ heartburn.  Gastrointestinal (Lower):   Patient denies diarrhea and constipation.  Constitutional:   Patient denies fever, night sweats, weight loss, and fatigue.  Skin:   Patient denies skin rash/ lesion and itching.  Eyes:   Patient denies blurred vision and double vision.  Ears/ Nose/ Throat:   Patient denies sore throat and sinus problems.  Hematologic/Lymphatic:   Patient denies swollen glands and easy bruising.  Cardiovascular:   Patient denies leg swelling and chest pains.  Respiratory:   Patient denies cough and shortness of breath.  Endocrine:   Patient denies excessive thirst.  Musculoskeletal:   Patient denies back pain and joint pain.  Neurological:   Patient denies headaches and dizziness.  Psychologic:   Patient denies depression and anxiety.   VITAL SIGNS:      07/01/2016 03:45 PM  BP 131/82 mmHg  Pulse 56 /min  Temperature 97.6 F / 36 C   GU PHYSICAL EXAMINATION:    Anus and Perineum: No hemorrhoids. No anal stenosis. No rectal fissure, no anal fissure. No edema, no dimple, no perineal tenderness, no anal tenderness.  Scrotum: No lesions. No edema. No cysts. No warts.  Epididymides: Right: no spermatocele, no masses, no cysts, no tenderness, no induration, no enlargement. Left: no spermatocele, no masses,  no cysts, no tenderness, no induration, no enlargement.  Testes: No tenderness, no swelling, no enlargement left testes. No tenderness, no swelling, no enlargement right testes. Normal location left testes. Normal location right testes. No mass, no cyst, no varicocele, no hydrocele left testes. No mass, no cyst, no varicocele, no hydrocele right testes.  Urethral Meatus: Normal size. No lesion, no wart, no discharge, no polyp. Normal location.  Penis: Circumcised, no warts, no cracks. No dorsal Peyronie's plaques, no left corporal Peyronie's plaques, no right corporal Peyronie's plaques, no scarring, no warts. No balanitis, no meatal stenosis.  Prostate: 40 gram or 2+ size. Left lobe normal consistency, right lobe normal consistency. Symmetrical lobes. No prostate nodule. Left lobe no tenderness, right lobe no tenderness.  Seminal Vesicles: Nonpalpable.  Sphincter Tone: Normal sphincter. No rectal tenderness. No rectal mass.    MULTI-SYSTEM PHYSICAL EXAMINATION:    Constitutional: Well-nourished. No physical deformities. Normally developed. Good grooming.  Neck: Neck symmetrical, not swollen. Normal tracheal position.  Respiratory: No labored breathing, no use of accessory muscles.  Cardiovascular: Normal temperature, normal extremity pulses, no swelling, no varicosities.  Lymphatic: No enlargement of neck, axillae, groin.  Skin: No paleness, no jaundice, no cyanosis. No lesion, no ulcer, no rash.  Neurologic / Psychiatric: Oriented to time, oriented to place, oriented to person. No depression, no anxiety, no agitation.  Gastrointestinal: Right gibson scar, left inguinal hernia scar, right inguinal hernia scar, umbilical hernia scar. No mass, no tenderness, no rigidity, non obese abdomen.   Musculoskeletal: Normal gait and station of head and neck.     PAST DATA REVIEWED:  Source Of History:  Patient   PROCEDURES:         Flexible Cystoscopy - 52000  Risks, benefits, and some of the  potential complications of the procedure were discussed at length with the patient including infection, bleeding, voiding discomfort, urinary retention, fever, chills, sepsis, and others. All questions were answered. Informed consent was obtained. Antibiotic prophylaxis was given. Sterile technique and intraurethral analgesia were used.  Meatus:  Normal size. Normal location. Normal condition.  Urethra:  No strictures.  External Sphincter:  Normal.  Verumontanum:  Normal.  Prostate:  Non-obstructing. No hyperplasia.  Bladder Neck:  Non-obstructing.  Ureteral Orifices:  Normal location. Normal size. Normal shape. Effluxed clear urine.  Bladder:  One small stone. Right lateral wall, stuck on wall. 3 cm size, multifaceted. No trabeculation. No tumors. Normal mucosa.      The lower urinary tract was carefully examined. The procedure was well-tolerated and without complications. Antibiotic instructions were given. Instructions were given to call the office immediately for bloody urine, difficulty urinating, urinary retention, painful or frequent urination, fever, chills, nausea, vomiting or other illness. The patient stated that he understood these instructions and would comply with them.   ASSESSMENT:      ICD-10 Details  1 GU:   Bladder Stone - N21.0   2   Chronic Kidney Dis Unspec - N18.9   3 NON-GU:   Kidney transplant status - Z94.0           Notes:   Marden Noble is a 55 year old male, living related transplant recipient, at [facility], in 2014. The patient has done well, with a creatinine of approximately 1.5, treated locally by Dr. Jamal Maes. Note his past history of gout. He currently has suprapubic pain, and penile discomfort, with cystoscopy showing a 3 cm right lateral bladder wall stone. While the stone could be uric acid in nature, it is noted that the stone is stuck to the right lateral wall. With maneuvering from the cystoscope, I am able to push the stone off the  wall, where I do see a large amount of edematous right lateral bladder wall. I do not see a permanent suture within the mucosal surface of the bladder, however, which could account for the bladder stone being stuck on the wall. The patient will need cystoscopy, and holmium laser fractionation of the stone, and removal. I will have a closer look at the right lateral bladder wall. There is no evidence of TCC.   While the trigone looked normal, I did not see the ureteral orifice for the transplanted kidney. I may give him floor seen dye at the time of his cystoscopy, if approved by Dr. Lorrene Reid.    PLAN:           Schedule Return Visit: Next Available Appointment - Schedule Surgery          Document Letter(s):  Created for Patient: Clinical Summary  Notes:   CC: Dr. Jamal Maes (patient will need preop approval for surgical removal of bladder stone, with injection of fluorescein dye, if approved by Dr. Lorrene Reid)     Signed by Carolan Clines, M.D. on 07/01/16 at 6:44 PM (EDT)     The information contained in this medical record document is considered private and confidential patient information. This information can only be used for the medical diagnosis and/or medical services that are being provided by the patient's selected caregivers. This information can only be distributed outside of the patient's care if the patient agrees and signs waivers of authorization for this information to be sent to an outside source or route.

## 2016-08-15 NOTE — Transfer of Care (Signed)
Immediate Anesthesia Transfer of Care Note  Patient: [name]  Procedure(s) Performed: Procedure(s): CYSTOSCOPY WITH IDENTIFICATION OF RIGHT TRANSPLANTATION OF URETRAL ORFICE WITH FLUORESCEIN (N/A) HOLMIUM LASER APPLICATION WITH 3 CM RIGHT BLADDER STONE (N/A) TRANSURETHRAL BIOPSY OF RIGHT BLADDER WALL (N/A)  Patient Location: PACU  Anesthesia Type:General  Level of Consciousness: awake, alert  and oriented  Airway & Oxygen Therapy: Patient Spontanous Breathing and Patient connected to face mask oxygen  Post-op Assessment: Report given to RN and Post -op Vital signs reviewed and stable  Post vital signs: Reviewed and stable  Last Vitals:  Vitals:   08/15/16 0543  BP: 124/82  Pulse: (!) 105  Resp: 18  Temp: 36.4 C    Last Pain:  Vitals:   08/15/16 0543  TempSrc: Oral      Patients Stated Pain Goal: 4 ([id number])  Complications: No apparent anesthesia complications

## 2016-08-15 NOTE — Interval H&P Note (Signed)
History and Physical Interval Note:  08/15/2016 7:31 AM  [name]  has presented today for surgery, with the diagnosis of BLADDER STONE  The various methods of treatment have been discussed with the patient and family. After consideration of risks, benefits and other options for treatment, the patient has consented to  Procedure(s): CYSTOSCOPY WITH BLADDER STONE AND BIOPSY (N/A) HOLMIUM LASER APPLICATION (N/A) as a surgical intervention .  The patient's history has been reviewed, patient examined, no change in status, stable for surgery.  I have reviewed the patient's chart and labs.  Questions were answered to the patient's satisfaction.     [name] I Siyona Coto

## 2016-08-15 NOTE — Progress Notes (Signed)
Dr. Delma Post in to see patient's EKG  Readings on the monitor- saw previous EKG  Copy-

## 2016-08-15 NOTE — Op Note (Signed)
Pre-operative diagnosis :  3cm Right bladder wall stone, Right lateral bladder wall edema, Right renal transplant  Postoperative diagnosis:  Same  Operation:  Cystourethroscopy, identification of right transplant ureteral orifice using Fluroecein contrast, holmium laser fractionation of bladder stone, transurethral resected biopsies of right bladder wall.  Laser energy: 0.5-1 J; rate 5 pulse per second; power 5.0 W; total energy 0.59 kJ.  Surgeon:  Chauncey Cruel. Gaynelle Arabian, MD  First assistant:  None  Anesthesia:  Gen. LMA  Preparation:  After appropriate pre-anesthesia, the patient was brought the operating room, placed on the operating table in the dorsal supine position where general LMA anesthesia was introduced. He was then replaced in the dorsal lithotomy position with the pubis was prepped with Betadine solution and draped in usual fashion. The history was reviewed. The armband was double checked.  Review history:  [name] is a 55 year old male, living related transplant recipient, at [facility], in 2014. The patient has done well, with a creatinine of approximately 1.5, treated locally by Dr. Jamal Maes. Note his past history of gout. He currently has suprapubic pain, and penile discomfort, with cystoscopy showing a 3 cm right lateral bladder wall stone. While the stone could be uric acid in nature, it is noted that the stone is stuck to the right lateral wall. With maneuvering from the cystoscope, I am able to push the stone off the wall, where I do see a large amount of edematous right lateral bladder wall. I do not see a permanent suture within the mucosal surface of the bladder, however, which could account for the bladder stone being stuck on the wall. The patient will need cystoscopy, and holmium laser fractionation of the stone, and removal. I will have a closer look at the right lateral bladder wall. There is no evidence of TCC.   While the trigone looked normal, I  did not see the ureteral orifice for the transplanted kidney. I may give him floor seen dye at the time of his cystoscopy, if approved by Dr. Lorrene Reid.  Statement of  Likelihood of Success: Excellent. TIME-OUT observed.:  Procedure: Cystourethroscopy was, with both the 30 lens, and the 70 lens. Fluorecin contrast, 0.5 mL, was given, and ordered to identify the ureteral orifice, on the right lateral bladder wall. A 3 center meter stone was stuck within an edematous area of the right lateral bladder wall, just medial to the ureteral orifice. The stone was manipulated off of the edematous wall, and trapped in a basket. Using a 550  laser fiber, the patient underwent laser fractionation of the stone, using a total of 0.5 dyn kilojoules of energy. The entire stone fragments were irrigated free from the bladder. The right ureteral orifice was identified easily, and the edematous area, felt to be a suture eroding through the bladder wall was biopsied. I did not find a suture, however and was unwilling to deeply resect within the bladder wall, for fear of interfering with the transplant. No bleeding was noted. I elected to not leave a Foley catheter. The patient was awakened and taken to recovery room in good condition.

## 2016-08-15 NOTE — Discharge Instructions (Signed)
Home Care Instructions:  Activity Rest for the remainder of the day. Do not drive or operate equipment today. You may resume normal activities in one to two days unless specified otherwise by Dr. Janice Norrie.  Meals Drink plenty of liquids and eat light foods such as gelatin or soup this evening. You may return to a normal meal plan tomorrow.  Return to work You may return to work in one to two days unless specified otherwise by Dr. Janice Norrie.  Special Instructions Call [phone] if you have any of the following symptoms:  Persistent or heavy bleeding  Bleeding which continues after the first few urinations  Large blood clots that are difficult to pass  Urine stream diminishes or stops completely  Fever equal to or higher than 101 degrees F  Cloudy urine with a strong, foul odor  Severe pain  Females should always wipe from front to back. You may feel some burning pain when you urinate. This should disappear with time. Applying moist heat to the lower abdomen or a hot tub bath may help relieve the pain.  Follow-up In one week.  My offfice will call you for the appointment.  Additional Instructions You may see some blood in your urine (especially males) the first few times you urinate. You may resume sexual activity in one to two days.   Home Care Instructions:  Activity Rest for the remainder of the day. Do not drive or operate equipment today. You may resume normal activities in one to two days unless specified otherwise by Dr. Janice Norrie.  Meals Drink plenty of liquids and eat light foods such as gelatin or soup this evening. You may return to a normal meal plan tomorrow.  Return to work You may return to work in one to two days unless specified otherwise by Dr. Janice Norrie.  Special Instructions Call [phone] if you have any of the following symptoms:  Persistent or heavy bleeding  Bleeding which continues after the first few urinations  Large blood clots that are difficult to  pass  Urine stream diminishes or stops completely  Fever equal to or higher than 101 degrees F  Cloudy urine with a strong, foul odor  Severe pain  Females should always wipe from front to back. You may feel some burning pain when you urinate. This should disappear with time. Applying moist heat to the lower abdomen or a hot tub bath may help relieve the pain.  Follow-up In one week.  My offfice will call you for the appointment.  Additional Instructions You may see some blood in your urine (especially males) the first few times you urinate. You may resume sexual activity in one to two days.   Home Care Instructions:  Activity Rest for the remainder of the day. Do not drive or operate equipment today. You may resume normal activities in one to two days unless specified otherwise by Dr. Janice Norrie.  Meals Drink plenty of liquids and eat light foods such as gelatin or soup this evening. You may return to a normal meal plan tomorrow.  Return to work You may return to work in one to two days unless specified otherwise by Dr. Janice Norrie.  Special Instructions Call [phone] if you have any of the following symptoms:  Persistent or heavy bleeding  Bleeding which continues after the first few urinations  Large blood clots that are difficult to pass  Urine stream diminishes or stops completely  Fever equal to or higher than 101 degrees F  Cloudy urine with a  strong, foul odor  Severe pain  Females should always wipe from front to back. You may feel some burning pain when you urinate. This should disappear with time. Applying moist heat to the lower abdomen or a hot tub bath may help relieve the pain.  Follow-up In one week.  My offfice will call you for the appointment.  Additional Instructions You may see some blood in your urine (especially males) the first few times you urinate. You may resume sexual activity in one to two days.   Pre-Op Diagnosis:   Retained L lower Ureteral  stone  Post-op Dx: Same  DISCHARGE INSTRUCTIONS FOR KIDNEY STONES OR URETERAL STENT  MEDICATIONS:   1. DO NOT RESUME YOUR ASPIRIN, or any other medicines like ibuprofen, motrin, excedrin, advil, aleve, vitamin E, fish oil as these can all cause bleeding x 7 days.  2. Resume all your other meds from home - except do not take any other pain meds that you may have at home.  ACTIVITY 1. No strenuous activity x 1week 2. No driving while on narcotic pain medications 3. Drink plenty of water 4. Continue to walk at home - you can still get blood clots when you are at home, so keep active, but don't over do it. 5. May return to work in 3 days.  BATHING 1. You can shower and we recommend daily showers  2. If you have a string coming from your urethra:  The stent string is attached to your ureteral stent.  Do not pull on this.   SIGNS/SYMPTOMS TO CALL: 1. Please call us if you have a fever greater than 101.5, uncontrolled  nausea/vomiting, uncontrolled pain, dizziness, unable to urinate, bloody urine, chest pain, shortness of breath, leg swelling, leg pain, redness around wound, drainage from wound, or any other concerns or questions.  You can reach [place] at [phone].  FOLLOW-UP You have an appointment: call [phone] for appointment for stent removal.    You may feel an odd sensation in your back. OK for occasional blood in the urine.

## 2016-08-15 NOTE — Anesthesia Procedure Notes (Signed)
Procedure Name: LMA Insertion Date/Time: 08/15/2016 7:42 AM Performed by: Glory Buff Pre-anesthesia Checklist: Patient identified, Emergency Drugs available, Suction available and Patient being monitored Patient Re-evaluated:Patient Re-evaluated prior to inductionOxygen Delivery Method: Circle system utilized Preoxygenation: Pre-oxygenation with 100% oxygen Intubation Type: IV induction Ventilation: Mask ventilation without difficulty LMA: LMA inserted LMA Size: 4.0 Laser Tube: Cuffed inflated with minimal occlusive pressure - saline Number of attempts: 1 Placement Confirmation: CO2 detector Tube secured with: Tape Dental Injury: Teeth and Oropharynx as per pre-operative assessment

## 2016-08-15 NOTE — Anesthesia Postprocedure Evaluation (Signed)
Anesthesia Post Note  Patient: [name] No  Procedure(s) Performed: Procedure(s) (LRB): CYSTOSCOPY WITH IDENTIFICATION OF RIGHT TRANSPLANTATION OF URETRAL ORFICE WITH FLUORESCEIN (N/A) HOLMIUM LASER APPLICATION WITH 3 CM RIGHT BLADDER STONE (N/A) TRANSURETHRAL BIOPSY OF RIGHT BLADDER WALL (N/A)  Patient location during evaluation: PACU Anesthesia Type: General Level of consciousness: awake and alert Pain management: pain level controlled Vital Signs Assessment: post-procedure vital signs reviewed and stable Respiratory status: spontaneous breathing, nonlabored ventilation, respiratory function stable and patient connected to nasal cannula oxygen Cardiovascular status: blood pressure returned to baseline and stable Postop Assessment: no signs of nausea or vomiting Anesthetic complications: no    Last Vitals:  Vitals:   08/15/16 1000 08/15/16 1037  BP: 111/69 109/75  Pulse: (!) 57 (!) 52  Resp: 12 16  Temp: 36.4 C     Last Pain:  Vitals:   08/15/16 1037  TempSrc:   PainSc: 2                  Khaila Velarde J

## 2016-08-23 MED FILL — CIALIS 5 MG TABLET: 5 | 30 days supply | Qty: 30 | Fill #8

## 2016-08-23 MED FILL — TACROLIMUS 1 MG CAPSULE: 1 | 30 days supply | Qty: 60 | Fill #4

## 2016-08-23 MED FILL — SULFAMETHOXAZOLE/TMP SS TAB: 400-80 | 28 days supply | Qty: 12 | Fill #4

## 2016-08-26 MED FILL — FENOFIBRATE 134 MG CAPSULE: 134 | 30 days supply | Qty: 30 | Fill #0

## 2016-08-28 DIAGNOSIS — N210 Calculus in bladder: Secondary | ICD-10-CM | POA: Diagnosis not present

## 2016-08-29 ENCOUNTER — Encounter: Payer: Self-pay | Admitting: Internal Medicine

## 2016-08-30 ENCOUNTER — Encounter: Payer: Self-pay | Admitting: Internal Medicine

## 2016-08-30 ENCOUNTER — Ambulatory Visit (INDEPENDENT_AMBULATORY_CARE_PROVIDER_SITE_OTHER): Payer: 59 | Admitting: Internal Medicine

## 2016-08-30 VITALS — BP 120/76 | HR 48 | Ht 69.0 in | Wt 188.2 lb

## 2016-08-30 DIAGNOSIS — I422 Other hypertrophic cardiomyopathy: Secondary | ICD-10-CM

## 2016-08-30 DIAGNOSIS — I491 Atrial premature depolarization: Secondary | ICD-10-CM

## 2016-08-30 DIAGNOSIS — I483 Typical atrial flutter: Secondary | ICD-10-CM | POA: Diagnosis not present

## 2016-08-30 NOTE — Patient Instructions (Signed)

## 2016-08-30 NOTE — Progress Notes (Signed)
Patient Care Team: Hulan Fess, MD as PCP - General (Family Medicine)   HPI  [name] is a 55 y.o. male Seen in followup for concerns about atrial fibrillation in the context of significant left atrial enlargement prior atrial flutter for which he underwent catheter ablation 2013 and left ventricular hypertrophy.  He had a history of ischemic heart disease with prior bypass surgery 4/13 and had post CABG atrial fibrillation.  He has had recurrent infrequent episodes of prolonged irregular tachycardia palpitations. These have been associated with fatigue and dyspnea and residual weakness. There reminiscent of his rhythm disturbances with atrial fibrillation. AliveCor demonstrated PACs but no AFib--  Has hx of remote stroke.  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   9/17  CR 1.5  K 4.6  Untreated sleep apnea but this is better with 45 lb weight loss  Echocardiogram 9/15 demonstrated normal LV systolic function; qualitative comments were "severe concentric hypertrophy" although the measurements are 17/11 suggesting some degree of asymmetry Left atrial dimension was 53/2.4    MRI. 7/16 this demonstrated mid cavitary obliteration and asymmetric septal hypertrophy 18/11 consistent with HCM  ECG 3/16 demonstrated multiple atrial ectopic beats   He has a history of remote stroke.       Past Medical History:  Diagnosis Date  . CAD (coronary artery disease)    a. s/p CABG 03/2012: LIMA to LAD, free RIMA to OM1, SVG to D1, sequential SVG to AM and LPLB2, EVH via right thigh and leg.  . Cellulitis 04/13/2012   RLE saphenous vein harvest incision   . CKD (chronic kidney disease) stage 5, GFR less than 15 ml/min (HCC)    a. Diagnosed age 62, s/p yes transplant  . Dysrhythmia    A flutter, PACs and PVCs  . Elevated cholesterol   . Gout   . Hyperparathyroidism   . Hypertension   . Hypertrophic cardiomyopathy ([place])   . Migraines     . PAF and Flutter    a. Afib post-op CABG; AFlutter 10/2012  . Peripheral vascular disease ([place])   . Sinus node dysfunction-post termination pause    a. >8sec in setting of aflutter 10/2012  . Stroke [facility]) 1995; 2001   denies residual  . Superficial vein thrombosis    a. R greater saphenous 04/2012    Past Surgical History:  Procedure Laterality Date  . ATRIAL FLUTTER ABLATION N/A 11/09/2012   Procedure: ATRIAL FLUTTER ABLATION;  Surgeon: [name], MD;  Location: [facility] CATH LAB;  Service: Cardiovascular;  Laterality: N/A;  . AV FISTULA PLACEMENT  2011   left forearm  . AV FISTULA PLACEMENT    . CARDIAC CATHETERIZATION  03/16/12  . CORONARY ARTERY BYPASS GRAFT  03/19/2012   Procedure: CORONARY ARTERY BYPASS GRAFTING (CABG);  Surgeon: Rexene Alberts, MD;  Location: [place];  Service: Open Heart Surgery;  Laterality: N/A;  (B) MAMMARY  . CYSTOSCOPY WITH BIOPSY N/A 08/15/2016   Procedure: CYSTOSCOPY WITH IDENTIFICATION OF RIGHT TRANSPLANTATION OF URETRAL ORFICE WITH FLUORESCEIN;  Surgeon: Carolan Clines, MD;  Location: [facility] ORS;  Service: Urology;  Laterality: N/A;  . DENTAL SURGERY  2008   multiple  . [place]   left  . FRACTURE SURGERY    . HERNIA REPAIR  [id number]   umbilical  . HOLMIUM LASER APPLICATION N/A [id number]   Procedure: HOLMIUM LASER APPLICATION WITH 3 CM RIGHT BLADDER STONE;  Surgeon: Carolan Clines, MD;  Location: [facility] ORS;  Service: Urology;  Laterality: N/A;  . INGUINAL HERNIA REPAIR  09/2009   bilaterally  . KIDNEY TRANSPLANT    . LEFT HEART CATHETERIZATION WITH CORONARY ANGIOGRAM N/A 03/16/2012   Procedure: LEFT HEART CATHETERIZATION WITH CORONARY ANGIOGRAM;  Surgeon: Sherren Mocha, MD;  Location: [facility] CATH LAB;  Service: Cardiovascular;  Laterality: N/A;  . RENAL ARTERY STENT  2001  . TRANSURETHRAL RESECTION OF BLADDER TUMOR N/A 08/15/2016   Procedure: TRANSURETHRAL BIOPSY OF RIGHT BLADDER WALL;  Surgeon: Carolan Clines, MD;  Location: [facility]  ORS;  Service: Urology;  Laterality: N/A;    Current Outpatient Prescriptions  Medication Sig Dispense Refill  . allopurinol (ZYLOPRIM) 100 MG tablet Take 200 mg by mouth daily.     Marland Kitchen aspirin 81 MG EC tablet Take 1 tablet (81 mg total) by mouth daily. 30 tablet   . CIALIS 5 MG tablet Take 5 mg by mouth daily.   11  . cloNIDine (CATAPRES) 0.2 MG tablet Take 0.2 mg by mouth every evening.     . fenofibrate micronized (LOFIBRA) 134 MG capsule Take 134 mg by mouth daily before breakfast.    . labetalol (NORMODYNE) 200 MG tablet Take 1 tablet (200 mg total) by mouth 2 (two) times daily. 180 tablet 1  . Magnesium 400 MG CAPS Take 1 capsule by mouth daily.     . mycophenolate (MYFORTIC) 180 MG EC tablet Take 720 mg by mouth 2 (two) times daily.     . nitroGLYCERIN (NITROSTAT) 0.4 MG SL tablet Place 0.4 mg under the tongue every 5 (five) minutes x 3 doses as needed. For chest pain    . omega-3 acid ethyl esters (LOVAZA) 1 G capsule Take 2 g by mouth 2 (two) times daily.    Marland Kitchen omeprazole (PRILOSEC) 20 MG capsule Take 20 mg by mouth daily.  5  . prednisoLONE 5 MG TABS tablet Take 5 mg by mouth at bedtime.     . simvastatin (ZOCOR) 40 MG tablet Take 40 mg by mouth at bedtime.     . sulfamethoxazole-trimethoprim (BACTRIM DS) 800-160 MG per tablet Take 1 tablet by mouth 3 (three) times a week. Take 1 tab by mouth 3 times a week on Mon, Wed and Fri.    . tacrolimus (PROGRAF) 1 MG capsule Take 1 mg by mouth 2 (two) times daily.  5  . Vitamin D, Ergocalciferol, (DRISDOL) 50000 units CAPS capsule Take 50,000 Units by mouth 2 (two) times a week. On Tuesdays and Saturdays  5   No current facility-administered medications for this visit.     Allergies  Allergen Reactions  . Contrast Media [Iodinated Diagnostic Agents]     coded  . Penicillins Itching and Rash    Has patient had a PCN reaction causing immediate rash, facial/tongue/throat swelling, SOB or lightheadedness with hypotension: Yes Has patient had  a PCN reaction causing severe rash involving mucus membranes or skin necrosis: No Has patient had a PCN reaction that required hospitalization No Has patient had a PCN reaction occurring within the last 10 years: No If all of the above answers are "NO", then may proceed with Cephalosporin use.       Review of Systems negative except from HPI and PMH  Physical Exam BP 120/76   Pulse (!) 48   Ht 5\' 9"  (1.753 m)   Wt 188 lb 3.2 oz (85.4 kg)   SpO2 97%   BMI 27.79 kg/m  Well developed and well nourished in no acute distress  HENT normal E scleral and icterus clear Neck Supple JVP flat; carotids brisk and full Clear to ausculation  *Regular rate and rhythm, no murmurs gallops or rub Soft with active bowel sounds No clubbing cyanosis  Edema Alert and oriented, grossly normal motor and sensory function Skin Warm and Dry    Assessment and  Plan   Atrial Flutter  S/p RFCA  Freq PACs  LAE  LVH-Asymmetric 18/11  HCM    Sleep disordered breathing   Status post kidney transplant  Palps remain PACs thus far,  Highlighted the importance of documentation given high risks assoc with AF and HCM  He has found birth family but no 1 degree relatives  Lost a ton of weight and sleep apnea and BP are better  Current medicines are reviewed at length with the patient today .  The patient does not have concerns regarding medicines.

## 2016-09-06 MED FILL — cloNIDine HCL 0.2 MG TABS: 0.2 | 30 days supply | Qty: 30 | Fill #5

## 2016-09-06 MED FILL — VIT D2 1.25 MG (50,000 UNIT: 1.25 MG | 28 days supply | Qty: 8 | Fill #4

## 2016-09-06 MED FILL — ALLOPURINOL 100 MG TABLET: 100 | 30 days supply | Qty: 60 | Fill #5

## 2016-09-10 MED FILL — MYCOPHENOLIC ACID DR 180 MG: 180 | 30 days supply | Qty: 240 | Fill #0

## 2016-09-10 MED FILL — predniSONE 5 MG TABS: 5 | 90 days supply | Qty: 90 | Fill #0

## 2016-09-10 MED FILL — OMEPRAZOLE DR 20 MG CAPSULE: 20 | 90 days supply | Qty: 90 | Fill #0

## 2016-09-11 ENCOUNTER — Encounter: Payer: Self-pay | Admitting: Internal Medicine

## 2016-09-20 MED FILL — CIALIS 5 MG TABLET: 5 | 30 days supply | Qty: 30 | Fill #9

## 2016-09-20 MED FILL — TACROLIMUS 1 MG CAPSULE: 1 | 30 days supply | Qty: 60 | Fill #5

## 2016-09-20 MED FILL — SULFAMETHOXAZOLE/TMP SS TAB: 400-80 | 28 days supply | Qty: 12 | Fill #5

## 2016-09-20 MED FILL — FENOFIBRATE 134 MG CAPSULE: 134 | 30 days supply | Qty: 30 | Fill #1

## 2016-10-01 DIAGNOSIS — Z940 Kidney transplant status: Secondary | ICD-10-CM | POA: Diagnosis not present

## 2016-10-10 MED FILL — MYCOPHENOLIC ACID DR 180 MG: 180 | 30 days supply | Qty: 240 | Fill #1

## 2016-10-10 MED FILL — LABETALOL HCL 200 MG TABLET: 200 | 90 days supply | Qty: 180 | Fill #1

## 2016-10-10 MED FILL — OMEGA-3 ETHYL ESTERS 1 GM C: 1 | 90 days supply | Qty: 360 | Fill #2

## 2016-10-10 MED FILL — VIT D2 1.25 MG (50,000 UNIT: 1.25 MG | 28 days supply | Qty: 8 | Fill #5

## 2016-10-14 MED FILL — cloNIDine HCL 0.2 MG TABS: 0.2 | 90 days supply | Qty: 90 | Fill #0

## 2016-10-14 MED FILL — ALLOPURINOL 100 MG TABLET: 100 | 30 days supply | Qty: 60 | Fill #0

## 2016-10-17 MED FILL — FENOFIBRATE 134 MG CAPSULE: 134 | 30 days supply | Qty: 30 | Fill #2

## 2016-10-17 MED FILL — SULFAMETHOXAZOLE/TMP SS TAB: 400-80 | 28 days supply | Qty: 12 | Fill #6

## 2016-10-17 MED FILL — CIALIS 5 MG TABLET: 5 | 30 days supply | Qty: 30 | Fill #10

## 2016-11-04 MED FILL — TACROLIMUS 1 MG CAPSULE: 1 | 30 days supply | Qty: 60 | Fill #0

## 2016-11-06 DIAGNOSIS — M109 Gout, unspecified: Secondary | ICD-10-CM | POA: Diagnosis not present

## 2016-11-06 DIAGNOSIS — R739 Hyperglycemia, unspecified: Secondary | ICD-10-CM | POA: Diagnosis not present

## 2016-11-06 DIAGNOSIS — Z940 Kidney transplant status: Secondary | ICD-10-CM | POA: Diagnosis not present

## 2016-11-06 DIAGNOSIS — E785 Hyperlipidemia, unspecified: Secondary | ICD-10-CM | POA: Diagnosis not present

## 2016-11-07 MED FILL — MYCOPHENOLIC ACID DR 180 MG: 180 | 30 days supply | Qty: 240 | Fill #2

## 2016-11-07 MED FILL — VIT D2 1.25 MG (50,000 UNIT: 1.25 MG | 28 days supply | Qty: 8 | Fill #6

## 2016-11-07 MED FILL — ALLOPURINOL 100 MG TABLET: 100 | 30 days supply | Qty: 60 | Fill #1

## 2016-11-14 MED FILL — CIALIS 5 MG TABLET: 5 | 30 days supply | Qty: 30 | Fill #11

## 2016-11-14 MED FILL — FENOFIBRATE 134 MG CAPSULE: 134 | 30 days supply | Qty: 30 | Fill #3

## 2016-11-19 DIAGNOSIS — R739 Hyperglycemia, unspecified: Secondary | ICD-10-CM | POA: Diagnosis not present

## 2016-11-19 DIAGNOSIS — Z6831 Body mass index (BMI) 31.0-31.9, adult: Secondary | ICD-10-CM | POA: Diagnosis not present

## 2016-11-19 DIAGNOSIS — N210 Calculus in bladder: Secondary | ICD-10-CM | POA: Diagnosis not present

## 2016-11-19 DIAGNOSIS — Z940 Kidney transplant status: Secondary | ICD-10-CM | POA: Diagnosis not present

## 2016-11-19 DIAGNOSIS — I129 Hypertensive chronic kidney disease with stage 1 through stage 4 chronic kidney disease, or unspecified chronic kidney disease: Secondary | ICD-10-CM | POA: Diagnosis not present

## 2016-11-19 DIAGNOSIS — I421 Obstructive hypertrophic cardiomyopathy: Secondary | ICD-10-CM | POA: Diagnosis not present

## 2016-11-19 DIAGNOSIS — I2510 Atherosclerotic heart disease of native coronary artery without angina pectoris: Secondary | ICD-10-CM | POA: Diagnosis not present

## 2016-11-19 DIAGNOSIS — D751 Secondary polycythemia: Secondary | ICD-10-CM | POA: Diagnosis not present

## 2016-11-19 MED FILL — SULFAMETHOXAZOLE/TMP SS TAB: 400-80 | 28 days supply | Qty: 12 | Fill #0

## 2016-11-28 MED FILL — TACROLIMUS 1 MG CAPSULE: 1 | 30 days supply | Qty: 60 | Fill #1

## 2016-11-30 ENCOUNTER — Encounter (HOSPITAL_COMMUNITY): Payer: Self-pay

## 2016-11-30 ENCOUNTER — Emergency Department (HOSPITAL_COMMUNITY)
Admission: EM | Admit: 2016-11-30 | Discharge: 2016-12-01 | Disposition: A | Discharge status: Home / Self Care | Payer: 59 | Attending: Emergency Medicine | Admitting: Emergency Medicine

## 2016-11-30 ENCOUNTER — Emergency Department (HOSPITAL_COMMUNITY): Payer: 59

## 2016-11-30 DIAGNOSIS — Z8673 Personal history of transient ischemic attack (TIA), and cerebral infarction without residual deficits: Secondary | ICD-10-CM | POA: Insufficient documentation

## 2016-11-30 DIAGNOSIS — I480 Paroxysmal atrial fibrillation: Secondary | ICD-10-CM | POA: Insufficient documentation

## 2016-11-30 DIAGNOSIS — Z79899 Other long term (current) drug therapy: Secondary | ICD-10-CM | POA: Insufficient documentation

## 2016-11-30 DIAGNOSIS — N185 Chronic kidney disease, stage 5: Secondary | ICD-10-CM | POA: Insufficient documentation

## 2016-11-30 DIAGNOSIS — I2510 Atherosclerotic heart disease of native coronary artery without angina pectoris: Secondary | ICD-10-CM | POA: Insufficient documentation

## 2016-11-30 DIAGNOSIS — Z951 Presence of aortocoronary bypass graft: Secondary | ICD-10-CM | POA: Diagnosis not present

## 2016-11-30 DIAGNOSIS — I252 Old myocardial infarction: Secondary | ICD-10-CM | POA: Diagnosis not present

## 2016-11-30 DIAGNOSIS — Z87891 Personal history of nicotine dependence: Secondary | ICD-10-CM | POA: Diagnosis not present

## 2016-11-30 DIAGNOSIS — Z7982 Long term (current) use of aspirin: Secondary | ICD-10-CM | POA: Diagnosis not present

## 2016-11-30 DIAGNOSIS — R079 Chest pain, unspecified: Secondary | ICD-10-CM | POA: Diagnosis not present

## 2016-11-30 DIAGNOSIS — I120 Hypertensive chronic kidney disease with stage 5 chronic kidney disease or end stage renal disease: Secondary | ICD-10-CM | POA: Insufficient documentation

## 2016-11-30 DIAGNOSIS — R0789 Other chest pain: Secondary | ICD-10-CM | POA: Diagnosis present

## 2016-11-30 LAB — CBC WITH DIFFERENTIAL/PLATELET
BASOS ABS: 0 10*3/uL (ref 0.0–0.1)
BASOS PCT: 0 %
Eosinophils Absolute: 0.1 10*3/uL (ref 0.0–0.7)
Eosinophils Relative: 1 %
HEMATOCRIT: 53.7 % — AB (ref 39.0–52.0)
HEMOGLOBIN: 17.6 g/dL — AB (ref 13.0–17.0)
LYMPHS PCT: 12 %
Lymphs Abs: 1.2 10*3/uL (ref 0.7–4.0)
MCH: 26.7 pg (ref 26.0–34.0)
MCHC: 32.8 g/dL (ref 30.0–36.0)
MCV: 81.6 fL (ref 78.0–100.0)
MONO ABS: 1.1 10*3/uL — AB (ref 0.1–1.0)
Monocytes Relative: 11 %
NEUTROS ABS: 7.6 10*3/uL (ref 1.7–7.7)
NEUTROS PCT: 76 %
Platelets: 176 10*3/uL (ref 150–400)
RBC: 6.58 MIL/uL — AB (ref 4.22–5.81)
RDW: 15.9 % — ABNORMAL HIGH (ref 11.5–15.5)
WBC: 10.1 10*3/uL (ref 4.0–10.5)

## 2016-11-30 LAB — I-STAT TROPONIN, ED: Troponin i, poc: 0.08 ng/mL (ref 0.00–0.08)

## 2016-11-30 MED ORDER — DILTIAZEM LOAD VIA INFUSION
15.0000 mg | Freq: Once | INTRAVENOUS | Status: AC
  Administered 2016-11-30: 15 mg via INTRAVENOUS
  Filled 2016-11-30: qty 15

## 2016-11-30 MED ORDER — DILTIAZEM HCL-DEXTROSE 100-5 MG/100ML-% IV SOLN (PREMIX)
5.0000 mg/h | INTRAVENOUS | Status: DC
  Administered 2016-11-30: 10 mg/h via INTRAVENOUS
  Filled 2016-11-30: qty 100

## 2016-11-30 NOTE — ED Notes (Signed)
No respiratory or acute distress noted alert and oriented x 3 no reaction to medication noted. 

## 2016-11-30 NOTE — ED Provider Notes (Signed)
[facility] DEPT Provider Note   CSN: [id number] Arrival date & time: 11/30/16  2301  By signing my name below, I, [name], attest that this documentation has been prepared under the direction and in the presence of [employer], PA-C. Electronically Signed: Norris Haley , ED Scribe. 12/01/16. 3:26 AM.   History   Chief Complaint Chief Complaint  Patient presents with  . Chest Pain    HPI  HPI Comments: [name] is a 55 y.o. male with hx of CAD s/p CABG, PAF and flutter (on ASA only), HTN, HOCM, HLD, and CKD s/p renal transplant in 2014 (still on Prograf and Myfortic) who presents to the Emergency Department complaining of mild to moderate chest pain with sudden onset x 6 hours ago. Chest pain is described as a tightness. It is nonradiating. Pt also reports intermittent SOB. Pt states that while eating earlier he started to experience a sharp chest pain similar to his past episodes of A-Fib. Pt states that he took Metoprolol 50mg  at 5:30PM and another 100mg  at 9:30PM without relief. Pt denies fever, URI symptoms, nausea, vomiting and lightheadedness. Pt denies blood thinner use but states that he uses a baby aspirin once daily.   Cardiology - Drs. Caryl Comes and [place]  HPI  Past Medical History:  Diagnosis Date  . CAD (coronary artery disease)    a. s/p CABG 03/2012: LIMA to LAD, free RIMA to OM1, SVG to D1, sequential SVG to AM and LPLB2, EVH via right thigh and leg.  . Cellulitis 04/13/2012   RLE saphenous vein harvest incision   . CKD (chronic kidney disease) stage 5, GFR less than 15 ml/min (HCC)    a. Diagnosed age 34, s/p yes transplant  . Dysrhythmia    A flutter, PACs and PVCs  . Elevated cholesterol   . Gout   . Hyperparathyroidism   . Hypertension   . Hypertrophic cardiomyopathy ([place])   . Migraines   . PAF and Flutter    a. Afib post-op CABG; AFlutter 10/2012  . Peripheral vascular disease ([place])   . Sinus node dysfunction-post termination pause      a. >8sec in setting of aflutter 10/2012  . Stroke [facility]) 1995; 2001   denies residual  . Superficial vein thrombosis    a. R greater saphenous 04/2012    Patient Active Problem List   Diagnosis Date Noted  . Murmur 02/16/2014  . Atrial flutter ([place]) 11/09/2012  . Long term (current) use of anticoagulants 11/02/2012  . NSTEMI (non-ST elevated myocardial infarction) ([place]) 10/28/2012  . CAD (coronary artery disease) of artery bypass graft 10/28/2012  . Sinus node dysfunction-post termination pause   . Stroke ([place])   . PAF and Aflutter 07/09/2012  . S/P CABG (coronary artery bypass graft) 05/12/2012  . HTN (hypertension) 01/15/2012  . Smoking 01/15/2012  . CRF (chronic renal failure) 01/15/2012    Past Surgical History:  Procedure Laterality Date  . ATRIAL FLUTTER ABLATION N/A 11/09/2012   Procedure: ATRIAL FLUTTER ABLATION;  Surgeon: [name], MD;  Location: [facility] CATH LAB;  Service: Cardiovascular;  Laterality: N/A;  . AV FISTULA PLACEMENT  2011   left forearm  . AV FISTULA PLACEMENT    . CARDIAC CATHETERIZATION  03/16/12  . CORONARY ARTERY BYPASS GRAFT  03/19/2012   Procedure: CORONARY ARTERY BYPASS GRAFTING (CABG);  Surgeon: Rexene Alberts, MD;  Location: [facility];  Service: Open Heart Surgery;  Laterality: N/A;  (B) MAMMARY  . CYSTOSCOPY WITH BIOPSY N/A 08/15/2016  Procedure: CYSTOSCOPY WITH IDENTIFICATION OF RIGHT TRANSPLANTATION OF URETRAL ORFICE WITH FLUORESCEIN;  Surgeon: Carolan Clines, MD;  Location: [facility] ORS;  Service: Urology;  Laterality: N/A;  . DENTAL SURGERY  2008   multiple  . [place]   left  . FRACTURE SURGERY    . HERNIA REPAIR  [id number]   umbilical  . HOLMIUM LASER APPLICATION N/A [id number]   Procedure: HOLMIUM LASER APPLICATION WITH 3 CM RIGHT BLADDER STONE;  Surgeon: Carolan Clines, MD;  Location: [facility] ORS;  Service: Urology;  Laterality: N/A;  . INGUINAL HERNIA REPAIR  09/2009   bilaterally  . KIDNEY TRANSPLANT    . LEFT HEART  CATHETERIZATION WITH CORONARY ANGIOGRAM N/A 03/16/2012   Procedure: LEFT HEART CATHETERIZATION WITH CORONARY ANGIOGRAM;  Surgeon: Sherren Mocha, MD;  Location: [facility] CATH LAB;  Service: Cardiovascular;  Laterality: N/A;  . RENAL ARTERY STENT  2001  . TRANSURETHRAL RESECTION OF BLADDER TUMOR N/A 08/15/2016   Procedure: TRANSURETHRAL BIOPSY OF RIGHT BLADDER WALL;  Surgeon: Carolan Clines, MD;  Location: [facility] ORS;  Service: Urology;  Laterality: N/A;       Home Medications    Prior to Admission medications   Medication Sig Start Date End Date Taking? Authorizing Provider  allopurinol (ZYLOPRIM) 100 MG tablet Take 200 mg by mouth every morning.    Yes Historical Provider, MD  aspirin 81 MG EC tablet Take 1 tablet (81 mg total) by mouth daily. Patient taking differently: Take 81 mg by mouth every evening.  10/28/12  Yes Rogelia Mire, NP  CIALIS 5 MG tablet Take 5 mg by mouth daily as needed for erectile dysfunction.  12/12/15  Yes Historical Provider, MD  cloNIDine (CATAPRES) 0.2 MG tablet Take 0.2 mg by mouth every evening.    Yes Historical Provider, MD  fenofibrate micronized (LOFIBRA) 134 MG capsule Take 134 mg by mouth daily.    Yes Historical Provider, MD  labetalol (NORMODYNE) 200 MG tablet Take 1 tablet (200 mg total) by mouth 2 (two) times daily. 07/11/16  Yes [name], MD  Magnesium 400 MG CAPS Take 400 mg by mouth daily.    Yes Historical Provider, MD  metoprolol (LOPRESSOR) 50 MG tablet Take 50-100 mg by mouth daily as needed (AFIB).   Yes Historical Provider, MD  mycophenolate (MYFORTIC) 180 MG EC tablet Take 720 mg by mouth 2 (two) times daily.    Yes Historical Provider, MD  omega-3 acid ethyl esters (LOVAZA) 1 G capsule Take 2 g by mouth 2 (two) times daily.   Yes Historical Provider, MD  omeprazole (PRILOSEC) 20 MG capsule Take 20 mg by mouth daily. 12/12/15  Yes Historical Provider, MD  prednisoLONE 5 MG TABS tablet Take 5 mg by mouth at bedtime.    Yes Historical  Provider, MD  simvastatin (ZOCOR) 40 MG tablet Take 40 mg by mouth at bedtime.    Yes Historical Provider, MD  sulfamethoxazole-trimethoprim (BACTRIM DS) 800-160 MG per tablet Take 1 tablet by mouth 3 (three) times a week. Take 1 tab by mouth 3 times a week on Mon, Wed and Fri.   Yes Historical Provider, MD  tacrolimus (PROGRAF) 1 MG capsule Take 1 mg by mouth 2 (two) times daily. 12/12/15  Yes Historical Provider, MD  Vitamin D, Ergocalciferol, (DRISDOL) 50000 units CAPS capsule Take 50,000 Units by mouth 2 (two) times a week. On Tuesdays and Saturdays 06/11/16  Yes Historical Provider, MD    Family History Family History  Problem Relation Age of Onset  .  Adopted: Yes  . Family history unknown: Yes    Social History Social History  Substance Use Topics  . Smoking status: Former Smoker    Packs/day: 0.30    Years: 30.00    Types: Cigarettes    Quit date: 03/15/2012  . Smokeless tobacco: Never Used  . Alcohol use Yes     Comment: 04/13/12 "2 mixed drinks/month"     Allergies   Contrast media [iodinated diagnostic agents] and Penicillins   Review of Systems Review of Systems A complete 10 system review of systems was obtained and all systems are negative except as noted in the HPI and PMH.    Physical Exam Updated Vital Signs BP 154/79   Pulse 110   Temp 97.5 F (36.4 C) (Oral)   Resp 20   Ht 5\' 8"  (1.727 m)   Wt 85.3 kg   SpO2 98%   BMI 28.59 kg/m   Physical Exam  Constitutional: He is oriented to person, place, and time. He appears well-developed and well-nourished. No distress.  Nontoxic and in NAD  HENT:  Head: Normocephalic and atraumatic.  Eyes: Conjunctivae and EOM are normal. No scleral icterus.  Neck: Normal range of motion.  Cardiovascular: Intact distal pulses.  An irregularly irregular rhythm present. Tachycardia present.   Pulmonary/Chest: Effort normal. No respiratory distress. He has no wheezes. He has no rales.  Lungs grossly clear. No wheezing or  rales. No JVD.  Musculoskeletal: Normal range of motion.  Neurological: He is alert and oriented to person, place, and time. He exhibits normal muscle tone. Coordination normal.  GCS 15. Patient moving all extremities.  Skin: Skin is warm and dry. No rash noted. He is not diaphoretic. No erythema. No pallor.  Psychiatric: He has a normal mood and affect. His behavior is normal.  Nursing note and vitals reviewed.    ED Treatments / Results   DIAGNOSTIC STUDIES: Oxygen Saturation is 97% on RA, adequate by my interpretation.   COORDINATION OF CARE: 11:26 PM - Discussed next steps with pt. Pt verbalized understanding and is agreeable with the plan.    Labs (all labs ordered are listed, but only abnormal results are displayed) Labs Reviewed  CBC WITH DIFFERENTIAL/PLATELET - Abnormal; Notable for the following:       Result Value   RBC 6.58 (*)    Hemoglobin 17.6 (*)    HCT 53.7 (*)    RDW 15.9 (*)    Monocytes Absolute 1.1 (*)    All other components within normal limits  BASIC METABOLIC PANEL - Abnormal; Notable for the following:    Glucose, Bld 129 (*)    BUN 26 (*)    Creatinine, Ser 1.49 (*)    Calcium 10.6 (*)    GFR calc non Af Amer 52 (*)    All other components within normal limits  I-STAT TROPOININ, ED    EKG  EKG Interpretation  Date/Time:  Saturday November 30 2016 23:05:58 EST Ventricular Rate:  147 PR Interval:    QRS Duration: 120 QT Interval:  335 QTC Calculation: 524 R Axis:   -52 Text Interpretation:  Atrial fibrillation LVH with IVCD, LAD and secondary repol abnrm Probable anterolateral infarct, acute Prolonged QT interval agree. QRS and t morphology unchanged from previous Confirmed by Johnney Killian, MD, Jeannie Done [phone]) on 11/30/2016 11:14:11 PM       ED ECG REPORT   Date: 12/01/2016  Rate: 75  Rhythm: normal sinus rhythm and supraventricular bigeminy  QRS Axis: normal  Intervals: normal  ST/T Wave abnormalities: nonspecific ST changes   Conduction Disutrbances:nonspecific intraventricular conduction delay  Narrative Interpretation: Sinus rhythm with IVCD and supraventricular bigeminy; consider RAE  Old EKG Reviewed: Old reviewed; No atrial fibrillation with RVR as previously seen  I have personally reviewed the EKG tracing and agree with the computerized printout as noted.   Radiology Dg Chest 2 View  Result Date: 11/30/2016 CLINICAL DATA:  Mild-to-moderate chest pain with sudden onset 6 hours ago. History of atrial fibrillation. EXAM: CHEST  2 VIEW COMPARISON:  CXR 05/18/2015 FINDINGS: Heart is borderline enlarged. The patient is status post median sternotomy. No acute pulmonary consolidation, CHF nor effusion. No pneumothorax. No suspicious osseous abnormality. IMPRESSION: No active cardiopulmonary disease.  No significant change Electronically Signed   By: Ashley Royalty M.D.   On: 11/30/2016 23:42    Procedures Procedures (including critical care time)  Medications Ordered in ED Medications  diltiazem (CARDIZEM) 1 mg/mL load via infusion 15 mg (15 mg Intravenous Bolus from Bag 11/30/16 2353)    And  diltiazem (CARDIZEM) 100 mg in dextrose 5% 148mL (1 mg/mL) infusion (0 mg/hr Intravenous Stopped 12/01/16 0118)     Initial Impression / Assessment and Plan / ED Course  I have reviewed the triage vital signs and the nursing notes.  Pertinent labs & imaging results that were available during my care of the patient were reviewed by me and considered in my medical decision making (see chart for details).  Clinical Course     Patient with a history of paroxysmal atrial fibrillation presents to the emergency department for evaluation of chest tightness. He was found to be in A. fib with RVR on arrival. Symptoms resolved with Cardizem and patient converted to normal sinus rhythm. On repeat evaluation, patient denied chest pain or tightness. Troponin reassuring and remainder of labs at baseline. Chest x-ray shows no  significant cardiopulmonary abnormality.  Patient is closely followed by cardiology and he is reliable for follow-up. He states that he is feeling better and is comfortable with discharge and outpatient management. He has been seen in the past for paroxysmal atrial fibrillation and discharged without complications. I believe he is safe for this management, again, today. Return precautions provided at discharge. Patient discharged in satisfactory condition with no unaddressed concerns.   Final Clinical Impressions(s) / ED Diagnoses   Final diagnoses:  Paroxysmal atrial fibrillation [facility])    New Prescriptions Discharge Medication List as of 12/01/2016  2:33 AM      I personally performed the services described in this documentation, which was scribed in my presence. The recorded information has been reviewed and is accurate.       Antonietta Breach, PA-C 12/01/16 [id number]    Everlene Balls, MD 12/01/16 1357

## 2016-11-30 NOTE — ED Triage Notes (Signed)
PT C/O CHEST PAIN AND SOB SINCE 5:30 PM TODAY WHILE EATING. PT STS HE HAS A HX OF AFIB, AND BELIEVES THERE IS A FOOD TRIGGERING HIS EPISODES. HE STS HE TOOK METOPROLOL 50MG  AT 5:30 PM, HE THEN TOOK ANOTHER 100MG  AT 9:30 PM, BUT DID NOT FEEL ANY BETTER. DENIES N/V, BUT HAS A HEADACHE.

## 2016-12-01 LAB — BASIC METABOLIC PANEL
ANION GAP: 8 (ref 5–15)
BUN: 26 mg/dL — AB (ref 6–20)
CHLORIDE: 103 mmol/L (ref 101–111)
CO2: 27 mmol/L (ref 22–32)
Calcium: 10.6 mg/dL — ABNORMAL HIGH (ref 8.9–10.3)
Creatinine, Ser: 1.49 mg/dL — ABNORMAL HIGH (ref 0.61–1.24)
GFR calc non Af Amer: 52 mL/min — ABNORMAL LOW (ref >60)
Glucose, Bld: 129 mg/dL — ABNORMAL HIGH (ref 65–99)
POTASSIUM: 4.2 mmol/L (ref 3.5–5.1)
SODIUM: 138 mmol/L (ref 135–145)

## 2016-12-03 ENCOUNTER — Telehealth (HOSPITAL_COMMUNITY): Payer: Self-pay | Admitting: *Deleted

## 2016-12-03 DIAGNOSIS — N5201 Erectile dysfunction due to arterial insufficiency: Secondary | ICD-10-CM | POA: Diagnosis not present

## 2016-12-03 NOTE — Telephone Encounter (Signed)
LMOM for pt to clbk to sched f/u appt.

## 2016-12-04 MED FILL — CIALIS 5 MG TABLET: 5 | 30 days supply | Qty: 30 | Fill #0

## 2016-12-05 ENCOUNTER — Telehealth (HOSPITAL_COMMUNITY): Payer: Self-pay | Admitting: *Deleted

## 2016-12-05 MED FILL — VIT D2 1.25 MG (50,000 UNIT: 1.25 MG | 13 days supply | Qty: 4 | Fill #7

## 2016-12-05 MED FILL — MYCOPHENOLIC ACID DR 180 MG: 180 | 30 days supply | Qty: 240 | Fill #3

## 2016-12-05 MED FILL — predniSONE 5 MG TABS: 5 | 90 days supply | Qty: 90 | Fill #1

## 2016-12-05 NOTE — Telephone Encounter (Signed)
Pt returned call regarding follow up.  Pt does not feel he needs to be followed up at this time.  He monitors his HR and BP at home and feels like his meds and sxs are stable.  Pt stated that he had appt coming up with Dr. Johnsie Cancel the end of January.  I let pt know this was recall status and to please call their office to actually get it scheduled.  Pt understood and thanked me for the initial call.  He will follow up with his primary Cards

## 2016-12-12 MED FILL — OMEPRAZOLE DR 20 MG CAPSULE: 20 | 90 days supply | Qty: 90 | Fill #1

## 2016-12-12 MED FILL — ALLOPURINOL 100 MG TABLET: 100 | 30 days supply | Qty: 60 | Fill #2

## 2016-12-19 MED FILL — FENOFIBRATE 134 MG CAPSULE: 134 | 30 days supply | Qty: 30 | Fill #4

## 2016-12-19 MED FILL — SULFAMETHOXAZOLE/TMP SS TAB: 400-80 | 28 days supply | Qty: 12 | Fill #1

## 2016-12-25 MED FILL — SIMVASTATIN 40 MG TABLET: 40 | 90 days supply | Qty: 90 | Fill #0

## 2016-12-26 MED FILL — TACROLIMUS 1 MG CAPSULE: 1 | 30 days supply | Qty: 60 | Fill #2

## 2016-12-26 MED FILL — VIT D2 1.25 MG (50,000 UNIT: 1.25 MG | 35 days supply | Qty: 10 | Fill #0

## 2016-12-31 NOTE — Progress Notes (Signed)
Patient ID: [name], male   DOB: March 06, 1961, 56 y.o.   MRN: [id number]   56 y.o. f/u CAD and Flutter. Post renal transplant. Had CABG on 4/13 Subsequent fluter ablation by Dr Caryl Comes. EF normal by echo 11/13 60-65% Nonischemic post CABG myovue  12/13. 03/23/13 had renal transplant from son to right pelvis Baseline Cr 1.4 to 1.6 Still on Prograf and myfortic with tapering prednisone Had a lot of side effects from steroids with weight gain and irritability. No cardiac issues. Mild dependant edema. No palpitations or chest pain BP low but does not want to lower nightly clonidine  Had frequent PAC;s in office that were asymptomatic   Echo 08/26/2104  still with severe LVH and LAE.  Mild MR EF normal.  Discussed risk of recurrent afib.  He has occasional bad days of irregular beats ? 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  Last long episode of palpitations was at son's wedding In January. Also has ambient PVCls so pulse is usually irregular.    Seen by Dr Caryl Comes  Not interested in genetic HOCM testing and he is adopted.  Positive sleep study not interested in CPAP Uses Alive Cor and no PAF noted   Still with some palpitations and lightheadedness when changing positions Lost 40 lbs last year and biking again  Saw Dr Lorrene Reid and his LUE fistual is not very large and she did not think it Would benefit him to tie it off    ROS: Denies fever, malais, weight loss, blurry vision, decreased visual acuity, cough, sputum, SOB, hemoptysis, pleuritic pain, palpitaitons, heartburn, abdominal pain, melena, lower extremity edema, claudication, or rash.  All other systems reviewed and negative  General: Affect appropriate Cushingoid  Overweight white male  HEENT: normal Neck supple with no adenopathy JVP normal  Right  bruits no thyromegaly Lungs clear with no  wheezing and good diaphragmatic motion Heart:  S1/S2 SEM  murmur, no rub, gallop or click PMI normal Abdomen: benighn, BS positve, no tenderness, no AAA transplant in right pelvic fossa no bruit.  No HSM or HJR Distal pulses intact with fistula LEU wrist two aneurysmal segments  Plus one bilateral edema Neuro non-focal Skin warm and dry No muscular weakness   Current Outpatient Prescriptions  Medication Sig Dispense Refill  . allopurinol (ZYLOPRIM) 100 MG tablet Take 200 mg by mouth every morning.     Marland Kitchen aspirin 81 MG EC tablet Take 1 tablet (81 mg total) by mouth daily. 30 tablet   . CIALIS 5 MG tablet Take 5 mg by mouth daily as needed for erectile dysfunction.   11  . cloNIDine (CATAPRES) 0.2 MG tablet Take 0.2 mg by mouth every evening.     . fenofibrate micronized (LOFIBRA) 134 MG capsule Take 134 mg by mouth daily.     Marland Kitchen labetalol (NORMODYNE) 200 MG tablet Take 1 tablet (200 mg total) by mouth 2 (two) times daily. 180 tablet 1  . Magnesium 400 MG CAPS Take 400 mg by mouth daily.     . metoprolol (LOPRESSOR) 50 MG tablet Take 50-100 mg by mouth daily as needed (AFIB).    Marland Kitchen mycophenolate (MYFORTIC) 180 MG EC tablet Take 720 mg by mouth 2 (two) times daily.     Marland Kitchen omega-3 acid ethyl esters (LOVAZA) 1 G capsule Take 2  g by mouth 2 (two) times daily.    Marland Kitchen omeprazole (PRILOSEC) 20 MG capsule Take 20 mg by mouth daily.  5  . prednisoLONE 5 MG TABS tablet Take 5 mg by mouth at bedtime.     . simvastatin (ZOCOR) 40 MG tablet Take 40 mg by mouth at bedtime.     . sulfamethoxazole-trimethoprim (BACTRIM DS) 800-160 MG per tablet Take 1 tablet by mouth 3 (three) times a week. Take 1 tab by mouth 3 times a week on Mon, Wed and Fri.    . tacrolimus (PROGRAF) 1 MG capsule Take 1 mg by mouth 2 (two) times daily.  5  . Vitamin D, Ergocalciferol, (DRISDOL) 50000 units CAPS capsule Take 50,000 Units by mouth 2 (two) times a week. On Tuesdays and Saturdays  5  . diltiazem (CARDIZEM) 60 MG tablet Take 1  tablet (60 mg total) by mouth as needed (for palpitations). 30 tablet 6   No current facility-administered medications for this visit.     Allergies  Contrast media [iodinated diagnostic agents] and Penicillins  Electrocardiogram:  02/16/14  SR PAC;s LAD LVH  02/22/15  SR rate 64  LAD LVH  PAC;s   07/24/16  SR LVH LAD frequent PACls PVC;s couplets   Assessment and Plan CAD/CABG:  2013 no angina continue ASA and statin  Flutter/Palpitations:  NSR no palpitations better with weight loss has Alive Cor on phone Still Concerned at the frequency of PAC;s and PVCls Seen by Dr Caryl Comes and no further Rx indicated Discussed with patient He wants to try PRN cardizem Interaction with zocor Noted but he will only be taking PRN if it helps with palpitations will have to change zocor Or lower dose  HOCM:  LVH life long HTN  No interest in genetic testing can't have MRI with gadolinium due to CRF / transplant Renal Transplant still dependant on prednisone continue prograf f/u Dr Lorrene Reid Erythrocytosis:  Chronic Hb 16.9 defer to renal need for phlebotomy indicates last one was 2 months ago With CNS symptoms when he has them  Only smoking a cigar on weekends previous smoker  Bruit:  Right f/u carotid duplex 12/28/15 plaque no stenosis repeat 12/2017 Chol:  HDL only 24 LDL fine  No indication for niaspan as LDL fine and best way to increase HDL is with exercise discussed with patient    Jenkins Rouge

## 2017-01-02 MED FILL — MYCOPHENOLIC ACID DR 180 MG: 180 | 30 days supply | Qty: 240 | Fill #4

## 2017-01-08 ENCOUNTER — Encounter: Payer: Self-pay | Admitting: Cardiovascular Disease

## 2017-01-08 ENCOUNTER — Ambulatory Visit (INDEPENDENT_AMBULATORY_CARE_PROVIDER_SITE_OTHER): Payer: 59 | Admitting: Cardiovascular Disease

## 2017-01-08 VITALS — BP 124/80 | HR 59 | Ht 67.0 in | Wt 193.8 lb

## 2017-01-08 DIAGNOSIS — I25700 Atherosclerosis of coronary artery bypass graft(s), unspecified, with unstable angina pectoris: Secondary | ICD-10-CM

## 2017-01-08 MED ORDER — DILTIAZEM HCL 60 MG PO TABS: 60.0000 mg | ORAL_TABLET | ORAL | 6 refills | Status: DC | PRN

## 2017-01-08 MED FILL — dilTIAZem HCL 60 MG TABS: 60 | 30 days supply | Qty: 30 | Fill #0

## 2017-01-08 NOTE — Patient Instructions (Addendum)
Medication Instructions:  1-Cardizem 60 mg by mouth daily as needed for palpitations  Labwork: NONE  Testing/Procedures: NONE  Follow-Up: Your physician wants you to follow-up in: 6 months with Dr. Johnsie Cancel. You will receive a reminder letter in the mail two months in advance. If you don't receive a letter, please call our office to schedule the follow-up appointment.   If you need a refill on your cardiac medications before your next appointment, please call your pharmacy.

## 2017-01-09 ENCOUNTER — Telehealth: Payer: Self-pay | Admitting: Cardiovascular Disease

## 2017-01-09 MED FILL — ALLOPURINOL 100 MG TABLET: 100 | 30 days supply | Qty: 60 | Fill #3

## 2017-01-09 MED FILL — CIALIS 5 MG TABLET: 5 | 30 days supply | Qty: 30 | Fill #1

## 2017-01-09 MED FILL — cloNIDine HCL 0.2 MG TABS: 0.2 | 90 days supply | Qty: 90 | Fill #1

## 2017-01-09 MED FILL — OMEGA-3 ETHYL ESTERS 1 GM C: 1 | 90 days supply | Qty: 360 | Fill #3

## 2017-01-09 NOTE — Telephone Encounter (Signed)
New Message     There is a drug interaction between these 2 medications:    diltiazem (CARDIZEM) 60 MG tablet Take 1 tablet (60 mg total) by mouth as needed (for palpitations).     tacrolimus (PROGRAF) 1 MG capsule Take 1 mg by mouth 2 (two) times daily.    They would like to speak to you about the follow up on the drug interaction, before the pt picks up medicines.

## 2017-01-09 NOTE — Telephone Encounter (Signed)
Only taking PRN

## 2017-01-09 NOTE — Telephone Encounter (Signed)
Otho Bellows the pharmacist at [facility] and informed her of Dr. Kyla Balzarine response. Manuela Schwartz took note of response and will fill patient's diltiazem.

## 2017-01-09 NOTE — Telephone Encounter (Signed)
[facility] pharmacy calling about drug interaction between diltiazem & tacrolimus and diltiazem & clonidine. Informed pharmacist that patient should only be taking diltiazem as needed for palpitations. Pharmacist wanted to make sure with Dr. Johnsie Cancel before filling. Will send to Dr. Johnsie Cancel for advisement.

## 2017-01-14 MED FILL — LABETALOL HCL 200 MG TABLET: 200 | 30 days supply | Qty: 60 | Fill #0

## 2017-01-16 MED FILL — SULFAMETHOXAZOLE/TMP SS TAB: 400-80 | 28 days supply | Qty: 12 | Fill #2

## 2017-01-23 MED FILL — TACROLIMUS 1 MG CAPSULE: 1 | 30 days supply | Qty: 60 | Fill #3

## 2017-01-23 MED FILL — VIT D2 1.25 MG (50,000 UNIT: 1.25 MG | 35 days supply | Qty: 10 | Fill #1

## 2017-01-23 MED FILL — FENOFIBRATE 134 MG CAPSULE: 134 | 30 days supply | Qty: 30 | Fill #5

## 2017-01-28 DIAGNOSIS — Z940 Kidney transplant status: Secondary | ICD-10-CM | POA: Diagnosis not present

## 2017-01-28 DIAGNOSIS — I129 Hypertensive chronic kidney disease with stage 1 through stage 4 chronic kidney disease, or unspecified chronic kidney disease: Secondary | ICD-10-CM | POA: Diagnosis not present

## 2017-01-30 DIAGNOSIS — D750 Familial erythrocytosis: Secondary | ICD-10-CM | POA: Diagnosis not present

## 2017-01-30 DIAGNOSIS — I421 Obstructive hypertrophic cardiomyopathy: Secondary | ICD-10-CM | POA: Diagnosis not present

## 2017-01-30 DIAGNOSIS — R739 Hyperglycemia, unspecified: Secondary | ICD-10-CM | POA: Diagnosis not present

## 2017-01-30 DIAGNOSIS — E785 Hyperlipidemia, unspecified: Secondary | ICD-10-CM | POA: Diagnosis not present

## 2017-01-30 DIAGNOSIS — N2581 Secondary hyperparathyroidism of renal origin: Secondary | ICD-10-CM | POA: Diagnosis not present

## 2017-01-30 DIAGNOSIS — Z940 Kidney transplant status: Secondary | ICD-10-CM | POA: Diagnosis not present

## 2017-01-30 DIAGNOSIS — I770 Arteriovenous fistula, acquired: Secondary | ICD-10-CM | POA: Diagnosis not present

## 2017-01-30 DIAGNOSIS — I2510 Atherosclerotic heart disease of native coronary artery without angina pectoris: Secondary | ICD-10-CM | POA: Diagnosis not present

## 2017-01-30 DIAGNOSIS — I129 Hypertensive chronic kidney disease with stage 1 through stage 4 chronic kidney disease, or unspecified chronic kidney disease: Secondary | ICD-10-CM | POA: Diagnosis not present

## 2017-01-30 MED FILL — MYCOPHENOLIC ACID DR 180 MG: 180 | 30 days supply | Qty: 240 | Fill #5

## 2017-02-10 MED FILL — LABETALOL HCL 200 MG TABLET: 200 | 30 days supply | Qty: 60 | Fill #1

## 2017-02-10 MED FILL — SULFAMETHOXAZOLE/TMP SS TAB: 400-80 | 28 days supply | Qty: 12 | Fill #3

## 2017-02-10 MED FILL — ALLOPURINOL 100 MG TABLET: 100 | 30 days supply | Qty: 60 | Fill #4

## 2017-02-11 DIAGNOSIS — N210 Calculus in bladder: Secondary | ICD-10-CM | POA: Diagnosis not present

## 2017-02-11 DIAGNOSIS — N5201 Erectile dysfunction due to arterial insufficiency: Secondary | ICD-10-CM | POA: Diagnosis not present

## 2017-02-11 DIAGNOSIS — N189 Chronic kidney disease, unspecified: Secondary | ICD-10-CM | POA: Diagnosis not present

## 2017-02-11 DIAGNOSIS — Z940 Kidney transplant status: Secondary | ICD-10-CM | POA: Diagnosis not present

## 2017-02-12 DIAGNOSIS — Z86018 Personal history of other benign neoplasm: Secondary | ICD-10-CM | POA: Diagnosis not present

## 2017-02-12 DIAGNOSIS — L821 Other seborrheic keratosis: Secondary | ICD-10-CM | POA: Diagnosis not present

## 2017-02-12 DIAGNOSIS — D1801 Hemangioma of skin and subcutaneous tissue: Secondary | ICD-10-CM | POA: Diagnosis not present

## 2017-02-12 DIAGNOSIS — D225 Melanocytic nevi of trunk: Secondary | ICD-10-CM | POA: Diagnosis not present

## 2017-02-12 DIAGNOSIS — L814 Other melanin hyperpigmentation: Secondary | ICD-10-CM | POA: Diagnosis not present

## 2017-02-16 ENCOUNTER — Inpatient Hospital Stay (HOSPITAL_COMMUNITY)
Admission: EM | Admit: 2017-02-16 | Discharge: 2017-02-19 | DRG: 194 | Disposition: A | Discharge status: Home / Self Care | Payer: 59 | Attending: Internal Medicine | Admitting: Internal Medicine

## 2017-02-16 ENCOUNTER — Observation Stay (HOSPITAL_COMMUNITY): Payer: 59

## 2017-02-16 ENCOUNTER — Encounter (HOSPITAL_COMMUNITY): Payer: Self-pay | Admitting: Emergency Medicine

## 2017-02-16 ENCOUNTER — Emergency Department (HOSPITAL_COMMUNITY): Payer: 59

## 2017-02-16 DIAGNOSIS — D696 Thrombocytopenia, unspecified: Secondary | ICD-10-CM | POA: Diagnosis present

## 2017-02-16 DIAGNOSIS — I4892 Unspecified atrial flutter: Secondary | ICD-10-CM | POA: Diagnosis present

## 2017-02-16 DIAGNOSIS — J101 Influenza due to other identified influenza virus with other respiratory manifestations: Principal | ICD-10-CM | POA: Diagnosis present

## 2017-02-16 DIAGNOSIS — Z91041 Radiographic dye allergy status: Secondary | ICD-10-CM

## 2017-02-16 DIAGNOSIS — I480 Paroxysmal atrial fibrillation: Secondary | ICD-10-CM | POA: Diagnosis not present

## 2017-02-16 DIAGNOSIS — I119 Hypertensive heart disease without heart failure: Secondary | ICD-10-CM | POA: Diagnosis present

## 2017-02-16 DIAGNOSIS — I4891 Unspecified atrial fibrillation: Secondary | ICD-10-CM

## 2017-02-16 DIAGNOSIS — I120 Hypertensive chronic kidney disease with stage 5 chronic kidney disease or end stage renal disease: Secondary | ICD-10-CM | POA: Diagnosis not present

## 2017-02-16 DIAGNOSIS — D849 Immunodeficiency, unspecified: Secondary | ICD-10-CM | POA: Diagnosis present

## 2017-02-16 DIAGNOSIS — R059 Cough, unspecified: Secondary | ICD-10-CM

## 2017-02-16 DIAGNOSIS — Z87891 Personal history of nicotine dependence: Secondary | ICD-10-CM

## 2017-02-16 DIAGNOSIS — Z7982 Long term (current) use of aspirin: Secondary | ICD-10-CM

## 2017-02-16 DIAGNOSIS — N3000 Acute cystitis without hematuria: Secondary | ICD-10-CM | POA: Diagnosis not present

## 2017-02-16 DIAGNOSIS — R05 Cough: Secondary | ICD-10-CM

## 2017-02-16 DIAGNOSIS — Z940 Kidney transplant status: Secondary | ICD-10-CM | POA: Diagnosis not present

## 2017-02-16 DIAGNOSIS — N185 Chronic kidney disease, stage 5: Secondary | ICD-10-CM | POA: Diagnosis present

## 2017-02-16 DIAGNOSIS — I10 Essential (primary) hypertension: Secondary | ICD-10-CM

## 2017-02-16 DIAGNOSIS — Z880 Allergy status to penicillin: Secondary | ICD-10-CM

## 2017-02-16 DIAGNOSIS — N183 Chronic kidney disease, stage 3 unspecified: Secondary | ICD-10-CM | POA: Diagnosis present

## 2017-02-16 DIAGNOSIS — D899 Disorder involving the immune mechanism, unspecified: Secondary | ICD-10-CM

## 2017-02-16 DIAGNOSIS — N210 Calculus in bladder: Secondary | ICD-10-CM | POA: Diagnosis present

## 2017-02-16 DIAGNOSIS — I421 Obstructive hypertrophic cardiomyopathy: Secondary | ICD-10-CM | POA: Diagnosis present

## 2017-02-16 DIAGNOSIS — Z951 Presence of aortocoronary bypass graft: Secondary | ICD-10-CM

## 2017-02-16 DIAGNOSIS — I150 Renovascular hypertension: Secondary | ICD-10-CM

## 2017-02-16 DIAGNOSIS — R109 Unspecified abdominal pain: Secondary | ICD-10-CM | POA: Diagnosis not present

## 2017-02-16 DIAGNOSIS — R6510 Systemic inflammatory response syndrome (SIRS) of non-infectious origin without acute organ dysfunction: Secondary | ICD-10-CM | POA: Diagnosis not present

## 2017-02-16 DIAGNOSIS — Z79899 Other long term (current) drug therapy: Secondary | ICD-10-CM

## 2017-02-16 DIAGNOSIS — I214 Non-ST elevation (NSTEMI) myocardial infarction: Secondary | ICD-10-CM

## 2017-02-16 DIAGNOSIS — N390 Urinary tract infection, site not specified: Secondary | ICD-10-CM | POA: Diagnosis present

## 2017-02-16 LAB — BASIC METABOLIC PANEL
Anion gap: 9 (ref 5–15)
BUN: 18 mg/dL (ref 6–20)
CO2: 21 mmol/L — ABNORMAL LOW (ref 22–32)
Calcium: 8.7 mg/dL — ABNORMAL LOW (ref 8.9–10.3)
Chloride: 102 mmol/L (ref 101–111)
Creatinine, Ser: 1.63 mg/dL — ABNORMAL HIGH (ref 0.61–1.24)
GFR calc Af Amer: 54 mL/min — ABNORMAL LOW (ref >60)
GFR calc non Af Amer: 47 mL/min — ABNORMAL LOW (ref >60)
Glucose, Bld: 116 mg/dL — ABNORMAL HIGH (ref 65–99)
Potassium: 3.8 mmol/L (ref 3.5–5.1)
Sodium: 132 mmol/L — ABNORMAL LOW (ref 135–145)

## 2017-02-16 LAB — CBC WITH DIFFERENTIAL/PLATELET
Basophils Absolute: 0 10*3/uL (ref 0.0–0.1)
Basophils Relative: 0 %
Eosinophils Absolute: 0 10*3/uL (ref 0.0–0.7)
Eosinophils Relative: 0 %
HCT: 47.7 % (ref 39.0–52.0)
Hemoglobin: 15.8 g/dL (ref 13.0–17.0)
Lymphocytes Relative: 4 %
Lymphs Abs: 0.7 10*3/uL (ref 0.7–4.0)
MCH: 26.9 pg (ref 26.0–34.0)
MCHC: 33.1 g/dL (ref 30.0–36.0)
MCV: 81.1 fL (ref 78.0–100.0)
Monocytes Absolute: 1.7 10*3/uL — ABNORMAL HIGH (ref 0.1–1.0)
Monocytes Relative: 10 %
Neutro Abs: 14.8 10*3/uL — ABNORMAL HIGH (ref 1.7–7.7)
Neutrophils Relative %: 86 %
Platelets: 137 10*3/uL — ABNORMAL LOW (ref 150–400)
RBC: 5.88 MIL/uL — ABNORMAL HIGH (ref 4.22–5.81)
RDW: 15.2 % (ref 11.5–15.5)
WBC: 17.1 10*3/uL — ABNORMAL HIGH (ref 4.0–10.5)

## 2017-02-16 LAB — URINALYSIS, ROUTINE W REFLEX MICROSCOPIC
Bilirubin Urine: NEGATIVE
GLUCOSE, UA: NEGATIVE mg/dL
Ketones, ur: NEGATIVE mg/dL
NITRITE: NEGATIVE
PH: 5 (ref 5.0–8.0)
PROTEIN: 100 mg/dL — AB
SPECIFIC GRAVITY, URINE: 1.028 (ref 1.005–1.030)
Squamous Epithelial / LPF: NONE SEEN

## 2017-02-16 LAB — INFLUENZA PANEL BY PCR (TYPE A & B)
INFLAPCR: NEGATIVE
Influenza B By PCR: POSITIVE — AB

## 2017-02-16 LAB — LACTIC ACID, PLASMA: Lactic Acid, Venous: 1 mmol/L (ref 0.5–1.9)

## 2017-02-16 LAB — SEDIMENTATION RATE: Sed Rate: 13 mm/hr (ref 0–16)

## 2017-02-16 LAB — PROCALCITONIN: Procalcitonin: 0.14 ng/mL

## 2017-02-16 MED ORDER — ONDANSETRON HCL 4 MG/2ML IJ SOLN: 4.0000 mg | Freq: Four times a day (QID) | INTRAMUSCULAR | Status: DC | PRN

## 2017-02-16 MED ORDER — DILTIAZEM HCL 60 MG PO TABS
60.0000 mg | ORAL_TABLET | ORAL | Status: DC | PRN
  Filled 2017-02-16: qty 1

## 2017-02-16 MED ORDER — FENOFIBRATE 54 MG PO TABS
54.0000 mg | ORAL_TABLET | Freq: Every day | ORAL | Status: DC
  Administered 2017-02-17 – 2017-02-19 (×3): 54 mg via ORAL
  Filled 2017-02-16 (×3): qty 1

## 2017-02-16 MED ORDER — DEXTROSE 5 % IV SOLN
1.0000 g | Freq: Once | INTRAVENOUS | Status: AC
  Administered 2017-02-16: 1 g via INTRAVENOUS
  Filled 2017-02-16: qty 10

## 2017-02-16 MED ORDER — ONDANSETRON HCL 4 MG PO TABS: 4.0000 mg | ORAL_TABLET | Freq: Four times a day (QID) | ORAL | Status: DC | PRN

## 2017-02-16 MED ORDER — ASPIRIN EC 81 MG PO TBEC
81.0000 mg | DELAYED_RELEASE_TABLET | Freq: Every day | ORAL | Status: DC
  Administered 2017-02-16: 81 mg via ORAL
  Filled 2017-02-16 (×3): qty 1

## 2017-02-16 MED ORDER — LABETALOL HCL 200 MG PO TABS
200.0000 mg | ORAL_TABLET | Freq: Two times a day (BID) | ORAL | Status: DC
  Administered 2017-02-16 – 2017-02-18 (×5): 200 mg via ORAL
  Filled 2017-02-16 (×5): qty 1

## 2017-02-16 MED ORDER — ENOXAPARIN SODIUM 40 MG/0.4ML ~~LOC~~ SOLN
40.0000 mg | SUBCUTANEOUS | Status: DC
  Administered 2017-02-16 – 2017-02-18 (×3): 40 mg via SUBCUTANEOUS
  Filled 2017-02-16 (×3): qty 0.4

## 2017-02-16 MED ORDER — VITAMIN D (ERGOCALCIFEROL) 1.25 MG (50000 UNIT) PO CAPS
50000.0000 [IU] | ORAL_CAPSULE | ORAL | Status: DC
  Administered 2017-02-17: 50000 [IU] via ORAL
  Filled 2017-02-16: qty 1

## 2017-02-16 MED ORDER — OMEGA-3-ACID ETHYL ESTERS 1 G PO CAPS
2.0000 g | ORAL_CAPSULE | Freq: Two times a day (BID) | ORAL | Status: DC
  Administered 2017-02-16 – 2017-02-19 (×6): 2 g via ORAL
  Filled 2017-02-16 (×6): qty 2

## 2017-02-16 MED ORDER — LABETALOL HCL 200 MG PO TABS: 200.0000 mg | ORAL_TABLET | Freq: Two times a day (BID) | ORAL | Status: DC

## 2017-02-16 MED ORDER — SODIUM CHLORIDE 0.9 % IV SOLN
INTRAVENOUS | Status: DC
  Administered 2017-02-16 – 2017-02-19 (×4): via INTRAVENOUS

## 2017-02-16 MED ORDER — MAGNESIUM 400 MG PO CAPS: 400.0000 mg | ORAL_CAPSULE | Freq: Every day | ORAL | Status: DC

## 2017-02-16 MED ORDER — MYCOPHENOLATE SODIUM 180 MG PO TBEC
720.0000 mg | DELAYED_RELEASE_TABLET | Freq: Two times a day (BID) | ORAL | Status: DC
  Administered 2017-02-16 – 2017-02-19 (×6): 720 mg via ORAL
  Filled 2017-02-16 (×7): qty 4

## 2017-02-16 MED ORDER — OXYCODONE HCL 5 MG PO TABS
5.0000 mg | ORAL_TABLET | ORAL | Status: DC | PRN
  Administered 2017-02-16 – 2017-02-17 (×2): 5 mg via ORAL
  Filled 2017-02-16 (×2): qty 1

## 2017-02-16 MED ORDER — ACETAMINOPHEN 650 MG RE SUPP: 650.0000 mg | Freq: Four times a day (QID) | RECTAL | Status: DC | PRN

## 2017-02-16 MED ORDER — SIMVASTATIN 40 MG PO TABS
40.0000 mg | ORAL_TABLET | Freq: Every day | ORAL | Status: DC
  Administered 2017-02-16 – 2017-02-18 (×3): 40 mg via ORAL
  Filled 2017-02-16 (×5): qty 1

## 2017-02-16 MED ORDER — DEXTROSE 5 % IV SOLN
1.0000 g | INTRAVENOUS | Status: DC
  Administered 2017-02-17 – 2017-02-19 (×3): 1 g via INTRAVENOUS
  Filled 2017-02-16 (×3): qty 10

## 2017-02-16 MED ORDER — ALLOPURINOL 100 MG PO TABS
200.0000 mg | ORAL_TABLET | ORAL | Status: DC
  Administered 2017-02-17 – 2017-02-19 (×3): 200 mg via ORAL
  Filled 2017-02-16 (×3): qty 2

## 2017-02-16 MED ORDER — ACETAMINOPHEN 325 MG PO TABS
650.0000 mg | ORAL_TABLET | Freq: Four times a day (QID) | ORAL | Status: DC | PRN
  Filled 2017-02-16: qty 2

## 2017-02-16 MED ORDER — SODIUM CHLORIDE 0.9 % IV BOLUS (SEPSIS)
1000.0000 mL | Freq: Once | INTRAVENOUS | Status: AC
  Administered 2017-02-16: 1000 mL via INTRAVENOUS

## 2017-02-16 MED ORDER — TACROLIMUS 1 MG PO CAPS
1.0000 mg | ORAL_CAPSULE | Freq: Two times a day (BID) | ORAL | Status: DC
  Administered 2017-02-16 – 2017-02-19 (×6): 1 mg via ORAL
  Filled 2017-02-16 (×7): qty 1

## 2017-02-16 MED ORDER — PREDNISOLONE 5 MG PO TABS
5.0000 mg | ORAL_TABLET | Freq: Every day | ORAL | Status: DC
  Administered 2017-02-16 – 2017-02-18 (×3): 5 mg via ORAL
  Filled 2017-02-16 (×3): qty 1

## 2017-02-16 MED ORDER — MAGNESIUM OXIDE 400 (241.3 MG) MG PO TABS
400.0000 mg | ORAL_TABLET | Freq: Every day | ORAL | Status: DC
  Administered 2017-02-17 – 2017-02-19 (×3): 400 mg via ORAL
  Filled 2017-02-16 (×3): qty 1

## 2017-02-16 MED ORDER — PANTOPRAZOLE SODIUM 40 MG PO TBEC
40.0000 mg | DELAYED_RELEASE_TABLET | Freq: Every day | ORAL | Status: DC
  Administered 2017-02-17 – 2017-02-19 (×3): 40 mg via ORAL
  Filled 2017-02-16 (×3): qty 1

## 2017-02-16 MED ORDER — PHENAZOPYRIDINE HCL 200 MG PO TABS
200.0000 mg | ORAL_TABLET | Freq: Three times a day (TID) | ORAL | Status: DC
  Administered 2017-02-16 – 2017-02-17 (×4): 200 mg via ORAL
  Filled 2017-02-16 (×2): qty 1
  Filled 2017-02-16: qty 2
  Filled 2017-02-16: qty 1

## 2017-02-16 MED ORDER — CLONIDINE HCL 0.2 MG PO TABS
0.2000 mg | ORAL_TABLET | Freq: Every evening | ORAL | Status: DC
  Administered 2017-02-16 – 2017-02-18 (×3): 0.2 mg via ORAL
  Filled 2017-02-16 (×3): qty 1

## 2017-02-16 NOTE — Progress Notes (Signed)
Patient arrived from E.D.alert and oriented x 4.Patient ambulated very well with steady gait.Skin assessed with Jonni Sanger R.N.-No skin issue.Placed in droplet precaution.Not in distress.

## 2017-02-16 NOTE — ED Notes (Signed)
Pt ambulatory to the restroom.  

## 2017-02-16 NOTE — ED Notes (Signed)
Attempted report x1. 

## 2017-02-16 NOTE — ED Notes (Signed)
Patient transported to X-ray 

## 2017-02-16 NOTE — ED Provider Notes (Signed)
[place] DEPT Provider Note   CSN: [id number] Arrival date & time: 02/16/17  1103     History   Chief Complaint Chief Complaint  Patient presents with  . Abdominal Pain  . Urinary Frequency    HPI [name] is a 56 y.o. male.  HPI    56 y.o. male with hx of CAD s/p CABG, PAF and flutter (on ASA only), HTN, HOCM, HLD, and CKD s/p renal transplant in 2014 (still on Prograf and Myfortic) who presents to the Emergency Department with reports of urinary frequency, dysuria and fatigue.  Patient notes a history of bladder stones most recently had cystoscopy 5 days ago performed by Dr. Gaynelle Arabian.  He notes 3 days ago he started to develop urinary frequency, dysuria, and the sensation of incomplete void.  Patient notes he is also had body aches, headache, and feels dehydrated.  She denies any testicular pain or penile discharge.  Patient is taking Bactrim Monday Wednesday and Friday chronically status post renal transplant.   Past Medical History:  Diagnosis Date  . CAD (coronary artery disease)    a. s/p CABG 03/2012: LIMA to LAD, free RIMA to OM1, SVG to D1, sequential SVG to AM and LPLB2, EVH via right thigh and leg.  . Cellulitis 04/13/2012   RLE saphenous vein harvest incision   . CKD (chronic kidney disease) stage 5, GFR less than 15 ml/min (HCC)    a. Diagnosed age 89, s/p yes transplant  . Dysrhythmia    A flutter, PACs and PVCs  . Elevated cholesterol   . Gout   . Hyperparathyroidism   . Hypertension   . Hypertrophic cardiomyopathy ([place])   . Migraines   . PAF and Flutter    a. Afib post-op CABG; AFlutter 10/2012  . Peripheral vascular disease ([place])   . Sinus node dysfunction-post termination pause    a. >8sec in setting of aflutter 10/2012  . Stroke [facility]) 1995; 2001   denies residual  . Superficial vein thrombosis    a. R greater saphenous 04/2012    Patient Active Problem List   Diagnosis Date Noted  . SIRS (systemic inflammatory response syndrome) ([place])  02/16/2017  . Renal transplant recipient 02/16/2017  . Immunosuppressed status ([place]) 02/16/2017  . UTI (urinary tract infection) 02/16/2017  . HOCM (hypertrophic obstructive cardiomyopathy) ([place]) 02/16/2017  . Thrombocytopenia ([place]) 02/16/2017  . Murmur 02/16/2014  . Long term (current) use of anticoagulants 11/02/2012  . NSTEMI (non-ST elevated myocardial infarction) ([place]) 10/28/2012  . CAD (coronary artery disease) of artery bypass graft 10/28/2012  . Sinus node dysfunction-post termination pause   . Stroke ([place])   . PAF and Aflutter 07/09/2012  . S/P CABG (coronary artery bypass graft) 05/12/2012  . HTN (hypertension) 01/15/2012  . Smoking 01/15/2012  . CKD (chronic kidney disease), stage III 01/15/2012    Past Surgical History:  Procedure Laterality Date  . ATRIAL FLUTTER ABLATION N/A 11/09/2012   Procedure: ATRIAL FLUTTER ABLATION;  Surgeon: [name], MD;  Location: [facility] CATH LAB;  Service: Cardiovascular;  Laterality: N/A;  . AV FISTULA PLACEMENT  2011   left forearm  . AV FISTULA PLACEMENT    . CARDIAC CATHETERIZATION  03/16/12  . CORONARY ARTERY BYPASS GRAFT  03/19/2012   Procedure: CORONARY ARTERY BYPASS GRAFTING (CABG);  Surgeon: Rexene Alberts, MD;  Location: [place];  Service: Open Heart Surgery;  Laterality: N/A;  (B) MAMMARY  . CYSTOSCOPY WITH BIOPSY N/A 08/15/2016   Procedure: CYSTOSCOPY WITH IDENTIFICATION OF RIGHT  TRANSPLANTATION OF URETRAL ORFICE WITH FLUORESCEIN;  Surgeon: Carolan Clines, MD;  Location: [facility] ORS;  Service: Urology;  Laterality: N/A;  . DENTAL SURGERY  2008   multiple  . [place]   left  . FRACTURE SURGERY    . HERNIA REPAIR  82/4235   umbilical  . HOLMIUM LASER APPLICATION N/A 3/61/4431   Procedure: HOLMIUM LASER APPLICATION WITH 3 CM RIGHT BLADDER STONE;  Surgeon: Carolan Clines, MD;  Location: [facility] ORS;  Service: Urology;  Laterality: N/A;  . INGUINAL HERNIA REPAIR  09/2009   bilaterally  . KIDNEY TRANSPLANT    .  LEFT HEART CATHETERIZATION WITH CORONARY ANGIOGRAM N/A 03/16/2012   Procedure: LEFT HEART CATHETERIZATION WITH CORONARY ANGIOGRAM;  Surgeon: Sherren Mocha, MD;  Location[facility] C[facility] LAB;  Service: Cardiovascular;  Laterality: N/A;  . RENAL ARTERY STENT  2001  . TRANSURETHRAL RESECTION OF BLADDER TUMOR N/A 08/15/2016   Procedure: TRANSURETHRAL BIOPSY OF RIGHT BLADDER WALL;  Surgeon: Carolan Clines, MD;  Location: [facility] ORS;  Service: Urology;  Laterality: N/A;       Home Medications    Prior to Admission medications   Medication Sig Start Date End Date Taking? Authorizing Provider  allopurinol (ZYLOPRIM) 100 MG tablet Take 200 mg by mouth every morning.    Yes Historical Provider, MD  aspirin 81 MG EC tablet Take 1 tablet (81 mg total) by mouth daily. 10/28/12  Yes Rogelia Mire, NP  CIALIS 5 MG tablet Take 5 mg by mouth daily as needed for erectile dysfunction.  12/12/15  Yes Historical Provider, MD  cloNIDine (CATAPRES) 0.2 MG tablet Take 0.2 mg by mouth every evening.    Yes Historical Provider, MD  diltiazem (CARDIZEM) 60 MG tablet Take 1 tablet (60 mg total) by mouth as needed (for palpitations). 01/08/17  Yes Josue Hector, MD  fenofibrate micronized (LOFIBRA) 134 MG capsule Take 134 mg by mouth daily.    Yes Historical Provider, MD  labetalol (NORMODYNE) 200 MG tablet Take 1 tablet (200 mg total) by mouth 2 (two) times daily. 07/11/16  Yes [name], MD  Magnesium 400 MG CAPS Take 400 mg by mouth daily.    Yes Historical Provider, MD  mycophenolate (MYFORTIC) 180 MG EC tablet Take 720 mg by mouth 2 (two) times daily.    Yes Historical Provider, MD  omega-3 acid ethyl esters (LOVAZA) 1 G capsule Take 2 g by mouth 2 (two) times daily.   Yes Historical Provider, MD  omeprazole (PRILOSEC) 20 MG capsule Take 20 mg by mouth daily. 12/12/15  Yes Historical Provider, MD  prednisoLONE 5 MG TABS tablet Take 5 mg by mouth at bedtime.    Yes Historical Provider, MD  simvastatin (ZOCOR) 40 MG  tablet Take 40 mg by mouth at bedtime.    Yes Historical Provider, MD  sulfamethoxazole-trimethoprim (BACTRIM,SEPTRA) 400-80 MG tablet Take 1 tablet by mouth every Monday, Wednesday, and Friday. 02/10/17  Yes Historical Provider, MD  tacrolimus (PROGRAF) 1 MG capsule Take 1 mg by mouth 2 (two) times daily. 12/12/15  Yes Historical Provider, MD  Vitamin D, Ergocalciferol, (DRISDOL) 50000 units CAPS capsule Take 50,000 Units by mouth 2 (two) times a week. On Tuesdays and Saturdays 06/11/16  Yes Historical Provider, MD  metoprolol (LOPRESSOR) 50 MG tablet Take 50-100 mg by mouth daily as needed (AFIB).    Historical Provider, MD    Family History Family History  Problem Relation Age of Onset  . Adopted: Yes  . Family history  unknown: Yes    Social History Social History  Substance Use Topics  . Smoking status: Former Smoker    Packs/day: 0.30    Years: 30.00    Types: Cigarettes    Quit date: 03/15/2012  . Smokeless tobacco: Never Used  . Alcohol use Yes     Comment: 04/13/12 "2 mixed drinks/month"     Allergies   Contrast media [iodinated diagnostic agents] and Penicillins   Review of Systems Review of Systems  All other systems reviewed and are negative.    Physical Exam Updated Vital Signs BP 130/87   Pulse 69   Temp 98.2 F (36.8 C) (Oral)   Resp (!) 27   Ht 5\' 8"  (1.727 m)   Wt 85.3 kg   SpO2 97%   BMI 28.59 kg/m   Physical Exam  Constitutional: He is oriented to person, place, and time. He appears well-developed and well-nourished.  HENT:  Head: Normocephalic and atraumatic.  Eyes: Conjunctivae are normal. Pupils are equal, round, and reactive to light. Right eye exhibits no discharge. Left eye exhibits no discharge. No scleral icterus.  Neck: Normal range of motion. No JVD present. No tracheal deviation present.  Pulmonary/Chest: Effort normal. No stridor.  Abdominal: Soft. He exhibits no distension and no mass. There is tenderness. There is no rebound and no  guarding. No hernia.  TTP suprapubic RLQ region    Neurological: He is alert and oriented to person, place, and time. Coordination normal.  Psychiatric: He has a normal mood and affect. His behavior is normal. Judgment and thought content normal.  Nursing note and vitals reviewed.    ED Treatments / Results  Labs (all labs ordered are listed, but only abnormal results are displayed) Labs Reviewed  URINALYSIS, ROUTINE W REFLEX MICROSCOPIC - Abnormal; Notable for the following:       Result Value   Color, Urine AMBER (*)    APPearance HAZY (*)    Hgb urine dipstick MODERATE (*)    Protein, ur 100 (*)    Leukocytes, UA SMALL (*)    Bacteria, UA RARE (*)    All other components within normal limits  CBC WITH DIFFERENTIAL/PLATELET - Abnormal; Notable for the following:    WBC 17.1 (*)    RBC 5.88 (*)    Platelets 137 (*)    Neutro Abs 14.8 (*)    Monocytes Absolute 1.7 (*)    All other components within normal limits  BASIC METABOLIC PANEL - Abnormal; Notable for the following:    Sodium 132 (*)    CO2 21 (*)    Glucose, Bld 116 (*)    Creatinine, Ser 1.63 (*)    Calcium 8.7 (*)    GFR calc non Af Amer 47 (*)    GFR calc Af Amer 54 (*)    All other components within normal limits  URINE CULTURE  CULTURE, BLOOD (ROUTINE X 2)  CULTURE, BLOOD (ROUTINE X 2)  PROCALCITONIN  SEDIMENTATION RATE  LACTIC ACID, PLASMA  INFLUENZA PANEL BY PCR (TYPE A & B)    EKG  EKG Interpretation None       Radiology Ct Abdomen Pelvis Wo Contrast  Result Date: 02/16/2017 CLINICAL DATA:  Previous renal transplant. Abdominal pain. Headache. Difficulty urinating. EXAM: CT ABDOMEN AND PELVIS WITHOUT CONTRAST TECHNIQUE: Multidetector CT imaging of the abdomen and pelvis was performed following the standard protocol without IV contrast. COMPARISON:  05/29/2016 FINDINGS: Lower chest: Lung bases are clear. No pleural or pericardial fluid. The heart  is enlarged. Hepatobiliary: Liver parenchyma  appears normal without contrast. No calcified gallstones. Pancreas: Normal Spleen: Normal Adrenals/Urinary Tract: Adrenal glands are normal. Native right kidney is markedly atrophic. Native left kidney is atrophic, with 2 cysts at the upper pole that are stable, the larger measuring 3.4 cm in diameter and the smaller measuring 1.7 cm in diameter. Transplant kidney in the right iliac fossa appears unremarkable by CT. No hydronephrosis. No stone disease. Bladder appears normal. Stomach/Bowel: No acute bowel finding. Previous abdominal mesh. Appendix appears normal. Vascular/Lymphatic: Aortic atherosclerosis. Iliac atherosclerosis. No aneurysm. IVC is normal. No retroperitoneal adenopathy. Reproductive: Negative Other: No free fluid or air. Musculoskeletal: Normal IMPRESSION: No acute finding by CT. Right iliac fossa transplant kidney appears unremarkable. No hydronephrosis. Atrophic native kidneys as seen previously with benign appearing renal cysts on the left. Aortic atherosclerosis. Iliac atherosclerosis. No signs of mesenteric ischemia on this exam. Electronically Signed   By: Nelson Chimes M.D.   On: 02/16/2017 14:22   Dg Chest 2 View  Result Date: 02/16/2017 CLINICAL DATA:  Productive cough for 2-3 weeks, high WBC. Hx of CABG(2013), kidney transplant(2014), hypertension, CAD, dysrhythmia, hyperparathyroidism, PAF and flutter, stroke, cardiac catheterization, AV fistula placement(2011). Former smoker. EXAM: CHEST  2 VIEW COMPARISON:  11/30/2016 FINDINGS: Prior CABG. Atherosclerotic calcification of the aortic arch. Mild enlargement of the cardiopericardial silhouette. No pulmonary edema.  No pleural effusion.  The lungs appear clear. IMPRESSION: 1. Mild enlargement of the cardiopericardial silhouette with prior CABG. No edema or additional findings. Electronically Signed   By: Van Clines M.D.   On: 02/16/2017 16:45    Procedures Procedures (including critical care time)  Medications Ordered in  ED Medications  phenazopyridine (PYRIDIUM) tablet 200 mg (200 mg Oral Given 02/16/17 1558)  0.9 %  sodium chloride infusion ( Intravenous New Bag/Given 02/16/17 1543)  oxyCODONE (Oxy IR/ROXICODONE) immediate release tablet 5 mg (not administered)  aspirin EC tablet 81 mg (81 mg Oral Given 02/16/17 1558)  diltiazem (CARDIZEM) tablet 60 mg (not administered)  cloNIDine (CATAPRES) tablet 0.2 mg (not administered)  mycophenolate (MYFORTIC) EC tablet 720 mg (not administered)  prednisoLONE tablet 5 mg (not administered)  simvastatin (ZOCOR) tablet 40 mg (not administered)  tacrolimus (PROGRAF) capsule 1 mg (not administered)  labetalol (NORMODYNE) tablet 200 mg (200 mg Oral Given 02/16/17 1633)  sodium chloride 0.9 % bolus 1,000 mL (0 mLs Intravenous Stopped 02/16/17 1539)  cefTRIAXone (ROCEPHIN) 1 g in dextrose 5 % 50 mL IVPB (0 g Intravenous Stopped 02/16/17 1539)     Initial Impression / Assessment and Plan / ED Course  I have reviewed the triage vital signs and the nursing notes.  Pertinent labs & imaging results that were available during my care of the patient were reviewed by me and considered in my medical decision making (see chart for details).      Final Clinical Impressions(s) / ED Diagnoses   Final diagnoses:  Acute cystitis without hematuria    Labs: CBC, BMP, urinalysis  Imaging:  Consults:  Therapeutics: Normal saline  Discharge Meds:   Assessment/Plan: 56 year old male presents today with urinary tract infection secondary to cystoscopy.  Patient is immunosuppressed status post renal transplant.  Blood cultures were drawn, antibiotics given, tried consulted for admission pending blood culture and ongoing antibiotics.   I spoke with Dr. Alyson Ingles of urology who personally reviewed patient's CT scan, they agreed antibiotics appropriate for this patient notes need for urology intervention at this time.   New Prescriptions New Prescriptions   No medications  on file      Okey Regal, PA-C 02/16/17 [place], MD 02/16/17 [phone]

## 2017-02-16 NOTE — ED Triage Notes (Signed)
Pt. Stated, I have bladder stones and I think I have a uti and its frequency since  Friday and pain.

## 2017-02-16 NOTE — H&P (Signed)
History and Physical    CHIJIOKE Haley FXT:[id number] DOB: [birth year] DOA: 02/16/2017   PCP: Gennette Pac, MD   Patient coming from/Resides with: Private residence/lives with wife and daughter  Admission status: Observation/medical floor -it may be medically necessary to stay a minimum 2 midnights to rule out impending and/or unexpected changes in physiologic status that may differ from initial evaluation performed in the ER and/or at time of admission therefore consider reevaluation of admission status in 24 hours.   Chief Complaint: Dysuria/urinary frequency  HPI: [name] is a 56 y.o. male with medical history significant for PAF maintaining sinus rhythm, hypertension, chronic kidney disease status post renal transplant on chronic immunosuppression, known bladder stones, history of CAD and prior CABG, previous stroke, and hypertrophic obstructive cardiomyopathy. Patient reports has been having generalized malaise with cough for about 1-1/2 weeks. He reports he traveled during the same time period to [place] for a vacation. This past Tuesday he underwent cystoscopy at the urologist office had confirmed bladder stones with later plans for removal of the stones. In the past 24 hours he developed significant dysuria without hematuria and urinary frequency and low grade fevers. Since he is chronically immunosuppressed post renal transplant he presented to the ER for further evaluation and treatment.  ED Course:  Vital Signs: BP 133/82   Pulse 72   Temp 98.2 F (36.8 C) (Oral)   Resp 15   Ht 5\' 8"  (1.727 m)   Wt 85.3 kg (188 lb)   SpO2 97%   BMI 28.59 kg/m  CT abdomen/pelvis without contrast: No acute findings Lab data: Sodium 132, potassium 3.8, chloride 102, CO2 21, glucose 116, BUN 18, creatinine 1.63, anion gap 9, WBCs 17,100 with neutrophils 86% and absolute neutrophils 14.8%, hemoglobin 15.8, platelets 137,000; urinalysis markedly abnormal with hazy appearance, rare  bacteria, amber color, moderate hemoglobin, small leukocytes, negative nitrite, 100 protein, RBCs too numerous to count, WBCs 6-30, cultures obtained in the ER, urine culture obtained in the ER Medications and treatments: Normal saline bolus 1 L, Rocephin 1 g IV 1  Review of Systems:  In addition to the HPI above,  No Headache, changes with Vision or hearing, new weakness, tingling, numbness in any extremity, dizziness, dysarthria or word finding difficulty, gait disturbance or imbalance, tremors or seizure activity No problems swallowing food or Liquids, indigestion/reflux, choking or coughing while eating, abdominal pain with or after eating No Chest pain, Cough or Shortness of Breath, palpitations, orthopnea or DOE No Abdominal pain, N/V, melena,hematochezia, dark tarry stools, constipation No hematuria or flank pain No new skin rashes, lesions, masses or bruises, No new joint pains, aches, swelling or redness No recent unintentional weight gain or loss No polyuria, polydypsia or polyphagia   Past Medical History:  Diagnosis Date  . CAD (coronary artery disease)    a. s/p CABG 03/2012: LIMA to LAD, free RIMA to OM1, SVG to D1, sequential SVG to AM and LPLB2, EVH via right thigh and leg.  . Cellulitis 04/13/2012   RLE saphenous vein harvest incision   . CKD (chronic kidney disease) stage 5, GFR less than 15 ml/min (HCC)    a. Diagnosed age 50, s/p yes transplant  . Dysrhythmia    A flutter, PACs and PVCs  . Elevated cholesterol   . Gout   . Hyperparathyroidism   . Hypertension   . Hypertrophic cardiomyopathy ([place])   . Migraines   . PAF and Flutter    a. Afib post-op CABG; AFlutter 10/2012  .  Peripheral vascular disease ([place])   . Sinus node dysfunction-post termination pause    a. >8sec in setting of aflutter 10/2012  . Stroke [facility]) 1995; 2001   denies residual  . Superficial vein thrombosis    a. R greater saphenous 04/2012    Past Surgical History:  Procedure Laterality  Date  . ATRIAL FLUTTER ABLATION N/A 11/09/2012   Procedure: ATRIAL FLUTTER ABLATION;  Surgeon: [name], MD;  Location: [facility] CATH LAB;  Service: Cardiovascular;  Laterality: N/A;  . AV FISTULA PLACEMENT  2011   left forearm  . AV FISTULA PLACEMENT    . CARDIAC CATHETERIZATION  03/16/12  . CORONARY ARTERY BYPASS GRAFT  03/19/2012   Procedure: CORONARY ARTERY BYPASS GRAFTING (CABG);  Surgeon: Rexene Alberts, MD;  Location: [facility];  Service: Open Heart Surgery;  Laterality: N/A;  (B) MAMMARY  . CYSTOSCOPY WITH BIOPSY N/A 08/15/2016   Procedure: CYSTOSCOPY WITH IDENTIFICATION OF RIGHT TRANSPLANTATION OF URETRAL ORFICE WITH FLUORESCEIN;  Surgeon: Carolan Clines, MD;  Location: [facility] ORS;  Service: Urology;  Laterality: N/A;  . DENTAL SURGERY  2008   multiple  . [place]   left  . FRACTURE SURGERY    . HERNIA REPAIR  93/8101   umbilical  . HOLMIUM LASER APPLICATION N/A 7/51/0258   Procedure: HOLMIUM LASER APPLICATION WITH 3 CM RIGHT BLADDER STONE;  Surgeon: Carolan Clines, MD;  Location: [facility] ORS;  Service: Urology;  Laterality: N/A;  . INGUINAL HERNIA REPAIR  09/2009   bilaterally  . KIDNEY TRANSPLANT    . LEFT HEART CATHETERIZATION WITH CORONARY ANGIOGRAM N/A 03/16/2012   Procedure: LEFT HEART CATHETERIZATION WITH CORONARY ANGIOGRAM;  Surgeon: Sherren Mocha, MD;  Location: [facility] CATH LAB;  Service: Cardiovascular;  Laterality: N/A;  . RENAL ARTERY STENT  2001  . TRANSURETHRAL RESECTION OF BLADDER TUMOR N/A 08/15/2016   Procedure: TRANSURETHRAL BIOPSY OF RIGHT BLADDER WALL;  Surgeon: Carolan Clines, MD;  Location: [facility] ORS;  Service: Urology;  Laterality: N/A;    Social History   Social History  . Marital status: Married    Spouse name: N/A  . Number of children: N/A  . Years of education: N/A   Occupational History  . Not on file.   Social History Main Topics  . Smoking status: Former Smoker    Packs/day: 0.30    Years: 30.00    Types: Cigarettes    Quit  date: 03/15/2012  . Smokeless tobacco: Never Used  . Alcohol use Yes     Comment: 04/13/12 "2 mixed drinks/month"  . Drug use: No  . Sexual activity: Yes   Other Topics Concern  . Not on file   Social History Narrative  . 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 Contrast Media [Iodinated Diagnostic Agents]     coded  . Penicillins Itching and Rash    Has patient had a PCN reaction causing immediate rash, facial/tongue/throat swelling, SOB or lightheadedness with hypotension: Yes Has patient had a PCN reaction causing severe rash involving mucus membranes or skin necrosis: No Has patient had a PCN reaction that required hospitalization No Has patient had a PCN reaction occurring within the last 10 years: No If all of the above answers are "NO", then may proceed with Cephalosporin use.     Family History  Problem Relation Age of Onset  .  Adopted: Yes  . Family history unknown: Yes     Prior to Admission medications   Medication Sig Start Date End Date Taking? Authorizing Provider  allopurinol (ZYLOPRIM) 100 MG tablet Take 200 mg by mouth every morning.    Yes Historical Provider, MD  aspirin 81 MG EC tablet Take 1 tablet (81 mg total) by mouth daily. 10/28/12  Yes Rogelia Mire, NP  CIALIS 5 MG tablet Take 5 mg by mouth daily as needed for erectile dysfunction.  12/12/15  Yes Historical Provider, MD  cloNIDine (CATAPRES) 0.2 MG tablet Take 0.2 mg by mouth every evening.    Yes Historical Provider, MD  diltiazem (CARDIZEM) 60 MG tablet Take 1 tablet (60 mg total) by mouth as needed (for palpitations). 01/08/17  Yes Josue Hector, MD  fenofibrate micronized (LOFIBRA) 134 MG capsule Take 134 mg by mouth daily.    Yes Historical Provider, MD  labetalol (NORMODYNE) 200 MG tablet Take 1 tablet (200 mg total) by mouth 2 (two) times  daily. 07/11/16  Yes [name], MD  Magnesium 400 MG CAPS Take 400 mg by mouth daily.    Yes Historical Provider, MD  mycophenolate (MYFORTIC) 180 MG EC tablet Take 720 mg by mouth 2 (two) times daily.    Yes Historical Provider, MD  omega-3 acid ethyl esters (LOVAZA) 1 G capsule Take 2 g by mouth 2 (two) times daily.   Yes Historical Provider, MD  omeprazole (PRILOSEC) 20 MG capsule Take 20 mg by mouth daily. 12/12/15  Yes Historical Provider, MD  prednisoLONE 5 MG TABS tablet Take 5 mg by mouth at bedtime.    Yes Historical Provider, MD  simvastatin (ZOCOR) 40 MG tablet Take 40 mg by mouth at bedtime.    Yes Historical Provider, MD  sulfamethoxazole-trimethoprim (BACTRIM,SEPTRA) 400-80 MG tablet Take 1 tablet by mouth every Monday, Wednesday, and Friday. 02/10/17  Yes Historical Provider, MD  tacrolimus (PROGRAF) 1 MG capsule Take 1 mg by mouth 2 (two) times daily. 12/12/15  Yes Historical Provider, MD  Vitamin D, Ergocalciferol, (DRISDOL) 50000 units CAPS capsule Take 50,000 Units by mouth 2 (two) times a week. On Tuesdays and Saturdays 06/11/16  Yes Historical Provider, MD  metoprolol (LOPRESSOR) 50 MG tablet Take 50-100 mg by mouth daily as needed (AFIB).    Historical Provider, MD    Physical Exam: Vitals:   02/16/17 1118 02/16/17 1230 02/16/17 1315 02/16/17 1515  BP:  128/73 125/80 133/82  Pulse:  61 63 72  Resp:  (!) 22 (!) 24 15  Temp:      TempSrc:      SpO2:  95% 96% 97%  Weight: 85.3 kg (188 lb)     Height: 5\' 8"  (1.727 m)         Constitutional: NAD, calm, comfortable Eyes: PERRL, lids and conjunctivae normal ENMT: Mucous membranes are moist. Posterior pharynx clear of any exudate or lesions.Normal dentition.  Neck: normal, supple, no masses, no thyromegaly Respiratory: Nonproductive cough, worse to auscultation bilaterally, no wheezing, no crackles. Normal respiratory effort. No accessory muscle use.  Cardiovascular: Regular rate and rhythm, no murmurs / rubs / gallops.  No extremity edema. 2+ pedal pulses. No carotid bruits.  Abdomen: no masses palpated. No hepatosplenomegaly. Bowel sounds positive. Mild suprapubic tenderness without guarding or rebounding Musculoskeletal: no clubbing / cyanosis. No joint deformity upper and lower extremities. Good ROM, no contractures. Normal muscle tone.  Skin: no rashes, lesions, ulcers. No induration Neurologic: CN 2-12 grossly intact. Sensation intact, DTR  normal. Strength 5/5 x all 4 extremities.  Psychiatric: Normal judgment and insight. Alert and oriented x 3. Normal mood.    Labs on Admission: I have personally reviewed following labs and imaging studies  CBC:  Recent Labs Lab 02/16/17 1201  WBC 17.1*  NEUTROABS 14.8*  HGB 15.8  HCT 47.7  MCV 81.1  PLT 921*   Basic Metabolic Panel:  Recent Labs Lab 02/16/17 1201  NA 132*  K 3.8  CL 102  CO2 21*  GLUCOSE 116*  BUN 18  CREATININE 1.63*  CALCIUM 8.7*   GFR: Estimated Creatinine Clearance: 55.7 mL/min (A) (by C-G formula based on SCr of 1.63 mg/dL (H)). Liver Function Tests: No results for input(s): AST, ALT, ALKPHOS, BILITOT, PROT, ALBUMIN in the last 168 hours. No results for input(s): LIPASE, AMYLASE in the last 168 hours. No results for input(s): AMMONIA in the last 168 hours. Coagulation Profile: No results for input(s): INR, PROTIME in the last 168 hours. Cardiac Enzymes: No results for input(s): CKTOTAL, CKMB, CKMBINDEX, TROPONINI in the last 168 hours. BNP (last 3 results) No results for input(s): PROBNP in the last 8760 hours. HbA1C: No results for input(s): HGBA1C in the last 72 hours. CBG: No results for input(s): GLUCAP in the last 168 hours. Lipid Profile: No results for input(s): CHOL, HDL, LDLCALC, TRIG, CHOLHDL, LDLDIRECT in the last 72 hours. Thyroid Function Tests: No results for input(s): TSH, T4TOTAL, FREET4, T3FREE, THYROIDAB in the last 72 hours. Anemia Panel: No results for input(s): VITAMINB12, FOLATE, FERRITIN,  TIBC, IRON, RETICCTPCT in the last 72 hours. Urine analysis:    Component Value Date/Time   COLORURINE AMBER (A) 02/16/2017 1132   APPEARANCEUR HAZY (A) 02/16/2017 1132   LABSPEC 1.028 02/16/2017 1132   PHURINE 5.0 02/16/2017 1132   GLUCOSEU NEGATIVE 02/16/2017 1132   HGBUR MODERATE (A) 02/16/2017 1132   BILIRUBINUR NEGATIVE 02/16/2017 1132   KETONESUR NEGATIVE 02/16/2017 1132   PROTEINUR 100 (A) 02/16/2017 1132   UROBILINOGEN 1.0 11/21/2014 1329   NITRITE NEGATIVE 02/16/2017 1132   LEUKOCYTESUR SMALL (A) 02/16/2017 1132   Sepsis Labs: @LABRCNTIP (procalcitonin:4,lacticidven:4) )No results found for this or any previous visit (from the past 240 hour(s)).   Radiological Exams on Admission: Ct Abdomen Pelvis Wo Contrast  Result Date: 02/16/2017 CLINICAL DATA:  Previous renal transplant. Abdominal pain. Headache. Difficulty urinating. EXAM: CT ABDOMEN AND PELVIS WITHOUT CONTRAST TECHNIQUE: Multidetector CT imaging of the abdomen and pelvis was performed following the standard protocol without IV contrast. COMPARISON:  05/29/2016 FINDINGS: Lower chest: Lung bases are clear. No pleural or pericardial fluid. The heart is enlarged. Hepatobiliary: Liver parenchyma appears normal without contrast. No calcified gallstones. Pancreas: Normal Spleen: Normal Adrenals/Urinary Tract: Adrenal glands are normal. Native right kidney is markedly atrophic. Native left kidney is atrophic, with 2 cysts at the upper pole that are stable, the larger measuring 3.4 cm in diameter and the smaller measuring 1.7 cm in diameter. Transplant kidney in the right iliac fossa appears unremarkable by CT. No hydronephrosis. No stone disease. Bladder appears normal. Stomach/Bowel: No acute bowel finding. Previous abdominal mesh. Appendix appears normal. Vascular/Lymphatic: Aortic atherosclerosis. Iliac atherosclerosis. No aneurysm. IVC is normal. No retroperitoneal adenopathy. Reproductive: Negative Other: No free fluid or air.  Musculoskeletal: Normal IMPRESSION: No acute finding by CT. Right iliac fossa transplant kidney appears unremarkable. No hydronephrosis. Atrophic native kidneys as seen previously with benign appearing renal cysts on the left. Aortic atherosclerosis. Iliac atherosclerosis. No signs of mesenteric ischemia on this exam. Electronically Signed   By:  Nelson Chimes M.D.   On: 02/16/2017 14:22     Assessment/Plan Principal Problem:   SIRS (systemic inflammatory response syndrome)  -Patient presents with generalized malaise 1-1/2 weeks with development of dysuria and urinary frequency after cystoscopy at the urology office this past Tuesday -Patient is hemodynamically stable and shows no evidence of end organ involvement but did have white count greater than 12,000 and bands greater than 10% -Patient is immunocompromised so increased risk for evolving into sepsis -Gentle IV fluid hydration given known HOCM -Supportive care -Infectious source at this juncture appears to be limited to urinary tract although he is having some coughing and may have influenza early pneumonia-may need to adjust antibiotic therapy based on additional evaluation -2 view chest x-ray and influenza PCR -Procalcitonin and lactic acid  Active Problems:   Renal transplant recipient/Immunosuppressed status  -As above -Continue preadmission prednisolone, Myfortic and Prograf    UTI (urinary tract infection)/Thrombocytopenia/bladder calculi -Recent instrumentation/cystoscopy at urologist's office this past Tuesday -Have asked EDP to contact patient's urologist and notify of admission -Patient reports will undergo retrieval of stones via cystoscopy at a later date -Urine appears consistent with UTI and patient is symptomatic -Continue Rocephin as ordered an ER -Pyridium for bladder spasms -Follow-up on blood and urine cultures    HTN (hypertension) -Continue preadmission Catapres, Cardizem and labetalol    CKD (chronic kidney  disease), stage III -Anal function stable and at baseline and no evidence of hydronephrosis on today's CT scan    PAF and Aflutter -Currently in a regular rhythm -Continue beta blocker and calcium channel blocker -Not chronically anticoagulated    S/P CABG (coronary artery bypass graft) -Currently asymptomatic -Continue aspirin, beta blocker, statin as well as omega-3 fatty acids and fenofibrate    HOCM (hypertrophic obstructive cardiomyopathy)  -Currently no signs of heart failure -Cautious administration of IV fluids      DVT prophylaxis: Lovenox  Code Status: Full Family Communication: No family at bedside Disposition Plan: Home Consults called: [facility]-call placed by EDP    Annella Prowell L. ANP-BC [facility] Pager [phone]   If 7PM-7AM, please contact night-coverage [email or web address] Password [facility]  02/16/2017, 3:56 PM

## 2017-02-17 DIAGNOSIS — I421 Obstructive hypertrophic cardiomyopathy: Secondary | ICD-10-CM | POA: Diagnosis not present

## 2017-02-17 DIAGNOSIS — D696 Thrombocytopenia, unspecified: Secondary | ICD-10-CM | POA: Diagnosis present

## 2017-02-17 DIAGNOSIS — Z940 Kidney transplant status: Secondary | ICD-10-CM | POA: Diagnosis not present

## 2017-02-17 DIAGNOSIS — I120 Hypertensive chronic kidney disease with stage 5 chronic kidney disease or end stage renal disease: Secondary | ICD-10-CM | POA: Diagnosis present

## 2017-02-17 DIAGNOSIS — Z79899 Other long term (current) drug therapy: Secondary | ICD-10-CM | POA: Diagnosis not present

## 2017-02-17 DIAGNOSIS — Z91041 Radiographic dye allergy status: Secondary | ICD-10-CM | POA: Diagnosis not present

## 2017-02-17 DIAGNOSIS — Z87891 Personal history of nicotine dependence: Secondary | ICD-10-CM | POA: Diagnosis not present

## 2017-02-17 DIAGNOSIS — N3000 Acute cystitis without hematuria: Secondary | ICD-10-CM | POA: Diagnosis not present

## 2017-02-17 DIAGNOSIS — I480 Paroxysmal atrial fibrillation: Secondary | ICD-10-CM | POA: Diagnosis present

## 2017-02-17 DIAGNOSIS — R6510 Systemic inflammatory response syndrome (SIRS) of non-infectious origin without acute organ dysfunction: Secondary | ICD-10-CM | POA: Diagnosis not present

## 2017-02-17 DIAGNOSIS — N210 Calculus in bladder: Secondary | ICD-10-CM | POA: Diagnosis present

## 2017-02-17 DIAGNOSIS — Z880 Allergy status to penicillin: Secondary | ICD-10-CM | POA: Diagnosis not present

## 2017-02-17 DIAGNOSIS — I4892 Unspecified atrial flutter: Secondary | ICD-10-CM | POA: Diagnosis present

## 2017-02-17 DIAGNOSIS — J101 Influenza due to other identified influenza virus with other respiratory manifestations: Secondary | ICD-10-CM | POA: Diagnosis present

## 2017-02-17 DIAGNOSIS — R05 Cough: Secondary | ICD-10-CM | POA: Diagnosis not present

## 2017-02-17 DIAGNOSIS — N183 Chronic kidney disease, stage 3 (moderate): Secondary | ICD-10-CM | POA: Diagnosis not present

## 2017-02-17 DIAGNOSIS — Z951 Presence of aortocoronary bypass graft: Secondary | ICD-10-CM | POA: Diagnosis not present

## 2017-02-17 DIAGNOSIS — N185 Chronic kidney disease, stage 5: Secondary | ICD-10-CM | POA: Diagnosis present

## 2017-02-17 DIAGNOSIS — Z7982 Long term (current) use of aspirin: Secondary | ICD-10-CM | POA: Diagnosis not present

## 2017-02-17 LAB — URINE CULTURE

## 2017-02-17 LAB — CBC
HEMATOCRIT: 45.8 % (ref 39.0–52.0)
HEMOGLOBIN: 14.9 g/dL (ref 13.0–17.0)
MCH: 26.5 pg (ref 26.0–34.0)
MCHC: 32.5 g/dL (ref 30.0–36.0)
MCV: 81.5 fL (ref 78.0–100.0)
Platelets: 156 10*3/uL (ref 150–400)
RBC: 5.62 MIL/uL (ref 4.22–5.81)
RDW: 15.2 % (ref 11.5–15.5)
WBC: 13.3 10*3/uL — AB (ref 4.0–10.5)

## 2017-02-17 LAB — BASIC METABOLIC PANEL
ANION GAP: 10 (ref 5–15)
BUN: 17 mg/dL (ref 6–20)
CALCIUM: 9 mg/dL (ref 8.9–10.3)
CHLORIDE: 103 mmol/L (ref 101–111)
CO2: 21 mmol/L — AB (ref 22–32)
Creatinine, Ser: 1.35 mg/dL — ABNORMAL HIGH (ref 0.61–1.24)
GFR calc non Af Amer: 58 mL/min — ABNORMAL LOW (ref >60)
Glucose, Bld: 115 mg/dL — ABNORMAL HIGH (ref 65–99)
Potassium: 4.4 mmol/L (ref 3.5–5.1)
SODIUM: 134 mmol/L — AB (ref 135–145)

## 2017-02-17 LAB — HIV ANTIBODY (ROUTINE TESTING W REFLEX): HIV Screen 4th Generation wRfx: NONREACTIVE

## 2017-02-17 LAB — C DIFFICILE QUICK SCREEN W PCR REFLEX
C Diff antigen: NEGATIVE
C Diff interpretation: NOT DETECTED
C Diff toxin: NEGATIVE

## 2017-02-17 MED ORDER — ASPIRIN EC 81 MG PO TBEC
81.0000 mg | DELAYED_RELEASE_TABLET | Freq: Every day | ORAL | Status: DC
  Administered 2017-02-17 – 2017-02-18 (×2): 81 mg via ORAL
  Filled 2017-02-17 (×2): qty 1

## 2017-02-17 MED ORDER — OSELTAMIVIR PHOSPHATE 30 MG PO CAPS
30.0000 mg | ORAL_CAPSULE | Freq: Two times a day (BID) | ORAL | Status: DC
  Administered 2017-02-17 – 2017-02-18 (×3): 30 mg via ORAL
  Filled 2017-02-17 (×3): qty 1

## 2017-02-17 MED ORDER — URELLE 81 MG PO TABS
1.0000 | ORAL_TABLET | Freq: Four times a day (QID) | ORAL | Status: DC
  Administered 2017-02-17 – 2017-02-18 (×3): 81 mg via ORAL
  Filled 2017-02-17 (×5): qty 1

## 2017-02-17 MED ORDER — FLAVOXATE HCL 100 MG PO TABS: 200.0000 mg | ORAL_TABLET | Freq: Three times a day (TID) | ORAL | Status: DC

## 2017-02-17 NOTE — Progress Notes (Signed)
The patient is complaining of pain with urination unrelieved by the pyridium. MD notified.

## 2017-02-17 NOTE — Progress Notes (Signed)
[facility]                                                                              Patient Demographics  [name], is a 56 y.o. male, DOB - 07-03-1961, [id number]  Admit date - 02/16/2017   Admitting Physician Elwin Mocha, MD  Outpatient Primary MD for the patient is Gennette Pac, MD  Outpatient specialists:   LOS - 0  days    Chief Complaint  Patient presents with  . Abdominal Pain  . Urinary Frequency       Brief summary    [name] is a 56 y.o. male with medical history significant for PAF maintaining sinus rhythm, hypertension, chronic kidney disease status post renal transplant on chronic immunosuppression, known bladder stones, history of CAD and prior CABG, previous stroke, and hypertrophic obstructive cardiomyopathy. Patient reported generalized malaise with cough for about 1-1/2 weeks. He reports he traveled during the same time period to [place] for a vacation. This past Tuesday he underwent cystoscopy at the urologist office had confirmed bladder stones with later plans for removal of the stones. In the past 24 hours he developed significant dysuria without hematuria and urinary frequency and low grade fevers.    Assessment & Plan    Principal Problem: SIRS (systemic inflammatory response syndrome) Secondary to UTI, influenza B -Patient presents with generalized malaise 1-1/2 weeks with development of dysuria and urinary frequency after cystoscopy at the urology office on 3/13 - Continue IV fluid hydration, IV Rocephin, follow urine culture and sensitivities - Influenza panel was positive for influenza B, started on Tamiflu  Active Problems: Influenza B -  Started on Tamiflu for 5 days    Renal transplant recipient/Immunosuppressed status  -As above -Continue preadmission prednisolone, Myfortic and Prograf    UTI (urinary tract infection)/Thrombocytopenia/bladder calculi -Recent instrumentation/cystoscopy at  urologist's office this past Tuesday. EDP discussed with Dr. Noah Delaine of urology who recommended antibiotics and follow cultures. - Continue IV Rocephin. - Pyridium for bladder spasms    HTN (hypertension) -Continue catapres, Cardizem and labetalol    acute on  CKD (chronic kidney disease), stage III -Creatinine function improving, admitted with recurrent of 1.6, improving to 1.3 today  - No evidence of hydronephrosis on CT scan    PAF and Aflutter -Currently in a regular rhythm -Continue beta blocker and calcium channel blocker -Not chronically anticoagulated    S/P CABG (coronary artery bypass graft) -Currently asymptomatic -Continue aspirin, beta blocker, statin as well as omega-3 fatty acids and fenofibrate    HOCM (hypertrophic obstructive cardiomyopathy)  -Currently no signs of heart failure -Cautious administration of IV fluids   Code Status: Full CODE STATUS DVT Prophylaxis:  Lovenox  Family Communication: Discussed in detail with the patient, all imaging results, lab results explained to the patient   Disposition Plan:   Time Spent in minutes   25 minutes  Procedures:    Consultants:     Antimicrobials:   IV Rocephin  Tamiflu   Medications  Scheduled Meds: . allopurinol  200 mg Oral BH-q7a  . aspirin EC  81 mg Oral QHS  . cefTRIAXone (ROCEPHIN)  IV  1 g Intravenous Q24H  . cloNIDine  0.2 mg Oral QPM  . enoxaparin (LOVENOX) injection  40 mg Subcutaneous Q24H  . fenofibrate  54 mg Oral Daily  . labetalol  200 mg Oral BID  . magnesium oxide  400 mg Oral Daily  . mycophenolate  720 mg Oral BID  . omega-3 acid ethyl esters  2 g Oral BID  . oseltamivir  30 mg Oral BID  . pantoprazole  40 mg Oral Daily  . phenazopyridine  200 mg Oral TID WC  . prednisoLONE  5 mg Oral QHS  . simvastatin  40 mg Oral QHS  . tacrolimus  1 mg Oral BID  . Vitamin D (Ergocalciferol)  50,000 Units Oral Once per day on Mon Thu   Continuous Infusions: . sodium chloride  50 mL/hr at 02/17/17 1105   PRN Meds:.acetaminophen **OR** acetaminophen, diltiazem, ondansetron **OR** ondansetron (ZOFRAN) IV, oxyCODONE   Antibiotics   Anti-infectives    Start     Dose/Rate Route Frequency Ordered Stop   02/17/17 1000  oseltamivir (TAMIFLU) capsule 30 mg     30 mg Oral 2 times daily 02/17/17 0624 02/22/17 0959   02/17/17 0800  cefTRIAXone (ROCEPHIN) 1 g in dextrose 5 % 50 mL IVPB     1 g 100 mL/hr over 30 Minutes Intravenous Every 24 hours 02/16/17 1832     02/16/17 1330  cefTRIAXone (ROCEPHIN) 1 g in dextrose 5 % 50 mL IVPB     1 g 100 mL/hr over 30 Minutes Intravenous  Once 02/16/17 1329 02/16/17 1539        Subjective:   [name] was seen and examined today.  Feeling slightly better this morning, dysuria is improving, still spiking low-grade fever overnight 99.90F. Slight coughing. Patient denies dizziness, chest pain, shortness of breath, new weakness, numbess, tingling. No acute events overnight.    Objective:   Vitals:   02/16/17 1700 02/16/17 2026 02/17/17 0523 02/17/17 0959  BP: 130/87 123/77 126/79 115/68  Pulse: 69 (!) 58 90 77  Resp: (!) 27 17 16 16   Temp:  99.7 F (37.6 C) 98.2 F (36.8 C) 98.6 F (37 C)  TempSrc:    Oral  SpO2: 97% 96% 96% 97%  Weight:  85 kg (187 lb 6.3 oz)    Height:        Intake/Output Summary (Last 24 hours) at 02/17/17 1212 Last data filed at 02/17/17 1129  Gross per 24 hour  Intake          2144.17 ml  Output              100 ml  Net          2044.17 ml     Wt Readings from Last 3 Encounters:  02/16/17 85 kg (187 lb 6.3 oz)  01/08/17 87.9 kg (193 lb 12.8 oz)  11/30/16 85.3 kg (188 lb)     Exam  General: Alert and oriented x 3, NAD  HEENT:    Neck: Supple, no JVD, no masses  Cardiovascular: S1 S2 auscultated, no rubs, murmurs or gallops. Regular rate and rhythm.  Respiratory: Clear to auscultation bilaterally, no wheezing, rales or rhonchi  Gastrointestinal: Soft, mild tenderness in the  suprapubic region, non distended, + bowel sounds  Ext: no cyanosis clubbing or edema  Neuro: AAOx3, Cr N's II- XII. Strength 5/5 upper and lower extremities bilaterally  Skin: No rashes  Psych: Normal affect and demeanor, alert and oriented x3  Data Reviewed:  I have personally reviewed following labs and imaging studies  Micro Results Recent Results (from the past 240 hour(s))  Urine culture     Status: Abnormal   Collection Time: 02/16/17 12:26 PM  Result Value Ref Range Status   Specimen Description URINE, CLEAN CATCH  Final   Special Requests NONE  Final   Culture MULTIPLE SPECIES PRESENT, SUGGEST RECOLLECTION (A)  Final   Report Status 02/17/2017 FINAL  Final    Radiology Reports Ct Abdomen Pelvis Wo Contrast  Result Date: 02/16/2017 CLINICAL DATA:  Previous renal transplant. Abdominal pain. Headache. Difficulty urinating. EXAM: CT ABDOMEN AND PELVIS WITHOUT CONTRAST TECHNIQUE: Multidetector CT imaging of the abdomen and pelvis was performed following the standard protocol without IV contrast. COMPARISON:  05/29/2016 FINDINGS: Lower chest: Lung bases are clear. No pleural or pericardial fluid. The heart is enlarged. Hepatobiliary: Liver parenchyma appears normal without contrast. No calcified gallstones. Pancreas: Normal Spleen: Normal Adrenals/Urinary Tract: Adrenal glands are normal. Native right kidney is markedly atrophic. Native left kidney is atrophic, with 2 cysts at the upper pole that are stable, the larger measuring 3.4 cm in diameter and the smaller measuring 1.7 cm in diameter. Transplant kidney in the right iliac fossa appears unremarkable by CT. No hydronephrosis. No stone disease. Bladder appears normal. Stomach/Bowel: No acute bowel finding. Previous abdominal mesh. Appendix appears normal. Vascular/Lymphatic: Aortic atherosclerosis. Iliac atherosclerosis. No aneurysm. IVC is normal. No retroperitoneal adenopathy. Reproductive: Negative Other: No free fluid or air.  Musculoskeletal: Normal IMPRESSION: No acute finding by CT. Right iliac fossa transplant kidney appears unremarkable. No hydronephrosis. Atrophic native kidneys as seen previously with benign appearing renal cysts on the left. Aortic atherosclerosis. Iliac atherosclerosis. No signs of mesenteric ischemia on this exam. Electronically Signed   By: Nelson Chimes M.D.   On: 02/16/2017 14:22   Dg Chest 2 View  Result Date: 02/16/2017 CLINICAL DATA:  Productive cough for 2-3 weeks, high WBC. Hx of CABG(2013), kidney transplant(2014), hypertension, CAD, dysrhythmia, hyperparathyroidism, PAF and flutter, stroke, cardiac catheterization, AV fistula placement(2011). Former smoker. EXAM: CHEST  2 VIEW COMPARISON:  11/30/2016 FINDINGS: Prior CABG. Atherosclerotic calcification of the aortic arch. Mild enlargement of the cardiopericardial silhouette. No pulmonary edema.  No pleural effusion.  The lungs appear clear. IMPRESSION: 1. Mild enlargement of the cardiopericardial silhouette with prior CABG. No edema or additional findings. Electronically Signed   By: Van Clines M.D.   On: 02/16/2017 16:45    Lab Data:  CBC:  Recent Labs Lab 02/16/17 1201 02/17/17 0536  WBC 17.1* 13.3*  NEUTROABS 14.8*  --   HGB 15.8 14.9  HCT 47.7 45.8  MCV 81.1 81.5  PLT 137* 518   Basic Metabolic Panel:  Recent Labs Lab 02/16/17 1201 02/17/17 0536  NA 132* 134*  K 3.8 4.4  CL 102 103  CO2 21* 21*  GLUCOSE 116* 115*  BUN 18 17  CREATININE 1.63* 1.35*  CALCIUM 8.7* 9.0   GFR: Estimated Creatinine Clearance: 67.1 mL/min (A) (by C-G formula based on SCr of 1.35 mg/dL (H)). Liver Function Tests: No results for input(s): AST, ALT, ALKPHOS, BILITOT, PROT, ALBUMIN in the last 168 hours. No results for input(s): LIPASE, AMYLASE in the last 168 hours. No results for input(s): AMMONIA in the last 168 hours. Coagulation Profile: No results for input(s): INR, PROTIME in the last 168 hours. Cardiac Enzymes: No  results for input(s): CKTOTAL, CKMB, CKMBINDEX, TROPONINI in the last 168 hours. BNP (last 3 results) No results for input(s): PROBNP  in the last 8760 hours. HbA1C: No results for input(s): HGBA1C in the last 72 hours. CBG: No results for input(s): GLUCAP in the last 168 hours. Lipid Profile: No results for input(s): CHOL, HDL, LDLCALC, TRIG, CHOLHDL, LDLDIRECT in the last 72 hours. Thyroid Function Tests: No results for input(s): TSH, T4TOTAL, FREET4, T3FREE, THYROIDAB in the last 72 hours. Anemia Panel: No results for input(s): VITAMINB12, FOLATE, FERRITIN, TIBC, IRON, RETICCTPCT in the last 72 hours. Urine analysis:    Component Value Date/Time   COLORURINE AMBER (A) 02/16/2017 1132   APPEARANCEUR HAZY (A) 02/16/2017 1132   LABSPEC 1.028 02/16/2017 1132   PHURINE 5.0 02/16/2017 1132   GLUCOSEU NEGATIVE 02/16/2017 1132   HGBUR MODERATE (A) 02/16/2017 1132   BILIRUBINUR NEGATIVE 02/16/2017 1132   KETONESUR NEGATIVE 02/16/2017 1132   PROTEINUR 100 (A) 02/16/2017 1132   UROBILINOGEN 1.0 11/21/2014 1329   NITRITE NEGATIVE 02/16/2017 1132   LEUKOCYTESUR SMALL (A) 02/16/2017 1132     Aadi Bordner M.D. [facility] 02/17/2017, 12:12 PM  Pager: [phone] Between 7am to 7pm - call Pager - [phone]  After 7pm go to [email or web address] - password [email or web address]  Call night coverage person covering after 7pm

## 2017-02-17 NOTE — Consult Note (Signed)
   [facility] CM Inpatient Consult   02/17/2017  [name] 13-Jun-1961 [id number]   Went to bedside to speak with [name] on behalf of [place] to Wellness program for [employer] employees/dependents with [facility] insurance. Discussed the program and provided Link to [employer] and contact information. Denies having Link to Wellness needs at this time. Confirmed best contact number as [phone] for post discharge call. Made inpatient RNCM aware of visit.   Marthenia Rolling, MSN-Ed, RN,BSN [facility] Liaison [phone]

## 2017-02-18 LAB — CBC
HCT: 46.4 % (ref 39.0–52.0)
HEMOGLOBIN: 14.9 g/dL (ref 13.0–17.0)
MCH: 26.2 pg (ref 26.0–34.0)
MCHC: 32.1 g/dL (ref 30.0–36.0)
MCV: 81.7 fL (ref 78.0–100.0)
Platelets: 174 10*3/uL (ref 150–400)
RBC: 5.68 MIL/uL (ref 4.22–5.81)
RDW: 15.1 % (ref 11.5–15.5)
WBC: 9.7 10*3/uL (ref 4.0–10.5)

## 2017-02-18 LAB — BASIC METABOLIC PANEL
ANION GAP: 10 (ref 5–15)
BUN: 16 mg/dL (ref 6–20)
CALCIUM: 9.1 mg/dL (ref 8.9–10.3)
CO2: 23 mmol/L (ref 22–32)
Chloride: 103 mmol/L (ref 101–111)
Creatinine, Ser: 1.29 mg/dL — ABNORMAL HIGH (ref 0.61–1.24)
GFR calc Af Amer: >60 mL/min (ref >60)
GLUCOSE: 131 mg/dL — AB (ref 65–99)
Potassium: 4.3 mmol/L (ref 3.5–5.1)
SODIUM: 136 mmol/L (ref 135–145)

## 2017-02-18 LAB — URINE CULTURE: Culture: NO GROWTH

## 2017-02-18 LAB — PROCALCITONIN

## 2017-02-18 MED ORDER — OSELTAMIVIR PHOSPHATE 75 MG PO CAPS
75.0000 mg | ORAL_CAPSULE | Freq: Two times a day (BID) | ORAL | Status: DC
  Administered 2017-02-18 – 2017-02-19 (×2): 75 mg via ORAL
  Filled 2017-02-18 (×2): qty 1

## 2017-02-18 MED ORDER — URELLE 81 MG PO TABS
1.0000 | ORAL_TABLET | Freq: Four times a day (QID) | ORAL | Status: DC
Start: 2017-02-18 — End: 2017-02-19
  Administered 2017-02-18 – 2017-02-19 (×4): 81 mg via ORAL
  Filled 2017-02-18 (×5): qty 1

## 2017-02-18 NOTE — Progress Notes (Signed)
[facility]                                                                              Patient Demographics  Quandre Haley, is a 56 y.o. male, DOB - 06/20/1961, KNL:[id number]  Admit date - 02/16/2017   Admitting Physician Elwin Mocha, MD  Outpatient Primary MD for the patient is Gennette Pac, MD  Outpatient specialists:   LOS - 1  days    Chief Complaint  Patient presents with  . Abdominal Pain  . Urinary Frequency       Brief summary    [name] is a 56 y.o. male with medical history significant for PAF maintaining sinus rhythm, hypertension, chronic kidney disease status post renal transplant on chronic immunosuppression, known bladder stones, history of CAD and prior CABG, previous stroke, and hypertrophic obstructive cardiomyopathy. Patient reported generalized malaise with cough for about 1-1/2 weeks. He reports he traveled during the same time period to [place] for a vacation. This past Tuesday he underwent cystoscopy at the urologist office had confirmed bladder stones with later plans for removal of the stones. In the past 24 hours he developed significant dysuria without hematuria and urinary frequency and low grade fevers.    Assessment & Plan    Principal Problem: SIRS (systemic inflammatory response syndrome) Secondary to UTI, influenza B -Patient presents with generalized malaise 1-1/2 weeks with development of dysuria and urinary frequency after cystoscopy at the urology office on 3/13 - Continue IV fluid hydration, IV Rocephin, follow urine culture and sensitivities - Influenza panel was positive for influenza B, started on Tamiflu - C. difficile negative  Active Problems: Influenza B - overall starting to improve today, Started on Tamiflu for 5 days    Renal transplant recipient/Immunosuppressed status  -As above -Continue preadmission prednisolone, Myfortic and Prograf    UTI (urinary tract  infection)/Thrombocytopenia/bladder calculi -Recent instrumentation/cystoscopy at urologist's office this past Tuesday. EDP discussed with Dr. Noah Delaine of urology who recommended antibiotics and follow cultures. - Continue IV Rocephin. -placed on urelle for bladder spasms    HTN (hypertension) -Continue catapres, Cardizem and labetalol    acute on  CKD (chronic kidney disease), stage III -Creatinine function improving, admitted with recurrent of 1.6, improving to 1.3 today  - No evidence of hydronephrosis on CT scan    PAF and Aflutter -Currently in a regular rhythm -Continue beta blocker and calcium channel blocker -Not chronically anticoagulated    S/P CABG (coronary artery bypass graft) -Currently asymptomatic -Continue aspirin, beta blocker, statin as well as omega-3 fatty acids and fenofibrate    HOCM (hypertrophic obstructive cardiomyopathy)  -Currently no signs of heart failure -Cautious administration of IV fluids   Code Status: Full CODE STATUS DVT Prophylaxis:  Lovenox  Family Communication: Discussed in detail with the patient, all imaging results, lab results explained to the patient   Disposition Plan: Possible DC in am  Time Spent in minutes   25 minutes  Procedures:    Consultants:     Antimicrobials:   IV Rocephin  Tamiflu   Medications  Scheduled Meds: . allopurinol  200 mg Oral BH-q7a  .  aspirin EC  81 mg Oral QHS  . cefTRIAXone (ROCEPHIN)  IV  1 g Intravenous Q24H  . cloNIDine  0.2 mg Oral QPM  . enoxaparin (LOVENOX) injection  40 mg Subcutaneous Q24H  . fenofibrate  54 mg Oral Daily  . labetalol  200 mg Oral BID  . magnesium oxide  400 mg Oral Daily  . mycophenolate  720 mg Oral BID  . omega-3 acid ethyl esters  2 g Oral BID  . oseltamivir  75 mg Oral BID  . pantoprazole  40 mg Oral Daily  . prednisoLONE  5 mg Oral QHS  . simvastatin  40 mg Oral QHS  . tacrolimus  1 mg Oral BID  . URELLE  1 tablet Oral QID  . Vitamin D  (Ergocalciferol)  50,000 Units Oral Once per day on Mon Thu   Continuous Infusions: . sodium chloride 50 mL/hr at 02/18/17 0211   PRN Meds:.acetaminophen **OR** acetaminophen, diltiazem, ondansetron **OR** ondansetron (ZOFRAN) IV, oxyCODONE   Antibiotics   Anti-infectives    Start     Dose/Rate Route Frequency Ordered Stop   02/18/17 2200  oseltamivir (TAMIFLU) capsule 75 mg     75 mg Oral 2 times daily 02/18/17 1130 02/22/17 0959   02/17/17 1000  oseltamivir (TAMIFLU) capsule 30 mg  Status:  Discontinued     30 mg Oral 2 times daily 02/17/17 0624 02/18/17 1130   02/17/17 0800  cefTRIAXone (ROCEPHIN) 1 g in dextrose 5 % 50 mL IVPB     1 g 100 mL/hr over 30 Minutes Intravenous Every 24 hours 02/16/17 1832     02/16/17 1330  cefTRIAXone (ROCEPHIN) 1 g in dextrose 5 % 50 mL IVPB     1 g 100 mL/hr over 30 Minutes Intravenous  Once 02/16/17 1329 02/16/17 1539        Subjective:   Forrest Dittman was seen and examined today. Feeling slightly better today, no fevers this morning. Dysuria and bladder spasms have started to improve this morning. Still coughing but improving.  Patient denies dizziness, chest pain, shortness of breath, new weakness, numbess, tingling. No acute events overnight.    Objective:   Vitals:   02/17/17 1724 02/17/17 2223 02/18/17 0500 02/18/17 1040  BP: 131/89 130/83 132/80 123/69  Pulse:  66 62 64  Resp:  18 18 18   Temp:  98.1 F (36.7 C) 98 F (36.7 C) 98.7 F (37.1 C)  TempSrc:  Oral Oral   SpO2:  97% 97% 98%  Weight:  86.2 kg (190 lb 0.6 oz)    Height:        Intake/Output Summary (Last 24 hours) at 02/18/17 1249 Last data filed at 02/18/17 0759  Gross per 24 hour  Intake              480 ml  Output              750 ml  Net             -270 ml     Wt Readings from Last 3 Encounters:  02/17/17 86.2 kg (190 lb 0.6 oz)  01/08/17 87.9 kg (193 lb 12.8 oz)  11/30/16 85.3 kg (188 lb)     Exam  General: Alert and oriented x 3, NAD  HEENT:      Neck: Supple, no JVD  Cardiovascular: S1 S2 clear, RRR  Respiratory: CTAB  Gastrointestinal: Soft, mild tenderness in the suprapubic region, non distended, + bowel sounds  Ext: no cyanosis clubbing  or edema  Neuro: No new deficits  Skin: No rashes  Psych: Normal affect and demeanor, alert and oriented x3    Data Reviewed:  I have personally reviewed following labs and imaging studies  Micro Results Recent Results (from the past 240 hour(s))  Urine culture     Status: Abnormal   Collection Time: 02/16/17 12:26 PM  Result Value Ref Range Status   Specimen Description URINE, CLEAN CATCH  Final   Special Requests NONE  Final   Culture MULTIPLE SPECIES PRESENT, SUGGEST RECOLLECTION (A)  Final   Report Status 02/17/2017 FINAL  Final  Blood culture (routine x 2)     Status: None (Preliminary result)   Collection Time: 02/16/17  2:16 PM  Result Value Ref Range Status   Specimen Description BLOOD RIGHT ANTECUBITAL  Final   Special Requests AEROBIC BOTTLE ONLY 10CC  Final   Culture NO GROWTH 1 DAY  Final   Report Status PENDING  Incomplete  Blood culture (routine x 2)     Status: None (Preliminary result)   Collection Time: 02/16/17  2:18 PM  Result Value Ref Range Status   Specimen Description BLOOD RIGHT HAND  Final   Special Requests AEROBIC BOTTLE ONLY 5CC  Final   Culture NO GROWTH 1 DAY  Final   Report Status PENDING  Incomplete  C difficile quick scan w PCR reflex     Status: None   Collection Time: 02/16/17  8:21 PM  Result Value Ref Range Status   C Diff antigen NEGATIVE NEGATIVE Final   C Diff toxin NEGATIVE NEGATIVE Final   C Diff interpretation No C. difficile detected.  Final    Radiology Reports Ct Abdomen Pelvis Wo Contrast  Result Date: 02/16/2017 CLINICAL DATA:  Previous renal transplant. Abdominal pain. Headache. Difficulty urinating. EXAM: CT ABDOMEN AND PELVIS WITHOUT CONTRAST TECHNIQUE: Multidetector CT imaging of the abdomen and pelvis was  performed following the standard protocol without IV contrast. COMPARISON:  05/29/2016 FINDINGS: Lower chest: Lung bases are clear. No pleural or pericardial fluid. The heart is enlarged. Hepatobiliary: Liver parenchyma appears normal without contrast. No calcified gallstones. Pancreas: Normal Spleen: Normal Adrenals/Urinary Tract: Adrenal glands are normal. Native right kidney is markedly atrophic. Native left kidney is atrophic, with 2 cysts at the upper pole that are stable, the larger measuring 3.4 cm in diameter and the smaller measuring 1.7 cm in diameter. Transplant kidney in the right iliac fossa appears unremarkable by CT. No hydronephrosis. No stone disease. Bladder appears normal. Stomach/Bowel: No acute bowel finding. Previous abdominal mesh. Appendix appears normal. Vascular/Lymphatic: Aortic atherosclerosis. Iliac atherosclerosis. No aneurysm. IVC is normal. No retroperitoneal adenopathy. Reproductive: Negative Other: No free fluid or air. Musculoskeletal: Normal IMPRESSION: No acute finding by CT. Right iliac fossa transplant kidney appears unremarkable. No hydronephrosis. Atrophic native kidneys as seen previously with benign appearing renal cysts on the left. Aortic atherosclerosis. Iliac atherosclerosis. No signs of mesenteric ischemia on this exam. Electronically Signed   By: Nelson Chimes M.D.   On: 02/16/2017 14:22   Dg Chest 2 View  Result Date: 02/16/2017 CLINICAL DATA:  Productive cough for 2-3 weeks, high WBC. Hx of CABG(2013), kidney transplant(2014), hypertension, CAD, dysrhythmia, hyperparathyroidism, PAF and flutter, stroke, cardiac catheterization, AV fistula placement(2011). Former smoker. EXAM: CHEST  2 VIEW COMPARISON:  11/30/2016 FINDINGS: Prior CABG. Atherosclerotic calcification of the aortic arch. Mild enlargement of the cardiopericardial silhouette. No pulmonary edema.  No pleural effusion.  The lungs appear clear. IMPRESSION: 1. Mild enlargement of the cardiopericardial  silhouette with prior CABG. No edema or additional findings. Electronically Signed   By: Van Clines M.D.   On: 02/16/2017 16:45    Lab Data:  CBC:  Recent Labs Lab 02/16/17 1201 02/17/17 0536 02/18/17 0512  WBC 17.1* 13.3* 9.7  NEUTROABS 14.8*  --   --   HGB 15.8 14.9 14.9  HCT 47.7 45.8 46.4  MCV 81.1 81.5 81.7  PLT 137* 156 195   Basic Metabolic Panel:  Recent Labs Lab 02/16/17 1201 02/17/17 0536 02/18/17 0512  NA 132* 134* 136  K 3.8 4.4 4.3  CL 102 103 103  CO2 21* 21* 23  GLUCOSE 116* 115* 131*  BUN 18 17 16   CREATININE 1.63* 1.35* 1.29*  CALCIUM 8.7* 9.0 9.1   GFR: Estimated Creatinine Clearance: 70.7 mL/min (A) (by C-G formula based on SCr of 1.29 mg/dL (H)). Liver Function Tests: No results for input(s): AST, ALT, ALKPHOS, BILITOT, PROT, ALBUMIN in the last 168 hours. No results for input(s): LIPASE, AMYLASE in the last 168 hours. No results for input(s): AMMONIA in the last 168 hours. Coagulation Profile: No results for input(s): INR, PROTIME in the last 168 hours. Cardiac Enzymes: No results for input(s): CKTOTAL, CKMB, CKMBINDEX, TROPONINI in the last 168 hours. BNP (last 3 results) No results for input(s): PROBNP in the last 8760 hours. HbA1C: No results for input(s): HGBA1C in the last 72 hours. CBG: No results for input(s): GLUCAP in the last 168 hours. Lipid Profile: No results for input(s): CHOL, HDL, LDLCALC, TRIG, CHOLHDL, LDLDIRECT in the last 72 hours. Thyroid Function Tests: No results for input(s): TSH, T4TOTAL, FREET4, T3FREE, THYROIDAB in the last 72 hours. Anemia Panel: No results for input(s): VITAMINB12, FOLATE, FERRITIN, TIBC, IRON, RETICCTPCT in the last 72 hours. Urine analysis:    Component Value Date/Time   COLORURINE AMBER (A) 02/16/2017 1132   APPEARANCEUR HAZY (A) 02/16/2017 1132   LABSPEC 1.028 02/16/2017 1132   PHURINE 5.0 02/16/2017 1132   GLUCOSEU NEGATIVE 02/16/2017 1132   HGBUR MODERATE (A) 02/16/2017  1132   BILIRUBINUR NEGATIVE 02/16/2017 1132   KETONESUR NEGATIVE 02/16/2017 1132   PROTEINUR 100 (A) 02/16/2017 1132   UROBILINOGEN 1.0 11/21/2014 1329   NITRITE NEGATIVE 02/16/2017 1132   LEUKOCYTESUR SMALL (A) 02/16/2017 1132     Wali Reinheimer M.D. [facility] 02/18/2017, 12:49 PM  Pager: [phone] Between 7am to 7pm - call Pager - [phone]  After 7pm go to [email or web address] - password [email or web address]  Call night coverage person covering after 7pm

## 2017-02-18 NOTE — Progress Notes (Signed)
Offered Pt a bath. Pt stated he is waiting on his wife to bring him some things and he does not need any assistance with bath. Tech gave Pt all bath supplies.

## 2017-02-18 NOTE — Progress Notes (Signed)
PHARMACY NOTE:  ANTIMICROBIAL RENAL DOSAGE ADJUSTMENT  Current antimicrobial regimen includes a mismatch between antimicrobial dosage and estimated renal function.  As per policy approved by the Pharmacy & Therapeutics and Medical Executive Committees, the antimicrobial dosage will be adjusted accordingly.  Current antimicrobial dosage:  Tamiflu 30mg  BID (has received 3 doses)  Indication: Flu B positive  Renal Function:  Estimated Creatinine Clearance: 70.7 mL/min (A) (by C-G formula based on SCr of 1.29 mg/dL (H)). []      On intermittent HD, scheduled: []      On CRRT    Antimicrobial dosage has been changed to:  Tamiflu 75mg  BID x7 more doses  Additional comments: Patient is on Bactrim 1SS tab qMWF for prophylaxis due to immunosuppressive medications- please consider restarting while he is in the hospital.   Thank you for allowing pharmacy to be a part of this patient's care.  Verna Czech, [facility] 02/18/2017 11:31 AM

## 2017-02-18 NOTE — Care Management Note (Signed)
Case Management Note  Patient Details  Name: [name] MRN: [id number] Date of Birth: 06/24/1961  Subjective/Objective:            CM following for progression and d/c planning.         Action/Plan: 02/18/2017 No d/c needs identified at this time.   Expected Discharge Date:                  Expected Discharge Plan:  Home/Self Care  In-House Referral:  NA  Discharge planning Services  NA  Post Acute Care Choice:  NA Choice offered to:  NA  DME Arranged:    DME Agency:     HH Arranged:    HH Agency:     Status of Service:  In process, will continue to follow  If discussed at Long Length of Stay Meetings, dates discussed:    Additional Comments:  Adron Bene, RN 02/18/2017, 1:51 PM

## 2017-02-19 DIAGNOSIS — J101 Influenza due to other identified influenza virus with other respiratory manifestations: Principal | ICD-10-CM

## 2017-02-19 LAB — CBC
HCT: 46.2 % (ref 39.0–52.0)
Hemoglobin: 14.7 g/dL (ref 13.0–17.0)
MCH: 25.8 pg — ABNORMAL LOW (ref 26.0–34.0)
MCHC: 31.8 g/dL (ref 30.0–36.0)
MCV: 81.1 fL (ref 78.0–100.0)
PLATELETS: 192 10*3/uL (ref 150–400)
RBC: 5.7 MIL/uL (ref 4.22–5.81)
RDW: 14.5 % (ref 11.5–15.5)
WBC: 9.4 10*3/uL (ref 4.0–10.5)

## 2017-02-19 LAB — BASIC METABOLIC PANEL
Anion gap: 8 (ref 5–15)
BUN: 14 mg/dL (ref 6–20)
CALCIUM: 9.6 mg/dL (ref 8.9–10.3)
CO2: 24 mmol/L (ref 22–32)
CREATININE: 1.27 mg/dL — AB (ref 0.61–1.24)
Chloride: 106 mmol/L (ref 101–111)
GFR calc Af Amer: >60 mL/min (ref >60)
Glucose, Bld: 115 mg/dL — ABNORMAL HIGH (ref 65–99)
Potassium: 4.4 mmol/L (ref 3.5–5.1)
SODIUM: 138 mmol/L (ref 135–145)

## 2017-02-19 MED ORDER — PREDNISONE 5 MG PO TABS: 5.0000 mg | ORAL_TABLET | Freq: Every day | ORAL | Status: DC

## 2017-02-19 MED ORDER — OSELTAMIVIR PHOSPHATE 75 MG PO CAPS: 75.0000 mg | ORAL_CAPSULE | Freq: Two times a day (BID) | ORAL | 0 refills | Status: DC

## 2017-02-19 MED FILL — OSELTAMIVIR PHOSPHATE 75 MG: 75 | 4 days supply | Qty: 7 | Fill #0

## 2017-02-19 NOTE — Progress Notes (Signed)
[name] to be D/C'd Home per MD order.  Discussed prescriptions and follow up appointments with the patient. Prescriptions given to patient, medication list explained in detail. Pt verbalized understanding.  Allergies as of 02/19/2017      Reactions   Contrast Media [iodinated Diagnostic Agents]    coded   Penicillins Itching, Rash   Has patient had a PCN reaction causing immediate rash, facial/tongue/throat swelling, SOB or lightheadedness with hypotension: Yes Has patient had a PCN reaction causing severe rash involving mucus membranes or skin necrosis: No Has patient had a PCN reaction that required hospitalization No Has patient had a PCN reaction occurring within the last 10 years: No If all of the above answers are "NO", then may proceed with Cephalosporin use.      Medication List    TAKE these medications   allopurinol 100 MG tablet Commonly known as:  ZYLOPRIM Take 200 mg by mouth every morning.   aspirin 81 MG EC tablet Take 1 tablet (81 mg total) by mouth daily.   CIALIS 5 MG tablet Generic drug:  tadalafil Take 5 mg by mouth daily as needed for erectile dysfunction.   cloNIDine 0.2 MG tablet Commonly known as:  CATAPRES Take 0.2 mg by mouth every evening.   diltiazem 60 MG tablet Commonly known as:  CARDIZEM Take 1 tablet (60 mg total) by mouth as needed (for palpitations).   fenofibrate micronized 134 MG capsule Commonly known as:  LOFIBRA Take 134 mg by mouth daily.   labetalol 200 MG tablet Commonly known as:  NORMODYNE Take 1 tablet (200 mg total) by mouth 2 (two) times daily.   LOVAZA 1 g capsule Generic drug:  omega-3 acid ethyl esters Take 2 g by mouth 2 (two) times daily.   Magnesium 400 MG Caps Take 400 mg by mouth daily.   metoprolol 50 MG tablet Commonly known as:  LOPRESSOR Take 50-100 mg by mouth daily as needed (AFIB).   mycophenolate 180 MG EC tablet Commonly known as:  MYFORTIC Take 720 mg by mouth 2 (two) times daily.    omeprazole 20 MG capsule Commonly known as:  PRILOSEC Take 20 mg by mouth daily.   oseltamivir 75 MG capsule Commonly known as:  TAMIFLU Take 1 capsule (75 mg total) by mouth 2 (two) times daily. For 4days   predniSONE 5 MG tablet Commonly known as:  DELTASONE Take 5 mg by mouth at bedtime.   simvastatin 40 MG tablet Commonly known as:  ZOCOR Take 40 mg by mouth at bedtime.   sulfamethoxazole-trimethoprim 400-80 MG tablet Commonly known as:  BACTRIM,SEPTRA Take 1 tablet by mouth every Monday, Wednesday, and Friday.   tacrolimus 1 MG capsule Commonly known as:  PROGRAF Take 1 mg by mouth 2 (two) times daily.   Vitamin D (Ergocalciferol) 50000 units Caps capsule Commonly known as:  DRISDOL Take 50,000 Units by mouth 2 (two) times a week. On Tuesdays and Saturdays       Vitals:   02/19/17 0425 02/19/17 0937  BP: 121/77 121/81  Pulse: (!) 56 (!) 50  Resp: 18 17  Temp: 98.2 F (36.8 C) 98.6 F (37 C)    Skin clean, dry and intact without evidence of skin break down, no evidence of skin tears noted. IV catheter discontinued intact. Site without signs and symptoms of complications. Dressing and pressure applied. Pt denies pain at this time. No complaints noted.  An After Visit Summary was printed and given to the patient. Patient ambulated off  unit, and D/C home via private auto.  Retta Mac BSN, RN

## 2017-02-19 NOTE — Discharge Summary (Signed)
Physician Discharge Summary  [name] CXK:[id number] DOB: 05/12/1961 DOA: 02/16/2017  PCP: Steven Pac, MD  Admit date: 02/16/2017 Discharge date: 02/19/2017  Time spent: 40minutes  Recommendations for Outpatient Follow-up:  1. PCP in 1 week   Discharge Diagnoses:  Principal Problem:   SIRS (systemic inflammatory response syndrome) (HCC)   Tamiflu   HTN (hypertension)   CKD (chronic kidney disease), stage III   S/P CABG (coronary artery bypass graft)   PAF and Aflutter   Renal transplant recipient   Immunosuppressed status ([place])   UTI (urinary tract infection)   HOCM (hypertrophic obstructive cardiomyopathy) ([place])   Thrombocytopenia ([place])   Discharge Condition: stable  Diet recommendation: heart healthy  Filed Weights   02/16/17 2026 02/17/17 2223 02/18/17 2124  Weight: 85 kg (187 lb 6.3 oz) 86.2 kg (190 lb 0.6 oz) 87.1 kg (192 lb 1.6 oz)    History of present illness:  [name] a 56 y.o.malewith medical history significant for PAF maintaining sinus rhythm, hypertension, chronic kidney disease status post renal transplant on chronic immunosuppression, known bladder stones, history of CAD and prior CABG, previous stroke, and hypertrophic obstructive cardiomyopathy. Patient reported generalized malaise with cough for about 1-1/2 weeks.   Hospital Course:   SIRS (systemic inflammatory response syndrome) Secondary to influenza B -Patient presents with generalized malaise 1-1/2 weeks, fevers and he also had some dysuria and urinary frequency after cystoscopy at theurology office on 3/13 -Treated with IVF, Tamiflu, IV Rocephin -urine cx grew polymicrobial flora and hence rocephin stopped after 3days  Influenza B -improved with supportive care, Tamiflu  -continue tamiflu for 45more days  Renal transplant recipient/Immunosuppressed status  -As above -Continue preadmission prednisolone, Myfortic and Prograf  HTN (hypertension) -Continue  catapres, Cardizem and labetalol   acute on CKD (chronic kidney disease), stage III -Creatinine function improving, admitted with recurrent of 1.6, improving to 1.3 today  - No evidence of hydronephrosis on CT scan  H/o PACs and PVCs -Currently in a regular rhythm -Continue beta blocker and calcium channel blocker -followed by Dr.Nishan and Dr.Klein  S/P CABG (coronary artery bypass graft) -Currently asymptomatic -Continue aspirin, beta blocker, statin as well as omega-3 fatty acids and fenofibrate  HOCM (hypertrophic obstructive cardiomyopathy)  -Currently no signs of heart failure   Discharge Exam: Vitals:   02/19/17 0425 02/19/17 0937  BP: 121/77 121/81  Pulse: (!) 56 (!) 50  Resp: 18 17  Temp: 98.2 F (36.8 C) 98.6 F (37 C)    General: AAOx3 Cardiovascular: S1S2/RRR Respiratory: CTAB  Discharge Instructions   Discharge Instructions    AMB Referral to [place] Management    Complete by:  As directed    Please assign UMR member for post discharge call. Currently at [facility]. Please call with questions. Thanks. [name], [place], RN,BSN-THN [facility] [id number]-[phone]   Reason for consult:  Please assign UMR member for post discharge call   Expected date of contact:  1-3 days (reserved for hospital discharges)   Diet - low sodium heart healthy    Complete by:  As directed    Increase activity slowly    Complete by:  As directed      Discharge Medication List as of 02/19/2017 11:13 AM    CONTINUE these medications which have CHANGED   Details  oseltamivir (TAMIFLU) 75 MG capsule Take 1 capsule (75 mg total) by mouth 2 (two) times daily. For 4days, Starting Wed 02/19/2017, Normal      CONTINUE these medications which have  NOT CHANGED   Details  allopurinol (ZYLOPRIM) 100 MG tablet Take 200 mg by mouth every morning. , Historical Med    aspirin 81 MG EC tablet Take 1 tablet (81 mg total) by mouth daily., Starting 10/28/2012,  Until Discontinued, No Print    CIALIS 5 MG tablet Take 5 mg by mouth daily as needed for erectile dysfunction. , Starting Tue 12/12/2015, Historical Med    cloNIDine (CATAPRES) 0.2 MG tablet Take 0.2 mg by mouth every evening. , Historical Med    diltiazem (CARDIZEM) 60 MG tablet Take 1 tablet (60 mg total) by mouth as needed (for palpitations)., Starting Wed 01/08/2017, Normal    fenofibrate micronized (LOFIBRA) 134 MG capsule Take 134 mg by mouth daily. , Historical Med    labetalol (NORMODYNE) 200 MG tablet Take 1 tablet (200 mg total) by mouth 2 (two) times daily., Starting Thu 07/11/2016, Normal    Magnesium 400 MG CAPS Take 400 mg by mouth daily. , Historical Med    mycophenolate (MYFORTIC) 180 MG EC tablet Take 720 mg by mouth 2 (two) times daily. , Historical Med    omega-3 acid ethyl esters (LOVAZA) 1 G capsule Take 2 g by mouth 2 (two) times daily., Until Discontinued, Historical Med    omeprazole (PRILOSEC) 20 MG capsule Take 20 mg by mouth daily., Starting Tue 12/12/2015, Historical Med    predniSONE (DELTASONE) 5 MG tablet Take 5 mg by mouth at bedtime., Historical Med    simvastatin (ZOCOR) 40 MG tablet Take 40 mg by mouth at bedtime. , Until Discontinued, Historical Med    sulfamethoxazole-trimethoprim (BACTRIM,SEPTRA) 400-80 MG tablet Take 1 tablet by mouth every Monday, Wednesday, and Friday., Starting Mon 02/10/2017, Historical Med    tacrolimus (PROGRAF) 1 MG capsule Take 1 mg by mouth 2 (two) times daily., Starting Tue 12/12/2015, Historical Med    Vitamin D, Ergocalciferol, (DRISDOL) 50000 units CAPS capsule Take 50,000 Units by mouth 2 (two) times a week. On Tuesdays and Saturdays, Starting Tue 06/11/2016, Historical Med    metoprolol (LOPRESSOR) 50 MG tablet Take 50-100 mg by mouth daily as needed (AFIB)., Historical Med       Allergies  Allergen Reactions  . Contrast Media [Iodinated Diagnostic Agents]     coded  . Penicillins Itching and Rash    Has patient  had a PCN reaction causing immediate rash, facial/tongue/throat swelling, SOB or lightheadedness with hypotension: Yes Has patient had a PCN reaction causing severe rash involving mucus membranes or skin necrosis: No Has patient had a PCN reaction that required hospitalization No Has patient had a PCN reaction occurring within the last 10 years: No If all of the above answers are "NO", then may proceed with Cephalosporin use.    Follow-up Information    Steven Pac, MD. Schedule an appointment as soon as possible for a visit in 1 week.   Specialty:  Family Medicine Why:  Please call the office once discharged to be re-admitted as a new patient due to the fact that he has not been seen at our office in eight years. Thank you Contact information: [place] [address] [phone]            The results of significant diagnostics from this hospitalization (including imaging, microbiology, ancillary and laboratory) are listed below for reference.    Significant Diagnostic Studies: Ct Abdomen Pelvis Wo Contrast  Result Date: 02/16/2017 CLINICAL DATA:  Previous renal transplant. Abdominal pain. Headache. Difficulty urinating. EXAM: CT ABDOMEN AND PELVIS  WITHOUT CONTRAST TECHNIQUE: Multidetector CT imaging of the abdomen and pelvis was performed following the standard protocol without IV contrast. COMPARISON:  05/29/2016 FINDINGS: Lower chest: Lung bases are clear. No pleural or pericardial fluid. The heart is enlarged. Hepatobiliary: Liver parenchyma appears normal without contrast. No calcified gallstones. Pancreas: Normal Spleen: Normal Adrenals/Urinary Tract: Adrenal glands are normal. Native right kidney is markedly atrophic. Native left kidney is atrophic, with 2 cysts at the upper pole that are stable, the larger measuring 3.4 cm in diameter and the smaller measuring 1.7 cm in diameter. Transplant kidney in the right iliac fossa appears unremarkable by CT. No  hydronephrosis. No stone disease. Bladder appears normal. Stomach/Bowel: No acute bowel finding. Previous abdominal mesh. Appendix appears normal. Vascular/Lymphatic: Aortic atherosclerosis. Iliac atherosclerosis. No aneurysm. IVC is normal. No retroperitoneal adenopathy. Reproductive: Negative Other: No free fluid or air. Musculoskeletal: Normal IMPRESSION: No acute finding by CT. Right iliac fossa transplant kidney appears unremarkable. No hydronephrosis. Atrophic native kidneys as seen previously with benign appearing renal cysts on the left. Aortic atherosclerosis. Iliac atherosclerosis. No signs of mesenteric ischemia on this exam. Electronically Signed   By: Nelson Chimes M.D.   On: 02/16/2017 14:22   Dg Chest 2 View  Result Date: 02/16/2017 CLINICAL DATA:  Productive cough for 2-3 weeks, high WBC. Hx of CABG(2013), kidney transplant(2014), hypertension, CAD, dysrhythmia, hyperparathyroidism, PAF and flutter, stroke, cardiac catheterization, AV fistula placement(2011). Former smoker. EXAM: CHEST  2 VIEW COMPARISON:  11/30/2016 FINDINGS: Prior CABG. Atherosclerotic calcification of the aortic arch. Mild enlargement of the cardiopericardial silhouette. No pulmonary edema.  No pleural effusion.  The lungs appear clear. IMPRESSION: 1. Mild enlargement of the cardiopericardial silhouette with prior CABG. No edema or additional findings. Electronically Signed   By: Van Clines M.D.   On: 02/16/2017 16:45    Microbiology: Recent Results (from the past 240 hour(s))  Urine culture     Status: Abnormal   Collection Time: 02/16/17 12:26 PM  Result Value Ref Range Status   Specimen Description URINE, CLEAN CATCH  Final   Special Requests NONE  Final   Culture MULTIPLE SPECIES PRESENT, SUGGEST RECOLLECTION (A)  Final   Report Status 02/17/2017 FINAL  Final  Blood culture (routine x 2)     Status: None (Preliminary result)   Collection Time: 02/16/17  2:16 PM  Result Value Ref Range Status    Specimen Description BLOOD RIGHT ANTECUBITAL  Final   Special Requests AEROBIC BOTTLE ONLY 10CC  Final   Culture NO GROWTH 3 DAYS  Final   Report Status PENDING  Incomplete  Blood culture (routine x 2)     Status: None (Preliminary result)   Collection Time: 02/16/17  2:18 PM  Result Value Ref Range Status   Specimen Description BLOOD RIGHT HAND  Final   Special Requests AEROBIC BOTTLE ONLY 5CC  Final   Culture NO GROWTH 3 DAYS  Final   Report Status PENDING  Incomplete  C difficile quick scan w PCR reflex     Status: None   Collection Time: 02/16/17  8:21 PM  Result Value Ref Range Status   C Diff antigen NEGATIVE NEGATIVE Final   C Diff toxin NEGATIVE NEGATIVE Final   C Diff interpretation No C. difficile detected.  Final  Urine culture     Status: None   Collection Time: 02/17/17  5:02 PM  Result Value Ref Range Status   Specimen Description URINE, CLEAN CATCH  Final   Special Requests NONE  Final  Culture NO GROWTH  Final   Report Status 02/18/2017 FINAL  Final     Labs: Basic Metabolic Panel:  Recent Labs Lab 02/16/17 1201 02/17/17 0536 02/18/17 0512 02/19/17 0645  NA 132* 134* 136 138  K 3.8 4.4 4.3 4.4  CL 102 103 103 106  CO2 21* 21* 23 24  GLUCOSE 116* 115* 131* 115*  BUN 18 17 16 14   CREATININE 1.63* 1.35* 1.29* 1.27*  CALCIUM 8.7* 9.0 9.1 9.6   Liver Function Tests: No results for input(s): AST, ALT, ALKPHOS, BILITOT, PROT, ALBUMIN in the last 168 hours. No results for input(s): LIPASE, AMYLASE in the last 168 hours. No results for input(s): AMMONIA in the last 168 hours. CBC:  Recent Labs Lab 02/16/17 1201 02/17/17 0536 02/18/17 0512 02/19/17 0645  WBC 17.1* 13.3* 9.7 9.4  NEUTROABS 14.8*  --   --   --   HGB 15.8 14.9 14.9 14.7  HCT 47.7 45.8 46.4 46.2  MCV 81.1 81.5 81.7 81.1  PLT 137* 156 174 192   Cardiac Enzymes: No results for input(s): CKTOTAL, CKMB, CKMBINDEX, TROPONINI in the last 168 hours. BNP: BNP (last 3 results) No results  for input(s): BNP in the last 8760 hours.  ProBNP (last 3 results) No results for input(s): PROBNP in the last 8760 hours.  CBG: No results for input(s): GLUCAP in the last 168 hours.     SignedDomenic Polite MD.  [facility] 02/19/2017, 3:10 PM

## 2017-02-21 LAB — CULTURE, BLOOD (ROUTINE X 2)
Culture: NO GROWTH
Culture: NO GROWTH

## 2017-02-24 ENCOUNTER — Other Ambulatory Visit: Payer: Self-pay | Admitting: *Deleted

## 2017-02-24 ENCOUNTER — Encounter: Payer: Self-pay | Admitting: *Deleted

## 2017-02-24 NOTE — Patient Outreach (Signed)
[place] [facility]) Care Management  02/24/2017  [name] [birth year] [id number]   Subjective: Telephone call to patient's home / mobile number, spoke with patient, and HIPAA verified.   Discussed [facility] Care Management UMR Transition of care follow up, patient voiced understanding, and is in agreement to complete follow up.   Patient he is doing well, spoke with urologist today regarding follow up care, has appointment with nephrologist in April 2018, speaks with cardiologist while at works as needed, and primary MD's office as needed.  States he has a lot of PAL Lawyer) due to employment longevity (has been an Furniture conservator/restorer for 30 years), utilizes [employer] for medications, and does not have the hospital indemnity supplemental plan. Patient states he does not have any transition of care, care coordination, disease management, disease monitoring, transportation, community resource, or pharmacy needs at this time.  States he is very appreciative of the follow up call and is in agreement to receive [place] Management information.   Objective: Per [facility] point of care tool and chart review, patient hospitalized 02/16/17 -02/19/17 for Influenza B.   Had ED visit on  11/30/16 for chest pain.  Patient also has a history of hypertension, UTI, Renal transplant recipient, SIRS (systemic inflammatory response syndrome), HOCM (hypertrophic obstructive cardiomyopathy), and status post CABG (coronary artery bypass graft).     Assessment:  Received UMR Transition of care referral on 02/18/17.   Transition of care follow up completed, no care management needs, and will proceed with case closure.   Plan: RNCM will send patient successful outreach letter, [facility] pamphlet, and magnet. RNCM will send case closure due to follow up completed / no care management needs request to [facility].   Khriz Liddy H. Annia Friendly, BSN, [place] Management [facility] Telephonic  CM Phone: [phone] Fax: [phone]

## 2017-03-01 ENCOUNTER — Observation Stay (HOSPITAL_COMMUNITY)
Admission: EM | Admit: 2017-03-01 | Discharge: 2017-03-02 | Discharge status: Against Medical Advice | Payer: 59 | Attending: Internal Medicine | Admitting: Internal Medicine

## 2017-03-01 ENCOUNTER — Emergency Department (HOSPITAL_COMMUNITY): Payer: 59

## 2017-03-01 ENCOUNTER — Encounter (HOSPITAL_COMMUNITY): Payer: Self-pay | Admitting: Emergency Medicine

## 2017-03-01 DIAGNOSIS — Z940 Kidney transplant status: Secondary | ICD-10-CM | POA: Insufficient documentation

## 2017-03-01 DIAGNOSIS — Z951 Presence of aortocoronary bypass graft: Secondary | ICD-10-CM | POA: Insufficient documentation

## 2017-03-01 DIAGNOSIS — I4892 Unspecified atrial flutter: Principal | ICD-10-CM | POA: Insufficient documentation

## 2017-03-01 DIAGNOSIS — I422 Other hypertrophic cardiomyopathy: Secondary | ICD-10-CM | POA: Insufficient documentation

## 2017-03-01 DIAGNOSIS — I129 Hypertensive chronic kidney disease with stage 1 through stage 4 chronic kidney disease, or unspecified chronic kidney disease: Secondary | ICD-10-CM | POA: Insufficient documentation

## 2017-03-01 DIAGNOSIS — E7800 Pure hypercholesterolemia, unspecified: Secondary | ICD-10-CM | POA: Insufficient documentation

## 2017-03-01 DIAGNOSIS — Z87891 Personal history of nicotine dependence: Secondary | ICD-10-CM | POA: Insufficient documentation

## 2017-03-01 DIAGNOSIS — N183 Chronic kidney disease, stage 3 unspecified: Secondary | ICD-10-CM | POA: Diagnosis present

## 2017-03-01 DIAGNOSIS — R072 Precordial pain: Secondary | ICD-10-CM

## 2017-03-01 DIAGNOSIS — M109 Gout, unspecified: Secondary | ICD-10-CM | POA: Insufficient documentation

## 2017-03-01 DIAGNOSIS — I480 Paroxysmal atrial fibrillation: Secondary | ICD-10-CM | POA: Diagnosis not present

## 2017-03-01 DIAGNOSIS — Z7952 Long term (current) use of systemic steroids: Secondary | ICD-10-CM | POA: Diagnosis not present

## 2017-03-01 DIAGNOSIS — Z880 Allergy status to penicillin: Secondary | ICD-10-CM | POA: Insufficient documentation

## 2017-03-01 DIAGNOSIS — Z8673 Personal history of transient ischemic attack (TIA), and cerebral infarction without residual deficits: Secondary | ICD-10-CM | POA: Diagnosis not present

## 2017-03-01 DIAGNOSIS — I959 Hypotension, unspecified: Secondary | ICD-10-CM | POA: Insufficient documentation

## 2017-03-01 DIAGNOSIS — R079 Chest pain, unspecified: Secondary | ICD-10-CM | POA: Diagnosis not present

## 2017-03-01 DIAGNOSIS — R002 Palpitations: Secondary | ICD-10-CM | POA: Diagnosis present

## 2017-03-01 DIAGNOSIS — Z7982 Long term (current) use of aspirin: Secondary | ICD-10-CM | POA: Insufficient documentation

## 2017-03-01 DIAGNOSIS — Z91041 Radiographic dye allergy status: Secondary | ICD-10-CM | POA: Diagnosis not present

## 2017-03-01 DIAGNOSIS — Z79899 Other long term (current) drug therapy: Secondary | ICD-10-CM | POA: Diagnosis not present

## 2017-03-01 DIAGNOSIS — I2510 Atherosclerotic heart disease of native coronary artery without angina pectoris: Secondary | ICD-10-CM | POA: Diagnosis not present

## 2017-03-01 DIAGNOSIS — I4891 Unspecified atrial fibrillation: Secondary | ICD-10-CM | POA: Diagnosis not present

## 2017-03-01 LAB — BASIC METABOLIC PANEL
Anion gap: 9 (ref 5–15)
BUN: 21 mg/dL — AB (ref 6–20)
CO2: 25 mmol/L (ref 22–32)
Calcium: 10 mg/dL (ref 8.9–10.3)
Chloride: 106 mmol/L (ref 101–111)
Creatinine, Ser: 1.53 mg/dL — ABNORMAL HIGH (ref 0.61–1.24)
GFR calc Af Amer: 58 mL/min — ABNORMAL LOW (ref >60)
GFR calc non Af Amer: 50 mL/min — ABNORMAL LOW (ref >60)
GLUCOSE: 135 mg/dL — AB (ref 65–99)
POTASSIUM: 3.5 mmol/L (ref 3.5–5.1)
Sodium: 140 mmol/L (ref 135–145)

## 2017-03-01 LAB — I-STAT CHEM 8, ED
BUN: 27 mg/dL — AB (ref 6–20)
Calcium, Ion: 1.17 mmol/L (ref 1.15–1.40)
Chloride: 106 mmol/L (ref 101–111)
Creatinine, Ser: 1.5 mg/dL — ABNORMAL HIGH (ref 0.61–1.24)
Glucose, Bld: 131 mg/dL — ABNORMAL HIGH (ref 65–99)
HCT: 53 % — ABNORMAL HIGH (ref 39.0–52.0)
HEMOGLOBIN: 18 g/dL — AB (ref 13.0–17.0)
Potassium: 3.7 mmol/L (ref 3.5–5.1)
Sodium: 141 mmol/L (ref 135–145)
TCO2: 26 mmol/L (ref 0–100)

## 2017-03-01 LAB — CBC
HEMATOCRIT: 49.7 % (ref 39.0–52.0)
Hemoglobin: 15.5 g/dL (ref 13.0–17.0)
MCH: 25.4 pg — AB (ref 26.0–34.0)
MCHC: 31.2 g/dL (ref 30.0–36.0)
MCV: 81.5 fL (ref 78.0–100.0)
Platelets: 272 10*3/uL (ref 150–400)
RBC: 6.1 MIL/uL — ABNORMAL HIGH (ref 4.22–5.81)
RDW: 15.4 % (ref 11.5–15.5)
WBC: 10.8 10*3/uL — ABNORMAL HIGH (ref 4.0–10.5)

## 2017-03-01 LAB — I-STAT TROPONIN, ED: Troponin i, poc: 0.06 ng/mL (ref 0.00–0.08)

## 2017-03-01 LAB — PROTIME-INR
INR: 1.07
PROTHROMBIN TIME: 13.9 s (ref 11.4–15.2)

## 2017-03-01 LAB — MAGNESIUM: Magnesium: 1.6 mg/dL — ABNORMAL LOW (ref 1.7–2.4)

## 2017-03-01 MED ORDER — METOPROLOL TARTRATE 5 MG/5ML IV SOLN
5.0000 mg | Freq: Once | INTRAVENOUS | Status: AC
  Administered 2017-03-01: 5 mg via INTRAVENOUS
  Filled 2017-03-01: qty 5

## 2017-03-01 MED ORDER — DILTIAZEM LOAD VIA INFUSION: 15.0000 mg | Freq: Once | INTRAVENOUS | Status: DC

## 2017-03-01 MED ORDER — APIXABAN 5 MG PO TABS
5.0000 mg | ORAL_TABLET | Freq: Once | ORAL | Status: AC
  Administered 2017-03-01: 5 mg via ORAL
  Filled 2017-03-01: qty 1

## 2017-03-01 MED ORDER — PROPOFOL 10 MG/ML IV BOLUS
0.5000 mg/kg | Freq: Once | INTRAVENOUS | Status: DC
  Filled 2017-03-01: qty 20

## 2017-03-01 MED ORDER — DILTIAZEM HCL-DEXTROSE 100-5 MG/100ML-% IV SOLN (PREMIX): 5.0000 mg/h | INTRAVENOUS | Status: DC

## 2017-03-01 NOTE — ED Triage Notes (Signed)
Pt reports sudden onset of palpatations and cold sweat that started at 1700 tonight. Pt states he has taken cardizem at home along with 325MG  ASA with no relief. Pt reports recently being d/c from [facility].

## 2017-03-01 NOTE — H&P (Signed)
TRH H&P   Patient Demographics:    [name], is a 56 y.o. male  MRN: [id number]   DOB - 17-Apr-1961  Admit Date - 03/01/2017  Outpatient Primary MD for the patient is Gennette Pac, MD  Referring MD/NP/PA: Dr. Johnney Killian  Outpatient Specialists: [facility]   Patient coming from:   home  Chief Complaint  Patient presents with  . Palpitations      HPI:    [name]  is a 56 y.o. male, w CAD , Afib/Flutter apparently c/o chest pain and dyspnea and palpitation this evening and took his cardizem and this didn't resolve.  Pt presented to the ED for evaluation and found in Aflutter at 157,  Pt tx with lopressor iv and spontaneouslyl converted.      Review of systems:    In addition to the HPI above,  No Fever-chills, No Headache, No changes with Vision or hearing, No problems swallowing food or Liquids,  No Abdominal pain, No Nausea or Vommitting, Bowel movements are regular, No Blood in stool or Urine, No dysuria, No new skin rashes or bruises, No new joints pains-aches,  No new weakness, tingling, numbness in any extremity, No recent weight gain or loss, No polyuria, polydypsia or polyphagia, No significant Mental Stressors.  A full 10 point Review of Systems was done, except as stated above, all other Review of Systems were negative.   With Past History of the following :    Past Medical History:  Diagnosis Date  . CAD (coronary artery disease)    a. s/p CABG 03/2012: LIMA to LAD, free RIMA to OM1, SVG to D1, sequential SVG to AM and LPLB2, EVH via right thigh and leg.  . Cellulitis 04/13/2012   RLE saphenous vein harvest incision   . CKD (chronic kidney disease) stage 5, GFR less than 15 ml/min (HCC)    a. Diagnosed age 24, s/p yes transplant  . Dysrhythmia    A flutter, PACs and PVCs  . Elevated cholesterol   . Gout   . Hyperparathyroidism   .  Hypertension   . Hypertrophic cardiomyopathy ([place])   . Migraines   . PAF and Flutter    a. Afib post-op CABG; AFlutter 10/2012  . Peripheral vascular disease ([place])   . Sinus node dysfunction-post termination pause    a. >8sec in setting of aflutter 10/2012  . Stroke [facility]) 1995; 2001   denies residual  . Superficial vein thrombosis    a. R greater saphenous 04/2012      Past Surgical History:  Procedure Laterality Date  . ATRIAL FLUTTER ABLATION N/A 11/09/2012   Procedure: ATRIAL FLUTTER ABLATION;  Surgeon: [name], MD;  Location: [facility] CATH LAB;  Service: Cardiovascular;  Laterality: N/A;  . AV FISTULA PLACEMENT  2011   left forearm  . AV FISTULA PLACEMENT    . CARDIAC CATHETERIZATION  03/16/12  .  CORONARY ARTERY BYPASS GRAFT  03/19/2012   Procedure: CORONARY ARTERY BYPASS GRAFTING (CABG);  Surgeon: Rexene Alberts, MD;  Location: [place];  Service: Open Heart Surgery;  Laterality: N/A;  (B) MAMMARY  . CYSTOSCOPY WITH BIOPSY N/A 08/15/2016   Procedure: CYSTOSCOPY WITH IDENTIFICATION OF RIGHT TRANSPLANTATION OF URETRAL ORFICE WITH FLUORESCEIN;  Surgeon: Carolan Clines, MD;  Location: [facility] ORS;  Service: Urology;  Laterality: N/A;  . DENTAL SURGERY  2008   multiple  . [place]   left  . FRACTURE SURGERY    . HERNIA REPAIR  [id number]/[id number]   umbilical  . HOLMIUM LASER APPLICATION N/A [id number]   Procedure: HOLMIUM LASER APPLICATION WITH 3 CM RIGHT BLADDER STONE;  Surgeon: Carolan Clines, MD;  Location: [facility] ORS;  Service: Urology;  Laterality: N/A;  . INGUINAL HERNIA REPAIR  09/2009   bilaterally  . KIDNEY TRANSPLANT    . LEFT HEART CATHETERIZATION WITH CORONARY ANGIOGRAM N/A 03/16/2012   Procedure: LEFT HEART CATHETERIZATION WITH CORONARY ANGIOGRAM;  Surgeon: Sherren Mocha, MD;  Location: [facility] CATH LAB;  Service: Cardiovascular;  Laterality: N/A;  . RENAL ARTERY STENT  2001  . TRANSURETHRAL RESECTION OF BLADDER TUMOR N/A 08/15/2016   Procedure: TRANSURETHRAL BIOPSY  OF RIGHT BLADDER WALL;  Surgeon: Carolan Clines, MD;  Location: [facility];  Service: Urology;  Laterality: N/A;      Social History:     Social History  Substance Use Topics  . Smoking status: Former Smoker    Packs/day: 0.30    Years: 30.00    Types: Cigarettes    Quit date: 03/15/2012  . Smokeless tobacco: Never Used  . Alcohol use Yes     Comment: 04/13/12 "2 mixed drinks/month"     Lives - at home  Mobility - walks by self     Family History :     Family History  Problem Relation Age of Onset  . Adopted: Yes  . Family history unknown: Yes      Home Medications:   Prior to Admission medications   Medication Sig Start Date End Date Taking? Authorizing Provider  allopurinol (ZYLOPRIM) 100 MG tablet Take 200 mg by mouth every morning.    Yes Historical Provider, MD  aspirin 81 MG EC tablet Take 1 tablet (81 mg total) by mouth daily. 10/28/12  Yes Rogelia Mire, NP  cloNIDine (CATAPRES) 0.2 MG tablet Take 0.2 mg by mouth every evening.    Yes Historical Provider, MD  diltiazem (CARDIZEM) 60 MG tablet Take 1 tablet (60 mg total) by mouth as needed (for palpitations). 01/08/17  Yes Josue Hector, MD  fenofibrate micronized (LOFIBRA) 134 MG capsule Take 134 mg by mouth daily.    Yes Historical Provider, MD  labetalol (NORMODYNE) 200 MG tablet Take 1 tablet (200 mg total) by mouth 2 (two) times daily. 07/11/16  Yes [name], MD  Magnesium 400 MG CAPS Take 400 mg by mouth daily.    Yes Historical Provider, MD  mycophenolate (MYFORTIC) 180 MG EC tablet Take 720 mg by mouth 2 (two) times daily.    Yes Historical Provider, MD  omega-3 acid ethyl esters (LOVAZA) 1 G capsule Take 2 g by mouth 2 (two) times daily.   Yes Historical Provider, MD  omeprazole (PRILOSEC) 20 MG capsule Take 20 mg by mouth daily. 12/12/15  Yes Historical Provider, MD  predniSONE (DELTASONE) 5 MG tablet Take 5 mg by mouth at bedtime.   Yes Historical Provider, MD  simvastatin (ZOCOR) 40  MG  tablet Take 40 mg by mouth as directed. Take 1 tablet every day except for Fridays and Saturdays   Yes Historical Provider, MD  sulfamethoxazole-trimethoprim (BACTRIM,SEPTRA) 400-80 MG tablet Take 1 tablet by mouth every Monday, Wednesday, and Friday. 02/10/17  Yes Historical Provider, MD  tacrolimus (PROGRAF) 1 MG capsule Take 1 mg by mouth 2 (two) times daily. 12/12/15  Yes Historical Provider, MD  Vitamin D, Ergocalciferol, (DRISDOL) 50000 units CAPS capsule Take 50,000 Units by mouth 2 (two) times a week. On Tuesdays and Saturdays 06/11/16  Yes Historical Provider, MD  oseltamivir (TAMIFLU) 75 MG capsule Take 1 capsule (75 mg total) by mouth 2 (two) times daily. For 4days Patient not taking: Reported on 02/24/2017 02/19/17   Domenic Polite, MD     Allergies:     Allergies  Allergen Reactions  . Contrast Media [Iodinated Diagnostic Agents]     coded  . Penicillins Itching and Rash    Has patient had a PCN reaction causing immediate rash, facial/tongue/throat swelling, SOB or lightheadedness with hypotension: Yes Has patient had a PCN reaction causing severe rash involving mucus membranes or skin necrosis: No Has patient had a PCN reaction that required hospitalization No Has patient had a PCN reaction occurring within the last 10 years: No If all of the above answers are "NO", then may proceed with Cephalosporin use.      Physical Exam:   Vitals  Blood pressure (!) 86/59, pulse 65, temperature 97.5 F (36.4 C), temperature source Oral, resp. rate 13, height 5\' 8"  (1.727 m), weight 83.9 kg (185 lb), SpO2 100 %.   1. General  lying in bed in NAD,    2. Normal affect and insight, Not Suicidal or Homicidal, Awake Alert, Oriented X 3.  3. No F.N deficits, ALL C.Nerves Intact, Strength 5/5 all 4 extremities, Sensation intact all 4 extremities, Plantars down going.  4. Ears and Eyes appear Normal, Conjunctivae clear, PERRLA. Moist Oral Mucosa.  5. Supple Neck, No JVD, No cervical  lymphadenopathy appriciated, No Carotid Bruits.  6. Symmetrical Chest wall movement, Good air movement bilaterally, CTAB.  7. RRR, No Gallops, Rubs or Murmurs, No Parasternal Heave.  8. Positive Bowel Sounds, Abdomen Soft, No tenderness, No organomegaly appriciated,No rebound -guarding or rigidity.  9.  No Cyanosis, Normal Skin Turgor, No Skin Rash or Bruise.  10. Good muscle tone,  joints appear normal , no effusions, Normal ROM.  11. No Palpable Lymph Nodes in Neck or Axillae     Data Review:    CBC  Recent Labs Lab 03/01/17 2057 03/01/17 2108  WBC 10.8*  --   HGB 15.5 18.0*  HCT 49.7 53.0*  PLT 272  --   MCV 81.5  --   MCH 25.4*  --   MCHC 31.2  --   RDW 15.4  --    ------------------------------------------------------------------------------------------------------------------  Chemistries   Recent Labs Lab 03/01/17 2057 03/01/17 2108  NA 140 141  K 3.5 3.7  CL 106 106  CO2 25  --   GLUCOSE 135* 131*  BUN 21* 27*  CREATININE 1.53* 1.50*  CALCIUM 10.0  --   MG 1.6*  --    ------------------------------------------------------------------------------------------------------------------ estimated creatinine clearance is 60.1 mL/min (A) (by C-G formula based on SCr of 1.5 mg/dL (H)). ------------------------------------------------------------------------------------------------------------------ No results for input(s): TSH, T4TOTAL, T3FREE, THYROIDAB in the last 72 hours.  Invalid input(s): FREET3  Coagulation profile  Recent Labs Lab 03/01/17 2057  INR 1.07   ------------------------------------------------------------------------------------------------------------------- No results for input(s):  DDIMER in the last 72 hours. -------------------------------------------------------------------------------------------------------------------  Cardiac Enzymes No results for input(s): CKMB, TROPONINI, MYOGLOBIN in the last 168 hours.  Invalid  input(s): CK ------------------------------------------------------------------------------------------------------------------    Component Value Date/Time   BNP 339.9 (H) 05/18/2015 1849     ---------------------------------------------------------------------------------------------------------------  Urinalysis    Component Value Date/Time   COLORURINE AMBER (A) 02/16/2017 1132   APPEARANCEUR HAZY (A) 02/16/2017 1132   LABSPEC 1.028 02/16/2017 1132   PHURINE 5.0 02/16/2017 1132   GLUCOSEU NEGATIVE 02/16/2017 1132   HGBUR MODERATE (A) 02/16/2017 1132   BILIRUBINUR NEGATIVE 02/16/2017 1132   KETONESUR NEGATIVE 02/16/2017 1132   PROTEINUR 100 (A) 02/16/2017 1132   UROBILINOGEN 1.0 11/21/2014 1329   NITRITE NEGATIVE 02/16/2017 1132   LEUKOCYTESUR SMALL (A) 02/16/2017 1132    ----------------------------------------------------------------------------------------------------------------   Imaging Results:    Dg Chest 2 View  Result Date: 03/01/2017 CLINICAL DATA:  Chest pain EXAM: CHEST  2 VIEW COMPARISON:  02/16/2017 FINDINGS: There is moderate cardiac enlargement. Previous median sternotomy and CABG procedure. There is no pleural effusion or edema. No airspace opacities. IMPRESSION: No active cardiopulmonary disease. Electronically Signed   By: Kerby Moors M.D.   On: 03/01/2017 21:06     Assessment & Plan:    Principal Problem:   Atrial flutter ([place]) Active Problems:   CKD (chronic kidney disease), stage III    1. Aflutter w RVR Tele Trop I q6h x3 Check tsh Check cardiac echo Discussed case with cardiology fellow who thought that the patient could probably go home w outpatient f/u but ED physician thought that the patient was too unstable and requested obs admission Please consult cardiology in AM Start eliquis as per cardiology fellow suggestion  2. Hypotension (asymptomatic) after lopressor Hydrate with ns iv  3. CKD stage 3 Check cmp in am   DVT  Prophylaxis eliquis  AM Labs Ordered, also please review Full Orders  Family Communication: Admission, patients condition and plan of care including tests being ordered have been discussed with the patient  who indicate understanding and agree with the plan and Code Status.  Code Status FULL CODE  Likely DC to  home  Condition GUARDED    Consults called:  Please call cardiology in am  Admission status: obs  Time spent in minutes : 45 minutes   Jani Gravel M.D on 03/01/2017 at 11:33 PM  Between 7am to 7pm - Pager - [phone]   After 7pm go to [email or web address] - password [facility]  [facility] - Office  [phone]

## 2017-03-01 NOTE — ED Notes (Signed)
Holding cardioversion & procedural sedation as lopressor lowered HR.  MD Pfeiffer aware and cardiologist called.  Will continue to monitor.

## 2017-03-02 ENCOUNTER — Encounter (HOSPITAL_COMMUNITY): Payer: Self-pay | Admitting: Nurse Practitioner

## 2017-03-02 ENCOUNTER — Observation Stay (HOSPITAL_BASED_OUTPATIENT_CLINIC_OR_DEPARTMENT_OTHER): Payer: 59

## 2017-03-02 ENCOUNTER — Encounter: Payer: Self-pay | Admitting: Physician Assistant

## 2017-03-02 DIAGNOSIS — I959 Hypotension, unspecified: Secondary | ICD-10-CM | POA: Diagnosis not present

## 2017-03-02 DIAGNOSIS — I129 Hypertensive chronic kidney disease with stage 1 through stage 4 chronic kidney disease, or unspecified chronic kidney disease: Secondary | ICD-10-CM | POA: Diagnosis not present

## 2017-03-02 DIAGNOSIS — I480 Paroxysmal atrial fibrillation: Secondary | ICD-10-CM | POA: Diagnosis not present

## 2017-03-02 DIAGNOSIS — I4892 Unspecified atrial flutter: Secondary | ICD-10-CM | POA: Diagnosis not present

## 2017-03-02 DIAGNOSIS — N183 Chronic kidney disease, stage 3 (moderate): Secondary | ICD-10-CM | POA: Diagnosis not present

## 2017-03-02 DIAGNOSIS — I422 Other hypertrophic cardiomyopathy: Secondary | ICD-10-CM | POA: Diagnosis not present

## 2017-03-02 DIAGNOSIS — E7800 Pure hypercholesterolemia, unspecified: Secondary | ICD-10-CM | POA: Diagnosis not present

## 2017-03-02 DIAGNOSIS — M109 Gout, unspecified: Secondary | ICD-10-CM | POA: Diagnosis not present

## 2017-03-02 DIAGNOSIS — I2510 Atherosclerotic heart disease of native coronary artery without angina pectoris: Secondary | ICD-10-CM | POA: Diagnosis not present

## 2017-03-02 LAB — COMPREHENSIVE METABOLIC PANEL
ALBUMIN: 3.6 g/dL (ref 3.5–5.0)
ALT: 23 U/L (ref 17–63)
AST: 24 U/L (ref 15–41)
Alkaline Phosphatase: 42 U/L (ref 38–126)
Anion gap: 8 (ref 5–15)
BUN: 26 mg/dL — ABNORMAL HIGH (ref 6–20)
CHLORIDE: 109 mmol/L (ref 101–111)
CO2: 23 mmol/L (ref 22–32)
CREATININE: 1.67 mg/dL — AB (ref 0.61–1.24)
Calcium: 9.8 mg/dL (ref 8.9–10.3)
GFR calc non Af Amer: 45 mL/min — ABNORMAL LOW (ref >60)
GFR, EST AFRICAN AMERICAN: 52 mL/min — AB (ref >60)
GLUCOSE: 126 mg/dL — AB (ref 65–99)
Potassium: 3.6 mmol/L (ref 3.5–5.1)
Sodium: 140 mmol/L (ref 135–145)
Total Bilirubin: 0.6 mg/dL (ref 0.3–1.2)
Total Protein: 6.3 g/dL — ABNORMAL LOW (ref 6.5–8.1)

## 2017-03-02 LAB — CBC
HCT: 46.9 % (ref 39.0–52.0)
HEMOGLOBIN: 15 g/dL (ref 13.0–17.0)
MCH: 26 pg (ref 26.0–34.0)
MCHC: 32 g/dL (ref 30.0–36.0)
MCV: 81.4 fL (ref 78.0–100.0)
Platelets: 254 10*3/uL (ref 150–400)
RBC: 5.76 MIL/uL (ref 4.22–5.81)
RDW: 15.5 % (ref 11.5–15.5)
WBC: 9.5 10*3/uL (ref 4.0–10.5)

## 2017-03-02 LAB — TROPONIN I
Troponin I: 0.96 ng/mL (ref <0.03)
Troponin I: 2.19 ng/mL (ref <0.03)

## 2017-03-02 LAB — TSH: TSH: 1.484 u[IU]/mL (ref 0.350–4.500)

## 2017-03-02 LAB — MAGNESIUM: MAGNESIUM: 2.1 mg/dL (ref 1.7–2.4)

## 2017-03-02 MED ORDER — MYCOPHENOLATE SODIUM 180 MG PO TBEC
720.0000 mg | DELAYED_RELEASE_TABLET | Freq: Two times a day (BID) | ORAL | Status: DC
  Administered 2017-03-02 (×2): 720 mg via ORAL
  Filled 2017-03-02 (×2): qty 4

## 2017-03-02 MED ORDER — ACETAMINOPHEN 325 MG PO TABS: 650.0000 mg | ORAL_TABLET | Freq: Four times a day (QID) | ORAL | Status: DC | PRN

## 2017-03-02 MED ORDER — FENOFIBRATE 54 MG PO TABS
54.0000 mg | ORAL_TABLET | Freq: Every day | ORAL | Status: DC
  Administered 2017-03-02: 54 mg via ORAL
  Filled 2017-03-02: qty 1

## 2017-03-02 MED ORDER — PREDNISONE 5 MG PO TABS
5.0000 mg | ORAL_TABLET | Freq: Every day | ORAL | Status: DC
  Administered 2017-03-02: 5 mg via ORAL
  Filled 2017-03-02: qty 1

## 2017-03-02 MED ORDER — TACROLIMUS 1 MG PO CAPS
1.0000 mg | ORAL_CAPSULE | Freq: Two times a day (BID) | ORAL | Status: DC
  Administered 2017-03-02 (×2): 1 mg via ORAL
  Filled 2017-03-02 (×2): qty 1

## 2017-03-02 MED ORDER — MAGNESIUM OXIDE 400 (241.3 MG) MG PO TABS
400.0000 mg | ORAL_TABLET | Freq: Every day | ORAL | Status: DC
  Administered 2017-03-02: 400 mg via ORAL
  Filled 2017-03-02: qty 1

## 2017-03-02 MED ORDER — METOPROLOL TARTRATE 50 MG PO TABS: 50.0000 mg | ORAL_TABLET | Freq: Two times a day (BID) | ORAL | Status: DC

## 2017-03-02 MED ORDER — OMEGA-3-ACID ETHYL ESTERS 1 G PO CAPS
2.0000 g | ORAL_CAPSULE | Freq: Two times a day (BID) | ORAL | Status: DC
  Administered 2017-03-02 (×2): 2 g via ORAL
  Filled 2017-03-02 (×2): qty 2

## 2017-03-02 MED ORDER — SODIUM CHLORIDE 0.9 % IV SOLN
INTRAVENOUS | Status: AC
  Administered 2017-03-02: 01:00:00 via INTRAVENOUS

## 2017-03-02 MED ORDER — MAGNESIUM SULFATE 2 GM/50ML IV SOLN
2.0000 g | Freq: Once | INTRAVENOUS | Status: AC
  Administered 2017-03-02: 2 g via INTRAVENOUS
  Filled 2017-03-02: qty 50

## 2017-03-02 MED ORDER — SODIUM CHLORIDE 0.9% FLUSH
3.0000 mL | Freq: Two times a day (BID) | INTRAVENOUS | Status: DC
  Administered 2017-03-02: 3 mL via INTRAVENOUS

## 2017-03-02 MED ORDER — SULFAMETHOXAZOLE-TRIMETHOPRIM 400-80 MG PO TABS
1.0000 | ORAL_TABLET | ORAL | Status: DC
Start: 2017-03-03 — End: 2017-03-02

## 2017-03-02 MED ORDER — LABETALOL HCL 200 MG PO TABS
200.0000 mg | ORAL_TABLET | Freq: Two times a day (BID) | ORAL | Status: DC
  Filled 2017-03-02: qty 1

## 2017-03-02 MED ORDER — PANTOPRAZOLE SODIUM 40 MG PO TBEC
40.0000 mg | DELAYED_RELEASE_TABLET | Freq: Every day | ORAL | Status: DC
  Administered 2017-03-02: 40 mg via ORAL
  Filled 2017-03-02: qty 1

## 2017-03-02 MED ORDER — ACETAMINOPHEN 650 MG RE SUPP: 650.0000 mg | Freq: Four times a day (QID) | RECTAL | Status: DC | PRN

## 2017-03-02 MED ORDER — VITAMIN D (ERGOCALCIFEROL) 1.25 MG (50000 UNIT) PO CAPS
50000.0000 [IU] | ORAL_CAPSULE | ORAL | Status: DC
Start: 2017-03-04 — End: 2017-03-02

## 2017-03-02 MED ORDER — APIXABAN 5 MG PO TABS
5.0000 mg | ORAL_TABLET | Freq: Two times a day (BID) | ORAL | Status: DC
  Administered 2017-03-02: 5 mg via ORAL
  Filled 2017-03-02: qty 1

## 2017-03-02 MED ORDER — SIMVASTATIN 40 MG PO TABS: 40.0000 mg | ORAL_TABLET | ORAL | Status: DC

## 2017-03-02 MED ORDER — ALLOPURINOL 100 MG PO TABS
200.0000 mg | ORAL_TABLET | Freq: Every day | ORAL | Status: DC
  Administered 2017-03-02: 200 mg via ORAL
  Filled 2017-03-02: qty 2

## 2017-03-02 NOTE — Progress Notes (Signed)
CRITICAL VALUE ALERT  Critical value received:Troponin 2.19   Date of notification:  03/02/17  Time of notification:  0900  Critical value read back:Yes.    Nurse who received alert:  Sheffield Slider  MD notified (1st page):  Dr. Cathlean Sauer   Time of first page:  0920  MD notified (2nd page):  Time of second page:  Responding MD:  Dr. Cathlean Sauer  Time MD responded:  [phone]

## 2017-03-02 NOTE — Progress Notes (Signed)
CRITICAL VALUE ALERT  Critical value received:  Troponin 0.96  Date of notification:  03/02/2017  Time of notification:  0420  Critical value read back:Yes.    Nurse who received alert:  Drucilla Chalet   MD notified (1st page): Maudie Mercury   Time of first page:  0421  MD notified (2nd page):  Time of second page:  Responding MD:  Maudie Mercury   Time MD responded:  Ull.Santiago  MD also made aware of tele alert for 10 beat run of SVT. Pt. Was resting in the bed with no c/o of SOB. Order for IV magnesium was obtained. Will administer and continue to monitor.

## 2017-03-02 NOTE — Progress Notes (Signed)
  Echocardiogram 2D Echocardiogram has been performed.  [name] 03/02/2017, 9:39 AM

## 2017-03-02 NOTE — Progress Notes (Signed)
Fellow left note requesting patient have f/u with Dr. Caryl Comes due to recurrent atrial flutter (spontaneous conversion in ER). I have sent a message to our [facility] office's scheduler requesting a follow-up appointment, and our office will call the patient with this information.  Dayna Dunn PA-C

## 2017-03-02 NOTE — Progress Notes (Signed)
ANTICOAGULATION CONSULT NOTE - Initial Consult  Pharmacy Consult for apixaban Indication: A flutter  Allergies  Allergen Reactions  . Contrast Media [Iodinated Diagnostic Agents]     coded  . Penicillins Itching and Rash    Has patient had a PCN reaction causing immediate rash, facial/tongue/throat swelling, SOB or lightheadedness with hypotension: Yes Has patient had a PCN reaction causing severe rash involving mucus membranes or skin necrosis: No Has patient had a PCN reaction that required hospitalization No Has patient had a PCN reaction occurring within the last 10 years: No If all of the above answers are "NO", then may proceed with Cephalosporin use.     Patient Measurements: Height: 5\' 8"  (172.7 cm) Weight: 188 lb 8 oz (85.5 kg) IBW/kg (Calculated) : 68.4 Heparin Dosing Weight:   Vital Signs: Temp: 97.5 F (36.4 C) (04/01 0050) Temp Source: Oral (04/01 0050) BP: 89/56 (04/01 0050) Pulse Rate: 86 (04/01 0050)  Labs:  Recent Labs  03/01/17 2057 03/01/17 2108 03/02/17 0207  HGB 15.5 18.0* 15.0  HCT 49.7 53.0* 46.9  PLT 272  --  254  LABPROT 13.9  --   --   INR 1.07  --   --   CREATININE 1.53* 1.50* 1.67*  TROPONINI  --   --  0.96*    Estimated Creatinine Clearance: 54.4 mL/min (A) (by C-G formula based on SCr of 1.67 mg/dL (H)).   Medical History: Past Medical History:  Diagnosis Date  . CAD (coronary artery disease)    a. s/p CABG 03/2012: LIMA to LAD, free RIMA to OM1, SVG to D1, sequential SVG to AM and LPLB2, EVH via right thigh and leg.  . Cellulitis 04/13/2012   RLE saphenous vein harvest incision   . CKD (chronic kidney disease) stage 5, GFR less than 15 ml/min (HCC)    a. Diagnosed age 16, s/p yes transplant  . Dysrhythmia    A flutter, PACs and PVCs  . Elevated cholesterol   . Gout   . Hyperparathyroidism   . Hypertension   . Hypertrophic cardiomyopathy ([place])   . Migraines   . PAF and Flutter    a. Afib post-op CABG; AFlutter 10/2012  .  Peripheral vascular disease ([place])   . Sinus node dysfunction-post termination pause    a. >8sec in setting of aflutter 10/2012  . Stroke [facility]) 1995; 2001   denies residual  . Superficial vein thrombosis    a. R greater saphenous 04/2012    Medications:  Prescriptions Prior to Admission  Medication Sig Dispense Refill Last Dose  . allopurinol (ZYLOPRIM) 100 MG tablet Take 200 mg by mouth every morning.    03/01/2017 at Unknown time  . aspirin 81 MG EC tablet Take 1 tablet (81 mg total) by mouth daily. 30 tablet  02/28/2017 at Unknown time  . cloNIDine (CATAPRES) 0.2 MG tablet Take 0.2 mg by mouth every evening.    02/28/2017 at Unknown time  . diltiazem (CARDIZEM) 60 MG tablet Take 1 tablet (60 mg total) by mouth as needed (for palpitations). 30 tablet 6 03/01/2017 at Unknown time  . fenofibrate micronized (LOFIBRA) 134 MG capsule Take 134 mg by mouth daily.    03/01/2017 at Unknown time  . labetalol (NORMODYNE) 200 MG tablet Take 1 tablet (200 mg total) by mouth 2 (two) times daily. 180 tablet 1 03/01/2017 at 1700  . Magnesium 400 MG CAPS Take 400 mg by mouth daily.    03/01/2017 at Unknown time  . mycophenolate (MYFORTIC) 180 MG EC  tablet Take 720 mg by mouth 2 (two) times daily.    03/01/2017 at 1000  . omega-3 acid ethyl esters (LOVAZA) 1 G capsule Take 2 g by mouth 2 (two) times daily.   03/01/2017 at Unknown time  . omeprazole (PRILOSEC) 20 MG capsule Take 20 mg by mouth daily.  5 03/01/2017 at Unknown time  . predniSONE (DELTASONE) 5 MG tablet Take 5 mg by mouth at bedtime.   02/28/2017 at Unknown time  . simvastatin (ZOCOR) 40 MG tablet Take 40 mg by mouth as directed. Take 1 tablet every day except for Fridays and Saturdays   Past Week at Unknown time  . sulfamethoxazole-trimethoprim (BACTRIM,SEPTRA) 400-80 MG tablet Take 1 tablet by mouth every Monday, Wednesday, and Friday.  6 02/28/2017 at Unknown time  . tacrolimus (PROGRAF) 1 MG capsule Take 1 mg by mouth 2 (two) times daily.  5 03/01/2017 at  1000  . Vitamin D, Ergocalciferol, (DRISDOL) 50000 units CAPS capsule Take 50,000 Units by mouth 2 (two) times a week. On Tuesdays and Saturdays  5 03/01/2017 at Unknown time  . oseltamivir (TAMIFLU) 75 MG capsule Take 1 capsule (75 mg total) by mouth 2 (two) times daily. For 4days (Patient not taking: Reported on 02/24/2017) 7 capsule 0 Not Taking at Unknown time   Scheduled:  . allopurinol  200 mg Oral Daily  . fenofibrate  54 mg Oral Daily  . labetalol  200 mg Oral BID  . magnesium oxide  400 mg Oral Daily  . magnesium sulfate 1 - 4 g bolus IVPB  2 g Intravenous Once  . mycophenolate  720 mg Oral BID  . omega-3 acid ethyl esters  2 g Oral BID  . pantoprazole  40 mg Oral Daily  . predniSONE  5 mg Oral QHS  . simvastatin  40 mg Oral Once per day on Sun Mon Tue Wed Thu  . sodium chloride flush  3 mL Intravenous Q12H  . [START ON 03/03/2017] sulfamethoxazole-trimethoprim  1 tablet Oral Q M,W,F  . tacrolimus  1 mg Oral BID  . [START ON 03/04/2017] Vitamin D (Ergocalciferol)  50,000 Units Oral Once per day on Tue Sat    Assessment: Patient in ED with palpatations.  Hx of afib/aflutter.  No oral anticoagulants noted on med rec.   Goal of Therapy:  Safe and effective use of apixaban    Plan:  Start apixaban 5mg  po bid Provide patient education on 1st shift.  Tyler Deis, Shea Stakes [place] 03/02/2017,4:29 AM

## 2017-03-02 NOTE — Progress Notes (Signed)
Patient leaving against medical advice, Dr. Cathlean Sauer informed, saw patient and informed him of his troponin level, cardiology consult and that more lab needs to be drawn. patient still wants to leave and understand he is leaving against  Medical advice. Patient alert and oriented, denies any pain/distress. Accompanied home by wife.

## 2017-03-02 NOTE — Discharge Instructions (Addendum)

## 2017-03-04 MED FILL — VIT D2 1.25 MG (50,000 UNIT: 1.25 MG | 35 days supply | Qty: 10 | Fill #2

## 2017-03-04 MED FILL — CIALIS 5 MG TABLET: 5 | 30 days supply | Qty: 30 | Fill #2

## 2017-03-04 MED FILL — predniSONE 5 MG TABS: 5 | 90 days supply | Qty: 90 | Fill #2

## 2017-03-04 NOTE — Discharge Summary (Signed)
Physician Discharge Summary  [name] Acy TGP:[id number] DOB: 03-02-1961 DOA: 03/01/2017  PCP: Gennette Pac, MD  Admit date: 03/01/2017 Discharge date: 03/04/2017  Admitted From: Home  Disposition:  Home   Recommendations for Outpatient Follow-up:  1. Follow up with PCP in 1-week  Home Health: No  Equipment/Devices: No   Discharge Condition: Stable  CODE STATUS: Full  Diet recommendation: Heart Healthy   Brief/Interim Summary: This is a 56 year old male who presented to the hospital with a chief complaint of palpitations. He is known to have atrial fibrillation and atrial flutter, follow-up as an outpatient, patient takes as needed diltiazem for palpitations. Emergency room where he was found to have atrial flutter 150 as per minute. He received metoprolol intravenously with conversion to sinus rhythm. He regained hemodynamic stability. Clinically he was not in heart failure. Patient was admitted for observation, started on apixaban per cardiology fellow recommendation.   Patient remained sinus rhythm with no chest pain or palpitations, his troponin was 0.96, repeat to 2.19. Patient decided to leave the hospital [name] did not want to wait for cardiology consult or follow-up troponin. He asure he will come back in case of recurrent symptoms, he will follow-up with his cardiologist as an outpatient.   Was explained in detail about the consequences of leaving the hospital [name], including worsening arrhythmia, cardiac ischemia and death. Patient understands the consequences and decided to leave [place].     Discharge Diagnoses:  Principal Problem:   Atrial flutter ([place]) Active Problems:   CKD (chronic kidney disease), stage III    Discharge Instructions   Allergies as of 03/02/2017      Reactions   Contrast Media [iodinated Diagnostic Agents]    coded   Penicillins Itching, Rash   Has patient had a PCN reaction causing  immediate rash, facial/tongue/throat swelling, SOB or lightheadedness with hypotension: Yes Has patient had a PCN reaction causing severe rash involving mucus membranes or skin necrosis: No Has patient had a PCN reaction that required hospitalization No Has patient had a PCN reaction occurring within the last 10 years: No If all of the above answers are "NO", then may proceed with Cephalosporin use.      Medication List    ASK your doctor about these medications   allopurinol 100 MG tablet Commonly known as:  ZYLOPRIM Take 200 mg by mouth every morning.   aspirin 81 MG EC tablet Take 1 tablet (81 mg total) by mouth daily.   cloNIDine 0.2 MG tablet Commonly known as:  CATAPRES Take 0.2 mg by mouth every evening.   diltiazem 60 MG tablet Commonly known as:  CARDIZEM Take 1 tablet (60 mg total) by mouth as needed (for palpitations).   fenofibrate micronized 134 MG capsule Commonly known as:  LOFIBRA Take 134 mg by mouth daily.   labetalol 200 MG tablet Commonly known as:  NORMODYNE Take 1 tablet (200 mg total) by mouth 2 (two) times daily.   LOVAZA 1 g capsule Generic drug:  omega-3 acid ethyl esters Take 2 g by mouth 2 (two) times daily.   Magnesium 400 MG Caps Take 400 mg by mouth daily.   mycophenolate 180 MG EC tablet Commonly known as:  MYFORTIC Take 720 mg by mouth 2 (two) times daily.   omeprazole 20 MG capsule Commonly known as:  PRILOSEC Take 20 mg by mouth daily.   oseltamivir 75 MG capsule Commonly known as:  TAMIFLU Take 1 capsule (75 mg total) by mouth  2 (two) times daily. For 4days   predniSONE 5 MG tablet Commonly known as:  DELTASONE Take 5 mg by mouth at bedtime.   simvastatin 40 MG tablet Commonly known as:  ZOCOR Take 40 mg by mouth as directed. Take 1 tablet every day except for Fridays and Saturdays   sulfamethoxazole-trimethoprim 400-80 MG tablet Commonly known as:  BACTRIM,SEPTRA Take 1 tablet by mouth every Monday, Wednesday, and  Friday.   tacrolimus 1 MG capsule Commonly known as:  PROGRAF Take 1 mg by mouth 2 (two) times daily.   Vitamin D (Ergocalciferol) 50000 units Caps capsule Commonly known as:  DRISDOL Take 50,000 Units by mouth 2 (two) times a week. On Tuesdays and Saturdays       Allergies  Allergen Reactions  . Contrast Media [Iodinated Diagnostic Agents]     coded  . Penicillins Itching and Rash    Has patient had a PCN reaction causing immediate rash, facial/tongue/throat swelling, SOB or lightheadedness with hypotension: Yes Has patient had a PCN reaction causing severe rash involving mucus membranes or skin necrosis: No Has patient had a PCN reaction that required hospitalization No Has patient had a PCN reaction occurring within the last 10 years: No If all of the above answers are "NO", then may proceed with Cephalosporin use.     Consultations:  Cardiology    Procedures/Studies: Ct Abdomen Pelvis Wo Contrast  Result Date: 02/16/2017 CLINICAL DATA:  Previous renal transplant. Abdominal pain. Headache. Difficulty urinating. EXAM: CT ABDOMEN AND PELVIS WITHOUT CONTRAST TECHNIQUE: Multidetector CT imaging of the abdomen and pelvis was performed following the standard protocol without IV contrast. COMPARISON:  05/29/2016 FINDINGS: Lower chest: Lung bases are clear. No pleural or pericardial fluid. The heart is enlarged. Hepatobiliary: Liver parenchyma appears normal without contrast. No calcified gallstones. Pancreas: Normal Spleen: Normal Adrenals/Urinary Tract: Adrenal glands are normal. Native right kidney is markedly atrophic. Native left kidney is atrophic, with 2 cysts at the upper pole that are stable, the larger measuring 3.4 cm in diameter and the smaller measuring 1.7 cm in diameter. Transplant kidney in the right iliac fossa appears unremarkable by CT. No hydronephrosis. No stone disease. Bladder appears normal. Stomach/Bowel: No acute bowel finding. Previous abdominal mesh. Appendix  appears normal. Vascular/Lymphatic: Aortic atherosclerosis. Iliac atherosclerosis. No aneurysm. IVC is normal. No retroperitoneal adenopathy. Reproductive: Negative Other: No free fluid or air. Musculoskeletal: Normal IMPRESSION: No acute finding by CT. Right iliac fossa transplant kidney appears unremarkable. No hydronephrosis. Atrophic native kidneys as seen previously with benign appearing renal cysts on the left. Aortic atherosclerosis. Iliac atherosclerosis. No signs of mesenteric ischemia on this exam. Electronically Signed   By: Nelson Chimes M.D.   On: 02/16/2017 14:22   Dg Chest 2 View  Result Date: 03/01/2017 CLINICAL DATA:  Chest pain EXAM: CHEST  2 VIEW COMPARISON:  02/16/2017 FINDINGS: There is moderate cardiac enlargement. Previous median sternotomy and CABG procedure. There is no pleural effusion or edema. No airspace opacities. IMPRESSION: No active cardiopulmonary disease. Electronically Signed   By: Kerby Moors M.D.   On: 03/01/2017 21:06   Dg Chest 2 View  Result Date: 02/16/2017 CLINICAL DATA:  Productive cough for 2-3 weeks, high WBC. Hx of CABG(2013), kidney transplant(2014), hypertension, CAD, dysrhythmia, hyperparathyroidism, PAF and flutter, stroke, cardiac catheterization, AV fistula placement(2011). Former smoker. EXAM: CHEST  2 VIEW COMPARISON:  11/30/2016 FINDINGS: Prior CABG. Atherosclerotic calcification of the aortic arch. Mild enlargement of the cardiopericardial silhouette. No pulmonary edema.  No pleural effusion.  The lungs  appear clear. IMPRESSION: 1. Mild enlargement of the cardiopericardial silhouette with prior CABG. No edema or additional findings. Electronically Signed   By: Van Clines M.D.   On: 02/16/2017 16:45     Subjective: Patient with no palpitations, no dyspnea.  Discharge Exam: Vitals:   03/02/17 0533 03/02/17 0951  BP: 104/60 (!) 103/59  Pulse:  (!) 52  Resp: 16   Temp: 97.7 F (36.5 C)    Vitals:   03/02/17 0000 03/02/17 0050  03/02/17 0533 03/02/17 0951  BP: (!) 87/64 (!) 89/56 104/60 (!) 103/59  Pulse: 71 86  (!) 52  Resp: (!) 23 20 16    Temp:  97.5 F (36.4 C) 97.7 F (36.5 C)   TempSrc:  Oral Oral   SpO2: 97% 97% 98%   Weight:  85.5 kg (188 lb 8 oz) 85.3 kg (188 lb)   Height:  5\' 8"  (1.727 m)      General: Pt is alert, awake, not in acute distress Cardiovascular: RRR, S1/S2 +, no rubs, no gallops Respiratory: CTA bilaterally, no wheezing, no rhonchi Abdominal: Soft, NT, ND, bowel sounds + Extremities: no edema, no cyanosis    The results of significant diagnostics from this hospitalization (including imaging, microbiology, ancillary and laboratory) are listed below for reference.     Microbiology: No results found for this or any previous visit (from the past 240 hour(s)).   Labs: BNP (last 3 results) No results for input(s): BNP in the last 8760 hours. Basic Metabolic Panel:  Recent Labs Lab 03/01/17 2057 03/01/17 2108 03/02/17 0207 03/02/17 0758  NA 140 141 140  --   K 3.5 3.7 3.6  --   CL 106 106 109  --   CO2 25  --  23  --   GLUCOSE 135* 131* 126*  --   BUN 21* 27* 26*  --   CREATININE 1.53* 1.50* 1.67*  --   CALCIUM 10.0  --  9.8  --   MG 1.6*  --   --  2.1   Liver Function Tests:  Recent Labs Lab 03/02/17 0207  AST 24  ALT 23  ALKPHOS 42  BILITOT 0.6  PROT 6.3*  ALBUMIN 3.6   No results for input(s): LIPASE, AMYLASE in the last 168 hours. No results for input(s): AMMONIA in the last 168 hours. CBC:  Recent Labs Lab 03/01/17 2057 03/01/17 2108 03/02/17 0207  WBC 10.8*  --  9.5  HGB 15.5 18.0* 15.0  HCT 49.7 53.0* 46.9  MCV 81.5  --  81.4  PLT 272  --  254   Cardiac Enzymes:  Recent Labs Lab 03/02/17 0207 03/02/17 0758  TROPONINI 0.96* 2.19*   BNP: Invalid input(s): POCBNP CBG: No results for input(s): GLUCAP in the last 168 hours. D-Dimer No results for input(s): DDIMER in the last 72 hours. Hgb A1c No results for input(s): HGBA1C in the  last 72 hours. Lipid Profile No results for input(s): CHOL, HDL, LDLCALC, TRIG, CHOLHDL, LDLDIRECT in the last 72 hours. Thyroid function studies  Recent Labs  03/02/17 0207  TSH 1.484   Anemia work up No results for input(s): VITAMINB12, FOLATE, FERRITIN, TIBC, IRON, RETICCTPCT in the last 72 hours. Urinalysis    Component Value Date/Time   COLORURINE AMBER (A) 02/16/2017 1132   APPEARANCEUR HAZY (A) 02/16/2017 1132   LABSPEC 1.028 02/16/2017 1132   PHURINE 5.0 02/16/2017 1132   GLUCOSEU NEGATIVE 02/16/2017 1132   HGBUR MODERATE (A) 02/16/2017 1132   BILIRUBINUR NEGATIVE 02/16/2017 1132  KETONESUR NEGATIVE 02/16/2017 1132   PROTEINUR 100 (A) 02/16/2017 1132   UROBILINOGEN 1.0 11/21/2014 1329   NITRITE NEGATIVE 02/16/2017 1132   LEUKOCYTESUR SMALL (A) 02/16/2017 1132   Sepsis Labs Invalid input(s): PROCALCITONIN,  WBC,  LACTICIDVEN Microbiology No results found for this or any previous visit (from the past 240 hour(s)).   Time coordinating discharge: 45 minutes/ patient left hospital [place].   SIGNED:   Tawni Millers, MD  [facility] 03/04/2017, 5:36 PM Pager   If 7PM-7AM, please contact night-coverage [email or web address] Password [email or web address]

## 2017-03-06 MED FILL — MYCOPHENOLIC ACID DR 180 MG: 180 | 30 days supply | Qty: 240 | Fill #0

## 2017-03-06 MED FILL — POTASSIUM CITRATE ER 15 MEQ: 15 MEQ | 30 days supply | Qty: 120 | Fill #0

## 2017-03-11 MED FILL — SULFAMETHOXAZOLE/TMP SS TAB: 400-80 | 28 days supply | Qty: 12 | Fill #4

## 2017-03-11 MED FILL — TACROLIMUS 1 MG CAPSULE: 1 | 30 days supply | Qty: 60 | Fill #4

## 2017-03-11 MED FILL — FENOFIBRATE 134 MG CAPSULE: 134 | 30 days supply | Qty: 30 | Fill #0

## 2017-03-11 MED FILL — ALLOPURINOL 100 MG TABLET: 100 | 30 days supply | Qty: 60 | Fill #5

## 2017-03-11 MED FILL — LABETALOL HCL 200 MG TABLET: 200 | 30 days supply | Qty: 60 | Fill #2

## 2017-03-12 NOTE — ED Provider Notes (Signed)
[facility] DEPT Provider Note   CSN: [id number] Arrival date & time: 03/01/17  2031     History   Chief Complaint Chief Complaint  Patient presents with  . Palpitations    HPI [name] is a 56 y.o. male.  HPI Patient has sudden onset of racing heart at about 5 PM. He has known history of atrial fibrillation and irregular heart rate. Patient took 460 mg doses of his Cardizem for a total of 240 mg of Cardizem between 5 and 7 PM. It was no improvement in symptoms. He reports he started to become weak and short of breath with chest pain. He states he started to feel pretty bad and knew he needed to come to the emergency department for treatment. Patient ports long history of early onset coronary artery disease and atrial fibrillation with history of renal transplant. Past Medical History:  Diagnosis Date  . CAD (coronary artery disease)    a. s/p CABG 03/2012: LIMA to LAD, free RIMA to OM1, SVG to D1, sequential SVG to AM and LPLB2, EVH via right thigh and leg.  . Cellulitis 04/13/2012   RLE saphenous vein harvest incision   . CKD (chronic kidney disease) stage 5, GFR less than 15 ml/min (HCC)    a. Diagnosed age 38, s/p yes transplant  . Dysrhythmia    A flutter, PACs and PVCs  . Elevated cholesterol   . Gout   . Hyperparathyroidism   . Hypertension   . Hypertrophic cardiomyopathy ([place])   . Migraines   . PAF and Flutter    a. Afib post-op CABG; AFlutter 10/2012  . Peripheral vascular disease ([place])   . Sinus node dysfunction-post termination pause    a. >8sec in setting of aflutter 10/2012  . Stroke [facility]) 1995; 2001   denies residual  . Superficial vein thrombosis    a. R greater saphenous 04/2012    Patient Active Problem List   Diagnosis Date Noted  . Atrial flutter ([place]) 03/01/2017  . Influenza B 02/17/2017  . SIRS (systemic inflammatory response syndrome) ([place]) 02/16/2017  . Renal transplant recipient 02/16/2017  . Immunosuppressed status ([place]) 02/16/2017    . UTI (urinary tract infection) 02/16/2017  . HOCM (hypertrophic obstructive cardiomyopathy) ([place]) 02/16/2017  . Thrombocytopenia ([place]) 02/16/2017  . Murmur 02/16/2014  . Long term (current) use of anticoagulants 11/02/2012  . NSTEMI (non-ST elevated myocardial infarction) ([place]) 10/28/2012  . CAD (coronary artery disease) of artery bypass graft 10/28/2012  . Sinus node dysfunction-post termination pause   . Stroke ([place])   . PAF and Aflutter 07/09/2012  . S/P CABG (coronary artery bypass graft) 05/12/2012  . HTN (hypertension) 01/15/2012  . Smoking 01/15/2012  . CKD (chronic kidney disease), stage III 01/15/2012    Past Surgical History:  Procedure Laterality Date  . ATRIAL FLUTTER ABLATION N/A 11/09/2012   Procedure: ATRIAL FLUTTER ABLATION;  Surgeon: [name], MD;  Location: [facility] CATH LAB;  Service: Cardiovascular;  Laterality: N/A;  . AV FISTULA PLACEMENT  2011   left forearm  . AV FISTULA PLACEMENT    . CARDIAC CATHETERIZATION  03/16/12  . CORONARY ARTERY BYPASS GRAFT  03/19/2012   Procedure: CORONARY ARTERY BYPASS GRAFTING (CABG);  Surgeon: Rexene Alberts, MD;  Location: [facility];  Service: Open Heart Surgery;  Laterality: N/A;  (B) MAMMARY  . CYSTOSCOPY WITH BIOPSY N/A 08/15/2016   Procedure: CYSTOSCOPY WITH IDENTIFICATION OF RIGHT TRANSPLANTATION OF URETRAL ORFICE WITH FLUORESCEIN;  Surgeon: Carolan Clines, MD;  Location: [facility] ORS;  Service: Urology;  Laterality: N/A;  . DENTAL SURGERY  2008   multiple  . [place]   left  . FRACTURE SURGERY    . HERNIA REPAIR  49/2010   umbilical  . HOLMIUM LASER APPLICATION N/A 0/71/2197   Procedure: HOLMIUM LASER APPLICATION WITH 3 CM RIGHT BLADDER STONE;  Surgeon: Carolan Clines, MD;  Location: [facility] ORS;  Service: Urology;  Laterality: N/A;  . INGUINAL HERNIA REPAIR  09/2009   bilaterally  . KIDNEY TRANSPLANT    . LEFT HEART CATHETERIZATION WITH CORONARY ANGIOGRAM N/A 03/16/2012   Procedure: LEFT HEART  CATHETERIZATION WITH CORONARY ANGIOGRAM;  Surgeon: Sherren Mocha, MD;  Location: [facility] CATH LAB;  Service: Cardiovascular;  Laterality: N/A;  . RENAL ARTERY STENT  2001  . TRANSURETHRAL RESECTION OF BLADDER TUMOR N/A 08/15/2016   Procedure: TRANSURETHRAL BIOPSY OF RIGHT BLADDER WALL;  Surgeon: Carolan Clines, MD;  Location: [facility] ORS;  Service: Urology;  Laterality: N/A;       Home Medications    Prior to Admission medications   Medication Sig Start Date End Date Taking? Authorizing Provider  allopurinol (ZYLOPRIM) 100 MG tablet Take 200 mg by mouth every morning.    Yes Historical Provider, MD  aspirin 81 MG EC tablet Take 1 tablet (81 mg total) by mouth daily. 10/28/12  Yes Rogelia Mire, NP  cloNIDine (CATAPRES) 0.2 MG tablet Take 0.2 mg by mouth every evening.    Yes Historical Provider, MD  diltiazem (CARDIZEM) 60 MG tablet Take 1 tablet (60 mg total) by mouth as needed (for palpitations). 01/08/17  Yes Josue Hector, MD  fenofibrate micronized (LOFIBRA) 134 MG capsule Take 134 mg by mouth daily.    Yes Historical Provider, MD  labetalol (NORMODYNE) 200 MG tablet Take 1 tablet (200 mg total) by mouth 2 (two) times daily. 07/11/16  Yes [name], MD  Magnesium 400 MG CAPS Take 400 mg by mouth daily.    Yes Historical Provider, MD  mycophenolate (MYFORTIC) 180 MG EC tablet Take 720 mg by mouth 2 (two) times daily.    Yes Historical Provider, MD  omega-3 acid ethyl esters (LOVAZA) 1 G capsule Take 2 g by mouth 2 (two) times daily.   Yes Historical Provider, MD  omeprazole (PRILOSEC) 20 MG capsule Take 20 mg by mouth daily. 12/12/15  Yes Historical Provider, MD  predniSONE (DELTASONE) 5 MG tablet Take 5 mg by mouth at bedtime.   Yes Historical Provider, MD  simvastatin (ZOCOR) 40 MG tablet Take 40 mg by mouth as directed. Take 1 tablet every day except for Fridays and Saturdays   Yes Historical Provider, MD  sulfamethoxazole-trimethoprim (BACTRIM,SEPTRA) 400-80 MG tablet Take 1 tablet  by mouth every Monday, Wednesday, and Friday. 02/10/17  Yes Historical Provider, MD  tacrolimus (PROGRAF) 1 MG capsule Take 1 mg by mouth 2 (two) times daily. 12/12/15  Yes Historical Provider, MD  Vitamin D, Ergocalciferol, (DRISDOL) 50000 units CAPS capsule Take 50,000 Units by mouth 2 (two) times a week. On Tuesdays and Saturdays 06/11/16  Yes Historical Provider, MD  oseltamivir (TAMIFLU) 75 MG capsule Take 1 capsule (75 mg total) by mouth 2 (two) times daily. For 4days Patient not taking: Reported on 02/24/2017 02/19/17   Domenic Polite, MD    Family History Family History  Problem Relation Age of Onset  . Adopted: Yes  . Family history unknown: Yes    Social History Social History  Substance Use Topics  . Smoking status: Former Smoker  Packs/day: 0.30    Years: 30.00    Types: Cigarettes    Quit date: 03/15/2012  . Smokeless tobacco: Never Used  . Alcohol use Yes     Comment: 04/13/12 "2 mixed drinks/month"     Allergies   Contrast media [iodinated diagnostic agents] and Penicillins   Review of Systems Review of Systems 10 Systems reviewed and are negative for acute change except as noted in the HPI.   Physical Exam Updated Vital Signs BP (!) 103/59   Pulse (!) 52   Temp 97.7 F (36.5 C) (Oral)   Resp 16   Ht 5\' 8"  (1.727 m)   Wt 188 lb (85.3 kg)   SpO2 98%   BMI 28.59 kg/m   Physical Exam  Constitutional: He is oriented to person, place, and time.  Patient is alert. He is slightly pale in appearance. Mental status is clear.  HENT:  Head: Normocephalic and atraumatic.  Eyes: EOM are normal.  Cardiovascular:  Extreme tachycardia. Cannot appreciate rub murmur gallop rate.  Pulmonary/Chest: Effort normal and breath sounds normal.  Abdominal: Soft. He exhibits no distension. There is no tenderness.  Musculoskeletal: Normal range of motion. He exhibits no tenderness.  Neurological: He is alert and oriented to person, place, and time. No cranial nerve deficit.  He exhibits normal muscle tone. Coordination normal.  Skin: Skin is warm and dry. There is pallor.  Psychiatric: He has a normal mood and affect.     ED Treatments / Results  Labs (all labs ordered are listed, but only abnormal results are displayed) Labs Reviewed  BASIC METABOLIC PANEL - Abnormal; Notable for the following:       Result Value   Glucose, Bld 135 (*)    BUN 21 (*)    Creatinine, Ser 1.53 (*)    GFR calc non Af Amer 50 (*)    GFR calc Af Amer 58 (*)    All other components within normal limits  CBC - Abnormal; Notable for the following:    WBC 10.8 (*)    RBC 6.10 (*)    MCH 25.4 (*)    All other components within normal limits  MAGNESIUM - Abnormal; Notable for the following:    Magnesium 1.6 (*)    All other components within normal limits  COMPREHENSIVE METABOLIC PANEL - Abnormal; Notable for the following:    Glucose, Bld 126 (*)    BUN 26 (*)    Creatinine, Ser 1.67 (*)    Total Protein 6.3 (*)    GFR calc non Af Amer 45 (*)    GFR calc Af Amer 52 (*)    All other components within normal limits  TROPONIN I - Abnormal; Notable for the following:    Troponin I 0.96 (*)    All other components within normal limits  TROPONIN I - Abnormal; Notable for the following:    Troponin I 2.19 (*)    All other components within normal limits  I-STAT CHEM 8, ED - Abnormal; Notable for the following:    BUN 27 (*)    Creatinine, Ser 1.50 (*)    Glucose, Bld 131 (*)    Hemoglobin 18.0 (*)    HCT 53.0 (*)    All other components within normal limits  PROTIME-INR  CBC  TSH  MAGNESIUM  I-STAT TROPOININ, ED    EKG  EKG Interpretation None       Radiology No results found.  Procedures Procedures (including critical care time) CRITICAL CARE  Performed by: Charlesetta Shanks   Total critical care time: 30 minutes  Critical care time was exclusive of separately billable procedures and treating other patients.  Critical care was necessary to treat or  prevent imminent or life-threatening deterioration.  Critical care was time spent personally by me on the following activities: development of treatment plan with patient and/or surrogate as well as nursing, discussions with consultants, evaluation of patient's response to treatment, examination of patient, obtaining history from patient or surrogate, ordering and performing treatments and interventions, ordering and review of laboratory studies, ordering and review of radiographic studies, pulse oximetry and re-evaluation of patient's condition. Medications Ordered in ED Medications  0.9 %  sodium chloride infusion ( Intravenous Stopped 03/02/17 1308)  metoprolol (LOPRESSOR) injection 5 mg (5 mg Intravenous Given 03/01/17 2119)  apixaban (ELIQUIS) tablet 5 mg (5 mg Oral Given 03/01/17 2245)  magnesium sulfate IVPB 2 g 50 mL (2 g Intravenous Given 03/02/17 0532)     Initial Impression / Assessment and Plan / ED Course  I have reviewed the triage vital signs and the nursing notes.  Pertinent labs & imaging results that were available during my care of the patient were reviewed by me and considered in my medical decision making (see chart for details).    Consult: Reviewed case with cardiology fellow who after review of chart felt that the patient should be treated with eliquis and cardioversion for unstable atrial flutter.  Patient's case reviewed again with cardiology fellow after he has spontaneously converted to sinus rhythm with bigeminy and PVCs. EKG reviewed. He felt patient may be candidate for discharge if remained stable. He advised if I felt the patient needed observation to contact hospitalist for observation.  Consult: [facility] for medical observation post-episode of atrial flutter RVR and persistent hypotension  Final Clinical Impressions(s) / ED Diagnoses   Final diagnoses:  None  Atrial flutter/fibrillation RVR Persistent Hypotension Severe Comorbid Illness  Patient  presented with symptomatically atrial flutter with chest pain and having taken 260 mg of Cardizem prior to arrival. Patient was given Lopressor IV without initial rate response. Patient continued to report chest pain and plan was made for emergent cardioversion. Just prior to planned cardioversion, patient spontaneously converted to a sinus rhythm with bigeminy and PVCs. After a period of emergency department observation, the patient continued to have persistent hypotension and frequent PVCs. At that time, I felt safest management was for monitored observation and rule out for MI given the patient's severe comorbid illness of early onset coronary artery disease and renal transplant.   New Prescriptions Discharge Medication List as of 03/02/2017  1:47 PM       Charlesetta Shanks, MD 03/12/17 1202

## 2017-03-13 ENCOUNTER — Ambulatory Visit (INDEPENDENT_AMBULATORY_CARE_PROVIDER_SITE_OTHER): Payer: 59 | Admitting: Physician Assistant

## 2017-03-13 VITALS — BP 132/84 | HR 72 | Ht 68.0 in | Wt 190.0 lb

## 2017-03-13 DIAGNOSIS — I484 Atypical atrial flutter: Secondary | ICD-10-CM

## 2017-03-13 DIAGNOSIS — R079 Chest pain, unspecified: Secondary | ICD-10-CM | POA: Diagnosis not present

## 2017-03-13 DIAGNOSIS — I2510 Atherosclerotic heart disease of native coronary artery without angina pectoris: Secondary | ICD-10-CM

## 2017-03-13 DIAGNOSIS — I10 Essential (primary) hypertension: Secondary | ICD-10-CM

## 2017-03-13 MED ORDER — APIXABAN 5 MG PO TABS: 5.0000 mg | ORAL_TABLET | Freq: Two times a day (BID) | ORAL | 5 refills | Status: DC

## 2017-03-13 MED FILL — ELIQUIS 5 MG TABLET: 5 | 30 days supply | Qty: 60 | Fill #0

## 2017-03-13 NOTE — Patient Instructions (Addendum)
Medication Instructions:   Your physician recommends that you continue on your current medications as directed. Please refer to the Current Medication list given to you today.   If you need a refill on your cardiac medications before your next appointment, please call your pharmacy.  Labwork: NONE ORDERED  TODAY    Testing/Procedures: Your physician has requested that you have a lexiscan myoview. For further information please visit [email or web address]. Please follow instruction sheet, as given.(BASED UPON RESULTS WILL DETERMINE IF YOUR APPT NEEDS TO MOVE UP SOONER)   Follow-Up:   IN 3 MONTHS WITH DR Caryl Comes ONLY   DR Johnsie Cancel IN [place]   Any Other Special Instructions Will Be Listed Below (If Applicable).

## 2017-03-13 NOTE — Progress Notes (Signed)
Cardiology Office Note Date:  03/13/2017  Patient ID:  [name], DOB Sep 27, 1961, MRN [id number] PCP:  Gennette Pac, MD  Cardiologist:  Dr. Johnsie Cancel Electrophysiologist: Dr. Caryl Comes    Chief Complaint: recurrent AFlutter  History of Present Illness: [name] is a 56 y.o. male with history of ESRF now s/p kidney transplant (2014) chronically on immunosupression as well as steroids with rejection issues intermittently, CAD w/CABG (2013) with p/o AFib, PAFlutter ablated in 2013, HTN, HLD, OSA not using CPAP, reported improved after significant weight loss,  and CVA, HCM, hx pf eythrocytosis w/phlebotomy historically, comes in today to be seen for Dr. Caryl Comes secondary to an ER visit recently with palpitations noted to be in AFlutter w/RVR.  He was given IV lopressor an spontaneously converted to SR, planned for admisison but ultimately he left AMA and f/u was arranged.  Last month he was admitted to the hospital with persistent fever and dx with SIRS, UTI,  influenza B, discharged 02/19/17 and then  03/02/17 with palpitations found in AFlutter.  He was last seen by Dr. Caryl Comes in September at that time, discussed palpitations, [facility] noting PACs/PVCs only , more recently saw Dr. Johnsie Cancel in February, doing well without changes to his therapy, he report post CABG stress in 2013 without ischemia.  The patient reports that he knew the second the tachycardia started, he did feel chest pressure, was difficult to feel comfortable because of it and the racing, mild SOB, no near syncope or syncope.  He took a dose of diltiazem and another 1/2 hour later took another, given no resolution he went to the hospital.  While there, he again, could tell the moment it stopped, felt much better particularly the chest heaviness/pressure resolved.  He did feel tired/wiped out for a couple days.  He has been feeling well otherwise (apart from the hospital stay/infection), he denies any kind of CP ,  heaviness/pressure outside of while in his rapid aflutter.  LABS 03/02/17 Trop I: 0.96 > 2.19 K+ 3.6 BUN/Creat 26/1.67 WBC 9.5 H/H 15/46 plts 1.484   Past Medical History:  Diagnosis Date  . CAD (coronary artery disease)    a. s/p CABG 03/2012: LIMA to LAD, free RIMA to OM1, SVG to D1, sequential SVG to AM and LPLB2, EVH via right thigh and leg.  . Cellulitis 04/13/2012   RLE saphenous vein harvest incision   . CKD (chronic kidney disease) stage 5, GFR less than 15 ml/min (HCC)    a. Diagnosed age 9, s/p yes transplant  . Dysrhythmia    A flutter, PACs and PVCs  . Elevated cholesterol   . Gout   . Hyperparathyroidism   . Hypertension   . Hypertrophic cardiomyopathy ([place])   . Migraines   . PAF and Flutter    a. Afib post-op CABG; AFlutter 10/2012  . Peripheral vascular disease ([place])   . Sinus node dysfunction-post termination pause    a. >8sec in setting of aflutter 10/2012  . Stroke [facility]) 1995; 2001   denies residual  . Superficial vein thrombosis    a. R greater saphenous 04/2012    Past Surgical History:  Procedure Laterality Date  . ATRIAL FLUTTER ABLATION N/A 11/09/2012   Procedure: ATRIAL FLUTTER ABLATION;  Surgeon: [name], MD;  Location: [facility] CATH LAB;  Service: Cardiovascular;  Laterality: N/A;  . AV FISTULA PLACEMENT  2011   left forearm  . AV FISTULA PLACEMENT    . CARDIAC CATHETERIZATION  03/16/12  . CORONARY ARTERY  BYPASS GRAFT  03/19/2012   Procedure: CORONARY ARTERY BYPASS GRAFTING (CABG);  Surgeon: Rexene Alberts, MD;  Location: [facility];  Service: Open Heart Surgery;  Laterality: N/A;  (B) MAMMARY  . CYSTOSCOPY WITH BIOPSY N/A 08/15/2016   Procedure: CYSTOSCOPY WITH IDENTIFICATION OF RIGHT TRANSPLANTATION OF URETRAL ORFICE WITH FLUORESCEIN;  Surgeon: Carolan Clines, MD;  Location: [facility] ORS;  Service: Urology;  Laterality: N/A;  . DENTAL SURGERY  2008   multiple  . [place]   left  . FRACTURE SURGERY    . HERNIA REPAIR  63/0160    umbilical  . HOLMIUM LASER APPLICATION N/A 12/10/3233   Procedure: HOLMIUM LASER APPLICATION WITH 3 CM RIGHT BLADDER STONE;  Surgeon: Carolan Clines, MD;  Location: [facility];  Service: Urology;  Laterality: N/A;  . INGUINAL HERNIA REPAIR  09/2009   bilaterally  . KIDNEY TRANSPLANT    . LEFT HEART CATHETERIZATION WITH CORONARY ANGIOGRAM N/A 03/16/2012   Procedure: LEFT HEART CATHETERIZATION WITH CORONARY ANGIOGRAM;  Surgeon: Sherren Mocha, MD;  Location: [facility] CATH LAB;  Service: Cardiovascular;  Laterality: N/A;  . RENAL ARTERY STENT  2001  . TRANSURETHRAL RESECTION OF BLADDER TUMOR N/A 08/15/2016   Procedure: TRANSURETHRAL BIOPSY OF RIGHT BLADDER WALL;  Surgeon: Carolan Clines, MD;  Location: [facility];  Service: Urology;  Laterality: N/A;    Current Outpatient Prescriptions  Medication Sig Dispense Refill  . allopurinol (ZYLOPRIM) 100 MG tablet Take 200 mg by mouth every morning.     Marland Kitchen aspirin 81 MG EC tablet Take 1 tablet (81 mg total) by mouth daily. 30 tablet   . cloNIDine (CATAPRES) 0.2 MG tablet Take 0.2 mg by mouth every evening.     . diltiazem (CARDIZEM) 60 MG tablet Take 1 tablet (60 mg total) by mouth as needed (for palpitations). 30 tablet 6  . fenofibrate micronized (LOFIBRA) 134 MG capsule Take 134 mg by mouth daily.     Marland Kitchen labetalol (NORMODYNE) 200 MG tablet Take 1 tablet (200 mg total) by mouth 2 (two) times daily. 180 tablet 1  . Magnesium 400 MG CAPS Take 400 mg by mouth daily.     . mycophenolate (MYFORTIC) 180 MG EC tablet Take 720 mg by mouth 2 (two) times daily.     Marland Kitchen omega-3 acid ethyl esters (LOVAZA) 1 G capsule Take 2 g by mouth 2 (two) times daily.    Marland Kitchen omeprazole (PRILOSEC) 20 MG capsule Take 20 mg by mouth daily.  5  . predniSONE (DELTASONE) 5 MG tablet Take 5 mg by mouth at bedtime.    . simvastatin (ZOCOR) 40 MG tablet Take 40 mg by mouth as directed. Take 1 tablet every day except for Fridays and Saturdays    . sulfamethoxazole-trimethoprim (BACTRIM,SEPTRA)  400-80 MG tablet Take 1 tablet by mouth every Monday, Wednesday, and Friday.  6  . tacrolimus (PROGRAF) 1 MG capsule Take 1 mg by mouth 2 (two) times daily.  5  . Vitamin D, Ergocalciferol, (DRISDOL) 50000 units CAPS capsule Take 50,000 Units by mouth 2 (two) times a week. On Tuesdays and Saturdays  5  . apixaban (ELIQUIS) 5 MG TABS tablet Take 1 tablet (5 mg total) by mouth 2 (two) times daily. 60 tablet 5  . Potassium Citrate 15 MEQ (1620 MG) TBCR Take 15 mg by mouth daily.  5   No current facility-administered medications for this visit.     Allergies:   Contrast media [iodinated diagnostic agents] and Penicillins   Social History:  The  patient  reports that he quit smoking about 4 years ago. His smoking use included Cigarettes. He has a 9.00 pack-year smoking history. He has never used smokeless tobacco. He reports that he drinks alcohol. He reports that he does not use drugs.   Family History:  The patient's He was adopted. Family history is unknown by patient.  ROS:  Please see the history of present illness.  All other systems are reviewed and otherwise negative.   PHYSICAL EXAM:  VS:  BP 132/84   Pulse 72   Ht 5\' 8"  (1.727 m)   Wt 190 lb (86.2 kg)   BMI 28.89 kg/m  BMI: Body mass index is 28.89 kg/m. Well nourished, well developed, in no acute distress  HEENT: normocephalic, atraumatic  Neck: no JVD, carotid bruits or masses Cardiac:  RRR; extrasystoles appreciated, soft SM, no rubs, or gallops Lungs: CTA b/l, no wheezing, rhonchi or rales  Abd: soft, nontender MS: no deformity or atrophy Ext:  no edema  Skin: warm and dry, no rash Neuro:  No gross deficits appreciated Psych: euthymic mood, full affect    EKG:  Done today and reviewed by myself, Dr. Burt Knack SR, 72bpm, frequent PACs 03/01/17 #1 Aflutter, 151bpm #2 SB, 58bpm, V bigemeny  03/02/17: TTE Study Conclusions - Left ventricle: Inferobasal hypokinesis The cavity size was   normal. Wall thickness was increased  in a pattern of mild LVH.   Systolic function was normal. The estimated ejection fraction was   in the range of 55% to 60%. - Aortic valve: There was mild regurgitation. - Mitral valve: There was mild regurgitation. - Atrial septum: No defect or patent foramen ovale was identified. - Impressions: Abnormal GLS -11. Impressions: - Abnormal GLS -11.   Recent Labs: 03/02/2017: ALT 23; BUN 26; Creatinine, Ser 1.67; Hemoglobin 15.0; Magnesium 2.1; Platelets 254; Potassium 3.6; Sodium 140; TSH 1.484  No results found for requested labs within last 8760 hours.   Estimated Creatinine Clearance: 54.6 mL/min (A) (by C-G formula based on SCr of 1.67 mg/dL (H)).   Wt Readings from Last 3 Encounters:  03/13/17 190 lb (86.2 kg)  03/02/17 188 lb (85.3 kg)  02/18/17 192 lb 1.6 oz (87.1 kg)     Other studies reviewed: Additional studies/records reviewed today include: summarized above  ASSESSMENT AND PLAN:  1. Recurrent PAFlutter     Hx of flutter ablation 2013     CHA2DS2Vasc is high at 4 and was started on Eliquis prior to him leaving AMA     LA was 57mm on his echo  Discussed with the patient medicines, taking his diltiazem BID routinely given he had recurrent AFlutter episode in 3-38months, he tells me that routine diltiazem interferes withhis Prograf and is unable to do that, discussed d/w Der. Caryl Comes ablation as a strategy, he would consider this if he has more issues going forward, we discussed if this episode was provoked with the SIRS/Flu he had just had.  For now, he is most comfortable monitoring the frequency of episodes.    His chads score is high, he has hx of a remote stroke, this seems to pre-date his CABG and known rhythm hx, is 4 with a stroke, 2 for CAD and HTN, the patient has been on coumadin historically without issues/bleeding, and prefers [place] alternative.  Will have him continue eliquis 5mg  BID, discussed with the patient, he understands and is agreeable.  Will have him see Dr.  Caryl Comes in 3 months, sooner if needed.  2. CAD     +  chest complaints when in his tachycardia, not otherwise, though his Trop was 2.1 is concerning     On ASA, BB, statin     I discussed the EKG's, echo, case with Dr. Burt Knack, recommend pursuing lexiscan stress test     He has f/u in Aug with Dr. Johnsie Cancel, will keep this, pending his stress test findings, if significant/ischemic will have him come in sooner.  3. HTN     No changes  4. Hx of HCM     Mild LVH on his echo       Disposition: as above.  Current medicines are reviewed at length with the patient today.  The patient did not have any concerns regarding medicines.  Steven Lasso, PA-C 03/13/2017 12:23 PM     [address] [place] [address] [phone] (office)  [phone] (fax)

## 2017-03-14 DIAGNOSIS — Z940 Kidney transplant status: Secondary | ICD-10-CM | POA: Diagnosis not present

## 2017-03-19 MED FILL — OMEPRAZOLE DR 20 MG CAPSULE: 20 | 30 days supply | Qty: 30 | Fill #0

## 2017-03-21 DIAGNOSIS — M109 Gout, unspecified: Secondary | ICD-10-CM | POA: Diagnosis not present

## 2017-03-21 DIAGNOSIS — R739 Hyperglycemia, unspecified: Secondary | ICD-10-CM | POA: Diagnosis not present

## 2017-03-21 DIAGNOSIS — Z940 Kidney transplant status: Secondary | ICD-10-CM | POA: Diagnosis not present

## 2017-03-21 DIAGNOSIS — Z7689 Persons encountering health services in other specified circumstances: Secondary | ICD-10-CM | POA: Diagnosis not present

## 2017-03-24 ENCOUNTER — Telehealth (HOSPITAL_COMMUNITY): Payer: Self-pay | Admitting: *Deleted

## 2017-03-24 NOTE — Telephone Encounter (Signed)
Patient given detailed instructions per Myocardial Perfusion Study Information Sheet for the test on 03/16/17 at 1000. Patient notified to arrive 15 minutes early and that it is imperative to arrive on time for appointment to keep from having the test rescheduled.  If you need to cancel or reschedule your appointment, please call the office within 24 hours of your appointment. Failure to do so may result in a cancellation of your appointment, and a $50 no show fee. Patient verbalized understanding.Athena Baltz, [place]

## 2017-03-26 ENCOUNTER — Ambulatory Visit (HOSPITAL_COMMUNITY): Payer: 59 | Attending: Cardiology

## 2017-03-26 DIAGNOSIS — R079 Chest pain, unspecified: Secondary | ICD-10-CM | POA: Insufficient documentation

## 2017-03-26 DIAGNOSIS — I2510 Atherosclerotic heart disease of native coronary artery without angina pectoris: Secondary | ICD-10-CM | POA: Insufficient documentation

## 2017-03-26 DIAGNOSIS — R002 Palpitations: Secondary | ICD-10-CM | POA: Diagnosis not present

## 2017-03-26 DIAGNOSIS — R9439 Abnormal result of other cardiovascular function study: Secondary | ICD-10-CM | POA: Diagnosis not present

## 2017-03-26 DIAGNOSIS — I10 Essential (primary) hypertension: Secondary | ICD-10-CM | POA: Diagnosis not present

## 2017-03-26 LAB — MYOCARDIAL PERFUSION IMAGING
CSEPPHR: 84 {beats}/min
RATE: 0.25
Rest HR: 63 {beats}/min
SDS: 2
SRS: 0
SSS: 2
TID: 1.03

## 2017-03-26 MED ORDER — TECHNETIUM TC 99M TETROFOSMIN IV KIT
10.9000 | PACK | Freq: Once | INTRAVENOUS | Status: AC | PRN
  Administered 2017-03-26: 10.9 via INTRAVENOUS
  Filled 2017-03-26: qty 11

## 2017-03-26 MED ORDER — REGADENOSON 0.4 MG/5ML IV SOLN
0.4000 mg | Freq: Once | INTRAVENOUS | Status: AC
  Administered 2017-03-26: 0.4 mg via INTRAVENOUS

## 2017-03-26 MED ORDER — TECHNETIUM TC 99M TETROFOSMIN IV KIT
32.9000 | PACK | Freq: Once | INTRAVENOUS | Status: AC | PRN
  Administered 2017-03-26: 32.9 via INTRAVENOUS
  Filled 2017-03-26: qty 33

## 2017-04-03 DIAGNOSIS — Z7689 Persons encountering health services in other specified circumstances: Secondary | ICD-10-CM | POA: Diagnosis not present

## 2017-04-03 DIAGNOSIS — I421 Obstructive hypertrophic cardiomyopathy: Secondary | ICD-10-CM | POA: Diagnosis not present

## 2017-04-03 DIAGNOSIS — I2510 Atherosclerotic heart disease of native coronary artery without angina pectoris: Secondary | ICD-10-CM | POA: Diagnosis not present

## 2017-04-03 DIAGNOSIS — Z940 Kidney transplant status: Secondary | ICD-10-CM | POA: Diagnosis not present

## 2017-04-03 DIAGNOSIS — R739 Hyperglycemia, unspecified: Secondary | ICD-10-CM | POA: Diagnosis not present

## 2017-04-03 DIAGNOSIS — N210 Calculus in bladder: Secondary | ICD-10-CM | POA: Diagnosis not present

## 2017-04-03 DIAGNOSIS — E785 Hyperlipidemia, unspecified: Secondary | ICD-10-CM | POA: Diagnosis not present

## 2017-04-03 DIAGNOSIS — E559 Vitamin D deficiency, unspecified: Secondary | ICD-10-CM | POA: Diagnosis not present

## 2017-04-03 DIAGNOSIS — I129 Hypertensive chronic kidney disease with stage 1 through stage 4 chronic kidney disease, or unspecified chronic kidney disease: Secondary | ICD-10-CM | POA: Diagnosis not present

## 2017-04-09 MED FILL — TACROLIMUS 1 MG CAPSULE: 1 | 30 days supply | Qty: 60 | Fill #5

## 2017-04-09 MED FILL — VIT D2 1.25 MG (50,000 UNIT: 1.25 MG | 35 days supply | Qty: 10 | Fill #3

## 2017-04-09 MED FILL — FENOFIBRATE 134 MG CAPSULE: 134 | 30 days supply | Qty: 30 | Fill #1

## 2017-04-09 MED FILL — LABETALOL HCL 200 MG TABLET: 200 | 30 days supply | Qty: 60 | Fill #3

## 2017-04-09 MED FILL — ELIQUIS 5 MG TABLET: 5 | 30 days supply | Qty: 60 | Fill #1

## 2017-04-09 MED FILL — cloNIDine HCL 0.2 MG TABS: 0.2 | 90 days supply | Qty: 90 | Fill #0

## 2017-04-09 MED FILL — MYCOPHENOLIC ACID DR 180 MG: 180 | 30 days supply | Qty: 240 | Fill #1

## 2017-04-09 MED FILL — SULFAMETHOXAZOLE/TMP SS TAB: 400-80 | 28 days supply | Qty: 12 | Fill #5

## 2017-04-15 DIAGNOSIS — Z940 Kidney transplant status: Secondary | ICD-10-CM | POA: Diagnosis not present

## 2017-04-16 MED FILL — ALLOPURINOL 100 MG TABLET: 100 | 30 days supply | Qty: 60 | Fill #0

## 2017-04-16 MED FILL — OMEPRAZOLE DR 20 MG CAPSULE: 20 | 30 days supply | Qty: 30 | Fill #1

## 2017-04-23 MED FILL — CIALIS 5 MG TABLET: 5 | 30 days supply | Qty: 30 | Fill #3

## 2017-04-23 MED FILL — POTASSIUM CITRATE ER 15 MEQ: 15 MEQ | 30 days supply | Qty: 120 | Fill #1

## 2017-04-30 MED FILL — OMEGA-3 ETHYL ESTERS 1 GM C: 1 | 90 days supply | Qty: 360 | Fill #0

## 2017-05-05 DIAGNOSIS — Z940 Kidney transplant status: Secondary | ICD-10-CM | POA: Diagnosis not present

## 2017-05-05 DIAGNOSIS — E785 Hyperlipidemia, unspecified: Secondary | ICD-10-CM | POA: Diagnosis not present

## 2017-05-05 DIAGNOSIS — E559 Vitamin D deficiency, unspecified: Secondary | ICD-10-CM | POA: Diagnosis not present

## 2017-05-07 MED FILL — LABETALOL HCL 200 MG TABLET: 200 | 30 days supply | Qty: 60 | Fill #4

## 2017-05-07 MED FILL — MYCOPHENOLIC ACID DR 180 MG: 180 | 30 days supply | Qty: 240 | Fill #2

## 2017-05-07 MED FILL — SULFAMETHOXAZOLE/TMP SS TAB: 400-80 | 28 days supply | Qty: 12 | Fill #6

## 2017-05-07 MED FILL — FENOFIBRATE 134 MG CAPSULE: 134 | 30 days supply | Qty: 30 | Fill #2

## 2017-05-07 MED FILL — ELIQUIS 5 MG TABLET: 5 | 30 days supply | Qty: 60 | Fill #2

## 2017-05-08 MED FILL — TACROLIMUS 1 MG CAPSULE: 1 | 30 days supply | Qty: 60 | Fill #0

## 2017-05-14 MED FILL — SIMVASTATIN 40 MG TABLET: 40 | 90 days supply | Qty: 90 | Fill #1

## 2017-05-14 MED FILL — VIT D2 1.25 MG (50,000 UNIT: 1.25 MG | 35 days supply | Qty: 10 | Fill #4

## 2017-05-14 MED FILL — OMEPRAZOLE DR 20 MG CAPSULE: 20 | 30 days supply | Qty: 30 | Fill #2

## 2017-05-14 MED FILL — ALLOPURINOL 100 MG TABLET: 100 | 30 days supply | Qty: 60 | Fill #1

## 2017-05-15 ENCOUNTER — Telehealth: Payer: 59 | Admitting: Physician Assistant

## 2017-05-15 DIAGNOSIS — J029 Acute pharyngitis, unspecified: Secondary | ICD-10-CM

## 2017-05-15 NOTE — Progress Notes (Signed)
Based on what you shared with me it looks like you have a condition that should be evaluated in a face to face office visit. Your symptoms sound related to pharyngitis (inflammation of the throat). This can be related to allergies, due to a viral infection or bacteria (strep throat). This requires an examination and throat swab to check for bacterial infection. I would recommend you be seen. Salt-water gargles, tylenol and throat lozenges can be beneficial for symptom relief while you are getting this assessed.  NOTE: Even if you have entered your credit card information for this eVisit, you will not be charged.   If you are having a true medical emergency please call 911.  If you need an urgent face to face visit, [facility] has four urgent care centers for your convenience.  If you need care fast and have a high deductible or no insurance consider:   [email or web address]  [phone]  [address], [place], [place] [address] 8 am to 8 pm Monday-Friday 10 am to 4 pm Saturday-Sunday   The following sites will take your  insurance:    . [facility] [place] a Provider at this Location  [address] [place], [place] [address] . 10 am to 8 pm Monday-Friday . 12 pm to 8 pm Saturday-Sunday   . [facility] at [place] a Provider at this Location  [address] [place], [place], [place] [address] . 8 am to 8 pm Monday-Friday . 9 am to 6 pm Saturday . 11 am to 6 pm Sunday   . [facility] at [place] Get Driving Directions  [address] [place], [place] [address] . 8 am to 8 pm Monday-Friday . 8 am to 4 pm Saturday-Sunday   Your e-visit answers were reviewed by a board certified advanced clinical practitioner to complete your personal care plan.  Thank you for using e-Visits.

## 2017-06-03 DIAGNOSIS — Z940 Kidney transplant status: Secondary | ICD-10-CM | POA: Diagnosis not present

## 2017-06-03 MED FILL — FENOFIBRATE 134 MG CAPSULE: 134 | 30 days supply | Qty: 30 | Fill #3

## 2017-06-03 MED FILL — TACROLIMUS 1 MG CAPSULE: 1 | 30 days supply | Qty: 60 | Fill #1

## 2017-06-03 MED FILL — ELIQUIS 5 MG TABLET: 5 | 30 days supply | Qty: 60 | Fill #3

## 2017-06-03 MED FILL — CIALIS 5 MG TABLET: 5 | 30 days supply | Qty: 30 | Fill #4

## 2017-06-03 MED FILL — MYCOPHENOLIC ACID DR 180 MG: 180 | 30 days supply | Qty: 240 | Fill #3

## 2017-06-03 MED FILL — SULFAMETHOXAZOLE/TMP SS TAB: 400-80 | 28 days supply | Qty: 12 | Fill #0

## 2017-06-03 MED FILL — LABETALOL HCL 200 MG TABLET: 200 | 30 days supply | Qty: 60 | Fill #5

## 2017-06-08 ENCOUNTER — Encounter (HOSPITAL_COMMUNITY): Payer: Self-pay | Admitting: Emergency Medicine

## 2017-06-08 ENCOUNTER — Other Ambulatory Visit: Payer: Self-pay

## 2017-06-08 ENCOUNTER — Inpatient Hospital Stay (HOSPITAL_COMMUNITY)
Admission: EM | Admit: 2017-06-08 | Discharge: 2017-06-11 | DRG: 309 | Disposition: A | Discharge status: Home / Self Care | Payer: 59 | Attending: Internal Medicine | Admitting: Internal Medicine

## 2017-06-08 ENCOUNTER — Emergency Department (HOSPITAL_COMMUNITY): Payer: 59

## 2017-06-08 DIAGNOSIS — I2510 Atherosclerotic heart disease of native coronary artery without angina pectoris: Secondary | ICD-10-CM | POA: Diagnosis not present

## 2017-06-08 DIAGNOSIS — I484 Atypical atrial flutter: Secondary | ICD-10-CM | POA: Diagnosis not present

## 2017-06-08 DIAGNOSIS — R002 Palpitations: Secondary | ICD-10-CM | POA: Diagnosis not present

## 2017-06-08 DIAGNOSIS — Z8249 Family history of ischemic heart disease and other diseases of the circulatory system: Secondary | ICD-10-CM

## 2017-06-08 DIAGNOSIS — Z7982 Long term (current) use of aspirin: Secondary | ICD-10-CM

## 2017-06-08 DIAGNOSIS — Z951 Presence of aortocoronary bypass graft: Secondary | ICD-10-CM | POA: Diagnosis not present

## 2017-06-08 DIAGNOSIS — I422 Other hypertrophic cardiomyopathy: Secondary | ICD-10-CM | POA: Diagnosis present

## 2017-06-08 DIAGNOSIS — R079 Chest pain, unspecified: Secondary | ICD-10-CM | POA: Diagnosis not present

## 2017-06-08 DIAGNOSIS — I1311 Hypertensive heart and chronic kidney disease without heart failure, with stage 5 chronic kidney disease, or end stage renal disease: Secondary | ICD-10-CM | POA: Diagnosis not present

## 2017-06-08 DIAGNOSIS — E7800 Pure hypercholesterolemia, unspecified: Secondary | ICD-10-CM | POA: Diagnosis present

## 2017-06-08 DIAGNOSIS — I959 Hypotension, unspecified: Secondary | ICD-10-CM | POA: Diagnosis not present

## 2017-06-08 DIAGNOSIS — I4891 Unspecified atrial fibrillation: Secondary | ICD-10-CM | POA: Diagnosis present

## 2017-06-08 DIAGNOSIS — Z940 Kidney transplant status: Secondary | ICD-10-CM

## 2017-06-08 DIAGNOSIS — Z87891 Personal history of nicotine dependence: Secondary | ICD-10-CM

## 2017-06-08 DIAGNOSIS — I4892 Unspecified atrial flutter: Secondary | ICD-10-CM | POA: Diagnosis not present

## 2017-06-08 DIAGNOSIS — Z7952 Long term (current) use of systemic steroids: Secondary | ICD-10-CM

## 2017-06-08 DIAGNOSIS — E876 Hypokalemia: Secondary | ICD-10-CM | POA: Diagnosis present

## 2017-06-08 DIAGNOSIS — I483 Typical atrial flutter: Secondary | ICD-10-CM | POA: Diagnosis not present

## 2017-06-08 DIAGNOSIS — I739 Peripheral vascular disease, unspecified: Secondary | ICD-10-CM | POA: Diagnosis not present

## 2017-06-08 DIAGNOSIS — I472 Ventricular tachycardia: Secondary | ICD-10-CM | POA: Diagnosis present

## 2017-06-08 DIAGNOSIS — M109 Gout, unspecified: Secondary | ICD-10-CM | POA: Diagnosis present

## 2017-06-08 DIAGNOSIS — Z79899 Other long term (current) drug therapy: Secondary | ICD-10-CM

## 2017-06-08 DIAGNOSIS — I480 Paroxysmal atrial fibrillation: Secondary | ICD-10-CM | POA: Diagnosis present

## 2017-06-08 DIAGNOSIS — I248 Other forms of acute ischemic heart disease: Secondary | ICD-10-CM | POA: Diagnosis not present

## 2017-06-08 DIAGNOSIS — I10 Essential (primary) hypertension: Secondary | ICD-10-CM | POA: Diagnosis not present

## 2017-06-08 DIAGNOSIS — N183 Chronic kidney disease, stage 3 (moderate): Secondary | ICD-10-CM | POA: Diagnosis present

## 2017-06-08 DIAGNOSIS — Z8673 Personal history of transient ischemic attack (TIA), and cerebral infarction without residual deficits: Secondary | ICD-10-CM

## 2017-06-08 DIAGNOSIS — I4729 Other ventricular tachycardia: Secondary | ICD-10-CM

## 2017-06-08 DIAGNOSIS — Z7901 Long term (current) use of anticoagulants: Secondary | ICD-10-CM

## 2017-06-08 HISTORY — DX: Hyperlipidemia, unspecified: E78.5

## 2017-06-08 LAB — URINALYSIS, ROUTINE W REFLEX MICROSCOPIC
Bacteria, UA: NONE SEEN
Bilirubin Urine: NEGATIVE
GLUCOSE, UA: NEGATIVE mg/dL
KETONES UR: NEGATIVE mg/dL
Nitrite: NEGATIVE
PH: 6 (ref 5.0–8.0)
Protein, ur: NEGATIVE mg/dL
Specific Gravity, Urine: 1.012 (ref 1.005–1.030)

## 2017-06-08 LAB — COMPREHENSIVE METABOLIC PANEL
ALT: 38 U/L (ref 17–63)
ANION GAP: 9 (ref 5–15)
AST: 29 U/L (ref 15–41)
Albumin: 3.6 g/dL (ref 3.5–5.0)
Alkaline Phosphatase: 37 U/L — ABNORMAL LOW (ref 38–126)
BUN: 21 mg/dL — ABNORMAL HIGH (ref 6–20)
CHLORIDE: 106 mmol/L (ref 101–111)
CO2: 24 mmol/L (ref 22–32)
CREATININE: 1.48 mg/dL — AB (ref 0.61–1.24)
Calcium: 9.4 mg/dL (ref 8.9–10.3)
GFR, EST NON AFRICAN AMERICAN: 52 mL/min — AB (ref >60)
Glucose, Bld: 130 mg/dL — ABNORMAL HIGH (ref 65–99)
POTASSIUM: 4 mmol/L (ref 3.5–5.1)
Sodium: 139 mmol/L (ref 135–145)
Total Bilirubin: 0.6 mg/dL (ref 0.3–1.2)
Total Protein: 6 g/dL — ABNORMAL LOW (ref 6.5–8.1)

## 2017-06-08 LAB — MRSA PCR SCREENING: MRSA BY PCR: NEGATIVE

## 2017-06-08 LAB — CBC WITH DIFFERENTIAL/PLATELET
Basophils Absolute: 0 10*3/uL (ref 0.0–0.1)
Basophils Relative: 0 %
EOS ABS: 0.1 10*3/uL (ref 0.0–0.7)
Eosinophils Relative: 1 %
HCT: 48 % (ref 39.0–52.0)
HEMOGLOBIN: 15.8 g/dL (ref 13.0–17.0)
LYMPHS ABS: 1 10*3/uL (ref 0.7–4.0)
LYMPHS PCT: 11 %
MCH: 26.9 pg (ref 26.0–34.0)
MCHC: 32.9 g/dL (ref 30.0–36.0)
MCV: 81.8 fL (ref 78.0–100.0)
Monocytes Absolute: 1.1 10*3/uL — ABNORMAL HIGH (ref 0.1–1.0)
Monocytes Relative: 13 %
NEUTROS PCT: 75 %
Neutro Abs: 6.7 10*3/uL (ref 1.7–7.7)
Platelets: 148 10*3/uL — ABNORMAL LOW (ref 150–400)
RBC: 5.87 MIL/uL — AB (ref 4.22–5.81)
RDW: 15.2 % (ref 11.5–15.5)
WBC: 8.8 10*3/uL (ref 4.0–10.5)

## 2017-06-08 LAB — BASIC METABOLIC PANEL
ANION GAP: 7 (ref 5–15)
BUN: 20 mg/dL (ref 6–20)
CALCIUM: 9.6 mg/dL (ref 8.9–10.3)
CHLORIDE: 106 mmol/L (ref 101–111)
CO2: 25 mmol/L (ref 22–32)
CREATININE: 1.6 mg/dL — AB (ref 0.61–1.24)
GFR calc non Af Amer: 48 mL/min — ABNORMAL LOW (ref >60)
GFR, EST AFRICAN AMERICAN: 55 mL/min — AB (ref >60)
Glucose, Bld: 140 mg/dL — ABNORMAL HIGH (ref 65–99)
Potassium: 4 mmol/L (ref 3.5–5.1)
Sodium: 138 mmol/L (ref 135–145)

## 2017-06-08 LAB — TSH: TSH: 0.697 u[IU]/mL (ref 0.350–4.500)

## 2017-06-08 LAB — TROPONIN I
TROPONIN I: 0.42 ng/mL — AB (ref <0.03)
TROPONIN I: 0.48 ng/mL — AB (ref <0.03)

## 2017-06-08 LAB — CBC
HCT: 54.3 % — ABNORMAL HIGH (ref 39.0–52.0)
HEMOGLOBIN: 17.7 g/dL — AB (ref 13.0–17.0)
MCH: 26.8 pg (ref 26.0–34.0)
MCHC: 32.6 g/dL (ref 30.0–36.0)
MCV: 82.3 fL (ref 78.0–100.0)
Platelets: 179 10*3/uL (ref 150–400)
RBC: 6.6 MIL/uL — AB (ref 4.22–5.81)
RDW: 15.3 % (ref 11.5–15.5)
WBC: 11 10*3/uL — ABNORMAL HIGH (ref 4.0–10.5)

## 2017-06-08 LAB — MAGNESIUM: Magnesium: 1.5 mg/dL — ABNORMAL LOW (ref 1.7–2.4)

## 2017-06-08 LAB — LACTIC ACID, PLASMA
LACTIC ACID, VENOUS: 1.9 mmol/L (ref 0.5–1.9)
Lactic Acid, Venous: 2.4 mmol/L (ref 0.5–1.9)
Lactic Acid, Venous: 1.7 mmol/L (ref 0.5–1.9)

## 2017-06-08 LAB — POCT I-STAT TROPONIN I: Troponin i, poc: 0.08 ng/mL (ref 0.00–0.08)

## 2017-06-08 MED ORDER — APIXABAN 5 MG PO TABS
5.0000 mg | ORAL_TABLET | Freq: Two times a day (BID) | ORAL | Status: DC
  Administered 2017-06-08 – 2017-06-11 (×6): 5 mg via ORAL
  Filled 2017-06-08 (×7): qty 1

## 2017-06-08 MED ORDER — PREDNISONE 5 MG PO TABS
5.0000 mg | ORAL_TABLET | Freq: Every day | ORAL | Status: DC
  Administered 2017-06-08 – 2017-06-10 (×3): 5 mg via ORAL
  Filled 2017-06-08 (×3): qty 1

## 2017-06-08 MED ORDER — OMEGA-3-ACID ETHYL ESTERS 1 G PO CAPS
2.0000 g | ORAL_CAPSULE | Freq: Two times a day (BID) | ORAL | Status: DC
  Administered 2017-06-08 – 2017-06-11 (×7): 2 g via ORAL
  Filled 2017-06-08 (×7): qty 2

## 2017-06-08 MED ORDER — SIMVASTATIN 40 MG PO TABS
40.0000 mg | ORAL_TABLET | ORAL | Status: DC
  Administered 2017-06-08 – 2017-06-10 (×3): 40 mg via ORAL
  Filled 2017-06-08 (×3): qty 1

## 2017-06-08 MED ORDER — MYCOPHENOLATE SODIUM 180 MG PO TBEC
720.0000 mg | DELAYED_RELEASE_TABLET | Freq: Two times a day (BID) | ORAL | Status: DC
  Administered 2017-06-08 – 2017-06-11 (×7): 720 mg via ORAL
  Filled 2017-06-08 (×7): qty 4

## 2017-06-08 MED ORDER — SULFAMETHOXAZOLE-TRIMETHOPRIM 400-80 MG PO TABS
1.0000 | ORAL_TABLET | ORAL | Status: DC
  Administered 2017-06-09 – 2017-06-11 (×2): 1 via ORAL
  Filled 2017-06-08 (×2): qty 1

## 2017-06-08 MED ORDER — ACETAMINOPHEN 325 MG PO TABS: 650.0000 mg | ORAL_TABLET | Freq: Four times a day (QID) | ORAL | Status: DC | PRN

## 2017-06-08 MED ORDER — PANTOPRAZOLE SODIUM 40 MG PO TBEC
40.0000 mg | DELAYED_RELEASE_TABLET | Freq: Every day | ORAL | Status: DC
  Administered 2017-06-08 – 2017-06-11 (×4): 40 mg via ORAL
  Filled 2017-06-08 (×5): qty 1

## 2017-06-08 MED ORDER — OMEGA-3-ACID ETHYL ESTERS 1 G PO CAPS: 2.0000 g | ORAL_CAPSULE | Freq: Two times a day (BID) | ORAL | Status: DC

## 2017-06-08 MED ORDER — ALLOPURINOL 100 MG PO TABS
200.0000 mg | ORAL_TABLET | Freq: Every day | ORAL | Status: DC
  Administered 2017-06-08 – 2017-06-11 (×4): 200 mg via ORAL
  Filled 2017-06-08 (×4): qty 2

## 2017-06-08 MED ORDER — AMIODARONE IV BOLUS ONLY 150 MG/100ML
150.0000 mg | Freq: Once | INTRAVENOUS | Status: AC
  Administered 2017-06-08: 150 mg via INTRAVENOUS
  Filled 2017-06-08: qty 100

## 2017-06-08 MED ORDER — ASPIRIN EC 81 MG PO TBEC
81.0000 mg | DELAYED_RELEASE_TABLET | Freq: Every day | ORAL | Status: DC
  Administered 2017-06-08 – 2017-06-10 (×3): 81 mg via ORAL
  Filled 2017-06-08 (×3): qty 1

## 2017-06-08 MED ORDER — CLONIDINE HCL 0.1 MG PO TABS
0.2000 mg | ORAL_TABLET | Freq: Every day | ORAL | Status: DC
  Administered 2017-06-08 – 2017-06-10 (×3): 0.2 mg via ORAL
  Filled 2017-06-08 (×3): qty 2

## 2017-06-08 MED ORDER — AMIODARONE HCL IN DEXTROSE 360-4.14 MG/200ML-% IV SOLN
60.0000 mg/h | INTRAVENOUS | Status: AC
Start: 2017-06-08 — End: 2017-06-08
  Administered 2017-06-08: 60 mg/h via INTRAVENOUS
  Filled 2017-06-08: qty 200

## 2017-06-08 MED ORDER — MAGNESIUM OXIDE 400 (241.3 MG) MG PO TABS
400.0000 mg | ORAL_TABLET | Freq: Two times a day (BID) | ORAL | Status: DC
  Administered 2017-06-08 – 2017-06-11 (×7): 400 mg via ORAL
  Filled 2017-06-08 (×7): qty 1

## 2017-06-08 MED ORDER — FENOFIBRATE 160 MG PO TABS
160.0000 mg | ORAL_TABLET | Freq: Every day | ORAL | Status: DC
  Administered 2017-06-08 – 2017-06-11 (×4): 160 mg via ORAL
  Filled 2017-06-08 (×4): qty 1

## 2017-06-08 MED ORDER — HYDROCORTISONE NA SUCCINATE PF 100 MG IJ SOLR
50.0000 mg | Freq: Once | INTRAMUSCULAR | Status: DC
  Filled 2017-06-08: qty 2

## 2017-06-08 MED ORDER — VITAMIN D (ERGOCALCIFEROL) 1.25 MG (50000 UNIT) PO CAPS
50000.0000 [IU] | ORAL_CAPSULE | ORAL | Status: DC
  Administered 2017-06-10: 50000 [IU] via ORAL
  Filled 2017-06-08: qty 1

## 2017-06-08 MED ORDER — AMIODARONE HCL IN DEXTROSE 360-4.14 MG/200ML-% IV SOLN
30.0000 mg/h | INTRAVENOUS | Status: DC
  Filled 2017-06-08: qty 200

## 2017-06-08 MED ORDER — AMIODARONE LOAD VIA INFUSION
150.0000 mg | Freq: Once | INTRAVENOUS | Status: AC
  Administered 2017-06-08: 150 mg via INTRAVENOUS
  Filled 2017-06-08: qty 83.34

## 2017-06-08 MED ORDER — ACETAMINOPHEN 650 MG RE SUPP: 650.0000 mg | Freq: Four times a day (QID) | RECTAL | Status: DC | PRN

## 2017-06-08 MED ORDER — POTASSIUM CHLORIDE CRYS ER 20 MEQ PO TBCR: 30.0000 meq | EXTENDED_RELEASE_TABLET | Freq: Two times a day (BID) | ORAL | Status: DC

## 2017-06-08 MED ORDER — SODIUM CHLORIDE 0.9 % IV BOLUS (SEPSIS)
500.0000 mL | Freq: Once | INTRAVENOUS | Status: AC
Start: 2017-06-08 — End: 2017-06-08
  Administered 2017-06-08: 500 mL via INTRAVENOUS

## 2017-06-08 MED ORDER — DILTIAZEM HCL 25 MG/5ML IV SOLN
10.0000 mg | Freq: Once | INTRAVENOUS | Status: AC
  Administered 2017-06-08: 10 mg via INTRAVENOUS
  Filled 2017-06-08: qty 5

## 2017-06-08 MED ORDER — MAGNESIUM SULFATE 2 GM/50ML IV SOLN
2.0000 g | Freq: Once | INTRAVENOUS | Status: AC
  Administered 2017-06-08: 2 g via INTRAVENOUS
  Filled 2017-06-08: qty 50

## 2017-06-08 MED ORDER — TACROLIMUS 1 MG PO CAPS
1.0000 mg | ORAL_CAPSULE | Freq: Two times a day (BID) | ORAL | Status: DC
  Administered 2017-06-08 – 2017-06-11 (×7): 1 mg via ORAL
  Filled 2017-06-08 (×7): qty 1

## 2017-06-08 MED ORDER — DILTIAZEM HCL 25 MG/5ML IV SOLN
15.0000 mg | Freq: Once | INTRAVENOUS | Status: DC
  Filled 2017-06-08: qty 5

## 2017-06-08 NOTE — ED Provider Notes (Signed)
[facility] DEPT Provider Note   CSN: [id number] Arrival date & time: 06/08/17  0012     History   Chief Complaint Chief Complaint  Patient presents with  . Chest Pain    HPI [name] is a 56 y.o. male.  HPI   56 yo M with PMHx CAD, HTN, HLD, pAFib/Flutter here with palpitations. Pt states he has been fighting a "cold" with mild cough for 3 weeks but was o/w well until around 8:30 PM this evening, when he developed acute onset of palpitations, SOB, and chest pain. Pt states his sx feel similar ot his prior episodes of aflutter. He has been taking Eliquis as prescribed. He has a mild, dull chest pressure which is typical for him during these episodes. No other med changes. He has not missed any Eliquis. He has associated moderate SOB as well as nausea. He has mild lightheadedness as well.  Past Medical History:  Diagnosis Date  . CAD (coronary artery disease)    a. s/p CABG 03/2012: LIMA to LAD, free RIMA to OM1, SVG to D1, sequential SVG to AM and LPLB2, EVH via right thigh and leg.  . Cellulitis 04/13/2012   RLE saphenous vein harvest incision   . CKD (chronic kidney disease) stage 5, GFR less than 15 ml/min (HCC)    a. Diagnosed age 41, s/p yes transplant  . Dysrhythmia    A flutter, PACs and PVCs  . Elevated cholesterol   . Gout   . Hyperparathyroidism   . Hypertension   . Hypertrophic cardiomyopathy ([place])   . Migraines   . PAF and Flutter    a. Afib post-op CABG; AFlutter 10/2012  . Peripheral vascular disease ([place])   . Sinus node dysfunction-post termination pause    a. >8sec in setting of aflutter 10/2012  . Stroke [facility]) 1995; 2001   denies residual  . Superficial vein thrombosis    a. R greater saphenous 04/2012    Patient Active Problem List   Diagnosis Date Noted  . Atrial fibrillation with RVR ([place]) 06/08/2017  . Atrial flutter ([place]) 03/01/2017  . Influenza B 02/17/2017  . SIRS (systemic inflammatory response syndrome) ([place]) 02/16/2017  . Renal  transplant recipient 02/16/2017  . Immunosuppressed status ([place]) 02/16/2017  . UTI (urinary tract infection) 02/16/2017  . HOCM (hypertrophic obstructive cardiomyopathy) ([place]) 02/16/2017  . Thrombocytopenia ([place]) 02/16/2017  . Murmur 02/16/2014  . Long term (current) use of anticoagulants 11/02/2012  . NSTEMI (non-ST elevated myocardial infarction) ([place]) 10/28/2012  . CAD (coronary artery disease) of artery bypass graft 10/28/2012  . Sinus node dysfunction-post termination pause   . Stroke ([place])   . PAF and Aflutter 07/09/2012  . S/P CABG (coronary artery bypass graft) 05/12/2012  . HTN (hypertension) 01/15/2012  . Smoking 01/15/2012  . CKD (chronic kidney disease), stage III 01/15/2012    Past Surgical History:  Procedure Laterality Date  . ATRIAL FLUTTER ABLATION N/A 11/09/2012   Procedure: ATRIAL FLUTTER ABLATION;  Surgeon: [name], MD;  Location: [facility] CATH LAB;  Service: Cardiovascular;  Laterality: N/A;  . AV FISTULA PLACEMENT  2011   left forearm  . AV FISTULA PLACEMENT    . CARDIAC CATHETERIZATION  03/16/12  . CORONARY ARTERY BYPASS GRAFT  03/19/2012   Procedure: CORONARY ARTERY BYPASS GRAFTING (CABG);  Surgeon: Rexene Alberts, MD;  Location: [place];  Service: Open Heart Surgery;  Laterality: N/A;  (B) MAMMARY  . CYSTOSCOPY WITH BIOPSY N/A 08/15/2016   Procedure: CYSTOSCOPY WITH IDENTIFICATION OF  RIGHT TRANSPLANTATION OF URETRAL ORFICE WITH FLUORESCEIN;  Surgeon: Carolan Clines, MD;  Location: [facility] ORS;  Service: Urology;  Laterality: N/A;  . DENTAL SURGERY  2008   multiple  . [place]   left  . FRACTURE SURGERY    . HERNIA REPAIR  [id number]   umbilical  . HOLMIUM LASER APPLICATION N/A 6/46/8032   Procedure: HOLMIUM LASER APPLICATION WITH 3 CM RIGHT BLADDER STONE;  Surgeon: Carolan Clines, MD;  Location: [facility] ORS;  Service: Urology;  Laterality: N/A;  . INGUINAL HERNIA REPAIR  09/2009   bilaterally  . KIDNEY TRANSPLANT    . LEFT HEART  CATHETERIZATION WITH CORONARY ANGIOGRAM N/A 03/16/2012   Procedure: LEFT HEART CATHETERIZATION WITH CORONARY ANGIOGRAM;  Surgeon: Sherren Mocha, MD;  Location: [facility] CATH LAB;  Service: Cardiovascular;  Laterality: N/A;  . RENAL ARTERY STENT  2001  . TRANSURETHRAL RESECTION OF BLADDER TUMOR N/A 08/15/2016   Procedure: TRANSURETHRAL BIOPSY OF RIGHT BLADDER WALL;  Surgeon: Carolan Clines, MD;  Location: [facility] OR[facility];  Service: Urology;  Laterality: N/A;       Home Medications    Prior to Admission medications   Medication Sig Start Date End Date Taking? Authorizing Provider  allopurinol (ZYLOPRIM) 100 MG tablet Take 200 mg by mouth daily.    Yes [provider]  apixaban (ELIQUIS) 5 MG TABS tablet Take 1 tablet (5 mg total) by mouth 2 (two) times daily. 03/13/17  Yes Baldwin [place], PA-C  aspirin 81 MG EC tablet Take 1 tablet (81 mg total) by mouth daily. Patient taking differently: Take 81 mg by mouth at bedtime.  10/28/12  Yes Rogelia Mire, NP  cloNIDine (CATAPRES) 0.2 MG tablet Take 0.2 mg by mouth at bedtime.    Yes [provider]  diltiazem (CARDIZEM) 60 MG tablet Take 1 tablet (60 mg total) by mouth as needed (for palpitations). Patient taking differently: Take 60 mg by mouth 4 (four) times daily as needed (for palpitations).  01/08/17  Yes Josue Hector, MD  fenofibrate micronized (LOFIBRA) 134 MG capsule Take 134 mg by mouth daily.    Yes [provider]  labetalol (NORMODYNE) 200 MG tablet Take 1 tablet (200 mg total) by mouth 2 (two) times daily. 07/11/16  Yes [name], MD  magnesium oxide (MAG-OX) 400 (241.3 Mg) MG tablet Take 400 mg by mouth 2 (two) times daily.   Yes [provider]  mycophenolate (MYFORTIC) 180 MG EC tablet Take 720 mg by mouth 2 (two) times daily.    Yes [provider]  omega-3 acid ethyl esters (LOVAZA) 1 G capsule Take 2 g by mouth 2 (two) times daily.   Yes [provider]  omeprazole  (PRILOSEC) 20 MG capsule Take 20 mg by mouth daily.   Yes [provider]  potassium chloride SA (KLOR-CON M15) 15 MEQ tablet Take 30 mEq by mouth 2 (two) times daily.   Yes [provider]  predniSONE (DELTASONE) 5 MG tablet Take 5 mg by mouth at bedtime.   Yes [provider]  simvastatin (ZOCOR) 40 MG tablet Take 40 mg by mouth See admin instructions. Pt takes one tablet every day except Friday and Saturday.   Yes [provider]  sulfamethoxazole-trimethoprim (BACTRIM,SEPTRA) 400-80 MG tablet Take 1 tablet by mouth every Monday, Wednesday, and Friday.   Yes [provider]  tacrolimus (PROGRAF) 1 MG capsule Take 1 mg by mouth 2 (two) times daily.   Yes [provider]  Vitamin D, Ergocalciferol, (DRISDOL) 50000 units CAPS capsule Take 50,000 Units by mouth 2 (two) times a week. Pt takes on Tuesday and Saturday.   Yes [provider]    Family History Family History  Problem Relation Age of Onset  . Adopted: Yes  . Hypertension Other     Social History Social History  Substance Use Topics  . Smoking status: Former Smoker    Packs/day: 0.30    Years: 30.00    Types: Cigarettes    Quit date: 03/15/2012  . Smokeless tobacco: Never Used  . Alcohol use Yes     Comment: 04/13/12 "2 mixed drinks/month"     Allergies   Contrast media [iodinated diagnostic agents] and Penicillins   Review of Systems Review of Systems  Constitutional: Positive for fatigue.  Respiratory: Positive for chest tightness and shortness of breath.   Cardiovascular: Positive for palpitations.  Neurological: Positive for light-headedness.  All other systems reviewed and are negative.    Physical Exam Updated Vital Signs BP 118/76 (BP Location: Left Arm)   Pulse (!) 55   Temp 97.7 F (36.5 C) (Oral)   Resp 20   Ht 5\' 8"  (1.727 m)   Wt 87.6 kg (193 lb 2 oz)   SpO2 98%   BMI 29.36 kg/m   Physical Exam  Constitutional: He is oriented to  person, place, and time. He appears well-developed and well-nourished. He appears distressed.  HENT:  Head: Normocephalic and atraumatic.  Eyes: Conjunctivae are normal.  Neck: Neck supple.  Cardiovascular: Normal heart sounds.  An irregularly irregular rhythm present. Tachycardia present.  Exam reveals no friction rub.   No murmur heard. Pulmonary/Chest: Effort normal and breath sounds normal. No respiratory distress. He has no wheezes. He has no rales.  Abdominal: Soft. He exhibits no distension.  Musculoskeletal: He exhibits no edema.  Neurological: He is alert and oriented to person, place, and time. He exhibits normal muscle tone.  Skin: Skin is warm. Capillary refill takes less than 2 seconds.  Psychiatric: He has a normal mood and affect.  Nursing note and vitals reviewed.    ED Treatments / Results  Labs (all labs ordered are listed, but only abnormal results are displayed) Labs Reviewed  BASIC METABOLIC PANEL - Abnormal; Notable for the following:       Result Value   Glucose, Bld 140 (*)    Creatinine, Ser 1.60 (*)    GFR calc non Af Amer 48 (*)    GFR calc Af Amer 55 (*)    All other components within normal limits  CBC - Abnormal; Notable for the following:    WBC 11.0 (*)    RBC 6.60 (*)    Hemoglobin 17.7 (*)    HCT 54.3 (*)    All other components within normal limits  MAGNESIUM - Abnormal; Notable for the following:    Magnesium 1.5 (*)    All other components within normal limits  TROPONIN I - Abnormal; Notable for the following:    Troponin I 0.17 (*)    All other components within normal limits  COMPREHENSIVE METABOLIC PANEL - Abnormal; Notable for the following:    Glucose, Bld 130 (*)    BUN 21 (*)    Creatinine, Ser 1.48 (*)    Total Protein 6.0 (*)    Alkaline Phosphatase 37 (*)    GFR calc non Af Amer 52 (*)    All other components within normal limits  CBC WITH DIFFERENTIAL/PLATELET - Abnormal; Notable  for the following:    RBC 5.87 (*)     Platelets 148 (*)    Monocytes Absolute 1.1 (*)    All other components within normal limits  LACTIC ACID, PLASMA - Abnormal; Notable for the following:    Lactic Acid, Venous 2.4 (*)    All other components within normal limits  MRSA PCR SCREENING  TSH  TROPONIN I  TROPONIN I  URINALYSIS, ROUTINE W REFLEX MICROSCOPIC  LACTIC ACID, PLASMA  LACTIC ACID, PLASMA  I-STAT TROPOININ, ED  POCT I-STAT TROPONIN I    EKG  EKG Interpretation  Date/Time:  Sunday June 08 2017 02:06:35 EDT Ventricular Rate:  120 PR Interval:    QRS Duration: 120 QT Interval:  392 QTC Calculation: 554 R Axis:   -58 Text Interpretation:  Atrial fibrillation LVH with IVCD, LAD and secondary repol abnrm Prolonged QT interval Since last EKG, atrial fibrillation seems to have replaced atrial flutter There are persistent diffuse St-t changes concerning for demand Confirmed by Duffy Bruce [phone]) on 06/08/2017 8:22:28 AM       Radiology Dg Chest 2 View  Result Date: 06/08/2017 CLINICAL DATA:  Acute onset of generalized chest pain and tachycardia. Nausea and vomiting. Initial encounter. EXAM: CHEST  2 VIEW COMPARISON:  Chest radiograph performed 03/01/2017 FINDINGS: The lungs are well-aerated and clear. There is no evidence of focal opacification, pleural effusion or pneumothorax. The heart is borderline normal in size. The patient is status post median sternotomy, with evidence of prior CABG. No acute osseous abnormalities are seen. IMPRESSION: No acute cardiopulmonary process seen. Electronically Signed   By: Garald Balding M.D.   On: 06/08/2017 00:37    Procedures .Critical Care Performed by: Duffy Bruce Authorized by: Duffy Bruce   Critical care provider statement:    Critical care time (minutes):  45   (including critical care time) CRITICAL CARE Performed by: Evonnie Pat   Total critical care time: 35 minutes  Critical care time was exclusive of separately billable procedures and  treating other patients.  Critical care was necessary to treat or prevent imminent or life-threatening deterioration.  Critical care was time spent personally by me on the following activities: development of treatment plan with patient and/or surrogate as well as nursing, discussions with consultants, evaluation of patient's response to treatment, examination of patient, obtaining history from patient or surrogate, ordering and performing treatments and interventions, ordering and review of laboratory studies, ordering and review of radiographic studies, pulse oximetry and re-evaluation of patient's condition.    Medications Ordered in ED Medications  amiodarone (NEXTERONE) 1.8 mg/mL load via infusion 150 mg (150 mg Intravenous Bolus from Bag 06/08/17 0415)    Followed by  amiodarone (NEXTERONE PREMIX) 360-4.14 MG/200ML-% (1.8 mg/mL) IV infusion (0 mg/hr Intravenous Paused 06/08/17 0650)    Followed by  amiodarone (NEXTERONE PREMIX) 360-4.14 MG/200ML-% (1.8 mg/mL) IV infusion (0 mg/hr Intravenous Hold 06/08/17 1015)  magnesium oxide (MAG-OX) tablet 400 mg (400 mg Oral Given 06/08/17 0959)  apixaban (ELIQUIS) tablet 5 mg (5 mg Oral Given 06/08/17 0959)  predniSONE (DELTASONE) tablet 5 mg (not administered)  sulfamethoxazole-trimethoprim (BACTRIM,SEPTRA) 400-80 MG per tablet 1 tablet (not administered)  Vitamin D (Ergocalciferol) (DRISDOL) capsule 50,000 Units (not administered)  pantoprazole (PROTONIX) EC tablet 40 mg (40 mg Oral Given 06/08/17 0959)  tacrolimus (PROGRAF) capsule 1 mg (1 mg Oral Given 06/08/17 0959)  mycophenolate (MYFORTIC) EC tablet 720 mg (720 mg Oral Given 06/08/17 0959)  aspirin EC tablet 81 mg (not administered)  fenofibrate tablet  160 mg (160 mg Oral Given 06/08/17 1000)  simvastatin (ZOCOR) tablet 40 mg (not administered)  allopurinol (ZYLOPRIM) tablet 200 mg (200 mg Oral Given 06/08/17 1000)  acetaminophen (TYLENOL) tablet 650 mg (not administered)    Or  acetaminophen (TYLENOL)  suppository 650 mg (not administered)  omega-3 acid ethyl esters (LOVAZA) capsule 2 g (2 g Oral Given 06/08/17 1000)  diltiazem (CARDIZEM) injection 10 mg (10 mg Intravenous Given 06/08/17 0108)  sodium chloride 0.9 % bolus 500 mL (0 mLs Intravenous Stopped 06/08/17 0215)  amiodarone (NEXTERONE) IV bolus only 150 mg/100 mL (0 mg Intravenous Stopped 06/08/17 0300)  magnesium sulfate IVPB 2 g 50 mL (0 g Intravenous Stopped 06/08/17 0920)     Initial Impression / Assessment and Plan / ED Course  I have reviewed the triage vital signs and the nursing notes.  Pertinent labs & imaging results that were available during my care of the patient were reviewed by me and considered in my medical decision making (see chart for details).     56 yo M with PMHx as above here with acute onset palpitations, found to be in AFlutter/Fib with RVR. EKG shows diffuse ST-t changes, likely demand. Pt given dilt bolus x 1 with conversion to AFlutter with variable block but rate in the 70-80s. Discussed with Cardiology. No apparent electrolyte abnormalities. Given flutter with occasional episodes of frequent PVCs/nonsustained VT, will start on amio bolus and gtt. Given complex PMHx, cardiology Dr. Otelia Sergeant recommends hospitalist admit with consultation in AM. Pt in agreement. He feels significantly improved with rate control. No ongoing CP after rate <100.  Final Clinical Impressions(s) / ED Diagnoses   Final diagnoses:  Atypical atrial flutter [facility])    New Prescriptions Current Discharge Medication List       Duffy Bruce, MD 06/08/17 1001

## 2017-06-08 NOTE — ED Triage Notes (Signed)
Patient has had chest pain and rapid heart rate since 20:30. Patient took medications for the rapid heart beat but he could not get it to stop. Patient nauseas and vomiting.

## 2017-06-08 NOTE — Consult Note (Addendum)
Cardiology Consult Note  Admit date: 06/08/2017 Name: [name] 56 y.o.  male DOB:  16-Jul-1961 MRN:  [id number]  Today's date:  06/08/2017  Referring Physician:    [facility]  Primary cardiologist:   Dr. Maryellen Pile   Reason for Consultation:    Atrial flutter   IMPRESSIONS: 1.  Recurrent atrial flutter with rapid ventricular response 2.  Coronary artery disease with previous bypass grafting 5 years ago with recent stress testing showing a small focus of inferior ischemia thought to be low risk 3.  Chronic kidney disease with history of renal transplant 4.  Hypertensive heart disease 5.  Hyperlipidemia under treatment 6.  Long-term use of anticoagulation 7.  Mild elevation of troponin likely represents demand ischemia  RECOMMENDATION: He has had a second recurrence of atrial flutter following one earlier in March.  We'll discuss with EP about whether to proceed with ablation while he is in versus as an outpatient.  He has had recent echo as well as stress testing.  I spoke with Dr. Curt Bears who would like to keep the patient NPO after midnight.   There is no room on the schedule for an ablation.  He could be moved to [facility] tomorrow for a cardioversion if he does not convert to sinus today.   HISTORY: This very nice 56 year old male has a history of chronic kidney disease with renal transplant.  He had bypass grafting in 2013 and had a recent stress test that showed only a small focus of inferior ischemia  in April.  He had an episode of rapid atrial flutter that resolved in March of this year and had recently had sepsis prior to that that he thought precipitated it.  Following that he had a negative stress test and an echo done earlier this year showed mild LVH.  Last evening he had been out to eat and developed some midsternal chest pressure and noted that his heart was racing.  He presented to the emergency room with rapid atrial flutter and was treated with IV amiodarone and is  still in atrial flutter at this time.  He is not currently having any chest pain.  Troponin was mildly elevated on arrival.  His rate has significantly slowed.  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  He had a recent cold and he has not been able to shake it for the past 3 weeks.  He normally does not have exertional angina and denies PND, orthopnea or edema.  He has been on anticoagulation and has not missed doses.  He did have an atrial flutter ablation number of years ago.    Past Medical History:  Diagnosis Date  . CAD (coronary artery disease)    a. s/p CABG 03/2012: LIMA to LAD, free RIMA to OM1, SVG to D1, sequential SVG to AM and LPLB2, EVH via right thigh and leg.  . Cellulitis 04/13/2012   RLE saphenous vein harvest incision   . CKD (chronic kidney disease) stage 5, GFR less than 15 ml/min (HCC)    a. Diagnosed age 37, s/p yes transplant  . Dysrhythmia    A flutter, PACs and PVCs  . Elevated cholesterol   . Gout   . Hyperparathyroidism   . Hypertension   . Hypertrophic cardiomyopathy ([place])   . Migraines   . PAF and Flutter    a. Afib post-op CABG; AFlutter 10/2012  . Peripheral vascular disease ([place])   . Sinus node dysfunction-post termination pause  a. >8sec in setting of aflutter 10/2012  . Stroke [facility]) 1995; 2001   denies residual  . Superficial vein thrombosis    a. R greater saphenous 04/2012     Past Surgical History:  Procedure Laterality Date  . ATRIAL FLUTTER ABLATION N/A 11/09/2012   Procedure: ATRIAL FLUTTER ABLATION;  Surgeon: [name], MD;  Location: [facility] CATH LAB;  Service: Cardiovascular;  Laterality: N/A;  . AV FISTULA PLACEMENT  2011   left forearm  . AV FISTULA PLACEMENT    . CARDIAC CATHETERIZATION  03/16/12  . CORONARY ARTERY BYPASS GRAFT  03/19/2012   Procedure: CORONARY ARTERY BYPASS GRAFTING (CABG);  Surgeon: Rexene Alberts, MD;  Location: [place];  Service: Open Heart Surgery;  Laterality: N/A;   (B) MAMMARY  . CYSTOSCOPY WITH BIOPSY N/A 08/15/2016   Procedure: CYSTOSCOPY WITH IDENTIFICATION OF RIGHT TRANSPLANTATION OF URETRAL ORFICE WITH FLUORESCEIN;  Surgeon: Carolan Clines, MD;  Location: [facility];  Service: Urology;  Laterality: N/A;  . DENTAL SURGERY  2008   multiple  . [place]   left  . FRACTURE SURGERY    . HERNIA REPAIR  29/9242   umbilical  . HOLMIUM LASER APPLICATION N/A 6/83/4196   Procedure: HOLMIUM LASER APPLICATION WITH 3 CM RIGHT BLADDER STONE;  Surgeon: Carolan Clines, MD;  Location: [facility];  Service: Urology;  Laterality: N/A;  . INGUINAL HERNIA REPAIR  09/2009   bilaterally  . KIDNEY TRANSPLANT    . LEFT HEART CATHETERIZATION WITH CORONARY ANGIOGRAM N/A 03/16/2012   Procedure: LEFT HEART CATHETERIZATION WITH CORONARY ANGIOGRAM;  Surgeon: Sherren Mocha, MD;  Location[facility] LAB;  Service: Cardiovascular;  Laterality: N/A;  . RENAL ARTERY STENT  2001  . TRANSURETHRAL RESECTION OF BLADDER TUMOR N/A 08/15/2016   Procedure: TRANSURETHRAL BIOPSY OF RIGHT BLADDER WALL;  Surgeon: Carolan Clines, MD;  Location: [facility];  Service: Urology;  Laterality: N/A;    Allergies:  is allergic to contrast media [iodinated diagnostic agents] and penicillins.   Medications: Prior to Admission medications   Medication Sig Start Date End Date Taking? Authorizing Provider  allopurinol (ZYLOPRIM) 100 MG tablet Take 200 mg by mouth daily.    Yes [provider]  apixaban (ELIQUIS) 5 MG TABS tablet Take 1 tablet (5 mg total) by mouth 2 (two) times daily. 03/13/17  Yes Baldwin Jamaica, PA-C  aspirin 81 MG EC tablet Take 1 tablet (81 mg total) by mouth daily. Patient taking differently: Take 81 mg by mouth at bedtime.  10/28/12  Yes Rogelia Mire, NP  cloNIDine (CATAPRES) 0.2 MG tablet Take 0.2 mg by mouth at bedtime.    Yes [provider]  diltiazem (CARDIZEM) 60 MG tablet Take 1 tablet (60 mg total) by mouth as needed (for  palpitations). Patient taking differently: Take 60 mg by mouth 4 (four) times daily as needed (for palpitations).  01/08/17  Yes Josue Hector, MD  fenofibrate micronized (LOFIBRA) 134 MG capsule Take 134 mg by mouth daily.    Yes [provider]  labetalol (NORMODYNE) 200 MG tablet Take 1 tablet (200 mg total) by mouth 2 (two) times daily. 07/11/16  Yes [name], MD  magnesium oxide (MAG-OX) 400 (241.3 Mg) MG tablet Take 400 mg by mouth 2 (two) times daily.   Yes [provider]  mycophenolate (MYFORTIC) 180 MG EC tablet Take 720 mg by mouth 2 (two) times daily.    Yes [provider]  omega-3 acid ethyl esters (LOVAZA)  1 G capsule Take 2 g by mouth 2 (two) times daily.   Yes [provider]  omeprazole (PRILOSEC) 20 MG capsule Take 20 mg by mouth daily.   Yes [provider]  potassium chloride SA (KLOR-CON M15) 15 MEQ tablet Take 30 mEq by mouth 2 (two) times daily.   Yes [provider]  predniSONE (DELTASONE) 5 MG tablet Take 5 mg by mouth at bedtime.   Yes [provider]  simvastatin (ZOCOR) 40 MG tablet Take 40 mg by mouth See admin instructions. Pt takes one tablet every day except Friday and Saturday.   Yes [provider]  sulfamethoxazole-trimethoprim (BACTRIM,SEPTRA) 400-80 MG tablet Take 1 tablet by mouth every Monday, Wednesday, and Friday.   Yes [provider]  tacrolimus (PROGRAF) 1 MG capsule Take 1 mg by mouth 2 (two) times daily.   Yes [provider]  Vitamin D, Ergocalciferol, (DRISDOL) 50000 units CAPS capsule Take 50,000 Units by mouth 2 (two) times a week. Pt takes on Tuesday and Saturday.   Yes [provider]    Family History: Family Status  Relation Status  . Mother Alive  . Father Alive  . Other (Not Specified)    Social History:   reports that he quit smoking about 5 years ago. His smoking use included Cigarettes. He has a 9.00 pack-year smoking history. He  has never used smokeless tobacco. He reports that he drinks alcohol. He reports that he does not use drugs.   Works as a Chartered loss adjuster at [employer],  Review of Systems: He has erectile dysfunction.  He has had a recent upper respiratory infection that he has not been able to shake.  No recent angina.  Other than as noted above the remainder of the review of systems is unremarkable.  Physical Exam: BP 118/76 (BP Location: Left Arm)   Pulse (!) 55   Temp 97.7 F (36.5 C) (Oral)   Resp 20   Ht 5\' 8"  (1.727 m)   Wt 87.6 kg (193 lb 2 oz)   SpO2 98%   BMI 29.36 kg/m   General appearance: Pleasant white male currently in no acute distress Head: Normocephalic, without obvious abnormality, atraumatic, Balding male hair pattern Eyes: conjunctivae/corneas clear. PERRL, EOM's intact. Fundi not examined k: no adenopathy, no carotid bruit, no JVD and supple, symmetrical, trachea midline Lungs: clear to auscultation bilaterally, Healed median sternotomy scar  Heart: Irregular rate and rhythm, normal S1 and S2, no S3 Abdomen: soft, non-tender; bowel sounds normal; no masses,  no organomegaly Rectal: deferred Extremities: extremities normal, atraumatic, no cyanosis or edema Pulses: 2+ and symmetric Skin: Skin color, texture, turgor normal. No rashes or lesions Neurologic: Grossly normal Psych: Alert and oriented x 3 Labs: CBC  Recent Labs  06/08/17 0527  WBC 8.8  RBC 5.87*  HGB 15.8  HCT 48.0  PLT 148*  MCV 81.8  MCH 26.9  MCHC 32.9  RDW 15.2  LYMPHSABS 1.0  MONOABS 1.1*  EOSABS 0.1  BASOSABS 0.0   CMP   Recent Labs  06/08/17 0527  NA 139  K 4.0  CL 106  CO2 24  GLUCOSE 130*  BUN 21*  CREATININE 1.48*  CALCIUM 9.4  PROT 6.0*  ALBUMIN 3.6  AST 29  ALT 38  ALKPHOS 37*  BILITOT 0.6  GFRNONAA 52*  GFRAA >60   BNP (last 3 results) BNP    Component Value Date/Time   BNP 339.9 (H) 05/18/2015 1849   Cardiac Panel (last  3 results) Troponin  (Point of Care Test)  Recent Labs  06/08/17 0048  TROPIPOC 0.08   Cardiac Panel (last 3 results)  Recent Labs  06/08/17 0527  TROPONINI 0.17*     Radiology:  Borderline normal heart size, mild cardiomegaly  EKG: Atrial flutter with rapid ventricular response, IV conduction delay, nonspecific ST changes. Independently reviewed by me  Signed:  W. Doristine Church MD [facility]   Cardiology Consultant  06/08/2017, 10:19 AM

## 2017-06-08 NOTE — Progress Notes (Signed)
PROGRESS NOTE    [name]  XLK:[id number] DOB: Sep 14, 1961 DOA: 06/08/2017 PCP: Hulan Fess, MD   Brief Narrative: [name] is a 56 y.o. male with history of atrial fibrillation, CAD status post CABG, renal transplant on immunosuppressants presents to the ER with complaints of palpitations which started last night around 9 PM. Patient states he has been having some upper respiratory tract symptoms over the last few days. Denies any productive cough or fever or chills. Along with the palpitations patient had chest discomfort. Denies any shortness of breath dizziness or loss of consciousness.   ED Course: In the ER patient was initially found to be in atrial flutter with RVR and was given Cardizem bolus following which patient became A. fib with RVR with hypotension. ER physician discussed with on-call cardiologist who advised patient to be started on amiodarone infusion. Patient is being admitted for further management of A. fib with RVR.   Assessment & Plan:   Principal Problem:   Atrial fibrillation with RVR (HCC) Active Problems:   HTN (hypertension)   S/P CABG (coronary artery bypass graft)   Renal transplant recipient  1-A fib; RVR; became hypotensive with Cardizem. Now on amiodarone Gtt.  He is already on Eliquis.  Cardiology consulted.  Holding labetalol and Cardizem due to hypotension.  Amiodarone on hold now because HR was in the 50--40.  He denies chest pain, or palpitation.   2-Renal transplant with chronic kidney disease stage III - cr baseline 1.5--.16 Hold stress dose of steroids, patient report tachycardia from steroids. Monitor BP. No others symptoms of adrenal insufficiency.  Continue with prednisone, Prograf, and antibiotics prophylaxis Bactrim.  Cr at 1.4  CAD status post CABG , Mildly elevated troponin;  In setting of A fib.  Cardiology consulted.  Continue with aspirin and statins.   History of gout on allopurinol.  HTN; holding clonidine,  labetalol and Cardizem due to hypotension.   Hypomagnesemia;  IV mg ordered.   DVT prophylaxis: Eliquis Code Status: Full code.  Family Communication: Care discussed with patient.  Disposition Plan: remain inpatient for evaluation , treatment of A fib.    Consultants:   Cardiology    Procedures:  none  Antimicrobials:  none  Subjective: He denies chest pain, or palpitation. Feels better today. Only mild headache.    Objective: Vitals:   06/08/17 0400 06/08/17 0500 06/08/17 0530 06/08/17 0600  BP: 109/83 111/81 104/60 (!) 95/57  Pulse: 66 70 69 63  Resp: 20 19 18 19   Temp:      TempSrc:      SpO2: 97% 96% 99% 99%  Weight:    87.6 kg (193 lb 2 oz)  Height:    5\' 8"  (1.727 m)    Intake/Output Summary (Last 24 hours) at 06/08/17 0703 Last data filed at 06/08/17 0600  Gross per 24 hour  Intake           593.96 ml  Output                0 ml  Net           593.96 ml   Filed Weights   06/08/17 0035 06/08/17 0600  Weight: 84.8 kg (187 lb) 87.6 kg (193 lb 2 oz)    Examination:  General exam: Appears calm and comfortable  Respiratory system: Clear to auscultation. Respiratory effort normal. Cardiovascular system: S1 & S2 heard, RRR. No JVD, murmurs, rubs, gallops or clicks. No pedal edema. Gastrointestinal system: Abdomen is nondistended,  soft and nontender. No organomegaly or masses felt. Normal bowel sounds heard. Central nervous system: Alert and oriented. No focal neurological deficits. Extremities: Symmetric 5 x 5 power. Skin: No rashes, lesions or ulcers Psychiatry: Judgement and insight appear normal. Mood & affect appropriate.     Data Reviewed: I have personally reviewed following labs and imaging studies  CBC:  Recent Labs Lab 06/08/17 0043 06/08/17 0527  WBC 11.0* 8.8  NEUTROABS  --  6.7  HGB 17.7* 15.8  HCT 54.3* 48.0  MCV 82.3 81.8  PLT 179 426*   Basic Metabolic Panel:  Recent Labs Lab 06/08/17 0043 06/08/17 0526 06/08/17 0527    NA 138  --  139  K 4.0  --  4.0  CL 106  --  106  CO2 25  --  24  GLUCOSE 140*  --  130*  BUN 20  --  21*  CREATININE 1.60*  --  1.48*  CALCIUM 9.6  --  9.4  MG  --  1.5*  --    GFR: Estimated Creatinine Clearance: 62.1 mL/min (A) (by C-G formula based on SCr of 1.48 mg/dL (H)). Liver Function Tests:  Recent Labs Lab 06/08/17 0527  AST 29  ALT 38  ALKPHOS 37*  BILITOT 0.6  PROT 6.0*  ALBUMIN 3.6   No results for input(s): LIPASE, AMYLASE in the last 168 hours. No results for input(s): AMMONIA in the last 168 hours. Coagulation Profile: No results for input(s): INR, PROTIME in the last 168 hours. Cardiac Enzymes:  Recent Labs Lab 06/08/17 0527  TROPONINI 0.17*   BNP (last 3 results) No results for input(s): PROBNP in the last 8760 hours. HbA1C: No results for input(s): HGBA1C in the last 72 hours. CBG: No results for input(s): GLUCAP in the last 168 hours. Lipid Profile: No results for input(s): CHOL, HDL, LDLCALC, TRIG, CHOLHDL, LDLDIRECT in the last 72 hours. Thyroid Function Tests: No results for input(s): TSH, T4TOTAL, FREET4, T3FREE, THYROIDAB in the last 72 hours. Anemia Panel: No results for input(s): VITAMINB12, FOLATE, FERRITIN, TIBC, IRON, RETICCTPCT in the last 72 hours. Sepsis Labs: No results for input(s): PROCALCITON, LATICACIDVEN in the last 168 hours.  No results found for this or any previous visit (from the past 240 hour(s)).       Radiology Studies: Dg Chest 2 View  Result Date: 06/08/2017 CLINICAL DATA:  Acute onset of generalized chest pain and tachycardia. Nausea and vomiting. Initial encounter. EXAM: CHEST  2 VIEW COMPARISON:  Chest radiograph performed 03/01/2017 FINDINGS: The lungs are well-aerated and clear. There is no evidence of focal opacification, pleural effusion or pneumothorax. The heart is borderline normal in size. The patient is status post median sternotomy, with evidence of prior CABG. No acute osseous abnormalities are  seen. IMPRESSION: No acute cardiopulmonary process seen. Electronically Signed   By: Garald Balding M.D.   On: 06/08/2017 00:37        Scheduled Meds: . allopurinol  200 mg Oral Daily  . apixaban  5 mg Oral BID  . aspirin EC  81 mg Oral QHS  . fenofibrate  160 mg Oral Daily  . hydrocortisone sod succinate (SOLU-CORTEF) inj  50 mg Intravenous Once  . magnesium oxide  400 mg Oral BID  . mycophenolate  720 mg Oral BID  . omega-3 acid ethyl esters  2 g Oral BID  . pantoprazole  40 mg Oral Daily  . predniSONE  5 mg Oral QHS  . simvastatin  40 mg Oral Once per  day on Sun Mon Tue Wed Thu  . [START ON 06/09/2017] sulfamethoxazole-trimethoprim  1 tablet Oral Q M,W,F  . tacrolimus  1 mg Oral BID  . [START ON 06/10/2017] Vitamin D (Ergocalciferol)  50,000 Units Oral Once per day on Tue Sat   Continuous Infusions: . amiodarone Stopped (06/08/17 0650)   Followed by  . amiodarone    . magnesium sulfate 1 - 4 g bolus IVPB       LOS: 0 days    Time spent: 35 minutes.     Elmarie Shiley, MD [facility] Pager [phone]  If 7PM-7AM, please contact night-coverage [email or web address] Password [email or web address] 06/08/2017, 7:03 AM

## 2017-06-08 NOTE — Progress Notes (Signed)
CRITICAL VALUE ALERT  Critical Value:  troponin  Date & Time Notied:  06/08/17  0612  Provider Notified: Hal Hope  Orders Received/Actions taken: MD stated was an expected value since pt was in a-fib, pt asymptomatic at this time and on amiodarone gtt.

## 2017-06-08 NOTE — H&P (Signed)
History and Physical    [name] FMB:[id number] DOB: 14-Jul-1961 DOA: 06/08/2017  PCP: Hulan Fess, MD  Patient coming from: Home.  Chief Complaint: Palpitations.  HPI: [name] is a 56 y.o. male with history of atrial fibrillation, CAD status post CABG, renal transplant on immunosuppressants presents to the ER with complaints of palpitations which started last night around 9 PM. Patient states he has been having some upper respiratory tract symptoms over the last few days. Denies any productive cough or fever or chills. Along with the palpitations patient had chest discomfort. Denies any shortness of breath dizziness or loss of consciousness.   ED Course: In the ER patient was initially found to be in atrial flutter with RVR and was given Cardizem bolus following which patient became A. fib with RVR with hypotension. ER physician discussed with on-call cardiologist who advised patient to be started on amiodarone infusion. Patient is being admitted for further management of A. fib with RVR.  Review of Systems: As per HPI, rest all negative.   Past Medical History:  Diagnosis Date  . CAD (coronary artery disease)    a. s/p CABG 03/2012: LIMA to LAD, free RIMA to OM1, SVG to D1, sequential SVG to AM and LPLB2, EVH via right thigh and leg.  . Cellulitis 04/13/2012   RLE saphenous vein harvest incision   . CKD (chronic kidney disease) stage 5, GFR less than 15 ml/min (HCC)    a. Diagnosed age 38, s/p yes transplant  . Dysrhythmia    A flutter, PACs and PVCs  . Elevated cholesterol   . Gout   . Hyperparathyroidism   . Hypertension   . Hypertrophic cardiomyopathy ([place])   . Migraines   . PAF and Flutter    a. Afib post-op CABG; AFlutter 10/2012  . Peripheral vascular disease ([place])   . Sinus node dysfunction-post termination pause    a. >8sec in setting of aflutter 10/2012  . Stroke [facility]) 1995; 2001   denies residual  . Superficial vein thrombosis    a. R greater saphenous  04/2012    Past Surgical History:  Procedure Laterality Date  . ATRIAL FLUTTER ABLATION N/A 11/09/2012   Procedure: ATRIAL FLUTTER ABLATION;  Surgeon: [name], MD;  Location: [facility] CATH LAB;  Service: Cardiovascular;  Laterality: N/A;  . AV FISTULA PLACEMENT  2011   left forearm  . AV FISTULA PLACEMENT    . CARDIAC CATHETERIZATION  03/16/12  . CORONARY ARTERY BYPASS GRAFT  03/19/2012   Procedure: CORONARY ARTERY BYPASS GRAFTING (CABG);  Surgeon: Rexene Alberts, MD;  Location: [facility];  Service: Open Heart Surgery;  Laterality: N/A;  (B) MAMMARY  . CYSTOSCOPY WITH BIOPSY N/A 08/15/2016   Procedure: CYSTOSCOPY WITH IDENTIFICATION OF RIGHT TRANSPLANTATION OF URETRAL ORFICE WITH FLUORESCEIN;  Surgeon: Carolan Clines, MD;  Location: [facility];  Service: Urology;  Laterality: N/A;  . DENTAL SURGERY  2008   multiple  . [place]   left  . FRACTURE SURGERY    . HERNIA REPAIR  70/1779   umbilical  . HOLMIUM LASER APPLICATION N/A 3/90/3009   Procedure: HOLMIUM LASER APPLICATION WITH 3 CM RIGHT BLADDER STONE;  Surgeon: Carolan Clines, MD;  Location: [facility];  Service: Urology;  Laterality: N/A;  . INGUINAL HERNIA REPAIR  09/2009   bilaterally  . KIDNEY TRANSPLANT    . LEFT HEART CATHETERIZATION WITH CORONARY ANGIOGRAM N/A 03/16/2012   Procedure: LEFT HEART CATHETERIZATION WITH CORONARY ANGIOGRAM;  Surgeon:  Sherren Mocha, MD;  Location: [facility] CATH LAB;  Service: Cardiovascular;  Laterality: N/A;  . RENAL ARTERY STENT  2001  . TRANSURETHRAL RESECTION OF BLADDER TUMOR N/A 08/15/2016   Procedure: TRANSURETHRAL BIOPSY OF RIGHT BLADDER WALL;  Surgeon: Carolan Clines, MD;  Location: [facility];  Service: Urology;  Laterality: N/A;     reports that he quit smoking about 5 years ago. His smoking use included Cigarettes. He has a 9.00 pack-year smoking history. He has never used smokeless tobacco. He reports that he drinks alcohol. He reports that he does not use drugs.  Allergies    Allergen Reactions  . Contrast Media [Iodinated Diagnostic Agents] Other (See Comments)    Pt states that this medication causes him to code.   Marland Kitchen Penicillins Itching, Rash and Other (See Comments)    Has patient had a PCN reaction causing immediate rash, facial/tongue/throat swelling, SOB or lightheadedness with hypotension: No Has patient had a PCN reaction causing severe rash involving mucus membranes or skin necrosis: No Has patient had a PCN reaction that required hospitalization No Has patient had a PCN reaction occurring within the last 10 years: No If all of the above answers are "NO", then may proceed with Cephalosporin use.    Family History  Problem Relation Age of Onset  . Adopted: Yes  . Hypertension Other     Prior to Admission medications   Medication Sig Start Date End Date Taking? Authorizing Provider  allopurinol (ZYLOPRIM) 100 MG tablet Take 200 mg by mouth daily.    Yes [provider]  apixaban (ELIQUIS) 5 MG TABS tablet Take 1 tablet (5 mg total) by mouth 2 (two) times daily. 03/13/17  Yes Baldwin Jamaica, PA-C  aspirin 81 MG EC tablet Take 1 tablet (81 mg total) by mouth daily. Patient taking differently: Take 81 mg by mouth at bedtime.  10/28/12  Yes Rogelia Mire, NP  cloNIDine (CATAPRES) 0.2 MG tablet Take 0.2 mg by mouth at bedtime.    Yes [provider]  diltiazem (CARDIZEM) 60 MG tablet Take 1 tablet (60 mg total) by mouth as needed (for palpitations). Patient taking differently: Take 60 mg by mouth 4 (four) times daily as needed (for palpitations).  01/08/17  Yes Josue Hector, MD  fenofibrate micronized (LOFIBRA) 134 MG capsule Take 134 mg by mouth daily.    Yes [provider]  labetalol (NORMODYNE) 200 MG tablet Take 1 tablet (200 mg total) by mouth 2 (two) times daily. 07/11/16  Yes [name], MD  magnesium oxide (MAG-OX) 400 (241.3 Mg) MG tablet Take 400 mg by mouth 2 (two) times daily.   Yes [provider]  mycophenolate (MYFORTIC) 180 MG EC tablet Take 720 mg by mouth 2 (two) times daily.    Yes [provider]  omega-3 acid ethyl esters (LOVAZA) 1 G capsule Take 2 g by mouth 2 (two) times daily.   Yes [provider]  omeprazole (PRILOSEC) 20 MG capsule Take 20 mg by mouth daily.   Yes [provider]  potassium chloride SA (KLOR-CON M15) 15 MEQ tablet Take 30 mEq by mouth 2 (two) times daily.   Yes [provider]  predniSONE (DELTASONE) 5 MG tablet Take 5 mg by mouth at bedtime.   Yes [provider]  simvastatin (ZOCOR) 40 MG tablet Take 40 mg by mouth See admin instructions. Pt takes one tablet every day except Friday and Saturday.   Yes [provider]  sulfamethoxazole-trimethoprim Octavio Graves)  400-80 MG tablet Take 1 tablet by mouth every Monday, Wednesday, and Friday.   Yes [provider]  tacrolimus (PROGRAF) 1 MG capsule Take 1 mg by mouth 2 (two) times daily.   Yes [provider]  Vitamin D, Ergocalciferol, (DRISDOL) 50000 units CAPS capsule Take 50,000 Units by mouth 2 (two) times a week. Pt takes on Tuesday and Saturday.   Yes [provider]    Physical Exam: Vitals:   06/08/17 0300 06/08/17 0330 06/08/17 0400 06/08/17 0500  BP: 93/67 107/87 109/83 111/81  Pulse: 75 70 66 70  Resp: (!) 22 (!) 22 20 19   Temp:      TempSrc:      SpO2: 96% 98% 97% 96%  Weight:      Height:          Constitutional: Moderately built and nourished. Vitals:   06/08/17 0300 06/08/17 0330 06/08/17 0400 06/08/17 0500  BP: 93/67 107/87 109/83 111/81  Pulse: 75 70 66 70  Resp: (!) 22 (!) 22 20 19   Temp:      TempSrc:      SpO2: 96% 98% 97% 96%  Weight:      Height:       Eyes: Anicteric no pallor. ENMT: No discharge from the ears eyes nose and mouth. Neck: No mass felt. No neck rigidity. No JVD appreciated. Respiratory: No rhonchi or crepitations. Cardiovascular: S1 and S2 heard no  murmurs appreciated. Abdomen: Soft nontender bowel sounds present. No guarding or rigidity. Musculoskeletal: No edema. No joint effusion. Skin: No rash. Skin appears warm. Neurologic: Alert and awake oriented to time place and person. Moves all extremities. Psychiatric: Appears normal. Normal affect.   Labs on Admission: I have personally reviewed following labs and imaging studies  CBC:  Recent Labs Lab 06/08/17 0043  WBC 11.0*  HGB 17.7*  HCT 54.3*  MCV 82.3  PLT 096   Basic Metabolic Panel:  Recent Labs Lab 06/08/17 0043  NA 138  K 4.0  CL 106  CO2 25  GLUCOSE 140*  BUN 20  CREATININE 1.60*  CALCIUM 9.6   GFR: Estimated Creatinine Clearance: 56.6 mL/min (A) (by C-G formula based on SCr of 1.6 mg/dL (H)). Liver Function Tests: No results for input(s): AST, ALT, ALKPHOS, BILITOT, PROT, ALBUMIN in the last 168 hours. No results for input(s): LIPASE, AMYLASE in the last 168 hours. No results for input(s): AMMONIA in the last 168 hours. Coagulation Profile: No results for input(s): INR, PROTIME in the last 168 hours. Cardiac Enzymes: No results for input(s): CKTOTAL, CKMB, CKMBINDEX, TROPONINI in the last 168 hours. BNP (last 3 results) No results for input(s): PROBNP in the last 8760 hours. HbA1C: No results for input(s): HGBA1C in the last 72 hours. CBG: No results for input(s): GLUCAP in the last 168 hours. Lipid Profile: No results for input(s): CHOL, HDL, LDLCALC, TRIG, CHOLHDL, LDLDIRECT in the last 72 hours. Thyroid Function Tests: No results for input(s): TSH, T4TOTAL, FREET4, T3FREE, THYROIDAB in the last 72 hours. Anemia Panel: No results for input(s): VITAMINB12, FOLATE, FERRITIN, TIBC, IRON, RETICCTPCT in the last 72 hours. Urine analysis:    Component Value Date/Time   COLORURINE AMBER (A) 02/16/2017 1132   APPEARANCEUR HAZY (A) 02/16/2017 1132   LABSPEC 1.028 02/16/2017 1132   PHURINE 5.0 02/16/2017 1132   GLUCOSEU NEGATIVE 02/16/2017 1132    HGBUR MODERATE (A) 02/16/2017 1132   BILIRUBINUR NEGATIVE 02/16/2017 1132   KETONESUR NEGATIVE 02/16/2017 1132   PROTEINUR 100 (A) 02/16/2017 1132  UROBILINOGEN 1.0 11/21/2014 1329   NITRITE NEGATIVE 02/16/2017 1132   LEUKOCYTESUR SMALL (A) 02/16/2017 1132   Sepsis Labs: @LABRCNTIP (procalcitonin:4,lacticidven:4) )No results found for this or any previous visit (from the past 240 hour(s)).   Radiological Exams on Admission: Dg Chest 2 View  Result Date: 06/08/2017 CLINICAL DATA:  Acute onset of generalized chest pain and tachycardia. Nausea and vomiting. Initial encounter. EXAM: CHEST  2 VIEW COMPARISON:  Chest radiograph performed 03/01/2017 FINDINGS: The lungs are well-aerated and clear. There is no evidence of focal opacification, pleural effusion or pneumothorax. The heart is borderline normal in size. The patient is status post median sternotomy, with evidence of prior CABG. No acute osseous abnormalities are seen. IMPRESSION: No acute cardiopulmonary process seen. Electronically Signed   By: Garald Balding M.D.   On: 06/08/2017 00:37    EKG: Independently reviewed. If he with RVR.  Assessment/Plan Principal Problem:   Atrial fibrillation with RVR (HCC) Active Problems:   HTN (hypertension)   S/P CABG (coronary artery bypass graft)   Renal transplant recipient    1. A. fib with RVR - patient has been started on amiodarone infusion. Chads 2 vasc score is more than 2 and is on Apixaban. Cycle cardiac markers check TSH. 2. Renal transplant with chronic kidney disease stage III - since patient is on prednisone and patient's blood pressures in the low normal I have ordered 1 dose of stress dose steroid with continuation of his immunosuppressants. May need further doses of stress dose steroids based on response. Continue prophylactic antibiotic. 3. CAD status post CABG - patient did have some chest discomfort during palpitation. Cycle cardiac markers. Patient is on aspirin and  statins. 4. Hypertension - since patient's blood pressure is in the low-normal holding off clonidine and labetalol. Restart as soon as possible once blood pressure improves. 5. History of gout on allopurinol.  I have reviewed patient's old charts and labs.   DVT prophylaxis: Apixaban. Code Status: Full code.  Family Communication: Discussed with patient.  Disposition Plan: Home.  Consults called: ER physician had discussed with low  Admission status: Observation.    Rise Patience MD [facility] Pager [phone].  If 7PM-7AM, please contact night-coverage [email or web address] Password [email or web address]  06/08/2017, 5:17 AM

## 2017-06-08 NOTE — Progress Notes (Signed)
CRITICAL VALUE ALERT  Critical Value:  Lactic 2.4  Date & Time Notied:  06/08/17  Provider Notified: [phone]  Orders Received/Actions taken:

## 2017-06-09 ENCOUNTER — Other Ambulatory Visit: Payer: Self-pay

## 2017-06-09 DIAGNOSIS — Z951 Presence of aortocoronary bypass graft: Secondary | ICD-10-CM

## 2017-06-09 DIAGNOSIS — Z7982 Long term (current) use of aspirin: Secondary | ICD-10-CM | POA: Diagnosis not present

## 2017-06-09 DIAGNOSIS — Z8249 Family history of ischemic heart disease and other diseases of the circulatory system: Secondary | ICD-10-CM | POA: Diagnosis not present

## 2017-06-09 DIAGNOSIS — Z79899 Other long term (current) drug therapy: Secondary | ICD-10-CM | POA: Diagnosis not present

## 2017-06-09 DIAGNOSIS — E876 Hypokalemia: Secondary | ICD-10-CM | POA: Diagnosis present

## 2017-06-09 DIAGNOSIS — Z7901 Long term (current) use of anticoagulants: Secondary | ICD-10-CM | POA: Diagnosis not present

## 2017-06-09 DIAGNOSIS — I484 Atypical atrial flutter: Principal | ICD-10-CM

## 2017-06-09 DIAGNOSIS — I959 Hypotension, unspecified: Secondary | ICD-10-CM | POA: Diagnosis not present

## 2017-06-09 DIAGNOSIS — Z8673 Personal history of transient ischemic attack (TIA), and cerebral infarction without residual deficits: Secondary | ICD-10-CM | POA: Diagnosis not present

## 2017-06-09 DIAGNOSIS — Z7952 Long term (current) use of systemic steroids: Secondary | ICD-10-CM | POA: Diagnosis not present

## 2017-06-09 DIAGNOSIS — I480 Paroxysmal atrial fibrillation: Secondary | ICD-10-CM | POA: Diagnosis present

## 2017-06-09 DIAGNOSIS — I509 Heart failure, unspecified: Secondary | ICD-10-CM | POA: Diagnosis not present

## 2017-06-09 DIAGNOSIS — E7800 Pure hypercholesterolemia, unspecified: Secondary | ICD-10-CM | POA: Diagnosis present

## 2017-06-09 DIAGNOSIS — N183 Chronic kidney disease, stage 3 (moderate): Secondary | ICD-10-CM | POA: Diagnosis not present

## 2017-06-09 DIAGNOSIS — I2510 Atherosclerotic heart disease of native coronary artery without angina pectoris: Secondary | ICD-10-CM

## 2017-06-09 DIAGNOSIS — I10 Essential (primary) hypertension: Secondary | ICD-10-CM | POA: Diagnosis not present

## 2017-06-09 DIAGNOSIS — N182 Chronic kidney disease, stage 2 (mild): Secondary | ICD-10-CM | POA: Diagnosis not present

## 2017-06-09 DIAGNOSIS — I130 Hypertensive heart and chronic kidney disease with heart failure and stage 1 through stage 4 chronic kidney disease, or unspecified chronic kidney disease: Secondary | ICD-10-CM | POA: Diagnosis not present

## 2017-06-09 DIAGNOSIS — I483 Typical atrial flutter: Secondary | ICD-10-CM | POA: Diagnosis not present

## 2017-06-09 DIAGNOSIS — I248 Other forms of acute ischemic heart disease: Secondary | ICD-10-CM | POA: Diagnosis not present

## 2017-06-09 DIAGNOSIS — I4892 Unspecified atrial flutter: Secondary | ICD-10-CM | POA: Diagnosis not present

## 2017-06-09 DIAGNOSIS — I472 Ventricular tachycardia: Secondary | ICD-10-CM | POA: Diagnosis not present

## 2017-06-09 DIAGNOSIS — I422 Other hypertrophic cardiomyopathy: Secondary | ICD-10-CM | POA: Diagnosis present

## 2017-06-09 DIAGNOSIS — I4891 Unspecified atrial fibrillation: Secondary | ICD-10-CM | POA: Diagnosis not present

## 2017-06-09 DIAGNOSIS — I1311 Hypertensive heart and chronic kidney disease without heart failure, with stage 5 chronic kidney disease, or end stage renal disease: Secondary | ICD-10-CM | POA: Diagnosis present

## 2017-06-09 DIAGNOSIS — M109 Gout, unspecified: Secondary | ICD-10-CM | POA: Diagnosis present

## 2017-06-09 DIAGNOSIS — I739 Peripheral vascular disease, unspecified: Secondary | ICD-10-CM | POA: Diagnosis present

## 2017-06-09 DIAGNOSIS — Z87891 Personal history of nicotine dependence: Secondary | ICD-10-CM | POA: Diagnosis not present

## 2017-06-09 DIAGNOSIS — Z940 Kidney transplant status: Secondary | ICD-10-CM | POA: Diagnosis not present

## 2017-06-09 DIAGNOSIS — R002 Palpitations: Secondary | ICD-10-CM | POA: Diagnosis present

## 2017-06-09 LAB — TROPONIN I: TROPONIN I: 0.17 ng/mL — AB (ref <0.03)

## 2017-06-09 LAB — BASIC METABOLIC PANEL
ANION GAP: 9 (ref 5–15)
BUN: 22 mg/dL — ABNORMAL HIGH (ref 6–20)
CO2: 22 mmol/L (ref 22–32)
Calcium: 9.4 mg/dL (ref 8.9–10.3)
Chloride: 105 mmol/L (ref 101–111)
Creatinine, Ser: 1.65 mg/dL — ABNORMAL HIGH (ref 0.61–1.24)
GFR calc Af Amer: 53 mL/min — ABNORMAL LOW (ref >60)
GFR, EST NON AFRICAN AMERICAN: 46 mL/min — AB (ref >60)
GLUCOSE: 126 mg/dL — AB (ref 65–99)
POTASSIUM: 3.6 mmol/L (ref 3.5–5.1)
SODIUM: 136 mmol/L (ref 135–145)

## 2017-06-09 LAB — MAGNESIUM: MAGNESIUM: 1.6 mg/dL — AB (ref 1.7–2.4)

## 2017-06-09 MED ORDER — ALPRAZOLAM 0.25 MG PO TABS: 0.2500 mg | ORAL_TABLET | Freq: Two times a day (BID) | ORAL | Status: DC | PRN

## 2017-06-09 MED ORDER — SODIUM CHLORIDE 0.9 % IV BOLUS (SEPSIS)
250.0000 mL | Freq: Once | INTRAVENOUS | Status: AC
  Administered 2017-06-09: 250 mL via INTRAVENOUS

## 2017-06-09 MED ORDER — METOPROLOL TARTRATE 5 MG/5ML IV SOLN
5.0000 mg | Freq: Once | INTRAVENOUS | Status: AC
  Administered 2017-06-09: 5 mg via INTRAVENOUS

## 2017-06-09 MED ORDER — DILTIAZEM HCL-DEXTROSE 100-5 MG/100ML-% IV SOLN (PREMIX)
5.0000 mg/h | INTRAVENOUS | Status: DC
  Administered 2017-06-09: 5 mg/h via INTRAVENOUS
  Filled 2017-06-09: qty 100

## 2017-06-09 MED ORDER — MORPHINE SULFATE (PF) 2 MG/ML IV SOLN
0.5000 mg | INTRAVENOUS | Status: DC | PRN
  Administered 2017-06-09: 0.5 mg via INTRAVENOUS
  Filled 2017-06-09: qty 1

## 2017-06-09 MED ORDER — AMIODARONE HCL IN DEXTROSE 360-4.14 MG/200ML-% IV SOLN
INTRAVENOUS | Status: AC
  Filled 2017-06-09: qty 200

## 2017-06-09 MED ORDER — DILTIAZEM HCL 60 MG PO TABS
60.0000 mg | ORAL_TABLET | Freq: Three times a day (TID) | ORAL | Status: DC
  Administered 2017-06-09: 60 mg via ORAL
  Filled 2017-06-09: qty 1

## 2017-06-09 MED ORDER — AMIODARONE IV BOLUS ONLY 150 MG/100ML
150.0000 mg | Freq: Once | INTRAVENOUS | Status: DC
  Filled 2017-06-09: qty 100

## 2017-06-09 MED ORDER — NITROGLYCERIN 0.4 MG SL SUBL
SUBLINGUAL_TABLET | SUBLINGUAL | Status: AC
  Filled 2017-06-09: qty 1

## 2017-06-09 MED ORDER — MAGNESIUM SULFATE 2 GM/50ML IV SOLN
2.0000 g | Freq: Once | INTRAVENOUS | Status: AC
Start: 2017-06-09 — End: 2017-06-09
  Administered 2017-06-09: 2 g via INTRAVENOUS
  Filled 2017-06-09: qty 50

## 2017-06-09 MED ORDER — METOPROLOL TARTRATE 5 MG/5ML IV SOLN
INTRAVENOUS | Status: AC
Start: 2017-06-09 — End: 2017-06-09
  Administered 2017-06-09: 09:00:00
  Filled 2017-06-09: qty 5

## 2017-06-09 MED ORDER — METOPROLOL TARTRATE 5 MG/5ML IV SOLN
INTRAVENOUS | Status: AC
  Administered 2017-06-09: 09:00:00
  Filled 2017-06-09: qty 5

## 2017-06-09 MED ORDER — METOPROLOL TARTRATE 25 MG PO TABS
50.0000 mg | ORAL_TABLET | Freq: Three times a day (TID) | ORAL | Status: DC
Start: 2017-06-09 — End: 2017-06-10
  Administered 2017-06-09 – 2017-06-10 (×3): 50 mg via ORAL
  Filled 2017-06-09 (×4): qty 2

## 2017-06-09 MED ORDER — NITROGLYCERIN 0.4 MG SL SUBL: 0.4000 mg | SUBLINGUAL_TABLET | SUBLINGUAL | Status: DC | PRN

## 2017-06-09 MED ORDER — LABETALOL HCL 200 MG PO TABS
200.0000 mg | ORAL_TABLET | Freq: Two times a day (BID) | ORAL | Status: DC
  Administered 2017-06-09: 200 mg via ORAL
  Filled 2017-06-09 (×2): qty 1

## 2017-06-09 MED ORDER — AMIODARONE IV BOLUS ONLY 150 MG/100ML
INTRAVENOUS | Status: AC
  Filled 2017-06-09: qty 100

## 2017-06-09 NOTE — Progress Notes (Signed)
Pt had 8 beat run of v tach. Lenon Curt, MD. MD ordered STAT BMET with Magnesium.  Magnesium 1.6 and replacement ordered.

## 2017-06-09 NOTE — Consult Note (Signed)
   [facility] CM Inpatient Consult   06/09/2017  [name] 07-Jun-1961 [id number]    Patient screened for potential [facility] Care Management/Link to Wellness needs on behalf of his [facility] insurance. [name] is currently in the stepdown unit. Will follow up at more appropriate time.    Marthenia Rolling, MSN-Ed, RN,BSN [facility] Liaison [phone]

## 2017-06-09 NOTE — Progress Notes (Signed)
Pt concerned about Prograf levels regarding administration of Cardizem PO. RN paged on-call for Cardiology and Dr. Doylene Canard ordered RN to discontinue Cardizem PO orders. RN notified MD that pt heart rhythm did appear to be A-flutter at this time, rate controlled 60s-70s. Will continue to monitor.

## 2017-06-09 NOTE — Consult Note (Signed)
Referring Physician: Hulan Fess, MD/Dr. Regalado/A. Lige Lakeman [name] is an 56 y.o. male.                       Chief Complaint: Atrial flutter in patient with renal transplant  HPI: 56 year old male with h/o atrial fibrillation, s/p ablation in 2013, CAD, s/p CABG, renal transplant on immunosuppressants had palpitations. EKG showed atrial flutter with RVR. Patient had hypotension with diltiazem bolus hence IV amiodarone was started with severe bradycardia and then switched over to metoprolol. Currently patient's heart rate is controlled and in 50-70 bpm. Patient is already on Eliquis 5 mg. bid for long time. Patient agrees to direct current cardioversion tomorrow if possible as he is not sure about repeat catheter ablation.  Past Medical History:  Diagnosis Date  . CAD (coronary artery disease)    a. s/p CABG 03/2012: LIMA to LAD, free RIMA to OM1, SVG to D1, sequential SVG to AM and LPLB2, EVH via right thigh and leg.  . Cellulitis 04/13/2012   RLE saphenous vein harvest incision   . Gout   . Hyperlipidemia   . Hyperparathyroidism   . Hypertension   . Hypertrophic cardiomyopathy ([place])   . Migraines   . PAF and Flutter    a. Afib post-op CABG; AFlutter 10/2012  . Peripheral vascular disease ([place])   . Sinus node dysfunction-post termination pause    a. >8sec in setting of aflutter 10/2012  . Stroke [facility]) 1995; 2001   denies residual  . Superficial vein thrombosis    a. R greater saphenous 04/2012      Past Surgical History:  Procedure Laterality Date  . ATRIAL FLUTTER ABLATION N/A 11/09/2012   Procedure: ATRIAL FLUTTER ABLATION;  Surgeon: [name], MD;  Location: [facility] CATH LAB;  Service: Cardiovascular;  Laterality: N/A;  . AV FISTULA PLACEMENT  2011   left forearm  . AV FISTULA PLACEMENT    . CARDIAC CATHETERIZATION  03/16/12  . CORONARY ARTERY BYPASS GRAFT  03/19/2012   Procedure: CORONARY ARTERY BYPASS GRAFTING (CABG);  Surgeon: Rexene Alberts, MD;  Location: [place];   Service: Open Heart Surgery;  Laterality: N/A;  (B) MAMMARY  . CYSTOSCOPY WITH BIOPSY N/A 08/15/2016   Procedure: CYSTOSCOPY WITH IDENTIFICATION OF RIGHT TRANSPLANTATION OF URETRAL ORFICE WITH FLUORESCEIN;  Surgeon: Carolan Clines, MD;  Location: [facility] ORS;  Service: Urology;  Laterality: N/A;  . DENTAL SURGERY  2008   multiple  . [place]   left  . FRACTURE SURGERY    . HERNIA REPAIR  41/2878   umbilical  . HOLMIUM LASER APPLICATION N/A 6/76/7209   Procedure: HOLMIUM LASER APPLICATION WITH 3 CM RIGHT BLADDER STONE;  Surgeon: Carolan Clines, MD;  Location: [facility] ORS;  Service: Urology;  Laterality: N/A;  . INGUINAL HERNIA REPAIR  09/2009   bilaterally  . KIDNEY TRANSPLANT    . LEFT HEART CATHETERIZATION WITH CORONARY ANGIOGRAM N/A 03/16/2012   Procedure: LEFT HEART CATHETERIZATION WITH CORONARY ANGIOGRAM;  Surgeon: Sherren Mocha, MD;  Location: [facility] CATH LAB;  Service: Cardiovascular;  Laterality: N/A;  . RENAL ARTERY STENT  2001  . TRANSURETHRAL RESECTION OF BLADDER TUMOR N/A 08/15/2016   Procedure: TRANSURETHRAL BIOPSY OF RIGHT BLADDER WALL;  Surgeon: Carolan Clines, MD;  Location: [facility] ORS;  Service: Urology;  Laterality: N/A;    Family History  Problem Relation Age of Onset  . Adopted: Yes  . Hypertension Other    Social History:  reports that he quit smoking about 5 years ago. His smoking use included Cigarettes. He has a 9.00 pack-year smoking history. He has never used smokeless tobacco. He reports that he drinks alcohol. He reports that he does not use drugs.  Allergies:  Allergies  Allergen Reactions  . Contrast Media [Iodinated Diagnostic Agents] Other (See Comments)    Pt states that this medication causes him to code.   Marland Kitchen Penicillins Itching, Rash and Other (See Comments)    Has patient had a PCN reaction causing immediate rash, facial/tongue/throat swelling, SOB or lightheadedness with hypotension: No Has patient had a PCN reaction causing severe  rash involving mucus membranes or skin necrosis: No Has patient had a PCN reaction that required hospitalization No Has patient had a PCN reaction occurring within the last 10 years: No If all of the above answers are "NO", then may proceed with Cephalosporin use.    Medications Prior to Admission  Medication Sig Dispense Refill  . allopurinol (ZYLOPRIM) 100 MG tablet Take 200 mg by mouth daily.     Marland Kitchen apixaban (ELIQUIS) 5 MG TABS tablet Take 1 tablet (5 mg total) by mouth 2 (two) times daily. 60 tablet 5  . aspirin 81 MG EC tablet Take 1 tablet (81 mg total) by mouth daily. (Patient taking differently: Take 81 mg by mouth at bedtime. ) 30 tablet   . cloNIDine (CATAPRES) 0.2 MG tablet Take 0.2 mg by mouth at bedtime.     Marland Kitchen diltiazem (CARDIZEM) 60 MG tablet Take 1 tablet (60 mg total) by mouth as needed (for palpitations). (Patient taking differently: Take 60 mg by mouth 4 (four) times daily as needed (for palpitations). ) 30 tablet 6  . fenofibrate micronized (LOFIBRA) 134 MG capsule Take 134 mg by mouth daily.     Marland Kitchen labetalol (NORMODYNE) 200 MG tablet Take 1 tablet (200 mg total) by mouth 2 (two) times daily. 180 tablet 1  . magnesium oxide (MAG-OX) 400 (241.3 Mg) MG tablet Take 400 mg by mouth 2 (two) times daily.    . mycophenolate (MYFORTIC) 180 MG EC tablet Take 720 mg by mouth 2 (two) times daily.     Marland Kitchen omega-3 acid ethyl esters (LOVAZA) 1 G capsule Take 2 g by mouth 2 (two) times daily.    Marland Kitchen omeprazole (PRILOSEC) 20 MG capsule Take 20 mg by mouth daily.  5  . potassium chloride SA (KLOR-CON M15) 15 MEQ tablet Take 30 mEq by mouth 2 (two) times daily.    . predniSONE (DELTASONE) 5 MG tablet Take 5 mg by mouth at bedtime.    . simvastatin (ZOCOR) 40 MG tablet Take 40 mg by mouth See admin instructions. Pt takes one tablet every day except Friday and Saturday.    . sulfamethoxazole-trimethoprim (BACTRIM,SEPTRA) 400-80 MG tablet Take 1 tablet by mouth every Monday, Wednesday, and Friday.  6   . tacrolimus (PROGRAF) 1 MG capsule Take 1 mg by mouth 2 (two) times daily.  5  . Vitamin D, Ergocalciferol, (DRISDOL) 50000 units CAPS capsule Take 50,000 Units by mouth 2 (two) times a week. Pt takes on Tuesday and Saturday.  5    Results for orders placed or performed during the hospital encounter of 06/08/17 (from the past 48 hour(s))  Basic metabolic panel     Status: Abnormal   Collection Time: 06/08/17 12:43 AM  Result Value Ref Range   Sodium 138 135 - 145 mmol/L   Potassium 4.0 3.5 - 5.1 mmol/L   Chloride 106 101 -  111 mmol/L   CO2 25 22 - 32 mmol/L   Glucose, Bld 140 (H) 65 - 99 mg/dL   BUN 20 6 - 20 mg/dL   Creatinine, Ser 1.60 (H) 0.61 - 1.24 mg/dL   Calcium 9.6 8.9 - 10.3 mg/dL   GFR calc non Af Amer 48 (L) >60 mL/min   GFR calc Af Amer 55 (L) >60 mL/min    Comment: (NOTE) The eGFR has been calculated using the CKD EPI equation. This calculation has not been validated in all clinical situations. eGFR's persistently <60 mL/min signify possible Chronic Kidney Disease.    Anion gap 7 5 - 15  CBC     Status: Abnormal   Collection Time: 06/08/17 12:43 AM  Result Value Ref Range   WBC 11.0 (H) 4.0 - 10.5 K/uL   RBC 6.60 (H) 4.22 - 5.81 MIL/uL   Hemoglobin 17.7 (H) 13.0 - 17.0 g/dL   HCT 54.3 (H) 39.0 - 52.0 %   MCV 82.3 78.0 - 100.0 fL   MCH 26.8 26.0 - 34.0 pg   MCHC 32.6 30.0 - 36.0 g/dL   RDW 15.3 11.5 - 15.5 %   Platelets 179 150 - 400 K/uL  POCT i-Stat troponin I     Status: None   Collection Time: 06/08/17 12:48 AM  Result Value Ref Range   Troponin i, poc 0.08 0.00 - 0.08 ng/mL   Comment 3            Comment: Due to the release kinetics of cTnI, a negative result within the first hours of the onset of symptoms does not rule out myocardial infarction with certainty. If myocardial infarction is still suspected, repeat the test at appropriate intervals.   Magnesium     Status: Abnormal   Collection Time: 06/08/17  5:26 AM  Result Value Ref Range    Magnesium 1.5 (L) 1.7 - 2.4 mg/dL  Troponin I (q 6hr x 3)     Status: Abnormal   Collection Time: 06/08/17  5:27 AM  Result Value Ref Range   Troponin I 0.17 (HH) <0.03 ng/mL    Comment: CRITICAL RESULT CALLED TO, READ BACK BY AND VERIFIED WITH: JESSEE,B RN [phone] [id number] [place],N CORRECT DATE OF CALL 06/08/2017   TSH     Status: None   Collection Time: 06/08/17  5:27 AM  Result Value Ref Range   TSH 0.697 0.350 - 4.500 uIU/mL    Comment: Performed by a 3rd Generation assay with a functional sensitivity of <=0.01 uIU/mL.  Comprehensive metabolic panel     Status: Abnormal   Collection Time: 06/08/17  5:27 AM  Result Value Ref Range   Sodium 139 135 - 145 mmol/L   Potassium 4.0 3.5 - 5.1 mmol/L   Chloride 106 101 - 111 mmol/L   CO2 24 22 - 32 mmol/L   Glucose, Bld 130 (H) 65 - 99 mg/dL   BUN 21 (H) 6 - 20 mg/dL   Creatinine, Ser 1.48 (H) 0.61 - 1.24 mg/dL   Calcium 9.4 8.9 - 10.3 mg/dL   Total Protein 6.0 (L) 6.5 - 8.1 g/dL   Albumin 3.6 3.5 - 5.0 g/dL   AST 29 15 - 41 U/L   ALT 38 17 - 63 U/L   Alkaline Phosphatase 37 (L) 38 - 126 U/L   Total Bilirubin 0.6 0.3 - 1.2 mg/dL   GFR calc non Af Amer 52 (L) >60 mL/min   GFR calc Af Amer >60 >60 mL/min    Comment: (  NOTE) The eGFR has been calculated using the CKD EPI equation. This calculation has not been validated in all clinical situations. eGFR's persistently <60 mL/min signify possible Chronic Kidney Disease.    Anion gap 9 5 - 15  CBC with Differential/Platelet     Status: Abnormal   Collection Time: 06/08/17  5:27 AM  Result Value Ref Range   WBC 8.8 4.0 - 10.5 K/uL   RBC 5.87 (H) 4.22 - 5.81 MIL/uL   Hemoglobin 15.8 13.0 - 17.0 g/dL   HCT 48.0 39.0 - 52.0 %   MCV 81.8 78.0 - 100.0 fL   MCH 26.9 26.0 - 34.0 pg   MCHC 32.9 30.0 - 36.0 g/dL   RDW 15.2 11.5 - 15.5 %   Platelets 148 (L) 150 - 400 K/uL   Neutrophils Relative % 75 %   Neutro Abs 6.7 1.7 - 7.7 K/uL   Lymphocytes Relative 11 %   Lymphs Abs 1.0 0.7 - 4.0  K/uL   Monocytes Relative 13 %   Monocytes Absolute 1.1 (H) 0.1 - 1.0 K/uL   Eosinophils Relative 1 %   Eosinophils Absolute 0.1 0.0 - 0.7 K/uL   Basophils Relative 0 %   Basophils Absolute 0.0 0.0 - 0.1 K/uL  MRSA PCR Screening     Status: None   Collection Time: 06/08/17  6:34 AM  Result Value Ref Range   MRSA by PCR NEGATIVE NEGATIVE    Comment:        The GeneXpert MRSA Assay (FDA approved for NASAL specimens only), is one component of a comprehensive MRSA colonization surveillance program. It is not intended to diagnose MRSA infection nor to guide or monitor treatment for MRSA infections.   Lactic acid, plasma     Status: Abnormal   Collection Time: 06/08/17  8:18 AM  Result Value Ref Range   Lactic Acid, Venous 2.4 (HH) 0.5 - 1.9 mmol/L    Comment: CRITICAL RESULT CALLED TO, READ BACK BY AND VERIFIED WITH: WELLBOURN,C AT 9:25AM ON 06/08/17 BY FESTERMAN,C   Troponin I (q 6hr x 3)     Status: Abnormal   Collection Time: 06/08/17 11:05 AM  Result Value Ref Range   Troponin I 0.42 (HH) <0.03 ng/mL    Comment: CRITICAL RESULT CALLED TO, READ BACK BY AND VERIFIED WITH: WELLBOURN,C AT 12:00PM ON 06/08/17 BY FESTERMAN,C   Lactic acid, plasma     Status: None   Collection Time: 06/08/17 11:05 AM  Result Value Ref Range   Lactic Acid, Venous 1.7 0.5 - 1.9 mmol/L  Lactic acid, plasma     Status: None   Collection Time: 06/08/17  3:10 PM  Result Value Ref Range   Lactic Acid, Venous 1.9 0.5 - 1.9 mmol/L  Troponin I (q 6hr x 3)     Status: Abnormal   Collection Time: 06/08/17  4:49 PM  Result Value Ref Range   Troponin I 0.48 (HH) <0.03 ng/mL    Comment: CRITICAL VALUE NOTED.  VALUE IS CONSISTENT WITH PREVIOUSLY REPORTED AND CALLED VALUE.  Urinalysis, Routine w reflex microscopic     Status: Abnormal   Collection Time: 06/08/17  8:49 PM  Result Value Ref Range   Color, Urine YELLOW YELLOW   APPearance CLEAR CLEAR   Specific Gravity, Urine 1.012 1.005 - 1.030   pH 6.0  5.0 - 8.0   Glucose, UA NEGATIVE NEGATIVE mg/dL   Hgb urine dipstick SMALL (A) NEGATIVE   Bilirubin Urine NEGATIVE NEGATIVE   Ketones, ur NEGATIVE NEGATIVE  mg/dL   Protein, ur NEGATIVE NEGATIVE mg/dL   Nitrite NEGATIVE NEGATIVE   Leukocytes, UA TRACE (A) NEGATIVE   RBC / HPF 0-5 0 - 5 RBC/hpf   WBC, UA 0-5 0 - 5 WBC/hpf   Bacteria, UA NONE SEEN NONE SEEN   Squamous Epithelial / LPF 0-5 (A) NONE SEEN  Basic metabolic panel     Status: Abnormal   Collection Time: 06/09/17  1:51 AM  Result Value Ref Range   Sodium 136 135 - 145 mmol/L   Potassium 3.6 3.5 - 5.1 mmol/L   Chloride 105 101 - 111 mmol/L   CO2 22 22 - 32 mmol/L   Glucose, Bld 126 (H) 65 - 99 mg/dL   BUN 22 (H) 6 - 20 mg/dL   Creatinine, Ser 0.35 (H) 0.61 - 1.24 mg/dL   Calcium 9.4 8.9 - 46.5 mg/dL   GFR calc non Af Amer 46 (L) >60 mL/min   GFR calc Af Amer 53 (L) >60 mL/min    Comment: (NOTE) The eGFR has been calculated using the CKD EPI equation. This calculation has not been validated in all clinical situations. eGFR's persistently <60 mL/min signify possible Chronic Kidney Disease.    Anion gap 9 5 - 15  Magnesium     Status: Abnormal   Collection Time: 06/09/17  1:51 AM  Result Value Ref Range   Magnesium 1.6 (L) 1.7 - 2.4 mg/dL   Dg Chest 2 View  Result Date: 06/08/2017 CLINICAL DATA:  Acute onset of generalized chest pain and tachycardia. Nausea and vomiting. Initial encounter. EXAM: CHEST  2 VIEW COMPARISON:  Chest radiograph performed 03/01/2017 FINDINGS: The lungs are well-aerated and clear. There is no evidence of focal opacification, pleural effusion or pneumothorax. The heart is borderline normal in size. The patient is status post median sternotomy, with evidence of prior CABG. No acute osseous abnormalities are seen. IMPRESSION: No acute cardiopulmonary process seen. Electronically Signed   By: Roanna Raider M.D.   On: 06/08/2017 00:37    Review Of Systems Constitutional: No fever, chills, weight loss  or gain. Eyes: No vision change, wears glasses. No discharge or pain. Ears: No hearing loss, No tinnitus. Respiratory: No asthma, COPD, pneumonias. Positive shortness of breath. No hemoptysis. Cardiovascular: Positive chest pain, palpitation, leg edema. Gastrointestinal: No nausea, vomiting, diarrhea, constipation. No GI bleed. No hepatitis. Genitourinary: No dysuria, hematuria, kidney stone. No incontinance. Neurological: No headache, stroke, seizures.  Psychiatry: No psych facility admission for anxiety, depression, suicide. No detox. Skin: No rash. Musculoskeletal: Positive joint pain, fibromyalgia. No neck pain, back pain. Lymphadenopathy: No lymphadenopathy. Hematology: No anemia or easy bruising.   Blood pressure 122/87, pulse 79, temperature 97.6 F (36.4 C), temperature source Oral, resp. rate (!) 22, height 5\' 8"  (1.727 m), weight 87.6 kg (193 lb 2 oz), SpO2 95 %. Body mass index is 29.36 kg/m. General appearance: alert, cooperative, appears stated age and no distress Head: Normocephalic, atraumatic. Eyes: Hazel eyes, pink conjunctiva, corneas clear. PERRL, EOM's intact. Neck: No adenopathy, no carotid bruit, no JVD, supple, symmetrical, trachea midline and thyroid not enlarged. Resp: Clear to auscultation bilaterally. Cardio: Irregular rate and rhythm, S1, S2 normal, II/VI systolic murmur, no click, rub or gallop GI: Soft, non-tender; bowel sounds normal; no organomegaly. Extremities: No edema, cyanosis or clubbing. Skin: Warm and dry.  Neurologic: Alert and oriented X 3, normal strength. Normal coordination and gait.  Assessment/Plan Atrial flutter with RVR- now controlled rate CAD S/P CABG CKD, III S/P renal transplant S/P Tacrolimus  and mycophenolate use Hypertensive heart disease Hyperlipidemia Long term use of anticoagulant Abnormal Troponin-I- minimal- demand ischemia  Discussed metoprolol use +/- diltiazem small dose or cardioversion while waiting for 2nd.  Ablation. Will keep patient NPO and schedule cardioversion.  Birdie Riddle, MD  06/09/2017, 8:15 PM

## 2017-06-09 NOTE — Progress Notes (Signed)
Progress Note  Patient Name: [name] Date of Encounter: 06/09/2017  Primary Cardiologist: Dr. Maryellen Pile  Subjective   Patient back into atrial flutter with RVR this AM around 6. Never converted to sinus but reports waking up with some mild chest pressure and diaphoresis, which are his typical symptoms with atrial flutter w/RVR. Per chart review-- Dr. Wynonia Lawman spoke with Dr. Curt Bears regarding a possible ablation but there is no room on schedule for today. Plan was to consider transfer to [facility] and D[facility] if unable to control rate. [facility] has tried two rounds of Lopressor this AM without any improvement. Hemodynamically stable.  Inpatient Medications    Scheduled Meds: . allopurinol  200 mg Oral Daily  . apixaban  5 mg Oral BID  . aspirin EC  81 mg Oral QHS  . cloNIDine  0.2 mg Oral QHS  . fenofibrate  160 mg Oral Daily  . labetalol  200 mg Oral BID  . magnesium oxide  400 mg Oral BID  . metoprolol tartrate      . metoprolol tartrate      . metoprolol tartrate  5 mg Intravenous Once  . mycophenolate  720 mg Oral BID  . nitroGLYCERIN      . omega-3 acid ethyl esters  2 g Oral BID  . pantoprazole  40 mg Oral Daily  . predniSONE  5 mg Oral QHS  . simvastatin  40 mg Oral Once per day on Sun Mon Tue Wed Thu  . sulfamethoxazole-trimethoprim  1 tablet Oral Q M,W,F  . tacrolimus  1 mg Oral BID  . [START ON 06/10/2017] Vitamin D (Ergocalciferol)  50,000 Units Oral Once per day on Tue Sat   Continuous Infusions: . amiodarone Stopped (06/08/17 1015)  . amiodarone    . diltiazem (CARDIZEM) infusion 5 mg/hr (06/09/17 0801)  . sodium chloride     PRN Meds: acetaminophen **OR** acetaminophen, morphine injection, nitroGLYCERIN   Vital Signs    Vitals:   06/09/17 0730 06/09/17 0745 06/09/17 0800 06/09/17 0815  BP: (!) 168/124 (!) 133/105 (!) 119/104 (!) 134/113  Pulse: (!) 146 72 (!) 146 (!) 146  Resp: 17 18 19 17   Temp:      TempSrc:      SpO2: 100% 100% 97% 99%    Weight:      Height:        Intake/Output Summary (Last 24 hours) at 06/09/17 0831 Last data filed at 06/08/17 2200  Gross per 24 hour  Intake            27.75 ml  Output              800 ml  Net          -772.25 ml   Filed Weights   06/08/17 0035 06/08/17 0600  Weight: 187 lb (84.8 kg) 193 lb 2 oz (87.6 kg)    Telemetry    Atrial aflutter, rates 140s  - Personally Reviewed   Physical Exam   GEN: Well nourished, well developed, diaphoretic HEENT: normal  Neck: no JVD, carotid bruits, or masses Cardiac: irreg irreg. no murmurs, rubs, or gallops,no edema. Intact distal pulses bilaterally.  Respiratory: clear to auscultation bilaterally, normal work of breathing GI: soft, nontender, nondistended, + BS MS: no deformity or atrophy  Skin: warm and dry, no rash Neuro: Alert and Oriented x 3, Strength and sensation are intact Psych:   Full affect  Labs    Chemistry Recent Labs Lab 06/08/17 [phone] 06/08/17  0527 06/09/17 0151  NA 138 139 136  K 4.0 4.0 3.6  CL 106 106 105  CO2 25 24 22   GLUCOSE 140* 130* 126*  BUN 20 21* 22*  CREATININE 1.60* 1.48* 1.65*  CALCIUM 9.6 9.4 9.4  PROT  --  6.0*  --   ALBUMIN  --  3.6  --   AST  --  29  --   ALT  --  38  --   ALKPHOS  --  37*  --   BILITOT  --  0.6  --   GFRNONAA 48* 52* 46*  GFRAA 55* >60 53*  ANIONGAP 7 9 9      Hematology Recent Labs Lab 06/08/17 0043 06/08/17 0527  WBC 11.0* 8.8  RBC 6.60* 5.87*  HGB 17.7* 15.8  HCT 54.3* 48.0  MCV 82.3 81.8  MCH 26.8 26.9  MCHC 32.6 32.9  RDW 15.3 15.2  PLT 179 148*    Cardiac Enzymes Recent Labs Lab 06/08/17 0527 06/08/17 1105 06/08/17 1649  TROPONINI 0.17* 0.42* 0.48*    Recent Labs Lab 06/08/17 0048  TROPIPOC 0.08     BNPNo results for input(s): BNP, PROBNP in the last 168 hours.   DDimer No results for input(s): DDIMER in the last 168 hours.   Radiology    Dg Chest 2 View  Result Date: 06/08/2017 CLINICAL DATA:  Acute onset of generalized  chest pain and tachycardia. Nausea and vomiting. Initial encounter. EXAM: CHEST  2 VIEW COMPARISON:  Chest radiograph performed 03/01/2017 FINDINGS: The lungs are well-aerated and clear. There is no evidence of focal opacification, pleural effusion or pneumothorax. The heart is borderline normal in size. The patient is status post median sternotomy, with evidence of prior CABG. No acute osseous abnormalities are seen. IMPRESSION: No acute cardiopulmonary process seen. Electronically Signed   By: Garald Balding M.D.   On: 06/08/2017 00:37    Cardiac Studies   Nuc Stress Test- 03/26/2017  T wave inversion was noted during stress in the V5 and V6 leads. T wave inversion unchanged between rest and stress.   1.  Study not gated.  2.  Reversible small, mild basal to mid inferior perfusion defect.  This suggests an area of ischemia.  Given size of defect, would rate it low risk overall.   Holter Monitor 48hr- 07/30/2016 NSR Average HR 71 bpm PVC;s NSVT less than 9 beats total ventricular ectopic beats less than 2% PAC;s total 25% of total beats  No symptoms   Echocardiogram- 03/02/2017 - Left ventricle: Inferobasal hypokinesis The cavity size was   normal. Wall thickness was increased in a pattern of mild LVH.   Systolic function was normal. The estimated ejection fraction was   in the range of 55% to 60%. - Aortic valve: There was mild regurgitation. - Mitral valve: There was mild regurgitation. - Atrial septum: No defect or patent foramen ovale was identified. - Impressions: Abnormal GLS -11.  Impressions:  - Abnormal GLS -11.   Patient Profile     This very nice 56 year old male has a history of chronic kidney disease with renal transplant.  He had bypass grafting in 2013 and had a recent stress test that showed only a small focus of inferior ischemia  in April.  He had an episode of rapid atrial flutter that resolved in March of this year and had recently had sepsis prior to that  that he thought precipitated it.  Following that he had a negative stress test and an echo done  earlier this year showed mild LVH.  Arrived to hospital 06/08/2017 with Atrial Flutter with RVR, He is anticoagulated and complaint.  Assessment & Plan     1. Reccurent Atrial flutter with RVR: Back in aflutter with RVR this AM. Has been given two doses of Lopressor which failed.  IV Diltiazem drip ordered-- starting with 5mg , no bolus given because he became hypotensive with a 10 mg bolus previously. Will rebolus Amiodarone 150 mg and give 250 mg bolus of normal saline to help his pressure which is currently 119/104. -- Will titrate dilt drip up aggressively as BP tolerates -- Dr. Wynonia Lawman documented speaking with Dr. Curt Bears about eventually doing a cardio ablation inpatient vs outpatient. For now, if we cannot rate control pt inpatient the plan is to possibly transfer to [facility] for D[facility]V. Will continue to monitor patients progress. -- CHADVASC 4 ( HTN/1, CVA/2, vasc dz/ 1 ) on Aspirin and Eliquis  2. CAD with previous bypass grafting 5 years ago: Recent low risk stress test. He is having chest pressure and diaphoresis which he describes as his typical symptoms when he has aflutter with RVR.  3. CKD: hx of renal transplant, looks dry will give small 250 mg fluid bolus.  4. Hypertension: Currently stable  5. Hyperlipidemia: Cont statin.    Kristopher Glee, PA-C  06/09/2017, 8:31 AM

## 2017-06-09 NOTE — Progress Notes (Addendum)
PROGRESS NOTE    [name]  HAL:[id number] DOB: 05/22/1961 DOA: 06/08/2017 PCP: Hulan Fess, MD   Brief Narrative: [name] is a 56 y.o. male with history of atrial fibrillation, CAD status post CABG, renal transplant on immunosuppressants presents to the ER with complaints of palpitations which started last night around 9 PM. Patient states he has been having some upper respiratory tract symptoms over the last few days. Denies any productive cough or fever or chills. Along with the palpitations patient had chest discomfort. Denies any shortness of breath dizziness or loss of consciousness.   ED Course: In the ER patient was initially found to be in atrial flutter with RVR and was given Cardizem bolus following which patient became A. fib with RVR with hypotension. ER physician discussed with on-call cardiologist who advised patient to be started on amiodarone infusion. Patient is being admitted for further management of A. fib with RVR.   Assessment & Plan:   Principal Problem:   Atrial flutter (HCC) Active Problems:   S/P CABG (coronary artery bypass graft)   History of renal transplant   CAD (coronary artery disease)  1-A fib; RVR; became hypotensive with Cardizem. Was  on amiodarone Gtt.  He is already on Eliquis.  Cardiology consulted.  His labetalol and Cardizem was on hold initially due to  hypotension.  Amiodarone on hold now because HR was in the 50--40.  He received labetalol at 4 Am.  He is now in A fib, HR in the 140, he is having dyspnea and chest heaviness.  IV metoprolol given. Discussed with cardiology, repeat Dose, and cardiology will see patient.  Very low dose morphine PRN for pain.  Patient remain in the 140, still with dyspnea. Will start Cardizem gt.   2-Renal transplant with chronic kidney disease stage III - cr baseline 1.5--.16 Hold stress dose of steroids, patient report tachycardia from steroids. Monitor BP. No others symptoms of adrenal  insufficiency.  Continue with prednisone, Prograf, and antibiotics prophylaxis Bactrim.  Cr at 1.4  CAD status post CABG , Mildly elevated troponin;  In setting of A fib.  Cardiology consulted.  Continue with aspirin and statins.   History of gout on allopurinol.  HTN; resume labetalol and clonidine  Hypomagnesemia;  Received IV magnesium.   DVT prophylaxis: Eliquis Code Status: Full code.  Family Communication: Care discussed with patient and wife who was at bedside.  Disposition Plan: remain inpatient for evaluation , treatment of A fib.    Consultants:   Cardiology    Procedures:  none  Antimicrobials:  none  Subjective: He is having dyspnea and chest heaviness, his HR I 140.  He is alert.   Objective: Vitals:   06/09/17 0200 06/09/17 0330 06/09/17 0345 06/09/17 0400  BP: (!) 139/91 (!) 155/101 (!) 147/104 (!) 157/103  Pulse: 77 76 77 75  Resp: 19 18 (!) 21 (!) 23  Temp:      TempSrc:      SpO2: 97% 98% 97% 98%  Weight:      Height:        Intake/Output Summary (Last 24 hours) at 06/09/17 0733 Last data filed at 06/08/17 2200  Gross per 24 hour  Intake            27.75 ml  Output              800 ml  Net          -772.25 ml   [employer]  06/08/17 0035 06/08/17 0600  Weight: 84.8 kg (187 lb) 87.6 kg (193 lb 2 oz)    Examination:  General exam: No distress.  Respiratory system: Clear to auscultation. Respiratory effort normal. Cardiovascular system: S1 & S2 heard, IRR, tachycardic. No JVD, murmurs, rubs, gallops or clicks. No pedal edema. Gastrointestinal system: Abdomen is nondistended, soft and nontender. No organomegaly or masses felt. Normal bowel sounds heard. Central nervous system: Alert and oriented. No focal neurological deficits. Extremities: Symmetric 5 x 5 power. Skin: No rashes, lesions or ulcers     Data Reviewed: I have personally reviewed following labs and imaging studies  CBC:  Recent Labs Lab 06/08/17 0043  06/08/17 0527  WBC 11.0* 8.8  NEUTROABS  --  6.7  HGB 17.7* 15.8  HCT 54.3* 48.0  MCV 82.3 81.8  PLT 179 427*   Basic Metabolic Panel:  Recent Labs Lab 06/08/17 0043 06/08/17 0526 06/08/17 0527 06/09/17 0151  NA 138  --  139 136  K 4.0  --  4.0 3.6  CL 106  --  106 105  CO2 25  --  24 22  GLUCOSE 140*  --  130* 126*  BUN 20  --  21* 22*  CREATININE 1.60*  --  1.48* 1.65*  CALCIUM 9.6  --  9.4 9.4  MG  --  1.5*  --  1.6*   GFR: Estimated Creatinine Clearance: 55.7 mL/min (A) (by C-G formula based on SCr of 1.65 mg/dL (H)). Liver Function Tests:  Recent Labs Lab 06/08/17 0527  AST 29  ALT 38  ALKPHOS 37*  BILITOT 0.6  PROT 6.0*  ALBUMIN 3.6   No results for input(s): LIPASE, AMYLASE in the last 168 hours. No results for input(s): AMMONIA in the last 168 hours. Coagulation Profile: No results for input(s): INR, PROTIME in the last 168 hours. Cardiac Enzymes:  Recent Labs Lab 06/08/17 0527 06/08/17 1105 06/08/17 1649  TROPONINI 0.17* 0.42* 0.48*   BNP (last 3 results) No results for input(s): PROBNP in the last 8760 hours. HbA1C: No results for input(s): HGBA1C in the last 72 hours. CBG: No results for input(s): GLUCAP in the last 168 hours. Lipid Profile: No results for input(s): CHOL, HDL, LDLCALC, TRIG, CHOLHDL, LDLDIRECT in the last 72 hours. Thyroid Function Tests:  Recent Labs  06/08/17 0527  TSH 0.697   Anemia Panel: No results for input(s): VITAMINB12, FOLATE, FERRITIN, TIBC, IRON, RETICCTPCT in the last 72 hours. Sepsis Labs:  Recent Labs Lab 06/08/17 0818 06/08/17 1105 06/08/17 1510  LATICACIDVEN 2.4* 1.7 1.9    Recent Results (from the past 240 hour(s))  MRSA PCR Screening     Status: None   Collection Time: 06/08/17  6:34 AM  Result Value Ref Range Status   MRSA by PCR NEGATIVE NEGATIVE Final    Comment:        The GeneXpert MRSA Assay (FDA approved for NASAL specimens only), is one component of a comprehensive MRSA  colonization surveillance program. It is not intended to diagnose MRSA infection nor to guide or monitor treatment for MRSA infections.          Radiology Studies: Dg Chest 2 View  Result Date: 06/08/2017 CLINICAL DATA:  Acute onset of generalized chest pain and tachycardia. Nausea and vomiting. Initial encounter. EXAM: CHEST  2 VIEW COMPARISON:  Chest radiograph performed 03/01/2017 FINDINGS: The lungs are well-aerated and clear. There is no evidence of focal opacification, pleural effusion or pneumothorax. The heart is borderline normal in size. The patient  is status post median sternotomy, with evidence of prior CABG. No acute osseous abnormalities are seen. IMPRESSION: No acute cardiopulmonary process seen. Electronically Signed   By: Garald Balding M.D.   On: 06/08/2017 00:37        Scheduled Meds: . metoprolol tartrate      . allopurinol  200 mg Oral Daily  . apixaban  5 mg Oral BID  . aspirin EC  81 mg Oral QHS  . cloNIDine  0.2 mg Oral QHS  . fenofibrate  160 mg Oral Daily  . labetalol  200 mg Oral BID  . magnesium oxide  400 mg Oral BID  . metoprolol tartrate  5 mg Intravenous Once  . mycophenolate  720 mg Oral BID  . nitroGLYCERIN      . omega-3 acid ethyl esters  2 g Oral BID  . pantoprazole  40 mg Oral Daily  . predniSONE  5 mg Oral QHS  . simvastatin  40 mg Oral Once per day on Sun Mon Tue Wed Thu  . sulfamethoxazole-trimethoprim  1 tablet Oral Q M,W,F  . tacrolimus  1 mg Oral BID  . [START ON 06/10/2017] Vitamin D (Ergocalciferol)  50,000 Units Oral Once per day on Tue Sat   Continuous Infusions: . amiodarone    . amiodarone Stopped (06/08/17 1015)     LOS: 0 days    Time spent: 35 minutes.     Elmarie Shiley, MD [facility] Pager [phone]  If 7PM-7AM, please contact night-coverage [email or web address] Password [facility] 06/09/2017, 7:33 AM

## 2017-06-09 NOTE — Progress Notes (Signed)
Notified Cardiology of pt condition as per Dr. Tyrell Antonio.  Pending orders for treatment.

## 2017-06-09 NOTE — Progress Notes (Signed)
Pt requested that restricted arm braclet be removed so that lab can draw from his left hand and IV can be placed if needed.  He has not needed to use the fistula for a long time b/c of his kidney transplant.  Restricted arm band removed Per pt request

## 2017-06-09 NOTE — Progress Notes (Signed)
Pt had 8 beat run V tach per telemetry at 0135.  Notified Griffin Basil, MD.  Placed order for BMET with Mag STAT verbal order w readback.

## 2017-06-09 NOTE — Progress Notes (Signed)
Pt reported shortness of breath and chest heaviness/chest pain.  Notified MD Regalado. Obtained STAT 12 lead EKG and awaiting orders from MD.

## 2017-06-10 ENCOUNTER — Encounter (HOSPITAL_COMMUNITY)
Admission: EM | Disposition: A | Discharge status: Home / Self Care | Payer: Self-pay | Source: Home / Self Care | Attending: Internal Medicine

## 2017-06-10 ENCOUNTER — Inpatient Hospital Stay (HOSPITAL_COMMUNITY): Payer: 59 | Admitting: Certified Registered Nurse Anesthetist

## 2017-06-10 ENCOUNTER — Encounter (HOSPITAL_COMMUNITY): Payer: Self-pay

## 2017-06-10 DIAGNOSIS — I483 Typical atrial flutter: Secondary | ICD-10-CM

## 2017-06-10 DIAGNOSIS — I10 Essential (primary) hypertension: Secondary | ICD-10-CM

## 2017-06-10 HISTORY — PX: CARDIOVERSION: SHX1299

## 2017-06-10 LAB — BASIC METABOLIC PANEL
ANION GAP: 9 (ref 5–15)
BUN: 21 mg/dL — ABNORMAL HIGH (ref 6–20)
CALCIUM: 9.6 mg/dL (ref 8.9–10.3)
CO2: 24 mmol/L (ref 22–32)
CREATININE: 1.47 mg/dL — AB (ref 0.61–1.24)
Chloride: 106 mmol/L (ref 101–111)
GFR, EST NON AFRICAN AMERICAN: 53 mL/min — AB (ref >60)
Glucose, Bld: 124 mg/dL — ABNORMAL HIGH (ref 65–99)
Potassium: 4.4 mmol/L (ref 3.5–5.1)
SODIUM: 139 mmol/L (ref 135–145)

## 2017-06-10 LAB — MAGNESIUM: MAGNESIUM: 1.6 mg/dL — AB (ref 1.7–2.4)

## 2017-06-10 SURGERY — CARDIOVERSION
Anesthesia: General

## 2017-06-10 MED ORDER — SODIUM CHLORIDE 0.9% FLUSH
3.0000 mL | Freq: Two times a day (BID) | INTRAVENOUS | Status: DC
  Administered 2017-06-10 – 2017-06-11 (×2): 3 mL via INTRAVENOUS

## 2017-06-10 MED ORDER — APIXABAN 2.5 MG PO TABS
ORAL_TABLET | ORAL | Status: DC | PRN
Start: 2017-06-10 — End: 2017-06-10
  Administered 2017-06-10: 5 mg via ORAL

## 2017-06-10 MED ORDER — METOPROLOL TARTRATE 25 MG PO TABS
50.0000 mg | ORAL_TABLET | Freq: Two times a day (BID) | ORAL | Status: DC
  Administered 2017-06-10 – 2017-06-11 (×2): 50 mg via ORAL
  Filled 2017-06-10 (×2): qty 2

## 2017-06-10 MED ORDER — ALUM & MAG HYDROXIDE-SIMETH 200-200-20 MG/5ML PO SUSP
30.0000 mL | ORAL | Status: DC | PRN
  Administered 2017-06-11: 30 mL via ORAL
  Filled 2017-06-10: qty 30

## 2017-06-10 MED ORDER — APIXABAN 5 MG PO TABS
ORAL_TABLET | ORAL | Status: AC
  Filled 2017-06-10: qty 1

## 2017-06-10 MED ORDER — PROPOFOL 10 MG/ML IV BOLUS
INTRAVENOUS | Status: DC | PRN
  Administered 2017-06-10: 40 mg via INTRAVENOUS
  Administered 2017-06-10: 20 mg via INTRAVENOUS
  Administered 2017-06-10: 40 mg via INTRAVENOUS

## 2017-06-10 MED ORDER — SODIUM CHLORIDE 0.9 % IV SOLN
250.0000 mL | INTRAVENOUS | Status: DC
  Administered 2017-06-10: 11:00:00 via INTRAVENOUS

## 2017-06-10 MED ORDER — SODIUM CHLORIDE 0.9% FLUSH: 3.0000 mL | INTRAVENOUS | Status: DC | PRN

## 2017-06-10 MED ORDER — MAGNESIUM SULFATE 2 GM/50ML IV SOLN
2.0000 g | Freq: Once | INTRAVENOUS | Status: AC
  Administered 2017-06-10: 2 g via INTRAVENOUS
  Filled 2017-06-10: qty 50

## 2017-06-10 NOTE — Progress Notes (Signed)
Pt had 14 beat run of VTach, followed shortly by a 5-beat run of Vtach. Cardio MD paged. Orders placed for magnesium. Will continue to monitor.

## 2017-06-10 NOTE — Transfer of Care (Addendum)
Immediate Anesthesia Transfer of Care Note  Patient: [name]  Procedure(s) Performed: Procedure(s): CARDIOVERSION (N/A)  Patient Location: Endoscopy Unit  Anesthesia Type:General  Level of Consciousness: drowsy and patient cooperative  Airway & Oxygen Therapy: Patient Spontanous Breathing and Patient connected to nasal cannula oxygen  Post-op Assessment: Report given to RN and Post -op Vital signs reviewed and stable  Post vital signs: Reviewed and stable  Last Vitals:  Vitals:   06/10/17 1047 06/10/17 1048  BP: 135/85   Pulse: (!) 55 (!) 27  Resp: (!) 21 17  Temp:      Last Pain:  Vitals:   06/10/17 0953  TempSrc: Oral  PainSc:       Patients Stated Pain Goal: 3 (61/53/79 4327)  Complications: No apparent anesthesia complications

## 2017-06-10 NOTE — Progress Notes (Signed)
Please note Dr. Doylene Canard saw pt because someone thought he was on call for all cardiology.  Dr. Burt Knack has seen today and plan for DCCV this PM, will plan for cardioversion this pm at 1400, care link will transfer pt around noon.   Pt agreeable.

## 2017-06-10 NOTE — CV Procedure (Signed)
   CARDIOVERSION NOTE  Procedure: Electrical Cardioversion Indications:  Atrial Flutter  Procedure Details:  Consent: Risks of procedure as well as the alternatives and risks of each were explained to the (patient/caregiver).  Consent for procedure obtained.  Time Out: Verified patient identification, verified procedure, site/side was marked, verified correct patient position, special equipment/implants available, medications/allergies/relevent history reviewed, required imaging and test results available.  Performed  Patient placed on cardiac monitor, pulse oximetry, supplemental oxygen as necessary.  Sedation given: Propofol per anesthesia Pacer pads placed anterior and posterior chest.  Cardioverted 1 time(s).  Cardioverted at 150J biphasic.  Impression: Findings: Post procedure EKG shows: NSR with PAC's and PVC's Complications: None Patient did tolerate procedure well.  Plan: 1. Successful DCCV to NSR with a single 150J biphasic shock. PAC's and PVC's with atrial bigeminy was noted. 2. Patient will be returned to [facility] but will likely to be able to be discharged later today from a cardiology standpoint.  Time Spent Directly with the Patient:  30 minutes   Steven Casino, MD, [name]  Attending Cardiologist  Direct Dial: [phone]  Fax: [phone]  Website:  [name] 06/10/2017, 10:54 AM

## 2017-06-10 NOTE — Anesthesia Procedure Notes (Signed)
Procedure Name: MAC Date/Time: 06/10/2017 10:30 AM Performed by: Mervyn Gay Pre-anesthesia Checklist: Patient identified, Patient being monitored, Timeout performed, Emergency Drugs available and Suction available Patient Re-evaluated:Patient Re-evaluated prior to inductionOxygen Delivery Method: Nasal cannula Number of attempts: 1 Placement Confirmation: positive ETCO2 Dental Injury: Teeth and Oropharynx as per pre-operative assessment

## 2017-06-10 NOTE — Progress Notes (Signed)
PROGRESS NOTE    [name]  NTZ:[id number] DOB: [birth year] DOA: 06/08/2017 PCP: Hulan Fess, MD   Brief Narrative: [name] is a 56 y.o. male with history of atrial fibrillation, CAD status post CABG, renal transplant on immunosuppressants presents to the ER with complaints of palpitations which started last night around 9 PM. Patient states he has been having some upper respiratory tract symptoms over the last few days. Denies any productive cough or fever or chills. Along with the palpitations patient had chest discomfort. Denies any shortness of breath dizziness or loss of consciousness.   ED Course: In the ER patient was initially found to be in atrial flutter with RVR and was given Cardizem bolus following which patient became A. fib with RVR with hypotension. ER physician discussed with on-call cardiologist who advised patient to be started on amiodarone infusion. Patient is being admitted for further management of A. fib with RVR.   Assessment & Plan:   Principal Problem:   Atrial flutter (HCC) Active Problems:   S/P CABG (coronary artery bypass graft)   History of renal transplant   CAD (coronary artery disease)   Pure hypercholesterolemia   Demand ischemia (HCC)   Atrial fibrillation with RVR (HCC)  1-A fib; RVR; became hypotensive with Cardizem. Was  on amiodarone Gtt.  He is already on Eliquis.  Cardiology consulted.  His labetalol and Cardizem was on hold initially due to  hypotension.  Amiodarone on hold now because HR was in the 50--40.  HR better controlled today. Yesterday he responded to IV Cardizem Gtt.  Cardiology planning cardioversion at [facility]. Plan per cardio.   2-Renal transplant with chronic kidney disease stage III - cr baseline 1.5--.16 Hold stress dose of steroids, patient report tachycardia from steroids. Monitor BP. No others symptoms of adrenal insufficiency.  Continue with prednisone, Prograf, and antibiotics prophylaxis Bactrim.  Cr  at 1.4  CAD status post CABG , Mildly elevated troponin;  In setting of A fib.  Cardiology consulted.  Continue with aspirin and statins.   History of gout on allopurinol.  HTN; resume labetalol and clonidine  Hypomagnesemia;  Received IV magnesium.  Repeat mg level.   DVT prophylaxis: Eliquis Code Status: Full code.  Family Communication: Care discussed with patient and wife who was at bedside.  Disposition Plan: plan per cardio   Consultants:   Cardiology    Procedures:  none  Antimicrobials:  none  Subjective: Feeling better this morning. No chest pain or dyspnea.   Objective: Vitals:   06/10/17 0500 06/10/17 0600 06/10/17 0607 06/10/17 0800  BP:  (!) 141/96 (!) 141/96 (!) 133/103  Pulse: 77 78 80 81  Resp: 17 17  19   Temp:    98.3 F (36.8 C)  TempSrc:    Oral  SpO2: 96% 96%  98%  Weight:      Height:        Intake/Output Summary (Last 24 hours) at 06/10/17 0903 Last data filed at 06/10/17 0800  Gross per 24 hour  Intake             1260 ml  Output             2150 ml  Net             -890 ml   [employer]   06/08/17 0035 06/08/17 0600 06/10/17 0100  Weight: 84.8 kg (187 lb) 87.6 kg (193 lb 2 oz) 87.4 kg (192 lb 10.9 oz)    Examination:  General exam: No distress.  Respiratory system: CTA Cardiovascular system: S 1, S 2 IRR Gastrointestinal system: BS present, soft. nt Central nervous system: non focal.  Extremities: symmetric power.  Skin: No rashes, lesions or ulcers     Data Reviewed: I have personally reviewed following labs and imaging studies  CBC:  Recent Labs Lab 06/08/17 0043 06/08/17 0527  WBC 11.0* 8.8  NEUTROABS  --  6.7  HGB 17.7* 15.8  HCT 54.3* 48.0  MCV 82.3 81.8  PLT 179 782*   Basic Metabolic Panel:  Recent Labs Lab 06/08/17 0043 06/08/17 0526 06/08/17 0527 06/09/17 0151  NA 138  --  139 136  K 4.0  --  4.0 3.6  CL 106  --  106 105  CO2 25  --  24 22  GLUCOSE 140*  --  130* 126*  BUN 20  --   21* 22*  CREATININE 1.60*  --  1.48* 1.65*  CALCIUM 9.6  --  9.4 9.4  MG  --  1.5*  --  1.6*   GFR: Estimated Creatinine Clearance: 55.7 mL/min (A) (by C-G formula based on SCr of 1.65 mg/dL (H)). Liver Function Tests:  Recent Labs Lab 06/08/17 0527  AST 29  ALT 38  ALKPHOS 37*  BILITOT 0.6  PROT 6.0*  ALBUMIN 3.6   No results for input(s): LIPASE, AMYLASE in the last 168 hours. No results for input(s): AMMONIA in the last 168 hours. Coagulation Profile: No results for input(s): INR, PROTIME in the last 168 hours. Cardiac Enzymes:  Recent Labs Lab 06/08/17 0527 06/08/17 1105 06/08/17 1649  TROPONINI 0.17* 0.42* 0.48*   BNP (last 3 results) No results for input(s): PROBNP in the last 8760 hours. HbA1C: No results for input(s): HGBA1C in the last 72 hours. CBG: No results for input(s): GLUCAP in the last 168 hours. Lipid Profile: No results for input(s): CHOL, HDL, LDLCALC, TRIG, CHOLHDL, LDLDIRECT in the last 72 hours. Thyroid Function Tests:  Recent Labs  06/08/17 0527  TSH 0.697   Anemia Panel: No results for input(s): VITAMINB12, FOLATE, FERRITIN, TIBC, IRON, RETICCTPCT in the last 72 hours. Sepsis Labs:  Recent Labs Lab 06/08/17 0818 06/08/17 1105 06/08/17 1510  LATICACIDVEN 2.4* 1.7 1.9    Recent Results (from the past 240 hour(s))  MRSA PCR Screening     Status: None   Collection Time: 06/08/17  6:34 AM  Result Value Ref Range Status   MRSA by PCR NEGATIVE NEGATIVE Final    Comment:        The GeneXpert MRSA Assay (FDA approved for NASAL specimens only), is one component of a comprehensive MRSA colonization surveillance program. It is not intended to diagnose MRSA infection nor to guide or monitor treatment for MRSA infections.          Radiology Studies: No results found.      Scheduled Meds: . allopurinol  200 mg Oral Daily  . apixaban  5 mg Oral BID  . aspirin EC  81 mg Oral QHS  . cloNIDine  0.2 mg Oral QHS  .  fenofibrate  160 mg Oral Daily  . magnesium oxide  400 mg Oral BID  . metoprolol tartrate  50 mg Oral Q8H  . mycophenolate  720 mg Oral BID  . omega-3 acid ethyl esters  2 g Oral BID  . pantoprazole  40 mg Oral Daily  . predniSONE  5 mg Oral QHS  . simvastatin  40 mg Oral Once per day on Sun Mon  Tue Wed Thu  . sodium chloride flush  3 mL Intravenous Q12H  . sulfamethoxazole-trimethoprim  1 tablet Oral Q M,W,F  . tacrolimus  1 mg Oral BID  . Vitamin D (Ergocalciferol)  50,000 Units Oral Once per day on Tue Sat   Continuous Infusions: . sodium chloride       LOS: 1 day    Time spent: 35 minutes.     Elmarie Shiley, MD [facility] Pager [phone]  If 7PM-7AM, please contact night-coverage [email or web address] Password [email or web address] 06/10/2017, 9:03 AM

## 2017-06-10 NOTE — Anesthesia Preprocedure Evaluation (Addendum)
Anesthesia Evaluation  Patient identified by MRN, date of birth, ID band Patient awake    Reviewed: Allergy & Precautions, NPO status , Patient's Chart, lab work & pertinent test results  History of Anesthesia Complications Negative for: history of anesthetic complications  Airway Mallampati: II  TM Distance: >3 FB Neck ROM: Full    Dental  (+) Edentulous Upper, Edentulous Lower   Pulmonary former smoker,    breath sounds clear to auscultation       Cardiovascular hypertension, Pt. on medications and Pt. on home beta blockers + CAD and + Peripheral Vascular Disease   Rhythm:Irregular     Neuro/Psych  Headaches, CVA    GI/Hepatic Neg liver ROS, GERD  Medicated and Controlled,  Endo/Other    Renal/GU CRFRenal disease     Musculoskeletal   Abdominal   Peds  Hematology   Anesthesia Other Findings   Reproductive/Obstetrics                           Anesthesia Physical Anesthesia Plan  ASA: III  Anesthesia Plan: General   Post-op Pain Management:    Induction:   PONV Risk Score and Plan: 2 and Treatment may vary due to age or medical condition  Airway Management Planned: Mask  Additional Equipment: None  Intra-op Plan:   Post-operative Plan:   Informed Consent: I have reviewed the patients History and Physical, chart, labs and discussed the procedure including the risks, benefits and alternatives for the proposed anesthesia with the patient or authorized representative who has indicated his/her understanding and acceptance.   Dental advisory given  Plan Discussed with: CRNA and Surgeon  Anesthesia Plan Comments:         Anesthesia Quick Evaluation

## 2017-06-10 NOTE — Progress Notes (Addendum)
Pt was successfully cardioverted and on arrival back to [facility] he had 14 beats of VT.  Then another short run, notified by RN.   Mg + is 1.6 will give 2 gm IV magnesium and he was started back on lopressor.    ? Discharge today vs. Tomorrow.   Also pt had been on labetalol for BB.  Now on metorpolol every 8 hours.  Continue for now though may need reduced dose if BP soft.  He has appt with Dr. Caryl Comes 06/26/17.  I will discuss with Dr. Burt Knack.     Please note he was given AM dose of Eliquis at [facility]. Per Dr. Debara Pickett.   NO DISCHARGE today due to VT - monitor overnight and plan for discharge tomorrow.  Per Dr. Burt Knack.

## 2017-06-10 NOTE — Progress Notes (Signed)
Orders placed for pt to transfer to [facility] for cardioversion. Informed consent signed and sent with pt. Report called to Endoscopy. Care link has picked up the patient and are now in route to [facility].

## 2017-06-10 NOTE — H&P (Signed)
   INTERVAL PROCEDURE H&P  History and Physical Interval Note:  06/10/2017 10:23 AM  Anette Riedel Thom has presented today for their planned procedure. The various methods of treatment have been discussed with the patient and family. After consideration of risks, benefits and other options for treatment, the patient has consented to the procedure.  The patients' outpatient history has been reviewed, patient examined, and no change in status from most recent office note within the past 30 days. I have reviewed the patients' chart and labs and will proceed as planned. Questions were answered to the patient's satisfaction.   Pixie Casino, MD, [place]  Attending Cardiologist  Direct Dial: [phone]  Fax: [phone]  Website:  [email or web address] 06/10/2017, 10:23 AM

## 2017-06-10 NOTE — Progress Notes (Signed)
Progress Note  Patient Name: MANG HAZELRIGG Date of Encounter: 06/10/2017  Primary Cardiologist: Nishan/Klein  Subjective   The patient feels better this morning. At rest he denies any chest pain or shortness of breath. He is nothing by mouth for possible cardioversion today.  Inpatient Medications    Scheduled Meds: . allopurinol  200 mg Oral Daily  . apixaban  5 mg Oral BID  . aspirin EC  81 mg Oral QHS  . cloNIDine  0.2 mg Oral QHS  . fenofibrate  160 mg Oral Daily  . magnesium oxide  400 mg Oral BID  . metoprolol tartrate  50 mg Oral Q8H  . mycophenolate  720 mg Oral BID  . omega-3 acid ethyl esters  2 g Oral BID  . pantoprazole  40 mg Oral Daily  . predniSONE  5 mg Oral QHS  . simvastatin  40 mg Oral Once per day on Sun Mon Tue Wed Thu  . sulfamethoxazole-trimethoprim  1 tablet Oral Q M,W,F  . tacrolimus  1 mg Oral BID  . Vitamin D (Ergocalciferol)  50,000 Units Oral Once per day on Tue Sat   Continuous Infusions:  PRN Meds: acetaminophen **OR** acetaminophen, ALPRAZolam, morphine injection, nitroGLYCERIN   Vital Signs    Vitals:   06/10/17 0400 06/10/17 0500 06/10/17 0600 06/10/17 0607  BP: 119/81  (!) 141/96 (!) 141/96  Pulse: 78 77 78 80  Resp: 17 17 17    Temp: 98.8 F (37.1 C)     TempSrc: Oral     SpO2: 97% 96% 96%   Weight:      Height:        Intake/Output Summary (Last 24 hours) at 06/10/17 0821 Last data filed at 06/10/17 0100  Gross per 24 hour  Intake          1264.92 ml  Output             1750 ml  Net          -485.08 ml   Filed Weights   06/08/17 0035 06/08/17 0600 06/10/17 0100  Weight: 187 lb (84.8 kg) 193 lb 2 oz (87.6 kg) 192 lb 10.9 oz (87.4 kg)    Telemetry    Atrial fibrillation heart rate 60s to 100s - Personally Reviewed  ECG    Atrial fibrillation 98 bpm, left axis deviation, LVH, T-wave abnormality consider lateral ischemia - Personally Reviewed  Physical Exam  Alert, oriented male in no distress GEN: No acute  distress.   Neck: No JVD Cardiac:  irregularly irregular, no murmurs, rubs, or gallops.  Respiratory: Clear to auscultation bilaterally. GI: Soft, nontender, non-distended  MS: No edema; No deformity. Neuro:  Nonfocal  Psych: Normal affect   Labs    Chemistry Recent Labs Lab 06/08/17 0043 06/08/17 0527 06/09/17 0151  NA 138 139 136  K 4.0 4.0 3.6  CL 106 106 105  CO2 25 24 22   GLUCOSE 140* 130* 126*  BUN 20 21* 22*  CREATININE 1.60* 1.48* 1.65*  CALCIUM 9.6 9.4 9.4  PROT  --  6.0*  --   ALBUMIN  --  3.6  --   AST  --  29  --   ALT  --  38  --   ALKPHOS  --  37*  --   BILITOT  --  0.6  --   GFRNONAA 48* 52* 46*  GFRAA 55* >60 53*  ANIONGAP 7 9 9      Hematology Recent Labs Lab 06/08/17 [phone]  06/08/17 0527  WBC 11.0* 8.8  RBC 6.60* 5.87*  HGB 17.7* 15.8  HCT 54.3* 48.0  MCV 82.3 81.8  MCH 26.8 26.9  MCHC 32.6 32.9  RDW 15.3 15.2  PLT 179 148*    Cardiac Enzymes Recent Labs Lab 06/08/17 0527 06/08/17 1105 06/08/17 1649  TROPONINI 0.17* 0.42* 0.48*    Recent Labs Lab 06/08/17 0048  TROPIPOC 0.08     BNPNo results for input(s): BNP, PROBNP in the last 168 hours.   DDimer No results for input(s): DDIMER in the last 168 hours.   Radiology    No results found.  Cardiac Studies   2-D echocardiogram 03/02/2017: Study Conclusions  - Left ventricle: Inferobasal hypokinesis The cavity size was   normal. Wall thickness was increased in a pattern of mild LVH.   Systolic function was normal. The estimated ejection fraction was   in the range of 55% to 60%. - Aortic valve: There was mild regurgitation. - Mitral valve: There was mild regurgitation. - Atrial septum: No defect or patent foramen ovale was identified. - Impressions: Abnormal GLS -11.  Patient Profile     56 y.o. male with chronic kidney disease and history of renal transplantation, coronary artery disease status post CABG 2013, presents with symptomatic atrial flutter with  RVR  Assessment & Plan    1. Atrial flutter/fibrillation: Reviewed initial data when patient presented with atrial flutter, now appears to be in atrial fibrillation. Even at rest his heart rate is variable and up to the low 100s. I think we should move forward with cardioversion today. The patient is nothing by mouth. He has been anticoagulated on apixaban for greater than 1 month. Will transfer him to [facility] for cardioversion if there is a spot available on the schedule. Risks, indications, and alternatives are reviewed with the patient.  2. HTN: medications reviewed and will be continued. Changed to metoprolol yesterday for better HR control.   3. CKD s/p renal transplantation: stable  Dispo: could consider DC home if successfully cardioverted with close FU in the [facility].  Deatra James, MD  06/10/2017, 8:21 AM

## 2017-06-11 ENCOUNTER — Encounter (HOSPITAL_COMMUNITY): Payer: Self-pay | Admitting: Internal Medicine

## 2017-06-11 DIAGNOSIS — I4892 Unspecified atrial flutter: Secondary | ICD-10-CM

## 2017-06-11 DIAGNOSIS — I472 Ventricular tachycardia: Secondary | ICD-10-CM

## 2017-06-11 DIAGNOSIS — I4891 Unspecified atrial fibrillation: Secondary | ICD-10-CM

## 2017-06-11 DIAGNOSIS — Z940 Kidney transplant status: Secondary | ICD-10-CM

## 2017-06-11 DIAGNOSIS — I248 Other forms of acute ischemic heart disease: Secondary | ICD-10-CM

## 2017-06-11 DIAGNOSIS — I4729 Other ventricular tachycardia: Secondary | ICD-10-CM

## 2017-06-11 LAB — BASIC METABOLIC PANEL
Anion gap: 8 (ref 5–15)
BUN: 26 mg/dL — ABNORMAL HIGH (ref 6–20)
CALCIUM: 9.3 mg/dL (ref 8.9–10.3)
CO2: 24 mmol/L (ref 22–32)
Chloride: 106 mmol/L (ref 101–111)
Creatinine, Ser: 1.51 mg/dL — ABNORMAL HIGH (ref 0.61–1.24)
GFR calc non Af Amer: 51 mL/min — ABNORMAL LOW (ref >60)
GFR, EST AFRICAN AMERICAN: 59 mL/min — AB (ref >60)
Glucose, Bld: 123 mg/dL — ABNORMAL HIGH (ref 65–99)
POTASSIUM: 4.4 mmol/L (ref 3.5–5.1)
Sodium: 138 mmol/L (ref 135–145)

## 2017-06-11 LAB — MAGNESIUM: MAGNESIUM: 1.7 mg/dL (ref 1.7–2.4)

## 2017-06-11 MED ORDER — METOPROLOL TARTRATE 50 MG PO TABS: 50.0000 mg | ORAL_TABLET | Freq: Two times a day (BID) | ORAL | 0 refills | Status: DC

## 2017-06-11 MED ORDER — MAGNESIUM SULFATE 2 GM/50ML IV SOLN
2.0000 g | Freq: Once | INTRAVENOUS | Status: AC
  Administered 2017-06-11: 2 g via INTRAVENOUS
  Filled 2017-06-11: qty 50

## 2017-06-11 MED FILL — METOPROLOL TARTRATE 50 MG T: 50 | 30 days supply | Qty: 60 | Fill #0

## 2017-06-11 NOTE — Progress Notes (Signed)
0159. Pt had a 3 beat run of V-tach. Strip printed and placed in paper chart. Will continue to monitor.

## 2017-06-11 NOTE — Progress Notes (Signed)
Pt being discharged home. RN went over discharge pprwork with pt and spouse, exhibits understadnign. Belongings sent with pt.

## 2017-06-11 NOTE — Discharge Summary (Addendum)
Physician Discharge Summary  [name] WLN:[id number] DOB: 1961-09-20 DOA: 06/08/2017  PCP: Hulan Fess, MD Primary nephrologist: Dr. Lorrene Reid  Admit date: 06/08/2017 Discharge date: 06/11/2017  Admitted From: Home Disposition:  Home   Recommendations for Outpatient Follow-up:  1. Follow up with EP Dr. Caryl Comes on 06/28/2017. 2. Patient has appointment with his nephrologist on 7/17  Home Health: None Equipment/Devices: None  Discharge Condition: Fair CODE STATUS: Full code Diet recommendation: Heart Healthy     Discharge Diagnoses:  Principal Problem:   Atrial fibrillation with RVR ([place])  Active Problems:   S/P CABG (coronary artery bypass graft)   Long term (current) use of anticoagulants   History of renal transplant   Atrial flutter ([facility])   CAD (coronary artery disease)   Pure hypercholesterolemia   Demand ischemia ([facility])   Hypomagnesemia   NSVT (nonsustained ventricular tachycardia) ([place])  Brief narrative/history of present illness Please refer to admission H&P for details, in brief, 56 year old male with history of A. fib on eliquis, (rate controlled with Cardizem and labetalol), CAD with history of CABG, renal transplant on immunosuppressants who presented to the ED with palpitations since the previous night. Patient reported having some URI symptoms past few days. Reported some chest tightness with these symptoms. Denied any fevers, shortness of breath, nausea, vomiting, abdominal pain, bowel or urinary symptoms.  In the ED patient was found to be in a flutter with RVR. Received Cardizem bolus following which she went into rapid A. fib and became hypotensive. Patient was then started on amiodarone infusion after ED physician consulted cardiologist. He was placed on stepdown unit for further management.  Hospital course Atrial fibrillation/flutter with RVR Was placed on amiodarone drip. Became hypotensive after receiving Cardizem in the ED. Cardiology consult  appreciated. Both labetalol and Cardizem were held due to hypotension. He then was bradycardic into the 40s and amiodarone was discontinued. Patient underwent successful DC cardioversion at [facility] on 7/10. He had 14 beat run of V. tach after returning [facility] from the procedure. Pain resume was 1.6 and was replenished. He has been started on metoprolol twice daily and heart rate well controlled. Cardizem and labetalol have been discontinued. Patient has appointment with Dr. Caryl Comes on 7/26.  VLoralee Pacas He had 14 beat run of V. tach yesterday afternoon, was asymptomatic. Low magnesium was replenished. He then again had 3 episodes of about 4-5 beats of V. tach yesterday. This has been better controlled with metoprolol.  Essential hypertension Was hypotensive on admission after receiving Cardizem bolus. Now blood pressure stable. Continue clonidine and discharged on metoprolol.  Chronic kidney disease stage III with history of renal transplant.  on continue immunosuppressants. Renal function at baseline. Follows with Dr. Lorrene Reid as outpatient.  Chronic hypokalemia/hypomagnesemia Replenished. Magnesium was 1.7 this morning and I ordered another 2gm magnesium sulfate. Continue home dose potassium and magnesium supplements.  Hyperlipidemia Continue statin.  Patient is clinically stable to be discharged home with outpatient follow-up.  Consults: Cardiology  Procedure: DC cardioversion Family communication: None at bedside   Discharge Instructions   Allergies as of 06/11/2017      Reactions   Contrast Media [iodinated Diagnostic Agents] Other (See Comments)   Pt states that this medication causes him to code.    Penicillins Itching, Rash, Other (See Comments)   Has patient had a PCN reaction causing immediate rash, facial/tongue/throat swelling, SOB or lightheadedness with hypotension: No Has patient had a PCN reaction causing severe rash involving mucus membranes or skin  necrosis: No Has patient had a PCN reaction that required hospitalization No Has patient had a PCN reaction occurring within the last 10 years: No If all of the above answers are "NO", then may proceed with Cephalosporin use.      Medication List    STOP taking these medications   diltiazem 60 MG tablet Commonly known as:  CARDIZEM   labetalol 200 MG tablet Commonly known as:  NORMODYNE     TAKE these medications   allopurinol 100 MG tablet Commonly known as:  ZYLOPRIM Take 200 mg by mouth daily.   apixaban 5 MG Tabs tablet Commonly known as:  ELIQUIS Take 1 tablet (5 mg total) by mouth 2 (two) times daily.   aspirin 81 MG EC tablet Take 1 tablet (81 mg total) by mouth daily. What changed:  when to take this   cloNIDine 0.2 MG tablet Commonly known as:  CATAPRES Take 0.2 mg by mouth at bedtime.   fenofibrate micronized 134 MG capsule Commonly known as:  LOFIBRA Take 134 mg by mouth daily.   LOVAZA 1 g capsule Generic drug:  omega-3 acid ethyl esters Take 2 g by mouth 2 (two) times daily.   magnesium oxide 400 (241.3 Mg) MG tablet Commonly known as:  MAG-OX Take 400 mg by mouth 2 (two) times daily.   metoprolol tartrate 50 MG tablet Commonly known as:  LOPRESSOR Take 1 tablet (50 mg total) by mouth 2 (two) times daily.   mycophenolate 180 MG EC tablet Commonly known as:  MYFORTIC Take 720 mg by mouth 2 (two) times daily.   omeprazole 20 MG capsule Commonly known as:  PRILOSEC Take 20 mg by mouth daily.   potassium chloride SA 15 MEQ tablet Commonly known as:  KLOR-CON M15 Take 30 mEq by mouth 2 (two) times daily.   predniSONE 5 MG tablet Commonly known as:  DELTASONE Take 5 mg by mouth at bedtime.   simvastatin 40 MG tablet Commonly known as:  ZOCOR Take 40 mg by mouth See admin instructions. Pt takes one tablet every day except Friday and Saturday.   sulfamethoxazole-trimethoprim 400-80 MG tablet Commonly known as:  BACTRIM,SEPTRA Take 1 tablet  by mouth every Monday, Wednesday, and Friday.   tacrolimus 1 MG capsule Commonly known as:  PROGRAF Take 1 mg by mouth 2 (two) times daily.   Vitamin D (Ergocalciferol) 50000 units Caps capsule Commonly known as:  DRISDOL Take 50,000 Units by mouth 2 (two) times a week. Pt takes on Tuesday and Saturday.      Follow-up Information    Jamal Maes, MD Follow up on 06/17/2017.   Specialty:  Nephrology Why:  has appt Contact information: [place] [address] [phone]          Allergies  Allergen Reactions  . Contrast Media [Iodinated Diagnostic Agents] Other (See Comments)    Pt states that this medication causes him to code.   Marland Kitchen Penicillins Itching, Rash and Other (See Comments)    Has patient had a PCN reaction causing immediate rash, facial/tongue/throat swelling, SOB or lightheadedness with hypotension: No Has patient had a PCN reaction causing severe rash involving mucus membranes or skin necrosis: No Has patient had a PCN reaction that required hospitalization No Has patient had a PCN reaction occurring within the last 10 years: No If all of the above answers are "NO", then may proceed with Cephalosporin use.      Procedures/Studies: Dg Chest 2 View  Result Date: 06/08/2017 CLINICAL DATA:  Acute onset of generalized chest pain and tachycardia. Nausea and vomiting. Initial encounter. EXAM: CHEST  2 VIEW COMPARISON:  Chest radiograph performed 03/01/2017 FINDINGS: The lungs are well-aerated and clear. There is no evidence of focal opacification, pleural effusion or pneumothorax. The heart is borderline normal in size. The patient is status post median sternotomy, with evidence of prior CABG. No acute osseous abnormalities are seen. IMPRESSION: No acute cardiopulmonary process seen. Electronically Signed   By: Garald Balding M.D.   On: 06/08/2017 00:37        Subjective: Had 14 runs of V. tach after returning from cardioversion yesterday afternoon.  He then have about 3 runs of V. tach (4-5 runs each). Denies any chest pain or further palpitations. Once to go home.  Discharge Exam: Vitals:   06/11/17 0800 06/11/17 0900  BP: 133/81   Pulse: (!) 32 93  Resp: 17 18  Temp: 98.3 F (36.8 C)    Vitals:   06/11/17 0600 06/11/17 0730 06/11/17 0800 06/11/17 0900  BP: (!) 152/84 (!) 157/107 133/81   Pulse: (!) 48  (!) 32 93  Resp: 18  17 18   Temp:   98.3 F (36.8 C)   TempSrc:   Oral   SpO2: 98%  96% 97%  Weight:      Height:        General: Middle aged male not in distress HEENT: Moist mucosa, supple neck, Chest: Clear to auscultation bilaterally Cardiovascular: S1 and S2 irregular, no murmurs rub or gallop GI: Soft, nondistended, nontender Musculoskeletal: Warm, no edema     The results of significant diagnostics from this hospitalization (including imaging, microbiology, ancillary and laboratory) are listed below for reference.     Microbiology: Recent Results (from the past 240 hour(s))  MRSA PCR Screening     Status: None   Collection Time: 06/08/17  6:34 AM  Result Value Ref Range Status   MRSA by PCR NEGATIVE NEGATIVE Final    Comment:        The GeneXpert MRSA Assay (FDA approved for NASAL specimens only), is one component of a comprehensive MRSA colonization surveillance program. It is not intended to diagnose MRSA infection nor to guide or monitor treatment for MRSA infections.      Labs: BNP (last 3 results) No results for input(s): BNP in the last 8760 hours. Basic Metabolic Panel:  Recent Labs Lab 06/08/17 0043 06/08/17 0526 06/08/17 0527 06/09/17 0151 06/10/17 0834 06/11/17 0313  NA 138  --  139 136 139 138  K 4.0  --  4.0 3.6 4.4 4.4  CL 106  --  106 105 106 106  CO2 25  --  24 22 24 24   GLUCOSE 140*  --  130* 126* 124* 123*  BUN 20  --  21* 22* 21* 26*  CREATININE 1.60*  --  1.48* 1.65* 1.47* 1.51*  CALCIUM 9.6  --  9.4 9.4 9.6 9.3  MG  --  1.5*  --  1.6* 1.6* 1.7   Liver  Function Tests:  Recent Labs Lab 06/08/17 0527  AST 29  ALT 38  ALKPHOS 37*  BILITOT 0.6  PROT 6.0*  ALBUMIN 3.6   No results for input(s): LIPASE, AMYLASE in the last 168 hours. No results for input(s): AMMONIA in the last 168 hours. CBC:  Recent Labs Lab 06/08/17 0043 06/08/17 0527  WBC 11.0* 8.8  NEUTROABS  --  6.7  HGB 17.7* 15.8  HCT 54.3* 48.0  MCV 82.3 81.8  PLT 179  148*   Cardiac Enzymes:  Recent Labs Lab 06/08/17 0527 06/08/17 1105 06/08/17 1649  TROPONINI 0.17* 0.42* 0.48*   BNP: Invalid input(s): POCBNP CBG: No results for input(s): GLUCAP in the last 168 hours. D-Dimer No results for input(s): DDIMER in the last 72 hours. Hgb A1c No results for input(s): HGBA1C in the last 72 hours. Lipid Profile No results for input(s): CHOL, HDL, LDLCALC, TRIG, CHOLHDL, LDLDIRECT in the last 72 hours. Thyroid function studies No results for input(s): TSH, T4TOTAL, T3FREE, THYROIDAB in the last 72 hours.  Invalid input(s): FREET3 Anemia work up No results for input(s): VITAMINB12, FOLATE, FERRITIN, TIBC, IRON, RETICCTPCT in the last 72 hours. Urinalysis    Component Value Date/Time   COLORURINE YELLOW 06/08/2017 2049   APPEARANCEUR CLEAR 06/08/2017 2049   LABSPEC 1.012 06/08/2017 2049   PHURINE 6.0 06/08/2017 2049   GLUCOSEU NEGATIVE 06/08/2017 2049   HGBUR SMALL (A) 06/08/2017 2049   BILIRUBINUR NEGATIVE 06/08/2017 2049   KETONESUR NEGATIVE 06/08/2017 2049   PROTEINUR NEGATIVE 06/08/2017 2049   UROBILINOGEN 1.0 11/21/2014 1329   NITRITE NEGATIVE 06/08/2017 2049   LEUKOCYTESUR TRACE (A) 06/08/2017 2049   Sepsis Labs Invalid input(s): PROCALCITONIN,  WBC,  LACTICIDVEN Microbiology Recent Results (from the past 240 hour(s))  MRSA PCR Screening     Status: None   Collection Time: 06/08/17  6:34 AM  Result Value Ref Range Status   MRSA by PCR NEGATIVE NEGATIVE Final    Comment:        The GeneXpert MRSA Assay (FDA approved for NASAL  specimens only), is one component of a comprehensive MRSA colonization surveillance program. It is not intended to diagnose MRSA infection nor to guide or monitor treatment for MRSA infections.      Time coordinating discharge: Over 30 minutes  SIGNED:   Louellen Molder, MD  [facility] 06/11/2017, 11:29 AM Pager   If 7PM-7AM, please contact night-coverage [email or web address] Password [email or web address]

## 2017-06-11 NOTE — Discharge Instructions (Signed)
Atrial Flutter °Atrial flutter is a type of abnormal heart rhythm (arrhythmia). In atrial flutter, the heartbeat is fast but regular. There are two types of atrial flutter: °· Paroxysmal atrial flutter. This type starts suddenly. It usually stops on its own soon after it starts. °· Permanent atrial flutter. This type does not go away. ° °What are the causes? °This condition may be caused by: °· A heart condition or problem, such as: °? A heart attack. °? Heart failure. °? A heart valve problem. °· A lung problem, such as: °? A blood clot in the lungs (pulmonary embolism, or PE). °? Chronic obstructive pulmonary disease. °· Poorly controlled high blood pressure (hypertension). °· Hyperthyroidism. °· Caffeine. °· Some decongestant cold medicines. °· Low levels of minerals called electrolytes in the blood. °· Cocaine. ° °What increases the risk? °This condition is more likely to develop in: °· Elderly adults. °· Men. ° °What are the signs or symptoms? °Symptoms of this condition include: °· A feeling that your heart is pounding or racing (palpitations). °· Shortness of breath. °· Chest pain. °· Feeling light-headed. °· Dizziness. °· Fainting. ° °How is this diagnosed? °This condition may be diagnosed with tests, including: °· An electrocardiogram (ECG). This is a painless test that records electrical signals in the heart. °· Holter monitoring. For this test, you wear a device that records your heartbeat for 1-2 days. °· Cardiac event monitoring. For this test, you wear a device that records your heartbeat for up to 30 days. °· An echocardiogram. This is a painless test that uses sound waves to make a picture of your heart. °· Stress test. This test records your heartbeat while you exercise. °· Blood tests. ° °How is this treated? °This condition may be treated with: °· Treatment of any underlying conditions. °· Medicine to make your heart beat more slowly. °· Medicine to keep the condition from coming back. °· A  procedure to keep the condition under control. Some procedures to do this include: °? Cardioversion. During this procedure, medicines or an electrical shock are given to make the heart beat normally. °? Ablation. During this procedure, the heart tissue that is causing the problem is destroyed. This procedure may be done if atrial flutter lasts a long time or happens often. ° °Follow these instructions at home: °· Take over-the-counter and prescription medicines only as told by your health care provider. °· Do not take any new medicines without talking to your health care provider. °· Do not use tobacco products, including cigarettes, chewing tobacco, or e-cigarettes. If you need help quitting, ask your health care provider. °· Limit alcohol intake to no more than 1 drink per day for nonpregnant women and 2 drinks per day for men. One drink equals 12 oz of beer, 5 oz of wine, or 1½ oz of hard liquor. °· Try to reduce any stress. Stress can make your symptoms worse. °Contact a health care provider if: °· Your symptoms get worse. °Get help right away if: °· You are dizzy. °· You feel like fainting or you faint. °· You have shortness of breath. °· You feel pain or pressure in your chest. °· You suddenly feel nauseous or you suddenly vomit. °· There is a sudden change in your ability to speak, eat, or move. °· You are sweating a lot for no reason. °This information is not intended to replace advice given to you by your health care provider. Make sure you discuss any questions you have with your health care   provider. Document Released: 04/06/2009 Document Revised: 03/27/2016 Document Reviewed: 06/02/2015 Elsevier Interactive Patient Education  2018 [employer].   Hospital doctor cardioversion is the delivery of a jolt of electricity to restore a normal rhythm to the heart. A rhythm that is too fast or is not regular keeps the heart from pumping well. In this procedure, sticky patches or metal  paddles are placed on the chest to deliver electricity to the heart from a device. This procedure may be done in an emergency if:  There is low or no blood pressure as a result of the heart rhythm.  Normal rhythm must be restored as fast as possible to protect the brain and heart from further damage.  It may save a life.  This procedure may also be done for irregular or fast heart rhythms that are not immediately life-threatening. Tell a health care provider about:  Any allergies you have.  All medicines you are taking, including vitamins, herbs, eye drops, creams, and over-the-counter medicines.  Any problems you or family members have had with anesthetic medicines.  Any blood disorders you have.  Any surgeries you have had.  Any medical conditions you have.  Whether you are pregnant or may be pregnant. What are the risks? Generally, this is a safe procedure. However, problems may occur, including:  Allergic reactions to medicines.  A blood clot that breaks free and travels to other parts of your body.  The possible return of an abnormal heart rhythm within hours or days after the procedure.  Your heart stopping (cardiac arrest). This is rare.  What happens before the procedure? Medicines  Your health care provider may have you start taking: ? Blood-thinning medicines (anticoagulants) so your blood does not clot as easily. ? Medicines may be given to help stabilize your heart rate and rhythm.  Ask your health care provider about changing or stopping your regular medicines. This is especially important if you are taking diabetes medicines or blood thinners. General instructions  Plan to have someone take you home from the hospital or clinic.  If you will be going home right after the procedure, plan to have someone with you for 24 hours.  Follow instructions from your health care provider about eating or drinking restrictions. What happens during the  procedure?  To lower your risk of infection: ? Your health care team will wash or sanitize their hands. ? Your skin will be washed with soap.  An IV tube will be inserted into one of your veins.  You will be given a medicine to help you relax (sedative).  Sticky patches (electrodes) or metal paddles may be placed on your chest.  An electrical shock will be delivered. The procedure may vary among health care providers and hospitals. What happens after the procedure?  Your blood pressure, heart rate, breathing rate, and blood oxygen level will be monitored until the medicines you were given have worn off.  Do not drive for 24 hours if you were given a sedative.  Your heart rhythm will be watched to make sure it does not change. This information is not intended to replace advice given to you by your health care provider. Make sure you discuss any questions you have with your health care provider. Document Released: 11/08/2002 Document Revised: 07/17/2016 Document Reviewed: 05/24/2016 Elsevier Interactive Patient Education  2017 [employer].

## 2017-06-11 NOTE — Progress Notes (Signed)
Progress Note  Patient Name: [name] Date of Encounter: 06/11/2017  Primary Cardiologist: Dr, Maryellen Pile  Subjective   Patient successfully cardioverted and brought back to [facility] yesterday. He had a 14 beat run of Vtach yesterday and  Magnesium was replaced, level was 1.6 mg. Patient started back on Lopressor.   Unfortunately he has had 3-4 episodes of 4-5 beats of v-tach, last episode around 10:30 this AM. Patient said that if we aren't discharging him then his plan is to sign out AMA.   Inpatient Medications    Scheduled Meds: . allopurinol  200 mg Oral Daily  . apixaban  5 mg Oral BID  . aspirin EC  81 mg Oral QHS  . cloNIDine  0.2 mg Oral QHS  . fenofibrate  160 mg Oral Daily  . magnesium oxide  400 mg Oral BID  . metoprolol tartrate  50 mg Oral BID  . mycophenolate  720 mg Oral BID  . omega-3 acid ethyl esters  2 g Oral BID  . pantoprazole  40 mg Oral Daily  . predniSONE  5 mg Oral QHS  . simvastatin  40 mg Oral Once per day on Sun Mon Tue Wed Thu  . sodium chloride flush  3 mL Intravenous Q12H  . sulfamethoxazole-trimethoprim  1 tablet Oral Q M,W,F  . tacrolimus  1 mg Oral BID  . Vitamin D (Ergocalciferol)  50,000 Units Oral Once per day on Tue Sat   Continuous Infusions: . sodium chloride Stopped (06/10/17 1110)   PRN Meds: acetaminophen **OR** acetaminophen, ALPRAZolam, alum & mag hydroxide-simeth, morphine injection, nitroGLYCERIN, sodium chloride flush   Vital Signs    Vitals:   06/11/17 0600 06/11/17 0730 06/11/17 0800 06/11/17 0900  BP: (!) 152/84 (!) 157/107 133/81   Pulse: (!) 48  (!) 32 93  Resp: 18  17 18   Temp:   98.3 F (36.8 C)   TempSrc:   Oral   SpO2: 98%  96% 97%  Weight:      Height:        Intake/Output Summary (Last 24 hours) at 06/11/17 1052 Last data filed at 06/11/17 0830  Gross per 24 hour  Intake              830 ml  Output              350 ml  Net              480 ml   Filed Weights   06/10/17 0100 06/10/17 0953  06/11/17 0100  Weight: 192 lb 10.9 oz (87.4 kg) 192 lb (87.1 kg) 195 lb 15.8 oz (88.9 kg)    Telemetry    Sinus rhyth,  Unfortunately he has had 3-4 episodes of 4-5 beats of v-tach- Personally Reviewed   Physical Exam   Alert, oriented male in no distress GEN:No acute distress.   Neck:No JVD Cardiac: regualr rate and rhythm, no murmurs, rubs, or gallops.  Respiratory:Clear to auscultation bilaterally. TZ:GYFV, nontender, non-distended  MS:No edema; No deformity. Neuro:Nonfocal  Psych: Normal affect   Labs    Chemistry Recent Labs Lab 06/08/17 0527 06/09/17 0151 06/10/17 0834 06/11/17 0313  NA 139 136 139 138  K 4.0 3.6 4.4 4.4  CL 106 105 106 106  CO2 24 22 24 24   GLUCOSE 130* 126* 124* 123*  BUN 21* 22* 21* 26*  CREATININE 1.48* 1.65* 1.47* 1.51*  CALCIUM 9.4 9.4 9.6 9.3  PROT 6.0*  --   --   --  ALBUMIN 3.6  --   --   --   AST 29  --   --   --   ALT 38  --   --   --   ALKPHOS 37*  --   --   --   BILITOT 0.6  --   --   --   GFRNONAA 52* 46* 53* 51*  GFRAA >60 53* >60 59*  ANIONGAP 9 9 9 8      Hematology Recent Labs Lab 06/08/17 0043 06/08/17 0527  WBC 11.0* 8.8  RBC 6.60* 5.87*  HGB 17.7* 15.8  HCT 54.3* 48.0  MCV 82.3 81.8  MCH 26.8 26.9  MCHC 32.6 32.9  RDW 15.3 15.2  PLT 179 148*    Cardiac Enzymes Recent Labs Lab 06/08/17 0527 06/08/17 1105 06/08/17 1649  TROPONINI 0.17* 0.42* 0.48*    Recent Labs Lab 06/08/17 0048  TROPIPOC 0.08      Radiology    No results found.  Cardiac Studies   2-D echocardiogram 03/02/2017: Study Conclusions  - Left ventricle: Inferobasal hypokinesis The cavity size was normal. Wall thickness was increased in a pattern of mild LVH. Systolic function was normal. The estimated ejection fraction was in the range of 55% to 60%. - Aortic valve: There was mild regurgitation. - Mitral valve: There was mild regurgitation. - Atrial septum: No defect or patent foramen ovale was  identified. - Impressions: Abnormal GLS -11.   Patient Profile     56 y.o. male with chronic kidney disease and history of renal transplantation, coronary artery disease status post CABG 2013, presents with symptomatic atrial flutter with RVR  Assessment & Plan   1. Vtach:  Patient had an episode of 14 beat of Vtach. Unfortunately he has had 3-4 episodes of 4-5 beats of v-tach,Lopressor restarted and no further episodes today. Pt says he is going home today, regardless, even if he has to sign out AMA.  2. Atrial flutter/fibrillation: Successful DCCV yesterday.  Plan for close follow-up in Afib clinic.  3. HTN: stable.  4. CKD s/p renal transplanation: stable  Signed, Linus Mako, PA-C  06/11/2017, 10:52 AM    Attending Note:   The patient was seen and examined.  Agree with assessment and plan as noted above.  Changes made to the above note as needed.    Pt was discharged before I was able to see him        Ramond Dial., MD, [facility] 06/11/2017, 2:42 PM [address]  [place] Pager [phone]

## 2017-06-13 ENCOUNTER — Encounter: Payer: Self-pay | Admitting: *Deleted

## 2017-06-13 ENCOUNTER — Other Ambulatory Visit: Payer: Self-pay | Admitting: *Deleted

## 2017-06-13 ENCOUNTER — Encounter (HOSPITAL_COMMUNITY): Payer: Self-pay | Admitting: Internal Medicine

## 2017-06-13 MED FILL — OMEPRAZOLE DR 20 MG CAPSULE: 20 | 30 days supply | Qty: 30 | Fill #3

## 2017-06-13 MED FILL — VIT D2 1.25 MG (50,000 UNIT: 1.25 MG | 35 days supply | Qty: 10 | Fill #5

## 2017-06-13 MED FILL — ALLOPURINOL 100 MG TABLET: 100 | 30 days supply | Qty: 60 | Fill #2

## 2017-06-13 MED FILL — predniSONE 5 MG TABS: 5 | 90 days supply | Qty: 90 | Fill #3

## 2017-06-13 NOTE — Anesthesia Postprocedure Evaluation (Signed)
Anesthesia Post Note  Patient: [name]  Procedure(s) Performed: Procedure(s) (LRB): CARDIOVERSION (N/A)     Patient location during evaluation: Endoscopy Anesthesia Type: General Level of consciousness: awake and alert Pain management: pain level controlled Vital Signs Assessment: post-procedure vital signs reviewed and stable Respiratory status: spontaneous breathing, nonlabored ventilation, respiratory function stable and patient connected to nasal cannula oxygen Cardiovascular status: blood pressure returned to baseline and stable Postop Assessment: no signs of nausea or vomiting Anesthetic complications: no    Last Vitals:  Vitals:   06/11/17 1000 06/11/17 1158  BP: 121/74   Pulse: (!) 43   Resp: 15   Temp:  36.5 C    Last Pain:  Vitals:   06/11/17 1158  TempSrc: Oral  PainSc:                  Aziza Stuckert

## 2017-06-13 NOTE — Patient Outreach (Addendum)
[facility]) Care Management  06/13/2017  [name] 1961/03/07 [id number]   Subjective: Telephone call to patient's home / mobile number, spoke with patient, and HIPAA verified.  Discussed [facility] Care Management UMR Transition of care follow up, patient voiced understanding, and is in agreement to follow up.   Patient states he is doing well, remembers speaking with this RNCM in the past, has returned to work, currently at work, has follow up appointment scheduled with nephrologist on 06/17/17 and cardiologist  on 06/26/17.   States he feels like he self cardioverted last night because he feels much better today, but may eventually have to have an ablation.  States his magnesium has been running on the low side of normal (1.5 - 1.6), is wondering if there is anything that he can do to proactively to keep it was from getting too low, or if any of his medications are causing it to be lower.  Discussed general education on magnesium how it affects the heart and kidneys.   RNCM advised patient to discuss his questions with pharmacist, cardiologist, and nephrologist, regarding his specific magnesium levels.    Patient voiced understanding, and states he will follow up with pharmacist, cardiologist, and nephrologist today regarding his questions.   Patient states he does not have any education material, transition of care, care coordination, disease management, disease monitoring, transportation, community resource, or pharmacy needs at this time. States he is very appreciative of the follow up and is in agreement to receive [facility] Management information.    Objective: Per [facility] point of care tool and chart review, patient hospitalized  06/08/17 -06/11/17  and 03/01/17 - 4/318 for atrial fibrillation.  Patient also hospitalized 02/16/17 -02/19/17 for Influenza B.   Had ED visit on  11/30/16 for chest pain.  Patient also has a history of hypertension, UTI, Renal transplant recipient, SIRS (systemic  inflammatory response syndrome), hyperlipidemia,  HOCM (hypertrophic obstructive cardiomyopathy), and status post CABG (coronary artery bypass graft). [facility] Care Management transition of care follow up completed on 02/24/17.    Assessment:  Received UMR Transition of care referral on 06/10/17. Transition of care follow up completed, no care management needs, and will proceed with case closure.   Plan: RNCM will send patient successful outreach letter, [facility] pamphlet, and magnet. RNCM will send case closure due to follow up completed / no care management needs request to [facility] [place] Management.   Kaytelyn Glore H. Annia Friendly, BSN, [place] Management [facility] Telephonic CM Phone: [phone] Fax: [phone]

## 2017-06-14 ENCOUNTER — Other Ambulatory Visit: Payer: Self-pay

## 2017-06-14 ENCOUNTER — Encounter (HOSPITAL_COMMUNITY): Payer: Self-pay | Admitting: Emergency Medicine

## 2017-06-14 ENCOUNTER — Emergency Department (HOSPITAL_COMMUNITY)
Admission: EM | Admit: 2017-06-14 | Discharge: 2017-06-14 | Disposition: A | Discharge status: Home / Self Care | Payer: 59 | Attending: Emergency Medicine | Admitting: Emergency Medicine

## 2017-06-14 DIAGNOSIS — Z8673 Personal history of transient ischemic attack (TIA), and cerebral infarction without residual deficits: Secondary | ICD-10-CM | POA: Diagnosis not present

## 2017-06-14 DIAGNOSIS — Z79899 Other long term (current) drug therapy: Secondary | ICD-10-CM | POA: Diagnosis not present

## 2017-06-14 DIAGNOSIS — I4891 Unspecified atrial fibrillation: Secondary | ICD-10-CM | POA: Insufficient documentation

## 2017-06-14 DIAGNOSIS — I2510 Atherosclerotic heart disease of native coronary artery without angina pectoris: Secondary | ICD-10-CM | POA: Diagnosis not present

## 2017-06-14 DIAGNOSIS — I119 Hypertensive heart disease without heart failure: Secondary | ICD-10-CM | POA: Diagnosis not present

## 2017-06-14 DIAGNOSIS — Z7982 Long term (current) use of aspirin: Secondary | ICD-10-CM | POA: Diagnosis not present

## 2017-06-14 DIAGNOSIS — Z7901 Long term (current) use of anticoagulants: Secondary | ICD-10-CM | POA: Diagnosis not present

## 2017-06-14 DIAGNOSIS — I428 Other cardiomyopathies: Secondary | ICD-10-CM | POA: Insufficient documentation

## 2017-06-14 DIAGNOSIS — Z951 Presence of aortocoronary bypass graft: Secondary | ICD-10-CM | POA: Diagnosis not present

## 2017-06-14 DIAGNOSIS — Z87891 Personal history of nicotine dependence: Secondary | ICD-10-CM | POA: Insufficient documentation

## 2017-06-14 DIAGNOSIS — Z86718 Personal history of other venous thrombosis and embolism: Secondary | ICD-10-CM | POA: Insufficient documentation

## 2017-06-14 DIAGNOSIS — E7800 Pure hypercholesterolemia, unspecified: Secondary | ICD-10-CM | POA: Diagnosis not present

## 2017-06-14 DIAGNOSIS — E785 Hyperlipidemia, unspecified: Secondary | ICD-10-CM | POA: Diagnosis not present

## 2017-06-14 DIAGNOSIS — N183 Chronic kidney disease, stage 3 (moderate): Secondary | ICD-10-CM | POA: Diagnosis not present

## 2017-06-14 DIAGNOSIS — I129 Hypertensive chronic kidney disease with stage 1 through stage 4 chronic kidney disease, or unspecified chronic kidney disease: Secondary | ICD-10-CM | POA: Insufficient documentation

## 2017-06-14 LAB — CBC
HCT: 55.1 % — ABNORMAL HIGH (ref 39.0–52.0)
Hemoglobin: 17.7 g/dL — ABNORMAL HIGH (ref 13.0–17.0)
MCH: 26.4 pg (ref 26.0–34.0)
MCHC: 32.1 g/dL (ref 30.0–36.0)
MCV: 82.2 fL (ref 78.0–100.0)
PLATELETS: 186 10*3/uL (ref 150–400)
RBC: 6.7 MIL/uL — AB (ref 4.22–5.81)
RDW: 15.3 % (ref 11.5–15.5)
WBC: 8.7 10*3/uL (ref 4.0–10.5)

## 2017-06-14 LAB — MAGNESIUM: MAGNESIUM: 1.7 mg/dL (ref 1.7–2.4)

## 2017-06-14 LAB — BASIC METABOLIC PANEL
Anion gap: 11 (ref 5–15)
BUN: 29 mg/dL — ABNORMAL HIGH (ref 6–20)
CALCIUM: 9.9 mg/dL (ref 8.9–10.3)
CO2: 23 mmol/L (ref 22–32)
CREATININE: 1.69 mg/dL — AB (ref 0.61–1.24)
Chloride: 102 mmol/L (ref 101–111)
GFR calc non Af Amer: 45 mL/min — ABNORMAL LOW (ref >60)
GFR, EST AFRICAN AMERICAN: 52 mL/min — AB (ref >60)
Glucose, Bld: 124 mg/dL — ABNORMAL HIGH (ref 65–99)
Potassium: 4.5 mmol/L (ref 3.5–5.1)
SODIUM: 136 mmol/L (ref 135–145)

## 2017-06-14 NOTE — ED Notes (Signed)
Lab to add on magnesium  

## 2017-06-14 NOTE — ED Notes (Addendum)
Pt not in room. Monitor leads on bed.  No recent IV charted.  Secretary states pt did not call out.  Bathrooms checked.  No belongings left in room. Dr. Ralene Bathe notified

## 2017-06-14 NOTE — ED Triage Notes (Signed)
P[t. Stated, I went into a fib this morning and Im trying to get myself out of it.

## 2017-06-14 NOTE — ED Provider Notes (Signed)
[place] DEPT Provider Note   CSN: [id number] Arrival date & time: 06/14/17  1221     History   Chief Complaint Chief Complaint  Patient presents with  . Atrial Fibrillation    HPI [name] is a 56 y.o. male.  The history is provided by the patient. No language interpreter was used.   [name] is a 56 y.o. male who presents to the Emergency Department complaining of afib.  He presents for evaluation of palpitations, history of recurrent atrial fibrillation/flutter. He was in his routine State of health when he was sitting reading this morning on the couch when he developed sudden onset of racing heartbeat with palpitations that occurred between 10 and 10:30. The symptoms began about 5 minutes after he took his routine metoprolol dose. He waited about 30 minutes and then he took his as needed Cardizem dose. He continued to have palpitations so he decided to present to the emergency department for further evaluation. He reports associated shortness of breath but denies any diaphoresis, chest pain on ED arrival he suddenly felt that his rate was controlled and felt significantly better. Past Medical History:  Diagnosis Date  . CAD (coronary artery disease)    a. s/p CABG 03/2012: LIMA to LAD, free RIMA to OM1, SVG to D1, sequential SVG to AM and LPLB2, EVH via right thigh and leg.  . Cellulitis 04/13/2012   RLE saphenous vein harvest incision   . Gout   . Hyperlipidemia   . Hyperparathyroidism   . Hypertension   . Hypertrophic cardiomyopathy ([place])   . Migraines   . PAF and Flutter    a. Afib post-op CABG; AFlutter 10/2012  . Peripheral vascular disease ([place])   . Sinus node dysfunction-post termination pause    a. >8sec in setting of aflutter 10/2012  . Stroke [facility]) 1995; 2001   denies residual  . Superficial vein thrombosis    a. R greater saphenous 04/2012    Patient Active Problem List   Diagnosis Date Noted  . Hypomagnesemia 06/11/2017  . NSVT  (nonsustained ventricular tachycardia) ([place]) 06/11/2017  . Atrial fibrillation with RVR ([place]) 06/09/2017  . Pure hypercholesterolemia   . Demand ischemia ([place])   . CAD (coronary artery disease)   . Atrial flutter ([place]) 03/01/2017  . History of renal transplant 02/16/2017  . Immunosuppressed status ([place]) 02/16/2017  . Hypertensive heart disease without CHF 02/16/2017  . Thrombocytopenia ([place]) 02/16/2017  . Long term (current) use of anticoagulants 11/02/2012  . Stroke ([place])   . S/P CABG (coronary artery bypass graft) 05/12/2012  . Smoking 01/15/2012  . CKD (chronic kidney disease), stage III 01/15/2012    Past Surgical History:  Procedure Laterality Date  . ATRIAL FLUTTER ABLATION N/A 11/09/2012   Procedure: ATRIAL FLUTTER ABLATION;  Surgeon: [name], MD;  Location: [facility] CATH LAB;  Service: Cardiovascular;  Laterality: N/A;  . AV FISTULA PLACEMENT  2011   left forearm  . AV FISTULA PLACEMENT    . CARDIAC CATHETERIZATION  03/16/12  . CARDIOVERSION N/A 06/10/2017   Procedure: CARDIOVERSION;  Surgeon: Pixie Casino, MD;  Location: [facility] ENDOSCOPY;  Service: Cardiovascular;  Laterality: N/A;  . CORONARY ARTERY BYPASS GRAFT  03/19/2012   Procedure: CORONARY ARTERY BYPASS GRAFTING (CABG);  Surgeon: Rexene Alberts, MD;  Location: [place];  Service: Open Heart Surgery;  Laterality: N/A;  (B) MAMMARY  . CYSTOSCOPY WITH BIOPSY N/A 08/15/2016   Procedure: CYSTOSCOPY WITH IDENTIFICATION OF RIGHT TRANSPLANTATION OF URETRAL ORFICE WITH  FLUORESCEIN;  Surgeon: Carolan Clines, MD;  Location: [facility] ORS;  Service: Urology;  Laterality: N/A;  . DENTAL SURGERY  2008   multiple  . [place]   left  . FRACTURE SURGERY    . HERNIA REPAIR  58/0998   umbilical  . HOLMIUM LASER APPLICATION N/A 3/38/2505   Procedure: HOLMIUM LASER APPLICATION WITH 3 CM RIGHT BLADDER STONE;  Surgeon: Carolan Clines, MD;  Location: [facility] ORS;  Service: Urology;  Laterality: N/A;  . INGUINAL HERNIA REPAIR   09/2009   bilaterally  . KIDNEY TRANSPLANT    . LEFT HEART CATHETERIZATION WITH CORONARY ANGIOGRAM N/A 03/16/2012   Procedure: LEFT HEART CATHETERIZATION WITH CORONARY ANGIOGRAM;  Surgeon: Sherren Mocha, MD;  Location[facility] CATH LAB;  Service: Cardiovascular;  Laterality: N/A;  . RENAL ARTERY STENT  2001  . TRANSURETHRAL RESECTION OF BLADDER TUMOR N/A 08/15/2016   Procedure: TRANSURETHRAL BIOPSY OF RIGHT BLADDER WALL;  Surgeon: Carolan Clines, MD;  Location: [facility] ORS;  Service: Urology;  Laterality: N/A;       Home Medications    Prior to Admission medications   Medication Sig Start Date End Date Taking? Authorizing Provider  allopurinol (ZYLOPRIM) 100 MG tablet Take 200 mg by mouth daily.     [provider]  apixaban (ELIQUIS) 5 MG TABS tablet Take 1 tablet (5 mg total) by mouth 2 (two) times daily. 03/13/17   Baldwin Jamaica, PA-C  aspirin 81 MG EC tablet Take 1 tablet (81 mg total) by mouth daily. Patient taking differently: Take 81 mg by mouth at bedtime.  10/28/12   Rogelia Mire, NP  cloNIDine (CATAPRES) 0.2 MG tablet Take 0.2 mg by mouth at bedtime.     [provider]  fenofibrate micronized (LOFIBRA) 134 MG capsule Take 134 mg by mouth daily.     [provider]  magnesium oxide (MAG-OX) 400 (241.3 Mg) MG tablet Take 400 mg by mouth 2 (two) times daily.    [provider]  metoprolol tartrate (LOPRESSOR) 50 MG tablet Take 1 tablet (50 mg total) by mouth 2 (two) times daily. 06/11/17   Dhungel, Flonnie Overman, MD  mycophenolate (MYFORTIC) 180 MG EC tablet Take 720 mg by mouth 2 (two) times daily.     [provider]  omega-3 acid ethyl esters (LOVAZA) 1 G capsule Take 2 g by mouth 2 (two) times daily.    [provider]  omeprazole (PRILOSEC) 20 MG capsule Take 20 mg by mouth daily.    [provider]  potassium chloride SA (KLOR-CON M15) 15 MEQ tablet Take 30 mEq by mouth 2 (two) times daily.    [provider]  predniSONE (DELTASONE) 5 MG tablet Take 5 mg by mouth at bedtime.    [provider]  simvastatin (ZOCOR) 40 MG tablet Take 40 mg by mouth See admin instructions. Pt takes one tablet every day except Friday and Saturday.    [provider]  sulfamethoxazole-trimethoprim (BACTRIM,SEPTRA) 400-80 MG tablet Take 1 tablet by mouth every Monday, Wednesday, and Friday.    [provider]  tacrolimus (PROGRAF) 1 MG capsule Take 1 mg by mouth 2 (two) times daily.    [provider]  Vitamin D, Ergocalciferol, (DRISDOL) 50000 units CAPS capsule Take 50,000 Units by mouth 2 (two) times a week. Pt takes on Tuesday and Saturday.    [provider]    Family History Family History  Problem Relation Age of Onset  . Adopted: Yes  .  Hypertension Other     Social History Social History  Substance Use Topics  . Smoking status: Former Smoker    Packs/day: 0.30    Years: 30.00    Types: Cigarettes    Quit date: 03/15/2012  . Smokeless tobacco: Never Used  . Alcohol use Yes     Comment: 04/13/12 "2 mixed drinks/month"     Allergies   Contrast media [iodinated diagnostic agents] and Penicillins   Review of Systems Review of Systems  All other systems reviewed and are negative.    Physical Exam Updated Vital Signs BP 101/69 (BP Location: Right Arm)   Pulse 73   Temp 97.6 F (36.4 C) (Oral)   Resp 20   SpO2 99%   Physical Exam  Constitutional: He is oriented to person, place, and time. He appears well-developed and well-nourished.  HENT:  Head: Normocephalic and atraumatic.  Cardiovascular: Normal rate.   No murmur heard. Irregular rhythm  Pulmonary/Chest: Effort normal and breath sounds normal. No respiratory distress.  Abdominal: Soft. There is no tenderness. There is no rebound and no guarding.  Musculoskeletal: He exhibits no edema or tenderness.  Neurological: He is alert and oriented to person, place, and time.  Skin: Skin is warm  and dry.  Psychiatric: He has a normal mood and affect. His behavior is normal.  Nursing note and vitals reviewed.    ED Treatments / Results  Labs (all labs ordered are listed, but only abnormal results are displayed) Labs Reviewed  BASIC METABOLIC PANEL  CBC    EKG  EKG Interpretation None       Radiology No results found.  Procedures Procedures (including critical care time)  Medications Ordered in ED Medications - No data to display   Initial Impression / Assessment and Plan / ED Course  I have reviewed the triage vital signs and the nursing notes.  Pertinent labs & imaging results that were available during my care of the patient were reviewed by me and considered in my medical decision making (see chart for details).     Patient with history of atrial fibrillation/flutter here for evaluation palpitations. His rate slowed down just following ED arrival. Plan to check electrolytes and reassess. Patient eloped from the department prior to being able to recent reassess or discuss his electrolytes/labs.  Final Clinical Impressions(s) / ED Diagnoses   Final diagnoses:  None    New Prescriptions New Prescriptions   No medications on file     Quintella Reichert, MD 06/14/17 1754

## 2017-06-17 DIAGNOSIS — Z940 Kidney transplant status: Secondary | ICD-10-CM | POA: Diagnosis not present

## 2017-06-17 DIAGNOSIS — D751 Secondary polycythemia: Secondary | ICD-10-CM | POA: Diagnosis not present

## 2017-06-17 DIAGNOSIS — I421 Obstructive hypertrophic cardiomyopathy: Secondary | ICD-10-CM | POA: Diagnosis not present

## 2017-06-17 DIAGNOSIS — E785 Hyperlipidemia, unspecified: Secondary | ICD-10-CM | POA: Diagnosis not present

## 2017-06-17 DIAGNOSIS — I2510 Atherosclerotic heart disease of native coronary artery without angina pectoris: Secondary | ICD-10-CM | POA: Diagnosis not present

## 2017-06-17 DIAGNOSIS — E559 Vitamin D deficiency, unspecified: Secondary | ICD-10-CM | POA: Diagnosis not present

## 2017-06-17 DIAGNOSIS — Z6831 Body mass index (BMI) 31.0-31.9, adult: Secondary | ICD-10-CM | POA: Diagnosis not present

## 2017-06-17 DIAGNOSIS — I129 Hypertensive chronic kidney disease with stage 1 through stage 4 chronic kidney disease, or unspecified chronic kidney disease: Secondary | ICD-10-CM | POA: Diagnosis not present

## 2017-06-17 MED FILL — MAGNESIUM OXIDE 400 MG TAB: 400 | 30 days supply | Qty: 180 | Fill #0

## 2017-06-17 MED FILL — METOPROLOL TARTRATE 25 MG T: 25 | 30 days supply | Qty: 60 | Fill #0

## 2017-06-26 ENCOUNTER — Ambulatory Visit (INDEPENDENT_AMBULATORY_CARE_PROVIDER_SITE_OTHER): Payer: 59 | Admitting: Internal Medicine

## 2017-06-26 ENCOUNTER — Encounter: Payer: Self-pay | Admitting: Internal Medicine

## 2017-06-26 VITALS — BP 140/76 | HR 64

## 2017-06-26 DIAGNOSIS — I491 Atrial premature depolarization: Secondary | ICD-10-CM | POA: Diagnosis not present

## 2017-06-26 DIAGNOSIS — I4892 Unspecified atrial flutter: Secondary | ICD-10-CM | POA: Diagnosis not present

## 2017-06-26 NOTE — Progress Notes (Signed)
Patient Care Team: Hulan Fess, MD as PCP - General (Family Medicine)   HPI  [name] is a 56 y.o. male Seen in followup for concerns about atrial fibrillation in the context of significant left atrial enlargement prior atrial flutter for which he underwent catheter ablation 2013 and left ventricular hypertrophy.  He had a history of ischemic heart disease with prior bypass surgery 4/13 and had post CABG atrial fibrillation.  He has had recurrent infrequent episodes of prolonged irregular tachycardia palpitations. These have been associated with fatigue and dyspnea and residual weakness. There reminiscent of his rhythm disturbances with atrial fibrillation. 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   9/17  CR 1.5  K 4.6  Untreated sleep apnea but this is better with 45 lb weight loss  Echocardiogram 9/15 demonstrated normal LV systolic function; qualitative comments were "severe concentric hypertrophy" although the measurements are 17/11 suggesting some degree of asymmetry Left atrial dimension was 53/2.4    MRI. 7/16 this demonstrated mid cavitary obliteration and asymmetric septal hypertrophy 18/11 consistent with HCM  ECG 3/16 demonstrated multiple atrial ectopic beats   He has a history of remote stroke. He has had repeated events of late with tachycardia palpitations prompting emergency room visit  this is been associated with some chest pressure  These have been repeated now since 3/18. ECGs were reviewed. They appeared to be atrial flutter with a P wave vector similar to typical flutter but amplitudes particularly in lead V1 which are very atypical. On some occasions he takes it when necessary diltiazem which about half the time is associated with normalization of rhythm.  history of resting bradycardia     Past Medical History:  Diagnosis  Date  . CAD (coronary artery disease)    a. s/p CABG 03/2012: LIMA to LAD, free RIMA to OM1, SVG to D1, sequential SVG to AM and LPLB2, EVH via right thigh and leg.  . Cellulitis 04/13/2012   RLE saphenous vein harvest incision   . Gout   . Hyperlipidemia   . Hyperparathyroidism   . Hypertension   . Hypertrophic cardiomyopathy ([place])   . Migraines   . PAF and Flutter    a. Afib post-op CABG; AFlutter 10/2012  . Peripheral vascular disease ([place])   . Sinus node dysfunction-post termination pause    a. >8sec in setting of aflutter 10/2012  . Stroke [facility]) 1995; 2001   denies residual  . Superficial vein thrombosis    a. R greater saphenous 04/2012    Past Surgical History:  Procedure Laterality Date  . ATRIAL FLUTTER ABLATION N/A 11/09/2012   Procedure: ATRIAL FLUTTER ABLATION;  Surgeon: [name], MD;  Location: [facility] C[facility] LAB;  Service: Cardiovascular;  Laterality: N/A;  . AV FISTULA PLACEMENT  2011   left forearm  . AV FISTULA PLACEMENT    . CARDIAC CATHETERIZATION  03/16/12  . CARDIOVERSION N/A 06/10/2017   Procedure: CARDIOVERSION;  Surgeon: Pixie Casino, MD;  Location: [facility] ENDOSCOPY;  Service: Cardiovascular;  Laterality: N/A;  . CORONARY ARTERY BYPASS GRAFT  03/19/2012   Procedure: CORONARY ARTERY BYPASS GRAFTING (CABG);  Surgeon: Rexene Alberts, MD;  Location: [place];  Service: Open Heart Surgery;  Laterality: N/A;  (B) MAMMARY  . CYSTOSCOPY WITH BIOPSY N/A 08/15/2016   Procedure: CYSTOSCOPY WITH IDENTIFICATION OF RIGHT TRANSPLANTATION OF URETRAL ORFICE WITH FLUORESCEIN;  Surgeon: Pierre Bali  Gaynelle Arabian, MD;  Location: [facility]S;  Service: Urology;  Laterality: N/A;  . DENTAL SURGERY  2008   multiple  . [place]   left  . FRACTURE SURGERY    . HERNIA REPAIR  25/9563   umbilical  . HOLMIUM LASER APPLICATION N/A 8/75/6433   Procedure: HOLMIUM LASER APPLICATION WITH 3 CM RIGHT BLADDER STONE;  Surgeon: Carolan Clines, MD;  Location: [facility] ORS;  Service: Urology;   Laterality: N/A;  . INGUINAL HERNIA REPAIR  09/2009   bilaterally  . KIDNEY TRANSPLANT    . LEFT HEART CATHETERIZATION WITH CORONARY ANGIOGRAM N/A 03/16/2012   Procedure: LEFT HEART CATHETERIZATION WITH CORONARY ANGIOGRAM;  Surgeon: Sherren Mocha, MD;  Location: [facility] CATH LAB;  Service: Cardiovascular;  Laterality: N/A;  . RENAL ARTERY STENT  2001  . TRANSURETHRAL RESECTION OF BLADDER TUMOR N/A 08/15/2016   Procedure: TRANSURETHRAL BIOPSY OF RIGHT BLADDER WALL;  Surgeon: Carolan Clines, MD;  [facility];  Service: Urology;  Laterality: N/A;    Current Outpatient Prescriptions  Medication Sig Dispense Refill  . allopurinol (ZYLOPRIM) 100 MG tablet Take 200 mg by mouth daily.     Marland Kitchen apixaban (ELIQUIS) 5 MG TABS tablet Take 1 tablet (5 mg total) by mouth 2 (two) times daily. 60 tablet 5  . cloNIDine (CATAPRES) 0.2 MG tablet Take 0.2 mg by mouth at bedtime.     . fenofibrate micronized (LOFIBRA) 134 MG capsule Take 134 mg by mouth daily.     . magnesium oxide (MAG-OX) 400 (241.3 Mg) MG tablet Take 400 mg by mouth 2 (two) times daily.    . metoprolol tartrate (LOPRESSOR) 50 MG tablet Take 1.5 tablet by mouth in the AM and 1 tablet by mouth in the PM    . mycophenolate (MYFORTIC) 180 MG EC tablet Take 720 mg by mouth 2 (two) times daily.     Marland Kitchen omega-3 acid ethyl esters (LOVAZA) 1 G capsule Take 2 g by mouth 2 (two) times daily.    Marland Kitchen omeprazole (PRILOSEC) 20 MG capsule Take 20 mg by mouth daily.  5  . potassium chloride SA (KLOR-CON M15) 15 MEQ tablet Take 30 mEq by mouth 2 (two) times daily.    . predniSONE (DELTASONE) 5 MG tablet Take 5 mg by mouth at bedtime.    . simvastatin (ZOCOR) 40 MG tablet Take 40 mg by mouth See admin instructions. Pt takes one tablet every day except Friday and Saturday.    . sulfamethoxazole-trimethoprim (BACTRIM,SEPTRA) 400-80 MG tablet Take 1 tablet by mouth every Monday, Wednesday, and Friday.  6  . tacrolimus (PROGRAF) 1 MG capsule Take 1 mg by mouth 2 (two)  times daily.  5  . Vitamin D, Ergocalciferol, (DRISDOL) 50000 units CAPS capsule Take 50,000 Units by mouth every 7 (seven) days. Pt takes on Tuesday  5   No current facility-administered medications for this visit.     Allergies  Allergen Reactions  . Contrast Media [Iodinated Diagnostic Agents] Other (See Comments)    Pt states that this medication causes him to code.   Marland Kitchen Penicillins Itching, Rash and Other (See Comments)    Has patient had a PCN reaction causing immediate rash, facial/tongue/throat swelling, SOB or lightheadedness with hypotension: No Has patient had a PCN reaction causing severe rash involving mucus membranes or skin necrosis: No Has patient had a PCN reaction that required hospitalization No Has patient had a PCN reaction occurring within the last 10 years: No If all of the above answers  are "NO", then may proceed with Cephalosporin use.      Review of Systems negative except from HPI and PMH  Physical Exam BP 140/76   Pulse 64  Well developed and nourished in no acute distress HENT normal Neck supple with JVP-flat Carotids brisk and full without bruits Clear Regular rate and rhythm, 3/6  Abd-soft with active BS without hepatomegaly No Clubbing cyanosis edema Skin-warm and dry A & Oriented  Grossly normal sensory and motor function   ECG sinus at 64 Intervals 14/14/57   LVH with report  Assessment and  Plan   Atrial Flutter  S/p RFCA recurrent  Freq PACs  LAE  LVH-Asymmetric 18/11  HCM    Sleep disordered breathing   Status post kidney transplant    Patient is having recurrent episodes of atrial flutter. As noted above, the flutter wave vectors are similar to typical flutter but the amplitudes are quite abnormal. He has abnormal atria whether this is sufficient to explain the flutter wave morphology I don't know. Given the frequency of recurrences, he would like to undertake catheter ablation. Given the concerns about the morphology,  and the fact that these are quite different from the initial typical flutter ECG 10/27/12 I will review with colleagues who might choose to pursue electro anatomical mapping of his flutter circuits.  We discussed the use of antiarrhythmic drugs. With his complicated drug regime, he would like to not do that. He will continue to use diltiazem on an as-needed basis. We will try and move relatively expeditiously for flutter ablation  We spent more than 50% of our >25 min visit in face to face counseling regarding the above

## 2017-06-26 NOTE — Patient Instructions (Signed)
Medication Instructions:  Your physician has recommended you make the following change in your medication:  1. STOP Aspirin  If you need a refill on your cardiac medications before your next appointment, please call your pharmacy.   Labwork: None ordered  Testing/Procedures: None ordered  Follow-Up: To be determined once Dr. Caryl Comes has spoken with other physicians.  Thank you for choosing [facility]!!

## 2017-07-04 DIAGNOSIS — Z940 Kidney transplant status: Secondary | ICD-10-CM | POA: Diagnosis not present

## 2017-07-04 MED FILL — TACROLIMUS 1 MG CAPSULE: 1 | 30 days supply | Qty: 60 | Fill #2

## 2017-07-04 MED FILL — SULFAMETHOXAZOLE/TMP SS TAB: 400-80 | 28 days supply | Qty: 12 | Fill #1

## 2017-07-04 MED FILL — POTASSIUM CITRATE ER 15 MEQ: 15 MEQ | 30 days supply | Qty: 120 | Fill #2

## 2017-07-04 MED FILL — MYCOPHENOLIC ACID DR 180 MG: 180 | 30 days supply | Qty: 240 | Fill #4

## 2017-07-04 MED FILL — ELIQUIS 5 MG TABLET: 5 | 30 days supply | Qty: 60 | Fill #4

## 2017-07-04 MED FILL — CIALIS 5 MG TABLET: 5 | 30 days supply | Qty: 30 | Fill #5

## 2017-07-04 MED FILL — FENOFIBRATE 134 MG CAPSULE: 134 | 30 days supply | Qty: 30 | Fill #4

## 2017-07-08 MED FILL — TACROLIMUS 0.5 MG CAPSULE: 0.5 | 30 days supply | Qty: 30 | Fill #0

## 2017-07-10 ENCOUNTER — Telehealth: Payer: Self-pay | Admitting: Internal Medicine

## 2017-07-10 NOTE — Telephone Encounter (Signed)
Patient calling, states that Dr. Caryl Comes had mentioned referring him to [facility] for an ablation and patient states that he has not heard any updates on the matter.

## 2017-07-11 ENCOUNTER — Encounter: Payer: Self-pay | Admitting: Internal Medicine

## 2017-07-11 MED FILL — METOPROLOL TARTRATE 25 MG T: 25 | 30 days supply | Qty: 60 | Fill #1

## 2017-07-11 MED FILL — ALLOPURINOL 100 MG TABLET: 100 | 30 days supply | Qty: 60 | Fill #3

## 2017-07-11 MED FILL — OMEPRAZOLE DR 20 MG CAPSULE: 20 | 30 days supply | Qty: 30 | Fill #4

## 2017-07-11 MED FILL — METOPROLOL TARTRATE 50 MG T: 50 | 90 days supply | Qty: 180 | Fill #0

## 2017-07-11 MED FILL — cloNIDine HCL 0.2 MG TABS: 0.2 | 90 days supply | Qty: 90 | Fill #1

## 2017-07-11 NOTE — Telephone Encounter (Signed)
Steven Sprang, MD  to [name]       07/11/17 8:26 AM  Spoke with DR Allred re ablation given your LA size He would be glad to see you and atalk with youabout this ( he is closer to on time than I ) and thinks prob a reasonable undertaking  [name]    I called and spoke with the patient and advised him of Dr. Olin Pia recommendations regarding referral to Dr. Rayann Heman.  The patient is agreeable. Staff message sent to [facility] in scheduling to contact patient.

## 2017-07-16 DIAGNOSIS — Z940 Kidney transplant status: Secondary | ICD-10-CM | POA: Diagnosis not present

## 2017-08-01 MED FILL — SULFAMETHOXAZOLE/TMP SS TAB: 400-80 | 28 days supply | Qty: 12 | Fill #2

## 2017-08-01 MED FILL — OMEGA-3 ETHYL ESTERS 1 GM C: 1 | 90 days supply | Qty: 360 | Fill #1

## 2017-08-01 MED FILL — CIALIS 5 MG TABLET: 5 | 30 days supply | Qty: 30 | Fill #6

## 2017-08-01 MED FILL — ELIQUIS 5 MG TABLET: 5 | 30 days supply | Qty: 60 | Fill #5

## 2017-08-01 MED FILL — MYCOPHENOLIC ACID DR 180 MG: 180 | 30 days supply | Qty: 240 | Fill #5

## 2017-08-05 MED FILL — TACROLIMUS 0.5 MG CAPSULE: 0.5 | 30 days supply | Qty: 30 | Fill #1

## 2017-08-08 MED FILL — FENOFIBRATE 134 MG CAPSULE: 134 | 30 days supply | Qty: 30 | Fill #5

## 2017-08-08 MED FILL — OMEPRAZOLE 20 MG CAP: 20 | 30 days supply | Qty: 30 | Fill #5

## 2017-08-08 MED FILL — ALLOPURINOL 100 MG TABLET: 100 | 30 days supply | Qty: 60 | Fill #4

## 2017-08-11 ENCOUNTER — Encounter: Payer: Self-pay | Admitting: Internal Medicine

## 2017-08-11 ENCOUNTER — Ambulatory Visit (INDEPENDENT_AMBULATORY_CARE_PROVIDER_SITE_OTHER): Payer: 59 | Admitting: Internal Medicine

## 2017-08-11 VITALS — BP 136/80 | HR 60 | Ht 68.0 in | Wt 191.2 lb

## 2017-08-11 DIAGNOSIS — I481 Persistent atrial fibrillation: Secondary | ICD-10-CM

## 2017-08-11 DIAGNOSIS — I422 Other hypertrophic cardiomyopathy: Secondary | ICD-10-CM | POA: Diagnosis not present

## 2017-08-11 DIAGNOSIS — I4892 Unspecified atrial flutter: Secondary | ICD-10-CM

## 2017-08-11 DIAGNOSIS — I4819 Other persistent atrial fibrillation: Secondary | ICD-10-CM

## 2017-08-11 DIAGNOSIS — I25700 Atherosclerosis of coronary artery bypass graft(s), unspecified, with unstable angina pectoris: Secondary | ICD-10-CM

## 2017-08-11 NOTE — Progress Notes (Signed)
Electrophysiology Office Note   Date:  08/11/2017   ID:  [name], DOB 10-26-1961, MRN [id number]  PCP:  Hulan Fess, MD    Primary Electrophysiologist:  Dr Caryl Comes  Chief Complaint  Patient presents with  . Atrial Fibrillation     History of Present Illness: [name] is a 56 y.o. male who presents today for electrophysiology evaluation.   He presents for further evaluation of atrial arrhythmias.   He reports in 2013 having typical atrial flutter with post termination pauses.  He was evaluated by Dr Caryl Comes and underwent CTI ablation at that time.  He did well for several years.  Since that time he has had afib and atrial flutter.  He presented 7/18 and required cardioversion for atrial flutter.  His atrial flutter is not completely typical appearing but also not clearly atypical.  He reports fatigue and decreased exercise tolerance during his atrial arrhythmias.   He has previously tried amiodarone in 2013 but could not tolerate this medicine due to bradycardia.  He is s/p renal transplant and with CRI does not have many AAD options.  He also has HCM with severe LA enlargement.   Today, he denies symptoms of palpitations, chest pain, shortness of breath, orthopnea, PND, lower extremity edema, claudication, dizziness, presyncope, syncope, bleeding, or neurologic sequela. The patient is tolerating medications without difficulties and is otherwise without complaint today.    Past Medical History:  Diagnosis Date  . CAD (coronary artery disease)    a. s/p CABG 03/2012: LIMA to LAD, free RIMA to OM1, SVG to D1, sequential SVG to AM and LPLB2, EVH via right thigh and leg.  . Cellulitis 04/13/2012   RLE saphenous vein harvest incision   . Gout   . Hyperlipidemia   . Hyperparathyroidism   . Hypertension   . Hypertrophic cardiomyopathy ([place])   . Migraines   . PAF and Flutter    a. Afib post-op CABG; AFlutter 10/2012  . Peripheral vascular disease ([place])   . Sinus node  dysfunction-post termination pause    a. >8sec in setting of aflutter 10/2012  . Stroke [facility]) 1995; 2001   denies residual  . Superficial vein thrombosis    a. R greater saphenous 04/2012   Past Surgical History:  Procedure Laterality Date  . ATRIAL FLUTTER ABLATION N/A 11/09/2012   Procedure: ATRIAL FLUTTER ABLATION;  Surgeon: [name], MD;  Location: [facility] CATH LAB;  Service: Cardiovascular;  Laterality: N/A;  . AV FISTULA PLACEMENT  2011   left forearm  . AV FISTULA PLACEMENT    . CARDIAC CATHETERIZATION  03/16/12  . CARDIOVERSION N/A 06/10/2017   Procedure: CARDIOVERSION;  Surgeon: Pixie Casino, MD;  Location: [facility] ENDOSCOPY;  Service: Cardiovascular;  Laterality: N/A;  . CORONARY ARTERY BYPASS GRAFT  03/19/2012   Procedure: CORONARY ARTERY BYPASS GRAFTING (CABG);  Surgeon: Rexene Alberts, MD;  Location: [place];  Service: Open Heart Surgery;  Laterality: N/A;  (B) MAMMARY  . CYSTOSCOPY WITH BIOPSY N/A 08/15/2016   Procedure: CYSTOSCOPY WITH IDENTIFICATION OF RIGHT TRANSPLANTATION OF URETRAL ORFICE WITH FLUORESCEIN;  Surgeon: Carolan Clines, MD;  [facility];  Service: Urology;  Laterality: N/A;  . DENTAL SURGERY  2008   multiple  . [place]   left  . FRACTURE SURGERY    . HERNIA REPAIR  16/0737   umbilical  . HOLMIUM LASER APPLICATION N/A 12/07/2692   Procedure: HOLMIUM LASER APPLICATION WITH 3 CM RIGHT BLADDER STONE;  Surgeon:  Carolan Clines, MD;  [facility];  Service: Urology;  Laterality: N/A;  . INGUINAL HERNIA REPAIR  09/2009   bilaterally  . KIDNEY TRANSPLANT    . LEFT HEART CATHETERIZATION WITH CORONARY ANGIOGRAM N/A 03/16/2012   Procedure: LEFT HEART CATHETERIZATION WITH CORONARY ANGIOGRAM;  Surgeon: Sherren Mocha, MD;  Location: [facility] CATH LAB;  Service: Cardiovascular;  Laterality: N/A;  . RENAL ARTERY STENT  2001  . TRANSURETHRAL RESECTION OF BLADDER TUMOR N/A 08/15/2016   Procedure: TRANSURETHRAL BIOPSY OF RIGHT BLADDER WALL;   Surgeon: Carolan Clines, MD;  [facility];  Service: Urology;  Laterality: N/A;     Current Outpatient Prescriptions  Medication Sig Dispense Refill  . allopurinol (ZYLOPRIM) 100 MG tablet Take 200 mg by mouth daily.     Marland Kitchen apixaban (ELIQUIS) 5 MG TABS tablet Take 1 tablet (5 mg total) by mouth 2 (two) times daily. 60 tablet 5  . cloNIDine (CATAPRES) 0.2 MG tablet Take 0.2 mg by mouth at bedtime.     . fenofibrate micronized (LOFIBRA) 134 MG capsule Take 134 mg by mouth daily.     . magnesium oxide (MAG-OX) 400 (241.3 Mg) MG tablet Take 400 mg by mouth 2 (two) times daily.    . metoprolol tartrate (LOPRESSOR) 50 MG tablet Take 1.5 tablet by mouth in the AM and 1 tablet by mouth in the PM    . mycophenolate (MYFORTIC) 180 MG EC tablet Take 720 mg by mouth 2 (two) times daily.     Marland Kitchen omega-3 acid ethyl esters (LOVAZA) 1 G capsule Take 2 g by mouth 2 (two) times daily.    Marland Kitchen omeprazole (PRILOSEC) 20 MG capsule Take 20 mg by mouth daily.  5  . potassium chloride SA (KLOR-CON M15) 15 MEQ tablet Take 30 mEq by mouth 2 (two) times daily.    . predniSONE (DELTASONE) 5 MG tablet Take 5 mg by mouth at bedtime.    . simvastatin (ZOCOR) 40 MG tablet Take 40 mg by mouth See admin instructions. Pt takes one tablet every day except Friday and Saturday.    . sulfamethoxazole-trimethoprim (BACTRIM,SEPTRA) 400-80 MG tablet Take 1 tablet by mouth every Monday, Wednesday, and Friday.  6  . tacrolimus (PROGRAF) 0.5 MG capsule Take 0.5 mg by mouth every evening.  2  . tacrolimus (PROGRAF) 1 MG capsule Take 1 mg by mouth every morning.   5  . Vitamin D, Ergocalciferol, (DRISDOL) 50000 units CAPS capsule Take 50,000 Units by mouth every 7 (seven) days. Pt takes on Tuesday  5   No current facility-administered medications for this visit.     Allergies:   Contrast media [iodinated diagnostic agents]; Penicillins; and Lisinopril   Social History:  The patient  reports that he quit smoking about 5 years ago.  His smoking use included Cigarettes. He has a 9.00 pack-year smoking history. He has never used smokeless tobacco. He reports that he drinks alcohol. He reports that he does not use drugs.   Family History:  The patient's  family history includes Hypertension in his other. He was adopted.    ROS:  Please see the history of present illness.   All other systems are personally reviewed and negative.    PHYSICAL EXAM: VS:  BP 136/80   Pulse 60   Ht 5\' 8"  (1.727 m)   Wt 191 lb 3.2 oz (86.7 kg)   SpO2 98%   BMI 29.07 kg/m  , BMI Body mass index is 29.07 kg/m. GEN: Well nourished, well developed,  in no acute distress  HEENT: normal  Neck: no JVD, carotid bruits, or masses Cardiac: RRR; no murmurs, rubs, or gallops,no edema  Respiratory:  clear to auscultation bilaterally, normal work of breathing GI: soft, nontender, nondistended, + BS MS: no deformity or atrophy  Skin: warm and dry  Neuro:  Strength and sensation are intact Psych: euthymic mood, full affect  EKG:  EKG is ordered today. The ekg ordered today is personally reviewed and shows sinus rhythm with frequent PACs, LVH, repolarization abnormality, LAHB   Recent Labs: 06/08/2017: ALT 38; TSH 0.697 06/14/2017: BUN 29; Creatinine, Ser 1.69; Hemoglobin 17.7; Magnesium 1.7; Platelets 186; Potassium 4.5; Sodium 136  personally reviewed   Lipid Panel  No results found for: CHOL, TRIG, HDL, CHOLHDL, VLDL, LDLCALC, LDLDIRECT personally reviewed   Wt Readings from Last 3 Encounters:  08/11/17 191 lb 3.2 oz (86.7 kg)  06/11/17 195 lb 15.8 oz (88.9 kg)  03/26/17 190 lb (86.2 kg)      Other studies personally reviewed: Additional studies/ records that were reviewed today include: Dr Aquilla Hacker notes, prior ekgs, prior echo  Review of the above records today demonstrates: as above   ASSESSMENT AND PLAN:  1.  Atrial arrhythmias The patient has a h/o paroxysmal atrial fibrillation.  He also has atrial flutter.  Though his atrial  flutter may be isthmus dependant, it is not completely typical.  He has had prior CTI ablation by Dr Caryl Comes in 2013. Therapeutic strategies for afib/ atrial flutter including medicine and ablation were discussed in detail with the patient today.  Given HCM and renal insufficiency, our medicine options are limited. Risk, benefits, and alternatives to EP study and radiofrequency ablation were also discussed in detail today. These risks include but are not limited to stroke, bleeding, vascular damage, tamponade, perforation, damage to the esophagus, lungs, and other structures, pulmonary vein stenosis, worsening renal function, and death. The patient understands these risk and wishes to proceed.  We will therefore proceed with catheter ablation at the next available time.  Continue on eliquis. TEE is planned prior to ablation  2. HCM Stable Given severe LA enlargement, I worry about further arrhythmias in the future  3. S/p renal transplant Creatinine 1.5-1.6 at baseline Will avoid contrast with ablation as able  4. CAD No ischemic symptoms No changes  5. HTN Stable No change required today    Current medicines are reviewed at length with the patient today.   The patient does not have concerns regarding his medicines.  The following changes were made today:  none   Signed, [name], MD  08/11/2017 3:50 PM     [address] [place] [address] [phone] (office) [phone] (fax)

## 2017-08-11 NOTE — Patient Instructions (Addendum)
Medication Instructions:  Your physician recommends that you continue on your current medications as directed. Please refer to the Current Medication list given to you today.   Labwork: Your physician recommends that you return for lab work:today  BMP/CBC   Testing/Procedures: Your physician has requested that you have a TEE. During a TEE, sound waves are used to create images of your heart. It provides your doctor with information about the size and shape of your heart and how well your heart's chambers and valves are working. In this test, a transducer is attached to the end of a flexible tube that's guided down your throat and into your esophagus (the tube leading from you mouth to your stomach) to get a more detailed image of your heart. You are not awake for the procedure. Please see the instruction sheet given to you today. For further information please visit [email or web address]   Your physician has recommended that you have an ablation. Catheter ablation is a medical procedure used to treat some cardiac arrhythmias (irregular heartbeats). During catheter ablation, a long, thin, flexible tube is put into a blood vessel in your groin (upper thigh), or neck. This tube is called an ablation catheter. It is then guided to your heart through the blood vessel. Radio frequency waves destroy small areas of heart tissue where abnormal heartbeats may cause an arrhythmia to start. Please see the instruction sheet given to you today.---08/21/17  Please arrive at [facility] at 7:30am Do not eat or drink after midnight the night prior to the procedure Do not take any medications the morning of the test Plan for one night stay Will need someone to drive you home at discharge     Follow-Up:  Your physician recommends that you schedule a follow-up appointment in: 4 weeks from 08/21/17 with Roderic Palau, NP and 3 months from 08/21/17 with Dr  Rayann Heman

## 2017-08-12 LAB — CBC WITH DIFFERENTIAL/PLATELET
Basophils Absolute: 0 10*3/uL (ref 0.0–0.2)
Basos: 0 %
EOS (ABSOLUTE): 0.1 10*3/uL (ref 0.0–0.4)
EOS: 1 %
Hematocrit: 54.2 % — ABNORMAL HIGH (ref 37.5–51.0)
Hemoglobin: 17.9 g/dL — ABNORMAL HIGH (ref 13.0–17.7)
Immature Grans (Abs): 0.1 10*3/uL (ref 0.0–0.1)
Immature Granulocytes: 1 %
LYMPHS ABS: 1.4 10*3/uL (ref 0.7–3.1)
Lymphs: 18 %
MCH: 27.1 pg (ref 26.6–33.0)
MCHC: 33 g/dL (ref 31.5–35.7)
MCV: 82 fL (ref 79–97)
MONOCYTES: 11 %
Monocytes Absolute: 0.8 10*3/uL (ref 0.1–0.9)
NEUTROS PCT: 69 %
Neutrophils Absolute: 5.3 10*3/uL (ref 1.4–7.0)
Platelets: 170 10*3/uL (ref 150–379)
RBC: 6.6 x10E6/uL — ABNORMAL HIGH (ref 4.14–5.80)
RDW: 16.1 % — AB (ref 12.3–15.4)
WBC: 7.8 10*3/uL (ref 3.4–10.8)

## 2017-08-12 LAB — BASIC METABOLIC PANEL
BUN/Creatinine Ratio: 16 (ref 9–20)
BUN: 25 mg/dL — AB (ref 6–24)
CO2: 16 mmol/L — ABNORMAL LOW (ref 20–29)
Calcium: 9.7 mg/dL (ref 8.7–10.2)
Chloride: 104 mmol/L (ref 96–106)
Creatinine, Ser: 1.53 mg/dL — ABNORMAL HIGH (ref 0.76–1.27)
GFR, EST AFRICAN AMERICAN: 59 mL/min/{1.73_m2} — AB (ref >59)
GFR, EST NON AFRICAN AMERICAN: 51 mL/min/{1.73_m2} — AB (ref >59)
Glucose: 106 mg/dL — ABNORMAL HIGH (ref 65–99)
POTASSIUM: 5.2 mmol/L (ref 3.5–5.2)
Sodium: 136 mmol/L (ref 134–144)

## 2017-08-14 DIAGNOSIS — Z940 Kidney transplant status: Secondary | ICD-10-CM | POA: Diagnosis not present

## 2017-08-14 DIAGNOSIS — R739 Hyperglycemia, unspecified: Secondary | ICD-10-CM | POA: Diagnosis not present

## 2017-08-14 DIAGNOSIS — M109 Gout, unspecified: Secondary | ICD-10-CM | POA: Diagnosis not present

## 2017-08-21 ENCOUNTER — Ambulatory Visit (HOSPITAL_COMMUNITY): Payer: 59 | Admitting: Anesthesiology

## 2017-08-21 ENCOUNTER — Ambulatory Visit (HOSPITAL_COMMUNITY)
Admission: RE | Admit: 2017-08-21 | Discharge: 2017-08-22 | Disposition: A | Discharge status: Home / Self Care | Payer: 59 | Source: Ambulatory Visit | Attending: Internal Medicine | Admitting: Internal Medicine

## 2017-08-21 ENCOUNTER — Ambulatory Visit (HOSPITAL_BASED_OUTPATIENT_CLINIC_OR_DEPARTMENT_OTHER)
Admission: RE | Admit: 2017-08-21 | Discharge: 2017-08-21 | Disposition: A | Discharge status: Home / Self Care | Payer: 59 | Source: Ambulatory Visit | Attending: Nurse Practitioner | Admitting: Nurse Practitioner

## 2017-08-21 ENCOUNTER — Encounter (HOSPITAL_COMMUNITY)
Admission: RE | Disposition: A | Discharge status: Home / Self Care | Payer: Self-pay | Source: Ambulatory Visit | Attending: Internal Medicine

## 2017-08-21 ENCOUNTER — Encounter (HOSPITAL_COMMUNITY): Payer: Self-pay | Admitting: *Deleted

## 2017-08-21 DIAGNOSIS — Z951 Presence of aortocoronary bypass graft: Secondary | ICD-10-CM | POA: Diagnosis not present

## 2017-08-21 DIAGNOSIS — I480 Paroxysmal atrial fibrillation: Secondary | ICD-10-CM | POA: Insufficient documentation

## 2017-08-21 DIAGNOSIS — Z7901 Long term (current) use of anticoagulants: Secondary | ICD-10-CM | POA: Insufficient documentation

## 2017-08-21 DIAGNOSIS — I739 Peripheral vascular disease, unspecified: Secondary | ICD-10-CM | POA: Insufficient documentation

## 2017-08-21 DIAGNOSIS — E785 Hyperlipidemia, unspecified: Secondary | ICD-10-CM | POA: Insufficient documentation

## 2017-08-21 DIAGNOSIS — Z8673 Personal history of transient ischemic attack (TIA), and cerebral infarction without residual deficits: Secondary | ICD-10-CM | POA: Diagnosis not present

## 2017-08-21 DIAGNOSIS — Z880 Allergy status to penicillin: Secondary | ICD-10-CM | POA: Insufficient documentation

## 2017-08-21 DIAGNOSIS — Z79899 Other long term (current) drug therapy: Secondary | ICD-10-CM | POA: Insufficient documentation

## 2017-08-21 DIAGNOSIS — I422 Other hypertrophic cardiomyopathy: Secondary | ICD-10-CM | POA: Insufficient documentation

## 2017-08-21 DIAGNOSIS — I639 Cerebral infarction, unspecified: Secondary | ICD-10-CM | POA: Diagnosis not present

## 2017-08-21 DIAGNOSIS — I4891 Unspecified atrial fibrillation: Secondary | ICD-10-CM

## 2017-08-21 DIAGNOSIS — I484 Atypical atrial flutter: Secondary | ICD-10-CM | POA: Insufficient documentation

## 2017-08-21 DIAGNOSIS — Z87891 Personal history of nicotine dependence: Secondary | ICD-10-CM | POA: Insufficient documentation

## 2017-08-21 DIAGNOSIS — N189 Chronic kidney disease, unspecified: Secondary | ICD-10-CM | POA: Diagnosis not present

## 2017-08-21 DIAGNOSIS — Z940 Kidney transplant status: Secondary | ICD-10-CM | POA: Diagnosis not present

## 2017-08-21 DIAGNOSIS — I2510 Atherosclerotic heart disease of native coronary artery without angina pectoris: Secondary | ICD-10-CM | POA: Diagnosis not present

## 2017-08-21 DIAGNOSIS — I129 Hypertensive chronic kidney disease with stage 1 through stage 4 chronic kidney disease, or unspecified chronic kidney disease: Secondary | ICD-10-CM | POA: Diagnosis not present

## 2017-08-21 DIAGNOSIS — I4892 Unspecified atrial flutter: Secondary | ICD-10-CM | POA: Diagnosis not present

## 2017-08-21 DIAGNOSIS — I25810 Atherosclerosis of coronary artery bypass graft(s) without angina pectoris: Secondary | ICD-10-CM | POA: Diagnosis not present

## 2017-08-21 HISTORY — DX: Chronic kidney disease, unspecified: N18.9

## 2017-08-21 HISTORY — PX: TEE WITHOUT CARDIOVERSION: SHX5443

## 2017-08-21 HISTORY — DX: Kidney transplant status: Z94.0

## 2017-08-21 HISTORY — PX: ATRIAL FIBRILLATION ABLATION: EP1191

## 2017-08-21 LAB — POCT ACTIVATED CLOTTING TIME
ACTIVATED CLOTTING TIME: 290 s
Activated Clotting Time: 318 seconds
Activated Clotting Time: 345 seconds
Activated Clotting Time: 175 seconds

## 2017-08-21 SURGERY — ATRIAL FIBRILLATION ABLATION
Anesthesia: Monitor Anesthesia Care

## 2017-08-21 SURGERY — ECHOCARDIOGRAM, TRANSESOPHAGEAL
Anesthesia: Moderate Sedation

## 2017-08-21 MED ORDER — POTASSIUM CHLORIDE CRYS ER 10 MEQ PO TBCR: 15.0000 meq | EXTENDED_RELEASE_TABLET | Freq: Two times a day (BID) | ORAL | Status: DC

## 2017-08-21 MED ORDER — HEPARIN SODIUM (PORCINE) 1000 UNIT/ML IJ SOLN
INTRAMUSCULAR | Status: AC
  Filled 2017-08-21: qty 1

## 2017-08-21 MED ORDER — ISOPROTERENOL HCL 0.2 MG/ML IJ SOLN
INTRAVENOUS | Status: DC | PRN
  Administered 2017-08-21: 10 ug/min via INTRAVENOUS

## 2017-08-21 MED ORDER — LIDOCAINE HCL (PF) 1 % IJ SOLN
INTRAMUSCULAR | Status: AC
  Filled 2017-08-21: qty 30

## 2017-08-21 MED ORDER — SODIUM CHLORIDE 0.9% FLUSH
3.0000 mL | Freq: Two times a day (BID) | INTRAVENOUS | Status: DC
  Administered 2017-08-22: 3 mL via INTRAVENOUS

## 2017-08-21 MED ORDER — ACETAMINOPHEN 325 MG PO TABS: 650.0000 mg | ORAL_TABLET | ORAL | Status: DC | PRN

## 2017-08-21 MED ORDER — MAGNESIUM OXIDE 400 (241.3 MG) MG PO TABS
400.0000 mg | ORAL_TABLET | Freq: Two times a day (BID) | ORAL | Status: DC
  Administered 2017-08-21 – 2017-08-22 (×2): 400 mg via ORAL
  Filled 2017-08-21 (×2): qty 1

## 2017-08-21 MED ORDER — APIXABAN 5 MG PO TABS
5.0000 mg | ORAL_TABLET | Freq: Two times a day (BID) | ORAL | Status: DC
  Administered 2017-08-21 – 2017-08-22 (×2): 5 mg via ORAL
  Filled 2017-08-21 (×2): qty 1

## 2017-08-21 MED ORDER — PROTAMINE SULFATE 10 MG/ML IV SOLN
INTRAVENOUS | Status: DC | PRN
  Administered 2017-08-21: 29 mg via INTRAVENOUS
  Administered 2017-08-21: 1 mg via INTRAVENOUS

## 2017-08-21 MED ORDER — ALLOPURINOL 100 MG PO TABS
200.0000 mg | ORAL_TABLET | Freq: Every day | ORAL | Status: DC
  Administered 2017-08-21 – 2017-08-22 (×2): 200 mg via ORAL
  Filled 2017-08-21 (×2): qty 2

## 2017-08-21 MED ORDER — PROPOFOL 10 MG/ML IV BOLUS
INTRAVENOUS | Status: DC | PRN
  Administered 2017-08-21 (×3): 20 mg via INTRAVENOUS

## 2017-08-21 MED ORDER — HEPARIN SODIUM (PORCINE) 1000 UNIT/ML IJ SOLN
INTRAMUSCULAR | Status: DC | PRN
  Administered 2017-08-21: 12000 [IU] via INTRAVENOUS
  Administered 2017-08-21 (×2): 1000 [IU] via INTRAVENOUS
  Administered 2017-08-21: 3000 [IU] via INTRAVENOUS

## 2017-08-21 MED ORDER — PROPOFOL 500 MG/50ML IV EMUL
INTRAVENOUS | Status: DC | PRN
  Administered 2017-08-21: 12:00:00 via INTRAVENOUS
  Administered 2017-08-21: 50 ug/kg/min via INTRAVENOUS

## 2017-08-21 MED ORDER — SIMVASTATIN 40 MG PO TABS
40.0000 mg | ORAL_TABLET | ORAL | Status: DC
  Administered 2017-08-22: 40 mg via ORAL
  Filled 2017-08-21: qty 1

## 2017-08-21 MED ORDER — FENTANYL CITRATE (PF) 100 MCG/2ML IJ SOLN
INTRAMUSCULAR | Status: AC
  Filled 2017-08-21: qty 2

## 2017-08-21 MED ORDER — MIDAZOLAM HCL 5 MG/ML IJ SOLN
INTRAMUSCULAR | Status: AC
  Filled 2017-08-21: qty 2

## 2017-08-21 MED ORDER — ONDANSETRON HCL 4 MG/2ML IJ SOLN: 4.0000 mg | Freq: Four times a day (QID) | INTRAMUSCULAR | Status: DC | PRN

## 2017-08-21 MED ORDER — TACROLIMUS 1 MG PO CAPS
1.0000 mg | ORAL_CAPSULE | Freq: Every morning | ORAL | Status: DC
  Administered 2017-08-21 – 2017-08-22 (×2): 1 mg via ORAL
  Filled 2017-08-21 (×2): qty 1

## 2017-08-21 MED ORDER — MIDAZOLAM HCL 10 MG/2ML IJ SOLN
INTRAMUSCULAR | Status: DC | PRN
  Administered 2017-08-21 (×2): 2 mg via INTRAVENOUS
  Administered 2017-08-21: 1 mg via INTRAVENOUS

## 2017-08-21 MED ORDER — SODIUM CHLORIDE 0.9 % IV SOLN: 250.0000 mL | INTRAVENOUS | Status: DC | PRN

## 2017-08-21 MED ORDER — SODIUM CHLORIDE 0.9% FLUSH: 3.0000 mL | INTRAVENOUS | Status: DC | PRN

## 2017-08-21 MED ORDER — PANTOPRAZOLE SODIUM 40 MG PO TBEC
40.0000 mg | DELAYED_RELEASE_TABLET | Freq: Every day | ORAL | Status: DC
  Administered 2017-08-21 – 2017-08-22 (×2): 40 mg via ORAL
  Filled 2017-08-21: qty 1

## 2017-08-21 MED ORDER — SODIUM CHLORIDE 0.9 % IV SOLN: INTRAVENOUS | Status: DC

## 2017-08-21 MED ORDER — TACROLIMUS 1 MG PO CAPS
1.0000 mg | ORAL_CAPSULE | Freq: Every morning | ORAL | Status: DC
  Filled 2017-08-21: qty 1

## 2017-08-21 MED ORDER — SODIUM CHLORIDE 0.9 % IV SOLN
INTRAVENOUS | Status: DC | PRN
  Administered 2017-08-21 (×2): via INTRAVENOUS

## 2017-08-21 MED ORDER — HEPARIN SODIUM (PORCINE) 1000 UNIT/ML IJ SOLN
INTRAMUSCULAR | Status: DC | PRN
  Administered 2017-08-21: 12000 [IU] via INTRAVENOUS
  Administered 2017-08-21: 1000 [IU] via INTRAVENOUS

## 2017-08-21 MED ORDER — MYCOPHENOLATE SODIUM 180 MG PO TBEC
720.0000 mg | DELAYED_RELEASE_TABLET | Freq: Two times a day (BID) | ORAL | Status: DC
  Administered 2017-08-22 (×2): 720 mg via ORAL
  Filled 2017-08-21 (×2): qty 4

## 2017-08-21 MED ORDER — POTASSIUM CHLORIDE 20 MEQ/15ML (10%) PO SOLN
15.0000 meq | Freq: Two times a day (BID) | ORAL | Status: DC
  Administered 2017-08-21: 15 meq via ORAL
  Filled 2017-08-21 (×2): qty 15

## 2017-08-21 MED ORDER — TACROLIMUS 0.5 MG PO CAPS
0.5000 mg | ORAL_CAPSULE | Freq: Every evening | ORAL | Status: DC
  Administered 2017-08-22: 0.5 mg via ORAL
  Filled 2017-08-21: qty 1

## 2017-08-21 MED ORDER — PHENYLEPHRINE HCL 10 MG/ML IJ SOLN
INTRAMUSCULAR | Status: DC | PRN
  Administered 2017-08-21: 20 ug/min via INTRAVENOUS

## 2017-08-21 MED ORDER — MYCOPHENOLATE SODIUM 180 MG PO TBEC
720.0000 mg | DELAYED_RELEASE_TABLET | Freq: Two times a day (BID) | ORAL | Status: DC
  Administered 2017-08-21: 720 mg via ORAL
  Filled 2017-08-21: qty 4

## 2017-08-21 MED ORDER — BUTAMBEN-TETRACAINE-BENZOCAINE 2-2-14 % EX AERO
INHALATION_SPRAY | CUTANEOUS | Status: DC | PRN
  Administered 2017-08-21: 2 via TOPICAL

## 2017-08-21 MED ORDER — LIDOCAINE HCL (PF) 1 % IJ SOLN
INTRAMUSCULAR | Status: DC | PRN
  Administered 2017-08-21: 45 mL

## 2017-08-21 MED ORDER — SULFAMETHOXAZOLE-TRIMETHOPRIM 400-80 MG PO TABS
1.0000 | ORAL_TABLET | ORAL | Status: DC
  Administered 2017-08-22: 1 via ORAL
  Filled 2017-08-21: qty 1

## 2017-08-21 MED ORDER — PREDNISONE 5 MG PO TABS
5.0000 mg | ORAL_TABLET | Freq: Every day | ORAL | Status: DC
  Administered 2017-08-21: 5 mg via ORAL
  Filled 2017-08-21: qty 1

## 2017-08-21 MED ORDER — ONDANSETRON HCL 4 MG/2ML IJ SOLN
INTRAMUSCULAR | Status: DC | PRN
  Administered 2017-08-21: 4 mg via INTRAVENOUS

## 2017-08-21 MED ORDER — HYDROCODONE-ACETAMINOPHEN 5-325 MG PO TABS
1.0000 | ORAL_TABLET | ORAL | Status: DC | PRN
  Administered 2017-08-21: 1 via ORAL
  Administered 2017-08-21: 2 via ORAL
  Filled 2017-08-21: qty 1

## 2017-08-21 MED ORDER — FENTANYL CITRATE (PF) 100 MCG/2ML IJ SOLN
INTRAMUSCULAR | Status: DC | PRN
  Administered 2017-08-21: 50 ug via INTRAVENOUS
  Administered 2017-08-21: 25 ug via INTRAVENOUS

## 2017-08-21 MED ORDER — ISOPROTERENOL HCL 0.2 MG/ML IJ SOLN
INTRAMUSCULAR | Status: AC
  Filled 2017-08-21: qty 5

## 2017-08-21 MED ORDER — TACROLIMUS 0.5 MG PO CAPS
0.5000 mg | ORAL_CAPSULE | Freq: Every evening | ORAL | Status: DC
  Filled 2017-08-21: qty 1

## 2017-08-21 MED ORDER — HYDROCODONE-ACETAMINOPHEN 5-325 MG PO TABS
ORAL_TABLET | ORAL | Status: AC
Start: 2017-08-21 — End: 2017-08-21
  Filled 2017-08-21: qty 2

## 2017-08-21 MED ORDER — MIDAZOLAM HCL 2 MG/2ML IJ SOLN
INTRAMUSCULAR | Status: DC | PRN
  Administered 2017-08-21: 2 mg via INTRAVENOUS

## 2017-08-21 SURGICAL SUPPLY — 17 items
BLANKET WARM UNDERBOD FULL ACC (MISCELLANEOUS) ×2 IMPLANT
CATH NAVISTAR SMARTTOUCH DF (ABLATOR) ×1 IMPLANT
CATH SOUNDSTAR ECO REPROCESSED (CATHETERS) ×1 IMPLANT
CATH VARIABLE LASSO NAV 2515 (CATHETERS) ×1 IMPLANT
CATH WEBSTER BI DIR CS D-F CRV (CATHETERS) ×1 IMPLANT
COVER SWIFTLINK CONNECTOR (BAG) ×2 IMPLANT
NDL TRANSEP BRK 71CM 407200 (NEEDLE) IMPLANT
NEEDLE TRANSEP BRK 71CM 407200 (NEEDLE) ×2 IMPLANT
PACK EP LATEX FREE (CUSTOM PROCEDURE TRAY) ×2
PACK EP LF (CUSTOM PROCEDURE TRAY) ×1 IMPLANT
PAD DEFIB LIFELINK (PAD) ×2 IMPLANT
PATCH CARTO3 (PAD) ×1 IMPLANT
SHEATH AVANTI 11F 11CM (SHEATH) ×1 IMPLANT
SHEATH PINNACLE 7F 10CM (SHEATH) ×2 IMPLANT
SHEATH PINNACLE 9F 10CM (SHEATH) ×1 IMPLANT
SHEATH SWARTZ TS SL2 63CM 8.5F (SHEATH) ×1 IMPLANT
TUBING SMART ABLATE COOLFLOW (TUBING) ×2 IMPLANT

## 2017-08-21 NOTE — CV Procedure (Signed)
Procedure: TEE  Indication: atrial fibrillation  Sedation: Versed 5 mg IV, Fentanyl 75 mcg IV  Findings: Please see echo section for full report.  Normal LV size with moderate LV hypertrophy.  Hypertrophy appeared more prominent towards the mid-ventricle and apex, ?form of hypertrophic cardiomyopathy.  EF 60%, no regional wall motion abnormalities noted.  No mitral valve SAM or evidence for LV outflow gradient.  Normal RV size and systolic function.  Mild left atrial enlargement, no LA appendage thrombus.  Normal right atrium.  No PFO or ASD, negative bubble study.  There was mild MR.  Trileaflet aortic valve with mild regurgitation, no stenosis.  No significant TR or PI.  Normal caliber aorta with mild plaque in descending thoracic aorta.   May proceed to atrial fibrillation ablation.   [name] 08/21/2017 9:34 AM

## 2017-08-21 NOTE — Interval H&P Note (Signed)
History and Physical Interval Note:  08/21/2017 8:16 AM  [name] Standard  has presented today for surgery, with the diagnosis of afib  The various methods of treatment have been discussed with the patient and family. After consideration of risks, benefits and other options for treatment, the patient has consented to  Procedure(s): Atrial Fibrillation Ablation (N/A) as a surgical intervention .  The patient's history has been reviewed, patient examined, no change in status, stable for surgery.  I have reviewed the patient's chart and labs.  Questions were answered to the patient's satisfaction.     [name]

## 2017-08-21 NOTE — Progress Notes (Signed)
Site area: rt groin 3 fv sheaths Site Prior to Removal:  Level 0 Pressure Applied For: 20 minutes Manual:   yes Patient Status During Pull:  stable Post Pull Site:  Level 0 Post Pull Instructions Given:  yes Post Pull Pulses Present: palpable Dressing Applied:  Gauze and tegaderm Bedrest begins @  7711 Comments: IV saline locked

## 2017-08-21 NOTE — Progress Notes (Signed)
OOB in hallway with walker and wife at bedside. Pt reminded that he is to be on bedrest until 2020. Verbalized understanding. Rt groin site benign s/s bleed or complications at site. Wife into room at bedside.

## 2017-08-21 NOTE — Progress Notes (Signed)
  Echocardiogram Echocardiogram Transesophageal has been performed.  [name] 08/21/2017, 9:54 AM

## 2017-08-21 NOTE — Discharge Summary (Signed)
ELECTROPHYSIOLOGY PROCEDURE DISCHARGE SUMMARY    Patient ID: [name],  MRN: [id number], DOB/AGE: 54-Sep-1962 54 y.o.  Admit date: 08/21/2017 Discharge date: 08/22/2017  Primary Care Physician: Hulan Fess, MD Electrophysiologist: Thomas Hoff, MD  Primary Discharge Diagnosis:  Paroxysmal atrial fibrillation and atrial flutter status post ablation this admission  Secondary Discharge Diagnosis:  1.  HCM 2.  S/p renal transplant 3.  CAD 4.  HTN 5.  Prior CVA  Procedures This Admission:  1.  Electrophysiology study and radiofrequency catheter ablation on 08/21/17 by Dr [name].  This study demonstrated sinus rhythm upon presentation; intracardiac echo reveals a moderately enlarged left atrium with four separate pulmonary veins without evidence of pulmonary vein stenosis; successful electrical isolation and anatomical encircling of all four pulmonary veins with radiofrequency current; atypical atrial flutter not inducible today and therefore not amenable to mapping or ablation; complete bidirectionaly isthmus block from prior CTI ablation confirmed; very frequent and multifocal PACs suggest non PV source for afib.  Despite extensive mapping, there were no dominant PACs that could be ablated today.  I am doubtful that additional ablation would be beneficial in the future; no early apparent complications..    Brief HPI: [name] is a 56 y.o. male with a history of paroxysmal atrial fibrillation and atrial flutter.  AAD therapy is limited 2/2 prior renal transplant. Risks, benefits, and alternatives to catheter ablation of atrial fibrillation were reviewed with the patient who wished to proceed.  The patient underwent TEE prior to the procedure which demonstrated normal LV function and no LAA thrombus.    Hospital Course:  The patient was admitted and underwent EPS/RFCA of atrial fibrillation with details as outlined above.  They were monitored on telemetry overnight  which demonstrated sinus rhythm with PAC's.  Groin was without complication on the day of discharge.  The patient was examined and considered to be stable for discharge.  Wound care and restrictions were reviewed with the patient.  The patient will be seen back by Roderic Palau, NP in 4 weeks and Dr Rayann Heman in 12 weeks for post ablation follow up.   This patients CHA2DS2-VASc Score and unadjusted Ischemic Stroke Rate (% per year) is equal to 3.2 % stroke rate/year from a score of 3 Above score calculated as 1 point each if present [CHF, HTN, DM, Vascular=MI/PAD/Aortic Plaque, Age if 65-74, or Male] Above score calculated as 2 points each if present [Age > 75, or Stroke/TIA/TE]   Physical Exam: Vitals:   08/21/17 1800 08/21/17 1900 08/21/17 2004 08/22/17 0549  BP: 109/80 101/72 107/70 (!) 147/87  Pulse: (!) 51 (!) 43 (!) 53 (!) 53  Resp:   16 17  Temp:   (!) 97.5 F (36.4 C) 97.6 F (36.4 C)  TempSrc:   Oral Oral  SpO2: 97% 98% 97% 97%  Weight:    196 lb 6.4 oz (89.1 kg)  Height:        GEN- The patient is well appearing, alert and oriented x 3 today.   HEENT: normocephalic, atraumatic; sclera clear, conjunctiva pink; hearing intact; oropharynx clear; neck supple  Lungs- Clear to ausculation bilaterally, normal work of breathing.  No wheezes, rales, rhonchi Heart- Regular rate and rhythm, no murmurs, rubs or gallops  GI- soft, non-tender, non-distended, bowel sounds present  Extremities- no clubbing, cyanosis, or edema; DP/PT/radial pulses 2+ bilaterally, groin without hematoma/bruit MS- no significant deformity or atrophy Skin- warm and dry, no rash or lesion Psych- euthymic mood, full affect Neuro-  strength and sensation are intact   Labs:   Lab Results  Component Value Date   WBC 7.8 08/11/2017   HGB 17.9 (H) 08/11/2017   HCT 54.2 (H) 08/11/2017   MCV 82 08/11/2017   PLT 170 08/11/2017     Recent Labs Lab 08/22/17 0320  NA 137  K 4.0  CL 107  CO2 22  BUN 19    CREATININE 1.51*  CALCIUM 8.7*  GLUCOSE 122*     Discharge Medications:  Allergies as of 08/22/2017      Reactions   Contrast Media [iodinated Diagnostic Agents] Other (See Comments)   Pt states that this medication causes him to code.    Penicillins Itching, Rash, Other (See Comments)   Has patient had a PCN reaction causing immediate rash, facial/tongue/throat swelling, SOB or lightheadedness with hypotension: No Has patient had a PCN reaction causing severe rash involving mucus membranes or skin necrosis: No Has patient had a PCN reaction that required hospitalization No Has patient had a PCN reaction occurring within the last 10 years: No If all of the above answers are "NO", then may proceed with Cephalosporin use.   Lisinopril    Increased Cr      Medication List    TAKE these medications   allopurinol 100 MG tablet Commonly known as:  ZYLOPRIM Take 200 mg by mouth daily.   apixaban 5 MG Tabs tablet Commonly known as:  ELIQUIS Take 1 tablet (5 mg total) by mouth 2 (two) times daily.   cloNIDine 0.2 MG tablet Commonly known as:  CATAPRES Take 0.2 mg by mouth at bedtime.   fenofibrate micronized 134 MG capsule Commonly known as:  LOFIBRA Take 134 mg by mouth daily.   LOVAZA 1 g capsule Generic drug:  omega-3 acid ethyl esters Take 2 g by mouth 2 (two) times daily.   magnesium oxide 400 (241.3 Mg) MG tablet Commonly known as:  MAG-OX Take 400 mg by mouth 2 (two) times daily.   metoprolol tartrate 50 MG tablet Commonly known as:  LOPRESSOR Take 25-50 mg by mouth See admin instructions. Take 1 tabl (50mg ) in the morning, 1/2 tab (25mg ) in the afternoon, and 1 tab (50mg ) in the evening   mycophenolate 180 MG EC tablet Commonly known as:  MYFORTIC Take 720 mg by mouth 2 (two) times daily.   omeprazole 20 MG capsule Commonly known as:  PRILOSEC Take 20 mg by mouth daily.   potassium chloride SA 15 MEQ tablet Commonly known as:  KLOR-CON M15 Take 15 mEq by  mouth 2 (two) times daily.   predniSONE 5 MG tablet Commonly known as:  DELTASONE Take 5 mg by mouth at bedtime.   simvastatin 40 MG tablet Commonly known as:  ZOCOR Take 40 mg by mouth See admin instructions. Pt takes one tablet every day except Friday and Saturday.   sulfamethoxazole-trimethoprim 400-80 MG tablet Commonly known as:  BACTRIM,SEPTRA Take 1 tablet by mouth every Monday, Wednesday, and Friday.   tacrolimus 1 MG capsule Commonly known as:  PROGRAF Take 1 mg by mouth every morning.   tacrolimus 0.5 MG capsule Commonly known as:  PROGRAF Take 0.5 mg by mouth every evening.   Vitamin D (Ergocalciferol) 50000 units Caps capsule Commonly known as:  DRISDOL Take 50,000 Units by mouth every Tuesday.            Discharge Care Instructions        Start     Ordered   08/22/17 0000  Increase  activity slowly     08/22/17 0758   08/22/17 0000  Diet - low sodium heart healthy     08/22/17 0758      Disposition:  Discharge Instructions    Diet - low sodium heart healthy    Complete by:  As directed    Increase activity slowly    Complete by:  As directed      Follow-up Information    [facility] Follow up on 09/25/2017.   Specialty:  Cardiology Why:  at [facility] information: [address] [id number] Danice Goltz [place] [address] [phone]       [name], MD Follow up on 11/19/2017.   Specialty:  Cardiology Why:  at [facility] information: [place] [address] [phone]           Duration of Discharge Encounter: Greater than 30 minutes including physician time.  Signed, Chanetta Marshall, NP 08/22/2017 7:58 AM  I have seen, examined the patient, and reviewed the above assessment and plan.  On exam, RRR.  Changes to above are made where necessary.  DC to home with routine post procedure care.  Co Sign: [name], MD 08/22/2017 8:44 AM

## 2017-08-21 NOTE — Anesthesia Procedure Notes (Signed)
Procedure Name: MAC Date/Time: 08/21/2017 10:32 AM Performed by: Mervyn Gay Pre-anesthesia Checklist: Patient identified, Patient being monitored, Timeout performed, Emergency Drugs available and Suction available Patient Re-evaluated:Patient Re-evaluated prior to induction Oxygen Delivery Method: Simple face mask Number of attempts: 1 Placement Confirmation: positive ETCO2 Dental Injury: Teeth and Oropharynx as per pre-operative assessment

## 2017-08-21 NOTE — Anesthesia Preprocedure Evaluation (Signed)
Anesthesia Evaluation  Patient identified by MRN, date of birth, ID band Patient awake    Reviewed: Allergy & Precautions, NPO status , Patient's Chart, lab work & pertinent test results, reviewed documented beta blocker date and time   Airway Mallampati: II  TM Distance: >3 FB Neck ROM: Full    Dental  (+) Lower Dentures, Upper Dentures   Pulmonary former smoker,    breath sounds clear to auscultation       Cardiovascular hypertension, Pt. on medications and Pt. on home beta blockers + CAD, + CABG and + Peripheral Vascular Disease   Rhythm:Regular Rate:Normal  03/2017 Echo: Study Conclusions  - Left ventricle: Inferobasal hypokinesis The cavity size was   normal. Wall thickness was increased in a pattern of mild LVH.  Systolic function was normal. The estimated ejection fraction was  in the range of 55% to 60%. - Aortic valve: There was mild regurgitation. - Mitral valve: There was mild regurgitation. - Atrial septum: No defect or patent foramen ovale was identified.   Neuro/Psych  Headaches, CVA    GI/Hepatic negative GI ROS, Neg liver ROS,   Endo/Other  negative endocrine ROS  Renal/GU Renal diseaseS/p kidney transplant     Musculoskeletal   Abdominal   Peds  Hematology negative hematology ROS (+)   Anesthesia Other Findings   Reproductive/Obstetrics                             Lab Results  Component Value Date   WBC 7.8 08/11/2017   HGB 17.9 (H) 08/11/2017   HCT 54.2 (H) 08/11/2017   MCV 82 08/11/2017   PLT 170 08/11/2017   Lab Results  Component Value Date   CREATININE 1.53 (H) 08/11/2017   BUN 25 (H) 08/11/2017   NA 136 08/11/2017   K 5.2 08/11/2017   CL 104 08/11/2017   CO2 16 (L) 08/11/2017    Anesthesia Physical Anesthesia Plan  ASA: III  Anesthesia Plan: MAC   Post-op Pain Management:    Induction: Intravenous  PONV Risk Score and Plan: 1 and Ondansetron,  Propofol infusion and Treatment may vary due to age or medical condition  Airway Management Planned: Natural Airway and Simple Face Mask  Additional Equipment:   Intra-op Plan:   Post-operative Plan:   Informed Consent: I have reviewed the patients History and Physical, chart, labs and discussed the procedure including the risks, benefits and alternatives for the proposed anesthesia with the patient or authorized representative who has indicated his/her understanding and acceptance.     Plan Discussed with: CRNA  Anesthesia Plan Comments:         Anesthesia Quick Evaluation

## 2017-08-21 NOTE — Discharge Instructions (Signed)
No driving for 4 days. No lifting over 5 lbs for 1 week. No sexual activity for 1 week. You may return to work in 1 week. Keep procedure site clean & dry. If you notice increased pain, swelling, bleeding or pus, call/return!  You may shower, but no soaking baths/hot tubs/pools for 1 week.  ° ° °You have an appointment set up with the Atrial Fibrillation Clinic.  Multiple studies have shown that being followed by a dedicated atrial fibrillation clinic in addition to the standard care you receive from your other physicians improves health. We believe that enrollment in the atrial fibrillation clinic will allow us to better care for you.  ° °The phone number to the Atrial Fibrillation Clinic is [phone]. The clinic is staffed Monday through Friday from 8:30am to 5pm. ° °Parking Directions: The clinic is located in the Heart and Vascular Building connected to [facility]. °1)From [address] turn on to [address] and go to the 3rd entrance  (Heart and Vascular entrance) on the right. °2)Look to the right for Heart &Vascular Parking Garage. °3)A code for the entrance is required please call the clinic to receive this.   °4)Take the elevators to the 1st floor. Registration is in the room with the glass walls at the end of the hallway. ° °If you have any trouble parking or locating the clinic, please don’t hesitate to call [phone]. ° ° °

## 2017-08-21 NOTE — Transfer of Care (Signed)
Immediate Anesthesia Transfer of Care Note  Patient: [name]  Procedure(s) Performed: Procedure(s): Atrial Fibrillation Ablation (N/A)  Patient Location: PACU  Anesthesia Type:MAC  Level of Consciousness: awake, alert , oriented and patient cooperative  Airway & Oxygen Therapy: Patient Spontanous Breathing and Patient connected to face mask oxygen  Post-op Assessment: Report given to RN and Post -op Vital signs reviewed and stable  Post vital signs: Reviewed and stable  Last Vitals: 129/58, 65, 18,20 100% Vitals:   08/21/17 0958 08/21/17 1010  BP: (!) 100/57 104/60  Pulse: (!) 49 (!) 47  Resp: 15 15  Temp:    SpO2: 95% 95%    Last Pain:  Vitals:   08/21/17 0944  TempSrc: Oral         Complications: No apparent anesthesia complications

## 2017-08-21 NOTE — Interval H&P Note (Signed)
History and Physical Interval Note:  08/21/2017 9:15 AM  [name]  has presented today for surgery, with the diagnosis of afib  The various methods of treatment have been discussed with the patient and family. After consideration of risks, benefits and other options for treatment, the patient has consented to  Procedure(s): TRANSESOPHAGEAL ECHOCARDIOGRAM (TEE) (N/A) as a surgical intervention .  The patient's history has been reviewed, patient examined, no change in status, stable for surgery.  I have reviewed the patient's chart and labs.  Questions were answered to the patient's satisfaction.     Bambie Pizzolato [employer]

## 2017-08-21 NOTE — H&P (View-Only) (Signed)
Electrophysiology Office Note   Date:  08/11/2017   ID:  [name], DOB 09/16/1961, MRN [id number]  PCP:  Hulan Fess, MD    Primary Electrophysiologist:  Dr Caryl Comes  Chief Complaint  Patient presents with  . Atrial Fibrillation     History of Present Illness: [name] is a 56 y.o. male who presents today for electrophysiology evaluation.   He presents for further evaluation of atrial arrhythmias.   He reports in 2013 having typical atrial flutter with post termination pauses.  He was evaluated by Dr Caryl Comes and underwent CTI ablation at that time.  He did well for several years.  Since that time he has had afib and atrial flutter.  He presented 7/18 and required cardioversion for atrial flutter.  His atrial flutter is not completely typical appearing but also not clearly atypical.  He reports fatigue and decreased exercise tolerance during his atrial arrhythmias.   He has previously tried amiodarone in 2013 but could not tolerate this medicine due to bradycardia.  He is s/p renal transplant and with CRI does not have many AAD options.  He also has HCM with severe LA enlargement.   Today, he denies symptoms of palpitations, chest pain, shortness of breath, orthopnea, PND, lower extremity edema, claudication, dizziness, presyncope, syncope, bleeding, or neurologic sequela. The patient is tolerating medications without difficulties and is otherwise without complaint today.    Past Medical History:  Diagnosis Date  . CAD (coronary artery disease)    a. s/p CABG 03/2012: LIMA to LAD, free RIMA to OM1, SVG to D1, sequential SVG to AM and LPLB2, EVH via right thigh and leg.  . Cellulitis 04/13/2012   RLE saphenous vein harvest incision   . Gout   . Hyperlipidemia   . Hyperparathyroidism   . Hypertension   . Hypertrophic cardiomyopathy ([place])   . Migraines   . PAF and Flutter    a. Afib post-op CABG; AFlutter 10/2012  . Peripheral vascular disease ([place])   . Sinus node  dysfunction-post termination pause    a. >8sec in setting of aflutter 10/2012  . Stroke [facility]) 1995; 2001   denies residual  . Superficial vein thrombosis    a. R greater saphenous 04/2012   Past Surgical History:  Procedure Laterality Date  . ATRIAL FLUTTER ABLATION N/A 11/09/2012   Procedure: ATRIAL FLUTTER ABLATION;  Surgeon: [name], MD;  Location: [facility] CATH LAB;  Service: Cardiovascular;  Laterality: N/A;  . AV FISTULA PLACEMENT  2011   left forearm  . AV FISTULA PLACEMENT    . CARDIAC CATHETERIZATION  03/16/12  . CARDIOVERSION N/A 06/10/2017   Procedure: CARDIOVERSION;  Surgeon: Pixie Casino, MD;  Location: [facility] ENDOSCOPY;  Service: Cardiovascular;  Laterality: N/A;  . CORONARY ARTERY BYPASS GRAFT  03/19/2012   Procedure: CORONARY ARTERY BYPASS GRAFTING (CABG);  Surgeon: Rexene Alberts, MD;  Location: [place];  Service: Open Heart Surgery;  Laterality: N/A;  (B) MAMMARY  . CYSTOSCOPY WITH BIOPSY N/A 08/15/2016   Procedure: CYSTOSCOPY WITH IDENTIFICATION OF RIGHT TRANSPLANTATION OF URETRAL ORFICE WITH FLUORESCEIN;  Surgeon: Carolan Clines, MD;  Location: [facility] ORS;  Service: Urology;  Laterality: N/A;  . DENTAL SURGERY  2008   multiple  . [place]   left  . FRACTURE SURGERY    . HERNIA REPAIR  38/1017   umbilical  . HOLMIUM LASER APPLICATION N/A 04/10/2584   Procedure: HOLMIUM LASER APPLICATION WITH 3 CM RIGHT BLADDER STONE;  Surgeon:  Carolan Clines, MD;  Location: [facility] ORS;  Service: Urology;  Laterality: N/A;  . INGUINAL HERNIA REPAIR  09/2009   bilaterally  . KIDNEY TRANSPLANT    . LEFT HEART CATHETERIZATION WITH CORONARY ANGIOGRAM N/A 03/16/2012   Procedure: LEFT HEART CATHETERIZATION WITH CORONARY ANGIOGRAM;  Surgeon: Sherren Mocha, MD;  Location: [facility] CATH LAB;  Service: Cardiovascular;  Laterality: N/A;  . RENAL ARTERY STENT  2001  . TRANSURETHRAL RESECTION OF BLADDER TUMOR N/A 08/15/2016   Procedure: TRANSURETHRAL BIOPSY OF RIGHT BLADDER WALL;   Surgeon: Carolan Clines, MD;  Location: [facility] ORS;  Service: Urology;  Laterality: N/A;     Current Outpatient Prescriptions  Medication Sig Dispense Refill  . allopurinol (ZYLOPRIM) 100 MG tablet Take 200 mg by mouth daily.     Marland Kitchen apixaban (ELIQUIS) 5 MG TABS tablet Take 1 tablet (5 mg total) by mouth 2 (two) times daily. 60 tablet 5  . cloNIDine (CATAPRES) 0.2 MG tablet Take 0.2 mg by mouth at bedtime.     . fenofibrate micronized (LOFIBRA) 134 MG capsule Take 134 mg by mouth daily.     . magnesium oxide (MAG-OX) 400 (241.3 Mg) MG tablet Take 400 mg by mouth 2 (two) times daily.    . metoprolol tartrate (LOPRESSOR) 50 MG tablet Take 1.5 tablet by mouth in the AM and 1 tablet by mouth in the PM    . mycophenolate (MYFORTIC) 180 MG EC tablet Take 720 mg by mouth 2 (two) times daily.     Marland Kitchen omega-3 acid ethyl esters (LOVAZA) 1 G capsule Take 2 g by mouth 2 (two) times daily.    Marland Kitchen omeprazole (PRILOSEC) 20 MG capsule Take 20 mg by mouth daily.  5  . potassium chloride SA (KLOR-CON M15) 15 MEQ tablet Take 30 mEq by mouth 2 (two) times daily.    . predniSONE (DELTASONE) 5 MG tablet Take 5 mg by mouth at bedtime.    . simvastatin (ZOCOR) 40 MG tablet Take 40 mg by mouth See admin instructions. Pt takes one tablet every day except Friday and Saturday.    . sulfamethoxazole-trimethoprim (BACTRIM,SEPTRA) 400-80 MG tablet Take 1 tablet by mouth every Monday, Wednesday, and Friday.  6  . tacrolimus (PROGRAF) 0.5 MG capsule Take 0.5 mg by mouth every evening.  2  . tacrolimus (PROGRAF) 1 MG capsule Take 1 mg by mouth every morning.   5  . Vitamin D, Ergocalciferol, (DRISDOL) 50000 units CAPS capsule Take 50,000 Units by mouth every 7 (seven) days. Pt takes on Tuesday  5   No current facility-administered medications for this visit.     Allergies:   Contrast media [iodinated diagnostic agents]; Penicillins; and Lisinopril   Social History:  The patient  reports that he quit smoking about 5 years ago.  His smoking use included Cigarettes. He has a 9.00 pack-year smoking history. He has never used smokeless tobacco. He reports that he drinks alcohol. He reports that he does not use drugs.   Family History:  The patient's  family history includes Hypertension in his other. He was adopted.    ROS:  Please see the history of present illness.   All other systems are personally reviewed and negative.    PHYSICAL EXAM: VS:  BP 136/80   Pulse 60   Ht 5\' 8"  (1.727 m)   Wt 191 lb 3.2 oz (86.7 kg)   SpO2 98%   BMI 29.07 kg/m  , BMI Body mass index is 29.07 kg/m. GEN: Well nourished, well developed,  in no acute distress  HEENT: normal  Neck: no JVD, carotid bruits, or masses Cardiac: RRR; no murmurs, rubs, or gallops,no edema  Respiratory:  clear to auscultation bilaterally, normal work of breathing GI: soft, nontender, nondistended, + BS MS: no deformity or atrophy  Skin: warm and dry  Neuro:  Strength and sensation are intact Psych: euthymic mood, full affect  EKG:  EKG is ordered today. The ekg ordered today is personally reviewed and shows sinus rhythm with frequent PACs, LVH, repolarization abnormality, LAHB   Recent Labs: 06/08/2017: ALT 38; TSH 0.697 06/14/2017: BUN 29; Creatinine, Ser 1.69; Hemoglobin 17.7; Magnesium 1.7; Platelets 186; Potassium 4.5; Sodium 136  personally reviewed   Lipid Panel  No results found for: CHOL, TRIG, HDL, CHOLHDL, VLDL, LDLCALC, LDLDIRECT personally reviewed   Wt Readings from Last 3 Encounters:  08/11/17 191 lb 3.2 oz (86.7 kg)  06/11/17 195 lb 15.8 oz (88.9 kg)  03/26/17 190 lb (86.2 kg)      Other studies personally reviewed: Additional studies/ records that were reviewed today include: Dr Aquilla Hacker notes, prior ekgs, prior echo  Review of the above records today demonstrates: as above   ASSESSMENT AND PLAN:  1.  Atrial arrhythmias The patient has a h/o paroxysmal atrial fibrillation.  He also has atrial flutter.  Though his atrial  flutter may be isthmus dependant, it is not completely typical.  He has had prior CTI ablation by Dr Caryl Comes in 2013. Therapeutic strategies for afib/ atrial flutter including medicine and ablation were discussed in detail with the patient today.  Given HCM and renal insufficiency, our medicine options are limited. Risk, benefits, and alternatives to EP study and radiofrequency ablation were also discussed in detail today. These risks include but are not limited to stroke, bleeding, vascular damage, tamponade, perforation, damage to the esophagus, lungs, and other structures, pulmonary vein stenosis, worsening renal function, and death. The patient understands these risk and wishes to proceed.  We will therefore proceed with catheter ablation at the next available time.  Continue on eliquis. TEE is planned prior to ablation  2. HCM Stable Given severe LA enlargement, I worry about further arrhythmias in the future  3. S/p renal transplant Creatinine 1.5-1.6 at baseline Will avoid contrast with ablation as able  4. CAD No ischemic symptoms No changes  5. HTN Stable No change required today    Current medicines are reviewed at length with the patient today.   The patient does not have concerns regarding his medicines.  The following changes were made today:  none   Signed, [name], MD  08/11/2017 3:50 PM     [address] [place] [address] [phone] (office) [phone] (fax)

## 2017-08-21 NOTE — OR Nursing (Signed)
Device was not associated to capture VS.  The BP, respiration and pulse sat stable through TEE procedure.   BP 120/80-94/51, sat 97-100%, respations 14-16 throughout procedure PCO2 25-27 throughout procedure.

## 2017-08-21 NOTE — Anesthesia Postprocedure Evaluation (Signed)
Anesthesia Post Note  Patient: [name]  Procedure(s) Performed: Procedure(s) (LRB): Atrial Fibrillation Ablation (N/A)     Patient location during evaluation: PACU Anesthesia Type: MAC Level of consciousness: awake and alert Pain management: pain level controlled Vital Signs Assessment: post-procedure vital signs reviewed and stable Respiratory status: spontaneous breathing, nonlabored ventilation, respiratory function stable and patient connected to nasal cannula oxygen Cardiovascular status: stable and blood pressure returned to baseline Postop Assessment: no apparent nausea or vomiting Anesthetic complications: no    Last Vitals:  Vitals:   08/21/17 1415 08/21/17 1420  BP: 117/65 119/64  Pulse: (!) 52 (!) 56  Resp: 15 15  Temp:    SpO2: 95% 96%    Last Pain:  Vitals:   08/21/17 1351  TempSrc: Temporal  PainSc: 8                  Tiajuana Amass

## 2017-08-22 ENCOUNTER — Encounter (HOSPITAL_COMMUNITY): Payer: Self-pay | Admitting: Internal Medicine

## 2017-08-22 DIAGNOSIS — I422 Other hypertrophic cardiomyopathy: Secondary | ICD-10-CM | POA: Diagnosis not present

## 2017-08-22 DIAGNOSIS — I480 Paroxysmal atrial fibrillation: Secondary | ICD-10-CM | POA: Diagnosis not present

## 2017-08-22 DIAGNOSIS — E785 Hyperlipidemia, unspecified: Secondary | ICD-10-CM | POA: Diagnosis not present

## 2017-08-22 DIAGNOSIS — Z940 Kidney transplant status: Secondary | ICD-10-CM | POA: Diagnosis not present

## 2017-08-22 DIAGNOSIS — I484 Atypical atrial flutter: Secondary | ICD-10-CM | POA: Diagnosis not present

## 2017-08-22 DIAGNOSIS — Z87891 Personal history of nicotine dependence: Secondary | ICD-10-CM | POA: Diagnosis not present

## 2017-08-22 DIAGNOSIS — N189 Chronic kidney disease, unspecified: Secondary | ICD-10-CM | POA: Diagnosis not present

## 2017-08-22 DIAGNOSIS — I129 Hypertensive chronic kidney disease with stage 1 through stage 4 chronic kidney disease, or unspecified chronic kidney disease: Secondary | ICD-10-CM | POA: Diagnosis not present

## 2017-08-22 DIAGNOSIS — Z951 Presence of aortocoronary bypass graft: Secondary | ICD-10-CM | POA: Diagnosis not present

## 2017-08-22 LAB — BASIC METABOLIC PANEL
ANION GAP: 8 (ref 5–15)
BUN: 19 mg/dL (ref 6–20)
CALCIUM: 8.7 mg/dL — AB (ref 8.9–10.3)
CHLORIDE: 107 mmol/L (ref 101–111)
CO2: 22 mmol/L (ref 22–32)
Creatinine, Ser: 1.51 mg/dL — ABNORMAL HIGH (ref 0.61–1.24)
GFR calc non Af Amer: 51 mL/min — ABNORMAL LOW (ref >60)
GFR, EST AFRICAN AMERICAN: 59 mL/min — AB (ref >60)
GLUCOSE: 122 mg/dL — AB (ref 65–99)
POTASSIUM: 4 mmol/L (ref 3.5–5.1)
Sodium: 137 mmol/L (ref 135–145)

## 2017-08-22 NOTE — Plan of Care (Signed)
Problem: Physical Regulation: Goal: Ability to maintain clinical measurements within normal limits will improve Outcome: Progressing Pt now off bedrest. Ablation site intact. No s/s of complication. Pt ambulated hall w/ wife. Tolerated well. PO medications administered. Other VSS. Will continue to monitor.

## 2017-08-25 ENCOUNTER — Encounter (HOSPITAL_COMMUNITY): Payer: 59

## 2017-08-26 ENCOUNTER — Telehealth (HOSPITAL_COMMUNITY): Payer: Self-pay | Admitting: *Deleted

## 2017-08-26 DIAGNOSIS — I2510 Atherosclerotic heart disease of native coronary artery without angina pectoris: Secondary | ICD-10-CM | POA: Diagnosis not present

## 2017-08-26 DIAGNOSIS — Z6830 Body mass index (BMI) 30.0-30.9, adult: Secondary | ICD-10-CM | POA: Diagnosis not present

## 2017-08-26 DIAGNOSIS — I129 Hypertensive chronic kidney disease with stage 1 through stage 4 chronic kidney disease, or unspecified chronic kidney disease: Secondary | ICD-10-CM | POA: Diagnosis not present

## 2017-08-26 DIAGNOSIS — E559 Vitamin D deficiency, unspecified: Secondary | ICD-10-CM | POA: Diagnosis not present

## 2017-08-26 DIAGNOSIS — D751 Secondary polycythemia: Secondary | ICD-10-CM | POA: Diagnosis not present

## 2017-08-26 DIAGNOSIS — I421 Obstructive hypertrophic cardiomyopathy: Secondary | ICD-10-CM | POA: Diagnosis not present

## 2017-08-26 DIAGNOSIS — Z940 Kidney transplant status: Secondary | ICD-10-CM | POA: Diagnosis not present

## 2017-08-26 DIAGNOSIS — E785 Hyperlipidemia, unspecified: Secondary | ICD-10-CM | POA: Diagnosis not present

## 2017-08-26 MED ORDER — DILTIAZEM HCL 60 MG PO TABS: ORAL_TABLET | ORAL | Status: DC

## 2017-08-26 NOTE — Telephone Encounter (Signed)
Pt called in wanting instructions of when to go to ER versus just calling and talking to someone post-ablation. Went over ER recommendations. Pt states since ablation Thursday he has noticed intermittent elevated HR -- he has taken PRN cardizem with a few episodes which converted him back into normal. Pt expresses understanding on when to call versus when to report to ER. Reminded of 97month healing period after ablation where afib can be expected.

## 2017-08-27 DIAGNOSIS — D3611 Benign neoplasm of peripheral nerves and autonomic nervous system of face, head, and neck: Secondary | ICD-10-CM | POA: Diagnosis not present

## 2017-08-29 ENCOUNTER — Other Ambulatory Visit: Payer: Self-pay | Admitting: Physician Assistant

## 2017-08-29 DIAGNOSIS — R079 Chest pain, unspecified: Secondary | ICD-10-CM

## 2017-08-29 MED FILL — SULFAMETHOXAZOLE/TMP SS TAB: 400-80 | 28 days supply | Qty: 12 | Fill #3

## 2017-08-29 MED FILL — CIALIS 5 MG TABLET: 5 | 30 days supply | Qty: 30 | Fill #7

## 2017-08-29 MED FILL — TACROLIMUS 0.5 MG CAPSULE: 0.5 | 30 days supply | Qty: 30 | Fill #2

## 2017-08-29 MED FILL — METOPROLOL TARTRATE 25 MG T: 25 | 30 days supply | Qty: 60 | Fill #2

## 2017-08-29 MED FILL — TACROLIMUS 1 MG CAPSULE: 1 | 30 days supply | Qty: 60 | Fill #3

## 2017-08-29 MED FILL — ELIQUIS 5 MG TABLET: 5 | 30 days supply | Qty: 60 | Fill #0

## 2017-09-05 MED FILL — POTASSIUM CITRATE ER 15 MEQ: 15 MEQ | 30 days supply | Qty: 120 | Fill #3

## 2017-09-05 MED FILL — FENOFIBRATE 134 MG CAPSULE: 134 | 30 days supply | Qty: 30 | Fill #0

## 2017-09-05 MED FILL — OMEPRAZOLE 20 MG CAP: 20 | 30 days supply | Qty: 30 | Fill #0

## 2017-09-05 MED FILL — predniSONE 5 MG TABS: 5 | 30 days supply | Qty: 30 | Fill #4

## 2017-09-05 MED FILL — ALLOPURINOL 100 MG TABLET: 100 | 30 days supply | Qty: 60 | Fill #5

## 2017-09-05 MED FILL — MYCOPHENOLIC ACID DR 180 MG: 180 | 30 days supply | Qty: 240 | Fill #0

## 2017-09-11 ENCOUNTER — Telehealth (HOSPITAL_COMMUNITY): Payer: Self-pay | Admitting: *Deleted

## 2017-09-11 NOTE — Telephone Encounter (Signed)
Pt cld reporting SOB and that he is having afib 4-5 times a week and unable to get a full breath.  Pt stated that he understood that after ablation this could increase but wants to know if a medication change is in order.  Pt reports using Kardia monitor at home and rates being over 140s and 150s at times.  He was calling from work today and was not able to give me current rate.  Pt also states he is having higher BPs and wanted to know if he should go back to labetalol instead of the metoprolol he is taking now.  Also stated that Dr.  Rayann Heman had mentioned possibly using Amiodarone in the future.  Orson Eva, NP is out of the office tomorrow.  Pt would like someone to call him back to advise what his next course of action should be.  Pt is currently taking 150 mg of Metoprolol at this time. Pt has to use the cardizem 60 mg sparingly due to the tacrolimus.  Please contact pt with advice.

## 2017-09-12 ENCOUNTER — Telehealth: Payer: Self-pay | Admitting: Internal Medicine

## 2017-09-12 ENCOUNTER — Encounter: Payer: Self-pay | Admitting: Internal Medicine

## 2017-09-12 ENCOUNTER — Ambulatory Visit (INDEPENDENT_AMBULATORY_CARE_PROVIDER_SITE_OTHER): Payer: 59 | Admitting: Internal Medicine

## 2017-09-12 VITALS — BP 106/78 | HR 123 | Ht 68.0 in | Wt 193.2 lb

## 2017-09-12 DIAGNOSIS — I169 Hypertensive crisis, unspecified: Secondary | ICD-10-CM | POA: Diagnosis not present

## 2017-09-12 DIAGNOSIS — Z940 Kidney transplant status: Secondary | ICD-10-CM | POA: Diagnosis not present

## 2017-09-12 DIAGNOSIS — I25700 Atherosclerosis of coronary artery bypass graft(s), unspecified, with unstable angina pectoris: Secondary | ICD-10-CM | POA: Diagnosis not present

## 2017-09-12 DIAGNOSIS — R739 Hyperglycemia, unspecified: Secondary | ICD-10-CM | POA: Diagnosis not present

## 2017-09-12 DIAGNOSIS — I4819 Other persistent atrial fibrillation: Secondary | ICD-10-CM

## 2017-09-12 DIAGNOSIS — I481 Persistent atrial fibrillation: Secondary | ICD-10-CM

## 2017-09-12 DIAGNOSIS — I422 Other hypertrophic cardiomyopathy: Secondary | ICD-10-CM

## 2017-09-12 DIAGNOSIS — Z7689 Persons encountering health services in other specified circumstances: Secondary | ICD-10-CM | POA: Diagnosis not present

## 2017-09-12 MED ORDER — AMIODARONE HCL 200 MG PO TABS: 200.0000 mg | ORAL_TABLET | Freq: Two times a day (BID) | ORAL | 11 refills | Status: DC

## 2017-09-12 MED FILL — AMIODARONE HCL 200 MG TAB: 200 | 90 days supply | Qty: 180 | Fill #0

## 2017-09-12 NOTE — Progress Notes (Signed)
PCP: Patient, No Pcp Per  [name] is a 56 y.o. male who presents today for routine electrophysiology followup s/p recent afib ablation.  This is an urgent add on visit today due to increased symptomatic arrhythmias/ ERAF post ablation.  He reports frequent palpitations, SOB at rest, and fatigue.   Today, he denies symptoms of palpitations, chest pain, shortness of breath,  lower extremity edema, dizziness, presyncope, or syncope.  The patient is otherwise without complaint today.   Past Medical History:  Diagnosis Date  . CAD (coronary artery disease)    a. s/p CABG 03/2012: LIMA to LAD, free RIMA to OM1, SVG to D1, sequential SVG to AM and LPLB2, EVH via right thigh and leg.  . Cellulitis 04/13/2012   RLE saphenous vein harvest incision   . CKD (chronic kidney disease)   . Gout   . History of kidney transplant   . Hyperlipidemia   . Hyperparathyroidism   . Hypertension   . Hypertrophic cardiomyopathy ([place])   . Migraines   . PAF and Flutter    a. Afib post-op CABG; AFlutter 10/2012  . Peripheral vascular disease ([place])   . Sinus node dysfunction-post termination pause    a. >8sec in setting of aflutter 10/2012  . Stroke [facility]) 1995; 2001   denies residual  . Superficial vein thrombosis    a. R greater saphenous 04/2012   Past Surgical History:  Procedure Laterality Date  . ATRIAL FIBRILLATION ABLATION N/A 08/21/2017   Procedure: Atrial Fibrillation Ablation;  Surgeon: Steven Grayer, MD;  Location: [place] CV LAB;  Service: Cardiovascular;  Laterality: N/A;  . ATRIAL FLUTTER ABLATION N/A 11/09/2012   Procedure: ATRIAL FLUTTER ABLATION;  Surgeon: [name], MD;  Location: [facility] CATH LAB;  Service: Cardiovascular;  Laterality: N/A;  . AV FISTULA PLACEMENT  2011   left forearm  . AV FISTULA PLACEMENT    . CARDIAC CATHETERIZATION  03/16/12  . CARDIOVERSION N/A 06/10/2017   Procedure: CARDIOVERSION;  Surgeon: Pixie Casino, MD;  Location: [facility]  ENDOSCOPY;  Service: Cardiovascular;  Laterality: N/A;  . CORONARY ARTERY BYPASS GRAFT  03/19/2012   Procedure: CORONARY ARTERY BYPASS GRAFTING (CABG);  Surgeon: Rexene Alberts, MD;  Location: [place];  Service: Open Heart Surgery;  Laterality: N/A;  (B) MAMMARY  . CYSTOSCOPY WITH BIOPSY N/A 08/15/2016   Procedure: CYSTOSCOPY WITH IDENTIFICATION OF RIGHT TRANSPLANTATION OF URETRAL ORFICE WITH FLUORESCEIN;  Surgeon: Carolan Clines, MD;  [facility]: [facility];  Service: Urology;  Laterality: N/A;  . DENTAL SURGERY  2008   multiple  . [place]   left  . FRACTURE SURGERY    . HERNIA REPAIR  51/8841   umbilical  . HOLMIUM LASER APPLICATION N/A 6/60/6301   Procedure: HOLMIUM LASER APPLICATION WITH 3 CM RIGHT BLADDER STONE;  Surgeon: Carolan Clines, MD;  Location: [facility];  Service: Urology;  Laterality: N/A;  . INGUINAL HERNIA REPAIR  09/2009   bilaterally  . KIDNEY TRANSPLANT    . LEFT HEART CATHETERIZATION WITH CORONARY ANGIOGRAM N/A 03/16/2012   Procedure: LEFT HEART CATHETERIZATION WITH CORONARY ANGIOGRAM;  Surgeon: Sherren Mocha, MD;  Location: [facility] CATH LAB;  Service: Cardiovascular;  Laterality: N/A;  . RENAL ARTERY STENT  2001  . TEE WITHOUT CARDIOVERSION N/A 08/21/2017   Procedure: TRANSESOPHAGEAL ECHOCARDIOGRAM (TEE);  Surgeon: Larey Dresser, MD;  Location: [facility] ENDOSCOPY;  Service: Cardiovascular;  Laterality: N/A;  . TRANSURETHRAL RESECTION OF BLADDER TUMOR N/A 08/15/2016  Procedure: TRANSURETHRAL BIOPSY OF RIGHT BLADDER WALL;  Surgeon: Carolan Clines, MD;  [facility];  Service: Urology;  Laterality: N/A;    ROS- all systems are reviewed and negatives except as per HPI above  Current Outpatient Prescriptions  Medication Sig Dispense Refill  . allopurinol (ZYLOPRIM) 100 MG tablet Take 200 mg by mouth daily.     . cloNIDine (CATAPRES) 0.2 MG tablet Take 0.2 mg by mouth at bedtime.     Marland Kitchen diltiazem (CARDIZEM) 60 MG tablet Take 1 tablet as needed every 6  hours for rapid afib HR over 100.    Marland Kitchen ELIQUIS 5 MG TABS tablet TAKE 1 TABLET BY MOUTH 2 TIMES DAILY. 60 tablet 10  . fenofibrate micronized (LOFIBRA) 134 MG capsule Take 134 mg by mouth daily.     . magnesium oxide (MAG-OX) 400 (241.3 Mg) MG tablet Take 400 mg by mouth 2 (two) times daily.    . metoprolol tartrate (LOPRESSOR) 50 MG tablet Take 25-50 mg by mouth See admin instructions. Take 1 tabl (50mg ) in the morning, 1/2 tab (25mg ) in the afternoon take 1/2 tablet in the evening, and 1 tab (50mg ) at night    . mycophenolate (MYFORTIC) 180 MG EC tablet Take 720 mg by mouth 2 (two) times daily.     Marland Kitchen omega-3 acid ethyl esters (LOVAZA) 1 G capsule Take 2 g by mouth 2 (two) times daily.    Marland Kitchen omeprazole (PRILOSEC) 20 MG capsule Take 20 mg by mouth daily.  5  . potassium chloride SA (KLOR-CON M15) 15 MEQ tablet Take 15 mEq by mouth 2 (two) times daily.     . predniSONE (DELTASONE) 5 MG tablet Take 5 mg by mouth at bedtime.    . simvastatin (ZOCOR) 40 MG tablet Take 40 mg by mouth See admin instructions. Pt takes one tablet every day except Friday and Saturday.    . sulfamethoxazole-trimethoprim (BACTRIM,SEPTRA) 400-80 MG tablet Take 1 tablet by mouth every Monday, Wednesday, and Friday.  6  . tacrolimus (PROGRAF) 0.5 MG capsule Take 0.5 mg by mouth every evening.  2  . tacrolimus (PROGRAF) 1 MG capsule Take 1 mg by mouth every morning.   5   No current facility-administered medications for this visit.     Physical Exam: Vitals:   09/12/17 1601  BP: 106/78  Pulse: (!) 123  SpO2: 96%  Weight: 193 lb 3.2 oz (87.6 kg)  Height: 5\' 8"  (1.727 m)    GEN- The patient is well appearing, alert and oriented x 3 today.   Head- normocephalic, atraumatic Eyes-  Sclera clear, conjunctiva pink Ears- hearing intact Oropharynx- clear Lungs- Clear to ausculation bilaterally, normal work of breathing Heart- irregular rate and rhythm, no murmurs, rubs or gallops, PMI not laterally displaced GI- soft, NT,  ND, + BS Extremities- no clubbing, cyanosis, or edema  EKG tracing ordered today is personally reviewed and shows afib with RVR, V rate 123 bpm, LVH, incomplete LBBB  Assessment and Plan:  1. Atrial fibrillation with RVR/ atypical atrial flutter Just several weeks post ablation.  He is taking up to 200mg  of metoprolol each day.  He is reluctant to take diltiazem due to interactions with his prograf I have reassured the patient that this is not an uncommon event post ablation.  He does have severe LA enlargement and may require AAD therapy long term. Continue anticoagulation without interruption AAD options of norpace and amiodarone were discussed at length today.  Risks and benefits of each medicine were discussed  at length. Risks and benefits to amiodarone were discussed at length.  He is aware or risks not limited to liver/thyroid/lung toxicity with possible irreversible lung toxicity and even death.  He understands these risks and wishes to proceed with amiodarone.  I will start amiodarone 200mg  BID. Continue eliquis He will call the AF clinic early next week to let us know how he is doing.  He may require cardioversion if he does not convert to sinus within the next week.  2. HCM Stable No change required today  3. CRI S/p renal transplant Creatinine is 1.51 08/22/17 I have advised that he contact his nephrologist regarding the above medicine changes in case there are interactions with his antirejection medicines of which I am not aware  4. HTN Stable No change required today  5. HL Given reaction of amiodarone and simvastatin, he would like to stop simvastatin at this time We may consider a different statin in the future  Follow-up in AF clinic as scheduled He has my cell phon # and can call me with issues over the weekend  He has medically refractory ERAF with very high heart rates at home.  Very complicated situation given HCM, renal failure, and transplant medications.  A  high level of decision making was required for this encounter.  Steven Grayer MD, [facility] 09/12/2017 4:05 PM

## 2017-09-12 NOTE — Patient Instructions (Signed)
Medication Instructions:  Your physician has recommended you make the following change in your medication:  Stop Simvastatin  Start Amiodarone 200mg  twice a day   Labwork: None ordered   Testing/Procedures: None ordered   Follow-Up: Call Afib clinic  [phone] (call Monday)  Any Other Special Instructions Will Be Listed Below (If Applicable).     If you need a refill on your cardiac medications before your next appointment, please call your pharmacy.

## 2017-09-12 NOTE — Telephone Encounter (Signed)
Per Dr Cline Cools metoprolol 50 mg now (pt states his HR is 130, BP high), Dr Rayann Heman will see today at 4 PM.  I discussed with patient, he verbalized understanding, agreed with plan, I  advised pt not to drive to appointment today.

## 2017-09-12 NOTE — Telephone Encounter (Signed)
New Message     Should pt go to ER,  He is in AFIB had ablation 3 weeks ago , he seems to be getting worse.  He call the Afib clinic and they told him to call us and see if [facility] can work him in today ... Called [facility] Pod Varney Biles is going to page Dr Rayann Heman to see if he can come in today at 4pm

## 2017-09-15 ENCOUNTER — Telehealth (HOSPITAL_COMMUNITY): Payer: Self-pay | Admitting: *Deleted

## 2017-09-15 NOTE — Telephone Encounter (Signed)
Patient called in today with update since starting amiodarone on Friday. States he is feeling much better - HRs are now in the 80-90s. Will call if further issues.

## 2017-09-17 DIAGNOSIS — Z940 Kidney transplant status: Secondary | ICD-10-CM | POA: Diagnosis not present

## 2017-09-25 ENCOUNTER — Ambulatory Visit (HOSPITAL_COMMUNITY)
Admission: RE | Admit: 2017-09-25 | Discharge: 2017-09-25 | Disposition: A | Discharge status: Home / Self Care | Payer: 59 | Source: Ambulatory Visit | Attending: Nurse Practitioner | Admitting: Nurse Practitioner

## 2017-09-25 ENCOUNTER — Encounter (HOSPITAL_COMMUNITY): Payer: Self-pay | Admitting: Nurse Practitioner

## 2017-09-25 VITALS — BP 110/84 | HR 83 | Ht 68.0 in | Wt 194.2 lb

## 2017-09-25 DIAGNOSIS — I739 Peripheral vascular disease, unspecified: Secondary | ICD-10-CM | POA: Diagnosis not present

## 2017-09-25 DIAGNOSIS — I484 Atypical atrial flutter: Secondary | ICD-10-CM | POA: Insufficient documentation

## 2017-09-25 DIAGNOSIS — Z79899 Other long term (current) drug therapy: Secondary | ICD-10-CM | POA: Diagnosis not present

## 2017-09-25 DIAGNOSIS — Z951 Presence of aortocoronary bypass graft: Secondary | ICD-10-CM | POA: Insufficient documentation

## 2017-09-25 DIAGNOSIS — I2510 Atherosclerotic heart disease of native coronary artery without angina pectoris: Secondary | ICD-10-CM | POA: Insufficient documentation

## 2017-09-25 DIAGNOSIS — E785 Hyperlipidemia, unspecified: Secondary | ICD-10-CM | POA: Insufficient documentation

## 2017-09-25 DIAGNOSIS — Z86718 Personal history of other venous thrombosis and embolism: Secondary | ICD-10-CM | POA: Diagnosis not present

## 2017-09-25 DIAGNOSIS — G43909 Migraine, unspecified, not intractable, without status migrainosus: Secondary | ICD-10-CM | POA: Insufficient documentation

## 2017-09-25 DIAGNOSIS — M109 Gout, unspecified: Secondary | ICD-10-CM | POA: Insufficient documentation

## 2017-09-25 DIAGNOSIS — I129 Hypertensive chronic kidney disease with stage 1 through stage 4 chronic kidney disease, or unspecified chronic kidney disease: Secondary | ICD-10-CM | POA: Diagnosis not present

## 2017-09-25 DIAGNOSIS — Z8673 Personal history of transient ischemic attack (TIA), and cerebral infarction without residual deficits: Secondary | ICD-10-CM | POA: Diagnosis not present

## 2017-09-25 DIAGNOSIS — I4892 Unspecified atrial flutter: Secondary | ICD-10-CM

## 2017-09-25 DIAGNOSIS — Z940 Kidney transplant status: Secondary | ICD-10-CM | POA: Insufficient documentation

## 2017-09-25 DIAGNOSIS — I480 Paroxysmal atrial fibrillation: Secondary | ICD-10-CM | POA: Insufficient documentation

## 2017-09-25 DIAGNOSIS — I422 Other hypertrophic cardiomyopathy: Secondary | ICD-10-CM | POA: Diagnosis not present

## 2017-09-25 DIAGNOSIS — Z7901 Long term (current) use of anticoagulants: Secondary | ICD-10-CM | POA: Insufficient documentation

## 2017-09-25 DIAGNOSIS — N189 Chronic kidney disease, unspecified: Secondary | ICD-10-CM | POA: Insufficient documentation

## 2017-09-25 DIAGNOSIS — E213 Hyperparathyroidism, unspecified: Secondary | ICD-10-CM | POA: Diagnosis not present

## 2017-09-25 DIAGNOSIS — Z87891 Personal history of nicotine dependence: Secondary | ICD-10-CM | POA: Insufficient documentation

## 2017-09-26 MED FILL — TACROLIMUS 1 MG CAPSULE: 1 | 30 days supply | Qty: 60 | Fill #4

## 2017-09-26 MED FILL — SULFAMETHOXAZOLE/TMP SS TAB: 400-80 | 28 days supply | Qty: 12 | Fill #4

## 2017-09-26 MED FILL — TACROLIMUS 0.5 MG CAPSULE: 0.5 | 30 days supply | Qty: 30 | Fill #0

## 2017-09-26 MED FILL — METOPROLOL TARTRATE 25 MG T: 25 | 30 days supply | Qty: 60 | Fill #3

## 2017-09-26 MED FILL — ELIQUIS 5 MG TABLET: 5 | 30 days supply | Qty: 60 | Fill #1

## 2017-09-26 NOTE — Progress Notes (Signed)
Primary Care Physician: Patient, No Pcp Per Referring Physician: Dr. Everlene Farrier [name] is a 56 y.o. male with a h/o recent afib ablation. After procedure pt had  symptomatic arrhythmias. He was seen by Dr. Rayann Heman and started on 200 mg bid. In the clinic today, he feels much better but remains in rate controlled atrial flutter.  Today, he denies symptoms of palpitations, chest pain, shortness of breath, orthopnea, PND, lower extremity edema, dizziness, presyncope, syncope, or neurologic sequela. The patient is tolerating medications without difficulties and is otherwise without complaint today.   Past Medical History:  Diagnosis Date  . CAD (coronary artery disease)    a. s/p CABG 03/2012: LIMA to LAD, free RIMA to OM1, SVG to D1, sequential SVG to AM and LPLB2, EVH via right thigh and leg.  . Cellulitis 04/13/2012   RLE saphenous vein harvest incision   . CKD (chronic kidney disease)   . Gout   . History of kidney transplant   . Hyperlipidemia   . Hyperparathyroidism   . Hypertension   . Hypertrophic cardiomyopathy ([place])   . Migraines   . PAF and Flutter    a. Afib post-op CABG; AFlutter 10/2012  . Peripheral vascular disease ([place])   . Sinus node dysfunction-post termination pause    a. >8sec in setting of aflutter 10/2012  . Stroke [facility]) 1995; 2001   denies residual  . Superficial vein thrombosis    a. R greater saphenous 04/2012   Past Surgical History:  Procedure Laterality Date  . ATRIAL FIBRILLATION ABLATION N/A 08/21/2017   Procedure: Atrial Fibrillation Ablation;  Surgeon: [name], MD;  Location: [place] CV LAB;  Service: Cardiovascular;  Laterality: N/A;  . ATRIAL FLUTTER ABLATION N/A 11/09/2012   Procedure: ATRIAL FLUTTER ABLATION;  Surgeon: [name], MD;  Location: [facility] CATH LAB;  Service: Cardiovascular;  Laterality: N/A;  . AV FISTULA PLACEMENT  2011   left forearm  . AV FISTULA PLACEMENT    . CARDIAC CATHETERIZATION  03/16/12  . CARDIOVERSION  N/A 06/10/2017   Procedure: CARDIOVERSION;  Surgeon: Pixie Casino, MD;  Location: [facility] ENDOSCOPY;  Service: Cardiovascular;  Laterality: N/A;  . CORONARY ARTERY BYPASS GRAFT  03/19/2012   Procedure: CORONARY ARTERY BYPASS GRAFTING (CABG);  Surgeon: Rexene Alberts, MD;  Location: [place];  Service: Open Heart Surgery;  Laterality: N/A;  (B) MAMMARY  . CYSTOSCOPY WITH BIOPSY N/A 08/15/2016   Procedure: CYSTOSCOPY WITH IDENTIFICATION OF RIGHT TRANSPLANTATION OF URETRAL ORFICE WITH FLUORESCEIN;  Surgeon: Carolan Clines, MD;  [facility];  Service: Urology;  Laterality: N/A;  . DENTAL SURGERY  2008   multiple  . [place]   left  . FRACTURE SURGERY    . HERNIA REPAIR  61/9509   umbilical  . HOLMIUM LASER APPLICATION N/A 02/25/7123   Procedure: HOLMIUM LASER APPLICATION WITH 3 CM RIGHT BLADDER STONE;  Surgeon: Carolan Clines, MD;  [facility];  Service: Urology;  Laterality: N/A;  . INGUINAL HERNIA REPAIR  09/2009   bilaterally  . KIDNEY TRANSPLANT    . LEFT HEART CATHETERIZATION WITH CORONARY ANGIOGRAM N/A 03/16/2012   Procedure: LEFT HEART CATHETERIZATION WITH CORONARY ANGIOGRAM;  Surgeon: Sherren Mocha, MD;  Location: [facility] CATH LAB;  Service: Cardiovascular;  Laterality: N/A;  . RENAL ARTERY STENT  2001  . TEE WITHOUT CARDIOVERSION N/A 08/21/2017   Procedure: TRANSESOPHAGEAL ECHOCARDIOGRAM (TEE);  Surgeon: Larey Dresser, MD;  Location: [facility] ENDOSCOPY;  Service: Cardiovascular;  Laterality:  N/A;  . TRANSURETHRAL RESECTION OF BLADDER TUMOR N/A 08/15/2016   Procedure: TRANSURETHRAL BIOPSY OF RIGHT BLADDER WALL;  Surgeon: Carolan Clines, MD;  [facility];  Service: Urology;  Laterality: N/A;    Current Outpatient Prescriptions  Medication Sig Dispense Refill  . allopurinol (ZYLOPRIM) 100 MG tablet Take 200 mg by mouth daily.     Marland Kitchen amiodarone (PACERONE) 200 MG tablet Take 1 tablet (200 mg total) by mouth 2 (two) times daily. 180 tablet 11  . cloNIDine  (CATAPRES) 0.2 MG tablet Take 0.2 mg by mouth at bedtime.     Marland Kitchen diltiazem (CARDIZEM) 60 MG tablet Take 1 tablet as needed every 6 hours for rapid afib HR over 100.    Marland Kitchen ELIQUIS 5 MG TABS tablet TAKE 1 TABLET BY MOUTH 2 TIMES DAILY. 60 tablet 10  . fenofibrate micronized (LOFIBRA) 134 MG capsule Take 134 mg by mouth daily.     . magnesium oxide (MAG-OX) 400 (241.3 Mg) MG tablet Take 400 mg by mouth 2 (two) times daily.    . metoprolol tartrate (LOPRESSOR) 50 MG tablet Take 25-50 mg by mouth See admin instructions. Take 1 tabl (50mg ) in the morning, 1/2 tab (25mg ) in the afternoon take 1/2 tablet in the evening, and 1 tab (50mg ) at night    . mycophenolate (MYFORTIC) 180 MG EC tablet Take 720 mg by mouth 2 (two) times daily.     Marland Kitchen omega-3 acid ethyl esters (LOVAZA) 1 G capsule Take 2 g by mouth 2 (two) times daily.    Marland Kitchen omeprazole (PRILOSEC) 20 MG capsule Take 20 mg by mouth daily.  5  . potassium chloride SA (KLOR-CON M15) 15 MEQ tablet Take 15 mEq by mouth 2 (two) times daily.     . predniSONE (DELTASONE) 5 MG tablet Take 5 mg by mouth at bedtime.    . sulfamethoxazole-trimethoprim (BACTRIM,SEPTRA) 400-80 MG tablet Take 1 tablet by mouth every Monday, Wednesday, and Friday.  6  . tacrolimus (PROGRAF) 0.5 MG capsule Take 0.5 mg by mouth every evening.  2  . tacrolimus (PROGRAF) 1 MG capsule Take 1 mg by mouth every morning.   5   No current facility-administered medications for this encounter.     Allergies  Allergen Reactions  . Contrast Media [Iodinated Diagnostic Agents] Other (See Comments)    Pt states that this medication causes him to code.   Marland Kitchen Penicillins Itching, Rash and Other (See Comments)    Has patient had a PCN reaction causing immediate rash, facial/tongue/throat swelling, SOB or lightheadedness with hypotension: No Has patient had a PCN reaction causing severe rash involving mucus membranes or skin necrosis: No Has patient had a PCN reaction that required hospitalization  No Has patient had a PCN reaction occurring within the last 10 years: No If all of the above answers are "NO", then may proceed with Cephalosporin use.  Marland Kitchen Lisinopril     Increased Cr    Social History   Social History  . Marital status: Married    Spouse name: N/A  . Number of children: N/A  . Years of education: N/A   Occupational History  . Not on file.   Social History Main Topics  . Smoking status: Former Smoker    Packs/day: 0.30    Years: 30.00    Types: Cigarettes    Quit date: 03/15/2012  . Smokeless tobacco: Never Used  . Alcohol use Yes     Comment: 04/13/12 "2 mixed drinks/month"  . Drug use: No  .  Sexual activity: Yes   Other Topics Concern  . Not on file   Social History Narrative  . No narrative on file    Family History  Problem Relation Age of Onset  . Adopted: Yes  . Hypertension Other     ROS- All systems are reviewed and negative except as per the HPI above  Physical Exam: Vitals:   09/25/17 1502  BP: 110/84  Pulse: 83  Weight: 194 lb 3.2 oz (88.1 kg)  Height: 5\' 8"  (1.727 m)   Wt Readings from Last 3 Encounters:  09/25/17 194 lb 3.2 oz (88.1 kg)  09/12/17 193 lb 3.2 oz (87.6 kg)  08/22/17 196 lb 6.4 oz (89.1 kg)    Labs: Lab Results  Component Value Date   NA 137 08/22/2017   K 4.0 08/22/2017   CL 107 08/22/2017   CO2 22 08/22/2017   GLUCOSE 122 (H) 08/22/2017   BUN 19 08/22/2017   CREATININE 1.51 (H) 08/22/2017   CALCIUM 8.7 (L) 08/22/2017   PHOS 6.9 (H) 04/16/2012   MG 1.7 06/14/2017   Lab Results  Component Value Date   INR 1.07 03/01/2017   No results found for: CHOL, HDL, LDLCALC, TRIG   GEN- The patient is well appearing, alert and oriented x 3 today.   Head- normocephalic, atraumatic Eyes-  Sclera clear, conjunctiva pink Ears- hearing intact Oropharynx- clear Neck- supple, no JVP Lymph- no cervical lymphadenopathy Lungs- Clear to ausculation bilaterally, normal work of breathing Heart- Regular rate and  rhythm, no murmurs, rubs or gallops, PMI not laterally displaced GI- soft, NT, ND, + BS Extremities- no clubbing, cyanosis, or edema MS- no significant deformity or atrophy Skin- no rash or lesion Psych- euthymic mood, full affect Neuro- strength and sensation are intact  EKG- atrial flutter at 83 bpm, qrs int 124 ms, qtc 481 ms Epic records reviewed    Assessment and Plan: 1. H/o afib  S/p ablation Atrial flutter s/p ablation Feels much improved on amiodarone 200 mg bid but remains in flutter Will let load on amiodarone for 2 more weeks, as he been on just shy of 2 weeks, at which time he will return to clinic and if remains in flutter will set up for cardioversion He is on eliquis 5 mg bid without missed doses and reminded not to miss any doses  Butch Penny C. Laurice Kimmons, [facility] [address] [place], [place] [address] [phone]

## 2017-09-30 DIAGNOSIS — Z940 Kidney transplant status: Secondary | ICD-10-CM | POA: Diagnosis not present

## 2017-10-03 MED FILL — FENOFIBRATE 134 MG CAPSULE: 134 | 30 days supply | Qty: 30 | Fill #1

## 2017-10-03 MED FILL — OMEPRAZOLE 20 MG CAP: 20 | 30 days supply | Qty: 30 | Fill #1

## 2017-10-03 MED FILL — METOPROLOL TARTRATE 50 MG T: 50 | 90 days supply | Qty: 180 | Fill #1

## 2017-10-07 DIAGNOSIS — Z940 Kidney transplant status: Secondary | ICD-10-CM | POA: Diagnosis not present

## 2017-10-07 MED FILL — cloNIDine HCL 0.2 MG TABS: 0.2 | 30 days supply | Qty: 30 | Fill #0

## 2017-10-07 MED FILL — predniSONE 5 MG TABS: 5 | 30 days supply | Qty: 30 | Fill #0

## 2017-10-07 MED FILL — ALLOPURINOL 100 MG TABLET: 100 | 30 days supply | Qty: 60 | Fill #0

## 2017-10-09 ENCOUNTER — Ambulatory Visit (HOSPITAL_COMMUNITY)
Admission: RE | Admit: 2017-10-09 | Discharge: 2017-10-09 | Disposition: A | Discharge status: Home / Self Care | Payer: 59 | Source: Ambulatory Visit | Attending: Nurse Practitioner | Admitting: Nurse Practitioner

## 2017-10-09 VITALS — BP 108/74 | HR 109

## 2017-10-09 DIAGNOSIS — I422 Other hypertrophic cardiomyopathy: Secondary | ICD-10-CM | POA: Insufficient documentation

## 2017-10-09 DIAGNOSIS — I739 Peripheral vascular disease, unspecified: Secondary | ICD-10-CM | POA: Insufficient documentation

## 2017-10-09 DIAGNOSIS — Z940 Kidney transplant status: Secondary | ICD-10-CM | POA: Insufficient documentation

## 2017-10-09 DIAGNOSIS — Z8673 Personal history of transient ischemic attack (TIA), and cerebral infarction without residual deficits: Secondary | ICD-10-CM | POA: Insufficient documentation

## 2017-10-09 DIAGNOSIS — I4892 Unspecified atrial flutter: Secondary | ICD-10-CM | POA: Diagnosis not present

## 2017-10-09 DIAGNOSIS — E785 Hyperlipidemia, unspecified: Secondary | ICD-10-CM | POA: Diagnosis not present

## 2017-10-09 DIAGNOSIS — Z880 Allergy status to penicillin: Secondary | ICD-10-CM | POA: Diagnosis not present

## 2017-10-09 DIAGNOSIS — Z79899 Other long term (current) drug therapy: Secondary | ICD-10-CM | POA: Insufficient documentation

## 2017-10-09 DIAGNOSIS — E213 Hyperparathyroidism, unspecified: Secondary | ICD-10-CM | POA: Insufficient documentation

## 2017-10-09 DIAGNOSIS — Z951 Presence of aortocoronary bypass graft: Secondary | ICD-10-CM | POA: Diagnosis not present

## 2017-10-09 DIAGNOSIS — M109 Gout, unspecified: Secondary | ICD-10-CM | POA: Diagnosis not present

## 2017-10-09 DIAGNOSIS — N189 Chronic kidney disease, unspecified: Secondary | ICD-10-CM | POA: Insufficient documentation

## 2017-10-09 DIAGNOSIS — Z7952 Long term (current) use of systemic steroids: Secondary | ICD-10-CM | POA: Diagnosis not present

## 2017-10-09 DIAGNOSIS — Z7901 Long term (current) use of anticoagulants: Secondary | ICD-10-CM | POA: Insufficient documentation

## 2017-10-09 DIAGNOSIS — Z87891 Personal history of nicotine dependence: Secondary | ICD-10-CM | POA: Insufficient documentation

## 2017-10-09 DIAGNOSIS — Z9889 Other specified postprocedural states: Secondary | ICD-10-CM | POA: Diagnosis not present

## 2017-10-09 DIAGNOSIS — I129 Hypertensive chronic kidney disease with stage 1 through stage 4 chronic kidney disease, or unspecified chronic kidney disease: Secondary | ICD-10-CM | POA: Insufficient documentation

## 2017-10-09 DIAGNOSIS — I2510 Atherosclerotic heart disease of native coronary artery without angina pectoris: Secondary | ICD-10-CM | POA: Diagnosis not present

## 2017-10-09 DIAGNOSIS — I480 Paroxysmal atrial fibrillation: Secondary | ICD-10-CM | POA: Insufficient documentation

## 2017-10-09 LAB — BASIC METABOLIC PANEL
Anion gap: 5 (ref 5–15)
BUN: 26 mg/dL — AB (ref 6–20)
CHLORIDE: 105 mmol/L (ref 101–111)
CO2: 26 mmol/L (ref 22–32)
CREATININE: 1.73 mg/dL — AB (ref 0.61–1.24)
Calcium: 9.7 mg/dL (ref 8.9–10.3)
GFR calc non Af Amer: 43 mL/min — ABNORMAL LOW (ref >60)
GFR, EST AFRICAN AMERICAN: 50 mL/min — AB (ref >60)
Glucose, Bld: 117 mg/dL — ABNORMAL HIGH (ref 65–99)
POTASSIUM: 4.2 mmol/L (ref 3.5–5.1)
SODIUM: 136 mmol/L (ref 135–145)

## 2017-10-09 LAB — CBC
HEMATOCRIT: 54.9 % — AB (ref 39.0–52.0)
HEMOGLOBIN: 18.1 g/dL — AB (ref 13.0–17.0)
MCH: 28.7 pg (ref 26.0–34.0)
MCHC: 33 g/dL (ref 30.0–36.0)
MCV: 87.1 fL (ref 78.0–100.0)
Platelets: 175 10*3/uL (ref 150–400)
RBC: 6.3 MIL/uL — AB (ref 4.22–5.81)
RDW: 15.4 % (ref 11.5–15.5)
WBC: 7.2 10*3/uL (ref 4.0–10.5)

## 2017-10-09 NOTE — Progress Notes (Signed)
Primary Care Physician: Patient, No Pcp Per Referring Physician: Dr. Everlene Farrier Kaupp is a 56 y.o. male with a h/o recent afib ablation. After procedure pt had  symptomatic arrhythmias. He was seen by Dr. Rayann Heman and started on 200 mg bid. In the clinic today, he feels much better but remains in rate controlled atrial flutter.  F/u in afib clinic, pt has now been loading on amiodarone x one month and will be set up for cardioversion as he remains in atrial flutter. He has not missed any doses of Eliquis.  Today, he denies symptoms of palpitations, chest pain, shortness of breath, orthopnea, PND, lower extremity edema, dizziness, presyncope, syncope, or neurologic sequela. The patient is tolerating medications without difficulties and is otherwise without complaint today.   Past Medical History:  Diagnosis Date  . CAD (coronary artery disease)    a. s/p CABG 03/2012: LIMA to LAD, free RIMA to OM1, SVG to D1, sequential SVG to AM and LPLB2, EVH via right thigh and leg.  . Cellulitis 04/13/2012   RLE saphenous vein harvest incision   . CKD (chronic kidney disease)   . Gout   . History of kidney transplant   . Hyperlipidemia   . Hyperparathyroidism   . Hypertension   . Hypertrophic cardiomyopathy ([place])   . Migraines   . PAF and Flutter    a. Afib post-op CABG; AFlutter 10/2012  . Peripheral vascular disease ([place])   . Sinus node dysfunction-post termination pause    a. >8sec in setting of aflutter 10/2012  . Stroke [facility]) 1995; 2001   denies residual  . Superficial vein thrombosis    a. R greater saphenous 04/2012   Past Surgical History:  Procedure Laterality Date  . AV FISTULA PLACEMENT  2011   left forearm  . AV FISTULA PLACEMENT    . CARDIAC CATHETERIZATION  03/16/12  . DENTAL SURGERY  2008   multiple  . [place]   left  . FRACTURE SURGERY    . HERNIA REPAIR  22/0254   umbilical  . INGUINAL HERNIA REPAIR  09/2009   bilaterally  . KIDNEY  TRANSPLANT    . RENAL ARTERY STENT  2001    Current Outpatient Medications  Medication Sig Dispense Refill  . allopurinol (ZYLOPRIM) 100 MG tablet Take 200 mg by mouth daily.     Marland Kitchen amiodarone (PACERONE) 200 MG tablet Take 1 tablet (200 mg total) by mouth 2 (two) times daily. 180 tablet 11  . cloNIDine (CATAPRES) 0.2 MG tablet Take 0.2 mg by mouth at bedtime.     Marland Kitchen diltiazem (CARDIZEM) 60 MG tablet Take 1 tablet as needed every 6 hours for rapid afib HR over 100.    Marland Kitchen ELIQUIS 5 MG TABS tablet TAKE 1 TABLET BY MOUTH 2 TIMES DAILY. 60 tablet 10  . fenofibrate micronized (LOFIBRA) 134 MG capsule Take 134 mg by mouth daily.     . magnesium oxide (MAG-OX) 400 (241.3 Mg) MG tablet Take 400 mg by mouth 2 (two) times daily.    . metoprolol tartrate (LOPRESSOR) 50 MG tablet Take 25-50 mg by mouth See admin instructions. Take 1 tabl (50mg ) in the morning, 1/2 tab (25mg ) in the afternoon take 1/2 tablet in the evening, and 1 tab (50mg ) at night    . mycophenolate (MYFORTIC) 180 MG EC tablet Take 720 mg by mouth 2 (two) times daily.     Marland Kitchen omega-3 acid ethyl esters (LOVAZA) 1 G capsule Take  2 g by mouth 2 (two) times daily.    Marland Kitchen omeprazole (PRILOSEC) 20 MG capsule Take 20 mg by mouth daily.  5  . potassium chloride SA (KLOR-CON M15) 15 MEQ tablet Take 15 mEq by mouth 2 (two) times daily.     . predniSONE (DELTASONE) 5 MG tablet Take 5 mg by mouth at bedtime.    . sulfamethoxazole-trimethoprim (BACTRIM,SEPTRA) 400-80 MG tablet Take 1 tablet by mouth every Monday, Wednesday, and Friday.  6  . tacrolimus (PROGRAF) 0.5 MG capsule Take 0.5 mg by mouth every evening.  2  . tacrolimus (PROGRAF) 1 MG capsule Take 1 mg by mouth every morning.   5   No current facility-administered medications for this encounter.     Allergies  Allergen Reactions  . Contrast Media [Iodinated Diagnostic Agents] Other (See Comments)    Pt states that this medication causes him to code.   Marland Kitchen Penicillins Itching, Rash and Other  (See Comments)    Has patient had a PCN reaction causing immediate rash, facial/tongue/throat swelling, SOB or lightheadedness with hypotension: No Has patient had a PCN reaction causing severe rash involving mucus membranes or skin necrosis: No Has patient had a PCN reaction that required hospitalization No Has patient had a PCN reaction occurring within the last 10 years: No If all of the above answers are "NO", then may proceed with Cephalosporin use.  Marland Kitchen Lisinopril     Increased Cr    Social History   Socioeconomic History  . Marital status: Married    Spouse name: Not on file  . Number of children: Not on file  . Years of education: Not on file  . Highest education level: Not on file  Social Needs  . Financial resource strain: Not on file  . Food insecurity - worry: Not on file  . Food insecurity - inability: Not on file  . Transportation needs - medical: Not on file  . Transportation needs - non-medical: Not on file  Occupational History  . Not on file  Tobacco Use  . Smoking status: Former Smoker    Packs/day: 0.30    Years: 30.00    Pack years: 9.00    Types: Cigarettes    Last attempt to quit: 03/15/2012    Years since quitting: 5.5  . Smokeless tobacco: Never Used  Substance and Sexual Activity  . Alcohol use: Yes    Comment: 04/13/12 "2 mixed drinks/month"  . Drug use: No  . Sexual activity: Yes  Other Topics Concern  . Not on file  Social History Narrative  . Not on file    Family History  Adopted: Yes  Problem Relation Age of Onset  . Hypertension Other     ROS- All systems are reviewed and negative except as per the HPI above  Physical Exam: Vitals:   10/09/17 1509  BP: 108/74  Pulse: (!) 109   Wt Readings from Last 3 Encounters:  09/25/17 194 lb 3.2 oz (88.1 kg)  09/12/17 193 lb 3.2 oz (87.6 kg)  08/22/17 196 lb 6.4 oz (89.1 kg)    Labs: Lab Results  Component Value Date   NA 136 10/09/2017   K 4.2 10/09/2017   CL 105 10/09/2017    CO2 26 10/09/2017   GLUCOSE 117 (H) 10/09/2017   BUN 26 (H) 10/09/2017   CREATININE 1.73 (H) 10/09/2017   CALCIUM 9.7 10/09/2017   PHOS 6.9 (H) 04/16/2012   MG 1.7 06/14/2017   Lab Results  Component Value Date  INR 1.07 03/01/2017   No results found for: CHOL, HDL, LDLCALC, TRIG   GEN- The patient is well appearing, alert and oriented x 3 today.   Head- normocephalic, atraumatic Eyes-  Sclera clear, conjunctiva pink Ears- hearing intact Oropharynx- clear Neck- supple, no JVP Lymph- no cervical lymphadenopathy Lungs- Clear to ausculation bilaterally, normal work of breathing Heart- irregular rate and rhythm, no murmurs, rubs or gallops, PMI not laterally displaced GI- soft, NT, ND, + BS Extremities- no clubbing, cyanosis, or edema MS- no significant deformity or atrophy Skin- no rash or lesion Psych- euthymic mood, full affect Neuro- strength and sensation are intact  EKG- atrial flutter at 109 bpm, qrs int 132 ms, qtc 444 ms Epic records reviewed    Assessment and Plan: 1. H/o afib  S/p ablation Atrial flutter s/p ablation Feels much improved on amiodarone 200 mg bid but remains in flutter Now loading on amiodarone x 4 weeks,  will set up for cardioversion Continue amiodarone at 200 mg bid x one week after cardioversion and then go down to 200 mg a day He is on eliquis 5 mg bid without missed doses and reminded not to miss any doses  F/u in afib clinic in one week after cardioversion  Butch Penny C. Sun Wilensky, [facility] [address] [place], [place] [address] [phone]

## 2017-10-09 NOTE — Patient Instructions (Signed)
Cardioversion scheduled for Monday, November 12th  - Arrive at the [facility] and go to admitting at 9:30AM  -Do not eat or drink anything after midnight the night prior to your procedure.  - Take all your medication with a sip of water prior to arrival.  - You will not be able to drive home after your procedure.   Decrease Amiodarone to 200mg  once a day 1 week after cardioversion.

## 2017-10-09 NOTE — Progress Notes (Signed)
Pt in for repeat EKG.  To be reviewed by Donna Carroll, NP 

## 2017-10-10 ENCOUNTER — Encounter (INDEPENDENT_AMBULATORY_CARE_PROVIDER_SITE_OTHER): Payer: Self-pay

## 2017-10-10 MED FILL — TADALAFIL 5 MG TABS: 5 | 30 days supply | Qty: 30 | Fill #8

## 2017-10-10 MED FILL — MYCOPHENOLIC ACID DR 180 MG: 180 | 30 days supply | Qty: 240 | Fill #1

## 2017-10-13 ENCOUNTER — Encounter (HOSPITAL_COMMUNITY)
Admission: RE | Disposition: A | Discharge status: Home / Self Care | Payer: Self-pay | Source: Ambulatory Visit | Attending: Cardiology

## 2017-10-13 ENCOUNTER — Ambulatory Visit (HOSPITAL_COMMUNITY)
Admission: RE | Admit: 2017-10-13 | Discharge: 2017-10-13 | Disposition: A | Discharge status: Home / Self Care | Payer: 59 | Source: Ambulatory Visit | Attending: Cardiology | Admitting: Cardiology

## 2017-10-13 ENCOUNTER — Ambulatory Visit (HOSPITAL_COMMUNITY)
Admission: RE | Admit: 2017-10-13 | Discharge: 2017-10-13 | Disposition: A | Discharge status: Home / Self Care | Payer: 59 | Source: Ambulatory Visit | Attending: Nurse Practitioner | Admitting: Nurse Practitioner

## 2017-10-13 ENCOUNTER — Other Ambulatory Visit: Payer: Self-pay

## 2017-10-13 ENCOUNTER — Ambulatory Visit (HOSPITAL_COMMUNITY): Payer: 59 | Admitting: Certified Registered"

## 2017-10-13 ENCOUNTER — Encounter (HOSPITAL_COMMUNITY): Payer: Self-pay | Admitting: *Deleted

## 2017-10-13 DIAGNOSIS — I4892 Unspecified atrial flutter: Secondary | ICD-10-CM | POA: Diagnosis not present

## 2017-10-13 DIAGNOSIS — I471 Supraventricular tachycardia: Secondary | ICD-10-CM | POA: Diagnosis not present

## 2017-10-13 DIAGNOSIS — I2510 Atherosclerotic heart disease of native coronary artery without angina pectoris: Secondary | ICD-10-CM | POA: Insufficient documentation

## 2017-10-13 DIAGNOSIS — I482 Chronic atrial fibrillation: Secondary | ICD-10-CM | POA: Diagnosis not present

## 2017-10-13 DIAGNOSIS — Z87891 Personal history of nicotine dependence: Secondary | ICD-10-CM | POA: Diagnosis not present

## 2017-10-13 DIAGNOSIS — I10 Essential (primary) hypertension: Secondary | ICD-10-CM | POA: Diagnosis not present

## 2017-10-13 DIAGNOSIS — I119 Hypertensive heart disease without heart failure: Secondary | ICD-10-CM | POA: Diagnosis not present

## 2017-10-13 DIAGNOSIS — I739 Peripheral vascular disease, unspecified: Secondary | ICD-10-CM | POA: Diagnosis not present

## 2017-10-13 DIAGNOSIS — I4891 Unspecified atrial fibrillation: Secondary | ICD-10-CM | POA: Insufficient documentation

## 2017-10-13 HISTORY — PX: CARDIOVERSION: SHX1299

## 2017-10-13 SURGERY — CARDIOVERSION
Anesthesia: General

## 2017-10-13 MED ORDER — SODIUM CHLORIDE 0.9 % IV SOLN
INTRAVENOUS | Status: AC | PRN
  Administered 2017-10-13: 500 mL via INTRAVENOUS

## 2017-10-13 MED ORDER — LIDOCAINE 2% (20 MG/ML) 5 ML SYRINGE
INTRAMUSCULAR | Status: DC | PRN
  Administered 2017-10-13: 60 mg via INTRAVENOUS

## 2017-10-13 MED ORDER — PROPOFOL 10 MG/ML IV BOLUS
INTRAVENOUS | Status: DC | PRN
  Administered 2017-10-13: 100 mg via INTRAVENOUS

## 2017-10-13 NOTE — CV Procedure (Signed)
    Cardioversion Note  [name] [id number] [birth year]  Procedure: DC Cardioversion Indications: atrial fibrillation  Procedure Details Consent: Obtained Time Out: Verified patient identification, verified procedure, site/side was marked, verified correct patient position, special equipment/implants available, Radiology Safety Procedures followed,  medications/allergies/relevent history reviewed, required imaging and test results available.  Performed  The patient has been on adequate anticoagulation.  The patient received IV propofol administered by anesthesia staff for sedation.  Synchronous cardioversion was performed at 120 joules.  The cardioversion was successful.   Complications: No apparent complications Patient did tolerate procedure well.   Steven Dawley, MD, [facility] 10/13/2017, 11:12 AM

## 2017-10-13 NOTE — Discharge Instructions (Signed)
Electrical Cardioversion, Care After °This sheet gives you information about how to care for yourself after your procedure. Your health care provider may also give you more specific instructions. If you have problems or questions, contact your health care provider. °What can I expect after the procedure? °After the procedure, it is common to have: °· Some redness on the skin where the shocks were given. ° °Follow these instructions at home: °· Do not drive for 24 hours if you were given a medicine to help you relax (sedative). °· Take over-the-counter and prescription medicines only as told by your health care provider. °· Ask your health care provider how to check your pulse. Check it often. °· Rest for 48 hours after the procedure or as told by your health care provider. °· Avoid or limit your caffeine use as told by your health care provider. °Contact a health care provider if: °· You feel like your heart is beating too quickly or your pulse is not regular. °· You have a serious muscle cramp that does not go away. °Get help right away if: °· You have discomfort in your chest. °· You are dizzy or you feel faint. °· You have trouble breathing or you are short of breath. °· Your speech is slurred. °· You have trouble moving an arm or leg on one side of your body. °· Your fingers or toes turn cold or blue. °This information is not intended to replace advice given to you by your health care provider. Make sure you discuss any questions you have with your health care provider. °Document Released: 09/08/2013 Document Revised: 06/21/2016 Document Reviewed: 05/24/2016 °Elsevier Interactive Patient Education © 2018 Elsevier Inc. ° °

## 2017-10-13 NOTE — Progress Notes (Addendum)
Pt in for EKG before DCCV.  To be reviewed by Ceasar Lund   Pt is set up for cardioversion today, wanted  to confirn he was still out of rhythm.  EKG shows atrial flutter with v rate of 94 bpm, QRS int 132 ms, qtc 507 ms . To DCCV today as planned.

## 2017-10-13 NOTE — Anesthesia Preprocedure Evaluation (Signed)
Anesthesia Evaluation  Patient identified by MRN, date of birth, ID band Patient awake    Airway Mallampati: II   Neck ROM: Full    Dental no notable dental hx.    Pulmonary former smoker,    breath sounds clear to auscultation       Cardiovascular hypertension, + CAD and + Peripheral Vascular Disease   Rhythm:Regular Rate:Normal     Neuro/Psych    GI/Hepatic   Endo/Other    Renal/GU Renal diseaseRenal transpla;nt     Musculoskeletal   Abdominal (+) - obese,   Peds  Hematology   Anesthesia Other Findings   Reproductive/Obstetrics                             Anesthesia Physical Anesthesia Plan  ASA: III  Anesthesia Plan: General   Post-op Pain Management:    Induction: Intravenous  PONV Risk Score and Plan: 2 and Treatment may vary due to age or medical condition  Airway Management Planned: Mask  Additional Equipment:   Intra-op Plan:   Post-operative Plan:   Informed Consent: I have reviewed the patients History and Physical, chart, labs and discussed the procedure including the risks, benefits and alternatives for the proposed anesthesia with the patient or authorized representative who has indicated his/her understanding and acceptance.     Plan Discussed with: CRNA  Anesthesia Plan Comments:         Anesthesia Quick Evaluation

## 2017-10-13 NOTE — H&P (View-Only) (Signed)
Pt in for EKG before DCCV.  To be reviewed by Ceasar Lund   Pt is set up for cardioversion today, wanted  to confirn he was still out of rhythm.  EKG shows atrial flutter with v rate of 94 bpm, QRS int 132 ms, qtc 507 ms . To DCCV today as planned.

## 2017-10-13 NOTE — Anesthesia Postprocedure Evaluation (Signed)
Anesthesia Post Note  Patient: [name]  Procedure(s) Performed: CARDIOVERSION (N/A )     Patient location during evaluation: Endoscopy Anesthesia Type: General Level of consciousness: awake and alert Pain management: pain level controlled Vital Signs Assessment: post-procedure vital signs reviewed and stable Respiratory status: spontaneous breathing, nonlabored ventilation, respiratory function stable and patient connected to nasal cannula oxygen Cardiovascular status: blood pressure returned to baseline and stable Postop Assessment: no apparent nausea or vomiting Anesthetic complications: no    Last Vitals:  Vitals:   10/13/17 1125 10/13/17 1140  BP: 101/66 107/82  Pulse: (!) 51 (!) 55  Resp: 18 20  Temp:    SpO2: 99% 100%    Last Pain:  Vitals:   10/13/17 1115  TempSrc: Oral                 Margie Urbanowicz,JAMES TERRILL

## 2017-10-13 NOTE — Transfer of Care (Signed)
Immediate Anesthesia Transfer of Care Note  Patient: Donivin Wirt Vessell  Procedure(s) Performed: CARDIOVERSION (N/A )  Patient Location: Endoscopy Unit  Anesthesia Type:General  Level of Consciousness: sedated and patient cooperative  Airway & Oxygen Therapy: Patient Spontanous Breathing and Patient connected to nasal cannula oxygen  Post-op Assessment: Report given to RN, Post -op Vital signs reviewed and stable and Patient moving all extremities  Post vital signs: Reviewed and stable  Last Vitals:  Vitals:   10/13/17 0938  BP: (!) 137/98  Resp: 15  Temp: 36.7 C  SpO2: 97%    Last Pain:  Vitals:   10/13/17 0938  TempSrc: Oral         Complications: No apparent anesthesia complications

## 2017-10-13 NOTE — Interval H&P Note (Signed)
History and Physical Interval Note:  10/13/2017 10:12 AM  [name]  has presented today for surgery, with the diagnosis of AFLUTTER  The various methods of treatment have been discussed with the patient and family. After consideration of risks, benefits and other options for treatment, the patient has consented to  Procedure(s): CARDIOVERSION (N/A) as a surgical intervention .  The patient's history has been reviewed, patient examined, no change in status, stable for surgery.  I have reviewed the patient's chart and labs.  Questions were answered to the patient's satisfaction.     [name]

## 2017-10-15 ENCOUNTER — Encounter (HOSPITAL_COMMUNITY): Payer: Self-pay | Admitting: Cardiology

## 2017-10-15 ENCOUNTER — Other Ambulatory Visit (HOSPITAL_COMMUNITY): Payer: Self-pay | Admitting: *Deleted

## 2017-10-16 ENCOUNTER — Ambulatory Visit (HOSPITAL_COMMUNITY)
Admission: RE | Admit: 2017-10-16 | Discharge: 2017-10-16 | Disposition: A | Discharge status: Home / Self Care | Payer: 59 | Source: Ambulatory Visit | Attending: Nephrology | Admitting: Nephrology

## 2017-10-16 ENCOUNTER — Telehealth (HOSPITAL_COMMUNITY): Payer: Self-pay | Admitting: *Deleted

## 2017-10-16 DIAGNOSIS — D751 Secondary polycythemia: Secondary | ICD-10-CM | POA: Diagnosis not present

## 2017-10-16 DIAGNOSIS — R319 Hematuria, unspecified: Secondary | ICD-10-CM | POA: Diagnosis not present

## 2017-10-16 LAB — URINALYSIS, ROUTINE W REFLEX MICROSCOPIC
BILIRUBIN URINE: NEGATIVE
GLUCOSE, UA: NEGATIVE mg/dL
Ketones, ur: NEGATIVE mg/dL
Leukocytes, UA: NEGATIVE
Nitrite: NEGATIVE
PH: 7 (ref 5.0–8.0)
Protein, ur: 100 mg/dL — AB
SPECIFIC GRAVITY, URINE: 1.016 (ref 1.005–1.030)
Squamous Epithelial / LPF: NONE SEEN

## 2017-10-16 LAB — POCT HEMOGLOBIN-HEMACUE: Hemoglobin: 17.3 g/dL — ABNORMAL HIGH (ref 13.0–17.0)

## 2017-10-16 MED ORDER — AMIODARONE HCL 200 MG PO TABS: 200.0000 mg | ORAL_TABLET | Freq: Every day | ORAL | 11 refills | Status: DC

## 2017-10-16 MED ORDER — FUROSEMIDE 20 MG PO TABS: ORAL_TABLET | ORAL | 0 refills | Status: DC

## 2017-10-16 MED FILL — FUROSEMIDE 20 MG TABS: 20 | 10 days supply | Qty: 10 | Fill #0

## 2017-10-16 NOTE — Progress Notes (Signed)
Pt here for therapeutic phlebotomy.  HGB 17.3 via hemocue.  Phlebotomy performed via right antecubital with 16g needle without difficulty.  Obtained approximately 500cc blood.  Pt tolerated procedure without difficulty.  Will monitor until discharge

## 2017-10-16 NOTE — Telephone Encounter (Signed)
Pt called in stating he is up about 3lbs since cardioversion, short of breath and HR is running in the 50s so feeling sluggish. Discussed with Roderic Palau, NP will decrease amiodarone to 200mg  once a day and take lasix 20mg  for the next 3 days. Pt has follow up on Monday.

## 2017-10-20 ENCOUNTER — Ambulatory Visit (HOSPITAL_COMMUNITY)
Admission: RE | Admit: 2017-10-20 | Discharge: 2017-10-20 | Disposition: A | Discharge status: Home / Self Care | Payer: 59 | Source: Ambulatory Visit | Attending: Nurse Practitioner | Admitting: Nurse Practitioner

## 2017-10-20 DIAGNOSIS — R001 Bradycardia, unspecified: Secondary | ICD-10-CM | POA: Insufficient documentation

## 2017-10-20 DIAGNOSIS — R0602 Shortness of breath: Secondary | ICD-10-CM | POA: Diagnosis not present

## 2017-10-20 NOTE — Progress Notes (Signed)
Pt in for f/u  Cardioversion. He is in SR. He called after cardioversion and said he felt short of breath. Amiodarone was reduced to 200 mg qd and he was given lasix x 3 days. Today, he is feeling much better. Sinus brady at 52 bpm, pr int 176 ms, qtc 461 ms. F/u with Dr. Rayann Heman 12/19.

## 2017-10-21 DIAGNOSIS — Z7689 Persons encountering health services in other specified circumstances: Secondary | ICD-10-CM | POA: Diagnosis not present

## 2017-10-21 DIAGNOSIS — Z940 Kidney transplant status: Secondary | ICD-10-CM | POA: Diagnosis not present

## 2017-10-21 DIAGNOSIS — I129 Hypertensive chronic kidney disease with stage 1 through stage 4 chronic kidney disease, or unspecified chronic kidney disease: Secondary | ICD-10-CM | POA: Diagnosis not present

## 2017-10-24 MED FILL — TACROLIMUS 1 MG CAPSULE: 1 | 30 days supply | Qty: 60 | Fill #5

## 2017-10-24 MED FILL — METOPROLOL TARTRATE 25 MG T: 25 | 30 days supply | Qty: 60 | Fill #4

## 2017-10-24 MED FILL — SULFAMETHOXAZOLE/TMP SS TAB: 400-80 | 28 days supply | Qty: 12 | Fill #5

## 2017-10-24 MED FILL — TACROLIMUS 0.5 MG CAPSULE: 0.5 | 30 days supply | Qty: 30 | Fill #1

## 2017-10-24 MED FILL — ELIQUIS 5 MG TABLET: 5 | 30 days supply | Qty: 60 | Fill #2

## 2017-10-28 DIAGNOSIS — Z940 Kidney transplant status: Secondary | ICD-10-CM | POA: Diagnosis not present

## 2017-10-29 DIAGNOSIS — D751 Secondary polycythemia: Secondary | ICD-10-CM | POA: Diagnosis not present

## 2017-10-29 DIAGNOSIS — E559 Vitamin D deficiency, unspecified: Secondary | ICD-10-CM | POA: Diagnosis not present

## 2017-10-29 DIAGNOSIS — I129 Hypertensive chronic kidney disease with stage 1 through stage 4 chronic kidney disease, or unspecified chronic kidney disease: Secondary | ICD-10-CM | POA: Diagnosis not present

## 2017-10-29 DIAGNOSIS — E785 Hyperlipidemia, unspecified: Secondary | ICD-10-CM | POA: Diagnosis not present

## 2017-10-29 DIAGNOSIS — Z940 Kidney transplant status: Secondary | ICD-10-CM | POA: Diagnosis not present

## 2017-10-29 DIAGNOSIS — E669 Obesity, unspecified: Secondary | ICD-10-CM | POA: Diagnosis not present

## 2017-10-29 DIAGNOSIS — I2510 Atherosclerotic heart disease of native coronary artery without angina pectoris: Secondary | ICD-10-CM | POA: Diagnosis not present

## 2017-10-29 DIAGNOSIS — I421 Obstructive hypertrophic cardiomyopathy: Secondary | ICD-10-CM | POA: Diagnosis not present

## 2017-10-29 DIAGNOSIS — I4891 Unspecified atrial fibrillation: Secondary | ICD-10-CM | POA: Diagnosis not present

## 2017-10-31 MED FILL — FENOFIBRATE 134 MG CAPSULE: 134 | 30 days supply | Qty: 30 | Fill #2

## 2017-10-31 MED FILL — ALLOPURINOL 100 MG TABLET: 100 | 30 days supply | Qty: 60 | Fill #1

## 2017-10-31 MED FILL — POTASSIUM CITRATE ER 15 MEQ: 15 MEQ | 30 days supply | Qty: 120 | Fill #4

## 2017-10-31 MED FILL — OMEGA-3 ETHYL ESTERS 1 GM C: 1 | 90 days supply | Qty: 360 | Fill #2

## 2017-11-07 MED FILL — cloNIDine HCL 0.2 MG TABS: 0.2 | 90 days supply | Qty: 90 | Fill #1

## 2017-11-07 MED FILL — OMEPRAZOLE 20 MG CAP: 20 | 30 days supply | Qty: 30 | Fill #2

## 2017-11-07 MED FILL — MYCOPHENOLIC ACID DR 180 MG: 180 | 30 days supply | Qty: 240 | Fill #2

## 2017-11-14 ENCOUNTER — Encounter: Payer: Self-pay | Admitting: Internal Medicine

## 2017-11-14 MED FILL — predniSONE 5 MG TABS: 5 | 30 days supply | Qty: 30 | Fill #1

## 2017-11-18 DIAGNOSIS — I129 Hypertensive chronic kidney disease with stage 1 through stage 4 chronic kidney disease, or unspecified chronic kidney disease: Secondary | ICD-10-CM | POA: Diagnosis not present

## 2017-11-18 DIAGNOSIS — E785 Hyperlipidemia, unspecified: Secondary | ICD-10-CM | POA: Diagnosis not present

## 2017-11-18 DIAGNOSIS — Z7689 Persons encountering health services in other specified circumstances: Secondary | ICD-10-CM | POA: Diagnosis not present

## 2017-11-18 DIAGNOSIS — Z940 Kidney transplant status: Secondary | ICD-10-CM | POA: Diagnosis not present

## 2017-11-18 MED FILL — ELIQUIS 5 MG TABLET: 5 | 30 days supply | Qty: 60 | Fill #3

## 2017-11-18 MED FILL — SULFAMETHOXAZOLE/TMP SS TAB: 400-80 | 28 days supply | Qty: 12 | Fill #6

## 2017-11-18 MED FILL — TACROLIMUS 0.5 MG CAPSULE: 0.5 | 30 days supply | Qty: 30 | Fill #2

## 2017-11-18 MED FILL — METOPROLOL TARTRATE 25 MG T: 25 | 30 days supply | Qty: 60 | Fill #5

## 2017-11-19 ENCOUNTER — Encounter: Payer: Self-pay | Admitting: Internal Medicine

## 2017-11-19 ENCOUNTER — Ambulatory Visit (INDEPENDENT_AMBULATORY_CARE_PROVIDER_SITE_OTHER): Payer: 59 | Admitting: Internal Medicine

## 2017-11-19 VITALS — BP 116/80 | HR 50 | Ht 68.0 in | Wt 198.4 lb

## 2017-11-19 DIAGNOSIS — I10 Essential (primary) hypertension: Secondary | ICD-10-CM

## 2017-11-19 DIAGNOSIS — I422 Other hypertrophic cardiomyopathy: Secondary | ICD-10-CM | POA: Diagnosis not present

## 2017-11-19 DIAGNOSIS — I484 Atypical atrial flutter: Secondary | ICD-10-CM

## 2017-11-19 DIAGNOSIS — I481 Persistent atrial fibrillation: Secondary | ICD-10-CM | POA: Diagnosis not present

## 2017-11-19 DIAGNOSIS — I4819 Other persistent atrial fibrillation: Secondary | ICD-10-CM

## 2017-11-19 MED FILL — TACROLIMUS 1 MG CAPSULE: 1 | 30 days supply | Qty: 30 | Fill #0

## 2017-11-19 NOTE — Patient Instructions (Signed)

## 2017-11-19 NOTE — Progress Notes (Signed)
PCP: Patient, No Pcp Per   Primary EP: Dr Barbette Merino Cerasoli is a 56 y.o. male who presents today for routine electrophysiology followup.  Since last being seen in our clinic, the patient reports doing very well.   He did have some ERAF which has improved. He feels "great" since his cardioversion.  Today, he denies symptoms of palpitations, chest pain, shortness of breath,  lower extremity edema, dizziness, presyncope, or syncope.  The patient is otherwise without complaint today.   Past Medical History:  Diagnosis Date  . Arteriovenous fistula, acquired ([place])   . Atrial fibrillation and flutter ([place])   . Bladder stones   . BMI 31.0-31.9,adult   . CAD (coronary artery disease)    a. s/p CABG 03/2012: LIMA to LAD, free RIMA to OM1, SVG to D1, sequential SVG to AM and LPLB2, EVH via right thigh and leg.  . Cellulitis 04/13/2012   RLE saphenous vein harvest incision   . CKD (chronic kidney disease)   . Dyslipidemia   . Gout   . History of kidney transplant   . Hyperglycemia   . Hyperlipidemia   . Hyperparathyroidism   . Hypertension   . Hypertrophic cardiomyopathy ([place])   . Hypomagnesemia   . Immunosuppression ([place])   . Intermittent palpitations   . Microscopic hematuria   . Migraines   . PAF and Flutter    a. Afib post-op CABG; AFlutter 10/2012  . Peripheral vascular disease ([place])   . Post-transplant erythrocytosis   . Renal disease   . S/P living-donor kidney transplantation   . Sinus node dysfunction-post termination pause    a. >8sec in setting of aflutter 10/2012  . Sleep pattern disturbance   . Stroke [facility]) 1995; 2001   denies residual  . Superficial vein thrombosis    a. R greater saphenous 04/2012  . Vitamin D deficiency   . VT (ventricular tachycardia) ([place])    Past Surgical History:  Procedure Laterality Date  . ATRIAL FIBRILLATION ABLATION N/A 08/21/2017   Procedure: Atrial Fibrillation Ablation;  Surgeon: [name], MD;  Location: [place] CV LAB;   Service: Cardiovascular;  Laterality: N/A;  . ATRIAL FLUTTER ABLATION N/A 11/09/2012   Procedure: ATRIAL FLUTTER ABLATION;  Surgeon: [name], MD;  Location: [facility] CATH LAB;  Service: Cardiovascular;  Laterality: N/A;  . AV FISTULA PLACEMENT  2011   left forearm  . AV FISTULA PLACEMENT    . CARDIAC CATHETERIZATION  03/16/12  . CARDIOVERSION N/A 06/10/2017   Procedure: CARDIOVERSION;  Surgeon: Pixie Casino, MD;  Location: [facility] ENDOSCOPY;  Service: Cardiovascular;  Laterality: N/A;  . CARDIOVERSION N/A 10/13/2017   Procedure: CARDIOVERSION;  Surgeon: Dorothy Spark, MD;  Location: [facility] ENDOSCOPY;  Service: Cardiovascular;  Laterality: N/A;  . CORONARY ARTERY BYPASS GRAFT  03/19/2012   Procedure: CORONARY ARTERY BYPASS GRAFTING (CABG);  Surgeon: Rexene Alberts, MD;  Location: [place];  Service: Open Heart Surgery;  Laterality: N/A;  (B) MAMMARY  . CYSTOSCOPY WITH BIOPSY N/A 08/15/2016   Procedure: CYSTOSCOPY WITH IDENTIFICATION OF RIGHT TRANSPLANTATION OF URETRAL ORFICE WITH FLUORESCEIN;  Surgeon: Carolan Clines, MD;  Location: [facility] ORS;  Service: Urology;  Laterality: N/A;  . DENTAL SURGERY  2008   multiple  . [place]   left  . FRACTURE SURGERY    . HERNIA REPAIR  65/7846   umbilical  . HOLMIUM LASER APPLICATION N/A 9/62/9528   Procedure: HOLMIUM LASER APPLICATION WITH 3 CM RIGHT BLADDER STONE;  Surgeon: Carolan Clines, MD;  Location: [facility] ORS;  Service: Urology;  Laterality: N/A;  . INGUINAL HERNIA REPAIR  09/2009   bilaterally  . KIDNEY TRANSPLANT    . LEFT HEART CATHETERIZATION WITH CORONARY ANGIOGRAM N/A 03/16/2012   Procedure: LEFT HEART CATHETERIZATION WITH CORONARY ANGIOGRAM;  Surgeon: Sherren Mocha, MD;  Location: [facility] CATH LAB;  Service: Cardiovascular;  Laterality: N/A;  . RENAL ARTERY STENT  2001  . TEE WITHOUT CARDIOVERSION N/A 08/21/2017   Procedure: TRANSESOPHAGEAL ECHOCARDIOGRAM (TEE);  Surgeon: Larey Dresser, MD;  Location: [facility] ENDOSCOPY;   Service: Cardiovascular;  Laterality: N/A;  . TRANSURETHRAL RESECTION OF BLADDER TUMOR N/A 08/15/2016   Procedure: TRANSURETHRAL BIOPSY OF RIGHT BLADDER WALL;  Surgeon: Carolan Clines, MD;  Location: [facility] ORS;  Service: Urology;  Laterality: N/A;    ROS- all systems are reviewed and negatives except as per HPI above  Current Outpatient Medications  Medication Sig Dispense Refill  . allopurinol (ZYLOPRIM) 100 MG tablet Take 200 mg by mouth daily.     Marland Kitchen amiodarone (PACERONE) 200 MG tablet Take 1 tablet (200 mg total) daily by mouth. 180 tablet 11  . cloNIDine (CATAPRES) 0.2 MG tablet Take 0.2 mg by mouth at bedtime.     Marland Kitchen diltiazem (CARDIZEM) 60 MG tablet Take 1 tablet as needed every 6 hours for rapid afib HR over 100.    Marland Kitchen ELIQUIS 5 MG TABS tablet TAKE 1 TABLET BY MOUTH 2 TIMES DAILY. 60 tablet 10  . fenofibrate micronized (LOFIBRA) 134 MG capsule Take 134 mg by mouth daily.     . furosemide (LASIX) 20 MG tablet Take 1 tablet by mouth for the next 3 days then as needed for weight gain/swelling 10 tablet 0  . magnesium oxide (MAG-OX) 400 (241.3 Mg) MG tablet Take 400 mg by mouth 2 (two) times daily.    . metoprolol tartrate (LOPRESSOR) 50 MG tablet Take 50 mg by mouth 2 (two) times daily.     . mycophenolate (MYFORTIC) 180 MG EC tablet Take 720 mg by mouth 2 (two) times daily.     Marland Kitchen omega-3 acid ethyl esters (LOVAZA) 1 G capsule Take 2 g by mouth 2 (two) times daily.    Marland Kitchen omeprazole (PRILOSEC) 20 MG capsule Take 20 mg by mouth daily.  5  . potassium chloride SA (KLOR-CON M15) 15 MEQ tablet Take 15 mEq by mouth 2 (two) times daily.     . predniSONE (DELTASONE) 5 MG tablet Take 5 mg by mouth at bedtime.    . sulfamethoxazole-trimethoprim (BACTRIM,SEPTRA) 400-80 MG tablet Take 1 tablet by mouth every Monday, Wednesday, and Friday.  6  . tacrolimus (PROGRAF) 0.5 MG capsule Take 0.5 mg by mouth every evening.  2  . tacrolimus (PROGRAF) 1 MG capsule Take 1 mg by mouth every morning.   5   No  current facility-administered medications for this visit.     Physical Exam: Vitals:   11/19/17 1501  BP: 116/80  Pulse: (!) 50  SpO2: 95%  Weight: 198 lb 6.4 oz (90 kg)  Height: 5\' 8"  (1.727 m)    GEN- The patient is well appearing, alert and oriented x 3 today.   Head- normocephalic, atraumatic Eyes-  Sclera clear, conjunctiva pink Ears- hearing intact Oropharynx- clear Lungs- Clear to ausculation bilaterally, normal work of breathing Heart- Regular rate and rhythm, no murmurs, rubs or gallops, PMI not laterally displaced GI- soft, NT, ND, + BS Extremities- no clubbing, cyanosis, or edema  EKG tracing ordered today is personally  reviewed and shows sinus bradycardia 50 bpm, LAHB, Qtc 486 msec  Assessment and Plan:  1. Afib/ atypical atrial flutter Improved post ablation On amiodarone due to ERAF.  We discussed weaning amiodarone to 100mg  daily after the holidays vs continuing on current dose.  He will think about this and decide.  We also discussed possibly reducing metoprolol to 25mg  BID, though no formal changes were made today. He has severe LA enlargement On eliquis  2. HCM Stable No change required today  3. CRI  S/p renal transplant  4. HTN Stable No change required today  5. HL He wishes to not restart statin at this time  [name] MD, [facility] 11/19/2017 3:20 PM

## 2017-11-28 DIAGNOSIS — Z940 Kidney transplant status: Secondary | ICD-10-CM | POA: Diagnosis not present

## 2017-11-28 MED FILL — FENOFIBRATE 134 MG CAPSULE: 134 | 30 days supply | Qty: 30 | Fill #3

## 2017-11-28 MED FILL — ALLOPURINOL 100 MG TABLET: 100 | 30 days supply | Qty: 60 | Fill #2

## 2017-11-28 MED FILL — AMIODARONE HCL 200 MG TAB: 200 | 90 days supply | Qty: 180 | Fill #1

## 2017-12-05 MED FILL — MYCOPHENOLIC ACID DR 180 MG: 180 | 30 days supply | Qty: 240 | Fill #3

## 2017-12-05 MED FILL — OMEPRAZOLE 20 MG CAP: 20 | 30 days supply | Qty: 30 | Fill #3

## 2017-12-16 DIAGNOSIS — I129 Hypertensive chronic kidney disease with stage 1 through stage 4 chronic kidney disease, or unspecified chronic kidney disease: Secondary | ICD-10-CM | POA: Diagnosis not present

## 2017-12-16 DIAGNOSIS — Z7689 Persons encountering health services in other specified circumstances: Secondary | ICD-10-CM | POA: Diagnosis not present

## 2017-12-18 DIAGNOSIS — I4892 Unspecified atrial flutter: Secondary | ICD-10-CM | POA: Diagnosis not present

## 2017-12-18 DIAGNOSIS — I421 Obstructive hypertrophic cardiomyopathy: Secondary | ICD-10-CM | POA: Diagnosis not present

## 2017-12-18 DIAGNOSIS — D751 Secondary polycythemia: Secondary | ICD-10-CM | POA: Diagnosis not present

## 2017-12-18 DIAGNOSIS — E785 Hyperlipidemia, unspecified: Secondary | ICD-10-CM | POA: Diagnosis not present

## 2017-12-18 DIAGNOSIS — Z940 Kidney transplant status: Secondary | ICD-10-CM | POA: Diagnosis not present

## 2017-12-18 DIAGNOSIS — I129 Hypertensive chronic kidney disease with stage 1 through stage 4 chronic kidney disease, or unspecified chronic kidney disease: Secondary | ICD-10-CM | POA: Diagnosis not present

## 2017-12-18 DIAGNOSIS — E559 Vitamin D deficiency, unspecified: Secondary | ICD-10-CM | POA: Diagnosis not present

## 2017-12-18 DIAGNOSIS — I4891 Unspecified atrial fibrillation: Secondary | ICD-10-CM | POA: Diagnosis not present

## 2017-12-18 DIAGNOSIS — I2510 Atherosclerotic heart disease of native coronary artery without angina pectoris: Secondary | ICD-10-CM | POA: Diagnosis not present

## 2017-12-18 MED FILL — TACROLIMUS 0.5 MG CAPSULE: 0.5 | 30 days supply | Qty: 90 | Fill #0

## 2017-12-19 MED FILL — predniSONE 5 MG TABS: 5 | 30 days supply | Qty: 30 | Fill #2

## 2017-12-22 DIAGNOSIS — Z940 Kidney transplant status: Secondary | ICD-10-CM | POA: Diagnosis not present

## 2017-12-22 DIAGNOSIS — N400 Enlarged prostate without lower urinary tract symptoms: Secondary | ICD-10-CM | POA: Diagnosis not present

## 2017-12-22 DIAGNOSIS — N5201 Erectile dysfunction due to arterial insufficiency: Secondary | ICD-10-CM | POA: Diagnosis not present

## 2017-12-22 DIAGNOSIS — N189 Chronic kidney disease, unspecified: Secondary | ICD-10-CM | POA: Diagnosis not present

## 2017-12-22 DIAGNOSIS — N210 Calculus in bladder: Secondary | ICD-10-CM | POA: Diagnosis not present

## 2017-12-22 MED FILL — TADALAFIL 5 MG TABS: 5 | 30 days supply | Qty: 30 | Fill #0

## 2017-12-24 MED FILL — SULFAMETHOXAZOLE/TMP SS TAB: 400-80 | 28 days supply | Qty: 12 | Fill #0

## 2017-12-26 MED FILL — ELIQUIS 5 MG TABLET: 5 | 30 days supply | Qty: 60 | Fill #4

## 2018-01-02 MED FILL — OMEPRAZOLE 20 MG CAP: 20 | 30 days supply | Qty: 30 | Fill #4

## 2018-01-02 MED FILL — ALLOPURINOL 100 MG TABLET: 100 | 30 days supply | Qty: 60 | Fill #3

## 2018-01-02 MED FILL — MYCOPHENOLIC ACID DR 180 MG: 180 | 30 days supply | Qty: 240 | Fill #4

## 2018-01-09 MED FILL — METOPROLOL TARTRATE 25 MG T: 25 | 90 days supply | Qty: 90 | Fill #0

## 2018-01-12 MED FILL — METOPROLOL TARTRATE 50 MG T: 50 | 30 days supply | Qty: 30 | Fill #0

## 2018-01-14 ENCOUNTER — Ambulatory Visit (HOSPITAL_COMMUNITY)
Admission: RE | Admit: 2018-01-14 | Discharge: 2018-01-14 | Disposition: A | Discharge status: Home / Self Care | Payer: 59 | Source: Ambulatory Visit | Attending: Urology | Admitting: Urology

## 2018-01-14 ENCOUNTER — Other Ambulatory Visit (HOSPITAL_COMMUNITY): Payer: Self-pay | Admitting: Urology

## 2018-01-14 DIAGNOSIS — N210 Calculus in bladder: Secondary | ICD-10-CM

## 2018-01-14 DIAGNOSIS — Z09 Encounter for follow-up examination after completed treatment for conditions other than malignant neoplasm: Secondary | ICD-10-CM | POA: Diagnosis not present

## 2018-01-14 DIAGNOSIS — Z87898 Personal history of other specified conditions: Secondary | ICD-10-CM | POA: Insufficient documentation

## 2018-01-14 DIAGNOSIS — Z940 Kidney transplant status: Secondary | ICD-10-CM | POA: Insufficient documentation

## 2018-01-14 DIAGNOSIS — Z7689 Persons encountering health services in other specified circumstances: Secondary | ICD-10-CM | POA: Diagnosis not present

## 2018-01-14 DIAGNOSIS — I129 Hypertensive chronic kidney disease with stage 1 through stage 4 chronic kidney disease, or unspecified chronic kidney disease: Secondary | ICD-10-CM | POA: Diagnosis not present

## 2018-01-16 MED FILL — predniSONE 5 MG TABS: 5 | 30 days supply | Qty: 30 | Fill #3

## 2018-01-21 ENCOUNTER — Other Ambulatory Visit (HOSPITAL_COMMUNITY): Payer: Self-pay | Admitting: *Deleted

## 2018-01-22 ENCOUNTER — Encounter (HOSPITAL_COMMUNITY)
Admission: RE | Admit: 2018-01-22 | Discharge: 2018-01-22 | Disposition: A | Discharge status: Home / Self Care | Payer: 59 | Source: Ambulatory Visit | Attending: Nephrology | Admitting: Nephrology

## 2018-01-22 DIAGNOSIS — D751 Secondary polycythemia: Secondary | ICD-10-CM | POA: Insufficient documentation

## 2018-01-22 LAB — POCT HEMOGLOBIN-HEMACUE: Hemoglobin: 15.6 g/dL (ref 13.0–17.0)

## 2018-01-22 NOTE — Progress Notes (Signed)
Pre phlebotomy hgb 15.6 via hemocue.  Phlebotomy performed with 16g needle via R A/C.  Obtained 195cc of 250cc ordered.  Pt began to get diaphoretic and blood flow slowed to a stop.  Pt's feet elevated and po's offered.  He improved quickly.  Will recheck vitals prior to discharge

## 2018-01-23 MED FILL — SULFAMETHOXAZOLE/TMP SS TAB: 400-80 | 28 days supply | Qty: 12 | Fill #1

## 2018-02-02 MED FILL — ELIQUIS 5 MG TABLET: 5 | 30 days supply | Qty: 60 | Fill #5

## 2018-02-06 MED FILL — OMEGA-3 ETHYL ESTERS 1 GM C: 1 | 90 days supply | Qty: 360 | Fill #3

## 2018-02-06 MED FILL — cloNIDine HCL 0.2 MG TABS: 0.2 | 60 days supply | Qty: 60 | Fill #2

## 2018-02-06 MED FILL — MYCOPHENOLIC ACID DR 180 MG: 180 | 30 days supply | Qty: 240 | Fill #5

## 2018-02-06 MED FILL — ALLOPURINOL 100 MG TABLET: 100 | 30 days supply | Qty: 60 | Fill #4

## 2018-02-06 MED FILL — METOPROLOL TARTRATE 50 MG T: 50 | 30 days supply | Qty: 30 | Fill #1

## 2018-02-12 DIAGNOSIS — I129 Hypertensive chronic kidney disease with stage 1 through stage 4 chronic kidney disease, or unspecified chronic kidney disease: Secondary | ICD-10-CM | POA: Diagnosis not present

## 2018-02-12 DIAGNOSIS — Z7689 Persons encountering health services in other specified circumstances: Secondary | ICD-10-CM | POA: Diagnosis not present

## 2018-02-12 DIAGNOSIS — Z940 Kidney transplant status: Secondary | ICD-10-CM | POA: Diagnosis not present

## 2018-02-13 MED FILL — TADALAFIL 5 MG TABS: 5 | 30 days supply | Qty: 30 | Fill #1

## 2018-02-13 MED FILL — predniSONE 5 MG TABS: 5 | 30 days supply | Qty: 30 | Fill #4

## 2018-02-13 MED FILL — OMEPRAZOLE 20 MG CAP: 20 | 30 days supply | Qty: 30 | Fill #5

## 2018-02-17 DIAGNOSIS — E559 Vitamin D deficiency, unspecified: Secondary | ICD-10-CM | POA: Diagnosis not present

## 2018-02-17 DIAGNOSIS — I129 Hypertensive chronic kidney disease with stage 1 through stage 4 chronic kidney disease, or unspecified chronic kidney disease: Secondary | ICD-10-CM | POA: Diagnosis not present

## 2018-02-17 DIAGNOSIS — I4892 Unspecified atrial flutter: Secondary | ICD-10-CM | POA: Diagnosis not present

## 2018-02-17 DIAGNOSIS — I4891 Unspecified atrial fibrillation: Secondary | ICD-10-CM | POA: Diagnosis not present

## 2018-02-17 DIAGNOSIS — I421 Obstructive hypertrophic cardiomyopathy: Secondary | ICD-10-CM | POA: Diagnosis not present

## 2018-02-17 DIAGNOSIS — I2510 Atherosclerotic heart disease of native coronary artery without angina pectoris: Secondary | ICD-10-CM | POA: Diagnosis not present

## 2018-02-17 DIAGNOSIS — Z940 Kidney transplant status: Secondary | ICD-10-CM | POA: Diagnosis not present

## 2018-02-17 DIAGNOSIS — E785 Hyperlipidemia, unspecified: Secondary | ICD-10-CM | POA: Diagnosis not present

## 2018-02-17 DIAGNOSIS — D751 Secondary polycythemia: Secondary | ICD-10-CM | POA: Diagnosis not present

## 2018-02-18 ENCOUNTER — Encounter: Payer: Self-pay | Admitting: Internal Medicine

## 2018-02-18 ENCOUNTER — Ambulatory Visit (INDEPENDENT_AMBULATORY_CARE_PROVIDER_SITE_OTHER): Payer: 59 | Admitting: Internal Medicine

## 2018-02-18 VITALS — BP 130/82 | HR 53 | Ht 68.0 in | Wt 199.2 lb

## 2018-02-18 DIAGNOSIS — I10 Essential (primary) hypertension: Secondary | ICD-10-CM

## 2018-02-18 DIAGNOSIS — I481 Persistent atrial fibrillation: Secondary | ICD-10-CM | POA: Diagnosis not present

## 2018-02-18 DIAGNOSIS — I422 Other hypertrophic cardiomyopathy: Secondary | ICD-10-CM | POA: Diagnosis not present

## 2018-02-18 DIAGNOSIS — I484 Atypical atrial flutter: Secondary | ICD-10-CM

## 2018-02-18 DIAGNOSIS — I4819 Other persistent atrial fibrillation: Secondary | ICD-10-CM

## 2018-02-18 NOTE — Progress Notes (Signed)
PCP: Patient, No Pcp Per   Primary EP: Dr Barbette Merino Sulak is a 57 y.o. male who presents today for routine electrophysiology followup.  Since last being seen in our clinic, the patient reports doing reasonably well.  He did require cardioversion in November but has done well since.  He is pleased with his currently health state.  Looking forward to vacation with his spouse in October.  Today, he denies symptoms of palpitations, chest pain, shortness of breath,  lower extremity edema, dizziness, presyncope, or syncope.  The patient is otherwise without complaint today.   Past Medical History:  Diagnosis Date  . Arteriovenous fistula, acquired ([place])   . Atrial fibrillation and flutter ([place])   . Bladder stones   . BMI 31.0-31.9,adult   . CAD (coronary artery disease)    a. s/p CABG 03/2012: LIMA to LAD, free RIMA to OM1, SVG to D1, sequential SVG to AM and LPLB2, EVH via right thigh and leg.  . Cellulitis 04/13/2012   RLE saphenous vein harvest incision   . CKD (chronic kidney disease)   . Dyslipidemia   . Gout   . History of kidney transplant   . Hyperglycemia   . Hyperlipidemia   . Hyperparathyroidism   . Hypertension   . Hypertrophic cardiomyopathy ([place])   . Hypomagnesemia   . Immunosuppression ([place])   . Intermittent palpitations   . Microscopic hematuria   . Migraines   . PAF and Flutter    a. Afib post-op CABG; AFlutter 10/2012  . Peripheral vascular disease ([place])   . Post-transplant erythrocytosis   . Renal disease   . S/P living-donor kidney transplantation   . Sinus node dysfunction-post termination pause    a. >8sec in setting of aflutter 10/2012  . Sleep pattern disturbance   . Stroke [facility]) 1995; 2001   denies residual  . Superficial vein thrombosis    a. R greater saphenous 04/2012  . Vitamin D deficiency   . VT (ventricular tachycardia) ([place])    Past Surgical History:  Procedure Laterality Date  . ATRIAL FIBRILLATION ABLATION N/A 08/21/2017   Procedure:  Atrial Fibrillation Ablation;  Surgeon: [name], MD;  Location: [place] CV LAB;  Service: Cardiovascular;  Laterality: N/A;  . ATRIAL FLUTTER ABLATION N/A 11/09/2012   Procedure: ATRIAL FLUTTER ABLATION;  Surgeon: [name], MD;  Location: [facility] CATH LAB;  Service: Cardiovascular;  Laterality: N/A;  . AV FISTULA PLACEMENT  2011   left forearm  . AV FISTULA PLACEMENT    . CARDIAC CATHETERIZATION  03/16/12  . CARDIOVERSION N/A 06/10/2017   Procedure: CARDIOVERSION;  Surgeon: Pixie Casino, MD;  Location: [facility] ENDOSCOPY;  Service: Cardiovascular;  Laterality: N/A;  . CARDIOVERSION N/A 10/13/2017   Procedure: CARDIOVERSION;  Surgeon: Dorothy Spark, MD;  Location: [facility] ENDOSCOPY;  Service: Cardiovascular;  Laterality: N/A;  . CORONARY ARTERY BYPASS GRAFT  03/19/2012   Procedure: CORONARY ARTERY BYPASS GRAFTING (CABG);  Surgeon: Rexene Alberts, MD;  Location: [place];  Service: Open Heart Surgery;  Laterality: N/A;  (B) MAMMARY  . CYSTOSCOPY WITH BIOPSY N/A 08/15/2016   Procedure: CYSTOSCOPY WITH IDENTIFICATION OF RIGHT TRANSPLANTATION OF URETRAL ORFICE WITH FLUORESCEIN;  Surgeon: Carolan Clines, MD;  Location: [facility] ORS;  Service: Urology;  Laterality: N/A;  . DENTAL SURGERY  2008   multiple  . [place]   left  . FRACTURE SURGERY    . HERNIA REPAIR  01/5851   umbilical  . HOLMIUM LASER  APPLICATION N/A 8/41/3244   Procedure: HOLMIUM LASER APPLICATION WITH 3 CM RIGHT BLADDER STONE;  Surgeon: Carolan Clines, MD;  Location: [facility];  Service: Urology;  Laterality: N/A;  . INGUINAL HERNIA REPAIR  09/2009   bilaterally  . KIDNEY TRANSPLANT    . LEFT HEART CATHETERIZATION WITH CORONARY ANGIOGRAM N/A 03/16/2012   Procedure: LEFT HEART CATHETERIZATION WITH CORONARY ANGIOGRAM;  Surgeon: Sherren Mocha, MD;  Location: [facility] CATH LAB;  Service: Cardiovascular;  Laterality: N/A;  . RENAL ARTERY STENT  2001  . TEE WITHOUT CARDIOVERSION N/A 08/21/2017   Procedure:  TRANSESOPHAGEAL ECHOCARDIOGRAM (TEE);  Surgeon: Larey Dresser, MD;  Location: [facility] ENDOSCOPY;  Service: Cardiovascular;  Laterality: N/A;  . TRANSURETHRAL RESECTION OF BLADDER TUMOR N/A 08/15/2016   Procedure: TRANSURETHRAL BIOPSY OF RIGHT BLADDER WALL;  Surgeon: Carolan Clines, MD;  Location: [facility];  Service: Urology;  Laterality: N/A;    ROS- all systems are reviewed and negatives except as per HPI above  Current Outpatient Medications  Medication Sig Dispense Refill  . allopurinol (ZYLOPRIM) 100 MG tablet Take 200 mg by mouth daily.     Marland Kitchen amiodarone (PACERONE) 200 MG tablet Take 1 tablet (200 mg total) daily by mouth. 180 tablet 11  . cloNIDine (CATAPRES) 0.2 MG tablet Take 0.2 mg by mouth at bedtime.     Marland Kitchen diltiazem (CARDIZEM) 60 MG tablet Take 1 tablet as needed every 6 hours for rapid afib HR over 100.    Marland Kitchen ELIQUIS 5 MG TABS tablet TAKE 1 TABLET BY MOUTH 2 TIMES DAILY. 60 tablet 10  . fenofibrate micronized (LOFIBRA) 134 MG capsule Take 134 mg by mouth daily.     . furosemide (LASIX) 20 MG tablet Take 1 tablet by mouth for the next 3 days then as needed for weight gain/swelling 10 tablet 0  . magnesium oxide (MAG-OX) 400 (241.3 Mg) MG tablet Take 400 mg by mouth 2 (two) times daily.    . metoprolol tartrate (LOPRESSOR) 50 MG tablet Take 50 mg by mouth 2 (two) times daily. Take 50 mg in the am and 25 mg in pm by mouth daily.    . mycophenolate (MYFORTIC) 180 MG EC tablet Take 720 mg by mouth 2 (two) times daily.     Marland Kitchen omega-3 acid ethyl esters (LOVAZA) 1 G capsule Take 2 g by mouth 2 (two) times daily.    Marland Kitchen omeprazole (PRILOSEC) 20 MG capsule Take 20 mg by mouth daily.  5  . predniSONE (DELTASONE) 5 MG tablet Take 5 mg by mouth at bedtime.    . sulfamethoxazole-trimethoprim (BACTRIM,SEPTRA) 400-80 MG tablet Take 1 tablet by mouth every Monday, Wednesday, and Friday.  6  . tacrolimus (PROGRAF) 0.5 MG capsule Take 0.5 mg by mouth every evening.  2  . tacrolimus (PROGRAF) 1 MG capsule  Take 1 mg by mouth every morning.   5  . tadalafil (CIALIS) 5 MG tablet Take 5 mg by mouth daily.  11   No current facility-administered medications for this visit.     Physical Exam: Vitals:   02/18/18 1621  BP: 130/82  Pulse: (!) 53  SpO2: 96%  Weight: 199 lb 3.2 oz (90.4 kg)  Height: 5\' 8"  (1.727 m)    GEN- The patient is well appearing, alert and oriented x 3 today.   Head- normocephalic, atraumatic Eyes-  Sclera clear, conjunctiva pink Ears- hearing intact Oropharynx- clear Lungs- Clear to ausculation bilaterally, normal work of breathing Heart- Regular rate and rhythm, no murmurs, rubs or gallops, PMI  not laterally displaced GI- soft, NT, ND, + BS Extremities- no clubbing, cyanosis, or edema  EKG tracing ordered today is personally reviewed and shows sinus rhythm with PACs, 52 bpm, LVH  Assessment and Plan:  1. Persistent afib/ atypical atrial flutter Stable.  He is very pleased with current health state and does not wish to make any changes at this time.  Will need LFTs, TFTs followed by nephrology.  2. HCM Stable No change required today  3. CRI S/p renal transplant  4. HTN Stable No change required today  Follow-up with Dr Caryl Comes in 3 months I will see in 6 months  [name] MD, [facility] 02/18/2018 4:42 PM

## 2018-02-18 NOTE — Patient Instructions (Addendum)
Medication Instructions:  Your physician recommends that you continue on your current medications as directed. Please refer to the Current Medication list given to you today.  Labwork: None ordered.  Testing/Procedures: None ordered.  Follow-Up: Your physician wants you to follow-up in: 3 months with Dr. Caryl Comes.  Your physician wants you to follow-up in: 6 months with Dr. Rayann Heman.  You will receive a reminder letter in the mail two months in advance. If you don't receive a letter, please call our office to schedule the follow-up appointment.  Any Other Special Instructions Will Be Listed Below (If Applicable).  If you need a refill on your cardiac medications before your next appointment, please call your pharmacy.

## 2018-02-20 MED FILL — SULFAMETHOXAZOLE/TMP SS TAB: 400-80 | 28 days supply | Qty: 12 | Fill #2

## 2018-02-24 IMAGING — CR DG ABDOMEN 1V
2 series · 2 of 2 positions shown · non-contrast
Comparison: CT 02/16/2017

CLINICAL DATA: Bladder stone, follow-up

EXAM:
ABDOMEN - 1 VIEW

[abdomen kub (1 of 2)]
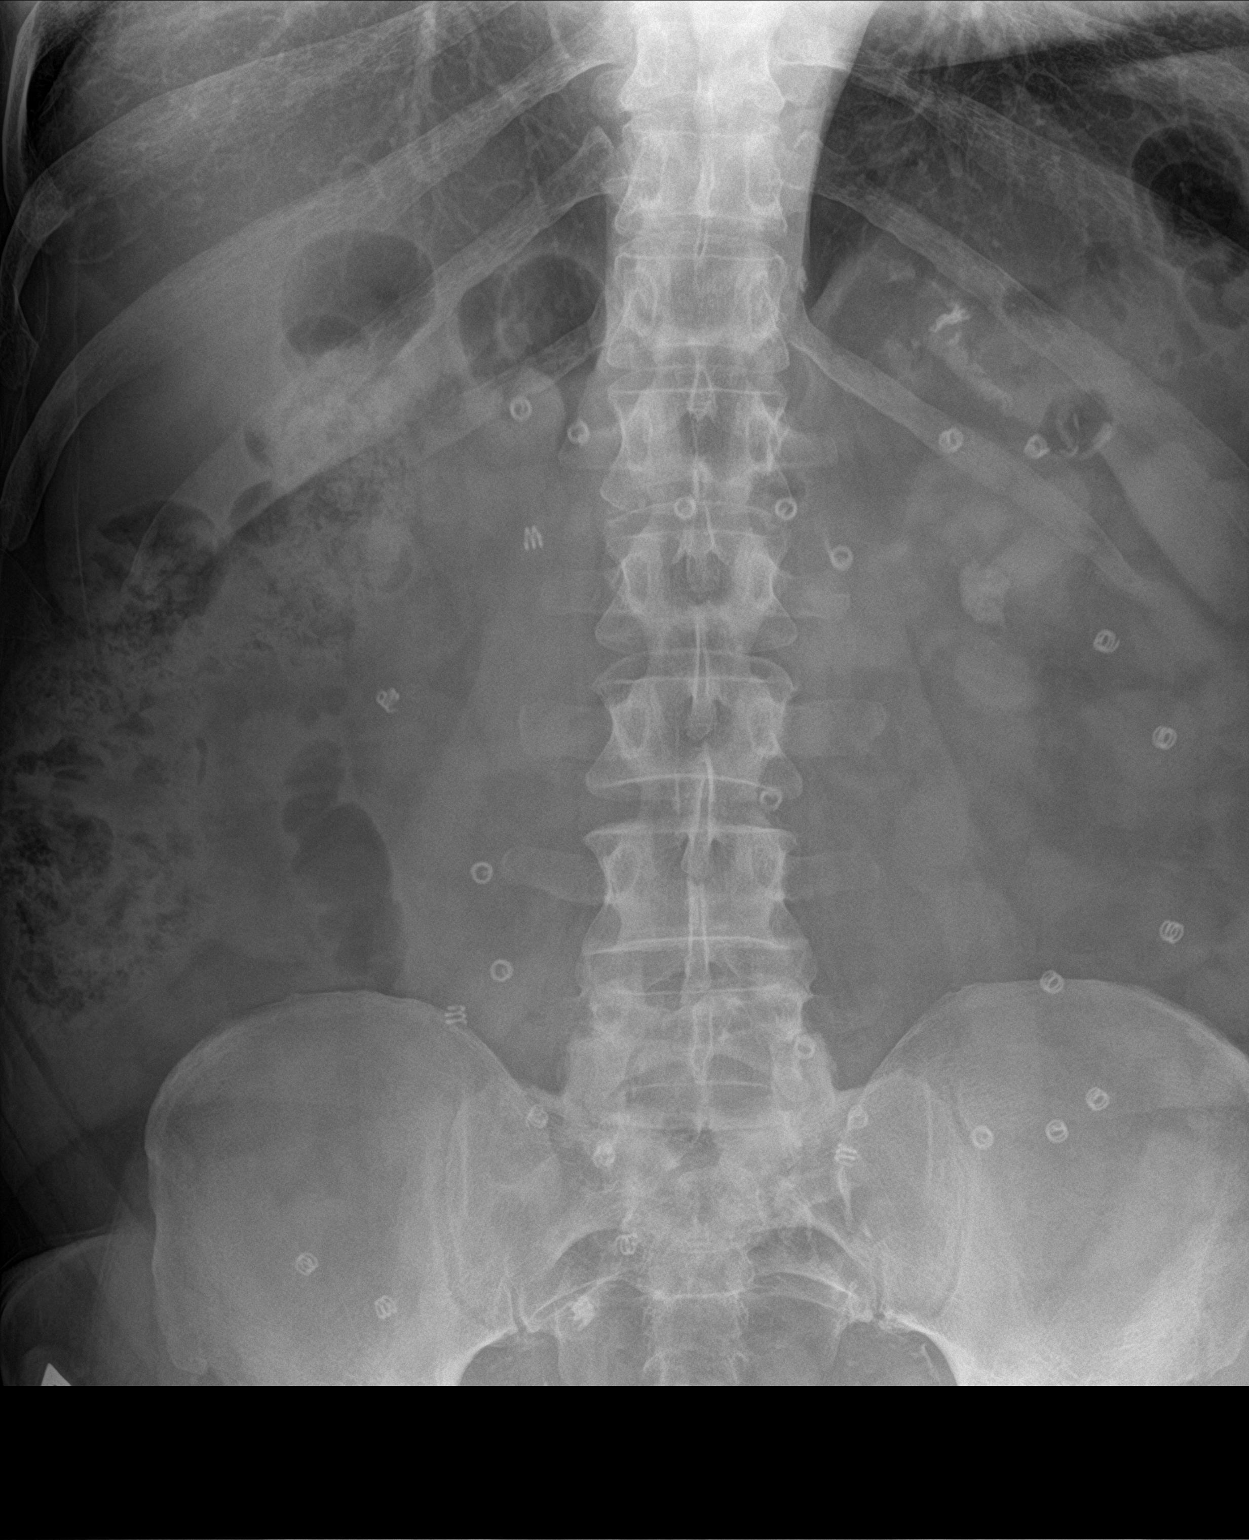

[abdomen kub (2 of 2)]
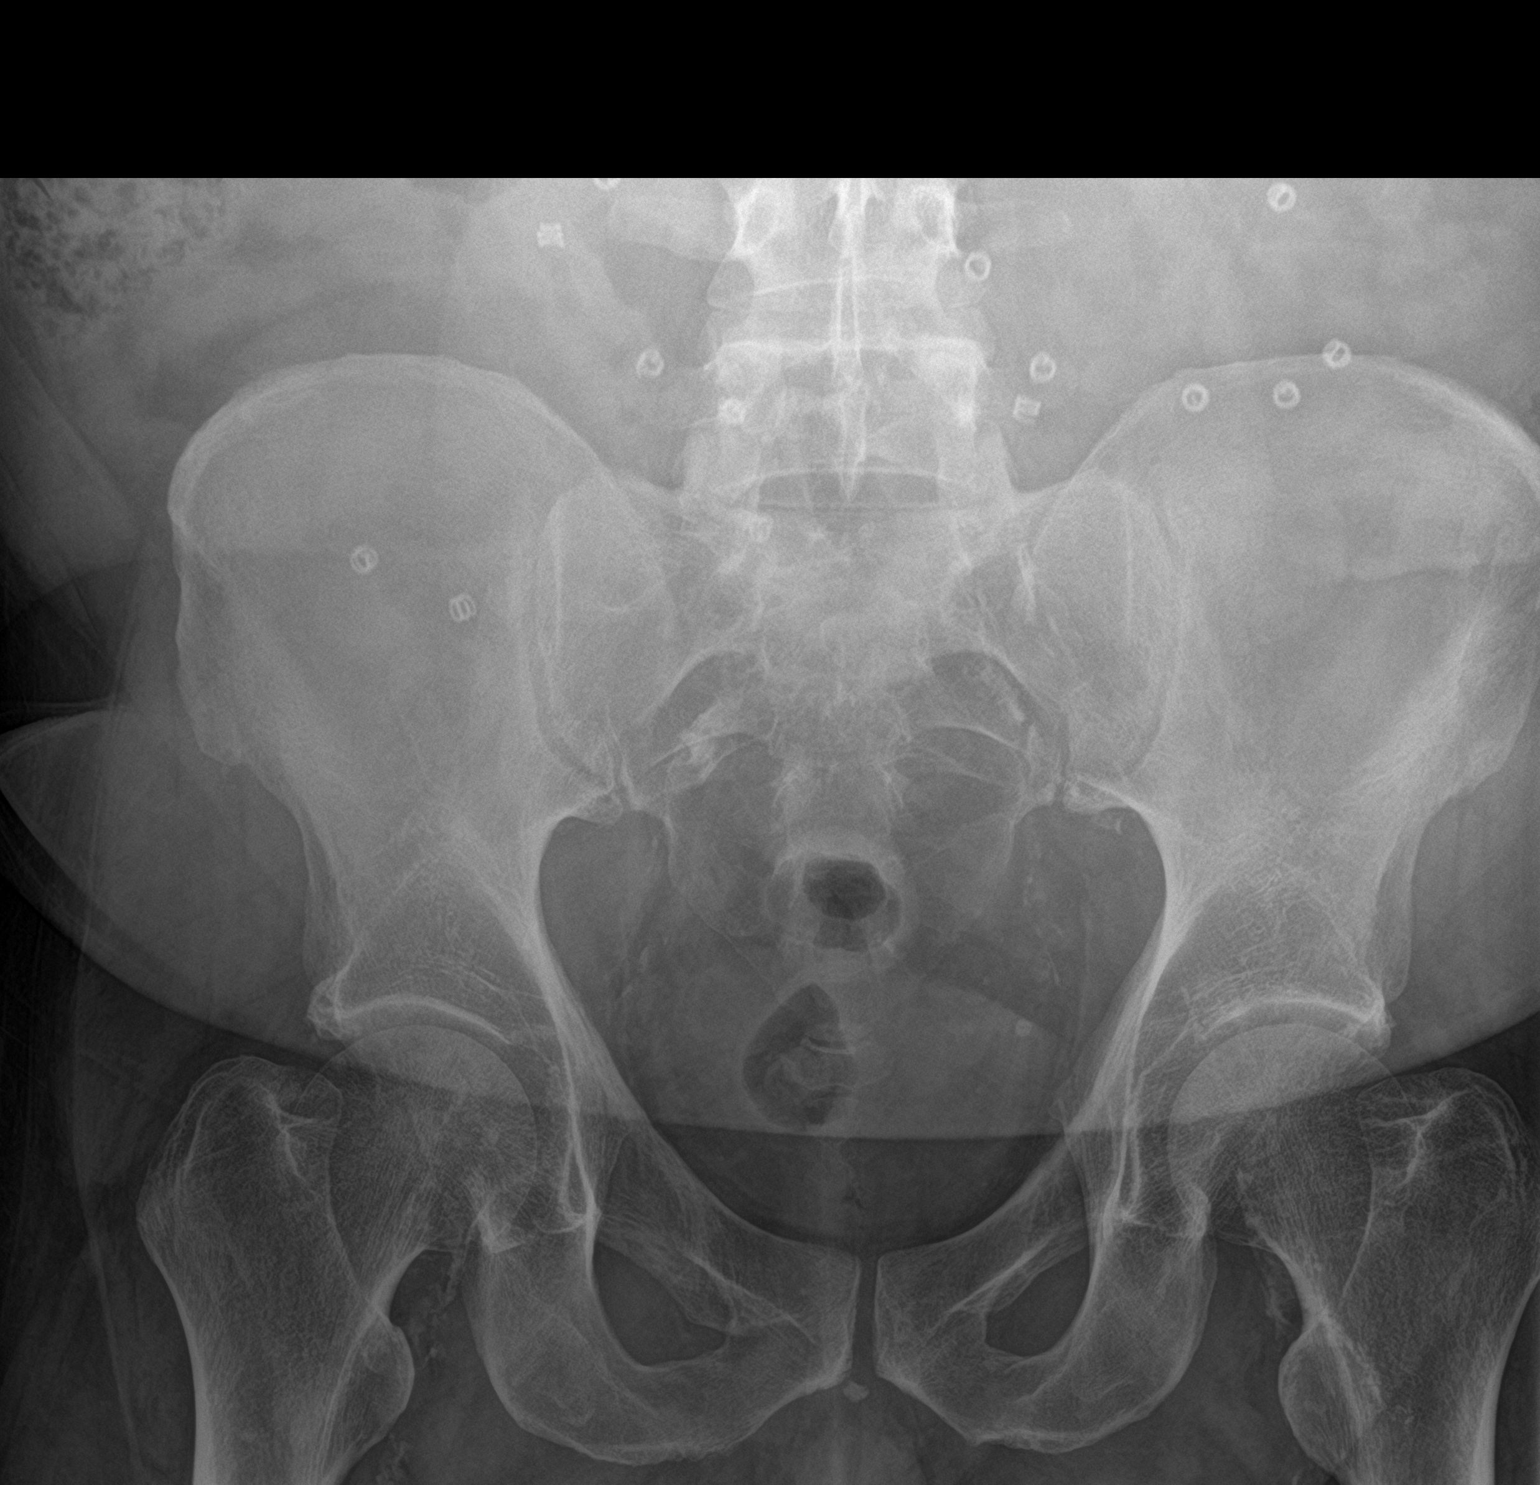

[2 of 2 positions shown; findings below may reference images not displayed]

FINDINGS: No visible bladder stones. Calcification projects over the left
kidney which was shown on prior CT to be anterior to the left
kidney. Nonobstructive bowel gas pattern. No free air organomegaly.
IMPRESSION: No suspicious calcification.  No acute findings.

## 2018-02-27 MED FILL — TACROLIMUS 1 MG CAPSULE: 1 | 30 days supply | Qty: 30 | Fill #1

## 2018-02-27 MED FILL — TACROLIMUS 0.5 MG CAPSULE: 0.5 | 30 days supply | Qty: 90 | Fill #1

## 2018-02-27 MED FILL — ELIQUIS 5 MG TABLET: 5 | 30 days supply | Qty: 60 | Fill #6

## 2018-03-06 MED FILL — FENOFIBRATE 134 MG CAPSULE: 134 | 30 days supply | Qty: 30 | Fill #4

## 2018-03-06 MED FILL — ALLOPURINOL 100 MG TABLET: 100 | 30 days supply | Qty: 60 | Fill #5

## 2018-03-06 MED FILL — METOPROLOL TARTRATE 50 MG T: 50 | 30 days supply | Qty: 30 | Fill #2

## 2018-03-12 DIAGNOSIS — D2272 Melanocytic nevi of left lower limb, including hip: Secondary | ICD-10-CM | POA: Diagnosis not present

## 2018-03-12 DIAGNOSIS — L821 Other seborrheic keratosis: Secondary | ICD-10-CM | POA: Diagnosis not present

## 2018-03-12 DIAGNOSIS — L918 Other hypertrophic disorders of the skin: Secondary | ICD-10-CM | POA: Diagnosis not present

## 2018-03-12 DIAGNOSIS — D2271 Melanocytic nevi of right lower limb, including hip: Secondary | ICD-10-CM | POA: Diagnosis not present

## 2018-03-12 DIAGNOSIS — L812 Freckles: Secondary | ICD-10-CM | POA: Diagnosis not present

## 2018-03-12 DIAGNOSIS — D225 Melanocytic nevi of trunk: Secondary | ICD-10-CM | POA: Diagnosis not present

## 2018-03-12 DIAGNOSIS — D2262 Melanocytic nevi of left upper limb, including shoulder: Secondary | ICD-10-CM | POA: Diagnosis not present

## 2018-03-12 DIAGNOSIS — D1801 Hemangioma of skin and subcutaneous tissue: Secondary | ICD-10-CM | POA: Diagnosis not present

## 2018-03-12 DIAGNOSIS — D2372 Other benign neoplasm of skin of left lower limb, including hip: Secondary | ICD-10-CM | POA: Diagnosis not present

## 2018-03-12 DIAGNOSIS — D2261 Melanocytic nevi of right upper limb, including shoulder: Secondary | ICD-10-CM | POA: Diagnosis not present

## 2018-03-12 MED FILL — predniSONE 5 MG TABS: 5 | 30 days supply | Qty: 30 | Fill #5

## 2018-03-12 MED FILL — MYCOPHENOLIC ACID DR 180 MG: 180 | 30 days supply | Qty: 240 | Fill #0

## 2018-03-12 MED FILL — OMEPRAZOLE 20 MG CAP: 20 | 90 days supply | Qty: 90 | Fill #0

## 2018-03-16 DIAGNOSIS — Z7689 Persons encountering health services in other specified circumstances: Secondary | ICD-10-CM | POA: Diagnosis not present

## 2018-03-16 DIAGNOSIS — I129 Hypertensive chronic kidney disease with stage 1 through stage 4 chronic kidney disease, or unspecified chronic kidney disease: Secondary | ICD-10-CM | POA: Diagnosis not present

## 2018-03-17 ENCOUNTER — Ambulatory Visit (HOSPITAL_COMMUNITY): Payer: 59

## 2018-03-19 MED FILL — SULFAMETHOXAZOLE/TMP SS TAB: 400-80 | 28 days supply | Qty: 12 | Fill #3

## 2018-03-26 MED FILL — ELIQUIS 5 MG TABLET: 5 | 30 days supply | Qty: 60 | Fill #7

## 2018-04-02 MED FILL — METOPROLOL TARTRATE 50 MG T: 50 | 30 days supply | Qty: 30 | Fill #3

## 2018-04-06 MED FILL — TADALAFIL 5 MG TABS: 5 | 30 days supply | Qty: 30 | Fill #2

## 2018-04-06 MED FILL — cloNIDine HCL 0.2 MG TABS: 0.2 | 30 days supply | Qty: 30 | Fill #0

## 2018-04-09 MED FILL — MYCOPHENOLIC ACID DR 180 MG: 180 | 30 days supply | Qty: 240 | Fill #1

## 2018-04-09 MED FILL — predniSONE 5 MG TABS: 5 | 30 days supply | Qty: 30 | Fill #6

## 2018-04-09 MED FILL — TACROLIMUS 1 MG CAPSULE: 1 | 30 days supply | Qty: 30 | Fill #2

## 2018-04-16 MED FILL — SULFAMETHOXAZOLE-TMP SS TAB: 400-80 | 28 days supply | Qty: 12 | Fill #4

## 2018-04-16 MED FILL — FENOFIBRATE 134 MG CAPSULE: 134 | 30 days supply | Qty: 30 | Fill #5

## 2018-04-16 MED FILL — ALLOPURINOL 100 MG TABLET: 100 | 30 days supply | Qty: 60 | Fill #0

## 2018-04-23 DIAGNOSIS — I129 Hypertensive chronic kidney disease with stage 1 through stage 4 chronic kidney disease, or unspecified chronic kidney disease: Secondary | ICD-10-CM | POA: Diagnosis not present

## 2018-04-23 DIAGNOSIS — Z940 Kidney transplant status: Secondary | ICD-10-CM | POA: Diagnosis not present

## 2018-04-23 DIAGNOSIS — Z7689 Persons encountering health services in other specified circumstances: Secondary | ICD-10-CM | POA: Diagnosis not present

## 2018-04-29 DIAGNOSIS — I4891 Unspecified atrial fibrillation: Secondary | ICD-10-CM | POA: Diagnosis not present

## 2018-04-29 DIAGNOSIS — I129 Hypertensive chronic kidney disease with stage 1 through stage 4 chronic kidney disease, or unspecified chronic kidney disease: Secondary | ICD-10-CM | POA: Diagnosis not present

## 2018-04-29 DIAGNOSIS — I421 Obstructive hypertrophic cardiomyopathy: Secondary | ICD-10-CM | POA: Diagnosis not present

## 2018-04-29 DIAGNOSIS — I2510 Atherosclerotic heart disease of native coronary artery without angina pectoris: Secondary | ICD-10-CM | POA: Diagnosis not present

## 2018-04-29 DIAGNOSIS — E559 Vitamin D deficiency, unspecified: Secondary | ICD-10-CM | POA: Diagnosis not present

## 2018-04-29 DIAGNOSIS — D751 Secondary polycythemia: Secondary | ICD-10-CM | POA: Diagnosis not present

## 2018-04-29 DIAGNOSIS — Z940 Kidney transplant status: Secondary | ICD-10-CM | POA: Diagnosis not present

## 2018-04-29 DIAGNOSIS — E785 Hyperlipidemia, unspecified: Secondary | ICD-10-CM | POA: Diagnosis not present

## 2018-04-29 DIAGNOSIS — I4892 Unspecified atrial flutter: Secondary | ICD-10-CM | POA: Diagnosis not present

## 2018-05-07 MED FILL — predniSONE 5 MG TABS: 5 | 30 days supply | Qty: 30 | Fill #7

## 2018-05-07 MED FILL — cloNIDine HCL 0.2 MG TABS: 0.2 | 30 days supply | Qty: 30 | Fill #1

## 2018-05-07 MED FILL — ELIQUIS 5 MG TABLET: 5 | 30 days supply | Qty: 60 | Fill #8

## 2018-05-07 MED FILL — TADALAFIL 5 MG TABS: 5 | 30 days supply | Qty: 30 | Fill #3

## 2018-05-07 MED FILL — OMEGA-3 ETHYL ESTERS 1 GM C: 1 | 90 days supply | Qty: 360 | Fill #0

## 2018-05-07 MED FILL — TACROLIMUS 1 MG CAPSULE: 1 | 30 days supply | Qty: 30 | Fill #3

## 2018-05-07 MED FILL — MYCOPHENOLIC ACID DR 180 MG: 180 | 30 days supply | Qty: 240 | Fill #2

## 2018-05-08 DIAGNOSIS — Z940 Kidney transplant status: Secondary | ICD-10-CM | POA: Diagnosis not present

## 2018-05-14 MED FILL — SULFAMETHOXAZOLE-TMP SS TAB: 400-80 | 28 days supply | Qty: 12 | Fill #5

## 2018-05-15 DIAGNOSIS — L720 Epidermal cyst: Secondary | ICD-10-CM | POA: Diagnosis not present

## 2018-05-15 DIAGNOSIS — L821 Other seborrheic keratosis: Secondary | ICD-10-CM | POA: Diagnosis not present

## 2018-05-15 MED FILL — ALLOPURINOL 100 MG TABLET: 100 | 30 days supply | Qty: 60 | Fill #1

## 2018-05-21 MED FILL — AMIODARONE HCL 200 MG TAB: 200 | 90 days supply | Qty: 180 | Fill #2

## 2018-05-26 ENCOUNTER — Encounter: Payer: Self-pay | Admitting: Internal Medicine

## 2018-05-26 ENCOUNTER — Ambulatory Visit (INDEPENDENT_AMBULATORY_CARE_PROVIDER_SITE_OTHER): Payer: 59 | Admitting: Internal Medicine

## 2018-05-26 VITALS — BP 110/70 | HR 52 | Ht 68.0 in | Wt 178.0 lb

## 2018-05-26 DIAGNOSIS — I484 Atypical atrial flutter: Secondary | ICD-10-CM | POA: Diagnosis not present

## 2018-05-26 NOTE — Patient Instructions (Addendum)
Medication Instructions:  Your physician recommends that you continue on your current medications as directed. Please refer to the Current Medication list given to you today.  Labwork: None ordered.    We will contact [facility] for recent labs.   Testing/Procedures: Your physician has requested that you have an exercise tolerance test. For further information please visit [email or web address]. Please also follow instruction sheet, as given.   Pt understands he should hold his beta blocker for 24 hrs before his GXT.  Follow-Up: Your physician wants you to follow-up in: 6 months with Dr Caryl Comes. You will receive a reminder letter in the mail two months in advance. If you don't receive a letter, please call our office to schedule the follow-up appointment.   Any Other Special Instructions Will Be Listed Below (If Applicable).     If you need a refill on your cardiac medications before your next appointment, please call your pharmacy.

## 2018-05-26 NOTE — Progress Notes (Signed)
Patient Care Team: Patient, No Pcp Per as PCP - General (General Practice)   HPI  [name] is a 57 y.o. male Seen in followup for concerns about atrial fibrillation in the context of significant left atrial enlargement prior atrial flutter for which he underwent catheter ablation 2013 and left ventricular hypertrophy.  He had a history of ischemic heart disease with prior bypass surgery 4/13 and had post CABG atrial fibrillation.  He has had recurrent infrequent episodes of prolonged irregular tachycardia palpitations. These have been associated with fatigue and dyspnea and residual weakness. There reminiscent of his rhythm disturbances with atrial fibrillation. Alive[employer] demonstrated PACs but no AFib--  Has hx of remote stroke   Also has hx of renal transplant complicated by repeat rejection requiring steroids.   9/17  CR 1.5  K 4.6   Echocardiogram 9/15 demonstrated normal LV systolic function; qualitative comments were "severe concentric hypertrophy" although the measurements are 17/11 suggesting some degree of asymmetry Left atrial dimension was 53/2.4    MRI. 7/16 this demonstrated mid cavitary obliteration and asymmetric septal hypertrophy 18/11 consistent with HCM   These have been repeated now since 3/18. ECGs were reviewed. They appeared to be atrial flutter with a P wave vector similar to typical flutter but amplitudes particularly in lead V1 which are very atypical.  He saw Dr. Greggory Brandy and underwent catheter ablation for atrial flutter as well as atrial fibrillation.  9/18.Marland Kitchen  No interval palpitations.  He has some sensitivity with his amiodarone is also multiple other substances or drugs.  No nausea cough or balance issues.  Resting heart rates are in the 50s.  He notes no limit to exercise intolerance.  He has lost 50 pounds in the last 6 months with exercise and diet.  Past Medical History:  Diagnosis Date  . Arteriovenous fistula, acquired ([place])   . Atrial  fibrillation and flutter ([place])   . Bladder stones   . BMI 31.0-31.9,adult   . CAD (coronary artery disease)    a. s/p CABG 03/2012: LIMA to LAD, free RIMA to OM1, SVG to D1, sequential SVG to AM and LPLB2, EVH via right thigh and leg.  . Cellulitis 04/13/2012   RLE saphenous vein harvest incision   . CKD (chronic kidney disease)   . Dyslipidemia   . Gout   . History of kidney transplant   . Hyperglycemia   . Hyperlipidemia   . Hyperparathyroidism   . Hypertension   . Hypertrophic cardiomyopathy ([place])   . Hypomagnesemia   . Immunosuppression ([place])   . Intermittent palpitations   . Microscopic hematuria   . Migraines   . PAF and Flutter    a. Afib post-op CABG; AFlutter 10/2012  . Peripheral vascular disease ([place])   . Post-transplant erythrocytosis   . Renal disease   . S/P living-donor kidney transplantation   . Sinus node dysfunction-post termination pause    a. >8sec in setting of aflutter 10/2012  . Sleep pattern disturbance   . Stroke [facility]) 1995; 2001   denies residual  . Superficial vein thrombosis    a. R greater saphenous 04/2012  . Vitamin D deficiency   . VT (ventricular tachycardia) ([place])     Past Surgical History:  Procedure Laterality Date  . ATRIAL FIBRILLATION ABLATION N/A 08/21/2017   Procedure: Atrial Fibrillation Ablation;  Surgeon: [name], MD;  Location: [place] CV LAB;  Service: Cardiovascular;  Laterality: N/A;  . ATRIAL FLUTTER ABLATION N/A 11/09/2012  Procedure: ATRIAL FLUTTER ABLATION;  Surgeon: [name], MD;  Location: [facility] CATH LAB;  Service: Cardiovascular;  Laterality: N/A;  . AV FISTULA PLACEMENT  2011   left forearm  . AV FISTULA PLACEMENT    . CARDIAC CATHETERIZATION  03/16/12  . CARDIOVERSION N/A 06/10/2017   Procedure: CARDIOVERSION;  Surgeon: Pixie Casino, MD;  Location: [facility] ENDOSCOPY;  Service: Cardiovascular;  Laterality: N/A;  . CARDIOVERSION N/A 10/13/2017   Procedure: CARDIOVERSION;  Surgeon: Dorothy Spark, MD;   Location: [facility] ENDOSCOPY;  Service: Cardiovascular;  Laterality: N/A;  . CORONARY ARTERY BYPASS GRAFT  03/19/2012   Procedure: CORONARY ARTERY BYPASS GRAFTING (CABG);  Surgeon: Rexene Alberts, MD;  Location: [place];  Service: Open Heart Surgery;  Laterality: N/A;  (B) MAMMARY  . CYSTOSCOPY WITH BIOPSY N/A 08/15/2016   Procedure: CYSTOSCOPY WITH IDENTIFICATION OF RIGHT TRANSPLANTATION OF URETRAL ORFICE WITH FLUORESCEIN;  Surgeon: Carolan Clines, MD;  [facility];  Service: Urology;  Laterality: N/A;  . DENTAL SURGERY  2008   multiple  . [place]   left  . FRACTURE SURGERY    . HERNIA REPAIR  26/3785   umbilical  . HOLMIUM LASER APPLICATION N/A 8/85/0277   Procedure: HOLMIUM LASER APPLICATION WITH 3 CM RIGHT BLADDER STONE;  Surgeon: Carolan Clines, MD;  Location[facility];  Service: Urology;  Laterality: N/A;  . INGUINAL HERNIA REPAIR  09/2009   bilaterally  . KIDNEY TRANSPLANT    . LEFT HEART CATHETERIZATION WITH CORONARY ANGIOGRAM N/A 03/16/2012   Procedure: LEFT HEART CATHETERIZATION WITH CORONARY ANGIOGRAM;  Surgeon: Sherren Mocha, MD;  Location[facility] CATH LAB;  Service: Cardiovascular;  Laterality: N/A;  . RENAL ARTERY STENT  2001  . TEE WITHOUT CARDIOVERSION N/A 08/21/2017   Procedure: TRANSESOPHAGEAL ECHOCARDIOGRAM (TEE);  Surgeon: Larey Dresser, MD;  Location: [facility] ENDOSCOPY;  Service: Cardiovascular;  Laterality: N/A;  . TRANSURETHRAL RESECTION OF BLADDER TUMOR N/A 08/15/2016   Procedure: TRANSURETHRAL BIOPSY OF RIGHT BLADDER WALL;  Surgeon: Carolan Clines, MD;  Location: [facility];  Service: Urology;  Laterality: N/A;    Current Outpatient Medications  Medication Sig Dispense Refill  . allopurinol (ZYLOPRIM) 100 MG tablet Take 200 mg by mouth daily.     Marland Kitchen amiodarone (PACERONE) 200 MG tablet Take 1 tablet (200 mg total) daily by mouth. 180 tablet 11  . cloNIDine (CATAPRES) 0.2 MG tablet Take 0.2 mg by mouth at bedtime.     Marland Kitchen diltiazem (CARDIZEM) 60 MG  tablet Take 1 tablet as needed every 6 hours for rapid afib HR over 100.    Marland Kitchen ELIQUIS 5 MG TABS tablet TAKE 1 TABLET BY MOUTH 2 TIMES DAILY. 60 tablet 10  . fenofibrate micronized (LOFIBRA) 134 MG capsule Take 134 mg by mouth daily.     . magnesium oxide (MAG-OX) 400 (241.3 Mg) MG tablet Take 400 mg by mouth 2 (two) times daily.    . metoprolol tartrate (LOPRESSOR) 25 MG tablet Take 25 mg by mouth 2 (two) times daily.    . mycophenolate (MYFORTIC) 180 MG EC tablet Take 720 mg by mouth 2 (two) times daily.     Marland Kitchen omega-3 acid ethyl esters (LOVAZA) 1 G capsule Take 2 g by mouth 2 (two) times daily.    Marland Kitchen omeprazole (PRILOSEC) 20 MG capsule Take 20 mg by mouth daily.  5  . predniSONE (DELTASONE) 5 MG tablet Take 5 mg by mouth at bedtime.    . sulfamethoxazole-trimethoprim (BACTRIM,SEPTRA) 400-80 MG tablet Take 1  tablet by mouth every Monday, Wednesday, and Friday.  6  . tacrolimus (PROGRAF) 0.5 MG capsule Take 0.5 mg by mouth every evening.  2  . tacrolimus (PROGRAF) 1 MG capsule Take 1 mg by mouth every morning.   5  . tadalafil (CIALIS) 5 MG tablet Take 5 mg by mouth daily.  11   No current facility-administered medications for this visit.     Allergies  Allergen Reactions  . Contrast Media [Iodinated Diagnostic Agents] Other (See Comments)    Pt states that this medication causes him to code.   Marland Kitchen Penicillins Itching, Rash and Other (See Comments)    Has patient had a PCN reaction causing immediate rash, facial/tongue/throat swelling, SOB or lightheadedness with hypotension: No Has patient had a PCN reaction causing severe rash involving mucus membranes or skin necrosis: No Has patient had a PCN reaction that required hospitalization No Has patient had a PCN reaction occurring within the last 10 years: No If all of the above answers are "NO", then may proceed with Cephalosporin use.  Marland Kitchen Lisinopril     Increased Cr      Review of Systems negative except from HPI and PMH  Physical  Exam BP 110/70   Pulse (!) 52   Ht 5\' 8"  (1.727 m)   Wt 178 lb (80.7 kg)   SpO2 96%   BMI 27.06 kg/m  Well developed and nourished in no acute distress HENT normal Neck supple with JVP-flat Haley Regular rate and rhythm, no murmurs or gallops Abd-soft with active BS No Clubbing cyanosis edema Skin-warm and dry A & Oriented  Grossly normal sensory and motor function    ECG sinus at 52   Assessment and  Plan   Atrial Flutter  S/p RFCA-- recurrent and atypical  Freq PACs  LAE  LVH-Asymmetric 18/11  HCM    Sleep disordered breathing   Sinus bradycardia  Status post kidney transplant  He is tolerated his amiodarone well. Transaminases were normal at the kidney office 5/19.  We will arrange for TSH.  It may be appropriate to discontinue his metoprolol at this time.  I will defer this to Dr. Lorrene Reid will make decision relative to his blood pressure  We spent more than 50% of our >25 min visit in face to face counseling regarding the above

## 2018-05-28 MED FILL — TACROLIMUS 0.5 MG CAPSULE: 0.5 | 30 days supply | Qty: 90 | Fill #2

## 2018-06-05 MED FILL — cloNIDine HCL 0.2 MG TABS: 0.2 | 30 days supply | Qty: 30 | Fill #2

## 2018-06-05 MED FILL — ELIQUIS 5 MG TABLET: 5 | 30 days supply | Qty: 60 | Fill #9

## 2018-06-05 MED FILL — TACROLIMUS 1 MG CAPSULE: 1 | 30 days supply | Qty: 30 | Fill #4

## 2018-06-05 MED FILL — predniSONE 5 MG TABS: 5 | 30 days supply | Qty: 30 | Fill #8

## 2018-06-05 MED FILL — MYCOPHENOLIC ACID DR 180 MG: 180 | 30 days supply | Qty: 240 | Fill #3

## 2018-06-05 MED FILL — METOPROLOL TARTRATE 25 MG T: 25 | 90 days supply | Qty: 90 | Fill #1

## 2018-06-10 ENCOUNTER — Ambulatory Visit (INDEPENDENT_AMBULATORY_CARE_PROVIDER_SITE_OTHER): Payer: 59

## 2018-06-10 DIAGNOSIS — I484 Atypical atrial flutter: Secondary | ICD-10-CM | POA: Diagnosis not present

## 2018-06-11 DIAGNOSIS — E559 Vitamin D deficiency, unspecified: Secondary | ICD-10-CM | POA: Diagnosis not present

## 2018-06-11 DIAGNOSIS — N2581 Secondary hyperparathyroidism of renal origin: Secondary | ICD-10-CM | POA: Diagnosis not present

## 2018-06-11 DIAGNOSIS — Z79899 Other long term (current) drug therapy: Secondary | ICD-10-CM | POA: Diagnosis not present

## 2018-06-11 DIAGNOSIS — Z940 Kidney transplant status: Secondary | ICD-10-CM | POA: Diagnosis not present

## 2018-06-11 DIAGNOSIS — R739 Hyperglycemia, unspecified: Secondary | ICD-10-CM | POA: Diagnosis not present

## 2018-06-11 DIAGNOSIS — I129 Hypertensive chronic kidney disease with stage 1 through stage 4 chronic kidney disease, or unspecified chronic kidney disease: Secondary | ICD-10-CM | POA: Diagnosis not present

## 2018-06-11 DIAGNOSIS — E785 Hyperlipidemia, unspecified: Secondary | ICD-10-CM | POA: Diagnosis not present

## 2018-06-11 DIAGNOSIS — M109 Gout, unspecified: Secondary | ICD-10-CM | POA: Diagnosis not present

## 2018-06-11 LAB — EXERCISE TOLERANCE TEST
CHL CUP MPHR: 166 {beats}/min
CSEPHR: 71 %
CSEPPHR: 118 {beats}/min
Estimated workload: 7 METS
Exercise duration (min): 5 min
Exercise duration (sec): 57 s
RPE: 166
Rest HR: 55 {beats}/min

## 2018-06-11 MED FILL — SULFAMETHOXAZOLE-TMP SS TAB: 400-80 | 28 days supply | Qty: 12 | Fill #6

## 2018-06-11 MED FILL — ALLOPURINOL 100 MG TABLET: 100 | 30 days supply | Qty: 60 | Fill #2

## 2018-06-11 MED FILL — TADALAFIL 5 MG TABS: 5 | 30 days supply | Qty: 30 | Fill #4

## 2018-06-11 MED FILL — OMEPRAZOLE 20 MG CAP: 20 | 90 days supply | Qty: 90 | Fill #1

## 2018-06-16 MED FILL — VIT D2 1.25 MG (50,000 UNIT: 1.25 MG | 84 days supply | Qty: 24 | Fill #0

## 2018-06-17 DIAGNOSIS — E559 Vitamin D deficiency, unspecified: Secondary | ICD-10-CM | POA: Diagnosis not present

## 2018-06-17 DIAGNOSIS — Z940 Kidney transplant status: Secondary | ICD-10-CM | POA: Diagnosis not present

## 2018-06-17 DIAGNOSIS — E785 Hyperlipidemia, unspecified: Secondary | ICD-10-CM | POA: Diagnosis not present

## 2018-06-17 DIAGNOSIS — I2510 Atherosclerotic heart disease of native coronary artery without angina pectoris: Secondary | ICD-10-CM | POA: Diagnosis not present

## 2018-06-17 DIAGNOSIS — I4891 Unspecified atrial fibrillation: Secondary | ICD-10-CM | POA: Diagnosis not present

## 2018-06-17 DIAGNOSIS — I129 Hypertensive chronic kidney disease with stage 1 through stage 4 chronic kidney disease, or unspecified chronic kidney disease: Secondary | ICD-10-CM | POA: Diagnosis not present

## 2018-06-17 DIAGNOSIS — I4892 Unspecified atrial flutter: Secondary | ICD-10-CM | POA: Diagnosis not present

## 2018-06-17 DIAGNOSIS — D751 Secondary polycythemia: Secondary | ICD-10-CM | POA: Diagnosis not present

## 2018-07-02 MED FILL — predniSONE 5 MG TABS: 5 | 30 days supply | Qty: 30 | Fill #9

## 2018-07-02 MED FILL — MYCOPHENOLIC ACID DR 180 MG: 180 | 30 days supply | Qty: 240 | Fill #4

## 2018-07-02 MED FILL — ELIQUIS 5 MG TABLET: 5 | 30 days supply | Qty: 60 | Fill #10

## 2018-07-02 MED FILL — cloNIDine HCL 0.2 MG TABS: 0.2 | 30 days supply | Qty: 30 | Fill #3

## 2018-07-02 MED FILL — PROGRAF 1 MG CAPSULE: 1 | 30 days supply | Qty: 30 | Fill #5

## 2018-07-07 DIAGNOSIS — B353 Tinea pedis: Secondary | ICD-10-CM | POA: Diagnosis not present

## 2018-07-07 DIAGNOSIS — B351 Tinea unguium: Secondary | ICD-10-CM | POA: Diagnosis not present

## 2018-07-07 DIAGNOSIS — L602 Onychogryphosis: Secondary | ICD-10-CM | POA: Diagnosis not present

## 2018-07-09 MED FILL — TADALAFIL 5 MG TABS: 5 | 30 days supply | Qty: 30 | Fill #5

## 2018-07-09 MED FILL — ALLOPURINOL 100 MG TABLET: 100 | 30 days supply | Qty: 60 | Fill #3

## 2018-07-09 MED FILL — SULFAMETHOXAZOLE-TMP SS TAB: 400-80 | 28 days supply | Qty: 12 | Fill #0

## 2018-07-09 MED FILL — FENOFIBRATE 134 MG CAPSULE: 134 | 30 days supply | Qty: 30 | Fill #0

## 2018-07-13 DIAGNOSIS — I129 Hypertensive chronic kidney disease with stage 1 through stage 4 chronic kidney disease, or unspecified chronic kidney disease: Secondary | ICD-10-CM | POA: Diagnosis not present

## 2018-07-13 DIAGNOSIS — Z940 Kidney transplant status: Secondary | ICD-10-CM | POA: Diagnosis not present

## 2018-07-29 DIAGNOSIS — L602 Onychogryphosis: Secondary | ICD-10-CM | POA: Diagnosis not present

## 2018-07-29 DIAGNOSIS — B351 Tinea unguium: Secondary | ICD-10-CM | POA: Diagnosis not present

## 2018-07-30 ENCOUNTER — Other Ambulatory Visit: Payer: Self-pay | Admitting: Internal Medicine

## 2018-07-30 DIAGNOSIS — R079 Chest pain, unspecified: Secondary | ICD-10-CM

## 2018-07-30 MED FILL — TACROLIMUS 0.5 MG CAPSULE: 0.5 | 30 days supply | Qty: 90 | Fill #0

## 2018-07-30 MED FILL — cloNIDine HCL 0.2 MG TABS: 0.2 | 30 days supply | Qty: 30 | Fill #4

## 2018-07-30 MED FILL — ELIQUIS 5 MG TABLET: 5 | 30 days supply | Qty: 60 | Fill #0

## 2018-07-30 MED FILL — MYCOPHENOLIC ACID DR 180 MG: 180 | 30 days supply | Qty: 240 | Fill #5

## 2018-07-30 MED FILL — predniSONE 5 MG TABS: 5 | 30 days supply | Qty: 30 | Fill #10

## 2018-07-30 NOTE — Telephone Encounter (Signed)
Pt last saw Dr Caryl Comes 05/26/18, last labs 06/11/18 Creat 1.51, age 57, weight 80.7kg, based on specified criteria pt is on appropriate dosage of Eliquis 5mg  BID.  Will refill rx.

## 2018-08-04 ENCOUNTER — Telehealth: Payer: Self-pay | Admitting: Internal Medicine

## 2018-08-04 NOTE — Telephone Encounter (Signed)
New Message          Patient returned your call states it's ok to leave a message

## 2018-08-06 MED FILL — OMEGA-3 ETHYL ESTER 1 GM CA: 1 | 90 days supply | Qty: 360 | Fill #1

## 2018-08-06 MED FILL — SULFAMETHOXAZOLE-TMP SS TAB: 400-80 | 28 days supply | Qty: 12 | Fill #1

## 2018-08-06 MED FILL — ALLOPURINOL 100 MG TABLET: 100 | 30 days supply | Qty: 60 | Fill #4

## 2018-08-13 DIAGNOSIS — I129 Hypertensive chronic kidney disease with stage 1 through stage 4 chronic kidney disease, or unspecified chronic kidney disease: Secondary | ICD-10-CM | POA: Diagnosis not present

## 2018-08-13 DIAGNOSIS — Z940 Kidney transplant status: Secondary | ICD-10-CM | POA: Diagnosis not present

## 2018-08-17 MED FILL — TACROLIMUS 1 MG CAPSULE: 1 | 30 days supply | Qty: 30 | Fill #0

## 2018-08-19 DIAGNOSIS — I4891 Unspecified atrial fibrillation: Secondary | ICD-10-CM | POA: Diagnosis not present

## 2018-08-19 DIAGNOSIS — E559 Vitamin D deficiency, unspecified: Secondary | ICD-10-CM | POA: Diagnosis not present

## 2018-08-19 DIAGNOSIS — I4892 Unspecified atrial flutter: Secondary | ICD-10-CM | POA: Diagnosis not present

## 2018-08-19 DIAGNOSIS — I129 Hypertensive chronic kidney disease with stage 1 through stage 4 chronic kidney disease, or unspecified chronic kidney disease: Secondary | ICD-10-CM | POA: Diagnosis not present

## 2018-08-19 DIAGNOSIS — D751 Secondary polycythemia: Secondary | ICD-10-CM | POA: Diagnosis not present

## 2018-08-19 DIAGNOSIS — E785 Hyperlipidemia, unspecified: Secondary | ICD-10-CM | POA: Diagnosis not present

## 2018-08-19 DIAGNOSIS — Z940 Kidney transplant status: Secondary | ICD-10-CM | POA: Diagnosis not present

## 2018-08-19 DIAGNOSIS — I2510 Atherosclerotic heart disease of native coronary artery without angina pectoris: Secondary | ICD-10-CM | POA: Diagnosis not present

## 2018-08-20 MED FILL — FENOFIBRATE 134 MG CAPSULE: 134 | 30 days supply | Qty: 30 | Fill #1

## 2018-08-20 MED FILL — TADALAFIL 5 MG TABS: 5 | 30 days supply | Qty: 30 | Fill #6

## 2018-08-27 MED FILL — predniSONE 5 MG TABS: 5 | 30 days supply | Qty: 30 | Fill #11

## 2018-08-27 MED FILL — cloNIDine HCL 0.2 MG TABS: 0.2 | 30 days supply | Qty: 30 | Fill #5

## 2018-08-27 MED FILL — ELIQUIS 5 MG TABLET: 5 | 30 days supply | Qty: 60 | Fill #1

## 2018-09-03 MED FILL — SULFAMETHOXAZOLE-TMP SS TAB: 400-80 | 28 days supply | Qty: 12 | Fill #2

## 2018-09-03 MED FILL — ALLOPURINOL 100 MG TABLET: 100 | 30 days supply | Qty: 60 | Fill #5

## 2018-09-03 MED FILL — MYCOPHENOLIC ACID DR 180 MG: 180 | 30 days supply | Qty: 240 | Fill #0

## 2018-09-03 MED FILL — METOPROLOL TARTRATE 25 MG T: 25 | 90 days supply | Qty: 90 | Fill #0

## 2018-09-08 MED FILL — COLCHICINE 0.6 MG TABS: 0.6 | 30 days supply | Qty: 30 | Fill #0

## 2018-09-10 MED FILL — VIT D2 1.25 MG (50,000 UNIT: 1.25 MG | 84 days supply | Qty: 24 | Fill #1

## 2018-09-16 DIAGNOSIS — I129 Hypertensive chronic kidney disease with stage 1 through stage 4 chronic kidney disease, or unspecified chronic kidney disease: Secondary | ICD-10-CM | POA: Diagnosis not present

## 2018-09-16 DIAGNOSIS — M109 Gout, unspecified: Secondary | ICD-10-CM | POA: Diagnosis not present

## 2018-09-16 DIAGNOSIS — Z940 Kidney transplant status: Secondary | ICD-10-CM | POA: Diagnosis not present

## 2018-09-16 DIAGNOSIS — R739 Hyperglycemia, unspecified: Secondary | ICD-10-CM | POA: Diagnosis not present

## 2018-09-25 MED FILL — TACROLIMUS 1 MG CAPSULE: 1 | 30 days supply | Qty: 30 | Fill #1

## 2018-09-28 ENCOUNTER — Encounter (HOSPITAL_COMMUNITY): Payer: Self-pay | Admitting: Nurse Practitioner

## 2018-09-28 ENCOUNTER — Ambulatory Visit (HOSPITAL_COMMUNITY)
Admission: RE | Admit: 2018-09-28 | Discharge: 2018-09-28 | Disposition: A | Discharge status: Home / Self Care | Payer: 59 | Source: Ambulatory Visit | Attending: Nurse Practitioner | Admitting: Nurse Practitioner

## 2018-09-28 VITALS — BP 132/84 | HR 112 | Ht 68.0 in | Wt 170.6 lb

## 2018-09-28 DIAGNOSIS — E559 Vitamin D deficiency, unspecified: Secondary | ICD-10-CM | POA: Diagnosis not present

## 2018-09-28 DIAGNOSIS — N189 Chronic kidney disease, unspecified: Secondary | ICD-10-CM | POA: Diagnosis not present

## 2018-09-28 DIAGNOSIS — Z7901 Long term (current) use of anticoagulants: Secondary | ICD-10-CM | POA: Diagnosis not present

## 2018-09-28 DIAGNOSIS — I4892 Unspecified atrial flutter: Secondary | ICD-10-CM | POA: Diagnosis not present

## 2018-09-28 DIAGNOSIS — Z951 Presence of aortocoronary bypass graft: Secondary | ICD-10-CM | POA: Insufficient documentation

## 2018-09-28 DIAGNOSIS — M109 Gout, unspecified: Secondary | ICD-10-CM | POA: Diagnosis not present

## 2018-09-28 DIAGNOSIS — Z86718 Personal history of other venous thrombosis and embolism: Secondary | ICD-10-CM | POA: Insufficient documentation

## 2018-09-28 DIAGNOSIS — I2510 Atherosclerotic heart disease of native coronary artery without angina pectoris: Secondary | ICD-10-CM | POA: Insufficient documentation

## 2018-09-28 DIAGNOSIS — Z8673 Personal history of transient ischemic attack (TIA), and cerebral infarction without residual deficits: Secondary | ICD-10-CM | POA: Diagnosis not present

## 2018-09-28 DIAGNOSIS — I422 Other hypertrophic cardiomyopathy: Secondary | ICD-10-CM | POA: Insufficient documentation

## 2018-09-28 DIAGNOSIS — Z8249 Family history of ischemic heart disease and other diseases of the circulatory system: Secondary | ICD-10-CM | POA: Diagnosis not present

## 2018-09-28 DIAGNOSIS — I739 Peripheral vascular disease, unspecified: Secondary | ICD-10-CM | POA: Insufficient documentation

## 2018-09-28 DIAGNOSIS — I480 Paroxysmal atrial fibrillation: Secondary | ICD-10-CM | POA: Diagnosis not present

## 2018-09-28 DIAGNOSIS — Z87891 Personal history of nicotine dependence: Secondary | ICD-10-CM | POA: Insufficient documentation

## 2018-09-28 DIAGNOSIS — Z940 Kidney transplant status: Secondary | ICD-10-CM | POA: Insufficient documentation

## 2018-09-28 DIAGNOSIS — I129 Hypertensive chronic kidney disease with stage 1 through stage 4 chronic kidney disease, or unspecified chronic kidney disease: Secondary | ICD-10-CM | POA: Insufficient documentation

## 2018-09-28 DIAGNOSIS — Z7952 Long term (current) use of systemic steroids: Secondary | ICD-10-CM | POA: Insufficient documentation

## 2018-09-28 DIAGNOSIS — Z79899 Other long term (current) drug therapy: Secondary | ICD-10-CM | POA: Diagnosis not present

## 2018-09-28 DIAGNOSIS — E785 Hyperlipidemia, unspecified: Secondary | ICD-10-CM | POA: Diagnosis not present

## 2018-09-28 DIAGNOSIS — I484 Atypical atrial flutter: Secondary | ICD-10-CM | POA: Diagnosis not present

## 2018-09-28 LAB — COMPREHENSIVE METABOLIC PANEL
ALT: 50 U/L — AB (ref 0–44)
AST: 32 U/L (ref 15–41)
Albumin: 3.6 g/dL (ref 3.5–5.0)
Alkaline Phosphatase: 53 U/L (ref 38–126)
Anion gap: 9 (ref 5–15)
BUN: 19 mg/dL (ref 6–20)
CALCIUM: 9.6 mg/dL (ref 8.9–10.3)
CHLORIDE: 102 mmol/L (ref 98–111)
CO2: 26 mmol/L (ref 22–32)
CREATININE: 1.51 mg/dL — AB (ref 0.61–1.24)
GFR calc Af Amer: 58 mL/min — ABNORMAL LOW (ref >60)
GFR, EST NON AFRICAN AMERICAN: 50 mL/min — AB (ref >60)
Glucose, Bld: 118 mg/dL — ABNORMAL HIGH (ref 70–99)
POTASSIUM: 4.1 mmol/L (ref 3.5–5.1)
SODIUM: 137 mmol/L (ref 135–145)
Total Bilirubin: 0.8 mg/dL (ref 0.3–1.2)
Total Protein: 6 g/dL — ABNORMAL LOW (ref 6.5–8.1)

## 2018-09-28 LAB — CBC
HCT: 53.8 % — ABNORMAL HIGH (ref 39.0–52.0)
Hemoglobin: 17 g/dL (ref 13.0–17.0)
MCH: 28.4 pg (ref 26.0–34.0)
MCHC: 31.6 g/dL (ref 30.0–36.0)
MCV: 90 fL (ref 80.0–100.0)
NRBC: 0 % (ref 0.0–0.2)
PLATELETS: 180 10*3/uL (ref 150–400)
RBC: 5.98 MIL/uL — AB (ref 4.22–5.81)
RDW: 14.1 % (ref 11.5–15.5)
WBC: 6.7 10*3/uL (ref 4.0–10.5)

## 2018-09-28 LAB — TSH: TSH: 3.025 u[IU]/mL (ref 0.350–4.500)

## 2018-09-28 MED FILL — OMEPRAZOLE 20 MG CPDR: 20 | 30 days supply | Qty: 30 | Fill #0

## 2018-09-28 NOTE — Progress Notes (Signed)
Primary Care Physician: Patient, No Pcp Per Referring Physician: Dr. Everlene Farrier Laws is a 57 y.o. male with a h/o  afib ablation 9/20/ 2018 and aflutter ablation in 2013.Marland Kitchen After last ablation, pt had  symptomatic arrhythmias. He was seen by Dr. Rayann Heman and started on amiodarone . This and cardioversion restored SR.    He did just recently go on a cruise and at the end of the cruise, last Friday, he felt some irregularity in his HR. Today in the clinic his EKG shows atrial flutter at 112 bpm. He states that although he was not drinking heavily, he did consume more alcohol than his usual. He also ate a lot more than usual.  Today, he denies symptoms of palpitations, chest pain, shortness of breath, orthopnea, PND, lower extremity edema, dizziness, presyncope, syncope, or neurologic sequela. The patient is tolerating medications without difficulties and is otherwise without complaint today.   Past Medical History:  Diagnosis Date  . Arteriovenous fistula, acquired ([place])   . Atrial fibrillation and flutter ([place])   . Bladder stones   . BMI 31.0-31.9,adult   . CAD (coronary artery disease)    a. s/p CABG 03/2012: LIMA to LAD, free RIMA to OM1, SVG to D1, sequential SVG to AM and LPLB2, EVH via right thigh and leg.  . Cellulitis 04/13/2012   RLE saphenous vein harvest incision   . CKD (chronic kidney disease)   . Dyslipidemia   . Gout   . History of kidney transplant   . Hyperglycemia   . Hyperlipidemia   . Hyperparathyroidism   . Hypertension   . Hypertrophic cardiomyopathy ([place])   . Hypomagnesemia   . Immunosuppression ([place])   . Intermittent palpitations   . Microscopic hematuria   . Migraines   . PAF and Flutter    a. Afib post-op CABG; AFlutter 10/2012  . Peripheral vascular disease ([place])   . Post-transplant erythrocytosis   . Renal disease   . S/P living-donor kidney transplantation   . Sinus node dysfunction-post termination pause    a. >8sec in setting of aflutter  10/2012  . Sleep pattern disturbance   . Stroke [facility]) 1995; 2001   denies residual  . Superficial vein thrombosis    a. R greater saphenous 04/2012  . Vitamin D deficiency   . VT (ventricular tachycardia) ([place])    Past Surgical History:  Procedure Laterality Date  . ATRIAL FIBRILLATION ABLATION N/A 08/21/2017   Procedure: Atrial Fibrillation Ablation;  Surgeon: [name], MD;  Location: [place] CV LAB;  Service: Cardiovascular;  Laterality: N/A;  . ATRIAL FLUTTER ABLATION N/A 11/09/2012   Procedure: ATRIAL FLUTTER ABLATION;  Surgeon: [name], MD;  Location: [facility] CATH LAB;  Service: Cardiovascular;  Laterality: N/A;  . AV FISTULA PLACEMENT  2011   left forearm  . AV FISTULA PLACEMENT    . CARDIAC CATHETERIZATION  03/16/12  . CARDIOVERSION N/A 06/10/2017   Procedure: CARDIOVERSION;  Surgeon: Pixie Casino, MD;  Location: [facility] ENDOSCOPY;  Service: Cardiovascular;  Laterality: N/A;  . CARDIOVERSION N/A 10/13/2017   Procedure: CARDIOVERSION;  Surgeon: Dorothy Spark, MD;  Location: [facility] ENDOSCOPY;  Service: Cardiovascular;  Laterality: N/A;  . CORONARY ARTERY BYPASS GRAFT  03/19/2012   Procedure: CORONARY ARTERY BYPASS GRAFTING (CABG);  Surgeon: Rexene Alberts, MD;  Location: [place];  Service: Open Heart Surgery;  Laterality: N/A;  (B) MAMMARY  . CYSTOSCOPY WITH BIOPSY N/A 08/15/2016   Procedure: CYSTOSCOPY WITH IDENTIFICATION OF RIGHT TRANSPLANTATION  OF URETRAL ORFICE WITH FLUORESCEIN;  Surgeon: Carolan Clines, MD;  [facility] OR[facility];  Service: Urology;  Laterality: N/A;  . DENTAL SURGERY  2008   multiple  . [place]   left  . FRACTURE SURGERY    . HERNIA REPAIR  44/3154   umbilical  . HOLMIUM LASER APPLICATION N/A 0/07/6760   Procedure: HOLMIUM LASER APPLICATION WITH 3 CM RIGHT BLADDER STONE;  Surgeon: Carolan Clines, MD;  [facility];  Service: Urology;  Laterality: N/A;  . INGUINAL HERNIA REPAIR  09/2009   bilaterally  . KIDNEY TRANSPLANT     . LEFT HEART CATHETERIZATION WITH CORONARY ANGIOGRAM N/A 03/16/2012   Procedure: LEFT HEART CATHETERIZATION WITH CORONARY ANGIOGRAM;  Surgeon: Sherren Mocha, MD;  Location: [facility] CATH LAB;  Service: Cardiovascular;  Laterality: N/A;  . RENAL ARTERY STENT  2001  . TEE WITHOUT CARDIOVERSION N/A 08/21/2017   Procedure: TRANSESOPHAGEAL ECHOCARDIOGRAM (TEE);  Surgeon: Larey Dresser, MD;  Location: [facility] ENDOSCOPY;  Service: Cardiovascular;  Laterality: N/A;  . TRANSURETHRAL RESECTION OF BLADDER TUMOR N/A 08/15/2016   Procedure: TRANSURETHRAL BIOPSY OF RIGHT BLADDER WALL;  Surgeon: Carolan Clines, MD;  [facility];  Service: Urology;  Laterality: N/A;    Current Outpatient Medications  Medication Sig Dispense Refill  . allopurinol (ZYLOPRIM) 100 MG tablet Take 200 mg by mouth daily.     Marland Kitchen amiodarone (PACERONE) 200 MG tablet Take 1 tablet (200 mg total) daily by mouth. 180 tablet 11  . cholecalciferol (VITAMIN D) 1000 units tablet Take 1,000 Units by mouth daily.    . cloNIDine (CATAPRES) 0.2 MG tablet Take 0.2 mg by mouth at bedtime.     Marland Kitchen diltiazem (CARDIZEM) 60 MG tablet Take 1 tablet as needed every 6 hours for rapid afib HR over 100.    Marland Kitchen ELIQUIS 5 MG TABS tablet TAKE 1 TABLET BY MOUTH 2 TIMES DAILY. 60 tablet 9  . fenofibrate micronized (LOFIBRA) 134 MG capsule Take 134 mg by mouth daily.     . magnesium oxide (MAG-OX) 400 (241.3 Mg) MG tablet Take 400 mg by mouth 2 (two) times daily.    . metoprolol tartrate (LOPRESSOR) 25 MG tablet Take 25 mg by mouth 2 (two) times daily.    . mycophenolate (MYFORTIC) 180 MG EC tablet Take 720 mg by mouth 2 (two) times daily.     Marland Kitchen omega-3 acid ethyl esters (LOVAZA) 1 G capsule Take 2 g by mouth 2 (two) times daily.    Marland Kitchen omeprazole (PRILOSEC) 20 MG capsule Take 20 mg by mouth daily.  5  . predniSONE (DELTASONE) 5 MG tablet Take 5 mg by mouth at bedtime.    . sulfamethoxazole-trimethoprim (BACTRIM,SEPTRA) 400-80 MG tablet Take 1 tablet by mouth every  Monday, Wednesday, and Friday.  6  . tacrolimus (PROGRAF) 0.5 MG capsule Take 0.5 mg by mouth every evening.  2  . tacrolimus (PROGRAF) 1 MG capsule Take 1 mg by mouth every morning.   5  . tadalafil (CIALIS) 5 MG tablet Take 5 mg by mouth daily.  11   No current facility-administered medications for this encounter.     Allergies  Allergen Reactions  . Contrast Media [Iodinated Diagnostic Agents] Other (See Comments)    Pt states that this medication causes him to code.   Marland Kitchen Penicillins Itching, Rash and Other (See Comments)    Has patient had a PCN reaction causing immediate rash, facial/tongue/throat swelling, SOB or lightheadedness with hypotension: No Has patient had a PCN  reaction causing severe rash involving mucus membranes or skin necrosis: No Has patient had a PCN reaction that required hospitalization No Has patient had a PCN reaction occurring within the last 10 years: No If all of the above answers are "NO", then may proceed with Cephalosporin use.  Marland Kitchen Lisinopril     Increased Cr    Social History   Socioeconomic History  . Marital status: Married    Spouse name: Not on file  . Number of children: Not on file  . Years of education: Not on file  . Highest education level: Not on file  Occupational History  . Not on file  Social Needs  . Financial resource strain: Not on file  . Food insecurity:    Worry: Not on file    Inability: Not on file  . Transportation needs:    Medical: Not on file    Non-medical: Not on file  Tobacco Use  . Smoking status: Former Smoker    Packs/day: 0.30    Years: 30.00    Pack years: 9.00    Types: Cigarettes    Last attempt to quit: 03/15/2012    Years since quitting: 6.5  . Smokeless tobacco: Never Used  Substance and Sexual Activity  . Alcohol use: Yes    Comment: 04/13/12 "2 mixed drinks/month"  . Drug use: No  . Sexual activity: Yes  Lifestyle  . Physical activity:    Days per week: Not on file    Minutes per session:  Not on file  . Stress: Not on file  Relationships  . Social connections:    Talks on phone: Not on file    Gets together: Not on file    Attends religious service: Not on file    Active member of club or organization: Not on file    Attends meetings of clubs or organizations: Not on file    Relationship status: Not on file  . Intimate partner violence:    Fear of current or ex partner: Not on file    Emotionally abused: Not on file    Physically abused: Not on file    Forced sexual activity: Not on file  Other Topics Concern  . Not on file  Social History Narrative  . Not on file    Family History  Adopted: Yes  Problem Relation Age of Onset  . Hypertension Other     ROS- All systems are reviewed and negative except as per the HPI above  Physical Exam: Vitals:   09/28/18 1420  BP: 132/84  Pulse: (!) 112  Weight: 77.4 kg  Height: 5\' 8"  (1.727 m)   Wt Readings from Last 3 Encounters:  09/28/18 77.4 kg  05/26/18 80.7 kg  02/18/18 90.4 kg    Labs: Lab Results  Component Value Date   NA 136 10/09/2017   K 4.2 10/09/2017   CL 105 10/09/2017   CO2 26 10/09/2017   GLUCOSE 117 (H) 10/09/2017   BUN 26 (H) 10/09/2017   CREATININE 1.73 (H) 10/09/2017   CALCIUM 9.7 10/09/2017   PHOS 6.9 (H) 04/16/2012   MG 1.7 06/14/2017   Lab Results  Component Value Date   INR 1.07 03/01/2017   No results found for: CHOL, HDL, LDLCALC, TRIG   GEN- The patient is well appearing, alert and oriented x 3 today.   Head- normocephalic, atraumatic Eyes-  Sclera clear, conjunctiva pink Ears- hearing intact Oropharynx- clear Neck- supple, no JVP Lymph- no cervical lymphadenopathy Lungs- Clear to  ausculation bilaterally, normal work of breathing Heart-irregular rate and rhythm, no murmurs, rubs or gallops, PMI not laterally displaced GI- soft, NT, ND, + BS Extremities- no clubbing, cyanosis, or edema MS- no significant deformity or atrophy Skin- no rash or lesion Psych-  euthymic mood, full affect Neuro- strength and sensation are intact  EKG- atrial flutter at 112 bpm, qrs int 130 ms, qtc 513 ms Epic records reviewed    Assessment and Plan: 1. H/o afib/flutter  S/p ablation 08/2017 Back in flutter with RVR Trigger may have been different diet/alcohol on cruise He is on eliquis 5 mg bid without missed doses and reminded not to miss any doses Will schedule for cardioversion He will increase amiodarone to 200 mg bid until cardioversion and then one week after cardioversion, reduce to 1 tab a day   F/u in afib clinic one week after cardioversion  Butch Penny C. Abayomi Pattison, [facility] [address] [place],  [address] [phone]

## 2018-09-28 NOTE — H&P (View-Only) (Signed)
Primary Care Physician: Patient, No Pcp Per Referring Physician: Dr. Everlene Farrier [place] is a 57 y.o. male with a h/o  afib ablation 9/20/ 2018 and aflutter ablation in 2013.Marland Kitchen After last ablation, pt had  symptomatic arrhythmias. He was seen by Dr. Rayann Heman and started on amiodarone . This and cardioversion restored SR.    He did just recently go on a cruise and at the end of the cruise, last Friday, he felt some irregularity in his HR. Today in the clinic his EKG shows atrial flutter at 112 bpm. He states that although he was not drinking heavily, he did consume more alcohol than his usual. He also ate a lot more than usual.  Today, he denies symptoms of palpitations, chest pain, shortness of breath, orthopnea, PND, lower extremity edema, dizziness, presyncope, syncope, or neurologic sequela. The patient is tolerating medications without difficulties and is otherwise without complaint today.   Past Medical History:  Diagnosis Date  . Arteriovenous fistula, acquired ([place])   . Atrial fibrillation and flutter ([place])   . Bladder stones   . BMI 31.0-31.9,adult   . CAD (coronary artery disease)    a. s/p CABG 03/2012: LIMA to LAD, free RIMA to OM1, SVG to D1, sequential SVG to AM and LPLB2, EVH via right thigh and leg.  . Cellulitis 04/13/2012   RLE saphenous vein harvest incision   . CKD (chronic kidney disease)   . Dyslipidemia   . Gout   . History of kidney transplant   . Hyperglycemia   . Hyperlipidemia   . Hyperparathyroidism   . Hypertension   . Hypertrophic cardiomyopathy ([place])   . Hypomagnesemia   . Immunosuppression ([place])   . Intermittent palpitations   . Microscopic hematuria   . Migraines   . PAF and Flutter    a. Afib post-op CABG; AFlutter 10/2012  . Peripheral vascular disease ([place])   . Post-transplant erythrocytosis   . Renal disease   . S/P living-donor kidney transplantation   . Sinus node dysfunction-post termination pause    a. >8sec in setting of aflutter  10/2012  . Sleep pattern disturbance   . Stroke [facility]) 1995; 2001   denies residual  . Superficial vein thrombosis    a. R greater saphenous 04/2012  . Vitamin D deficiency   . VT (ventricular tachycardia) ([place])    Past Surgical History:  Procedure Laterality Date  . ATRIAL FIBRILLATION ABLATION N/A 08/21/2017   Procedure: Atrial Fibrillation Ablation;  Surgeon: [name], MD;  Location: [place] CV LAB;  Service: Cardiovascular;  Laterality: N/A;  . ATRIAL FLUTTER ABLATION N/A 11/09/2012   Procedure: ATRIAL FLUTTER ABLATION;  Surgeon: [name], MD;  Location: [facility] CATH LAB;  Service: Cardiovascular;  Laterality: N/A;  . AV FISTULA PLACEMENT  2011   left forearm  . AV FISTULA PLACEMENT    . CARDIAC CATHETERIZATION  03/16/12  . CARDIOVERSION N/A 06/10/2017   Procedure: CARDIOVERSION;  Surgeon: Pixie Casino, MD;  Location: [facility] ENDOSCOPY;  Service: Cardiovascular;  Laterality: N/A;  . CARDIOVERSION N/A 10/13/2017   Procedure: CARDIOVERSION;  Surgeon: Dorothy Spark, MD;  Location: [facility] ENDOSCOPY;  Service: Cardiovascular;  Laterality: N/A;  . CORONARY ARTERY BYPASS GRAFT  03/19/2012   Procedure: CORONARY ARTERY BYPASS GRAFTING (CABG);  Surgeon: Rexene Alberts, MD;  Location: [place];  Service: Open Heart Surgery;  Laterality: N/A;  (B) MAMMARY  . CYSTOSCOPY WITH BIOPSY N/A 08/15/2016   Procedure: CYSTOSCOPY WITH IDENTIFICATION OF RIGHT TRANSPLANTATION  OF URETRAL ORFICE WITH FLUORESCEIN;  Surgeon: Carolan Clines, MD;  [facility];  Service: Urology;  Laterality: N/A;  . DENTAL SURGERY  2008   multiple  . [place]   left  . FRACTURE SURGERY    . HERNIA REPAIR  77/4128   umbilical  . HOLMIUM LASER APPLICATION N/A 7/86/7672   Procedure: HOLMIUM LASER APPLICATION WITH 3 CM RIGHT BLADDER STONE;  Surgeon: Carolan Clines, MD;  [facility];  Service: Urology;  Laterality: N/A;  . INGUINAL HERNIA REPAIR  09/2009   bilaterally  . KIDNEY TRANSPLANT     . LEFT HEART CATHETERIZATION WITH CORONARY ANGIOGRAM N/A 03/16/2012   Procedure: LEFT HEART CATHETERIZATION WITH CORONARY ANGIOGRAM;  Surgeon: Sherren Mocha, MD;  Location: [facility] CATH LAB;  Service: Cardiovascular;  Laterality: N/A;  . RENAL ARTERY STENT  2001  . TEE WITHOUT CARDIOVERSION N/A 08/21/2017   Procedure: TRANSESOPHAGEAL ECHOCARDIOGRAM (TEE);  Surgeon: Larey Dresser, MD;  Location: [facility] ENDOSCOPY;  Service: Cardiovascular;  Laterality: N/A;  . TRANSURETHRAL RESECTION OF BLADDER TUMOR N/A 08/15/2016   Procedure: TRANSURETHRAL BIOPSY OF RIGHT BLADDER WALL;  Surgeon: Carolan Clines, MD;  [facility];  Service: Urology;  Laterality: N/A;    Current Outpatient Medications  Medication Sig Dispense Refill  . allopurinol (ZYLOPRIM) 100 MG tablet Take 200 mg by mouth daily.     Marland Kitchen amiodarone (PACERONE) 200 MG tablet Take 1 tablet (200 mg total) daily by mouth. 180 tablet 11  . cholecalciferol (VITAMIN D) 1000 units tablet Take 1,000 Units by mouth daily.    . cloNIDine (CATAPRES) 0.2 MG tablet Take 0.2 mg by mouth at bedtime.     Marland Kitchen diltiazem (CARDIZEM) 60 MG tablet Take 1 tablet as needed every 6 hours for rapid afib HR over 100.    Marland Kitchen ELIQUIS 5 MG TABS tablet TAKE 1 TABLET BY MOUTH 2 TIMES DAILY. 60 tablet 9  . fenofibrate micronized (LOFIBRA) 134 MG capsule Take 134 mg by mouth daily.     . magnesium oxide (MAG-OX) 400 (241.3 Mg) MG tablet Take 400 mg by mouth 2 (two) times daily.    . metoprolol tartrate (LOPRESSOR) 25 MG tablet Take 25 mg by mouth 2 (two) times daily.    . mycophenolate (MYFORTIC) 180 MG EC tablet Take 720 mg by mouth 2 (two) times daily.     Marland Kitchen omega-3 acid ethyl esters (LOVAZA) 1 G capsule Take 2 g by mouth 2 (two) times daily.    Marland Kitchen omeprazole (PRILOSEC) 20 MG capsule Take 20 mg by mouth daily.  5  . predniSONE (DELTASONE) 5 MG tablet Take 5 mg by mouth at bedtime.    . sulfamethoxazole-trimethoprim (BACTRIM,SEPTRA) 400-80 MG tablet Take 1 tablet by mouth every  Monday, Wednesday, and Friday.  6  . tacrolimus (PROGRAF) 0.5 MG capsule Take 0.5 mg by mouth every evening.  2  . tacrolimus (PROGRAF) 1 MG capsule Take 1 mg by mouth every morning.   5  . tadalafil (CIALIS) 5 MG tablet Take 5 mg by mouth daily.  11   No current facility-administered medications for this encounter.     Allergies  Allergen Reactions  . Contrast Media [Iodinated Diagnostic Agents] Other (See Comments)    Pt states that this medication causes him to code.   Marland Kitchen Penicillins Itching, Rash and Other (See Comments)    Has patient had a PCN reaction causing immediate rash, facial/tongue/throat swelling, SOB or lightheadedness with hypotension: No Has patient had a PCN  reaction causing severe rash involving mucus membranes or skin necrosis: No Has patient had a PCN reaction that required hospitalization No Has patient had a PCN reaction occurring within the last 10 years: No If all of the above answers are "NO", then may proceed with Cephalosporin use.  Marland Kitchen Lisinopril     Increased Cr    Social History   Socioeconomic History  . Marital status: Married    Spouse name: Not on file  . Number of children: Not on file  . Years of education: Not on file  . Highest education level: Not on file  Occupational History  . Not on file  Social Needs  . Financial resource strain: Not on file  . Food insecurity:    Worry: Not on file    Inability: Not on file  . Transportation needs:    Medical: Not on file    Non-medical: Not on file  Tobacco Use  . Smoking status: Former Smoker    Packs/day: 0.30    Years: 30.00    Pack years: 9.00    Types: Cigarettes    Last attempt to quit: 03/15/2012    Years since quitting: 6.5  . Smokeless tobacco: Never Used  Substance and Sexual Activity  . Alcohol use: Yes    Comment: 04/13/12 "2 mixed drinks/month"  . Drug use: No  . Sexual activity: Yes  Lifestyle  . Physical activity:    Days per week: Not on file    Minutes per session:  Not on file  . Stress: Not on file  Relationships  . Social connections:    Talks on phone: Not on file    Gets together: Not on file    Attends religious service: Not on file    Active member of club or organization: Not on file    Attends meetings of clubs or organizations: Not on file    Relationship status: Not on file  . Intimate partner violence:    Fear of current or ex partner: Not on file    Emotionally abused: Not on file    Physically abused: Not on file    Forced sexual activity: Not on file  Other Topics Concern  . Not on file  Social History Narrative  . Not on file    Family History  Adopted: Yes  Problem Relation Age of Onset  . Hypertension Other     ROS- All systems are reviewed and negative except as per the HPI above  Physical Exam: Vitals:   09/28/18 1420  BP: 132/84  Pulse: (!) 112  Weight: 77.4 kg  Height: 5\' 8"  (1.727 m)   Wt Readings from Last 3 Encounters:  09/28/18 77.4 kg  05/26/18 80.7 kg  02/18/18 90.4 kg    Labs: Lab Results  Component Value Date   NA 136 10/09/2017   K 4.2 10/09/2017   CL 105 10/09/2017   CO2 26 10/09/2017   GLUCOSE 117 (H) 10/09/2017   BUN 26 (H) 10/09/2017   CREATININE 1.73 (H) 10/09/2017   CALCIUM 9.7 10/09/2017   PHOS 6.9 (H) 04/16/2012   MG 1.7 06/14/2017   Lab Results  Component Value Date   INR 1.07 03/01/2017   No results found for: CHOL, HDL, LDLCALC, TRIG   GEN- The patient is well appearing, alert and oriented x 3 today.   Head- normocephalic, atraumatic Eyes-  Sclera clear, conjunctiva pink Ears- hearing intact Oropharynx- clear Neck- supple, no JVP Lymph- no cervical lymphadenopathy Lungs- Clear to  ausculation bilaterally, normal work of breathing Heart-irregular rate and rhythm, no murmurs, rubs or gallops, PMI not laterally displaced GI- soft, NT, ND, + BS Extremities- no clubbing, cyanosis, or edema MS- no significant deformity or atrophy Skin- no rash or lesion Psych-  euthymic mood, full affect Neuro- strength and sensation are intact  EKG- atrial flutter at 112 bpm, qrs int 130 ms, qtc 513 ms Epic records reviewed    Assessment and Plan: 1. H/o afib/flutter  S/p ablation 08/2017 Back in flutter with RVR Trigger may have been different diet/alcohol on cruise He is on eliquis 5 mg bid without missed doses and reminded not to miss any doses Will schedule for cardioversion He will increase amiodarone to 200 mg bid until cardioversion and then one week after cardioversion, reduce to 1 tab a day   F/u in afib clinic one week after cardioversion  Butch Penny C. Melisia Leming, [facility] [address] [place], [place] [address] [phone]

## 2018-09-28 NOTE — Patient Instructions (Addendum)
Increase amiodarone to 200mg  twice a day until 1 week after cardioversion (November 14th)  Cardioversion scheduled for Thursday, November 7th  - Arrive at the [facility] and go to admitting at 11:30AM  -Do not eat or drink anything after midnight the night prior to your procedure.  - Take all your morning medication with a sip of water prior to arrival.  - You will not be able to drive home after your procedure.

## 2018-10-05 MED FILL — ALLOPURINOL 100 MG TABLET: 100 | 30 days supply | Qty: 60 | Fill #0

## 2018-10-05 MED FILL — MYCOPHENOLIC ACID DR 180 MG: 180 | 30 days supply | Qty: 240 | Fill #1

## 2018-10-05 MED FILL — ELIQUIS 5 MG TABLET: 5 | 30 days supply | Qty: 60 | Fill #2

## 2018-10-05 MED FILL — SULFAMETHOXAZOLE-TMP SS TAB: 400-80 | 28 days supply | Qty: 12 | Fill #3

## 2018-10-08 ENCOUNTER — Other Ambulatory Visit: Payer: Self-pay

## 2018-10-08 ENCOUNTER — Ambulatory Visit (HOSPITAL_COMMUNITY): Payer: 59 | Admitting: Certified Registered"

## 2018-10-08 ENCOUNTER — Ambulatory Visit (HOSPITAL_COMMUNITY)
Admission: RE | Admit: 2018-10-08 | Discharge: 2018-10-08 | Disposition: A | Discharge status: Home / Self Care | Payer: 59 | Source: Ambulatory Visit | Attending: Cardiology | Admitting: Cardiology

## 2018-10-08 ENCOUNTER — Encounter (HOSPITAL_COMMUNITY): Payer: Self-pay | Admitting: *Deleted

## 2018-10-08 ENCOUNTER — Encounter (HOSPITAL_COMMUNITY)
Admission: RE | Disposition: A | Discharge status: Home / Self Care | Payer: Self-pay | Source: Ambulatory Visit | Attending: Cardiology

## 2018-10-08 DIAGNOSIS — Z91041 Radiographic dye allergy status: Secondary | ICD-10-CM | POA: Diagnosis not present

## 2018-10-08 DIAGNOSIS — I4892 Unspecified atrial flutter: Secondary | ICD-10-CM | POA: Diagnosis not present

## 2018-10-08 DIAGNOSIS — Z7901 Long term (current) use of anticoagulants: Secondary | ICD-10-CM | POA: Insufficient documentation

## 2018-10-08 DIAGNOSIS — Z8673 Personal history of transient ischemic attack (TIA), and cerebral infarction without residual deficits: Secondary | ICD-10-CM | POA: Diagnosis not present

## 2018-10-08 DIAGNOSIS — I422 Other hypertrophic cardiomyopathy: Secondary | ICD-10-CM | POA: Diagnosis not present

## 2018-10-08 DIAGNOSIS — Z7952 Long term (current) use of systemic steroids: Secondary | ICD-10-CM | POA: Diagnosis not present

## 2018-10-08 DIAGNOSIS — I2510 Atherosclerotic heart disease of native coronary artery without angina pectoris: Secondary | ICD-10-CM | POA: Diagnosis not present

## 2018-10-08 DIAGNOSIS — N189 Chronic kidney disease, unspecified: Secondary | ICD-10-CM | POA: Diagnosis not present

## 2018-10-08 DIAGNOSIS — Z87891 Personal history of nicotine dependence: Secondary | ICD-10-CM | POA: Diagnosis not present

## 2018-10-08 DIAGNOSIS — Z880 Allergy status to penicillin: Secondary | ICD-10-CM | POA: Diagnosis not present

## 2018-10-08 DIAGNOSIS — I739 Peripheral vascular disease, unspecified: Secondary | ICD-10-CM | POA: Insufficient documentation

## 2018-10-08 DIAGNOSIS — I480 Paroxysmal atrial fibrillation: Secondary | ICD-10-CM | POA: Insufficient documentation

## 2018-10-08 DIAGNOSIS — M109 Gout, unspecified: Secondary | ICD-10-CM | POA: Diagnosis not present

## 2018-10-08 DIAGNOSIS — I129 Hypertensive chronic kidney disease with stage 1 through stage 4 chronic kidney disease, or unspecified chronic kidney disease: Secondary | ICD-10-CM | POA: Insufficient documentation

## 2018-10-08 DIAGNOSIS — I4891 Unspecified atrial fibrillation: Secondary | ICD-10-CM | POA: Diagnosis not present

## 2018-10-08 DIAGNOSIS — Z86718 Personal history of other venous thrombosis and embolism: Secondary | ICD-10-CM | POA: Diagnosis not present

## 2018-10-08 DIAGNOSIS — E785 Hyperlipidemia, unspecified: Secondary | ICD-10-CM | POA: Diagnosis not present

## 2018-10-08 DIAGNOSIS — I495 Sick sinus syndrome: Secondary | ICD-10-CM | POA: Insufficient documentation

## 2018-10-08 DIAGNOSIS — Z951 Presence of aortocoronary bypass graft: Secondary | ICD-10-CM | POA: Insufficient documentation

## 2018-10-08 DIAGNOSIS — Z940 Kidney transplant status: Secondary | ICD-10-CM | POA: Insufficient documentation

## 2018-10-08 DIAGNOSIS — N183 Chronic kidney disease, stage 3 (moderate): Secondary | ICD-10-CM | POA: Diagnosis not present

## 2018-10-08 HISTORY — PX: CARDIOVERSION: SHX1299

## 2018-10-08 SURGERY — CARDIOVERSION
Anesthesia: General

## 2018-10-08 MED ORDER — LIDOCAINE 2% (20 MG/ML) 5 ML SYRINGE
INTRAMUSCULAR | Status: DC | PRN
  Administered 2018-10-08: 60 mg via INTRAVENOUS

## 2018-10-08 MED ORDER — SODIUM CHLORIDE 0.9 % IV SOLN
INTRAVENOUS | Status: DC | PRN
  Administered 2018-10-08: 12:00:00 via INTRAVENOUS

## 2018-10-08 MED ORDER — PROPOFOL 10 MG/ML IV BOLUS
INTRAVENOUS | Status: DC | PRN
  Administered 2018-10-08: 100 mg via INTRAVENOUS

## 2018-10-08 NOTE — Anesthesia Postprocedure Evaluation (Signed)
Anesthesia Post Note  Patient: [name] Line  Procedure(s) Performed: CARDIOVERSION (N/A )     Patient location during evaluation: Endoscopy Anesthesia Type: General Level of consciousness: sedated Pain management: pain level controlled Vital Signs Assessment: post-procedure vital signs reviewed and stable Respiratory status: spontaneous breathing Cardiovascular status: stable Postop Assessment: no apparent nausea or vomiting Anesthetic complications: no    Last Vitals:  Vitals:   10/08/18 1239 10/08/18 1240  BP: 107/78   Pulse: (!) 44 (!) 44  Resp: 16 17  Temp: 36.5 C   SpO2: 99% 100%    Last Pain:  Vitals:   10/08/18 1240  TempSrc:   PainSc: 0-No pain   Pain Goal:                 Huston Foley

## 2018-10-08 NOTE — Interval H&P Note (Signed)
History and Physical Interval Note:  10/08/2018 12:30 PM  [name]  has presented today for surgery, with the diagnosis of atrial fibrillation  The various methods of treatment have been discussed with the patient and family. After consideration of risks, benefits and other options for treatment, the patient has consented to  Procedure(s): CARDIOVERSION (N/A) as a surgical intervention .  The patient's history has been reviewed, patient examined, no change in status, stable for surgery.  I have reviewed the patient's chart and labs.  Questions were answered to the patient's satisfaction.     Jaysen Wey [employer]

## 2018-10-08 NOTE — Transfer of Care (Signed)
Immediate Anesthesia Transfer of Care Note  Patient: [name]  Procedure(s) Performed: CARDIOVERSION (N/A )  Patient Location: PACU  Anesthesia Type:General  Level of Consciousness: drowsy  Airway & Oxygen Therapy: Patient Spontanous Breathing and Patient connected to face mask oxygen  Post-op Assessment: Report given to RN and Post -op Vital signs reviewed and stable  Post vital signs: Reviewed and stable  Last Vitals:  Vitals Value Taken Time  BP 110/75 10/08/2018 12:34 PM  Temp    Pulse 44 10/08/2018 12:37 PM  Resp 19 10/08/2018 12:37 PM  SpO2 100 % 10/08/2018 12:37 PM    Last Pain:  Vitals:   10/08/18 1206  TempSrc: Oral  PainSc: 0-No pain         Complications: No apparent anesthesia complications

## 2018-10-08 NOTE — Discharge Instructions (Signed)
Electrical Cardioversion, Care After °This sheet gives you information about how to care for yourself after your procedure. Your health care provider may also give you more specific instructions. If you have problems or questions, contact your health care provider. °What can I expect after the procedure? °After the procedure, it is common to have: °· Some redness on the skin where the shocks were given. ° °Follow these instructions at home: °· Do not drive for 24 hours if you were given a medicine to help you relax (sedative). °· Take over-the-counter and prescription medicines only as told by your health care provider. °· Ask your health care provider how to check your pulse. Check it often. °· Rest for 48 hours after the procedure or as told by your health care provider. °· Avoid or limit your caffeine use as told by your health care provider. °Contact a health care provider if: °· You feel like your heart is beating too quickly or your pulse is not regular. °· You have a serious muscle cramp that does not go away. °Get help right away if: °· You have discomfort in your chest. °· You are dizzy or you feel faint. °· You have trouble breathing or you are short of breath. °· Your speech is slurred. °· You have trouble moving an arm or leg on one side of your body. °· Your fingers or toes turn cold or blue. °This information is not intended to replace advice given to you by your health care provider. Make sure you discuss any questions you have with your health care provider. °Document Released: 09/08/2013 Document Revised: 06/21/2016 Document Reviewed: 05/24/2016 °Elsevier Interactive Patient Education © 2018 Elsevier Inc. ° °

## 2018-10-08 NOTE — Anesthesia Preprocedure Evaluation (Signed)
Anesthesia Evaluation  Patient identified by MRN, date of birth, ID band Patient awake    Reviewed: Allergy & Precautions, NPO status , Patient's Chart, lab work & pertinent test results  Airway Mallampati: II  TM Distance: >3 FB Neck ROM: Full    Dental  (+) Upper Dentures, Lower Dentures   Pulmonary former smoker,    Pulmonary exam normal breath sounds clear to auscultation       Cardiovascular hypertension, Pt. on medications and Pt. on home beta blockers + CAD, + CABG, + Peripheral Vascular Disease and +CHF   Rhythm:Irregular Rate:Normal  CABG 5v 2013   Neuro/Psych  Headaches, CVA, Residual Symptoms negative psych ROS   GI/Hepatic negative GI ROS,   Endo/Other  Hyperlipidemia  Renal/GU Renal InsufficiencyRenal diseaseS/P living donor renal transplant   Hx/o Bladder Calculi    Musculoskeletal  (+) Arthritis ,   Abdominal   Peds  Hematology Hx/o thrombocytopenia- normal platelet count now Chronic immunosuppression Eliquis - last dose yesterday   Anesthesia Other Findings   Reproductive/Obstetrics                             Anesthesia Physical Anesthesia Plan  ASA: III  Anesthesia Plan: General   Post-op Pain Management:    Induction:   PONV Risk Score and Plan: Treatment may vary due to age or medical condition and Ondansetron  Airway Management Planned: Mask and Natural Airway  Additional Equipment:   Intra-op Plan:   Post-operative Plan:   Informed Consent: I have reviewed the patients History and Physical, chart, labs and discussed the procedure including the risks, benefits and alternatives for the proposed anesthesia with the patient or authorized representative who has indicated his/her understanding and acceptance.   Dental advisory given  Plan Discussed with: CRNA and Surgeon  Anesthesia Plan Comments:         Anesthesia Quick Evaluation

## 2018-10-10 ENCOUNTER — Encounter (HOSPITAL_COMMUNITY): Payer: Self-pay | Admitting: Cardiology

## 2018-10-12 MED FILL — cloNIDine HCL 0.2 MG TABS: 0.2 | 30 days supply | Qty: 30 | Fill #0

## 2018-10-12 NOTE — Procedures (Signed)
Electrical Cardioversion Procedure Note [name] [id number] 1961/10/16  Procedure: Electrical Cardioversion Indications:  Atrial Fibrillation  Procedure Details Consent: Risks of procedure as well as the alternatives and risks of each were explained to the (patient/caregiver).  Consent for procedure obtained. Time Out: Verified patient identification, verified procedure, site/side was marked, verified correct patient position, special equipment/implants available, medications/allergies/relevent history reviewed, required imaging and test results available.  Performed  Patient placed on cardiac monitor, pulse oximetry, supplemental oxygen as necessary.  Sedation given: Propofol per anesthesiology Pacer pads placed anterior and posterior chest.  Cardioverted 1 time(s).  Cardioverted at [place].  Evaluation Findings: Post procedure EKG shows: NSR Complications: None Patient did tolerate procedure well.   [name] 10/12/2018, 10:58 PM

## 2018-10-13 MED FILL — predniSONE 5 MG TABS: 5 | 30 days supply | Qty: 30 | Fill #0

## 2018-10-19 DIAGNOSIS — Z940 Kidney transplant status: Secondary | ICD-10-CM | POA: Diagnosis not present

## 2018-10-19 DIAGNOSIS — I129 Hypertensive chronic kidney disease with stage 1 through stage 4 chronic kidney disease, or unspecified chronic kidney disease: Secondary | ICD-10-CM | POA: Diagnosis not present

## 2018-10-19 MED FILL — TADALAFIL 5 MG TABS: 5 | 30 days supply | Qty: 30 | Fill #7

## 2018-10-20 ENCOUNTER — Ambulatory Visit (HOSPITAL_COMMUNITY): Payer: 59 | Admitting: Nurse Practitioner

## 2018-10-20 DIAGNOSIS — A630 Anogenital (venereal) warts: Secondary | ICD-10-CM | POA: Diagnosis not present

## 2018-10-20 DIAGNOSIS — L720 Epidermal cyst: Secondary | ICD-10-CM | POA: Diagnosis not present

## 2018-10-20 DIAGNOSIS — B078 Other viral warts: Secondary | ICD-10-CM | POA: Diagnosis not present

## 2018-10-20 DIAGNOSIS — D485 Neoplasm of uncertain behavior of skin: Secondary | ICD-10-CM | POA: Diagnosis not present

## 2018-10-20 MED FILL — DOXYCYCLINE HYC 100 MG CAPS: 100 | 14 days supply | Qty: 28 | Fill #0

## 2018-10-21 ENCOUNTER — Encounter (HOSPITAL_COMMUNITY): Payer: Self-pay | Admitting: Nurse Practitioner

## 2018-10-21 ENCOUNTER — Ambulatory Visit (HOSPITAL_COMMUNITY)
Admission: RE | Admit: 2018-10-21 | Discharge: 2018-10-21 | Disposition: A | Discharge status: Home / Self Care | Payer: 59 | Source: Ambulatory Visit | Attending: Nurse Practitioner | Admitting: Nurse Practitioner

## 2018-10-21 VITALS — BP 144/86 | HR 47 | Ht 68.0 in | Wt 172.0 lb

## 2018-10-21 DIAGNOSIS — Z7952 Long term (current) use of systemic steroids: Secondary | ICD-10-CM | POA: Insufficient documentation

## 2018-10-21 DIAGNOSIS — I739 Peripheral vascular disease, unspecified: Secondary | ICD-10-CM | POA: Diagnosis not present

## 2018-10-21 DIAGNOSIS — M109 Gout, unspecified: Secondary | ICD-10-CM | POA: Insufficient documentation

## 2018-10-21 DIAGNOSIS — Z8249 Family history of ischemic heart disease and other diseases of the circulatory system: Secondary | ICD-10-CM | POA: Insufficient documentation

## 2018-10-21 DIAGNOSIS — Z9889 Other specified postprocedural states: Secondary | ICD-10-CM | POA: Insufficient documentation

## 2018-10-21 DIAGNOSIS — I129 Hypertensive chronic kidney disease with stage 1 through stage 4 chronic kidney disease, or unspecified chronic kidney disease: Secondary | ICD-10-CM | POA: Insufficient documentation

## 2018-10-21 DIAGNOSIS — Z8673 Personal history of transient ischemic attack (TIA), and cerebral infarction without residual deficits: Secondary | ICD-10-CM | POA: Insufficient documentation

## 2018-10-21 DIAGNOSIS — I2510 Atherosclerotic heart disease of native coronary artery without angina pectoris: Secondary | ICD-10-CM | POA: Diagnosis not present

## 2018-10-21 DIAGNOSIS — Z888 Allergy status to other drugs, medicaments and biological substances status: Secondary | ICD-10-CM | POA: Insufficient documentation

## 2018-10-21 DIAGNOSIS — N189 Chronic kidney disease, unspecified: Secondary | ICD-10-CM | POA: Insufficient documentation

## 2018-10-21 DIAGNOSIS — Z880 Allergy status to penicillin: Secondary | ICD-10-CM | POA: Insufficient documentation

## 2018-10-21 DIAGNOSIS — R001 Bradycardia, unspecified: Secondary | ICD-10-CM | POA: Diagnosis not present

## 2018-10-21 DIAGNOSIS — Z951 Presence of aortocoronary bypass graft: Secondary | ICD-10-CM | POA: Insufficient documentation

## 2018-10-21 DIAGNOSIS — G479 Sleep disorder, unspecified: Secondary | ICD-10-CM | POA: Insufficient documentation

## 2018-10-21 DIAGNOSIS — I484 Atypical atrial flutter: Secondary | ICD-10-CM

## 2018-10-21 DIAGNOSIS — I422 Other hypertrophic cardiomyopathy: Secondary | ICD-10-CM | POA: Diagnosis not present

## 2018-10-21 DIAGNOSIS — Z79899 Other long term (current) drug therapy: Secondary | ICD-10-CM | POA: Diagnosis not present

## 2018-10-21 DIAGNOSIS — Z87891 Personal history of nicotine dependence: Secondary | ICD-10-CM | POA: Diagnosis not present

## 2018-10-21 DIAGNOSIS — I4892 Unspecified atrial flutter: Secondary | ICD-10-CM | POA: Insufficient documentation

## 2018-10-21 DIAGNOSIS — E559 Vitamin D deficiency, unspecified: Secondary | ICD-10-CM | POA: Diagnosis not present

## 2018-10-21 DIAGNOSIS — Z940 Kidney transplant status: Secondary | ICD-10-CM | POA: Diagnosis not present

## 2018-10-21 DIAGNOSIS — E785 Hyperlipidemia, unspecified: Secondary | ICD-10-CM | POA: Insufficient documentation

## 2018-10-21 DIAGNOSIS — I447 Left bundle-branch block, unspecified: Secondary | ICD-10-CM | POA: Insufficient documentation

## 2018-10-21 DIAGNOSIS — Z86718 Personal history of other venous thrombosis and embolism: Secondary | ICD-10-CM | POA: Insufficient documentation

## 2018-10-21 DIAGNOSIS — I4891 Unspecified atrial fibrillation: Secondary | ICD-10-CM | POA: Diagnosis not present

## 2018-10-21 DIAGNOSIS — Z7901 Long term (current) use of anticoagulants: Secondary | ICD-10-CM | POA: Insufficient documentation

## 2018-10-21 MED ORDER — AMIODARONE HCL 200 MG PO TABS: 200.0000 mg | ORAL_TABLET | Freq: Every day | ORAL | 11 refills | Status: DC

## 2018-10-21 MED FILL — TACROLIMUS 1 MG CAPSULE: 1 | 30 days supply | Qty: 30 | Fill #2

## 2018-10-21 NOTE — Progress Notes (Addendum)
Primary Care Physician: Patient, No Pcp Per Referring Physician: Dr. Rayann Heman EP: Dr. Raylene Miyamoto [name] is a 57 y.o. male with a h/o  afib ablation 9/20/ 2018 and aflutter ablation in 2013.Marland Kitchen After last ablation, pt had  symptomatic arrhythmias. He was seen by Dr. Rayann Heman and started on amiodarone . This and cardioversion restored SR.    He did just recently go on a cruise and at the end of the cruise, last Friday, he felt some irregularity in his HR. Today in the clinic his EKG shows atrial flutter at 112 bpm. He states that although he was not drinking heavily, he did consume more alcohol than his usual. He also ate a lot more than usual.  F/u, 11/21,  after cardioversion which  was successful, and he continues in [place]. I did reload on amiodarone 200 mg bid  and he will now go back to one tablet a day.  Today, he denies symptoms of palpitations, chest pain, shortness of breath, orthopnea, PND, lower extremity edema, dizziness, presyncope, syncope, or neurologic sequela. The patient is tolerating medications without difficulties and is otherwise without complaint today.   Past Medical History:  Diagnosis Date  . Arteriovenous fistula, acquired ([place])   . Atrial fibrillation and flutter ([place])   . Bladder stones   . BMI 31.0-31.9,adult   . CAD (coronary artery disease)    a. s/p CABG 03/2012: LIMA to LAD, free RIMA to OM1, SVG to D1, sequential SVG to AM and LPLB2, EVH via right thigh and leg.  . Cellulitis 04/13/2012   RLE saphenous vein harvest incision   . CKD (chronic kidney disease)   . Dyslipidemia   . Gout   . History of kidney transplant   . Hyperglycemia   . Hyperlipidemia   . Hyperparathyroidism   . Hypertension   . Hypertrophic cardiomyopathy ([place])   . Hypomagnesemia   . Immunosuppression ([place])   . Intermittent palpitations   . Microscopic hematuria   . Migraines   . PAF and Flutter    a. Afib post-op CABG; AFlutter 10/2012  . Peripheral vascular disease ([place])   .  Post-transplant erythrocytosis   . Renal disease   . S/P living-donor kidney transplantation   . Sinus node dysfunction-post termination pause    a. >8sec in setting of aflutter 10/2012  . Sleep pattern disturbance   . Stroke [facility]) 1995; 2001   denies residual  . Superficial vein thrombosis    a. Steven greater saphenous 04/2012  . Vitamin D deficiency   . VT (ventricular tachycardia) ([place])    Past Surgical History:  Procedure Laterality Date  . ATRIAL FIBRILLATION ABLATION N/A 08/21/2017   Procedure: Atrial Fibrillation Ablation;  Surgeon: [name], MD;  Location: [place] CV LAB;  Service: Cardiovascular;  Laterality: N/A;  . ATRIAL FLUTTER ABLATION N/A 11/09/2012   Procedure: ATRIAL FLUTTER ABLATION;  Surgeon: [name], MD;  Location: [facility] CATH LAB;  Service: Cardiovascular;  Laterality: N/A;  . AV FISTULA PLACEMENT  2011   left forearm  . AV FISTULA PLACEMENT    . CARDIAC CATHETERIZATION  03/16/12  . CARDIOVERSION N/A 06/10/2017   Procedure: CARDIOVERSION;  Surgeon: Pixie Casino, MD;  Location: [facility] ENDOSCOPY;  Service: Cardiovascular;  Laterality: N/A;  . CARDIOVERSION N/A 10/13/2017   Procedure: CARDIOVERSION;  Surgeon: Dorothy Spark, MD;  Location: [facility] ENDOSCOPY;  Service: Cardiovascular;  Laterality: N/A;  . CARDIOVERSION N/A 10/08/2018   Procedure: CARDIOVERSION;  Surgeon: Larey Dresser, MD;  Location: [facility] ENDOSCOPY;  Service: Cardiovascular;  Laterality: N/A;  . CORONARY ARTERY BYPASS GRAFT  03/19/2012   Procedure: CORONARY ARTERY BYPASS GRAFTING (CABG);  Surgeon: Rexene Alberts, MD;  Location: [place];  Service: Open Heart Surgery;  Laterality: N/A;  (B) MAMMARY  . CYSTOSCOPY WITH BIOPSY N/A 08/15/2016   Procedure: CYSTOSCOPY WITH IDENTIFICATION OF RIGHT TRANSPLANTATION OF URETRAL ORFICE WITH FLUORESCEIN;  Surgeon: Carolan Clines, MD;  Location: [facility] ORS;  Service: Urology;  Laterality: N/A;  . DENTAL SURGERY  2008   multiple  . [place]    left  . FRACTURE SURGERY    . HERNIA REPAIR  47/0962   umbilical  . HOLMIUM LASER APPLICATION N/A 8/36/6294   Procedure: HOLMIUM LASER APPLICATION WITH 3 CM RIGHT BLADDER STONE;  Surgeon: Carolan Clines, MD;  Location: [facility] ORS;  Service: Urology;  Laterality: N/A;  . INGUINAL HERNIA REPAIR  09/2009   bilaterally  . KIDNEY TRANSPLANT    . LEFT HEART CATHETERIZATION WITH CORONARY ANGIOGRAM N/A 03/16/2012   Procedure: LEFT HEART CATHETERIZATION WITH CORONARY ANGIOGRAM;  Surgeon: Sherren Mocha, MD;  Location: [facility] CATH LAB;  Service: Cardiovascular;  Laterality: N/A;  . RENAL ARTERY STENT  2001  . TEE WITHOUT CARDIOVERSION N/A 08/21/2017   Procedure: TRANSESOPHAGEAL ECHOCARDIOGRAM (TEE);  Surgeon: Larey Dresser, MD;  Location: [facility] ENDOSCOPY;  Service: Cardiovascular;  Laterality: N/A;  . TRANSURETHRAL RESECTION OF BLADDER TUMOR N/A 08/15/2016   Procedure: TRANSURETHRAL BIOPSY OF RIGHT BLADDER WALL;  Surgeon: Carolan Clines, MD;  Location: [facility] ORS;  Service: Urology;  Laterality: N/A;    Current Outpatient Medications  Medication Sig Dispense Refill  . allopurinol (ZYLOPRIM) 100 MG tablet Take 200 mg by mouth daily.     Marland Kitchen amiodarone (PACERONE) 200 MG tablet Take 1 tablet (200 mg total) daily by mouth. (Patient taking differently: Take 200 mg by mouth 2 (two) times daily. ) 180 tablet 11  . cloNIDine (CATAPRES) 0.2 MG tablet Take 0.2 mg by mouth at bedtime.     Marland Kitchen diltiazem (CARDIZEM) 60 MG tablet Take 1 tablet as needed every 6 hours for rapid afib HR over 100.    Marland Kitchen ELIQUIS 5 MG TABS tablet TAKE 1 TABLET BY MOUTH 2 TIMES DAILY. 60 tablet 9  . fenofibrate micronized (LOFIBRA) 134 MG capsule Take 134 mg by mouth daily.     . fluticasone (FLONASE) 50 MCG/ACT nasal spray Place 1 spray into both nostrils daily.    . magnesium oxide (MAG-OX) 400 (241.3 Mg) MG tablet Take 400 mg by mouth 2 (two) times daily.    . metoprolol tartrate (LOPRESSOR) 25 MG tablet Take 25 mg by mouth daily.     .  mycophenolate (MYFORTIC) 180 MG EC tablet Take 720 mg by mouth 2 (two) times daily.     Marland Kitchen omega-3 acid ethyl esters (LOVAZA) 1 G capsule Take 2 g by mouth 2 (two) times daily.    Marland Kitchen omeprazole (PRILOSEC) 20 MG capsule Take 20 mg by mouth daily.  5  . predniSONE (DELTASONE) 5 MG tablet Take 5 mg by mouth at bedtime.    . sulfamethoxazole-trimethoprim (BACTRIM,SEPTRA) 400-80 MG tablet Take 1 tablet by mouth every Monday, Wednesday, and Friday.  6  . tacrolimus (PROGRAF) 0.5 MG capsule Take 0.5 mg by mouth every evening.  2  . tacrolimus (PROGRAF) 1 MG capsule Take 1 mg by mouth every morning.   5  . tadalafil (CIALIS) 5 MG tablet Take 5 mg by mouth at bedtime.  11  . Vitamin D, Ergocalciferol, (DRISDOL) 50000 units CAPS capsule Take 50,000 Units by mouth 2 (two) times a week. Tuesdays and Saturdays  5   No current facility-administered medications for this encounter.     Allergies  Allergen Reactions  . Contrast Media [Iodinated Diagnostic Agents] Other (See Comments)    Pt states that this medication causes him to code.   Marland Kitchen Penicillins Itching, Rash and Other (See Comments)    Has patient had a PCN reaction causing immediate rash, facial/tongue/throat swelling, SOB or lightheadedness with hypotension: No Has patient had a PCN reaction causing severe rash involving mucus membranes or skin necrosis: No Has patient had a PCN reaction that required hospitalization No Has patient had a PCN reaction occurring within the last 10 years: No If all of the above answers are "NO", then may proceed with Cephalosporin use.  Marland Kitchen Lisinopril     Increased Cr    Social History   Socioeconomic History  . Marital status: Married    Spouse name: Not on file  . Number of children: Not on file  . Years of education: Not on file  . Highest education level: Not on file  Occupational History  . Not on file  Social Needs  . Financial resource strain: Not on file  . Food insecurity:    Worry: Not on file     Inability: Not on file  . Transportation needs:    Medical: Not on file    Non-medical: Not on file  Tobacco Use  . Smoking status: Former Smoker    Packs/day: 0.30    Years: 30.00    Pack years: 9.00    Types: Cigarettes    Last attempt to quit: 03/15/2012    Years since quitting: 6.6  . Smokeless tobacco: Never Used  Substance and Sexual Activity  . Alcohol use: Yes    Comment: 04/13/12 "2 mixed drinks/month"  . Drug use: No  . Sexual activity: Yes  Lifestyle  . Physical activity:    Days per week: Not on file    Minutes per session: Not on file  . Stress: Not on file  Relationships  . Social connections:    Talks on phone: Not on file    Gets together: Not on file    Attends religious service: Not on file    Active member of club or organization: Not on file    Attends meetings of clubs or organizations: Not on file    Relationship status: Not on file  . Intimate partner violence:    Fear of current or ex partner: Not on file    Emotionally abused: Not on file    Physically abused: Not on file    Forced sexual activity: Not on file  Other Topics Concern  . Not on file  Social History Narrative  . Not on file    Family History  Adopted: Yes  Problem Relation Age of Onset  . Hypertension Other     ROS- All systems are reviewed and negative except as per the HPI above  Physical Exam: Vitals:   10/21/18 1438  Weight: 78 kg  Height: 5\' 8"  (1.727 m)   Wt Readings from Last 3 Encounters:  10/21/18 78 kg  09/28/18 77.4 kg  05/26/18 80.7 kg    Labs: Lab Results  Component Value Date   NA 137 09/28/2018   K 4.1 09/28/2018   CL 102 09/28/2018   CO2 26 09/28/2018   GLUCOSE 118 (H)  09/28/2018   BUN 19 09/28/2018   CREATININE 1.51 (H) 09/28/2018   CALCIUM 9.6 09/28/2018   PHOS 6.9 (H) 04/16/2012   MG 1.7 06/14/2017   Lab Results  Component Value Date   INR 1.07 03/01/2017   No results found for: CHOL, HDL, LDLCALC, TRIG   GEN- The patient is well  appearing, alert and oriented x 3 today.   Head- normocephalic, atraumatic Eyes-  Sclera clear, conjunctiva pink Ears- hearing intact Oropharynx- clear Neck- supple, no JVP Lymph- no cervical lymphadenopathy Lungs- Clear to ausculation bilaterally, normal work of breathing Heart- regular rate and rhythm, no murmurs, rubs or gallops, PMI not laterally displaced GI- soft, NT, ND, + BS Extremities- no clubbing, cyanosis, or edema MS- no significant deformity or atrophy Skin- no rash or lesion Psych- euthymic mood, full affect Neuro- strength and sensation are intact  EKG- sinus brady at 47 bpm, qrs int 124 ms, qtc 477 ms Epic records reviewed    Assessment and Plan: 1. H/o afib/flutter  Successful cardioversion 11/7 S/p ablation 08/2017 Returned to  a flutter with [place] after a cruise  Trigger may have been different diet/alcohol on cruise He is on eliquis 5 mg bid without missed doses and reminded not to miss any doses He will reduce amiodarone back to 200 mg daily  F/u with Dr. Caryl Comes 12/19  Geroge Baseman. Frieda Arnall, [facility] [address] [place], [place] [address] [phone]

## 2018-10-22 MED FILL — OMEPRAZOLE 20 MG CPDR: 20 | 30 days supply | Qty: 30 | Fill #1

## 2018-11-02 MED FILL — SULFAMETHOXAZOLE-TMP SS TAB: 400-80 | 28 days supply | Qty: 12 | Fill #4

## 2018-11-02 MED FILL — ELIQUIS 5 MG TABLET: 5 | 30 days supply | Qty: 60 | Fill #3

## 2018-11-02 MED FILL — FENOFIBRATE 134 MG CAPSULE: 134 | 30 days supply | Qty: 30 | Fill #2

## 2018-11-02 MED FILL — MYCOPHENOLIC ACID DR 180 MG: 180 | 30 days supply | Qty: 240 | Fill #2

## 2018-11-02 MED FILL — OMEGA-3 ETHYL ESTER 1 GM CA: 1 | 90 days supply | Qty: 360 | Fill #2

## 2018-11-03 DIAGNOSIS — H6983 Other specified disorders of Eustachian tube, bilateral: Secondary | ICD-10-CM | POA: Diagnosis not present

## 2018-11-03 DIAGNOSIS — J310 Chronic rhinitis: Secondary | ICD-10-CM | POA: Diagnosis not present

## 2018-11-03 DIAGNOSIS — J343 Hypertrophy of nasal turbinates: Secondary | ICD-10-CM | POA: Diagnosis not present

## 2018-11-03 MED FILL — FLUTICASONE PROP 50 MCG SPR: 50 | 30 days supply | Qty: 16 | Fill #0

## 2018-11-03 MED FILL — predniSONE 10 MG TABS: 10 | 6 days supply | Qty: 21 | Fill #0

## 2018-11-03 MED FILL — IPRATROPIUM 0.06% SPRAY: 0.06 | 21 days supply | Qty: 15 | Fill #0

## 2018-11-10 DIAGNOSIS — E559 Vitamin D deficiency, unspecified: Secondary | ICD-10-CM | POA: Diagnosis not present

## 2018-11-10 DIAGNOSIS — E785 Hyperlipidemia, unspecified: Secondary | ICD-10-CM | POA: Diagnosis not present

## 2018-11-10 DIAGNOSIS — I129 Hypertensive chronic kidney disease with stage 1 through stage 4 chronic kidney disease, or unspecified chronic kidney disease: Secondary | ICD-10-CM | POA: Diagnosis not present

## 2018-11-10 DIAGNOSIS — I2510 Atherosclerotic heart disease of native coronary artery without angina pectoris: Secondary | ICD-10-CM | POA: Diagnosis not present

## 2018-11-10 DIAGNOSIS — I4892 Unspecified atrial flutter: Secondary | ICD-10-CM | POA: Diagnosis not present

## 2018-11-10 DIAGNOSIS — D751 Secondary polycythemia: Secondary | ICD-10-CM | POA: Diagnosis not present

## 2018-11-10 DIAGNOSIS — I4891 Unspecified atrial fibrillation: Secondary | ICD-10-CM | POA: Diagnosis not present

## 2018-11-10 DIAGNOSIS — Z940 Kidney transplant status: Secondary | ICD-10-CM | POA: Diagnosis not present

## 2018-11-11 MED FILL — predniSONE 5 MG TABS: 5 | 30 days supply | Qty: 30 | Fill #1

## 2018-11-11 MED FILL — ALLOPURINOL 100 MG TABS: 100 | 30 days supply | Qty: 60 | Fill #1

## 2018-11-11 MED FILL — cloNIDine HCL 0.2 MG TABS: 0.2 | 30 days supply | Qty: 30 | Fill #1

## 2018-11-17 MED FILL — DOXYCYCLINE HYC 100 MG CAPS: 100 | 14 days supply | Qty: 28 | Fill #0

## 2018-11-18 MED FILL — OMEPRAZOLE 20 MG CPDR: 20 | 30 days supply | Qty: 30 | Fill #2

## 2018-11-18 MED FILL — TADALAFIL 5 MG TABS: 5 | 30 days supply | Qty: 30 | Fill #8

## 2018-11-18 MED FILL — TACROLIMUS 1 MG CAPSULE: 1 | 30 days supply | Qty: 30 | Fill #3

## 2018-11-19 ENCOUNTER — Encounter: Payer: Self-pay | Admitting: Internal Medicine

## 2018-11-19 ENCOUNTER — Ambulatory Visit (INDEPENDENT_AMBULATORY_CARE_PROVIDER_SITE_OTHER): Payer: 59 | Admitting: Internal Medicine

## 2018-11-19 VITALS — BP 140/78 | HR 50 | Ht 68.0 in | Wt 185.0 lb

## 2018-11-19 DIAGNOSIS — I4892 Unspecified atrial flutter: Secondary | ICD-10-CM

## 2018-11-19 DIAGNOSIS — I491 Atrial premature depolarization: Secondary | ICD-10-CM | POA: Diagnosis not present

## 2018-11-19 NOTE — Patient Instructions (Signed)

## 2018-11-19 NOTE — Progress Notes (Signed)
A series send a text to [facility], continue me a favor maybe tomorrow and look at the last echo on [name] date of birth 1961-05-31.  The issue is whether  Patient Care Team: Patient, No Pcp Per as PCP - General (General Practice)   HPI  [name] is a 57 y.o. male Seen in followup for concerns about atrial fibrillation in the context of significant left atrial enlargement prior atrial flutter for which he underwent catheter ablation 2013 and left ventricular hypertrophy.  He had a history of ischemic heart disease with prior bypass surgery 4/13 and had post CABG atrial fibrillation.  He has had recurrent infrequent episodes of prolonged irregular tachycardia palpitations. These have been associated with fatigue and dyspnea and residual weakness. There reminiscent of his rhythm disturbances with atrial fibrillation. AliveCor demonstrated PACs but no AFib--  Has hx of remote stroke   DATE TEST EF   9/15 Echo   55-65 % LVH severe LAE (53/2.4)  7/16 cMRI   ASH Mid--apical  cavitary obliteration  No contrast 2/2 renal disease  9/18 TEE 65% HCM    Date Cr K Hgb  9/17 1.5 4.6    10/19 1.5 4.1 17       Atypical and typical flutter but amplitudes particularly in lead V1 which are very atypical.  He saw Dr. Greggory Brandy and underwent catheter ablation for atrial flutter as well as atrial fibrillation.  9/18.. Atrial flutter was noninducible.  Pulmonary vein isolation was accomplished.  Multiple PACs.  Further repeat ablation was not recommended    Had 11/18  postprocedural (2 months) atrial flutter requiring cardioversion; 10/19 had recurrent atrial flutter and underwent repeat cardioversion 11/19  No interval palpitations.  He has some sensitivity with his amiodarone is also multiple other substances or drugs.  No nausea cough or balance issues.  Had a great trip to [place].  Drank a fair amount this prior to his return with the atrial flutter.  A few weeks later he had an  episode where climbing up a hill the second time in about 5 minutes he ended up short of breath with his heart pounding.  This was unusual.  He normally has been exercising but had not been doing so for the previous month.  No associated chest discomfort.  No recurrences.  Resting heart rates are in the 50s.  He notes no limit to exercise intolerance.  He has lost 50 pounds in the last 6 months with exercise and diet.  Past Medical History:  Diagnosis Date  . Arteriovenous fistula, acquired ([place])   . Atrial fibrillation and flutter ([place])   . Bladder stones   . BMI 31.0-31.9,adult   . CAD (coronary artery disease)    a. s/p CABG 03/2012: LIMA to LAD, free RIMA to OM1, SVG to D1, sequential SVG to AM and LPLB2, EVH via right thigh and leg.  . Cellulitis 04/13/2012   RLE saphenous vein harvest incision   . CKD (chronic kidney disease)   . Dyslipidemia   . Gout   . History of kidney transplant   . Hyperglycemia   . Hyperlipidemia   . Hyperparathyroidism   . Hypertension   . Hypertrophic cardiomyopathy ([place])   . Hypomagnesemia   . Immunosuppression ([place])   . Intermittent palpitations   . Microscopic hematuria   . Migraines   . PAF and Flutter    a. Afib post-op CABG; AFlutter 10/2012  . Peripheral vascular disease ([place])   .  Post-transplant erythrocytosis   . Renal disease   . S/P living-donor kidney transplantation   . Sinus node dysfunction-post termination pause    a. >8sec in setting of aflutter 10/2012  . Sleep pattern disturbance   . Stroke [facility]) 1995; 2001   denies residual  . Superficial vein thrombosis    a. R greater saphenous 04/2012  . Vitamin D deficiency   . VT (ventricular tachycardia) ([place])     Past Surgical History:  Procedure Laterality Date  . ATRIAL FIBRILLATION ABLATION N/A 08/21/2017   Procedure: Atrial Fibrillation Ablation;  Surgeon: [name], MD;  Location: [place] CV LAB;  Service: Cardiovascular;  Laterality: N/A;  . ATRIAL FLUTTER ABLATION  N/A 11/09/2012   Procedure: ATRIAL FLUTTER ABLATION;  Surgeon: [name], MD;  Location[facility] CATH LAB;  Service: Cardiovascular;  Laterality: N/A;  . AV FISTULA PLACEMENT  2011   left forearm  . AV FISTULA PLACEMENT    . CARDIAC CATHETERIZATION  03/16/12  . CARDIOVERSION N/A 06/10/2017   Procedure: CARDIOVERSION;  Surgeon: Pixie Casino, MD;  Location: [facility] ENDOSCOPY;  Service: Cardiovascular;  Laterality: N/A;  . CARDIOVERSION N/A 10/13/2017   Procedure: CARDIOVERSION;  Surgeon: Dorothy Spark, MD;  Location: [facility] ENDOSCOPY;  Service: Cardiovascular;  Laterality: N/A;  . CARDIOVERSION N/A 10/08/2018   Procedure: CARDIOVERSION;  Surgeon: Larey Dresser, MD;  Location: [facility] ENDOSCOPY;  Service: Cardiovascular;  Laterality: N/A;  . CORONARY ARTERY BYPASS GRAFT  03/19/2012   Procedure: CORONARY ARTERY BYPASS GRAFTING (CABG);  Surgeon: Rexene Alberts, MD;  Location: [place];  Service: Open Heart Surgery;  Laterality: N/A;  (B) MAMMARY  . CYSTOSCOPY WITH BIOPSY N/A 08/15/2016   Procedure: CYSTOSCOPY WITH IDENTIFICATION OF RIGHT TRANSPLANTATION OF URETRAL ORFICE WITH FLUORESCEIN;  Surgeon: Carolan Clines, MD;  Location: [facility] ORS;  Service: Urology;  Laterality: N/A;  . DENTAL SURGERY  2008   multiple  . [place]   left  . FRACTURE SURGERY    . HERNIA REPAIR  14/4315   umbilical  . HOLMIUM LASER APPLICATION N/A 4/00/8676   Procedure: HOLMIUM LASER APPLICATION WITH 3 CM RIGHT BLADDER STONE;  Surgeon: Carolan Clines, MD;  Location: [facility] ORS;  Service: Urology;  Laterality: N/A;  . INGUINAL HERNIA REPAIR  09/2009   bilaterally  . KIDNEY TRANSPLANT    . LEFT HEART CATHETERIZATION WITH CORONARY ANGIOGRAM N/A 03/16/2012   Procedure: LEFT HEART CATHETERIZATION WITH CORONARY ANGIOGRAM;  Surgeon: Sherren Mocha, MD;  Location: [facility] CATH LAB;  Service: Cardiovascular;  Laterality: N/A;  . RENAL ARTERY STENT  2001  . TEE WITHOUT CARDIOVERSION N/A 08/21/2017   Procedure:  TRANSESOPHAGEAL ECHOCARDIOGRAM (TEE);  Surgeon: Larey Dresser, MD;  Location: [facility] ENDOSCOPY;  Service: Cardiovascular;  Laterality: N/A;  . TRANSURETHRAL RESECTION OF BLADDER TUMOR N/A 08/15/2016   Procedure: TRANSURETHRAL BIOPSY OF RIGHT BLADDER WALL;  Surgeon: Carolan Clines, MD;  Location: [facility] ORS;  Service: Urology;  Laterality: N/A;    Current Outpatient Medications  Medication Sig Dispense Refill  . allopurinol (ZYLOPRIM) 100 MG tablet Take 200 mg by mouth daily.     Marland Kitchen amiodarone (PACERONE) 200 MG tablet Take 1 tablet (200 mg total) by mouth daily. 180 tablet 11  . cloNIDine (CATAPRES) 0.2 MG tablet Take 0.2 mg by mouth at bedtime.     Marland Kitchen diltiazem (CARDIZEM) 60 MG tablet Take 1 tablet as needed every 6 hours for rapid afib HR over 100.    Derrill Memo ON 11/20/2018] doxycycline (VIBRAMYCIN) 100  MG capsule Take 1 capsule by mouth 2 (two) times daily.  0  . ELIQUIS 5 MG TABS tablet TAKE 1 TABLET BY MOUTH 2 TIMES DAILY. 60 tablet 9  . fenofibrate micronized (LOFIBRA) 134 MG capsule Take 134 mg by mouth daily.     . fluticasone (FLONASE) 50 MCG/ACT nasal spray Place 1 spray into both nostrils daily.    . magnesium oxide (MAG-OX) 400 (241.3 Mg) MG tablet Take 400 mg by mouth 2 (two) times daily.    . metoprolol tartrate (LOPRESSOR) 25 MG tablet Take 25 mg by mouth daily.     . mycophenolate (MYFORTIC) 180 MG EC tablet Take 720 mg by mouth 2 (two) times daily.     Marland Kitchen omega-3 acid ethyl esters (LOVAZA) 1 G capsule Take 2 g by mouth 2 (two) times daily.    Marland Kitchen omeprazole (PRILOSEC) 20 MG capsule Take 20 mg by mouth daily.  5  . predniSONE (DELTASONE) 5 MG tablet Take 5 mg by mouth at bedtime.    . sulfamethoxazole-trimethoprim (BACTRIM,SEPTRA) 400-80 MG tablet Take 1 tablet by mouth every Monday, Wednesday, and Friday.  6  . tacrolimus (PROGRAF) 0.5 MG capsule Take 0.5 mg by mouth every evening.  2  . tacrolimus (PROGRAF) 1 MG capsule Take 1 mg by mouth every morning.   5  . tadalafil (CIALIS) 5  MG tablet Take 5 mg by mouth at bedtime.   11  . Vitamin D, Ergocalciferol, (DRISDOL) 50000 units CAPS capsule Take 50,000 Units by mouth 2 (two) times a week. Tuesdays and Saturdays  5   No current facility-administered medications for this visit.     Allergies  Allergen Reactions  . Contrast Media [Iodinated Diagnostic Agents] Other (See Comments)    Pt states that this medication causes him to code.   Marland Kitchen Penicillins Itching, Rash and Other (See Comments)    Has patient had a PCN reaction causing immediate rash, facial/tongue/throat swelling, SOB or lightheadedness with hypotension: No Has patient had a PCN reaction causing severe rash involving mucus membranes or skin necrosis: No Has patient had a PCN reaction that required hospitalization No Has patient had a PCN reaction occurring within the last 10 years: No If all of the above answers are "NO", then may proceed with Cephalosporin use.  Marland Kitchen Lisinopril     Increased Cr      Review of Systems negative except from HPI and PMH  Physical Exam BP 140/78   Pulse (!) 50   Ht 5\' 8"  (1.727 m)   Wt 185 lb (83.9 kg)   SpO2 98%   BMI 28.13 kg/m  Well developed and nourished in no acute distress HENT normal Neck supple with JVP-flat Clear Regular rate and rhythm, 2/6 M  Abd-soft with active BS No Clubbing cyanosis edema Skin-warm and dry A & Oriented  Grossly normal sensory and motor function    ECG sinus at 50 Intervals 14/16/54 LVH with QRS widening and repolarization abnormalities  Assessment and  Plan   Atrial Flutter  S/p RFCA-- recurrent and atypical s/p ablation 2018-JA  Freq PACs  LAE  LVH-Asymmetric 18/11  HCM    CAD s/p CABG 2013  Sleep disordered breathing   Sinus bradycardia  Status post kidney transplant   Recurrent atrial flutter.  May have been related to increased alcohol intake on vacation.  Tolerating amiodarone will continue.  We will have Dr. Johnsie Cancel  review the echocardiogram.  In the  context of mid cavitary obliteration 1 of the evolutions  can be apical aneurysm formation.  This would have important prognostic implications related to sudden death in this gentleman as he ages.  Bradycardia remains an issue but is not sufficient to justify intervention at this point.  No bleeding on his anticoagulation.  I am not sure the mechanism of his tachypalpitations with exertion.  It may have been the lack of conditioning over the prior month.  We will keep an eye on this and pursue if necessary.  It may be that he would require repeat catheterization.  We spent more than 50% of our >25 min visit in face to face counseling regarding the above

## 2018-11-24 LAB — ECHOCARDIOGRAM COMPLETE
HEIGHTINCHES: 68 in
WEIGHTICAEL: 3008 [oz_av]

## 2018-11-30 MED FILL — METOPROLOL TARTRATE 25 MG T: 25 | 90 days supply | Qty: 90 | Fill #1

## 2018-11-30 MED FILL — MYCOPHENOLIC ACID DR 180 MG: 180 | 30 days supply | Qty: 240 | Fill #3

## 2018-11-30 MED FILL — TACROLIMUS 0.5 MG CAPSULE: 0.5 | 30 days supply | Qty: 90 | Fill #1

## 2018-11-30 MED FILL — SULFAMETHOXAZOLE-TMP SS TAB: 400-80 | 28 days supply | Qty: 12 | Fill #5

## 2018-11-30 MED FILL — ELIQUIS 5 MG TABLET: 5 | 90 days supply | Qty: 180 | Fill #4

## 2018-12-03 DIAGNOSIS — B351 Tinea unguium: Secondary | ICD-10-CM | POA: Diagnosis not present

## 2018-12-03 DIAGNOSIS — B353 Tinea pedis: Secondary | ICD-10-CM | POA: Diagnosis not present

## 2018-12-07 MED FILL — predniSONE 5 MG TABS: 5 | 30 days supply | Qty: 30 | Fill #2

## 2018-12-07 MED FILL — ALLOPURINOL 100 MG TABS: 100 | 30 days supply | Qty: 60 | Fill #2

## 2018-12-07 MED FILL — VIT D2 1.25 MG (50,000 UNIT: 1.25 MG | 42 days supply | Qty: 12 | Fill #2

## 2018-12-07 MED FILL — cloNIDine HCL 0.2 MG TABS: 0.2 | 30 days supply | Qty: 30 | Fill #2

## 2018-12-09 DIAGNOSIS — R739 Hyperglycemia, unspecified: Secondary | ICD-10-CM | POA: Diagnosis not present

## 2018-12-09 DIAGNOSIS — I129 Hypertensive chronic kidney disease with stage 1 through stage 4 chronic kidney disease, or unspecified chronic kidney disease: Secondary | ICD-10-CM | POA: Diagnosis not present

## 2018-12-09 DIAGNOSIS — Z940 Kidney transplant status: Secondary | ICD-10-CM | POA: Diagnosis not present

## 2018-12-09 DIAGNOSIS — M109 Gout, unspecified: Secondary | ICD-10-CM | POA: Diagnosis not present

## 2018-12-16 DIAGNOSIS — J310 Chronic rhinitis: Secondary | ICD-10-CM | POA: Diagnosis not present

## 2018-12-16 DIAGNOSIS — J342 Deviated nasal septum: Secondary | ICD-10-CM | POA: Diagnosis not present

## 2018-12-16 DIAGNOSIS — J343 Hypertrophy of nasal turbinates: Secondary | ICD-10-CM | POA: Diagnosis not present

## 2018-12-16 DIAGNOSIS — H6123 Impacted cerumen, bilateral: Secondary | ICD-10-CM | POA: Diagnosis not present

## 2018-12-21 ENCOUNTER — Other Ambulatory Visit: Payer: Self-pay | Admitting: Internal Medicine

## 2018-12-21 ENCOUNTER — Other Ambulatory Visit: Payer: Self-pay

## 2018-12-21 MED ORDER — AMIODARONE HCL 200 MG PO TABS: 200.0000 mg | ORAL_TABLET | Freq: Every day | ORAL | 3 refills | Status: DC

## 2018-12-21 MED FILL — OMEPRAZOLE 20 MG CPDR: 20 | 30 days supply | Qty: 30 | Fill #3

## 2018-12-21 MED FILL — TADALAFIL 5 MG TABS: 5 | 30 days supply | Qty: 30 | Fill #9

## 2018-12-21 MED FILL — TACROLIMUS 1 MG CAPSULE: 1 | 30 days supply | Qty: 30 | Fill #4

## 2018-12-22 DIAGNOSIS — F4320 Adjustment disorder, unspecified: Secondary | ICD-10-CM | POA: Diagnosis not present

## 2018-12-24 ENCOUNTER — Other Ambulatory Visit: Payer: Self-pay | Admitting: Internal Medicine

## 2018-12-28 ENCOUNTER — Other Ambulatory Visit: Payer: Self-pay | Admitting: Internal Medicine

## 2018-12-28 DIAGNOSIS — N401 Enlarged prostate with lower urinary tract symptoms: Secondary | ICD-10-CM | POA: Diagnosis not present

## 2018-12-28 DIAGNOSIS — R3915 Urgency of urination: Secondary | ICD-10-CM | POA: Diagnosis not present

## 2018-12-28 DIAGNOSIS — N5201 Erectile dysfunction due to arterial insufficiency: Secondary | ICD-10-CM | POA: Diagnosis not present

## 2018-12-28 MED FILL — AMIODARONE HCL 200 MG TAB: 200 | 90 days supply | Qty: 180 | Fill #0

## 2018-12-28 MED FILL — MYCOPHENOLIC ACID DR 180 MG: 180 | 30 days supply | Qty: 240 | Fill #4

## 2018-12-28 MED FILL — SULFAMETHOXAZOLE-TMP SS TAB: 400-80 | 28 days supply | Qty: 12 | Fill #6

## 2018-12-28 NOTE — Telephone Encounter (Signed)
Pt's medication was sent to pt's pharmacy as requested. Confirmation received.  °

## 2018-12-29 DIAGNOSIS — F4320 Adjustment disorder, unspecified: Secondary | ICD-10-CM | POA: Diagnosis not present

## 2019-01-04 MED FILL — ALLOPURINOL 100 MG TABS: 100 | 30 days supply | Qty: 60 | Fill #3

## 2019-01-04 MED FILL — cloNIDine HCL 0.2 MG TABS: 0.2 | 30 days supply | Qty: 30 | Fill #3

## 2019-01-04 MED FILL — predniSONE 5 MG TABS: 5 | 30 days supply | Qty: 30 | Fill #3

## 2019-01-05 DIAGNOSIS — F4320 Adjustment disorder, unspecified: Secondary | ICD-10-CM | POA: Diagnosis not present

## 2019-01-06 MED FILL — FLUTICASONE PROP 50 MCG SPR: 50 | 30 days supply | Qty: 16 | Fill #1

## 2019-01-07 DIAGNOSIS — I2510 Atherosclerotic heart disease of native coronary artery without angina pectoris: Secondary | ICD-10-CM | POA: Diagnosis not present

## 2019-01-07 DIAGNOSIS — E559 Vitamin D deficiency, unspecified: Secondary | ICD-10-CM | POA: Diagnosis not present

## 2019-01-07 DIAGNOSIS — I4892 Unspecified atrial flutter: Secondary | ICD-10-CM | POA: Diagnosis not present

## 2019-01-07 DIAGNOSIS — E785 Hyperlipidemia, unspecified: Secondary | ICD-10-CM | POA: Diagnosis not present

## 2019-01-07 DIAGNOSIS — I4891 Unspecified atrial fibrillation: Secondary | ICD-10-CM | POA: Diagnosis not present

## 2019-01-07 DIAGNOSIS — D751 Secondary polycythemia: Secondary | ICD-10-CM | POA: Diagnosis not present

## 2019-01-07 DIAGNOSIS — I129 Hypertensive chronic kidney disease with stage 1 through stage 4 chronic kidney disease, or unspecified chronic kidney disease: Secondary | ICD-10-CM | POA: Diagnosis not present

## 2019-01-07 DIAGNOSIS — Z940 Kidney transplant status: Secondary | ICD-10-CM | POA: Diagnosis not present

## 2019-01-11 MED FILL — FENOFIBRATE 134 MG CAPSULE: 134 | 30 days supply | Qty: 30 | Fill #3

## 2019-01-11 MED FILL — VIT D2 1.25 MG (50,000 UNIT: 1.25 MG | 35 days supply | Qty: 10 | Fill #0

## 2019-01-18 DIAGNOSIS — L723 Sebaceous cyst: Secondary | ICD-10-CM | POA: Diagnosis not present

## 2019-01-18 MED FILL — TADALAFIL 5 MG TABS: 5 | 30 days supply | Qty: 30 | Fill #0

## 2019-01-18 MED FILL — TACROLIMUS 1 MG CAPSULE: 1 | 30 days supply | Qty: 30 | Fill #5

## 2019-01-18 MED FILL — OMEPRAZOLE 20 MG CPDR: 20 | 30 days supply | Qty: 30 | Fill #4

## 2019-01-19 DIAGNOSIS — F4320 Adjustment disorder, unspecified: Secondary | ICD-10-CM | POA: Diagnosis not present

## 2019-01-28 MED FILL — MYCOPHENOLIC ACID DR 180 MG: 180 | 30 days supply | Qty: 240 | Fill #5

## 2019-01-28 MED FILL — SULFAMETHOXAZOLE-TMP SS TAB: 400-80 | 28 days supply | Qty: 12 | Fill #0 | Status: TO

## 2019-02-02 ENCOUNTER — Telehealth: Payer: Self-pay | Admitting: Internal Medicine

## 2019-02-02 DIAGNOSIS — F4320 Adjustment disorder, unspecified: Secondary | ICD-10-CM | POA: Diagnosis not present

## 2019-02-02 DIAGNOSIS — E785 Hyperlipidemia, unspecified: Secondary | ICD-10-CM

## 2019-02-02 NOTE — Telephone Encounter (Signed)
Per Dr Caryl Comes, he would like pt to be seen by the lipid clinic.   LVM for pt explaining scheduling would be calling him for an appt.

## 2019-02-02 NOTE — Telephone Encounter (Signed)
New message   Patient would like Dr. Rayann Heman  to know that he had blood work done and his cholesterol is steady rising.

## 2019-02-04 MED FILL — predniSONE 5 MG TABS: 5 | 30 days supply | Qty: 30 | Fill #4 | Status: TO

## 2019-02-04 MED FILL — ALLOPURINOL 100 MG TABS: 100 | 30 days supply | Qty: 60 | Fill #4 | Status: TO

## 2019-02-04 MED FILL — cloNIDine HCL 0.2 MG TABS: 0.2 | 30 days supply | Qty: 30 | Fill #4 | Status: TO

## 2019-02-09 ENCOUNTER — Ambulatory Visit (INDEPENDENT_AMBULATORY_CARE_PROVIDER_SITE_OTHER): Payer: 59 | Admitting: Pharmacist

## 2019-02-09 DIAGNOSIS — E7800 Pure hypercholesterolemia, unspecified: Secondary | ICD-10-CM

## 2019-02-09 MED ORDER — ICOSAPENT ETHYL 1 G PO CAPS: 2.0000 g | ORAL_CAPSULE | Freq: Two times a day (BID) | ORAL | 11 refills | Status: DC

## 2019-02-09 MED ORDER — ROSUVASTATIN CALCIUM 10 MG PO TABS: 10.0000 mg | ORAL_TABLET | Freq: Every day | ORAL | 11 refills | Status: DC

## 2019-02-09 MED FILL — VASCEPA 1 GM CAPSULE: 1 | 30 days supply | Qty: 120 | Fill #0 | Status: TO

## 2019-02-09 MED FILL — ROSUVASTATIN CALCIUM 10 MG: 10 | 30 days supply | Qty: 30 | Fill #0 | Status: TO

## 2019-02-09 NOTE — Patient Instructions (Signed)
It was nice meeting you today.  We will start you on a new medication rosuvastatin 10 mg once daily. We will call you in one month to see how you are tolerating it.  We will send a prescription for Vascepa to your pharmacy as well. If the copay is too high, you do not need to pick it up and can continue taking fenofibrate and Lovaza.   Try to limit carbohydrates and sugar from your diet and avoid meats high in saturated fats.  Please call with any questions at [phone].

## 2019-02-09 NOTE — Progress Notes (Signed)
Patient ID: EGBERT SEIDEL                 DOB: 1961-01-15                    MRN: [id number]     HPI: [name] is a 58 y.o. male patient referred to lipid clinic by Dr. Caryl Comes. PMH is significant for CAD s/p CABG (03/2012), hyperlipidemia, HTN, stroke (1995, 2001), atrial fibrillation and flutter. Patient was previously on simvastatin, but was discontinued in October 2018 due to concurrent use of amiodarone. Patient has not tried another statin since starting amiodarone. Recent lipid panel shows elevated LDL. Patient presents to clinic today for lipid evaluation.   Patient is doing well today. Patient reports experiencing intolerable muscle aches on previous statin therapy. Patient inquired about non-statin therapies, but is agreeable to trying other statins first if necessary. Patient reports not being able to exercise as much lately, but is motivated to start biking again. Patient is also motivated to continue selecting healthy diet options. Discussed with patient the benefits of Vascepa on triglycerides.   Current Medications: fenofibrate 134 mg QD, Lovaza 2 gm BID Intolerances: Atorvastatin 20 mg, simvastatin 40 mg - myalgias with both Risk Factors: CAD, stroke, HTN  LDL goal: <70 mg/dL  Diet: Skips breakfast; consumes lunches at [employer] - eats salad twice weekly, chicken, red meat 3-4 times a week; cooks dinner. Does not eat out often. Drinks water, some soda, and alcohol every couple of weeks.   Exercise: None currently - motivated to start biking again  Family History: unknown - patient is adopted.  Social History: Former smoker - 9 pack years, quit 03/05/2012  Labs: 12/15/2018: TC 210, TG 148, HDL 38, LDL 142, LFT wnl (on fenofibrate and Lovaza)   Past Medical History:  Diagnosis Date  . Arteriovenous fistula, acquired ([place])   . Atrial fibrillation and flutter ([place])   . Bladder stones   . BMI 31.0-31.9,adult   . CAD (coronary artery disease)    a. s/p CABG 03/2012:  LIMA to LAD, free RIMA to OM1, SVG to D1, sequential SVG to AM and LPLB2, EVH via right thigh and leg.  . Cellulitis 04/13/2012   RLE saphenous vein harvest incision   . CKD (chronic kidney disease)   . Dyslipidemia   . Gout   . History of kidney transplant   . Hyperglycemia   . Hyperlipidemia   . Hyperparathyroidism   . Hypertension   . Hypertrophic cardiomyopathy ([place])   . Hypomagnesemia   . Immunosuppression ([place])   . Intermittent palpitations   . Microscopic hematuria   . Migraines   . PAF and Flutter    a. Afib post-op CABG; AFlutter 10/2012  . Peripheral vascular disease ([place])   . Post-transplant erythrocytosis   . Renal disease   . S/P living-donor kidney transplantation   . Sinus node dysfunction-post termination pause    a. >8sec in setting of aflutter 10/2012  . Sleep pattern disturbance   . Stroke [facility]) 1995; 2001   denies residual  . Superficial vein thrombosis    a. R greater saphenous 04/2012  . Vitamin D deficiency   . VT (ventricular tachycardia) ([place])     Current Outpatient Medications on File Prior to Visit  Medication Sig Dispense Refill  . allopurinol (ZYLOPRIM) 100 MG tablet Take 200 mg by mouth daily.     Marland Kitchen amiodarone (PACERONE) 200 MG tablet TAKE 1 TABLET (200 MG TOTAL) BY  MOUTH TWICE A DAY 180 tablet 3  . cloNIDine (CATAPRES) 0.2 MG tablet Take 0.2 mg by mouth at bedtime.     Marland Kitchen diltiazem (CARDIZEM) 60 MG tablet Take 1 tablet as needed every 6 hours for rapid afib HR over 100.    Marland Kitchen ELIQUIS 5 MG TABS tablet TAKE 1 TABLET BY MOUTH 2 TIMES DAILY. 60 tablet 9  . fenofibrate micronized (LOFIBRA) 134 MG capsule Take 134 mg by mouth daily.     . fluticasone (FLONASE) 50 MCG/ACT nasal spray Place 1 spray into both nostrils daily.    . magnesium oxide (MAG-OX) 400 (241.3 Mg) MG tablet Take 400 mg by mouth 2 (two) times daily.    . metoprolol tartrate (LOPRESSOR) 25 MG tablet Take 25 mg by mouth daily.     . mycophenolate (MYFORTIC) 180 MG EC tablet Take 720 mg  by mouth 2 (two) times daily.     Marland Kitchen omega-3 acid ethyl esters (LOVAZA) 1 G capsule Take 2 g by mouth 2 (two) times daily.    Marland Kitchen omeprazole (PRILOSEC) 20 MG capsule Take 20 mg by mouth daily.  5  . predniSONE (DELTASONE) 5 MG tablet Take 5 mg by mouth at bedtime.    . sulfamethoxazole-trimethoprim (BACTRIM,SEPTRA) 400-80 MG tablet Take 1 tablet by mouth every Monday, Wednesday, and Friday.  6  . tacrolimus (PROGRAF) 0.5 MG capsule Take 0.5 mg by mouth every evening.  2  . tacrolimus (PROGRAF) 1 MG capsule Take 1 mg by mouth every morning.   5  . tadalafil (CIALIS) 5 MG tablet Take 5 mg by mouth at bedtime.   11  . Vitamin D, Ergocalciferol, (DRISDOL) 50000 units CAPS capsule Take 50,000 Units by mouth 2 (two) times a week. Tuesdays and Saturdays  5   No current facility-administered medications on file prior to visit.     Allergies  Allergen Reactions  . Contrast Media [Iodinated Diagnostic Agents] Other (See Comments)    Pt states that this medication causes him to code.   Marland Kitchen Penicillins Itching, Rash and Other (See Comments)    Has patient had a PCN reaction causing immediate rash, facial/tongue/throat swelling, SOB or lightheadedness with hypotension: No Has patient had a PCN reaction causing severe rash involving mucus membranes or skin necrosis: No Has patient had a PCN reaction that required hospitalization No Has patient had a PCN reaction occurring within the last 10 years: No If all of the above answers are "NO", then may proceed with Cephalosporin use.  Marland Kitchen Lisinopril     Increased Cr    Assessment/Plan:  1. Hyperlipidemia -  Patient's LDL is above goal of <70 mg/dL. LFT wnl. Due to muscle aches with simvastatin and atorvastatin, will cautiously start rosuvastatin 10 mg daily. Rosuvastatin does not have the same interaction as simvastatin to amiodarone. Will contact patient in one month to assess for any intolerances. Will consider dose titration to 20 or 40 mg daily then. If  patient is unable to tolerate statin, may consider switching to PCSK9 inhibitor. Patient's triglycerides are at goal of <150 mg/dL. Patient is agreeable to switching Lovaza to Vascepa for cardiovascular benefit. Confirmed cost is affordable with copay card. Will continue on fenofibrate 134 mg daily. Will schedule follow-up labs 3 months.   Patient seen with Willia Craze, PharmD Candidate  Fontaine Kossman E. Doristine Shehan, PharmD, BCACP, [place] [address] [place], [place] [address] Phone: [phone]; Fax: [phone] 02/09/2019 3:58 PM

## 2019-02-11 MED FILL — TADALAFIL 5 MG TABS: 5 | 30 days supply | Qty: 30 | Fill #1 | Status: TO

## 2019-02-11 MED FILL — VIT D2 1.25 MG (50,000 UNIT: 1.25 MG | 28 days supply | Qty: 8 | Fill #1 | Status: TO

## 2019-02-11 MED FILL — FENOFIBRATE 134 MG CAPSULE: 134 | 30 days supply | Qty: 30 | Fill #4

## 2019-02-11 MED FILL — OMEPRAZOLE 20 MG CPDR: 20 | 30 days supply | Qty: 30 | Fill #5

## 2019-02-16 DIAGNOSIS — F4320 Adjustment disorder, unspecified: Secondary | ICD-10-CM | POA: Diagnosis not present

## 2019-02-23 DIAGNOSIS — I129 Hypertensive chronic kidney disease with stage 1 through stage 4 chronic kidney disease, or unspecified chronic kidney disease: Secondary | ICD-10-CM | POA: Diagnosis not present

## 2019-02-23 DIAGNOSIS — E559 Vitamin D deficiency, unspecified: Secondary | ICD-10-CM | POA: Diagnosis not present

## 2019-02-23 DIAGNOSIS — Z940 Kidney transplant status: Secondary | ICD-10-CM | POA: Diagnosis not present

## 2019-02-25 MED FILL — SULFAMETHOXAZOLE-TMP SS TAB: 400-80 | 28 days supply | Qty: 12 | Fill #0

## 2019-02-27 MED FILL — ELIQUIS 5 MG TABLET: 5 | 90 days supply | Qty: 180 | Fill #0

## 2019-02-27 MED FILL — predniSONE 5 MG TABS: 5 | 30 days supply | Qty: 30 | Fill #0

## 2019-02-27 MED FILL — cloNIDine HCL 0.2 MG TABS: 0.2 | 30 days supply | Qty: 30 | Fill #0

## 2019-02-27 MED FILL — VASCEPA 1 GM CAPSULE: 1 | 30 days supply | Qty: 120 | Fill #0

## 2019-02-27 MED FILL — ROSUVASTATIN CALCIUM 10 MG: 10 | 30 days supply | Qty: 30 | Fill #0

## 2019-03-01 MED FILL — MYCOPHENOLATE SODIUM 180 MG: 180 | 30 days supply | Qty: 240 | Fill #0

## 2019-03-02 DIAGNOSIS — F4320 Adjustment disorder, unspecified: Secondary | ICD-10-CM | POA: Diagnosis not present

## 2019-03-04 MED FILL — ALLOPURINOL 100 MG TABS: 100 | 30 days supply | Qty: 60 | Fill #0

## 2019-03-05 MED FILL — OMEPRAZOLE 20 MG CPDR: 20 | 30 days supply | Qty: 30 | Fill #0

## 2019-03-05 MED FILL — METOPROLOL TARTRATE 25 MG T: 25 | 30 days supply | Qty: 30 | Fill #0

## 2019-03-06 MED FILL — MYCOPHENOLATE SODIUM 180 MG: 180 | 30 days supply | Qty: 240 | Fill #1

## 2019-03-08 ENCOUNTER — Telehealth (HOSPITAL_COMMUNITY): Payer: Self-pay | Admitting: *Deleted

## 2019-03-08 ENCOUNTER — Other Ambulatory Visit (HOSPITAL_COMMUNITY): Payer: Self-pay | Admitting: *Deleted

## 2019-03-08 MED ORDER — DILTIAZEM HCL 60 MG PO TABS: ORAL_TABLET | ORAL | 1 refills | Status: DC

## 2019-03-08 NOTE — Telephone Encounter (Signed)
Patient called in stating he went back into AF yesterday. His rates were in the mid-100s yesterday but slightly better today. He did not take any PRN cardizem as they were expired. I have sent a new prescription in for this. Patient states he feels awful when in AF. He will try increasing amiodarone to 200mg  BID and be seen to discuss cardioversion on Thursday. Pt has not missed any doses of eliquis. Pt in agreement with plan - if he should return to NSR prior to appt he will call and cancel.

## 2019-03-09 MED FILL — TACROLIMUS ANHYDROUS 0.5MG: 0.5 | 30 days supply | Qty: 90 | Fill #0

## 2019-03-11 ENCOUNTER — Encounter (HOSPITAL_COMMUNITY): Payer: Self-pay | Admitting: Physician Assistant

## 2019-03-11 ENCOUNTER — Ambulatory Visit (HOSPITAL_COMMUNITY)
Admission: RE | Admit: 2019-03-11 | Discharge: 2019-03-11 | Disposition: A | Discharge status: Home / Self Care | Payer: 59 | Source: Ambulatory Visit | Attending: Physician Assistant | Admitting: Physician Assistant

## 2019-03-11 ENCOUNTER — Other Ambulatory Visit: Payer: Self-pay

## 2019-03-11 ENCOUNTER — Encounter (HOSPITAL_COMMUNITY): Payer: Self-pay | Admitting: *Deleted

## 2019-03-11 VITALS — BP 98/62 | HR 69 | Ht 68.0 in | Wt 193.8 lb

## 2019-03-11 DIAGNOSIS — I4892 Unspecified atrial flutter: Secondary | ICD-10-CM | POA: Insufficient documentation

## 2019-03-11 DIAGNOSIS — M109 Gout, unspecified: Secondary | ICD-10-CM | POA: Insufficient documentation

## 2019-03-11 DIAGNOSIS — E785 Hyperlipidemia, unspecified: Secondary | ICD-10-CM | POA: Insufficient documentation

## 2019-03-11 DIAGNOSIS — R739 Hyperglycemia, unspecified: Secondary | ICD-10-CM | POA: Diagnosis not present

## 2019-03-11 DIAGNOSIS — Z888 Allergy status to other drugs, medicaments and biological substances status: Secondary | ICD-10-CM | POA: Diagnosis not present

## 2019-03-11 DIAGNOSIS — I480 Paroxysmal atrial fibrillation: Secondary | ICD-10-CM | POA: Diagnosis not present

## 2019-03-11 DIAGNOSIS — I422 Other hypertrophic cardiomyopathy: Secondary | ICD-10-CM | POA: Insufficient documentation

## 2019-03-11 DIAGNOSIS — Z880 Allergy status to penicillin: Secondary | ICD-10-CM | POA: Diagnosis not present

## 2019-03-11 DIAGNOSIS — Z7952 Long term (current) use of systemic steroids: Secondary | ICD-10-CM | POA: Insufficient documentation

## 2019-03-11 DIAGNOSIS — Z79899 Other long term (current) drug therapy: Secondary | ICD-10-CM | POA: Insufficient documentation

## 2019-03-11 DIAGNOSIS — E559 Vitamin D deficiency, unspecified: Secondary | ICD-10-CM | POA: Insufficient documentation

## 2019-03-11 DIAGNOSIS — Z8249 Family history of ischemic heart disease and other diseases of the circulatory system: Secondary | ICD-10-CM | POA: Insufficient documentation

## 2019-03-11 DIAGNOSIS — I129 Hypertensive chronic kidney disease with stage 1 through stage 4 chronic kidney disease, or unspecified chronic kidney disease: Secondary | ICD-10-CM | POA: Insufficient documentation

## 2019-03-11 DIAGNOSIS — N189 Chronic kidney disease, unspecified: Secondary | ICD-10-CM | POA: Insufficient documentation

## 2019-03-11 DIAGNOSIS — R9431 Abnormal electrocardiogram [ECG] [EKG]: Secondary | ICD-10-CM | POA: Insufficient documentation

## 2019-03-11 DIAGNOSIS — Z940 Kidney transplant status: Secondary | ICD-10-CM | POA: Diagnosis not present

## 2019-03-11 DIAGNOSIS — Z87891 Personal history of nicotine dependence: Secondary | ICD-10-CM | POA: Insufficient documentation

## 2019-03-11 DIAGNOSIS — I447 Left bundle-branch block, unspecified: Secondary | ICD-10-CM | POA: Diagnosis not present

## 2019-03-11 DIAGNOSIS — Z91041 Radiographic dye allergy status: Secondary | ICD-10-CM | POA: Diagnosis not present

## 2019-03-11 DIAGNOSIS — I25810 Atherosclerosis of coronary artery bypass graft(s) without angina pectoris: Secondary | ICD-10-CM | POA: Diagnosis not present

## 2019-03-11 DIAGNOSIS — Z7901 Long term (current) use of anticoagulants: Secondary | ICD-10-CM | POA: Diagnosis not present

## 2019-03-11 DIAGNOSIS — Z951 Presence of aortocoronary bypass graft: Secondary | ICD-10-CM | POA: Diagnosis not present

## 2019-03-11 DIAGNOSIS — I484 Atypical atrial flutter: Secondary | ICD-10-CM | POA: Diagnosis not present

## 2019-03-11 NOTE — Progress Notes (Signed)
Primary Care Physician: Patient, No Pcp Per Referring Physician: Dr. Rayann Heman EP: Dr. Raylene Miyamoto [name] is a 58 y.o. male with a h/o  afib ablation 9/20/ 2018 and aflutter ablation in 2013.Marland Kitchen After last ablation, pt had  symptomatic arrhythmias. He was seen by Dr. Rayann Heman and started on amiodarone . This and cardioversion restored SR.    Patient reports that three days ago, he felt that he was out of rhythm with more exertional fatigue. He increased his amiodarone to BID and took a diltiazem 60 mg. Since then, he states that he feels that he has been in and out of normal rhythm with intermittent symptoms.  Today, he denies symptoms of chest pain, shortness of breath, orthopnea, PND, lower extremity edema, dizziness, presyncope, syncope, or neurologic sequela. The patient is tolerating medications without difficulties and is otherwise without complaint today.   Past Medical History:  Diagnosis Date  . Arteriovenous fistula, acquired ([place])   . Atrial fibrillation and flutter ([place])   . Bladder stones   . BMI 31.0-31.9,adult   . CAD (coronary artery disease)    a. s/p CABG 03/2012: LIMA to LAD, free RIMA to OM1, SVG to D1, sequential SVG to AM and LPLB2, EVH via right thigh and leg.  . Cellulitis 04/13/2012   RLE saphenous vein harvest incision   . CKD (chronic kidney disease)   . Dyslipidemia   . Gout   . History of kidney transplant   . Hyperglycemia   . Hyperlipidemia   . Hyperparathyroidism   . Hypertension   . Hypertrophic cardiomyopathy ([place])   . Hypomagnesemia   . Immunosuppression ([place])   . Intermittent palpitations   . Microscopic hematuria   . Migraines   . PAF and Flutter    a. Afib post-op CABG; AFlutter 10/2012  . Peripheral vascular disease ([place])   . Post-transplant erythrocytosis   . Renal disease   . S/P living-donor kidney transplantation   . Sinus node dysfunction-post termination pause    a. >8sec in setting of aflutter 10/2012  . Sleep pattern disturbance    . Stroke [facility]) 1995; 2001   denies residual  . Superficial vein thrombosis    a. Steven greater saphenous 04/2012  . Vitamin D deficiency   . VT (ventricular tachycardia) ([place])    Past Surgical History:  Procedure Laterality Date  . ATRIAL FIBRILLATION ABLATION N/A 08/21/2017   Procedure: Atrial Fibrillation Ablation;  Surgeon: [name], MD;  Location: [place] CV LAB;  Service: Cardiovascular;  Laterality: N/A;  . ATRIAL FLUTTER ABLATION N/A 11/09/2012   Procedure: ATRIAL FLUTTER ABLATION;  Surgeon: [name], MD;  Location: [facility] CATH LAB;  Service: Cardiovascular;  Laterality: N/A;  . AV FISTULA PLACEMENT  2011   left forearm  . AV FISTULA PLACEMENT    . CARDIAC CATHETERIZATION  03/16/12  . CARDIOVERSION N/A 06/10/2017   Procedure: CARDIOVERSION;  Surgeon: Pixie Casino, MD;  Location: [facility] ENDOSCOPY;  Service: Cardiovascular;  Laterality: N/A;  . CARDIOVERSION N/A 10/13/2017   Procedure: CARDIOVERSION;  Surgeon: Dorothy Spark, MD;  Location: [facility] ENDOSCOPY;  Service: Cardiovascular;  Laterality: N/A;  . CARDIOVERSION N/A 10/08/2018   Procedure: CARDIOVERSION;  Surgeon: Larey Dresser, MD;  Location: [facility] ENDOSCOPY;  Service: Cardiovascular;  Laterality: N/A;  . CORONARY ARTERY BYPASS GRAFT  03/19/2012   Procedure: CORONARY ARTERY BYPASS GRAFTING (CABG);  Surgeon: Rexene Alberts, MD;  Location: [place];  Service: Open Heart Surgery;  Laterality: N/A;  (B) MAMMARY  .  CYSTOSCOPY WITH BIOPSY N/A 08/15/2016   Procedure: CYSTOSCOPY WITH IDENTIFICATION OF RIGHT TRANSPLANTATION OF URETRAL ORFICE WITH FLUORESCEIN;  Surgeon: Carolan Clines, MD;  [facility];  Service: Urology;  Laterality: N/A;  . DENTAL SURGERY  2008   multiple  . [place]   left  . FRACTURE SURGERY    . HERNIA REPAIR  35/7017   umbilical  . HOLMIUM LASER APPLICATION N/A 7/93/9030   Procedure: HOLMIUM LASER APPLICATION WITH 3 CM RIGHT BLADDER STONE;  Surgeon: Carolan Clines, MD;   [facility];  Service: Urology;  Laterality: N/A;  . INGUINAL HERNIA REPAIR  09/2009   bilaterally  . KIDNEY TRANSPLANT    . LEFT HEART CATHETERIZATION WITH CORONARY ANGIOGRAM N/A 03/16/2012   Procedure: LEFT HEART CATHETERIZATION WITH CORONARY ANGIOGRAM;  Surgeon: Sherren Mocha, MD;  Location: [facility] CATH LAB;  Service: Cardiovascular;  Laterality: N/A;  . RENAL ARTERY STENT  2001  . TEE WITHOUT CARDIOVERSION N/A 08/21/2017   Procedure: TRANSESOPHAGEAL ECHOCARDIOGRAM (TEE);  Surgeon: Larey Dresser, MD;  Location: [facility] ENDOSCOPY;  Service: Cardiovascular;  Laterality: N/A;  . TRANSURETHRAL RESECTION OF BLADDER TUMOR N/A 08/15/2016   Procedure: TRANSURETHRAL BIOPSY OF RIGHT BLADDER WALL;  Surgeon: Carolan Clines, MD;  [facility];  Service: Urology;  Laterality: N/A;    Current Outpatient Medications  Medication Sig Dispense Refill  . allopurinol (ZYLOPRIM) 100 MG tablet Take 200 mg by mouth daily.     Marland Kitchen amiodarone (PACERONE) 200 MG tablet TAKE 1 TABLET (200 MG TOTAL) BY MOUTH TWICE A DAY 180 tablet 3  . cloNIDine (CATAPRES) 0.2 MG tablet Take 0.2 mg by mouth at bedtime.     Marland Kitchen diltiazem (CARDIZEM) 60 MG tablet Take 1 tablet as needed every 6 hours for rapid afib HR over 100. 45 tablet 1  . ELIQUIS 5 MG TABS tablet TAKE 1 TABLET BY MOUTH 2 TIMES DAILY. 60 tablet 9  . fenofibrate micronized (LOFIBRA) 134 MG capsule Take 134 mg by mouth daily.     . fluticasone (FLONASE) 50 MCG/ACT nasal spray Place 1 spray into both nostrils daily.    Vanessa Kick Ethyl (VASCEPA) 1 g CAPS Take 2 capsules (2 g total) by mouth 2 (two) times daily. 120 capsule 11  . magnesium oxide (MAG-OX) 400 (241.3 Mg) MG tablet Take 400 mg by mouth 2 (two) times daily.    . metoprolol tartrate (LOPRESSOR) 25 MG tablet Take 25 mg by mouth daily.     . mycophenolate (MYFORTIC) 180 MG EC tablet Take 720 mg by mouth 2 (two) times daily.     Marland Kitchen omeprazole (PRILOSEC) 20 MG capsule Take 20 mg by mouth daily.  5  . predniSONE  (DELTASONE) 5 MG tablet Take 5 mg by mouth at bedtime.    . rosuvastatin (CRESTOR) 10 MG tablet Take 1 tablet (10 mg total) by mouth daily. 30 tablet 11  . sulfamethoxazole-trimethoprim (BACTRIM,SEPTRA) 400-80 MG tablet Take 1 tablet by mouth every Monday, Wednesday, and Friday.  6  . tacrolimus (PROGRAF) 0.5 MG capsule Take 0.5 mg by mouth every evening.  2  . tacrolimus (PROGRAF) 1 MG capsule Take 1 mg by mouth every morning.   5  . tadalafil (CIALIS) 5 MG tablet Take 5 mg by mouth at bedtime.   11  . Vitamin D, Ergocalciferol, (DRISDOL) 50000 units CAPS capsule Take 50,000 Units by mouth 2 (two) times a week. Tuesdays and Saturdays  5   No current facility-administered medications for this encounter.  Allergies  Allergen Reactions  . Contrast Media [Iodinated Diagnostic Agents] Other (See Comments)    Pt states that this medication causes him to code.   Marland Kitchen Penicillins Itching, Rash and Other (See Comments)    Has patient had a PCN reaction causing immediate rash, facial/tongue/throat swelling, SOB or lightheadedness with hypotension: No Has patient had a PCN reaction causing severe rash involving mucus membranes or skin necrosis: No Has patient had a PCN reaction that required hospitalization No Has patient had a PCN reaction occurring within the last 10 years: No If all of the above answers are "NO", then may proceed with Cephalosporin use.  Marland Kitchen Lisinopril     Increased Cr    Social History   Socioeconomic History  . Marital status: Married    Spouse name: Not on file  . Number of children: Not on file  . Years of education: Not on file  . Highest education level: Not on file  Occupational History  . Not on file  Social Needs  . Financial resource strain: Not on file  . Food insecurity:    Worry: Not on file    Inability: Not on file  . Transportation needs:    Medical: Not on file    Non-medical: Not on file  Tobacco Use  . Smoking status: Former Smoker    Packs/day:  0.30    Years: 30.00    Pack years: 9.00    Types: Cigarettes    Last attempt to quit: 03/15/2012    Years since quitting: 6.9  . Smokeless tobacco: Never Used  Substance and Sexual Activity  . Alcohol use: Yes    Comment: 04/13/12 "2 mixed drinks/month"  . Drug use: No  . Sexual activity: Yes  Lifestyle  . Physical activity:    Days per week: Not on file    Minutes per session: Not on file  . Stress: Not on file  Relationships  . Social connections:    Talks on phone: Not on file    Gets together: Not on file    Attends religious service: Not on file    Active member of club or organization: Not on file    Attends meetings of clubs or organizations: Not on file    Relationship status: Not on file  . Intimate partner violence:    Fear of current or ex partner: Not on file    Emotionally abused: Not on file    Physically abused: Not on file    Forced sexual activity: Not on file  Other Topics Concern  . Not on file  Social History Narrative  . Not on file    Family History  Adopted: Yes  Problem Relation Age of Onset  . Hypertension Other     ROS- All systems are reviewed and negative except as per the HPI above  Physical Exam: There were no vitals filed for this visit. Wt Readings from Last 3 Encounters:  11/19/18 83.9 kg  10/21/18 78 kg  09/28/18 77.4 kg    Labs: Lab Results  Component Value Date   NA 137 09/28/2018   K 4.1 09/28/2018   CL 102 09/28/2018   CO2 26 09/28/2018   GLUCOSE 118 (H) 09/28/2018   BUN 19 09/28/2018   CREATININE 1.51 (H) 09/28/2018   CALCIUM 9.6 09/28/2018   PHOS 6.9 (H) 04/16/2012   MG 1.7 06/14/2017   Lab Results  Component Value Date   INR 1.07 03/01/2017   No results found for: CHOL,  HDL, LDLCALC, TRIG  GEN- The patient is well appearing, alert and oriented x 3 today.   HEENT-head normocephalic, atraumatic, sclera clear, conjunctiva pink, hearing intact, trachea midline. Lungs- Clear to ausculation bilaterally, normal  work of breathing Heart- irregular rate and rhythm, no murmurs, rubs or gallops  GI- soft, NT, ND, + BS Extremities- no clubbing, cyanosis, or edema MS- no significant deformity or atrophy Skin- no rash or lesion Psych- euthymic mood, full affect Neuro- strength and sensation are intact   EKG- atypical atrial flutter with variable block. LBBB, QRS 132, QTc 495  Epic records reviewed   Assessment and Plan: 1. Atrial fibrillation/atypical flutter  S/p ablation 08/2017 with Dr Rayann Heman. Patient appears to be paroxysmal so will not proceed with DCCV. Will give him a few weeks to load on higher dose of amiodarone. If he becomes persistent and highly symptomatic will arrange for DCCV. Per Dr Jackalyn Lombard procedure note "doubtful additional ablation would be beneficial." Continue amiodarone 200 mg BID Continue diltiazem 60 mg PRN q 6 hrs. Continue Eliquis 5 mg BID  This patients CHA2DS2-VASc Score and unadjusted Ischemic Stroke Rate (% per year) is equal to 7.2 % stroke rate/year from a score of 5  Above score calculated as 1 point each if present [CHF, HTN, DM, Vascular=MI/PAD/Aortic Plaque, Age if 65-74, or Male] Above score calculated as 2 points each if present [Age > 75, or Stroke/TIA/TE]  2. CAD s/p CABG No anginal symptoms. Continue present therapy.  3. HCM Stable, no changes today  4. HTN Stable, no changes today.  5. S/p renal transplant   Follow up in 2-3 weeks with telehealth visit with AF clinic.   [facility] [address] [place],  [address] [phone]

## 2019-03-12 ENCOUNTER — Telehealth: Payer: Self-pay | Admitting: Pharmacist

## 2019-03-12 MED ORDER — ROSUVASTATIN CALCIUM 20 MG PO TABS: 20.0000 mg | ORAL_TABLET | Freq: Every day | ORAL | 3 refills | Status: DC

## 2019-03-12 MED FILL — ROSUVASTATIN CALCIUM 20 MG: 20 | 90 days supply | Qty: 90 | Fill #0

## 2019-03-12 NOTE — Telephone Encounter (Signed)
Called pt to discuss rosuvastatin 10mg  tolerability. Pt reports tolerating statin well and is willing to increase dose to 20mg  daily. Refill sent in. He is tolerating Vascepa well also. His kidney doctor checks monthly labs and pt reports he will check his cholesterol if asked - advised pt we would like lipids checked in 2 months.

## 2019-03-16 DIAGNOSIS — F4320 Adjustment disorder, unspecified: Secondary | ICD-10-CM | POA: Diagnosis not present

## 2019-03-18 MED FILL — VIT D2 1.25 MG (50,000 UNIT: 1.25 MG | 28 days supply | Qty: 8 | Fill #0

## 2019-03-25 MED FILL — cloNIDine HCL 0.2 MG TABS: 0.2 | 30 days supply | Qty: 30 | Fill #0

## 2019-03-26 MED FILL — SULFAMETHOXAZOLE-TMP SS TAB: 400-80 | 28 days supply | Qty: 12 | Fill #1

## 2019-03-26 MED FILL — VASCEPA 1 GM CAPSULE: 1 | 30 days supply | Qty: 120 | Fill #1

## 2019-03-26 MED FILL — AMIODARONE HCL 200 MG TAB: 200 | 90 days supply | Qty: 180 | Fill #0

## 2019-03-26 MED FILL — predniSONE 5 MG TABS: 5 | 30 days supply | Qty: 30 | Fill #1

## 2019-03-26 MED FILL — TADALAFIL 5 MG TABS: 5 | 30 days supply | Qty: 30 | Fill #0

## 2019-03-30 ENCOUNTER — Ambulatory Visit (HOSPITAL_COMMUNITY)
Admission: RE | Admit: 2019-03-30 | Discharge: 2019-03-30 | Disposition: A | Discharge status: Home / Self Care | Payer: 59 | Source: Ambulatory Visit | Attending: Physician Assistant | Admitting: Physician Assistant

## 2019-03-30 ENCOUNTER — Encounter (HOSPITAL_COMMUNITY): Payer: Self-pay | Admitting: Physician Assistant

## 2019-03-30 ENCOUNTER — Encounter (HOSPITAL_COMMUNITY): Payer: Self-pay

## 2019-03-30 ENCOUNTER — Other Ambulatory Visit: Payer: Self-pay

## 2019-03-30 ENCOUNTER — Other Ambulatory Visit (HOSPITAL_COMMUNITY): Payer: Self-pay | Admitting: *Deleted

## 2019-03-30 VITALS — BP 125/86 | HR 102 | Ht 68.0 in | Wt 190.0 lb

## 2019-03-30 DIAGNOSIS — I484 Atypical atrial flutter: Secondary | ICD-10-CM | POA: Diagnosis not present

## 2019-03-30 DIAGNOSIS — F4320 Adjustment disorder, unspecified: Secondary | ICD-10-CM | POA: Diagnosis not present

## 2019-03-30 NOTE — H&P (View-Only) (Signed)
Electrophysiology TeleHealth Note   Due to national recommendations of social distancing due to [place] 19, Audio/video telehealth visit is felt to be most appropriate for this patient at this time.  See MyChart message from today for patient consent regarding telehealth for the Atrial Fibrillation Clinic.    Date:  03/30/2019   ID:  [name], DOB [birth year], MRN [id number]  Location: home  Provider location: [address] [place], [place] [address] Evaluation Performed: Follow up  PCP:  Patient, No Pcp Per  Primary Electrophysiologist: Dr Caryl Comes   CC: Follow up for atrial fibrillation/atrial flutter   History of Present Illness: [name] is a 58 y.o. male who presents via audio/video conferencing for a telehealth visit today. Patient reports that he is about the same since his last visit. He notes on his Kardia device that he is now persistently out of rhythm. He is mostly unaware of his arrhythmia except at night when he can feel palpitations. His heart rate is 90s-low 100s when he checks. He denies any symptoms of heart failure, CP, or syncope.   Today, he denies symptoms of chest pain, shortness of breath, orthopnea, PND, lower extremity edema, claudication, dizziness, presyncope, syncope, bleeding, or neurologic sequela. The patient is tolerating medications without difficulties and is otherwise without complaint today.   he denies symptoms of cough, fevers, chills, or new SOB worrisome for COVID 19.     he has a BMI of Body mass index is 28.89 kg/m.Marland Kitchen Filed Weights   03/30/19 1408  Weight: 86.2 kg   BP 125/86 Pulse 102 Provided by pt with home BP machine  Past Medical History:  Diagnosis Date  . Arteriovenous fistula, acquired ([place])   . Atrial fibrillation and flutter ([place])   . Bladder stones   . BMI 31.0-31.9,adult   . CAD (coronary artery disease)    a. s/p CABG 03/2012: LIMA to LAD, free RIMA to OM1, SVG to D1, sequential SVG to AM and LPLB2, EVH via right  thigh and leg.  . Cellulitis 04/13/2012   RLE saphenous vein harvest incision   . CKD (chronic kidney disease)   . Dyslipidemia   . Gout   . History of kidney transplant   . Hyperglycemia   . Hyperlipidemia   . Hyperparathyroidism   . Hypertension   . Hypertrophic cardiomyopathy ([place])   . Hypomagnesemia   . Immunosuppression ([place])   . Intermittent palpitations   . Microscopic hematuria   . Migraines   . PAF and Flutter    a. Afib post-op CABG; AFlutter 10/2012  . Peripheral vascular disease ([place])   . Post-transplant erythrocytosis   . Renal disease   . S/P living-donor kidney transplantation   . Sinus node dysfunction-post termination pause    a. >8sec in setting of aflutter 10/2012  . Sleep pattern disturbance   . Stroke [facility]) 1995; 2001   denies residual  . Superficial vein thrombosis    a. R greater saphenous 04/2012  . Vitamin D deficiency   . VT (ventricular tachycardia) ([place])    Past Surgical History:  Procedure Laterality Date  . ATRIAL FIBRILLATION ABLATION N/A 08/21/2017   Procedure: Atrial Fibrillation Ablation;  Surgeon: [name], MD;  Location: [place] CV LAB;  Service: Cardiovascular;  Laterality: N/A;  . ATRIAL FLUTTER ABLATION N/A 11/09/2012   Procedure: ATRIAL FLUTTER ABLATION;  Surgeon: [name], MD;  Location: [facility] CATH LAB;  Service: Cardiovascular;  Laterality: N/A;  . AV FISTULA PLACEMENT  2011   left forearm  . AV FISTULA PLACEMENT    . CARDIAC CATHETERIZATION  03/16/12  . CARDIOVERSION N/A 06/10/2017   Procedure: CARDIOVERSION;  Surgeon: Pixie Casino, MD;  Location: [facility] ENDOSCOPY;  Service: Cardiovascular;  Laterality: N/A;  . CARDIOVERSION N/A 10/13/2017   Procedure: CARDIOVERSION;  Surgeon: Dorothy Spark, MD;  Location: [facility] ENDOSCOPY;  Service: Cardiovascular;  Laterality: N/A;  . CARDIOVERSION N/A 10/08/2018   Procedure: CARDIOVERSION;  Surgeon: Larey Dresser, MD;  Location: [facility] ENDOSCOPY;  Service: Cardiovascular;  Laterality:  N/A;  . CORONARY ARTERY BYPASS GRAFT  03/19/2012   Procedure: CORONARY ARTERY BYPASS GRAFTING (CABG);  Surgeon: Rexene Alberts, MD;  Location: [place];  Service: Open Heart Surgery;  Laterality: N/A;  (B) MAMMARY  . CYSTOSCOPY WITH BIOPSY N/A 08/15/2016   Procedure: CYSTOSCOPY WITH IDENTIFICATION OF RIGHT TRANSPLANTATION OF URETRAL ORFICE WITH FLUORESCEIN;  Surgeon: Carolan Clines, MD;  [facility];  Service: Urology;  Laterality: N/A;  . DENTAL SURGERY  2008   multiple  . [place]   left  . FRACTURE SURGERY    . HERNIA REPAIR  [id number]/[id number]   umbilical  . HOLMIUM LASER APPLICATION N/A 2/84/1324   Procedure: HOLMIUM LASER APPLICATION WITH 3 CM RIGHT BLADDER STONE;  Surgeon: Carolan Clines, MD;  [facility];  Service: Urology;  Laterality: N/A;  . INGUINAL HERNIA REPAIR  09/2009   bilaterally  . KIDNEY TRANSPLANT    . LEFT HEART CATHETERIZATION WITH CORONARY ANGIOGRAM N/A 03/16/2012   Procedure: LEFT HEART CATHETERIZATION WITH CORONARY ANGIOGRAM;  Surgeon: Sherren Mocha, MD;  Location: [facility] CATH LAB;  Service: Cardiovascular;  Laterality: N/A;  . RENAL ARTERY STENT  2001  . TEE WITHOUT CARDIOVERSION N/A 08/21/2017   Procedure: TRANSESOPHAGEAL ECHOCARDIOGRAM (TEE);  Surgeon: Larey Dresser, MD;  Location: [facility] ENDOSCOPY;  Service: Cardiovascular;  Laterality: N/A;  . TRANSURETHRAL RESECTION OF BLADDER TUMOR N/A 08/15/2016   Procedure: TRANSURETHRAL BIOPSY OF RIGHT BLADDER WALL;  Surgeon: Carolan Clines, MD;  [facility];  Service: Urology;  Laterality: N/A;     Current Outpatient Medications  Medication Sig Dispense Refill  . allopurinol (ZYLOPRIM) 100 MG tablet Take 200 mg by mouth daily.     Marland Kitchen amiodarone (PACERONE) 200 MG tablet Take 200 mg by mouth daily.    . cloNIDine (CATAPRES) 0.2 MG tablet Take 0.2 mg by mouth at bedtime.     Marland Kitchen diltiazem (CARDIZEM) 60 MG tablet Take 1 tablet as needed every 6 hours for rapid afib HR over 100. 45 tablet 1  .  ELIQUIS 5 MG TABS tablet TAKE 1 TABLET BY MOUTH 2 TIMES DAILY. 60 tablet 9  . fenofibrate micronized (LOFIBRA) 134 MG capsule Take 134 mg by mouth daily.     . fluticasone (FLONASE) 50 MCG/ACT nasal spray Place 1 spray into both nostrils daily.    Vanessa Kick Ethyl (VASCEPA) 1 g CAPS Take 2 capsules (2 g total) by mouth 2 (two) times daily. 120 capsule 11  . magnesium oxide (MAG-OX) 400 (241.3 Mg) MG tablet Take 400 mg by mouth 2 (two) times daily.    . metoprolol tartrate (LOPRESSOR) 25 MG tablet Take 25 mg by mouth daily.     . mycophenolate (MYFORTIC) 180 MG EC tablet Take 720 mg by mouth 2 (two) times daily.     Marland Kitchen omeprazole (PRILOSEC) 20 MG capsule Take 20 mg by mouth daily.  5  . predniSONE (DELTASONE) 5 MG tablet Take 5 mg by mouth  at bedtime.    . rosuvastatin (CRESTOR) 20 MG tablet Take 1 tablet (20 mg total) by mouth daily. 90 tablet 3  . sulfamethoxazole-trimethoprim (BACTRIM,SEPTRA) 400-80 MG tablet Take 1 tablet by mouth every Monday, Wednesday, and Friday.  6  . tacrolimus (PROGRAF) 0.5 MG capsule Take 0.5 mg by mouth every evening.  2  . tadalafil (CIALIS) 5 MG tablet Take 5 mg by mouth at bedtime.   11  . Vitamin D, Ergocalciferol, (DRISDOL) 50000 units CAPS capsule Take 50,000 Units by mouth 2 (two) times a week. Tuesdays and Saturdays  5  . tacrolimus (PROGRAF) 1 MG capsule Take 1 mg by mouth every morning.   5   No current facility-administered medications for this encounter.     Allergies:   Contrast media [iodinated diagnostic agents]; Penicillins; and Lisinopril   Social History:  The patient  reports that he quit smoking about 7 years ago. His smoking use included cigarettes. He has a 9.00 pack-year smoking history. He has never used smokeless tobacco. He reports current alcohol use. He reports that he does not use drugs.   Family History:  The patient's  family history includes Hypertension in an other family member. He was adopted.    ROS:  Please see the history of  present illness.   All other systems are personally reviewed and negative.   Exam: Well appearing, alert and conversant, regular work of breathing,  good skin color  Recent Labs: 09/28/2018: ALT 50; BUN 19; Creatinine, Ser 1.51; Hemoglobin 17.0; Platelets 180; Potassium 4.1; Sodium 137; TSH 3.025  personally reviewed    Other studies personally reviewed: Additional studies/ records that were reviewed today include: Epic notes   The patient presents wearable device technology report for my review today. On my review, the patient presents Kardia tracings from today. The tracings reveal atrial fibrillation vs atypical atrial flutter with variable conduction HR 94   ASSESSMENT AND PLAN:  1.  Persistent atrial fibrillation/atypical atrial flutter Patient is now persistent despite loading on amiodarone. Symptomatically, patient does not feel different and is mostly unaware of arrhythmia. Will increase Lopressor to 25 mg BID for better rate control. If patient becomes hypotensive, can stop diltiazem as he reports it doesn't seem to affect his heart rate anyway. Will decrease amiodarone back to once daily in one week. Continue Eliquis 5 mg BID We discussed therapeutic options including cardioversion. Given patient is stable with no symptoms of heart failure, chest pain, presyncope, or syncope, would not recommend DCCV at this time. Anticipate DCCV once COVID-19 precautions end or if patient clinical status deteriorates.   This patients CHA2DS2-VASc Score and unadjusted Ischemic Stroke Rate (% per year) is equal to 7.2 % stroke rate/year from a score of 5  Above score calculated as 1 point each if present [CHF, HTN, DM, Vascular=MI/PAD/Aortic Plaque, Age if 65-74, or Male] Above score calculated as 2 points each if present [Age > 75, or Stroke/TIA/TE]  2. CAD s/p CAGB No anginal symptoms, continue present therapy.  3. HTN Stable, no changes today.  4. S/p renal transplant   COVID  screen The patient does not have any symptoms that suggest any further testing/ screening at this time.  Social distancing reinforced today.   Follow-up with Dr Caryl Comes as scheduled.  Current medicines are reviewed at length with the patient today.   The patient does not have concerns regarding his medicines.  The following changes were made today:  Increase metoprolol, decrease amiodarone  Labs/ tests ordered today  include:  No orders of the defined types were placed in this encounter.   Patient Risk:  after full review of this patients clinical status, I feel that they are at moderate risk at this time.   Today, I have spent 28 minutes with the patient with telehealth technology discussing atrial fibrillation, medications, cardioversion, and COVID-19 precautions.    Gwenlyn Perking PA-C 03/30/2019 2:59 PM  [facility] [address] [place], [place] [address] [phone]

## 2019-03-30 NOTE — Progress Notes (Signed)
Electrophysiology TeleHealth Note   Due to national recommendations of social distancing due to [place] 19, Audio/video telehealth visit is felt to be most appropriate for this patient at this time.  See MyChart message from today for patient consent regarding telehealth for the At[facility] Fibrillation Clinic.    Date:  03/30/2019   ID:  [name], DOB 1961/02/03, MRN [id number]  Location: home  Provider location: [address] [place], [place] [address] Evaluation Performed: Follow up  PCP:  Patient, No Pcp Per  Primary Electrophysiologist: Dr Caryl Comes   CC: Follow up for atrial fibrillation/atrial flutter   History of Present Illness: [name] is a 58 y.o. male who presents via audio/video conferencing for a telehealth visit today. Patient reports that he is about the same since his last visit. He notes on his Kardia device that he is now persistently out of rhythm. He is mostly unaware of his arrhythmia except at night when he can feel palpitations. His heart rate is 90s-low 100s when he checks. He denies any symptoms of heart failure, CP, or syncope.   Today, he denies symptoms of chest pain, shortness of breath, orthopnea, PND, lower extremity edema, claudication, dizziness, presyncope, syncope, bleeding, or neurologic sequela. The patient is tolerating medications without difficulties and is otherwise without complaint today.   he denies symptoms of cough, fevers, chills, or new SOB worrisome for COVID 19.     he has a BMI of Body mass index is 28.89 kg/m.Marland Kitchen Filed Weights   03/30/19 1408  Weight: 86.2 kg   BP 125/86 Pulse 102 Provided by pt with home BP machine  Past Medical History:  Diagnosis Date  . Arteriovenous fistula, acquired ([place])   . Atrial fibrillation and flutter ([place])   . Bladder stones   . BMI 31.0-31.9,adult   . CAD (coronary artery disease)    a. s/p CABG 03/2012: LIMA to LAD, free RIMA to OM1, SVG to D1, sequential SVG to AM and LPLB2, EVH via right  thigh and leg.  . Cellulitis 04/13/2012   RLE saphenous vein harvest incision   . CKD (chronic kidney disease)   . Dyslipidemia   . Gout   . History of kidney transplant   . Hyperglycemia   . Hyperlipidemia   . Hyperparathyroidism   . Hypertension   . Hypertrophic cardiomyopathy ([place])   . Hypomagnesemia   . Immunosuppression ([place])   . Intermittent palpitations   . Microscopic hematuria   . Migraines   . PAF and Flutter    a. Afib post-op CABG; AFlutter 10/2012  . Peripheral vascular disease ([place])   . Post-transplant erythrocytosis   . Renal disease   . S/P living-donor kidney transplantation   . Sinus node dysfunction-post termination pause    a. >8sec in setting of aflutter 10/2012  . Sleep pattern disturbance   . Stroke [facility]) 1995; 2001   denies residual  . Superficial vein thrombosis    a. R greater saphenous 04/2012  . Vitamin D deficiency   . VT (ventricular tachycardia) ([place])    Past Surgical History:  Procedure Laterality Date  . ATRIAL FIBRILLATION ABLATION N/A 08/21/2017   Procedure: Atrial Fibrillation Ablation;  Surgeon: [name], MD;  Location: [facility] CV LAB;  Service: Cardiovascular;  Laterality: N/A;  . ATRIAL FLUTTER ABLATION N/A 11/09/2012   Procedure: ATRIAL FLUTTER ABLATION;  Surgeon: [name], MD;  Location: [facility] CATH LAB;  Service: Cardiovascular;  Laterality: N/A;  . AV FISTULA PLACEMENT  2011   left forearm  . AV FISTULA PLACEMENT    . CARDIAC CATHETERIZATION  03/16/12  . CARDIOVERSION N/A 06/10/2017   Procedure: CARDIOVERSION;  Surgeon: Pixie Casino, MD;  Location: [facility] ENDOSCOPY;  Service: Cardiovascular;  Laterality: N/A;  . CARDIOVERSION N/A 10/13/2017   Procedure: CARDIOVERSION;  Surgeon: Dorothy Spark, MD;  Location: [facility] ENDOSCOPY;  Service: Cardiovascular;  Laterality: N/A;  . CARDIOVERSION N/A 10/08/2018   Procedure: CARDIOVERSION;  Surgeon: Larey Dresser, MD;  Location: [facility] ENDOSCOPY;  Service: Cardiovascular;  Laterality:  N/A;  . CORONARY ARTERY BYPASS GRAFT  03/19/2012   Procedure: CORONARY ARTERY BYPASS GRAFTING (CABG);  Surgeon: Rexene Alberts, MD;  Location: [place];  Service: Open Heart Surgery;  Laterality: N/A;  (B) MAMMARY  . CYSTOSCOPY WITH BIOPSY N/A 08/15/2016   Procedure: CYSTOSCOPY WITH IDENTIFICATION OF RIGHT TRANSPLANTATION OF URETRAL ORFICE WITH FLUORESCEIN;  Surgeon: Carolan Clines, MD;  Location: [facility] ORS;  Service: Urology;  Laterality: N/A;  . DENTAL SURGERY  2008   multiple  . [place]   left  . FRACTURE SURGERY    . HERNIA REPAIR  23/5361   umbilical  . HOLMIUM LASER APPLICATION N/A 4/43/1540   Procedure: HOLMIUM LASER APPLICATION WITH 3 CM RIGHT BLADDER STONE;  Surgeon: Carolan Clines, MD;  Location: [facility] ORS;  Service: Urology;  Laterality: N/A;  . INGUINAL HERNIA REPAIR  09/2009   bilaterally  . KIDNEY TRANSPLANT    . LEFT HEART CATHETERIZATION WITH CORONARY ANGIOGRAM N/A 03/16/2012   Procedure: LEFT HEART CATHETERIZATION WITH CORONARY ANGIOGRAM;  Surgeon: Sherren Mocha, MD;  Location: [facility] CATH LAB;  Service: Cardiovascular;  Laterality: N/A;  . RENAL ARTERY STENT  2001  . TEE WITHOUT CARDIOVERSION N/A 08/21/2017   Procedure: TRANSESOPHAGEAL ECHOCARDIOGRAM (TEE);  Surgeon: Larey Dresser, MD;  Location: [facility] ENDOSCOPY;  Service: Cardiovascular;  Laterality: N/A;  . TRANSURETHRAL RESECTION OF BLADDER TUMOR N/A 08/15/2016   Procedure: TRANSURETHRAL BIOPSY OF RIGHT BLADDER WALL;  Surgeon: Carolan Clines, MD;  Location: [facility] ORS;  Service: Urology;  Laterality: N/A;     Current Outpatient Medications  Medication Sig Dispense Refill  . allopurinol (ZYLOPRIM) 100 MG tablet Take 200 mg by mouth daily.     Marland Kitchen amiodarone (PACERONE) 200 MG tablet Take 200 mg by mouth daily.    . cloNIDine (CATAPRES) 0.2 MG tablet Take 0.2 mg by mouth at bedtime.     Marland Kitchen diltiazem (CARDIZEM) 60 MG tablet Take 1 tablet as needed every 6 hours for rapid afib HR over 100. 45 tablet 1  .  ELIQUIS 5 MG TABS tablet TAKE 1 TABLET BY MOUTH 2 TIMES DAILY. 60 tablet 9  . fenofibrate micronized (LOFIBRA) 134 MG capsule Take 134 mg by mouth daily.     . fluticasone (FLONASE) 50 MCG/ACT nasal spray Place 1 spray into both nostrils daily.    Vanessa Kick Ethyl (VASCEPA) 1 g CAPS Take 2 capsules (2 g total) by mouth 2 (two) times daily. 120 capsule 11  . magnesium oxide (MAG-OX) 400 (241.3 Mg) MG tablet Take 400 mg by mouth 2 (two) times daily.    . metoprolol tartrate (LOPRESSOR) 25 MG tablet Take 25 mg by mouth daily.     . mycophenolate (MYFORTIC) 180 MG EC tablet Take 720 mg by mouth 2 (two) times daily.     Marland Kitchen omeprazole (PRILOSEC) 20 MG capsule Take 20 mg by mouth daily.  5  . predniSONE (DELTASONE) 5 MG tablet Take 5 mg by mouth  at bedtime.    . rosuvastatin (CRESTOR) 20 MG tablet Take 1 tablet (20 mg total) by mouth daily. 90 tablet 3  . sulfamethoxazole-trimethoprim (BACTRIM,SEPTRA) 400-80 MG tablet Take 1 tablet by mouth every Monday, Wednesday, and Friday.  6  . tacrolimus (PROGRAF) 0.5 MG capsule Take 0.5 mg by mouth every evening.  2  . tadalafil (CIALIS) 5 MG tablet Take 5 mg by mouth at bedtime.   11  . Vitamin D, Ergocalciferol, (DRISDOL) 50000 units CAPS capsule Take 50,000 Units by mouth 2 (two) times a week. Tuesdays and Saturdays  5  . tacrolimus (PROGRAF) 1 MG capsule Take 1 mg by mouth every morning.   5   No current facility-administered medications for this encounter.     Allergies:   Contrast media [iodinated diagnostic agents]; Penicillins; and Lisinopril   Social History:  The patient  reports that he quit smoking about 7 years ago. His smoking use included cigarettes. He has a 9.00 pack-year smoking history. He has never used smokeless tobacco. He reports current alcohol use. He reports that he does not use drugs.   Family History:  The patient's  family history includes Hypertension in an other family member. He was adopted.    ROS:  Please see the history of  present illness.   All other systems are personally reviewed and negative.   Exam: Well appearing, alert and conversant, regular work of breathing,  good skin color  Recent Labs: 09/28/2018: ALT 50; BUN 19; Creatinine, Ser 1.51; Hemoglobin 17.0; Platelets 180; Potassium 4.1; Sodium 137; TSH 3.025  personally reviewed    Other studies personally reviewed: Additional studies/ records that were reviewed today include: Epic notes   The patient presents wearable device technology report for my review today. On my review, the patient presents Kardia tracings from today. The tracings reveal atrial fibrillation vs atypical atrial flutter with variable conduction HR 94   ASSESSMENT AND PLAN:  1.  Persistent atrial fibrillation/atypical atrial flutter Patient is now persistent despite loading on amiodarone. Symptomatically, patient does not feel different and is mostly unaware of arrhythmia. Will increase Lopressor to 25 mg BID for better rate control. If patient becomes hypotensive, can stop diltiazem as he reports it doesn't seem to affect his heart rate anyway. Will decrease amiodarone back to once daily in one week. Continue Eliquis 5 mg BID We discussed therapeutic options including cardioversion. Given patient is stable with no symptoms of heart failure, chest pain, presyncope, or syncope, would not recommend DCCV at this time. Anticipate DCCV once COVID-19 precautions end or if patient clinical status deteriorates.   This patients CHA2DS2-VASc Score and unadjusted Ischemic Stroke Rate (% per year) is equal to 7.2 % stroke rate/year from a score of 5  Above score calculated as 1 point each if present [CHF, HTN, DM, Vascular=MI/PAD/Aortic Plaque, Age if 65-74, or Male] Above score calculated as 2 points each if present [Age > 75, or Stroke/TIA/TE]  2. CAD s/p CAGB No anginal symptoms, continue present therapy.  3. HTN Stable, no changes today.  4. S/p renal transplant   COVID  screen The patient does not have any symptoms that suggest any further testing/ screening at this time.  Social distancing reinforced today.   Follow-up with Dr Caryl Comes as scheduled.  Current medicines are reviewed at length with the patient today.   The patient does not have concerns regarding his medicines.  The following changes were made today:  Increase metoprolol, decrease amiodarone  Labs/ tests ordered today  include:  No orders of the defined types were placed in this encounter.   Patient Risk:  after full review of this patients clinical status, I feel that they are at moderate risk at this time.   Today, I have spent 28 minutes with the patient with telehealth technology discussing atrial fibrillation, medications, cardioversion, and COVID-19 precautions.    Gwenlyn Perking PA-C 03/30/2019 2:59 PM  [facility] [address] [place], [place] [address] [phone]

## 2019-04-01 MED FILL — METOPROLOL TARTRATE 25 MG T: 25 | 30 days supply | Qty: 30 | Fill #1

## 2019-04-01 MED FILL — ALLOPURINOL 100 MG TABS: 100 | 30 days supply | Qty: 60 | Fill #0

## 2019-04-01 MED FILL — OMEPRAZOLE 20 MG CPDR: 20 | 30 days supply | Qty: 30 | Fill #1

## 2019-04-01 MED FILL — MYCOPHENOLIC ACID DR 180 MG: 180 | 30 days supply | Qty: 240 | Fill #2

## 2019-04-08 MED FILL — VIT D2 1.25 MG (50,000 UNIT: 1.25 MG | 28 days supply | Qty: 8 | Fill #1

## 2019-04-13 ENCOUNTER — Telehealth (HOSPITAL_COMMUNITY): Payer: Self-pay | Admitting: *Deleted

## 2019-04-13 ENCOUNTER — Other Ambulatory Visit (HOSPITAL_COMMUNITY): Payer: Self-pay | Admitting: *Deleted

## 2019-04-13 DIAGNOSIS — F4320 Adjustment disorder, unspecified: Secondary | ICD-10-CM | POA: Diagnosis not present

## 2019-04-13 NOTE — Telephone Encounter (Signed)
Pt set up for cardioversion on 5/26. Npo after mn. Instructed to have covid testing on scheduled appt and self quarantine after having testing performed. Pt to have ride home for procedure - driver to wait in car on hospital premises while pt in for procedure. Pt verbalized understanding. No missed doses of eliquis confirmed with patient.

## 2019-04-16 MED FILL — TACROLIMUS ANHYDROUS 0.5MG: 0.5 | 30 days supply | Qty: 90 | Fill #1

## 2019-04-22 ENCOUNTER — Other Ambulatory Visit: Payer: Self-pay

## 2019-04-22 ENCOUNTER — Ambulatory Visit (HOSPITAL_COMMUNITY)
Admission: RE | Admit: 2019-04-22 | Discharge: 2019-04-22 | Disposition: A | Discharge status: Home / Self Care | Payer: 59 | Source: Ambulatory Visit | Attending: Physician Assistant | Admitting: Physician Assistant

## 2019-04-22 DIAGNOSIS — I480 Paroxysmal atrial fibrillation: Secondary | ICD-10-CM | POA: Diagnosis not present

## 2019-04-22 LAB — BASIC METABOLIC PANEL
Anion gap: 7 (ref 5–15)
BUN: 19 mg/dL (ref 6–20)
CO2: 26 mmol/L (ref 22–32)
Calcium: 9.7 mg/dL (ref 8.9–10.3)
Chloride: 107 mmol/L (ref 98–111)
Creatinine, Ser: 1.26 mg/dL — ABNORMAL HIGH (ref 0.61–1.24)
GFR calc Af Amer: >60 mL/min (ref >60)
GFR calc non Af Amer: >60 mL/min (ref >60)
Glucose, Bld: 116 mg/dL — ABNORMAL HIGH (ref 70–99)
Potassium: 4.7 mmol/L (ref 3.5–5.1)
Sodium: 140 mmol/L (ref 135–145)

## 2019-04-22 LAB — CBC WITH DIFFERENTIAL/PLATELET
Abs Immature Granulocytes: 0.09 10*3/uL — ABNORMAL HIGH (ref 0.00–0.07)
Basophils Absolute: 0 10*3/uL (ref 0.0–0.1)
Basophils Relative: 1 %
Eosinophils Absolute: 0 10*3/uL (ref 0.0–0.5)
Eosinophils Relative: 1 %
HCT: 50.1 % (ref 39.0–52.0)
Hemoglobin: 16.1 g/dL (ref 13.0–17.0)
Immature Granulocytes: 1 %
Lymphocytes Relative: 17 %
Lymphs Abs: 1.3 10*3/uL (ref 0.7–4.0)
MCH: 29.2 pg (ref 26.0–34.0)
MCHC: 32.1 g/dL (ref 30.0–36.0)
MCV: 90.8 fL (ref 80.0–100.0)
Monocytes Absolute: 0.9 10*3/uL (ref 0.1–1.0)
Monocytes Relative: 12 %
Neutro Abs: 5.2 10*3/uL (ref 1.7–7.7)
Neutrophils Relative %: 68 %
Platelets: 137 10*3/uL — ABNORMAL LOW (ref 150–400)
RBC: 5.52 MIL/uL (ref 4.22–5.81)
RDW: 14.6 % (ref 11.5–15.5)
WBC: 7.5 10*3/uL (ref 4.0–10.5)
nRBC: 0 % (ref 0.0–0.2)

## 2019-04-22 MED FILL — SULFAMETHOXAZOLE-TMP SS TAB: 400-80 | 28 days supply | Qty: 12 | Fill #2

## 2019-04-22 MED FILL — VASCEPA 1 GM CAPSULE: 1 | 30 days supply | Qty: 120 | Fill #2

## 2019-04-23 ENCOUNTER — Other Ambulatory Visit (HOSPITAL_COMMUNITY)
Admission: RE | Admit: 2019-04-23 | Discharge: 2019-04-23 | Disposition: A | Discharge status: Home / Self Care | Payer: 59 | Source: Ambulatory Visit | Attending: Cardiology | Admitting: Cardiology

## 2019-04-23 DIAGNOSIS — Z01812 Encounter for preprocedural laboratory examination: Secondary | ICD-10-CM | POA: Insufficient documentation

## 2019-04-23 DIAGNOSIS — Z1159 Encounter for screening for other viral diseases: Secondary | ICD-10-CM | POA: Diagnosis not present

## 2019-04-24 LAB — NOVEL CORONAVIRUS, NAA (HOSP ORDER, SEND-OUT TO REF LAB; TAT 18-24 HRS): SARS-CoV-2, NAA: NOT DETECTED

## 2019-04-27 ENCOUNTER — Ambulatory Visit (HOSPITAL_COMMUNITY)
Admission: RE | Admit: 2019-04-27 | Discharge: 2019-04-27 | Disposition: A | Discharge status: Home / Self Care | Payer: 59 | Attending: Cardiology | Admitting: Cardiology

## 2019-04-27 ENCOUNTER — Other Ambulatory Visit: Payer: Self-pay

## 2019-04-27 ENCOUNTER — Encounter (HOSPITAL_COMMUNITY)
Admission: RE | Disposition: A | Discharge status: Home / Self Care | Payer: Self-pay | Source: Home / Self Care | Attending: Cardiology

## 2019-04-27 ENCOUNTER — Encounter (HOSPITAL_COMMUNITY): Payer: Self-pay | Admitting: *Deleted

## 2019-04-27 ENCOUNTER — Encounter (HOSPITAL_COMMUNITY): Payer: Self-pay | Admitting: Certified Registered"

## 2019-04-27 ENCOUNTER — Ambulatory Visit (HOSPITAL_COMMUNITY): Payer: 59 | Admitting: Certified Registered"

## 2019-04-27 DIAGNOSIS — E559 Vitamin D deficiency, unspecified: Secondary | ICD-10-CM | POA: Insufficient documentation

## 2019-04-27 DIAGNOSIS — E785 Hyperlipidemia, unspecified: Secondary | ICD-10-CM | POA: Insufficient documentation

## 2019-04-27 DIAGNOSIS — I2510 Atherosclerotic heart disease of native coronary artery without angina pectoris: Secondary | ICD-10-CM | POA: Insufficient documentation

## 2019-04-27 DIAGNOSIS — Z9861 Coronary angioplasty status: Secondary | ICD-10-CM | POA: Insufficient documentation

## 2019-04-27 DIAGNOSIS — I129 Hypertensive chronic kidney disease with stage 1 through stage 4 chronic kidney disease, or unspecified chronic kidney disease: Secondary | ICD-10-CM | POA: Insufficient documentation

## 2019-04-27 DIAGNOSIS — I422 Other hypertrophic cardiomyopathy: Secondary | ICD-10-CM | POA: Diagnosis not present

## 2019-04-27 DIAGNOSIS — Z940 Kidney transplant status: Secondary | ICD-10-CM | POA: Diagnosis not present

## 2019-04-27 DIAGNOSIS — Z87891 Personal history of nicotine dependence: Secondary | ICD-10-CM | POA: Insufficient documentation

## 2019-04-27 DIAGNOSIS — I484 Atypical atrial flutter: Secondary | ICD-10-CM | POA: Diagnosis not present

## 2019-04-27 DIAGNOSIS — E213 Hyperparathyroidism, unspecified: Secondary | ICD-10-CM | POA: Diagnosis not present

## 2019-04-27 DIAGNOSIS — M109 Gout, unspecified: Secondary | ICD-10-CM | POA: Diagnosis not present

## 2019-04-27 DIAGNOSIS — I4819 Other persistent atrial fibrillation: Secondary | ICD-10-CM | POA: Insufficient documentation

## 2019-04-27 DIAGNOSIS — N189 Chronic kidney disease, unspecified: Secondary | ICD-10-CM | POA: Diagnosis not present

## 2019-04-27 DIAGNOSIS — Z951 Presence of aortocoronary bypass graft: Secondary | ICD-10-CM | POA: Diagnosis not present

## 2019-04-27 DIAGNOSIS — R739 Hyperglycemia, unspecified: Secondary | ICD-10-CM | POA: Diagnosis not present

## 2019-04-27 DIAGNOSIS — Z8673 Personal history of transient ischemic attack (TIA), and cerebral infarction without residual deficits: Secondary | ICD-10-CM | POA: Diagnosis not present

## 2019-04-27 DIAGNOSIS — I10 Essential (primary) hypertension: Secondary | ICD-10-CM | POA: Diagnosis not present

## 2019-04-27 DIAGNOSIS — I739 Peripheral vascular disease, unspecified: Secondary | ICD-10-CM | POA: Insufficient documentation

## 2019-04-27 DIAGNOSIS — G43909 Migraine, unspecified, not intractable, without status migrainosus: Secondary | ICD-10-CM | POA: Diagnosis not present

## 2019-04-27 DIAGNOSIS — I4891 Unspecified atrial fibrillation: Secondary | ICD-10-CM | POA: Diagnosis not present

## 2019-04-27 HISTORY — PX: CARDIOVERSION: SHX1299

## 2019-04-27 SURGERY — CARDIOVERSION
Anesthesia: General

## 2019-04-27 MED ORDER — SODIUM CHLORIDE 0.9 % IV SOLN
INTRAVENOUS | Status: DC
  Administered 2019-04-27: 09:00:00 via INTRAVENOUS

## 2019-04-27 MED ORDER — SODIUM CHLORIDE 0.9 % IV SOLN
INTRAVENOUS | Status: DC | PRN
  Administered 2019-04-27: 09:00:00 via INTRAVENOUS

## 2019-04-27 MED ORDER — LIDOCAINE 2% (20 MG/ML) 5 ML SYRINGE
INTRAMUSCULAR | Status: DC | PRN
  Administered 2019-04-27: 60 mg via INTRAVENOUS

## 2019-04-27 MED ORDER — PROPOFOL 10 MG/ML IV BOLUS
INTRAVENOUS | Status: DC | PRN
  Administered 2019-04-27: 100 mg via INTRAVENOUS

## 2019-04-27 NOTE — Anesthesia Procedure Notes (Signed)
Procedure Name: General with mask airway Date/Time: 04/27/2019 9:26 AM Performed by: Orlie Dakin, CRNA Pre-anesthesia Checklist: Patient identified, Emergency Drugs available, Suction available, Patient being monitored and Timeout performed Patient Re-evaluated:Patient Re-evaluated prior to induction Oxygen Delivery Method: Ambu bag Preoxygenation: Pre-oxygenation with 100% oxygen Induction Type: IV induction

## 2019-04-27 NOTE — Discharge Instructions (Signed)

## 2019-04-27 NOTE — Interval H&P Note (Signed)
History and Physical Interval Note:  04/27/2019 9:17 AM  [name]  has presented today for surgery, with the diagnosis of AFIB.  The various methods of treatment have been discussed with the patient and family. After consideration of risks, benefits and other options for treatment, the patient has consented to  Procedure(s): CARDIOVERSION (N/A) as a surgical intervention.  The patient's history has been reviewed, patient examined, no change in status, stable for surgery.  I have reviewed the patient's chart and labs.  Questions were answered to the patient's satisfaction.     [name]

## 2019-04-27 NOTE — Transfer of Care (Signed)
Immediate Anesthesia Transfer of Care Note  Patient: [name]  Procedure(s) Performed: CARDIOVERSION (N/A )  Patient Location: Endoscopy Unit  Anesthesia Type:General  Level of Consciousness: drowsy  Airway & Oxygen Therapy: Patient Spontanous Breathing and Patient connected to face mask oxygen  Post-op Assessment: Report given to RN and Post -op Vital signs reviewed and stable  Post vital signs: Reviewed and stable  Last Vitals:  Vitals Value Taken Time  BP    Temp    Pulse    Resp    SpO2      Last Pain:  Vitals:   04/27/19 0851  TempSrc: Oral  PainSc: 0-No pain         Complications: No apparent anesthesia complications

## 2019-04-27 NOTE — CV Procedure (Signed)
    Cardioversion Note  [name] [id number] [birth year]  Procedure: DC Cardioversion Indications: atrial fibrillation  Procedure Details Consent: Obtained Time Out: Verified patient identification, verified procedure, site/side was marked, verified correct patient position, special equipment/implants available, Radiology Safety Procedures followed,  medications/allergies/relevent history reviewed, required imaging and test results available.  Performed  The patient has been on adequate anticoagulation.  The patient received IV propofol administered by anesthesia staff for sedation.  Synchronous cardioversion was performed at 120 joules.  The cardioversion was successful.   Complications: No apparent complications Patient did tolerate procedure well.   Steven Dawley, MD, [facility] 04/27/2019, 9:34 AM

## 2019-04-27 NOTE — Anesthesia Preprocedure Evaluation (Signed)
Anesthesia Evaluation  Patient identified by MRN, date of birth, ID band Patient awake    Reviewed: Allergy & Precautions, H&P , NPO status , Patient's Chart, lab work & pertinent test results  Airway Mallampati: II   Neck ROM: full    Dental   Pulmonary former smoker,    breath sounds clear to auscultation       Cardiovascular hypertension, + CAD, + CABG and + Peripheral Vascular Disease  + dysrhythmias Atrial Fibrillation  Rhythm:irregular Rate:Normal     Neuro/Psych  Headaches, CVA    GI/Hepatic   Endo/Other    Renal/GU Renal diseaseS/p renal transplant     Musculoskeletal   Abdominal   Peds  Hematology   Anesthesia Other Findings   Reproductive/Obstetrics                             Anesthesia Physical Anesthesia Plan  ASA: III  Anesthesia Plan: General   Post-op Pain Management:    Induction: Intravenous  PONV Risk Score and Plan: 2 and Treatment may vary due to age or medical condition and Propofol infusion  Airway Management Planned: Mask  Additional Equipment:   Intra-op Plan:   Post-operative Plan:   Informed Consent: I have reviewed the patients History and Physical, chart, labs and discussed the procedure including the risks, benefits and alternatives for the proposed anesthesia with the patient or authorized representative who has indicated his/her understanding and acceptance.       Plan Discussed with: Anesthesiologist and CRNA  Anesthesia Plan Comments:         Anesthesia Quick Evaluation

## 2019-04-28 ENCOUNTER — Encounter (HOSPITAL_COMMUNITY): Payer: Self-pay | Admitting: Cardiology

## 2019-04-28 DIAGNOSIS — R739 Hyperglycemia, unspecified: Secondary | ICD-10-CM | POA: Diagnosis not present

## 2019-04-28 DIAGNOSIS — I129 Hypertensive chronic kidney disease with stage 1 through stage 4 chronic kidney disease, or unspecified chronic kidney disease: Secondary | ICD-10-CM | POA: Diagnosis not present

## 2019-04-28 DIAGNOSIS — Z940 Kidney transplant status: Secondary | ICD-10-CM | POA: Diagnosis not present

## 2019-04-28 DIAGNOSIS — M109 Gout, unspecified: Secondary | ICD-10-CM | POA: Diagnosis not present

## 2019-04-28 NOTE — Anesthesia Postprocedure Evaluation (Signed)
Anesthesia Post Note  Patient: [name]  Procedure(s) Performed: CARDIOVERSION (N/A )     Patient location during evaluation: Endoscopy Anesthesia Type: General Level of consciousness: awake and alert Pain management: pain level controlled Vital Signs Assessment: post-procedure vital signs reviewed and stable Respiratory status: spontaneous breathing, nonlabored ventilation, respiratory function stable and patient connected to nasal cannula oxygen Cardiovascular status: blood pressure returned to baseline and stable Postop Assessment: no apparent nausea or vomiting Anesthetic complications: no    Last Vitals:  Vitals:   04/27/19 0947 04/27/19 0957  BP: 106/76 106/74  Pulse: (!) 57 (!) 57  Resp: 16 15  Temp:    SpO2: 96% 100%    Last Pain:  Vitals:   04/27/19 0935  TempSrc: Oral  PainSc: 0-No pain                 Kaleem Sartwell S

## 2019-04-29 MED FILL — MYCOPHENOLIC ACID DR 180 MG: 180 | 30 days supply | Qty: 240 | Fill #3

## 2019-04-29 MED FILL — cloNIDine HCL 0.2 MG TABS: 0.2 | 30 days supply | Qty: 30 | Fill #1

## 2019-04-29 MED FILL — ALLOPURINOL 100 MG TABS: 100 | 30 days supply | Qty: 60 | Fill #1

## 2019-04-29 MED FILL — METOPROLOL TARTRATE 25 MG T: 25 | 30 days supply | Qty: 30 | Fill #2

## 2019-04-29 MED FILL — predniSONE 5 MG TABS: 5 | 30 days supply | Qty: 30 | Fill #2

## 2019-04-29 MED FILL — OMEPRAZOLE 20 MG CPDR: 20 | 30 days supply | Qty: 30 | Fill #2

## 2019-05-04 DIAGNOSIS — F4320 Adjustment disorder, unspecified: Secondary | ICD-10-CM | POA: Diagnosis not present

## 2019-05-04 DIAGNOSIS — Z940 Kidney transplant status: Secondary | ICD-10-CM | POA: Diagnosis not present

## 2019-05-04 DIAGNOSIS — I2510 Atherosclerotic heart disease of native coronary artery without angina pectoris: Secondary | ICD-10-CM | POA: Diagnosis not present

## 2019-05-04 DIAGNOSIS — D751 Secondary polycythemia: Secondary | ICD-10-CM | POA: Diagnosis not present

## 2019-05-04 DIAGNOSIS — E785 Hyperlipidemia, unspecified: Secondary | ICD-10-CM | POA: Diagnosis not present

## 2019-05-04 DIAGNOSIS — I4891 Unspecified atrial fibrillation: Secondary | ICD-10-CM | POA: Diagnosis not present

## 2019-05-04 DIAGNOSIS — I129 Hypertensive chronic kidney disease with stage 1 through stage 4 chronic kidney disease, or unspecified chronic kidney disease: Secondary | ICD-10-CM | POA: Diagnosis not present

## 2019-05-04 DIAGNOSIS — I4892 Unspecified atrial flutter: Secondary | ICD-10-CM | POA: Diagnosis not present

## 2019-05-04 DIAGNOSIS — E559 Vitamin D deficiency, unspecified: Secondary | ICD-10-CM | POA: Diagnosis not present

## 2019-05-05 ENCOUNTER — Telehealth (HOSPITAL_COMMUNITY): Payer: 59 | Admitting: Physician Assistant

## 2019-05-06 ENCOUNTER — Ambulatory Visit (HOSPITAL_COMMUNITY)
Admission: RE | Admit: 2019-05-06 | Discharge: 2019-05-06 | Disposition: A | Discharge status: Home / Self Care | Payer: 59 | Source: Ambulatory Visit | Attending: Physician Assistant | Admitting: Physician Assistant

## 2019-05-06 ENCOUNTER — Other Ambulatory Visit: Payer: Self-pay

## 2019-05-06 ENCOUNTER — Encounter (HOSPITAL_COMMUNITY): Payer: Self-pay | Admitting: Physician Assistant

## 2019-05-06 VITALS — BP 136/78 | HR 57 | Ht 68.0 in | Wt 200.0 lb

## 2019-05-06 DIAGNOSIS — I129 Hypertensive chronic kidney disease with stage 1 through stage 4 chronic kidney disease, or unspecified chronic kidney disease: Secondary | ICD-10-CM | POA: Diagnosis not present

## 2019-05-06 DIAGNOSIS — Z87891 Personal history of nicotine dependence: Secondary | ICD-10-CM | POA: Diagnosis not present

## 2019-05-06 DIAGNOSIS — Z8673 Personal history of transient ischemic attack (TIA), and cerebral infarction without residual deficits: Secondary | ICD-10-CM | POA: Diagnosis not present

## 2019-05-06 DIAGNOSIS — I422 Other hypertrophic cardiomyopathy: Secondary | ICD-10-CM | POA: Diagnosis not present

## 2019-05-06 DIAGNOSIS — E669 Obesity, unspecified: Secondary | ICD-10-CM | POA: Diagnosis not present

## 2019-05-06 DIAGNOSIS — M109 Gout, unspecified: Secondary | ICD-10-CM | POA: Diagnosis not present

## 2019-05-06 DIAGNOSIS — I4892 Unspecified atrial flutter: Secondary | ICD-10-CM | POA: Diagnosis not present

## 2019-05-06 DIAGNOSIS — I4819 Other persistent atrial fibrillation: Secondary | ICD-10-CM

## 2019-05-06 DIAGNOSIS — N189 Chronic kidney disease, unspecified: Secondary | ICD-10-CM | POA: Insufficient documentation

## 2019-05-06 DIAGNOSIS — Z79899 Other long term (current) drug therapy: Secondary | ICD-10-CM | POA: Insufficient documentation

## 2019-05-06 DIAGNOSIS — I739 Peripheral vascular disease, unspecified: Secondary | ICD-10-CM | POA: Insufficient documentation

## 2019-05-06 DIAGNOSIS — I2510 Atherosclerotic heart disease of native coronary artery without angina pectoris: Secondary | ICD-10-CM | POA: Diagnosis not present

## 2019-05-06 DIAGNOSIS — E785 Hyperlipidemia, unspecified: Secondary | ICD-10-CM | POA: Insufficient documentation

## 2019-05-06 DIAGNOSIS — Z7901 Long term (current) use of anticoagulants: Secondary | ICD-10-CM | POA: Insufficient documentation

## 2019-05-06 DIAGNOSIS — I4891 Unspecified atrial fibrillation: Secondary | ICD-10-CM | POA: Insufficient documentation

## 2019-05-06 DIAGNOSIS — Z940 Kidney transplant status: Secondary | ICD-10-CM | POA: Insufficient documentation

## 2019-05-06 DIAGNOSIS — E039 Hypothyroidism, unspecified: Secondary | ICD-10-CM | POA: Diagnosis not present

## 2019-05-06 DIAGNOSIS — Z6831 Body mass index (BMI) 31.0-31.9, adult: Secondary | ICD-10-CM | POA: Insufficient documentation

## 2019-05-06 DIAGNOSIS — Z951 Presence of aortocoronary bypass graft: Secondary | ICD-10-CM | POA: Diagnosis not present

## 2019-05-06 NOTE — Progress Notes (Signed)
Primary Care Physician: Patient, No Pcp Per Referring Physician: Dr. Rayann Heman EP: Dr. Raylene Miyamoto [name] is a 58 y.o. male with a h/o  afib ablation 9/20/ 2018 and aflutter ablation in 2013.Marland Kitchen After last ablation, pt had symptomatic arrhythmias. He was seen by Dr. Rayann Heman and started on amiodarone . This and cardioversion restored SR.    Patient is now s/p DCCV on 04/27/19. He reports that he has immediate symptom relief after his cardioversion. He has done well with no symptoms of palpitations or SOB. He has not missed any doses of anticoagulation.  Today, he denies symptoms of palpitations, chest pain, shortness of breath, orthopnea, PND, lower extremity edema, dizziness, presyncope, syncope, or neurologic sequela. The patient is tolerating medications without difficulties and is otherwise without complaint today.   Past Medical History:  Diagnosis Date  . Arteriovenous fistula, acquired ([place])   . Atrial fibrillation and flutter ([place])   . Bladder stones   . BMI 31.0-31.9,adult   . CAD (coronary artery disease)    a. s/p CABG 03/2012: LIMA to LAD, free RIMA to OM1, SVG to D1, sequential SVG to AM and LPLB2, EVH via right thigh and leg.  . Cellulitis 04/13/2012   RLE saphenous vein harvest incision   . CKD (chronic kidney disease)   . Dyslipidemia   . Gout   . History of kidney transplant   . Hyperglycemia   . Hyperlipidemia   . Hyperparathyroidism   . Hypertension   . Hypertrophic cardiomyopathy ([place])   . Hypomagnesemia   . Immunosuppression ([place])   . Intermittent palpitations   . Microscopic hematuria   . Migraines   . PAF and Flutter    a. Afib post-op CABG; AFlutter 10/2012  . Peripheral vascular disease ([place])   . Post-transplant erythrocytosis   . Renal disease   . S/P living-donor kidney transplantation   . Sinus node dysfunction-post termination pause    a. >8sec in setting of aflutter 10/2012  . Sleep pattern disturbance   . Stroke [facility]) 1995; 2001   denies  residual  . Superficial vein thrombosis    a. Steven greater saphenous 04/2012  . Vitamin D deficiency   . VT (ventricular tachycardia) ([place])    Past Surgical History:  Procedure Laterality Date  . ATRIAL FIBRILLATION ABLATION N/A 08/21/2017   Procedure: Atrial Fibrillation Ablation;  Surgeon: [name], MD;  Location: [place] CV LAB;  Service: Cardiovascular;  Laterality: N/A;  . ATRIAL FLUTTER ABLATION N/A 11/09/2012   Procedure: ATRIAL FLUTTER ABLATION;  Surgeon: [name], MD;  Location: [facility] CATH LAB;  Service: Cardiovascular;  Laterality: N/A;  . AV FISTULA PLACEMENT  2011   left forearm  . AV FISTULA PLACEMENT    . CARDIAC CATHETERIZATION  03/16/12  . CARDIOVERSION N/A 06/10/2017   Procedure: CARDIOVERSION;  Surgeon: Pixie Casino, MD;  Location: [facility] ENDOSCOPY;  Service: Cardiovascular;  Laterality: N/A;  . CARDIOVERSION N/A 10/13/2017   Procedure: CARDIOVERSION;  Surgeon: Dorothy Spark, MD;  Location: [facility] ENDOSCOPY;  Service: Cardiovascular;  Laterality: N/A;  . CARDIOVERSION N/A 10/08/2018   Procedure: CARDIOVERSION;  Surgeon: Larey Dresser, MD;  Location: [facility] ENDOSCOPY;  Service: Cardiovascular;  Laterality: N/A;  . CARDIOVERSION N/A 04/27/2019   Procedure: CARDIOVERSION;  Surgeon: Dorothy Spark, MD;  Location: [place];  Service: Cardiovascular;  Laterality: N/A;  . CORONARY ARTERY BYPASS GRAFT  03/19/2012   Procedure: CORONARY ARTERY BYPASS GRAFTING (CABG);  Surgeon: Rexene Alberts, MD;  Location: [facility]  OR;  Service: Open Heart Surgery;  Laterality: N/A;  (B) MAMMARY  . CYSTOSCOPY WITH BIOPSY N/A 08/15/2016   Procedure: CYSTOSCOPY WITH IDENTIFICATION OF RIGHT TRANSPLANTATION OF URETRAL ORFICE WITH FLUORESCEIN;  Surgeon: Carolan Clines, MD;  Location: [facility] ORS;  Service: Urology;  Laterality: N/A;  . DENTAL SURGERY  2008   multiple  . [place]   left  . FRACTURE SURGERY    . HERNIA REPAIR  40/9811   umbilical  . HOLMIUM LASER APPLICATION  N/A 08/15/7828   Procedure: HOLMIUM LASER APPLICATION WITH 3 CM RIGHT BLADDER STONE;  Surgeon: Carolan Clines, MD;  Location: [facility] ORS;  Service: Urology;  Laterality: N/A;  . INGUINAL HERNIA REPAIR  09/2009   bilaterally  . KIDNEY TRANSPLANT    . LEFT HEART CATHETERIZATION WITH CORONARY ANGIOGRAM N/A 03/16/2012   Procedure: LEFT HEART CATHETERIZATION WITH CORONARY ANGIOGRAM;  Surgeon: Sherren Mocha, MD;  Location: [facility] CATH LAB;  Service: Cardiovascular;  Laterality: N/A;  . RENAL ARTERY STENT  2001  . TEE WITHOUT CARDIOVERSION N/A 08/21/2017   Procedure: TRANSESOPHAGEAL ECHOCARDIOGRAM (TEE);  Surgeon: Larey Dresser, MD;  Location: [facility] ENDOSCOPY;  Service: Cardiovascular;  Laterality: N/A;  . TRANSURETHRAL RESECTION OF BLADDER TUMOR N/A 08/15/2016   Procedure: TRANSURETHRAL BIOPSY OF RIGHT BLADDER WALL;  Surgeon: Carolan Clines, MD;  [facility]: [facility] ORS;  Service: Urology;  Laterality: N/A;    Current Outpatient Medications  Medication Sig Dispense Refill  . allopurinol (ZYLOPRIM) 100 MG tablet Take 200 mg by mouth daily.     Marland Kitchen amiodarone (PACERONE) 200 MG tablet Take 200 mg by mouth 2 (two) times daily.     . cloNIDine (CATAPRES) 0.2 MG tablet Take 0.2 mg by mouth at bedtime.     Marland Kitchen diltiazem (CARDIZEM) 60 MG tablet Take 1 tablet as needed every 6 hours for rapid afib HR over 100. 45 tablet 1  . ELIQUIS 5 MG TABS tablet TAKE 1 TABLET BY MOUTH 2 TIMES DAILY. (Patient taking differently: Take 5 mg by mouth 2 (two) times daily. ) 60 tablet 9  . fenofibrate micronized (LOFIBRA) 134 MG capsule Take 134 mg by mouth daily.     Vanessa Kick Ethyl (VASCEPA) 1 g CAPS Take 2 capsules (2 g total) by mouth 2 (two) times daily. 120 capsule 11  . magnesium oxide (MAG-OX) 400 (241.3 Mg) MG tablet Take 400 mg by mouth 2 (two) times daily.    . metoprolol tartrate (LOPRESSOR) 25 MG tablet Take 25 mg by mouth 2 (two) times a day.    . mycophenolate (MYFORTIC) 180 MG EC tablet Take 720 mg by mouth 2 (two) times  daily.     Marland Kitchen omeprazole (PRILOSEC) 20 MG capsule Take 20 mg by mouth daily.  5  . predniSONE (DELTASONE) 5 MG tablet Take 5 mg by mouth at bedtime.    . rosuvastatin (CRESTOR) 20 MG tablet Take 1 tablet (20 mg total) by mouth daily. (Patient taking differently: Take 20 mg by mouth every evening. ) 90 tablet 3  . sulfamethoxazole-trimethoprim (BACTRIM,SEPTRA) 400-80 MG tablet Take 1 tablet by mouth every Monday, Wednesday, and Friday.  6  . tacrolimus (PROGRAF) 0.5 MG capsule Take 0.5 mg by mouth 2 (two) times daily.   2  . tadalafil (CIALIS) 5 MG tablet Take 5 mg by mouth at bedtime.   11  . Vitamin D, Ergocalciferol, (DRISDOL) 50000 units CAPS capsule Take 50,000 Units by mouth 2 (two) times a week. Tuesdays and Saturdays  5  No current facility-administered medications for this visit.     Allergies  Allergen Reactions  . Contrast Media [Iodinated Diagnostic Agents] Other (See Comments)    Pt states that this medication causes him to code.   Marland Kitchen Penicillins Itching, Rash and Other (See Comments)    Has patient had a PCN reaction causing immediate rash, facial/tongue/throat swelling, SOB or lightheadedness with hypotension: No Has patient had a PCN reaction causing severe rash involving mucus membranes or skin necrosis: No Has patient had a PCN reaction that required hospitalization No Has patient had a PCN reaction occurring within the last 10 years: No If all of the above answers are "NO", then may proceed with Cephalosporin use.  Marland Kitchen Lisinopril     Increased Cr    Social History   Socioeconomic History  . Marital status: Married    Spouse name: Not on file  . Number of children: Not on file  . Years of education: Not on file  . Highest education level: Not on file  Occupational History  . Not on file  Social Needs  . Financial resource strain: Not on file  . Food insecurity:    Worry: Not on file    Inability: Not on file  . Transportation needs:    Medical: Not on file     Non-medical: Not on file  Tobacco Use  . Smoking status: Former Smoker    Packs/day: 0.30    Years: 30.00    Pack years: 9.00    Types: Cigarettes    Last attempt to quit: 03/15/2012    Years since quitting: 7.1  . Smokeless tobacco: Never Used  Substance and Sexual Activity  . Alcohol use: Yes    Comment: 04/13/12 "2 mixed drinks/month"  . Drug use: No  . Sexual activity: Yes  Lifestyle  . Physical activity:    Days per week: Not on file    Minutes per session: Not on file  . Stress: Not on file  Relationships  . Social connections:    Talks on phone: Not on file    Gets together: Not on file    Attends religious service: Not on file    Active member of club or organization: Not on file    Attends meetings of clubs or organizations: Not on file    Relationship status: Not on file  . Intimate partner violence:    Fear of current or ex partner: Not on file    Emotionally abused: Not on file    Physically abused: Not on file    Forced sexual activity: Not on file  Other Topics Concern  . Not on file  Social History Narrative  . Not on file    Family History  Adopted: Yes  Problem Relation Age of Onset  . Hypertension Other     ROS- All systems are reviewed and negative except as per the HPI above  Physical Exam: There were no vitals filed for this visit. Wt Readings from Last 3 Encounters:  04/27/19 200 lb (90.7 kg)  03/30/19 190 lb (86.2 kg)  03/11/19 193 lb 12.8 oz (87.9 kg)    Labs: Lab Results  Component Value Date   NA 140 04/22/2019   K 4.7 04/22/2019   CL 107 04/22/2019   CO2 26 04/22/2019   GLUCOSE 116 (H) 04/22/2019   BUN 19 04/22/2019   CREATININE 1.26 (H) 04/22/2019   CALCIUM 9.7 04/22/2019   PHOS 6.9 (H) 04/16/2012   MG 1.7 06/14/2017  Lab Results  Component Value Date   INR 1.07 03/01/2017   No results found for: CHOL, HDL, LDLCALC, TRIG  GEN- The patient is well appearing obese male, alert and oriented x 3 today.   HEENT-head  normocephalic, atraumatic, sclera clear, conjunctiva pink, hearing intact, trachea midline. Lungs- Clear to ausculation bilaterally, normal work of breathing Heart- Regular rate and rhythm, no murmurs, rubs or gallops  GI- soft, NT, ND, + BS Extremities- no clubbing, cyanosis, trace edema MS- no significant deformity or atrophy Skin- no rash or lesion Psych- euthymic mood, full affect Neuro- strength and sensation are intact   EKG- SR HR 57, LAFB, LVH, PR 174, QRS 124, QTc 490.  Epic records reviewed   Assessment and Plan: 1. Atrial fibrillation/atypical flutter  S/p ablation 08/2017 with Dr Rayann Heman. S/p DCCV 04/27/19. Appears to be maintaining SR. Per Dr Jackalyn Lombard procedure note "doubtful additional ablation would be beneficial." Continue amiodarone 200 mg daily Continue diltiazem 60 mg PRN q 6 hrs. Continue Eliquis 5 mg BID Continue metoprolol 25 mg BID (decreased after DCCV).  This patients CHA2DS2-VASc Score and unadjusted Ischemic Stroke Rate (% per year) is equal to 7.2 % stroke rate/year from a score of 5  Above score calculated as 1 point each if present [CHF, HTN, DM, Vascular=MI/PAD/Aortic Plaque, Age if 65-74, or Male] Above score calculated as 2 points each if present [Age > 75, or Stroke/TIA/TE]  2. CAD s/p CABG No anginal symptoms. Continue present therapy.   3. HCM Stable, no changes today.  4. HTN Stable, no changes today.  5. S/p renal transplant   Follow up with Dr Caryl Comes in 3 months. AF clinic in 6 months.    [facility] [address] [place], [place] [address] [phone]

## 2019-05-13 MED FILL — FENOFIBRATE 134 MG CAPSULE: 134 | 30 days supply | Qty: 30 | Fill #5

## 2019-05-24 DIAGNOSIS — F4320 Adjustment disorder, unspecified: Secondary | ICD-10-CM | POA: Diagnosis not present

## 2019-05-25 MED FILL — SULFAMETHOXAZOLE-TMP SS TAB: 400-80 | 28 days supply | Qty: 12 | Fill #0

## 2019-05-25 MED FILL — TACROLIMUS 0.5 MG CAPSULE: 0.5 | 30 days supply | Qty: 90 | Fill #0

## 2019-05-25 MED FILL — VIT D2 1.25 MG (50,000 UNIT: 1.25 MG | 28 days supply | Qty: 8 | Fill #0

## 2019-05-25 MED FILL — VASCEPA 1 GM CAPSULE: 1 | 30 days supply | Qty: 120 | Fill #0

## 2019-05-25 MED FILL — MYCOPHENOLIC ACID DR 180 MG: 180 | 30 days supply | Qty: 240 | Fill #0

## 2019-05-25 MED FILL — ALLOPURINOL 100 MG TABS: 100 | 30 days supply | Qty: 60 | Fill #0

## 2019-05-25 MED FILL — predniSONE 5 MG TABS: 5 | 30 days supply | Qty: 30 | Fill #0

## 2019-05-25 MED FILL — OMEPRAZOLE 20 MG CPDR: 20 | 30 days supply | Qty: 30 | Fill #0

## 2019-05-25 MED FILL — METOPROLOL TARTRATE 25 MG T: 25 | 30 days supply | Qty: 30 | Fill #0

## 2019-05-25 MED FILL — cloNIDine HCL 0.2 MG TABS: 0.2 | 30 days supply | Qty: 30 | Fill #0

## 2019-06-01 DIAGNOSIS — I129 Hypertensive chronic kidney disease with stage 1 through stage 4 chronic kidney disease, or unspecified chronic kidney disease: Secondary | ICD-10-CM | POA: Diagnosis not present

## 2019-06-01 DIAGNOSIS — Z940 Kidney transplant status: Secondary | ICD-10-CM | POA: Diagnosis not present

## 2019-06-01 DIAGNOSIS — E559 Vitamin D deficiency, unspecified: Secondary | ICD-10-CM | POA: Diagnosis not present

## 2019-06-03 DIAGNOSIS — B351 Tinea unguium: Secondary | ICD-10-CM | POA: Diagnosis not present

## 2019-06-09 DIAGNOSIS — L812 Freckles: Secondary | ICD-10-CM | POA: Diagnosis not present

## 2019-06-09 DIAGNOSIS — D2271 Melanocytic nevi of right lower limb, including hip: Secondary | ICD-10-CM | POA: Diagnosis not present

## 2019-06-09 DIAGNOSIS — D2272 Melanocytic nevi of left lower limb, including hip: Secondary | ICD-10-CM | POA: Diagnosis not present

## 2019-06-09 DIAGNOSIS — D1801 Hemangioma of skin and subcutaneous tissue: Secondary | ICD-10-CM | POA: Diagnosis not present

## 2019-06-09 DIAGNOSIS — D2262 Melanocytic nevi of left upper limb, including shoulder: Secondary | ICD-10-CM | POA: Diagnosis not present

## 2019-06-09 DIAGNOSIS — F4320 Adjustment disorder, unspecified: Secondary | ICD-10-CM | POA: Diagnosis not present

## 2019-06-09 DIAGNOSIS — D225 Melanocytic nevi of trunk: Secondary | ICD-10-CM | POA: Diagnosis not present

## 2019-06-09 DIAGNOSIS — L720 Epidermal cyst: Secondary | ICD-10-CM | POA: Diagnosis not present

## 2019-06-09 DIAGNOSIS — D2261 Melanocytic nevi of right upper limb, including shoulder: Secondary | ICD-10-CM | POA: Diagnosis not present

## 2019-06-10 ENCOUNTER — Other Ambulatory Visit: Payer: Self-pay | Admitting: Internal Medicine

## 2019-06-10 DIAGNOSIS — R079 Chest pain, unspecified: Secondary | ICD-10-CM

## 2019-06-10 MED FILL — ELIQUIS 5 MG TABLET: 5 | 30 days supply | Qty: 60 | Fill #0

## 2019-06-10 MED FILL — FENOFIBRATE 134 MG CAPSULE: 134 | 30 days supply | Qty: 30 | Fill #0

## 2019-06-10 MED FILL — ROSUVASTATIN CALCIUM 20 MG: 20 | 90 days supply | Qty: 90 | Fill #0

## 2019-06-10 MED FILL — TADALAFIL 5 MG TABS: 5 | 30 days supply | Qty: 30 | Fill #0

## 2019-06-10 NOTE — Telephone Encounter (Signed)
Prescription refill request for Eliquis received.  Last office visit: (03-11-2019) Clint Fenton Scr: 1.26 (04-22-2019) Age:  58 y.o. Weight: 87.9 kg ( Per Clint's Note on 03/11/2019)  Prescription refill sent.

## 2019-06-17 DIAGNOSIS — I129 Hypertensive chronic kidney disease with stage 1 through stage 4 chronic kidney disease, or unspecified chronic kidney disease: Secondary | ICD-10-CM | POA: Diagnosis not present

## 2019-06-17 DIAGNOSIS — Z7689 Persons encountering health services in other specified circumstances: Secondary | ICD-10-CM | POA: Diagnosis not present

## 2019-06-23 DIAGNOSIS — F4320 Adjustment disorder, unspecified: Secondary | ICD-10-CM | POA: Diagnosis not present

## 2019-06-24 MED FILL — TACROLIMUS 0.5 MG CAPSULE: 0.5 | 30 days supply | Qty: 90 | Fill #1

## 2019-06-24 MED FILL — OMEPRAZOLE 20 MG CPDR: 20 | 30 days supply | Qty: 30 | Fill #1

## 2019-06-24 MED FILL — ALLOPURINOL 100 MG TABS: 100 | 30 days supply | Qty: 60 | Fill #1

## 2019-06-24 MED FILL — SULFAMETHOXAZOLE-TMP SS TAB: 400-80 | 28 days supply | Qty: 12 | Fill #1

## 2019-06-24 MED FILL — METOPROLOL TARTRATE 25 MG T: 25 | 30 days supply | Qty: 30 | Fill #1

## 2019-06-24 MED FILL — VIT D2 1.25 MG (50,000 UNIT: 1.25 MG | 28 days supply | Qty: 8 | Fill #1

## 2019-06-24 MED FILL — predniSONE 5 MG TABS: 5 | 30 days supply | Qty: 30 | Fill #1

## 2019-06-24 MED FILL — VASCEPA 1 GM CAPSULE: 1 | 30 days supply | Qty: 120 | Fill #1

## 2019-06-24 MED FILL — cloNIDine HCL 0.2 MG TABS: 0.2 | 30 days supply | Qty: 30 | Fill #1

## 2019-06-29 ENCOUNTER — Telehealth: Payer: Self-pay | Admitting: Internal Medicine

## 2019-06-29 NOTE — Telephone Encounter (Signed)

## 2019-06-30 ENCOUNTER — Ambulatory Visit (INDEPENDENT_AMBULATORY_CARE_PROVIDER_SITE_OTHER): Payer: 59 | Admitting: Internal Medicine

## 2019-06-30 ENCOUNTER — Encounter: Payer: Self-pay | Admitting: Internal Medicine

## 2019-06-30 ENCOUNTER — Other Ambulatory Visit: Payer: Self-pay

## 2019-06-30 VITALS — BP 110/80 | HR 54 | Ht 68.0 in | Wt 202.4 lb

## 2019-06-30 DIAGNOSIS — I422 Other hypertrophic cardiomyopathy: Secondary | ICD-10-CM | POA: Diagnosis not present

## 2019-06-30 DIAGNOSIS — I4819 Other persistent atrial fibrillation: Secondary | ICD-10-CM

## 2019-06-30 MED ORDER — DILTIAZEM HCL 60 MG PO TABS: ORAL_TABLET | ORAL | 1 refills | Status: DC

## 2019-06-30 MED FILL — dilTIAZem HCL 60 MG TABS: 60 | 11 days supply | Qty: 45 | Fill #0

## 2019-06-30 NOTE — H&P (View-Only) (Signed)
A series send a text to [name], continue me a favor maybe tomorrow and look at the last echo on [name] date of birth 03-14-1961.  The issue is whether  Patient Care Team: Patient, No Pcp Per as PCP - General (General Practice)   HPI  [name] is a 58 y.o. male Seen in followup for concerns about atrial fibrillation in the context of significant left atrial enlargement prior atrial flutter for which he underwent catheter ablation 2013 and left ventricular hypertrophy.  He had a history of ischemic heart disease with prior bypass surgery 4/13 and had post CABG atrial fibrillation.  He has had recurrent infrequent episodes of prolonged irregular tachycardia palpitations. These have been associated with fatigue and dyspnea and residual weakness. There reminiscent of his rhythm disturbances with atrial fibrillation. AliveCor demonstrated PACs but no AFib--  Has hx of remote stroke   DATE TEST EF   9/15 Echo   55-65 % LVH severe LAE (53/2.4)  7/16 cMRI   ASH Mid--apical  cavitary obliteration  No contrast 2/2 renal disease  9/18 TEE 65% HCM    Date Cr K Hgb  9/17 1.5 4.6    10/19 1.5 4.1 17  5/20 1.26 4.7 16.1       Atypical and typical flutter but amplitudes particularly in lead V1 which are very atypical.  He saw Dr. Greggory Brandy and underwent catheter ablation for atrial flutter as well as atrial fibrillation.  9/18.. Atrial flutter was noninducible.  Pulmonary vein isolation was accomplished.  Multiple PACs.  Further repeat ablation was not recommended    Had 11/18  postprocedural (2 months) atrial flutter requiring cardioversion; 10/19 had recurrent atrial flutter and underwent repeat cardioversion 11/19 and 5/20 ( noted on Alive Cor monitor )   Cardioverted most recently 5/20.  Did not note the normal significant improvement in symptoms.  Has used his AliveCor and has had intermittent issues.  Reviewed them today.  Atrial fibrillation was present on 1, rapid  tachycardia was present on 2 although he says these were relatively brief.  Others were sinus rhythm.  He has resting bradycardia  Past Medical History:  Diagnosis Date  . Arteriovenous fistula, acquired ([place])   . Atrial fibrillation and flutter ([place])   . Bladder stones   . BMI 31.0-31.9,adult   . CAD (coronary artery disease)    a. s/p CABG 03/2012: LIMA to LAD, free RIMA to OM1, SVG to D1, sequential SVG to AM and LPLB2, EVH via right thigh and leg.  . Cellulitis 04/13/2012   RLE saphenous vein harvest incision   . CKD (chronic kidney disease)   . Dyslipidemia   . Gout   . History of kidney transplant   . Hyperglycemia   . Hyperlipidemia   . Hyperparathyroidism   . Hypertension   . Hypertrophic cardiomyopathy ([place])   . Hypomagnesemia   . Immunosuppression ([place])   . Intermittent palpitations   . Microscopic hematuria   . Migraines   . PAF and Flutter    a. Afib post-op CABG; AFlutter 10/2012  . Peripheral vascular disease ([place])   . Post-transplant erythrocytosis   . Renal disease   . S/P living-donor kidney transplantation   . Sinus node dysfunction-post termination pause    a. >8sec in setting of aflutter 10/2012  . Sleep pattern disturbance   . Stroke [facility]) 1995; 2001   denies residual  . Superficial vein thrombosis    a. R greater saphenous 04/2012  .  Vitamin D deficiency   . VT (ventricular tachycardia) ([place])     Past Surgical History:  Procedure Laterality Date  . ATRIAL FIBRILLATION ABLATION N/A 08/21/2017   Procedure: Atrial Fibrillation Ablation;  Surgeon: [name], MD;  Location: [place] CV LAB;  Service: Cardiovascular;  Laterality: N/A;  . ATRIAL FLUTTER ABLATION N/A 11/09/2012   Procedure: ATRIAL FLUTTER ABLATION;  Surgeon: [name], MD;  Location: [facility] CATH LAB;  Service: Cardiovascular;  Laterality: N/A;  . AV FISTULA PLACEMENT  2011   left forearm  . AV FISTULA PLACEMENT    . CARDIAC CATHETERIZATION  03/16/12  . CARDIOVERSION N/A 06/10/2017    Procedure: CARDIOVERSION;  Surgeon: Pixie Casino, MD;  Location: [facility] ENDOSCOPY;  Service: Cardiovascular;  Laterality: N/A;  . CARDIOVERSION N/A 10/13/2017   Procedure: CARDIOVERSION;  Surgeon: Dorothy Spark, MD;  Location: [facility] ENDOSCOPY;  Service: Cardiovascular;  Laterality: N/A;  . CARDIOVERSION N/A 10/08/2018   Procedure: CARDIOVERSION;  Surgeon: Larey Dresser, MD;  Location: [facility] ENDOSCOPY;  Service: Cardiovascular;  Laterality: N/A;  . CARDIOVERSION N/A 04/27/2019   Procedure: CARDIOVERSION;  Surgeon: Dorothy Spark, MD;  Location: [place];  Service: Cardiovascular;  Laterality: N/A;  . CORONARY ARTERY BYPASS GRAFT  03/19/2012   Procedure: CORONARY ARTERY BYPASS GRAFTING (CABG);  Surgeon: Rexene Alberts, MD;  Location: [place];  Service: Open Heart Surgery;  Laterality: N/A;  (B) MAMMARY  . CYSTOSCOPY WITH BIOPSY N/A 08/15/2016   Procedure: CYSTOSCOPY WITH IDENTIFICATION OF RIGHT TRANSPLANTATION OF URETRAL ORFICE WITH FLUORESCEIN;  Surgeon: Carolan Clines, MD;  Location[facility] ORS;  Service: Urology;  Laterality: N/A;  . DENTAL SURGERY  2008   multiple  . [place]   left  . FRACTURE SURGERY    . HERNIA REPAIR  17/4081   umbilical  . HOLMIUM LASER APPLICATION N/A 4/48/1856   Procedure: HOLMIUM LASER APPLICATION WITH 3 CM RIGHT BLADDER STONE;  Surgeon: Carolan Clines, MD;  Location[facility] ORS;  Service: Urology;  Laterality: N/A;  . INGUINAL HERNIA REPAIR  09/2009   bilaterally  . KIDNEY TRANSPLANT    . LEFT HEART CATHETERIZATION WITH CORONARY ANGIOGRAM N/A 03/16/2012   Procedure: LEFT HEART CATHETERIZATION WITH CORONARY ANGIOGRAM;  Surgeon: Sherren Mocha, MD;  Location: [facility] CATH LAB;  Service: Cardiovascular;  Laterality: N/A;  . RENAL ARTERY STENT  2001  . TEE WITHOUT CARDIOVERSION N/A 08/21/2017   Procedure: TRANSESOPHAGEAL ECHOCARDIOGRAM (TEE);  Surgeon: Larey Dresser, MD;  Location: [facility] ENDOSCOPY;  Service: Cardiovascular;  Laterality: N/A;  .  TRANSURETHRAL RESECTION OF BLADDER TUMOR N/A 08/15/2016   Procedure: TRANSURETHRAL BIOPSY OF RIGHT BLADDER WALL;  Surgeon: Carolan Clines, MD;  [facility] ORS;  Service: Urology;  Laterality: N/A;    Current Outpatient Medications  Medication Sig Dispense Refill  . allopurinol (ZYLOPRIM) 100 MG tablet Take 200 mg by mouth daily.     Marland Kitchen amiodarone (PACERONE) 200 MG tablet Take 200 mg by mouth daily.    . cloNIDine (CATAPRES) 0.2 MG tablet Take 0.2 mg by mouth at bedtime.     Marland Kitchen diltiazem (CARDIZEM) 60 MG tablet Take 1 tablet as needed every 6 hours for rapid afib HR over 100. 45 tablet 1  . ELIQUIS 5 MG TABS tablet TAKE 1 TABLET BY MOUTH TWICE A DAY 60 tablet 5  . fenofibrate micronized (LOFIBRA) 134 MG capsule Take 134 mg by mouth daily.     Vanessa Kick Ethyl (VASCEPA) 1 g CAPS Take 2 capsules (2 g total)  by mouth 2 (two) times daily. 120 capsule 11  . magnesium oxide (MAG-OX) 400 (241.3 Mg) MG tablet Take 400 mg by mouth 2 (two) times daily.    . metoprolol tartrate (LOPRESSOR) 25 MG tablet Take 25 mg by mouth daily.     . mycophenolate (MYFORTIC) 180 MG EC tablet Take 720 mg by mouth 2 (two) times daily.     Marland Kitchen omeprazole (PRILOSEC) 20 MG capsule Take 20 mg by mouth daily.  5  . predniSONE (DELTASONE) 5 MG tablet Take 5 mg by mouth at bedtime.    . rosuvastatin (CRESTOR) 20 MG tablet Take 1 tablet (20 mg total) by mouth daily. 90 tablet 3  . sulfamethoxazole-trimethoprim (BACTRIM,SEPTRA) 400-80 MG tablet Take 1 tablet by mouth every Monday, Wednesday, and Friday.  6  . tacrolimus (PROGRAF) 0.5 MG capsule Take 0.5 mg by mouth 2 (two) times daily.   2  . tadalafil (CIALIS) 5 MG tablet Take 5 mg by mouth at bedtime.   11  . Vitamin D, Ergocalciferol, (DRISDOL) 50000 units CAPS capsule Take 50,000 Units by mouth 2 (two) times a week. Tuesdays and Saturdays  5   No current facility-administered medications for this visit.     Allergies  Allergen Reactions  . Contrast Media [Iodinated  Diagnostic Agents] Other (See Comments)    Pt states that this medication causes him to code.   Marland Kitchen Penicillins Itching, Rash and Other (See Comments)    Has patient had a PCN reaction causing immediate rash, facial/tongue/throat swelling, SOB or lightheadedness with hypotension: No Has patient had a PCN reaction causing severe rash involving mucus membranes or skin necrosis: No Has patient had a PCN reaction that required hospitalization No Has patient had a PCN reaction occurring within the last 10 years: No If all of the above answers are "NO", then may proceed with Cephalosporin use.  Marland Kitchen Lisinopril     Increased Cr      Review of Systems negative except from HPI and PMH  BP 110/80   Pulse (!) 54   Ht 5\' 8"  (1.727 m)   Wt 202 lb 6.4 oz (91.8 kg)   SpO2 98%   BMI 30.77 kg/m  Well developed and nourished in no acute distress HENT normal Neck supple with JVP-  flat Clear Regular rate and rhythm, 2/6 murmurs or gallops Abd-soft with active BS No Clubbing cyanosis edema Skin-warm and dry A & Oriented  Grossly normal sensory and motor function  ECG sinus rhythm at 54 [place] 17/13/51 Axis left -31 T waves inversion V3-V6 and 1-L     Assessment and  Plan   Atrial Flutter  S/p RFCA-- recurrent and atypical s/p ablation 2018-JA  Freq PACs  LAE  LVH-Asymmetric 18/11  HCM    CAD s/p CABG 2013  Sleep disordered breathing   Sinus bradycardia  Status post kidney transplant   He continues to have episodic atrial arrhythmia.

## 2019-06-30 NOTE — Progress Notes (Signed)
A series send a text to [facility], continue me a favor maybe tomorrow and look at the last echo on [name] date of birth 10-22-1961.  The issue is whether  Patient Care Team: Patient, No Pcp Per as PCP - General (General Practice)   HPI  [name] is a 58 y.o. male Seen in followup for concerns about atrial fibrillation in the context of significant left atrial enlargement prior atrial flutter for which he underwent catheter ablation 2013 and left ventricular hypertrophy.  He had a history of ischemic heart disease with prior bypass surgery 4/13 and had post CABG atrial fibrillation.  He has had recurrent infrequent episodes of prolonged irregular tachycardia palpitations. These have been associated with fatigue and dyspnea and residual weakness. There reminiscent of his rhythm disturbances with atrial fibrillation. AliveCor demonstrated PACs but no AFib--  Has hx of remote stroke   DATE TEST EF   9/15 Echo   55-65 % LVH severe LAE (53/2.4)  7/16 cMRI   ASH Mid--apical  cavitary obliteration  No contrast 2/2 renal disease  9/18 TEE 65% HCM    Date Cr K Hgb  9/17 1.5 4.6    10/19 1.5 4.1 17  5/20 1.26 4.7 16.1       Atypical and typical flutter but amplitudes particularly in lead V1 which are very atypical.  He saw Dr. Greggory Brandy and underwent catheter ablation for atrial flutter as well as atrial fibrillation.  9/18.. Atrial flutter was noninducible.  Pulmonary vein isolation was accomplished.  Multiple PACs.  Further repeat ablation was not recommended    Had 11/18  postprocedural (2 months) atrial flutter requiring cardioversion; 10/19 had recurrent atrial flutter and underwent repeat cardioversion 11/19 and 5/20 ( noted on Alive Cor monitor )   Cardioverted most recently 5/20.  Did not note the normal significant improvement in symptoms.  Has used his AliveCor and has had intermittent issues.  Reviewed them today.  Atrial fibrillation was present on 1, rapid  tachycardia was present on 2 although he says these were relatively brief.  Others were sinus rhythm.  He has resting bradycardia  Past Medical History:  Diagnosis Date  . Arteriovenous fistula, acquired ([place])   . Atrial fibrillation and flutter ([place])   . Bladder stones   . BMI 31.0-31.9,adult   . CAD (coronary artery disease)    a. s/p CABG 03/2012: LIMA to LAD, free RIMA to OM1, SVG to D1, sequential SVG to AM and LPLB2, EVH via right thigh and leg.  . Cellulitis 04/13/2012   RLE saphenous vein harvest incision   . CKD (chronic kidney disease)   . Dyslipidemia   . Gout   . History of kidney transplant   . Hyperglycemia   . Hyperlipidemia   . Hyperparathyroidism   . Hypertension   . Hypertrophic cardiomyopathy ([place])   . Hypomagnesemia   . Immunosuppression ([place])   . Intermittent palpitations   . Microscopic hematuria   . Migraines   . PAF and Flutter    a. Afib post-op CABG; AFlutter 10/2012  . Peripheral vascular disease ([place])   . Post-transplant erythrocytosis   . Renal disease   . S/P living-donor kidney transplantation   . Sinus node dysfunction-post termination pause    a. >8sec in setting of aflutter 10/2012  . Sleep pattern disturbance   . Stroke [facility]) 1995; 2001   denies residual  . Superficial vein thrombosis    a. R greater saphenous 04/2012  .  Vitamin D deficiency   . VT (ventricular tachycardia) ([place])     Past Surgical History:  Procedure Laterality Date  . ATRIAL FIBRILLATION ABLATION N/A 08/21/2017   Procedure: Atrial Fibrillation Ablation;  Surgeon: [name], MD;  Location: [place] CV LAB;  Service: Cardiovascular;  Laterality: N/A;  . ATRIAL FLUTTER ABLATION N/A 11/09/2012   Procedure: ATRIAL FLUTTER ABLATION;  Surgeon: [name], MD;  Location: [facility] CATH LAB;  Service: Cardiovascular;  Laterality: N/A;  . AV FISTULA PLACEMENT  2011   left forearm  . AV FISTULA PLACEMENT    . CARDIAC CATHETERIZATION  03/16/12  . CARDIOVERSION N/A 06/10/2017    Procedure: CARDIOVERSION;  Surgeon: Pixie Casino, MD;  Location: [facility] ENDOSCOPY;  Service: Cardiovascular;  Laterality: N/A;  . CARDIOVERSION N/A 10/13/2017   Procedure: CARDIOVERSION;  Surgeon: Dorothy Spark, MD;  Location: [facility] ENDOSCOPY;  Service: Cardiovascular;  Laterality: N/A;  . CARDIOVERSION N/A 10/08/2018   Procedure: CARDIOVERSION;  Surgeon: Larey Dresser, MD;  Location: [facility] ENDOSCOPY;  Service: Cardiovascular;  Laterality: N/A;  . CARDIOVERSION N/A 04/27/2019   Procedure: CARDIOVERSION;  Surgeon: Dorothy Spark, MD;  Location: [place];  Service: Cardiovascular;  Laterality: N/A;  . CORONARY ARTERY BYPASS GRAFT  03/19/2012   Procedure: CORONARY ARTERY BYPASS GRAFTING (CABG);  Surgeon: Rexene Alberts, MD;  Location: [place];  Service: Open Heart Surgery;  Laterality: N/A;  (B) MAMMARY  . CYSTOSCOPY WITH BIOPSY N/A 08/15/2016   Procedure: CYSTOSCOPY WITH IDENTIFICATION OF RIGHT TRANSPLANTATION OF URETRAL ORFICE WITH FLUORESCEIN;  Surgeon: Carolan Clines, MD;  Location: [facility] ORS;  Service: Urology;  Laterality: N/A;  . DENTAL SURGERY  2008   multiple  . [place]   left  . FRACTURE SURGERY    . HERNIA REPAIR  59/5638   umbilical  . HOLMIUM LASER APPLICATION N/A 7/56/4332   Procedure: HOLMIUM LASER APPLICATION WITH 3 CM RIGHT BLADDER STONE;  Surgeon: Carolan Clines, MD;  Location: [facility] ORS;  Service: Urology;  Laterality: N/A;  . INGUINAL HERNIA REPAIR  09/2009   bilaterally  . KIDNEY TRANSPLANT    . LEFT HEART CATHETERIZATION WITH CORONARY ANGIOGRAM N/A 03/16/2012   Procedure: LEFT HEART CATHETERIZATION WITH CORONARY ANGIOGRAM;  Surgeon: Sherren Mocha, MD;  Location: [facility] CATH LAB;  Service: Cardiovascular;  Laterality: N/A;  . RENAL ARTERY STENT  2001  . TEE WITHOUT CARDIOVERSION N/A 08/21/2017   Procedure: TRANSESOPHAGEAL ECHOCARDIOGRAM (TEE);  Surgeon: Larey Dresser, MD;  Location: [facility] ENDOSCOPY;  Service: Cardiovascular;  Laterality: N/A;  .  TRANSURETHRAL RESECTION OF BLADDER TUMOR N/A 08/15/2016   Procedure: TRANSURETHRAL BIOPSY OF RIGHT BLADDER WALL;  Surgeon: Carolan Clines, MD;  Location: [facility];  Service: Urology;  Laterality: N/A;    Current Outpatient Medications  Medication Sig Dispense Refill  . allopurinol (ZYLOPRIM) 100 MG tablet Take 200 mg by mouth daily.     Marland Kitchen amiodarone (PACERONE) 200 MG tablet Take 200 mg by mouth daily.    . cloNIDine (CATAPRES) 0.2 MG tablet Take 0.2 mg by mouth at bedtime.     Marland Kitchen diltiazem (CARDIZEM) 60 MG tablet Take 1 tablet as needed every 6 hours for rapid afib HR over 100. 45 tablet 1  . ELIQUIS 5 MG TABS tablet TAKE 1 TABLET BY MOUTH TWICE A DAY 60 tablet 5  . fenofibrate micronized (LOFIBRA) 134 MG capsule Take 134 mg by mouth daily.     Vanessa Kick Ethyl (VASCEPA) 1 g CAPS Take 2 capsules (2 g total)  by mouth 2 (two) times daily. 120 capsule 11  . magnesium oxide (MAG-OX) 400 (241.3 Mg) MG tablet Take 400 mg by mouth 2 (two) times daily.    . metoprolol tartrate (LOPRESSOR) 25 MG tablet Take 25 mg by mouth daily.     . mycophenolate (MYFORTIC) 180 MG EC tablet Take 720 mg by mouth 2 (two) times daily.     Marland Kitchen omeprazole (PRILOSEC) 20 MG capsule Take 20 mg by mouth daily.  5  . predniSONE (DELTASONE) 5 MG tablet Take 5 mg by mouth at bedtime.    . rosuvastatin (CRESTOR) 20 MG tablet Take 1 tablet (20 mg total) by mouth daily. 90 tablet 3  . sulfamethoxazole-trimethoprim (BACTRIM,SEPTRA) 400-80 MG tablet Take 1 tablet by mouth every Monday, Wednesday, and Friday.  6  . tacrolimus (PROGRAF) 0.5 MG capsule Take 0.5 mg by mouth 2 (two) times daily.   2  . tadalafil (CIALIS) 5 MG tablet Take 5 mg by mouth at bedtime.   11  . Vitamin D, Ergocalciferol, (DRISDOL) 50000 units CAPS capsule Take 50,000 Units by mouth 2 (two) times a week. Tuesdays and Saturdays  5   No current facility-administered medications for this visit.     Allergies  Allergen Reactions  . Contrast Media [Iodinated  Diagnostic Agents] Other (See Comments)    Pt states that this medication causes him to code.   Marland Kitchen Penicillins Itching, Rash and Other (See Comments)    Has patient had a PCN reaction causing immediate rash, facial/tongue/throat swelling, SOB or lightheadedness with hypotension: No Has patient had a PCN reaction causing severe rash involving mucus membranes or skin necrosis: No Has patient had a PCN reaction that required hospitalization No Has patient had a PCN reaction occurring within the last 10 years: No If all of the above answers are "NO", then may proceed with Cephalosporin use.  Marland Kitchen Lisinopril     Increased Cr      Review of Systems negative except from HPI and PMH  BP 110/80   Pulse (!) 54   Ht 5\' 8"  (1.727 m)   Wt 202 lb 6.4 oz (91.8 kg)   SpO2 98%   BMI 30.77 kg/m  Well developed and nourished in no acute distress HENT normal Neck supple with JVP-  flat Clear Regular rate and rhythm, 2/6 murmurs or gallops Abd-soft with active BS No Clubbing cyanosis edema Skin-warm and dry A & Oriented  Grossly normal sensory and motor function  ECG sinus rhythm at 54 Little 17/13/51 Axis left -27 T waves inversion V3-V6 and 1-L     Assessment and  Plan   Atrial Flutter  S/p RFCA-- recurrent and atypical s/p ablation 2018-JA  Freq PACs  LAE  LVH-Asymmetric 18/11  HCM    CAD s/p CABG 2013  Sleep disordered breathing   Sinus bradycardia  Status post kidney transplant   He continues to have episodic atrial arrhythmia.

## 2019-06-30 NOTE — Patient Instructions (Signed)
Medication Instructions:  Your physician recommends that you continue on your current medications as directed. Please refer to the Current Medication list given to you today.  Labwork: None ordered.  Testing/Procedures: None ordered.  Follow-Up: Your physician recommends that you schedule a follow-up appointment in:   24 mo with Dr. Caryl Comes   Any Other Special Instructions Will Be Listed Below (If Applicable).     If you need a refill on your cardiac medications before your next appointment, please call your pharmacy.

## 2019-07-01 MED FILL — MYCOPHENOLIC ACID DR 180 MG: 180 | 30 days supply | Qty: 240 | Fill #1

## 2019-07-02 DIAGNOSIS — I129 Hypertensive chronic kidney disease with stage 1 through stage 4 chronic kidney disease, or unspecified chronic kidney disease: Secondary | ICD-10-CM | POA: Diagnosis not present

## 2019-07-02 DIAGNOSIS — E559 Vitamin D deficiency, unspecified: Secondary | ICD-10-CM | POA: Diagnosis not present

## 2019-07-02 DIAGNOSIS — Z940 Kidney transplant status: Secondary | ICD-10-CM | POA: Diagnosis not present

## 2019-07-08 DIAGNOSIS — E785 Hyperlipidemia, unspecified: Secondary | ICD-10-CM | POA: Diagnosis not present

## 2019-07-08 DIAGNOSIS — I2510 Atherosclerotic heart disease of native coronary artery without angina pectoris: Secondary | ICD-10-CM | POA: Diagnosis not present

## 2019-07-08 DIAGNOSIS — I4892 Unspecified atrial flutter: Secondary | ICD-10-CM | POA: Diagnosis not present

## 2019-07-08 DIAGNOSIS — I4891 Unspecified atrial fibrillation: Secondary | ICD-10-CM | POA: Diagnosis not present

## 2019-07-08 DIAGNOSIS — D751 Secondary polycythemia: Secondary | ICD-10-CM | POA: Diagnosis not present

## 2019-07-08 DIAGNOSIS — Z940 Kidney transplant status: Secondary | ICD-10-CM | POA: Diagnosis not present

## 2019-07-08 DIAGNOSIS — I129 Hypertensive chronic kidney disease with stage 1 through stage 4 chronic kidney disease, or unspecified chronic kidney disease: Secondary | ICD-10-CM | POA: Diagnosis not present

## 2019-07-08 MED FILL — TADALAFIL 5 MG TABS: 5 | 30 days supply | Qty: 30 | Fill #1

## 2019-07-08 MED FILL — ELIQUIS 5 MG TABLET: 5 | 30 days supply | Qty: 60 | Fill #1

## 2019-07-08 MED FILL — FENOFIBRATE 134 MG CAPSULE: 134 | 30 days supply | Qty: 30 | Fill #1

## 2019-07-15 DIAGNOSIS — F4320 Adjustment disorder, unspecified: Secondary | ICD-10-CM | POA: Diagnosis not present

## 2019-07-22 MED FILL — predniSONE 5 MG TABS: 5 | 30 days supply | Qty: 30 | Fill #2

## 2019-07-22 MED FILL — cloNIDine HCL 0.2 MG TABS: 0.2 | 30 days supply | Qty: 30 | Fill #2

## 2019-07-22 MED FILL — VASCEPA 1 GM CAPSULE: 1 | 30 days supply | Qty: 120 | Fill #2

## 2019-07-22 MED FILL — ALLOPURINOL 100 MG TABS: 100 | 30 days supply | Qty: 60 | Fill #2

## 2019-07-22 MED FILL — OMEPRAZOLE 20 MG CPDR: 20 | 30 days supply | Qty: 30 | Fill #2

## 2019-07-22 MED FILL — SULFAMETHOXAZOLE-TMP SS TAB: 400-80 | 28 days supply | Qty: 12 | Fill #2

## 2019-07-22 MED FILL — METOPROLOL TARTRATE 25 MG T: 25 | 30 days supply | Qty: 30 | Fill #2

## 2019-07-22 NOTE — Telephone Encounter (Signed)
Called pt regarding the EKG strips he sent in, some showing HR in 120's others showing HR in 50's. Pt reports SOB and increasing chest pain. He "feels something is wrong". Advised pt to call EMS or go to the ED to be evaluated as soon as possible. Pt agreeable.

## 2019-07-26 ENCOUNTER — Other Ambulatory Visit (HOSPITAL_COMMUNITY)
Admission: RE | Admit: 2019-07-26 | Discharge: 2019-07-26 | Disposition: A | Discharge status: Home / Self Care | Payer: 59 | Source: Ambulatory Visit | Attending: Cardiovascular Disease | Admitting: Cardiovascular Disease

## 2019-07-26 ENCOUNTER — Telehealth: Payer: Self-pay

## 2019-07-26 DIAGNOSIS — Z01812 Encounter for preprocedural laboratory examination: Secondary | ICD-10-CM | POA: Diagnosis not present

## 2019-07-26 DIAGNOSIS — Z20828 Contact with and (suspected) exposure to other viral communicable diseases: Secondary | ICD-10-CM | POA: Diagnosis not present

## 2019-07-26 MED ORDER — PREDNISONE 50 MG PO TABS: ORAL_TABLET | ORAL | 0 refills | Status: DC

## 2019-07-26 MED ORDER — CLONIDINE HCL 0.1 MG PO TABS: 0.1000 mg | ORAL_TABLET | Freq: Every day | ORAL | 3 refills | Status: DC

## 2019-07-26 MED ORDER — METOPROLOL TARTRATE 50 MG PO TABS: 50.0000 mg | ORAL_TABLET | Freq: Two times a day (BID) | ORAL | 3 refills | Status: DC

## 2019-07-26 MED FILL — METOPROLOL TARTRATE 50 MG T: 50 | 90 days supply | Qty: 180 | Fill #0

## 2019-07-26 MED FILL — CloNIDine HCL 0.1 MG TAB: 0.1 | 90 days supply | Qty: 90 | Fill #0

## 2019-07-26 NOTE — Telephone Encounter (Signed)
Pt understands his medication changes per Dr. Caryl Comes. He had no additional questions.

## 2019-07-26 NOTE — Telephone Encounter (Signed)
-----   Message from Jamal Maes, MD sent at 07/24/2019  3:01 PM EDT ----- Regarding: RE: Sounds like he needs re- cath  Cbd ----- Message ----- From: [name], MD Sent: 07/24/2019   2:54 PM EDT To: Jamal Maes, MD, Sherren Mocha, MD, #  Jenny Reichmann he is having tachycardia worsening chest pains "constricting and dyspnea"  Last Cr  is 1.26    Thoughts re cath  thanks    Fizza Scales   have decreased cloniidine 0.2>>0.1 mg daily And increased metoprolol --25 >>50 bid Needs a new Rx at [facility] I have included you on this as you did his pre Renal Transplant cath>>CABG x 5

## 2019-07-26 NOTE — Telephone Encounter (Signed)
Called to arrange LHC this Friday with Dr. Burt Knack.  Left message to call back.

## 2019-07-26 NOTE — Telephone Encounter (Addendum)
Scheduled the patient for [facility] tomorrow with Dr. Burt Knack. Confirmed with Dr. Burt Knack the patient is to [facility] starting now for procedure. Confirmed with COVID testing site that results will be complete for tomorrow. The patient is allergic to contrast- -premed instructions reviewed and meds called in to pharmacy.  Labs will be drawn upon arrival to Short Stay at 0830.  Reviewed instructions with patient. He was grateful for assistance.

## 2019-07-26 NOTE — Telephone Encounter (Signed)
-----   Message from Sherren Mocha, MD sent at 07/24/2019  3:44 PM EDT ----- Regarding: RE: Either way is fine. Webb Silversmith will put in pre-cath orders once we get it scheduled. I'll ask Lenice Llamas if she can schedule it since we're in the office Monday morning.   ----- Message ----- From: [name], MD Sent: 07/24/2019   3:23 PM EDT To: Jamal Maes, MD, Sunday Shams, RN, # Subject: RE:                                            Ronalee Belts I will call him back on Monday. - spoke to him today.  Will u arrange it would u like me to ? Thx s ----- Message ----- From: Sherren Mocha, MD Sent: 07/24/2019   3:20 PM EDT To: Jamal Maes, MD, Sunday Shams, RN, # Subject: RE:                                            Richardson Landry - I would arrange for renal protocol fluids where he gets 3 cc/kg x 1 hr, then 1 cc/kg (total 4 hours fluids prior to cath). He doesn't meet GFR criteria but with renal transplant hx probably safest to do that.  ----- Message ----- From: Jamal Maes, MD Sent: 07/24/2019   3:01 PM EDT To: [name], MD, Sherren Mocha, MD, # Subject: RE:                                            Sounds like he needs re- cath  Cbd ----- Message ----- From: [name], MD Sent: 07/24/2019   2:54 PM EDT To: Jamal Maes, MD, Sherren Mocha, MD, #  Jenny Reichmann he is having tachycardia worsening chest pains "constricting and dyspnea"  Last Cr  is 1.26    Thoughts re cath  thanks    Lorren   have decreased cloniidine 0.2>>0.1 mg daily And increased metoprolol --25 >>50 bid Needs a new Rx at [facility] I have included you on this as you did his pre Renal Transplant cath>>CABG x 5

## 2019-07-26 NOTE — Telephone Encounter (Signed)
-----   Message from Jamal Maes, MD sent at 07/24/2019  3:01 PM EDT ----- Regarding: RE: Sounds like he needs re- cath  Cbd ----- Message ----- From: [name], MD Sent: 07/24/2019   2:54 PM EDT To: Jamal Maes, MD, Sherren Mocha, MD, #  Jenny Reichmann he is having tachycardia worsening chest pains "constricting and dyspnea"  Last Cr  is 1.26    Thoughts re cath  thanks    Jaysun Wessels   have decreased cloniidine 0.2>>0.1 mg daily And increased metoprolol --25 >>50 bid Needs a new Rx at [facility] I have included you on this as you did his pre Renal Transplant cath>>CABG x 5

## 2019-07-26 NOTE — Telephone Encounter (Signed)
Instructed patient to take ASA 81 mg tomorrow morning. Reviewed contrast allergy med instructions again.  He was grateful for assistance.

## 2019-07-27 ENCOUNTER — Ambulatory Visit (HOSPITAL_COMMUNITY)
Admission: RE | Admit: 2019-07-27 | Discharge: 2019-07-27 | Disposition: A | Discharge status: Home / Self Care | Payer: 59 | Attending: Cardiovascular Disease | Admitting: Cardiovascular Disease

## 2019-07-27 ENCOUNTER — Other Ambulatory Visit: Payer: Self-pay

## 2019-07-27 ENCOUNTER — Ambulatory Visit (HOSPITAL_COMMUNITY)
Admission: RE | Disposition: A | Discharge status: Home / Self Care | Payer: 59 | Source: Home / Self Care | Attending: Cardiovascular Disease

## 2019-07-27 DIAGNOSIS — Z7901 Long term (current) use of anticoagulants: Secondary | ICD-10-CM | POA: Insufficient documentation

## 2019-07-27 DIAGNOSIS — M109 Gout, unspecified: Secondary | ICD-10-CM | POA: Diagnosis not present

## 2019-07-27 DIAGNOSIS — E559 Vitamin D deficiency, unspecified: Secondary | ICD-10-CM | POA: Insufficient documentation

## 2019-07-27 DIAGNOSIS — E213 Hyperparathyroidism, unspecified: Secondary | ICD-10-CM | POA: Insufficient documentation

## 2019-07-27 DIAGNOSIS — E785 Hyperlipidemia, unspecified: Secondary | ICD-10-CM | POA: Diagnosis not present

## 2019-07-27 DIAGNOSIS — I4892 Unspecified atrial flutter: Secondary | ICD-10-CM | POA: Insufficient documentation

## 2019-07-27 DIAGNOSIS — Z8673 Personal history of transient ischemic attack (TIA), and cerebral infarction without residual deficits: Secondary | ICD-10-CM | POA: Insufficient documentation

## 2019-07-27 DIAGNOSIS — I25118 Atherosclerotic heart disease of native coronary artery with other forms of angina pectoris: Secondary | ICD-10-CM | POA: Insufficient documentation

## 2019-07-27 DIAGNOSIS — Z880 Allergy status to penicillin: Secondary | ICD-10-CM | POA: Insufficient documentation

## 2019-07-27 DIAGNOSIS — Z951 Presence of aortocoronary bypass graft: Secondary | ICD-10-CM | POA: Diagnosis not present

## 2019-07-27 DIAGNOSIS — I2582 Chronic total occlusion of coronary artery: Secondary | ICD-10-CM | POA: Diagnosis not present

## 2019-07-27 DIAGNOSIS — I495 Sick sinus syndrome: Secondary | ICD-10-CM | POA: Insufficient documentation

## 2019-07-27 DIAGNOSIS — N189 Chronic kidney disease, unspecified: Secondary | ICD-10-CM | POA: Diagnosis not present

## 2019-07-27 DIAGNOSIS — Z86718 Personal history of other venous thrombosis and embolism: Secondary | ICD-10-CM | POA: Insufficient documentation

## 2019-07-27 DIAGNOSIS — I129 Hypertensive chronic kidney disease with stage 1 through stage 4 chronic kidney disease, or unspecified chronic kidney disease: Secondary | ICD-10-CM | POA: Diagnosis not present

## 2019-07-27 DIAGNOSIS — Z91041 Radiographic dye allergy status: Secondary | ICD-10-CM | POA: Insufficient documentation

## 2019-07-27 DIAGNOSIS — Z79899 Other long term (current) drug therapy: Secondary | ICD-10-CM | POA: Insufficient documentation

## 2019-07-27 DIAGNOSIS — G4730 Sleep apnea, unspecified: Secondary | ICD-10-CM | POA: Diagnosis not present

## 2019-07-27 DIAGNOSIS — I422 Other hypertrophic cardiomyopathy: Secondary | ICD-10-CM | POA: Insufficient documentation

## 2019-07-27 DIAGNOSIS — Z888 Allergy status to other drugs, medicaments and biological substances status: Secondary | ICD-10-CM | POA: Insufficient documentation

## 2019-07-27 DIAGNOSIS — I209 Angina pectoris, unspecified: Secondary | ICD-10-CM | POA: Diagnosis present

## 2019-07-27 DIAGNOSIS — I2584 Coronary atherosclerosis due to calcified coronary lesion: Secondary | ICD-10-CM | POA: Diagnosis not present

## 2019-07-27 DIAGNOSIS — I480 Paroxysmal atrial fibrillation: Secondary | ICD-10-CM | POA: Diagnosis not present

## 2019-07-27 DIAGNOSIS — Z940 Kidney transplant status: Secondary | ICD-10-CM | POA: Diagnosis not present

## 2019-07-27 DIAGNOSIS — I739 Peripheral vascular disease, unspecified: Secondary | ICD-10-CM | POA: Insufficient documentation

## 2019-07-27 HISTORY — PX: LEFT HEART CATH AND CORS/GRAFTS ANGIOGRAPHY: CATH118250

## 2019-07-27 LAB — BASIC METABOLIC PANEL
Anion gap: 7 (ref 5–15)
BUN: 21 mg/dL — ABNORMAL HIGH (ref 6–20)
CO2: 24 mmol/L (ref 22–32)
Calcium: 9.6 mg/dL (ref 8.9–10.3)
Chloride: 104 mmol/L (ref 98–111)
Creatinine, Ser: 1.66 mg/dL — ABNORMAL HIGH (ref 0.61–1.24)
GFR calc Af Amer: 53 mL/min — ABNORMAL LOW (ref >60)
GFR calc non Af Amer: 45 mL/min — ABNORMAL LOW (ref >60)
Glucose, Bld: 154 mg/dL — ABNORMAL HIGH (ref 70–99)
Potassium: 4.2 mmol/L (ref 3.5–5.1)
Sodium: 135 mmol/L (ref 135–145)

## 2019-07-27 LAB — CBC
HCT: 50.1 % (ref 39.0–52.0)
Hemoglobin: 16.3 g/dL (ref 13.0–17.0)
MCH: 29.5 pg (ref 26.0–34.0)
MCHC: 32.5 g/dL (ref 30.0–36.0)
MCV: 90.8 fL (ref 80.0–100.0)
Platelets: 149 10*3/uL — ABNORMAL LOW (ref 150–400)
RBC: 5.52 MIL/uL (ref 4.22–5.81)
RDW: 13.9 % (ref 11.5–15.5)
WBC: 6.5 10*3/uL (ref 4.0–10.5)
nRBC: 0 % (ref 0.0–0.2)

## 2019-07-27 LAB — SARS CORONAVIRUS 2 (TAT 6-24 HRS): SARS Coronavirus 2: NEGATIVE

## 2019-07-27 SURGERY — LEFT HEART CATH AND CORS/GRAFTS ANGIOGRAPHY
Anesthesia: LOCAL

## 2019-07-27 MED ORDER — VERAPAMIL HCL 2.5 MG/ML IV SOLN
INTRAVENOUS | Status: AC
  Filled 2019-07-27: qty 2

## 2019-07-27 MED ORDER — LIDOCAINE HCL (PF) 1 % IJ SOLN
INTRAMUSCULAR | Status: DC | PRN
  Administered 2019-07-27: 15 mL

## 2019-07-27 MED ORDER — HEPARIN (PORCINE) IN NACL 1000-0.9 UT/500ML-% IV SOLN
INTRAVENOUS | Status: DC | PRN
  Administered 2019-07-27 (×2): 500 mL

## 2019-07-27 MED ORDER — SODIUM CHLORIDE 0.9 % IV SOLN: 250.0000 mL | INTRAVENOUS | Status: DC | PRN

## 2019-07-27 MED ORDER — SODIUM CHLORIDE 0.9 % WEIGHT BASED INFUSION
3.0000 mL/kg/h | INTRAVENOUS | Status: AC
  Administered 2019-07-27: 09:00:00 3 mL/kg/h via INTRAVENOUS

## 2019-07-27 MED ORDER — SODIUM CHLORIDE 0.9% FLUSH: 3.0000 mL | Freq: Two times a day (BID) | INTRAVENOUS | Status: DC

## 2019-07-27 MED ORDER — FENTANYL CITRATE (PF) 100 MCG/2ML IJ SOLN
INTRAMUSCULAR | Status: AC
  Filled 2019-07-27: qty 2

## 2019-07-27 MED ORDER — MIDAZOLAM HCL 2 MG/2ML IJ SOLN
INTRAMUSCULAR | Status: AC
  Filled 2019-07-27: qty 2

## 2019-07-27 MED ORDER — HEPARIN (PORCINE) IN NACL 1000-0.9 UT/500ML-% IV SOLN
INTRAVENOUS | Status: AC
  Filled 2019-07-27: qty 1000

## 2019-07-27 MED ORDER — DIPHENHYDRAMINE HCL 50 MG/ML IJ SOLN
25.0000 mg | Freq: Once | INTRAMUSCULAR | Status: AC
  Administered 2019-07-27: 25 mg via INTRAVENOUS
  Filled 2019-07-27: qty 1

## 2019-07-27 MED ORDER — SODIUM CHLORIDE 0.9 % WEIGHT BASED INFUSION: 1.0000 mL/kg/h | INTRAVENOUS | Status: DC

## 2019-07-27 MED ORDER — IOHEXOL 350 MG/ML SOLN
INTRAVENOUS | Status: DC | PRN
  Administered 2019-07-27: 75 mL via INTRAVENOUS

## 2019-07-27 MED ORDER — ASPIRIN 81 MG PO CHEW
81.0000 mg | CHEWABLE_TABLET | ORAL | Status: AC
  Administered 2019-07-27: 81 mg via ORAL
  Filled 2019-07-27: qty 1

## 2019-07-27 MED ORDER — MIDAZOLAM HCL 2 MG/2ML IJ SOLN
INTRAMUSCULAR | Status: DC | PRN
  Administered 2019-07-27: 2 mg via INTRAVENOUS
  Administered 2019-07-27: 1 mg via INTRAVENOUS

## 2019-07-27 MED ORDER — SODIUM CHLORIDE 0.9% FLUSH: 3.0000 mL | INTRAVENOUS | Status: DC | PRN

## 2019-07-27 MED ORDER — LIDOCAINE HCL (PF) 1 % IJ SOLN
INTRAMUSCULAR | Status: AC
  Filled 2019-07-27: qty 30

## 2019-07-27 MED ORDER — FENTANYL CITRATE (PF) 100 MCG/2ML IJ SOLN
INTRAMUSCULAR | Status: DC | PRN
  Administered 2019-07-27: 50 ug via INTRAVENOUS
  Administered 2019-07-27: 25 ug via INTRAVENOUS

## 2019-07-27 SURGICAL SUPPLY — 13 items
CATH INFINITI 5 FR AL2 (CATHETERS) ×1 IMPLANT
CATH INFINITI 5 FR IM (CATHETERS) ×1 IMPLANT
CATH INFINITI 5 FR LCB (CATHETERS) ×1 IMPLANT
CATH INFINITI 5FR MULTPACK ANG (CATHETERS) ×1 IMPLANT
CLOSURE MYNX CONTROL 5F (Vascular Products) ×1 IMPLANT
KIT HEART LEFT (KITS) ×2 IMPLANT
PACK CARDIAC CATHETERIZATION (CUSTOM PROCEDURE TRAY) ×2 IMPLANT
SHEATH PINNACLE 5F 10CM (SHEATH) ×1 IMPLANT
TRANSDUCER W/STOPCOCK (MISCELLANEOUS) ×2 IMPLANT
TUBING CIL FLEX 10 FLL-RA (TUBING) ×2 IMPLANT
WIRE EMERALD 3MM-J .035X150CM (WIRE) ×1 IMPLANT
WIRE EMERALD 3MM-J .035X260CM (WIRE) ×1 IMPLANT
WIRE HI TORQ VERSACORE-J 145CM (WIRE) ×1 IMPLANT

## 2019-07-27 NOTE — Progress Notes (Signed)
No bleeding or hematoma noted after ambulation 

## 2019-07-27 NOTE — Discharge Instructions (Signed)
Femoral Site Care °This sheet gives you information about how to care for yourself after your procedure. Your health care provider may also give you more specific instructions. If you have problems or questions, contact your health care provider. °What can I expect after the procedure? °After the procedure, it is common to have: °· Bruising that usually fades within 1-2 weeks. °· Tenderness at the site. °Follow these instructions at home: °Wound care °· Follow instructions from your health care provider about how to take care of your insertion site. Make sure you: °? Wash your hands with soap and water before you change your bandage (dressing). If soap and water are not available, use hand sanitizer. °? Change your dressing as told by your health care provider. °? Leave stitches (sutures), skin glue, or adhesive strips in place. These skin closures may need to stay in place for 2 weeks or longer. If adhesive strip edges start to loosen and curl up, you may trim the loose edges. Do not remove adhesive strips completely unless your health care provider tells you to do that. °· Do not take baths, swim, or use a hot tub until your health care provider approves. °· You may shower 24-48 hours after the procedure or as told by your health care provider. °? Gently wash the site with plain soap and water. °? Pat the area dry with a clean towel. °? Do not rub the site. This may cause bleeding. °· Do not apply powder or lotion to the site. Keep the site clean and dry. °· Check your femoral site every day for signs of infection. Check for: °? Redness, swelling, or pain. °? Fluid or blood. °? Warmth. °? Pus or a bad smell. °Activity °· For the first 2-3 days after your procedure, or as long as directed: °? Avoid climbing stairs as much as possible. °? Do not squat. °· Do not lift anything that is heavier than 10 lb (4.5 kg), or the limit that you are told, until your health care provider says that it is safe. °· Rest as  directed. °? Avoid sitting for a long time without moving. Get up to take short walks every 1-2 hours. °· Do not drive for 24 hours if you were given a medicine to help you relax (sedative). °General instructions °· Take over-the-counter and prescription medicines only as told by your health care provider. °· Keep all follow-up visits as told by your health care provider. This is important. °Contact a health care provider if you have: °· A fever or chills. °· You have redness, swelling, or pain around your insertion site. °Get help right away if: °· The catheter insertion area swells very fast. °· You pass out. °· You suddenly start to sweat or your skin gets clammy. °· The catheter insertion area is bleeding, and the bleeding does not stop when you hold steady pressure on the area. °· The area near or just beyond the catheter insertion site becomes pale, cool, tingly, or numb. °These symptoms may represent a serious problem that is an emergency. Do not wait to see if the symptoms will go away. Get medical help right away. Call your local emergency services (911 in the [place] Do not drive yourself to the hospital. °Summary °· After the procedure, it is common to have bruising that usually fades within 1-2 weeks. °· Check your femoral site every day for signs of infection. °· Do not lift anything that is heavier than 10 lb (4.5 kg), or the   limit that you are told, until your health care provider says that it is safe. °This information is not intended to replace advice given to you by your health care provider. Make sure you discuss any questions you have with your health care provider. °Document Released: 07/22/2014 Document Revised: 12/01/2017 Document Reviewed: 12/01/2017 °Elsevier Patient Education © 2020 [employer] ° °

## 2019-07-27 NOTE — Interval H&P Note (Signed)
Cath Lab Visit (complete for each Cath Lab visit)  Clinical Evaluation Leading to the Procedure:   ACS: No.  Non-ACS:    Anginal Classification: CCS III  Anti-ischemic medical therapy: Maximal Therapy (2 or more classes of medications)  Non-Invasive Test Results: No non-invasive testing performed  Prior CABG: Previous CABG  The patient has called in with progressive anginal symptoms since the time of this evaluation.  He is currently exhibiting CCS class III symptoms of exertional angina on low-level activity.  He has a previous AV fistula in the left arm and with his previous CABG, I will plan right femoral access.  History and Physical Interval Note:  07/27/2019 10:45 AM  [name]  has presented today for surgery, with the diagnosis of chest pain - shortness of breath.  The various methods of treatment have been discussed with the patient and family. After consideration of risks, benefits and other options for treatment, the patient has consented to  Procedure(s): LEFT HEART CATH AND CORS/GRAFTS ANGIOGRAPHY (N/A) as a surgical intervention.  The patient's history has been reviewed, patient examined, no change in status, stable for surgery.  I have reviewed the patient's chart and labs.  Questions were answered to the patient's satisfaction.     Sherren Mocha

## 2019-07-28 ENCOUNTER — Encounter (HOSPITAL_COMMUNITY): Payer: Self-pay | Admitting: Cardiovascular Disease

## 2019-07-28 MED FILL — Verapamil HCl IV Soln 2.5 MG/ML: INTRAVENOUS | Qty: 2 | Status: AC

## 2019-08-05 DIAGNOSIS — R739 Hyperglycemia, unspecified: Secondary | ICD-10-CM | POA: Diagnosis not present

## 2019-08-05 DIAGNOSIS — M109 Gout, unspecified: Secondary | ICD-10-CM | POA: Diagnosis not present

## 2019-08-05 DIAGNOSIS — Z940 Kidney transplant status: Secondary | ICD-10-CM | POA: Diagnosis not present

## 2019-08-05 DIAGNOSIS — I129 Hypertensive chronic kidney disease with stage 1 through stage 4 chronic kidney disease, or unspecified chronic kidney disease: Secondary | ICD-10-CM | POA: Diagnosis not present

## 2019-08-05 MED FILL — ELIQUIS 5 MG TABLET: 5 | 30 days supply | Qty: 60 | Fill #2

## 2019-08-05 MED FILL — MYCOPHENOLIC ACID DR 180 MG: 180 | 30 days supply | Qty: 240 | Fill #0

## 2019-08-05 MED FILL — FENOFIBRATE 134 MG CAPSULE: 134 | 30 days supply | Qty: 30 | Fill #2

## 2019-08-05 MED FILL — TADALAFIL 5 MG TABS: 5 | 30 days supply | Qty: 30 | Fill #2

## 2019-08-05 MED FILL — TACROLIMUS 0.5 MG CAPSULE: 0.5 | 30 days supply | Qty: 30 | Fill #0

## 2019-08-19 MED FILL — predniSONE 5 MG TABS: 5 | 30 days supply | Qty: 30 | Fill #3

## 2019-08-19 MED FILL — ALLOPURINOL 100 MG TABS: 100 | 30 days supply | Qty: 60 | Fill #3

## 2019-08-19 MED FILL — SULFAMETHOXAZOLE-TMP SS TAB: 400-80 | 28 days supply | Qty: 12 | Fill #0

## 2019-08-25 DIAGNOSIS — F4320 Adjustment disorder, unspecified: Secondary | ICD-10-CM | POA: Diagnosis not present

## 2019-08-26 MED FILL — VASCEPA 1 GM CAPSULE: 1 | 30 days supply | Qty: 120 | Fill #3

## 2019-08-27 MED FILL — OMEPRAZOLE 20 MG CAPSULE DR: 20 | 30 days supply | Qty: 30 | Fill #0

## 2019-09-02 MED FILL — ELIQUIS 5 MG TABLET: 5 | 30 days supply | Qty: 60 | Fill #3

## 2019-09-02 MED FILL — MYCOPHENOLIC ACID DR 180 MG: 180 | 30 days supply | Qty: 240 | Fill #1

## 2019-09-02 MED FILL — TADALAFIL 5 MG TABS: 5 | 30 days supply | Qty: 30 | Fill #3

## 2019-09-02 MED FILL — FENOFIBRATE 134 MG CAPSULE: 134 | 30 days supply | Qty: 30 | Fill #3

## 2019-09-02 MED FILL — TACROLIMUS ANHYDROUS 0.5MG: 0.5 | 30 days supply | Qty: 30 | Fill #1

## 2019-09-06 DIAGNOSIS — D751 Secondary polycythemia: Secondary | ICD-10-CM | POA: Diagnosis not present

## 2019-09-06 DIAGNOSIS — E785 Hyperlipidemia, unspecified: Secondary | ICD-10-CM | POA: Diagnosis not present

## 2019-09-06 DIAGNOSIS — I129 Hypertensive chronic kidney disease with stage 1 through stage 4 chronic kidney disease, or unspecified chronic kidney disease: Secondary | ICD-10-CM | POA: Diagnosis not present

## 2019-09-06 DIAGNOSIS — I4891 Unspecified atrial fibrillation: Secondary | ICD-10-CM | POA: Diagnosis not present

## 2019-09-06 DIAGNOSIS — I4892 Unspecified atrial flutter: Secondary | ICD-10-CM | POA: Diagnosis not present

## 2019-09-06 DIAGNOSIS — I2510 Atherosclerotic heart disease of native coronary artery without angina pectoris: Secondary | ICD-10-CM | POA: Diagnosis not present

## 2019-09-06 DIAGNOSIS — Z940 Kidney transplant status: Secondary | ICD-10-CM | POA: Diagnosis not present

## 2019-09-16 MED FILL — predniSONE 5 MG TABS: 5 | 30 days supply | Qty: 30 | Fill #4

## 2019-09-16 MED FILL — ALLOPURINOL 100 MG TABS: 100 | 30 days supply | Qty: 60 | Fill #4

## 2019-09-16 MED FILL — SULFAMETHOXAZOLE-TMP SS TAB: 400-80 | 28 days supply | Qty: 12 | Fill #1

## 2019-09-23 MED FILL — OMEPRAZOLE 20 MG CAPSULE DR: 20 | 30 days supply | Qty: 30 | Fill #1

## 2019-09-23 MED FILL — VASCEPA 1 GM CAPSULE: 1 | 30 days supply | Qty: 120 | Fill #4

## 2019-09-30 MED FILL — TADALAFIL 5 MG TABS: 5 | 30 days supply | Qty: 30 | Fill #4

## 2019-09-30 MED FILL — ELIQUIS 5 MG TABLET: 5 | 30 days supply | Qty: 60 | Fill #4

## 2019-09-30 MED FILL — TACROLIMUS ANHYDROUS 0.5MG: 0.5 | 30 days supply | Qty: 30 | Fill #2

## 2019-09-30 MED FILL — FENOFIBRATE 134 MG CAPSULE: 134 | 30 days supply | Qty: 30 | Fill #4

## 2019-09-30 MED FILL — MYCOPHENOLIC ACID DR 180 MG: 180 | 30 days supply | Qty: 240 | Fill #2

## 2019-09-30 MED FILL — AMIODARONE HCL 200 MG TAB: 200 | 90 days supply | Qty: 180 | Fill #0

## 2019-10-13 DIAGNOSIS — B353 Tinea pedis: Secondary | ICD-10-CM | POA: Diagnosis not present

## 2019-10-13 DIAGNOSIS — B351 Tinea unguium: Secondary | ICD-10-CM | POA: Diagnosis not present

## 2019-10-14 MED FILL — ALLOPURINOL 100 MG TABS: 100 | 30 days supply | Qty: 60 | Fill #0

## 2019-10-14 MED FILL — predniSONE 5 MG TABS: 5 | 30 days supply | Qty: 30 | Fill #0

## 2019-10-14 MED FILL — SULFAMETHOXAZOLE-TMP SS TAB: 400-80 | 28 days supply | Qty: 12 | Fill #2

## 2019-10-21 MED FILL — CloNIDine HCL 0.1 MG TAB: 0.1 | 90 days supply | Qty: 90 | Fill #1

## 2019-10-21 MED FILL — METOPROLOL TARTRATE 50 MG T: 50 | 90 days supply | Qty: 180 | Fill #1

## 2019-10-21 MED FILL — VASCEPA 1 GM CAPSULE: 1 | 30 days supply | Qty: 120 | Fill #5

## 2019-11-03 DIAGNOSIS — I2510 Atherosclerotic heart disease of native coronary artery without angina pectoris: Secondary | ICD-10-CM | POA: Diagnosis not present

## 2019-11-03 DIAGNOSIS — D751 Secondary polycythemia: Secondary | ICD-10-CM | POA: Diagnosis not present

## 2019-11-03 DIAGNOSIS — Z940 Kidney transplant status: Secondary | ICD-10-CM | POA: Diagnosis not present

## 2019-11-03 DIAGNOSIS — I4892 Unspecified atrial flutter: Secondary | ICD-10-CM | POA: Diagnosis not present

## 2019-11-03 DIAGNOSIS — E785 Hyperlipidemia, unspecified: Secondary | ICD-10-CM | POA: Diagnosis not present

## 2019-11-03 DIAGNOSIS — I4891 Unspecified atrial fibrillation: Secondary | ICD-10-CM | POA: Diagnosis not present

## 2019-11-03 DIAGNOSIS — I129 Hypertensive chronic kidney disease with stage 1 through stage 4 chronic kidney disease, or unspecified chronic kidney disease: Secondary | ICD-10-CM | POA: Diagnosis not present

## 2019-11-10 ENCOUNTER — Ambulatory Visit (HOSPITAL_COMMUNITY)
Admission: RE | Admit: 2019-11-10 | Discharge: 2019-11-10 | Disposition: A | Discharge status: Home / Self Care | Payer: 59 | Source: Ambulatory Visit | Attending: Nurse Practitioner | Admitting: Nurse Practitioner

## 2019-11-10 ENCOUNTER — Encounter (HOSPITAL_COMMUNITY): Payer: Self-pay | Admitting: Nurse Practitioner

## 2019-11-10 ENCOUNTER — Other Ambulatory Visit: Payer: Self-pay

## 2019-11-10 VITALS — BP 112/70 | HR 49 | Ht 68.0 in | Wt 215.2 lb

## 2019-11-10 DIAGNOSIS — I422 Other hypertrophic cardiomyopathy: Secondary | ICD-10-CM | POA: Insufficient documentation

## 2019-11-10 DIAGNOSIS — Z951 Presence of aortocoronary bypass graft: Secondary | ICD-10-CM | POA: Diagnosis not present

## 2019-11-10 DIAGNOSIS — N189 Chronic kidney disease, unspecified: Secondary | ICD-10-CM | POA: Insufficient documentation

## 2019-11-10 DIAGNOSIS — Z880 Allergy status to penicillin: Secondary | ICD-10-CM | POA: Diagnosis not present

## 2019-11-10 DIAGNOSIS — I472 Ventricular tachycardia: Secondary | ICD-10-CM | POA: Diagnosis not present

## 2019-11-10 DIAGNOSIS — Z7901 Long term (current) use of anticoagulants: Secondary | ICD-10-CM | POA: Insufficient documentation

## 2019-11-10 DIAGNOSIS — D6869 Other thrombophilia: Secondary | ICD-10-CM | POA: Diagnosis not present

## 2019-11-10 DIAGNOSIS — Z8673 Personal history of transient ischemic attack (TIA), and cerebral infarction without residual deficits: Secondary | ICD-10-CM | POA: Insufficient documentation

## 2019-11-10 DIAGNOSIS — Z5181 Encounter for therapeutic drug level monitoring: Secondary | ICD-10-CM | POA: Diagnosis not present

## 2019-11-10 DIAGNOSIS — I129 Hypertensive chronic kidney disease with stage 1 through stage 4 chronic kidney disease, or unspecified chronic kidney disease: Secondary | ICD-10-CM | POA: Insufficient documentation

## 2019-11-10 DIAGNOSIS — Z940 Kidney transplant status: Secondary | ICD-10-CM | POA: Diagnosis not present

## 2019-11-10 DIAGNOSIS — Z7952 Long term (current) use of systemic steroids: Secondary | ICD-10-CM | POA: Diagnosis not present

## 2019-11-10 DIAGNOSIS — Z91041 Radiographic dye allergy status: Secondary | ICD-10-CM | POA: Diagnosis not present

## 2019-11-10 DIAGNOSIS — I25810 Atherosclerosis of coronary artery bypass graft(s) without angina pectoris: Secondary | ICD-10-CM | POA: Diagnosis not present

## 2019-11-10 DIAGNOSIS — R001 Bradycardia, unspecified: Secondary | ICD-10-CM | POA: Diagnosis not present

## 2019-11-10 DIAGNOSIS — I4891 Unspecified atrial fibrillation: Secondary | ICD-10-CM | POA: Diagnosis present

## 2019-11-10 DIAGNOSIS — M109 Gout, unspecified: Secondary | ICD-10-CM | POA: Diagnosis not present

## 2019-11-10 DIAGNOSIS — Z87891 Personal history of nicotine dependence: Secondary | ICD-10-CM | POA: Diagnosis not present

## 2019-11-10 DIAGNOSIS — Z8249 Family history of ischemic heart disease and other diseases of the circulatory system: Secondary | ICD-10-CM | POA: Insufficient documentation

## 2019-11-10 DIAGNOSIS — Z888 Allergy status to other drugs, medicaments and biological substances status: Secondary | ICD-10-CM | POA: Diagnosis not present

## 2019-11-10 DIAGNOSIS — Z86718 Personal history of other venous thrombosis and embolism: Secondary | ICD-10-CM | POA: Insufficient documentation

## 2019-11-10 DIAGNOSIS — J984 Other disorders of lung: Secondary | ICD-10-CM | POA: Diagnosis not present

## 2019-11-10 DIAGNOSIS — Z79899 Other long term (current) drug therapy: Secondary | ICD-10-CM | POA: Diagnosis not present

## 2019-11-10 DIAGNOSIS — I454 Nonspecific intraventricular block: Secondary | ICD-10-CM | POA: Insufficient documentation

## 2019-11-10 DIAGNOSIS — I739 Peripheral vascular disease, unspecified: Secondary | ICD-10-CM | POA: Diagnosis not present

## 2019-11-10 DIAGNOSIS — I480 Paroxysmal atrial fibrillation: Secondary | ICD-10-CM | POA: Insufficient documentation

## 2019-11-10 DIAGNOSIS — E785 Hyperlipidemia, unspecified: Secondary | ICD-10-CM | POA: Diagnosis not present

## 2019-11-10 DIAGNOSIS — R739 Hyperglycemia, unspecified: Secondary | ICD-10-CM | POA: Diagnosis not present

## 2019-11-10 MED FILL — predniSONE 5 MG TABS: 5 | 30 days supply | Qty: 30 | Fill #1

## 2019-11-10 MED FILL — SULFAMETHOXAZOLE-TMP SS TAB: 400-80 | 28 days supply | Qty: 12 | Fill #3

## 2019-11-10 MED FILL — ALLOPURINOL 100 MG TABS: 100 | 30 days supply | Qty: 60 | Fill #1

## 2019-11-10 NOTE — Progress Notes (Signed)
Primary Care Physician: Patient, No Pcp Per Referring Physician: Dr. Hanley Hays R Kimberling is a 58 y.o. male with a h/o afib s/p ablation x2. Currently no issues with palpitations. Conitnues on amiodarone 200 mg daily. No issues with eliquis for a CHA2DS2VASc of at least 4.  Today, he denies symptoms of palpitations, chest pain, shortness of breath, orthopnea, PND, lower extremity edema, dizziness, presyncope, syncope, or neurologic sequela. The patient is tolerating medications without difficulties and is otherwise without complaint today.   Past Medical History:  Diagnosis Date  . Arteriovenous fistula, acquired ([place])   . Atrial fibrillation and flutter ([place])   . Bladder stones   . BMI 31.0-31.9,adult   . CAD (coronary artery disease)    a. s/p CABG 03/2012: LIMA to LAD, free RIMA to OM1, SVG to D1, sequential SVG to AM and LPLB2, EVH via right thigh and leg.  . Cellulitis 04/13/2012   RLE saphenous vein harvest incision   . CKD (chronic kidney disease)   . Dyslipidemia   . Gout   . History of kidney transplant   . Hyperglycemia   . Hyperlipidemia   . Hyperparathyroidism   . Hypertension   . Hypertrophic cardiomyopathy ([place])   . Hypomagnesemia   . Immunosuppression ([place])   . Intermittent palpitations   . Microscopic hematuria   . Migraines   . PAF and Flutter    a. Afib post-op CABG; AFlutter 10/2012  . Peripheral vascular disease ([place])   . Post-transplant erythrocytosis   . Renal disease   . S/P living-donor kidney transplantation   . Sinus node dysfunction-post termination pause    a. >8sec in setting of aflutter 10/2012  . Sleep pattern disturbance   . Stroke [facility]) 1995; 2001   denies residual  . Superficial vein thrombosis    a. R greater saphenous 04/2012  . Vitamin D deficiency   . VT (ventricular tachycardia) ([place])    Past Surgical History:  Procedure Laterality Date  . ATRIAL FIBRILLATION ABLATION N/A 08/21/2017   Procedure: Atrial Fibrillation  Ablation;  Surgeon: [name], MD;  Location: [place] CV LAB;  Service: Cardiovascular;  Laterality: N/A;  . ATRIAL FLUTTER ABLATION N/A 11/09/2012   Procedure: ATRIAL FLUTTER ABLATION;  Surgeon: [name], MD;  Location: [facility] CATH LAB;  Service: Cardiovascular;  Laterality: N/A;  . AV FISTULA PLACEMENT  2011   left forearm  . AV FISTULA PLACEMENT    . CARDIAC CATHETERIZATION  03/16/12  . CARDIOVERSION N/A 06/10/2017   Procedure: CARDIOVERSION;  Surgeon: Pixie Casino, MD;  Location: [facility] ENDOSCOPY;  Service: Cardiovascular;  Laterality: N/A;  . CARDIOVERSION N/A 10/13/2017   Procedure: CARDIOVERSION;  Surgeon: Dorothy Spark, MD;  Location: [facility] ENDOSCOPY;  Service: Cardiovascular;  Laterality: N/A;  . CARDIOVERSION N/A 10/08/2018   Procedure: CARDIOVERSION;  Surgeon: Larey Dresser, MD;  Location: [facility] ENDOSCOPY;  Service: Cardiovascular;  Laterality: N/A;  . CARDIOVERSION N/A 04/27/2019   Procedure: CARDIOVERSION;  Surgeon: Dorothy Spark, MD;  Location: [place];  Service: Cardiovascular;  Laterality: N/A;  . CORONARY ARTERY BYPASS GRAFT  03/19/2012   Procedure: CORONARY ARTERY BYPASS GRAFTING (CABG);  Surgeon: Rexene [place], MD;  Location: [place];  Service: Open Heart Surgery;  Laterality: N/A;  (B) MAMMARY  . CYSTOSCOPY WITH BIOPSY N/A 08/15/2016   Procedure: CYSTOSCOPY WITH IDENTIFICATION OF RIGHT TRANSPLANTATION OF URETRAL ORFICE WITH FLUORESCEIN;  Surgeon: Carolan Clines, MD;  [facility] ORS;  Service: Urology;  Laterality: N/A;  .  DENTAL SURGERY  2008   multiple  . [place]   left  . FRACTURE SURGERY    . HERNIA REPAIR  [id number]   umbilical  . HOLMIUM LASER APPLICATION N/A [id number]   Procedure: HOLMIUM LASER APPLICATION WITH 3 CM RIGHT BLADDER STONE;  Surgeon: Carolan Clines, MD;  [facility];  Service: Urology;  Laterality: N/A;  . INGUINAL HERNIA REPAIR  09/2009   bilaterally  . KIDNEY TRANSPLANT    . LEFT HEART CATH AND  CORS/GRAFTS ANGIOGRAPHY N/A 07/27/2019   Procedure: LEFT HEART CATH AND CORS/GRAFTS ANGIOGRAPHY;  Surgeon: Sherren Mocha, MD;  Location: [place] CV LAB;  Service: Cardiovascular;  Laterality: N/A;  . LEFT HEART CATHETERIZATION WITH CORONARY ANGIOGRAM N/A 03/16/2012   Procedure: LEFT HEART CATHETERIZATION WITH CORONARY ANGIOGRAM;  Surgeon: Sherren Mocha, MD;  Location: [facility] CATH LAB;  Service: Cardiovascular;  Laterality: N/A;  . RENAL ARTERY STENT  2001  . TEE WITHOUT CARDIOVERSION N/A 08/21/2017   Procedure: TRANSESOPHAGEAL ECHOCARDIOGRAM (TEE);  Surgeon: Larey Dresser, MD;  Location: [facility] ENDOSCOPY;  Service: Cardiovascular;  Laterality: N/A;  . TRANSURETHRAL RESECTION OF BLADDER TUMOR N/A 08/15/2016   Procedure: TRANSURETHRAL BIOPSY OF RIGHT BLADDER WALL;  Surgeon: Carolan Clines, MD;  [facility];  Service: Urology;  Laterality: N/A;    Current Outpatient Medications  Medication Sig Dispense Refill  . allopurinol (ZYLOPRIM) 100 MG tablet Take 200 mg by mouth daily.     Marland Kitchen amiodarone (PACERONE) 200 MG tablet Take 200 mg by mouth daily.    . cloNIDine (CATAPRES) 0.1 MG tablet Take 1 tablet (0.1 mg total) by mouth daily. 90 tablet 3  . diltiazem (CARDIZEM) 60 MG tablet Take 1 tablet as needed every 6 hours for rapid afib HR over 100. 45 tablet 1  . ELIQUIS 5 MG TABS tablet TAKE 1 TABLET BY MOUTH TWICE A DAY 60 tablet 5  . fenofibrate micronized (LOFIBRA) 134 MG capsule Take 134 mg by mouth daily.     Vanessa Kick Ethyl (VASCEPA) 1 g CAPS Take 2 capsules (2 g total) by mouth 2 (two) times daily. 120 capsule 11  . magnesium oxide (MAG-OX) 400 (241.3 Mg) MG tablet Take 400 mg by mouth 2 (two) times daily.    . metoprolol tartrate (LOPRESSOR) 50 MG tablet Take 1 tablet (50 mg total) by mouth 2 (two) times daily. 180 tablet 3  . mycophenolate (MYFORTIC) 180 MG EC tablet Take 720 mg by mouth 2 (two) times daily.     Marland Kitchen omeprazole (PRILOSEC) 20 MG capsule Take 20 mg by mouth daily.  5  .  predniSONE (DELTASONE) 5 MG tablet Take 5 mg by mouth at bedtime.    . rosuvastatin (CRESTOR) 20 MG tablet Take 1 tablet (20 mg total) by mouth daily. 90 tablet 3  . sulfamethoxazole-trimethoprim (BACTRIM,SEPTRA) 400-80 MG tablet Take 1 tablet by mouth every Monday, Wednesday, and Friday.  6  . tacrolimus (PROGRAF) 0.5 MG capsule Take 0.5 mg by mouth 2 (two) times daily.   2  . tadalafil (CIALIS) 5 MG tablet Take 5 mg by mouth at bedtime.   11   No current facility-administered medications for this encounter.     Allergies  Allergen Reactions  . Contrast Media [Iodinated Diagnostic Agents] Other (See Comments)    Pt states that this medication causes him to code.   Marland Kitchen Penicillins Itching, Rash and Other (See Comments)    Has patient had a PCN reaction causing immediate rash, facial/tongue/throat swelling, SOB  or lightheadedness with hypotension: No Has patient had a PCN reaction causing severe rash involving mucus membranes or skin necrosis: No Has patient had a PCN reaction that required hospitalization No Has patient had a PCN reaction occurring within the last 10 years: No If all of the above answers are "NO", then may proceed with Cephalosporin use.  Marland Kitchen Lisinopril     Increased Cr    Social History   Socioeconomic History  . Marital status: Married    Spouse name: Not on file  . Number of children: Not on file  . Years of education: Not on file  . Highest education level: Not on file  Occupational History  . Not on file  Social Needs  . Financial resource strain: Not on file  . Food insecurity    Worry: Not on file    Inability: Not on file  . Transportation needs    Medical: Not on file    Non-medical: Not on file  Tobacco Use  . Smoking status: Former Smoker    Packs/day: 0.30    Years: 30.00    Pack years: 9.00    Types: Cigarettes    Quit date: 03/15/2012    Years since quitting: 7.6  . Smokeless tobacco: Never Used  Substance and Sexual Activity  . Alcohol  use: Yes    Comment: 04/13/12 "2 mixed drinks/month"  . Drug use: No  . Sexual activity: Yes  Lifestyle  . Physical activity    Days per week: Not on file    Minutes per session: Not on file  . Stress: Not on file  Relationships  . Social Herbalist on phone: Not on file    Gets together: Not on file    Attends religious service: Not on file    Active member of club or organization: Not on file    Attends meetings of clubs or organizations: Not on file    Relationship status: Not on file  . Intimate partner violence    Fear of current or ex partner: Not on file    Emotionally abused: Not on file    Physically abused: Not on file    Forced sexual activity: Not on file  Other Topics Concern  . Not on file  Social History Narrative  . Not on file    Family History  Adopted: Yes  Problem Relation Age of Onset  . Hypertension Other     ROS- All systems are reviewed and negative except as per the HPI above  Physical Exam: Vitals:   11/10/19 1514  BP: 112/70  Pulse: (!) 49  Weight: 97.6 kg  Height: 5\' 8"  (1.727 m)   Wt Readings from Last 3 Encounters:  11/10/19 97.6 kg  07/27/19 90.7 kg  06/30/19 91.8 kg    Labs: Lab Results  Component Value Date   NA 135 07/27/2019   K 4.2 07/27/2019   CL 104 07/27/2019   CO2 24 07/27/2019   GLUCOSE 154 (H) 07/27/2019   BUN 21 (H) 07/27/2019   CREATININE 1.66 (H) 07/27/2019   CALCIUM 9.6 07/27/2019   PHOS 6.9 (H) 04/16/2012   MG 1.7 06/14/2017   Lab Results  Component Value Date   INR 1.07 03/01/2017   No results found for: CHOL, HDL, LDLCALC, TRIG   GEN- The patient is well appearing, alert and oriented x 3 today.   Head- normocephalic, atraumatic Eyes-  Sclera clear, conjunctiva pink Ears- hearing intact Oropharynx- clear Neck- supple, no  JVP Lymph- no cervical lymphadenopathy Lungs- Clear to ausculation bilaterally, normal work of breathing Heart- Regular rate and rhythm, no murmurs, rubs or  gallops, PMI not laterally displaced GI- soft, NT, ND, + BS Extremities- no clubbing, cyanosis, or edema MS- no significant deformity or atrophy Skin- no rash or lesion Psych- euthymic mood, full affect Neuro- strength and sensation are intact  EKG-sinus brady at 49 bpm, LAD,LVH    Assessment and Plan: 1. Atrial fibrillation Maintaining SR Continue amiodarone 200 mg daily  Continue metoprolol 50 mg bid  Cmet/tsh orders given to take to [place] as he is due kidney labs CXR today for pulmonary screening on amiodarone as PFT"s require covid testing as pt would like to defer  He currently denies any cough/shortness of breath   F/u with Dr. Rayann Heman per recall Afib clinic as needed   Butch Penny C. Carroll, [facility] [address] [place], [place] [address] [phone]

## 2019-11-11 DIAGNOSIS — Z940 Kidney transplant status: Secondary | ICD-10-CM | POA: Diagnosis not present

## 2019-11-11 DIAGNOSIS — I129 Hypertensive chronic kidney disease with stage 1 through stage 4 chronic kidney disease, or unspecified chronic kidney disease: Secondary | ICD-10-CM | POA: Diagnosis not present

## 2019-11-16 MED FILL — TACROLIMUS ANHYDROUS 0.5MG: 0.5 | 30 days supply | Qty: 60 | Fill #0

## 2019-11-16 MED FILL — TACROLIMUS ANHYDROUS 0.5MG: 0.5 | 30 days supply | Qty: 60 | Fill #0 | Status: TO

## 2019-11-25 MED FILL — ROSUVASTATIN CALCIUM 20 MG: 20 | 90 days supply | Qty: 90 | Fill #1

## 2019-11-25 MED FILL — TADALAFIL 5 MG TABS: 5 | 30 days supply | Qty: 30 | Fill #6

## 2019-11-25 MED FILL — VASCEPA 1 GM CAPSULE: 1 | 30 days supply | Qty: 120 | Fill #6

## 2019-11-25 MED FILL — OMEPRAZOLE 20 MG CAPSULE DR: 20 | 30 days supply | Qty: 30 | Fill #3

## 2019-11-29 ENCOUNTER — Other Ambulatory Visit: Payer: Self-pay | Admitting: Internal Medicine

## 2019-11-29 DIAGNOSIS — R079 Chest pain, unspecified: Secondary | ICD-10-CM

## 2019-11-29 MED FILL — ELIQUIS 5 MG TABLET: 5 | 30 days supply | Qty: 60 | Fill #0

## 2019-12-01 DIAGNOSIS — Z940 Kidney transplant status: Secondary | ICD-10-CM | POA: Diagnosis not present

## 2019-12-09 MED FILL — MYCOPHENOLIC ACID DR 180 MG: 180 | 30 days supply | Qty: 240 | Fill #4

## 2019-12-09 MED FILL — predniSONE 5 MG TABS: 5 | 30 days supply | Qty: 30 | Fill #2

## 2019-12-09 MED FILL — ALLOPURINOL 100 MG TABS: 100 | 30 days supply | Qty: 60 | Fill #2

## 2019-12-14 MED FILL — FENOFIBRATE 134 MG CAPSULE: 134 | 30 days supply | Qty: 30 | Fill #0

## 2019-12-15 DIAGNOSIS — B351 Tinea unguium: Secondary | ICD-10-CM | POA: Diagnosis not present

## 2019-12-15 DIAGNOSIS — B353 Tinea pedis: Secondary | ICD-10-CM | POA: Diagnosis not present

## 2019-12-16 MED FILL — SULFAMETHOXAZOLE-TMP SS TAB: 400-80 | 28 days supply | Qty: 12 | Fill #4

## 2019-12-22 MED FILL — OMEPRAZOLE DR 20 MG CAPSULE: 20 | 30 days supply | Qty: 30 | Fill #4

## 2019-12-22 MED FILL — VASCEPA 1 GM CAPSULE: 1 | 30 days supply | Qty: 120 | Fill #7

## 2019-12-23 MED FILL — TACROLIMUS ANHYDROUS 0.5MG: 0.5 | 30 days supply | Qty: 60 | Fill #1

## 2019-12-30 ENCOUNTER — Other Ambulatory Visit: Payer: Self-pay | Admitting: Internal Medicine

## 2019-12-30 DIAGNOSIS — N5201 Erectile dysfunction due to arterial insufficiency: Secondary | ICD-10-CM | POA: Diagnosis not present

## 2019-12-30 DIAGNOSIS — N400 Enlarged prostate without lower urinary tract symptoms: Secondary | ICD-10-CM | POA: Diagnosis not present

## 2019-12-30 MED FILL — ELIQUIS 5 MG TABLET: 5 | 30 days supply | Qty: 60 | Fill #1

## 2019-12-30 MED FILL — TADALAFIL 5 MG TABS: 5 | 90 days supply | Qty: 90 | Fill #0

## 2020-01-04 DIAGNOSIS — I4892 Unspecified atrial flutter: Secondary | ICD-10-CM | POA: Diagnosis not present

## 2020-01-04 DIAGNOSIS — D751 Secondary polycythemia: Secondary | ICD-10-CM | POA: Diagnosis not present

## 2020-01-04 DIAGNOSIS — I129 Hypertensive chronic kidney disease with stage 1 through stage 4 chronic kidney disease, or unspecified chronic kidney disease: Secondary | ICD-10-CM | POA: Diagnosis not present

## 2020-01-04 DIAGNOSIS — I2510 Atherosclerotic heart disease of native coronary artery without angina pectoris: Secondary | ICD-10-CM | POA: Diagnosis not present

## 2020-01-04 DIAGNOSIS — N400 Enlarged prostate without lower urinary tract symptoms: Secondary | ICD-10-CM | POA: Diagnosis not present

## 2020-01-04 DIAGNOSIS — Z940 Kidney transplant status: Secondary | ICD-10-CM | POA: Diagnosis not present

## 2020-01-04 DIAGNOSIS — E785 Hyperlipidemia, unspecified: Secondary | ICD-10-CM | POA: Diagnosis not present

## 2020-01-04 DIAGNOSIS — I4891 Unspecified atrial fibrillation: Secondary | ICD-10-CM | POA: Diagnosis not present

## 2020-01-13 ENCOUNTER — Other Ambulatory Visit: Payer: Self-pay | Admitting: Internal Medicine

## 2020-01-13 MED FILL — predniSONE 5 MG TABS: 5 | 30 days supply | Qty: 30 | Fill #3

## 2020-01-13 MED FILL — ALLOPURINOL 100 MG TABS: 100 | 30 days supply | Qty: 60 | Fill #3

## 2020-01-13 MED FILL — SULFAMETHOXAZOLE-TMP SS TAB: 400-80 | 28 days supply | Qty: 12 | Fill #5

## 2020-01-13 MED FILL — FENOFIBRATE 134 MG CAPSULE: 134 | 30 days supply | Qty: 30 | Fill #1

## 2020-01-13 MED FILL — AMIODARONE HCL 200 MG TAB: 200 | 90 days supply | Qty: 180 | Fill #0

## 2020-01-13 MED FILL — MYCOPHENOLIC ACID DR 180 MG: 180 | 30 days supply | Qty: 240 | Fill #5

## 2020-01-20 MED FILL — CloNIDine HCL 0.1 MG TAB: 0.1 | 90 days supply | Qty: 90 | Fill #2

## 2020-01-20 MED FILL — METOPROLOL TARTRATE 50 MG T: 50 | 90 days supply | Qty: 180 | Fill #2

## 2020-01-25 ENCOUNTER — Other Ambulatory Visit: Payer: Self-pay | Admitting: Internal Medicine

## 2020-01-25 MED FILL — OMEPRAZOLE DR 20 MG CAPSULE: 20 | 30 days supply | Qty: 30 | Fill #5

## 2020-01-25 MED FILL — VASCEPA 1 GM CAPSULE: 1 | 30 days supply | Qty: 120 | Fill #0

## 2020-01-25 MED FILL — TACROLIMUS ANHYDROUS 0.5MG: 0.5 | 30 days supply | Qty: 60 | Fill #2

## 2020-02-02 MED FILL — ELIQUIS 5 MG TABLET: 5 | 30 days supply | Qty: 60 | Fill #2

## 2020-02-09 MED FILL — FENOFIBRATE 134 MG CAPSULE: 134 | 30 days supply | Qty: 30 | Fill #2

## 2020-02-09 MED FILL — SULFAMETHOXAZOLE-TMP SS TAB: 400-80 | 28 days supply | Qty: 12 | Fill #6

## 2020-02-09 MED FILL — predniSONE 5 MG TABS: 5 | 30 days supply | Qty: 30 | Fill #4

## 2020-02-09 MED FILL — ALLOPURINOL 100 MG TABS: 100 | 30 days supply | Qty: 60 | Fill #4

## 2020-02-15 ENCOUNTER — Other Ambulatory Visit: Payer: Self-pay

## 2020-02-15 ENCOUNTER — Encounter: Payer: Self-pay | Admitting: Internal Medicine

## 2020-02-15 ENCOUNTER — Ambulatory Visit (INDEPENDENT_AMBULATORY_CARE_PROVIDER_SITE_OTHER): Payer: 59 | Admitting: Internal Medicine

## 2020-02-15 VITALS — BP 130/90 | HR 96 | Ht 68.0 in | Wt 217.0 lb

## 2020-02-15 DIAGNOSIS — Z01812 Encounter for preprocedural laboratory examination: Secondary | ICD-10-CM

## 2020-02-15 DIAGNOSIS — I484 Atypical atrial flutter: Secondary | ICD-10-CM

## 2020-02-15 NOTE — Progress Notes (Signed)
.    A serie  Patient Care Team: Patient, No Pcp Per as PCP - General (General Practice)   HPI  [name] is a 59 y.o. male Seen in followup for concerns about atrial fibrillation in the context of significant left atrial enlargement prior atrial flutter for which he underwent catheter ablation 2013 and left ventricular hypertrophy. Prob HCM  Ischemic heart disease with prior bypass surgery 4/13 and had post CABG atrial fibrillation.   Atypical and typical flutter but amplitudes particularly in lead V1 which are very atypical.  He saw Dr. Greggory Brandy and underwent catheter ablation for atrial flutter as well as atrial fibrillation.  9/18.. Atrial flutter was noninducible.  Pulmonary vein isolation was accomplished.  Multiple PACs.  Further repeat ablation was not recommended   Now maintained on  amio  Has hx of remote stroke   In December he noted the abrupt onset of a change in his status.  His heart rate went from being in the 50s to the 110s.  Deterioration in exercise tolerance.  Fatigue.  Started taking extra amiodarone.  DATE TEST EF   9/15 Echo   55-65 % LVH severe LAE (53/2.4)  7/16 cMRI   ASH Mid--apical  cavitary obliteration  No contrast 2/2 renal disease  9/18 TEE 65% HCM         Date Cr K TSH LFTs Hgb  9/17 1.5 4.6      10/19 1.5 4.1   17  5/20 1.26 4.7   16.1               He has resting bradycardia  Past Medical History:  Diagnosis Date  . Arteriovenous fistula, acquired ([place])   . Atrial fibrillation and flutter ([place])   . Bladder stones   . BMI 31.0-31.9,adult   . CAD (coronary artery disease)    a. s/p CABG 03/2012: LIMA to LAD, free RIMA to OM1, SVG to D1, sequential SVG to AM and LPLB2, EVH via right thigh and leg.  . Cellulitis 04/13/2012   RLE saphenous vein harvest incision   . CKD (chronic kidney disease)   . Dyslipidemia   . Gout   . History of kidney transplant   . Hyperglycemia   . Hyperlipidemia   . Hyperparathyroidism   . Hypertension     . Hypertrophic cardiomyopathy ([place])   . Hypomagnesemia   . Immunosuppression ([place])   . Intermittent palpitations   . Microscopic hematuria   . Migraines   . PAF and Flutter    a. Afib post-op CABG; AFlutter 10/2012  . Peripheral vascular disease ([place])   . Post-transplant erythrocytosis   . Renal disease   . S/P living-donor kidney transplantation   . Sinus node dysfunction-post termination pause    a. >8sec in setting of aflutter 10/2012  . Sleep pattern disturbance   . Stroke [facility]) 1995; 2001   denies residual  . Superficial vein thrombosis    a. R greater saphenous 04/2012  . Vitamin D deficiency   . VT (ventricular tachycardia) ([place])     Past Surgical History:  Procedure Laterality Date  . ATRIAL FIBRILLATION ABLATION N/A 08/21/2017   Procedure: Atrial Fibrillation Ablation;  Surgeon: [name], MD;  Location: [place] CV LAB;  Service: Cardiovascular;  Laterality: N/A;  . ATRIAL FLUTTER ABLATION N/A 11/09/2012   Procedure: ATRIAL FLUTTER ABLATION;  Surgeon: [name], MD;  Location: [facility] CATH LAB;  Service: Cardiovascular;  Laterality: N/A;  . AV FISTULA PLACEMENT  2011  left forearm  . AV FISTULA PLACEMENT    . CARDIAC CATHETERIZATION  03/16/12  . CARDIOVERSION N/A 06/10/2017   Procedure: CARDIOVERSION;  Surgeon: Pixie Casino, MD;  Location: [facility] ENDOSCOPY;  Service: Cardiovascular;  Laterality: N/A;  . CARDIOVERSION N/A 10/13/2017   Procedure: CARDIOVERSION;  Surgeon: Dorothy Spark, MD;  Location: [facility] ENDOSCOPY;  Service: Cardiovascular;  Laterality: N/A;  . CARDIOVERSION N/A 10/08/2018   Procedure: CARDIOVERSION;  Surgeon: Larey Dresser, MD;  Location: [facility] ENDOSCOPY;  Service: Cardiovascular;  Laterality: N/A;  . CARDIOVERSION N/A 04/27/2019   Procedure: CARDIOVERSION;  Surgeon: Dorothy Spark, MD;  Location: [place];  Service: Cardiovascular;  Laterality: N/A;  . CORONARY ARTERY BYPASS GRAFT  03/19/2012   Procedure: CORONARY ARTERY BYPASS  GRAFTING (CABG);  Surgeon: Rexene Alberts, MD;  Location: [place];  Service: Open Heart Surgery;  Laterality: N/A;  (B) MAMMARY  . CYSTOSCOPY WITH BIOPSY N/A 08/15/2016   Procedure: CYSTOSCOPY WITH IDENTIFICATION OF RIGHT TRANSPLANTATION OF URETRAL ORFICE WITH FLUORESCEIN;  Surgeon: Carolan Clines, MD;  [facility];  Service: Urology;  Laterality: N/A;  . DENTAL SURGERY  2008   multiple  . [place]   left  . FRACTURE SURGERY    . HERNIA REPAIR  [id number]   umbilical  . HOLMIUM LASER APPLICATION N/A [id number]   Procedure: HOLMIUM LASER APPLICATION WITH 3 CM RIGHT BLADDER STONE;  Surgeon: Carolan Clines, MD;  [facility];  Service: Urology;  Laterality: N/A;  . INGUINAL HERNIA REPAIR  09/2009   bilaterally  . KIDNEY TRANSPLANT    . LEFT HEART CATH AND CORS/GRAFTS ANGIOGRAPHY N/A 07/27/2019   Procedure: LEFT HEART CATH AND CORS/GRAFTS ANGIOGRAPHY;  Surgeon: Sherren Mocha, MD;  Location: [place] CV LAB;  Service: Cardiovascular;  Laterality: N/A;  . LEFT HEART CATHETERIZATION WITH CORONARY ANGIOGRAM N/A 03/16/2012   Procedure: LEFT HEART CATHETERIZATION WITH CORONARY ANGIOGRAM;  Surgeon: Sherren Mocha, MD;  Location: [facility] CATH LAB;  Service: Cardiovascular;  Laterality: N/A;  . RENAL ARTERY STENT  2001  . TEE WITHOUT CARDIOVERSION N/A 08/21/2017   Procedure: TRANSESOPHAGEAL ECHOCARDIOGRAM (TEE);  Surgeon: Larey Dresser, MD;  Location: [facility] ENDOSCOPY;  Service: Cardiovascular;  Laterality: N/A;  . TRANSURETHRAL RESECTION OF BLADDER TUMOR N/A 08/15/2016   Procedure: TRANSURETHRAL BIOPSY OF RIGHT BLADDER WALL;  Surgeon: Carolan Clines, MD;  [facility];  Service: Urology;  Laterality: N/A;    Current Outpatient Medications  Medication Sig Dispense Refill  . allopurinol (ZYLOPRIM) 100 MG tablet Take 200 mg by mouth daily.     Marland Kitchen amiodarone (PACERONE) 200 MG tablet TAKE 1 TABLET BY MOUTH TWICE A DAY 180 tablet 1  . cloNIDine (CATAPRES) 0.1 MG tablet  Take 1 tablet (0.1 mg total) by mouth daily. 90 tablet 3  . diltiazem (CARDIZEM) 60 MG tablet Take 1 tablet as needed every 6 hours for rapid afib HR over 100. 45 tablet 1  . ELIQUIS 5 MG TABS tablet TAKE 1 TABLET BY MOUTH TWICE A DAY 60 tablet 5  . fenofibrate micronized (LOFIBRA) 134 MG capsule Take 134 mg by mouth daily.     . magnesium oxide (MAG-OX) 400 (241.3 Mg) MG tablet Take 400 mg by mouth 2 (two) times daily.    . metoprolol tartrate (LOPRESSOR) 50 MG tablet Take 1 tablet (50 mg total) by mouth 2 (two) times daily. 180 tablet 3  . mycophenolate (MYFORTIC) 180 MG EC tablet Take 720 mg by mouth 2 (two) times daily.     Marland Kitchen  omeprazole (PRILOSEC) 20 MG capsule Take 20 mg by mouth daily.  5  . predniSONE (DELTASONE) 5 MG tablet Take 5 mg by mouth at bedtime.    . rosuvastatin (CRESTOR) 20 MG tablet Take 1 tablet (20 mg total) by mouth daily. 90 tablet 3  . sulfamethoxazole-trimethoprim (BACTRIM,SEPTRA) 400-80 MG tablet Take 1 tablet by mouth every Monday, Wednesday, and Friday.  6  . tacrolimus (PROGRAF) 0.5 MG capsule Take 0.5 mg by mouth 2 (two) times daily.   2  . tadalafil (CIALIS) 5 MG tablet Take 5 mg by mouth at bedtime.   11  . VASCEPA 1 g capsule TAKE 2 CAPSULES BY MOUTH 2 TIMES DAILY. 120 capsule 4   No current facility-administered medications for this visit.    Allergies  Allergen Reactions  . Contrast Media [Iodinated Diagnostic Agents] Other (See Comments)    Pt states that this medication causes him to code.   Marland Kitchen Penicillins Itching, Rash and Other (See Comments)    Has patient had a PCN reaction causing immediate rash, facial/tongue/throat swelling, SOB or lightheadedness with hypotension: No Has patient had a PCN reaction causing severe rash involving mucus membranes or skin necrosis: No Has patient had a PCN reaction that required hospitalization No Has patient had a PCN reaction occurring within the last 10 years: No If all of the above answers are "NO", then may  proceed with Cephalosporin use.  Marland Kitchen Lisinopril     Increased Cr      Review of Systems negative except from HPI and PMH  BP 130/90   Pulse 96   Ht 5\' 8"  (1.727 m)   Wt 217 lb (98.4 kg)   SpO2 97%   BMI 32.99 kg/m  Well developed and nourished in no acute distress HENT normal Neck supple with JVP-7 cm Clear Rapid but regular rate and rhythm, no murmurs or gallops Abd-soft with active BS No Clubbing cyanosis edema Skin-warm and dry A & Oriented  Grossly normal sensory and motor function  ECG atrial flutter with an atrial cycle length 280 ms with 2: 1 conduction  Assessment and  Plan   Atrial Flutter  S/p RFCA-- recurrent and atypical s/p ablation 2018-JA  Freq PACs  LAE  LVH-Asymmetric 18/11  HCM    CAD s/p CABG 2013  Sleep disordered breathing   Sinus bradycardia  Status post kidney transplant     Recurrent atrial flutter.  We will submit for cardioversion.  Reduce amiodarone back to 200 mg a day.  Surveillance laboratories are normal within the last 3 months.  Discussed the role of considering an a another ablation attempt, not withstanding Dr. Jackalyn Lombard reluctance.  I will reach out to Dr. Noralee Stain about consultation for repeat ablation attempt primarily of his flutter circuits.  He has had interval atrial fibrillation detected on his AliveCor but it has not been sustaining the same  wauy

## 2020-02-15 NOTE — Patient Instructions (Addendum)
Medication Instructions:  Your physician recommends that you continue on your current medications as directed. Please refer to the Current Medication list given to you today.  Decrease your Amiodarone to 200mg  1 tablet by mouth daily after your Cardioversion.  Labwork: CBC and BMET today  Testing/Procedures: Cardioversion Your physician has recommended that you have a Cardioversion (DCCV). Electrical Cardioversion uses a jolt of electricity to your heart either through paddles or wired patches attached to your chest. This is a controlled, usually prescheduled, procedure. Defibrillation is done under light anesthesia in the hospital, and you usually go home the day of the procedure. This is done to get your heart back into a normal rhythm. You are not awake for the procedure. Please see the instruction sheet given to you today.  Follow-Up: Your physician wants you to follow-up in: 4 weeks with Dr Caryl Comes.  His scheduler will call you to schedule appointment.  Remote monitoring is used to monitor your Pacemaker of ICD from home. This monitoring reduces the number of office visits required to check your device to one time per year. It allows [place] to keep an eye on the functioning of your device to ensure it is working properly.  Any Other Special Instructions Will Be Listed Below (If Applicable).     If you need a refill on your cardiac medications before your next appointment, please call your pharmacy.   DearMr Haley,  You are scheduled for a Cardioversion on Thursday 02/24/2020 at 70am  with Dr. Gardiner Rhyme.  Please arrive at the [facility] (Main Entrance A) at [facility]: [address] [place], [place] [address] at 630am   DIET: Nothing to eat or drink after midnight except a sip of water with medications (see medication instructions below)  Medication Instructions: Continue your anticoagulant: Eliquis You will need to continue your anticoagulant after your procedure until you   are told by your  Provider that it is safe to stop  Your Pre-procedure COVID-19 Testing will be done on Monday 02/21/2020 at 1225pm at [facility] at [id number], [place], [place] [address]. Once you arrive at the testing site, stay in the right hand lane, go under the building overhang not the tent. If you are tested under the tent your results may not be back before your procedure. Please be on time for your appointment.  After your swab you will be given a mask to wear and instructed to go home and quarantine/no visitors until after your procedure. If you test positive you will be notified and your procedure will be cancelled.     You must have a responsible person to drive you home and stay in the waiting area during your procedure. Failure to do so could result in cancellation.  Bring your insurance cards.  *Special Note: Every effort is made to have your procedure done on time. Occasionally there are emergencies that occur at the hospital that may cause delays. Please be patient if a delay does occur.

## 2020-02-16 LAB — CBC
Hematocrit: 52.6 % — ABNORMAL HIGH (ref 37.5–51.0)
Hemoglobin: 17.4 g/dL (ref 13.0–17.7)
MCH: 29.8 pg (ref 26.6–33.0)
MCHC: 33.1 g/dL (ref 31.5–35.7)
MCV: 90 fL (ref 79–97)
Platelets: 156 10*3/uL (ref 150–450)
RBC: 5.84 x10E6/uL — ABNORMAL HIGH (ref 4.14–5.80)
RDW: 13.7 % (ref 11.6–15.4)
WBC: 7.1 10*3/uL (ref 3.4–10.8)

## 2020-02-16 LAB — BASIC METABOLIC PANEL
BUN/Creatinine Ratio: 13 (ref 9–20)
BUN: 21 mg/dL (ref 6–24)
CO2: 23 mmol/L (ref 20–29)
Calcium: 9.7 mg/dL (ref 8.7–10.2)
Chloride: 105 mmol/L (ref 96–106)
Creatinine, Ser: 1.56 mg/dL — ABNORMAL HIGH (ref 0.76–1.27)
GFR calc Af Amer: 57 mL/min/{1.73_m2} — ABNORMAL LOW (ref >59)
GFR calc non Af Amer: 49 mL/min/{1.73_m2} — ABNORMAL LOW (ref >59)
Glucose: 98 mg/dL (ref 65–99)
Potassium: 4.6 mmol/L (ref 3.5–5.2)
Sodium: 140 mmol/L (ref 134–144)

## 2020-02-16 MED FILL — MYCOPHENOLIC ACID DR 180 MG: 180 | 30 days supply | Qty: 240 | Fill #0

## 2020-02-21 ENCOUNTER — Inpatient Hospital Stay (HOSPITAL_COMMUNITY): Admission: RE | Admit: 2020-02-21 | Payer: 59 | Source: Ambulatory Visit

## 2020-02-21 ENCOUNTER — Other Ambulatory Visit (HOSPITAL_COMMUNITY): Payer: 59

## 2020-02-23 ENCOUNTER — Other Ambulatory Visit (HOSPITAL_COMMUNITY)
Admission: RE | Admit: 2020-02-23 | Discharge: 2020-02-23 | Disposition: A | Discharge status: Home / Self Care | Payer: 59 | Source: Ambulatory Visit | Attending: Cardiology | Admitting: Cardiology

## 2020-02-23 DIAGNOSIS — Z20822 Contact with and (suspected) exposure to covid-19: Secondary | ICD-10-CM | POA: Insufficient documentation

## 2020-02-23 DIAGNOSIS — Z01812 Encounter for preprocedural laboratory examination: Secondary | ICD-10-CM | POA: Diagnosis not present

## 2020-02-23 LAB — SARS CORONAVIRUS 2 (TAT 6-24 HRS): SARS Coronavirus 2: NEGATIVE

## 2020-02-23 MED FILL — ROSUVASTATIN CALCIUM 20 MG: 20 | 90 days supply | Qty: 90 | Fill #2

## 2020-02-23 MED FILL — VASCEPA 1 GM CAPSULE: 1 | 30 days supply | Qty: 120 | Fill #1

## 2020-02-23 MED FILL — TACROLIMUS ANHYDROUS 0.5MG: 0.5 | 30 days supply | Qty: 60 | Fill #3

## 2020-02-23 NOTE — Anesthesia Preprocedure Evaluation (Addendum)
Anesthesia Evaluation  Patient identified by MRN, date of birth, ID band Patient awake    Reviewed: Allergy & Precautions, NPO status , Patient's Chart, lab work & pertinent test results  Airway Mallampati: II  TM Distance: >3 FB Neck ROM: Full    Dental no notable dental hx. (+) Lower Dentures, Upper Dentures   Pulmonary Patient abstained from smoking., former smoker,    Pulmonary exam normal breath sounds clear to auscultation       Cardiovascular hypertension, + CAD and + Peripheral Vascular Disease  Normal cardiovascular exam+ dysrhythmias Atrial Fibrillation  Rhythm:Irregular Rate:Normal  EF 60%   Neuro/Psych  Headaches, CVA, No Residual Symptoms negative psych ROS   GI/Hepatic negative GI ROS, Neg liver ROS,   Endo/Other  negative endocrine ROS  Renal/GU Renal InsufficiencyS/P renal Transplant  K+ 4.6 Cr 1.56     Musculoskeletal negative musculoskeletal ROS (+)   Abdominal   Peds  Hematology Hgb 17.4 Plt 156   Anesthesia Other Findings   Reproductive/Obstetrics negative OB ROS                           Anesthesia Physical Anesthesia Plan  ASA: III  Anesthesia Plan: General   Post-op Pain Management:    Induction:   PONV Risk Score and Plan: Treatment may vary due to age or medical condition  Airway Management Planned: Nasal Cannula and Natural Airway  Additional Equipment: None  Intra-op Plan:   Post-operative Plan:   Informed Consent: I have reviewed the patients History and Physical, chart, labs and discussed the procedure including the risks, benefits and alternatives for the proposed anesthesia with the patient or authorized representative who has indicated his/her understanding and acceptance.     Dental advisory given  Plan Discussed with: CRNA  Anesthesia Plan Comments:        Anesthesia Quick Evaluation

## 2020-02-24 ENCOUNTER — Ambulatory Visit (HOSPITAL_COMMUNITY): Payer: 59 | Admitting: Anesthesiology

## 2020-02-24 ENCOUNTER — Ambulatory Visit (HOSPITAL_COMMUNITY)
Admission: RE | Admit: 2020-02-24 | Discharge: 2020-02-24 | Disposition: A | Discharge status: Home / Self Care | Payer: 59 | Attending: Cardiology | Admitting: Cardiology

## 2020-02-24 ENCOUNTER — Other Ambulatory Visit: Payer: Self-pay

## 2020-02-24 ENCOUNTER — Encounter (HOSPITAL_COMMUNITY)
Admission: RE | Disposition: A | Discharge status: Home / Self Care | Payer: Self-pay | Source: Home / Self Care | Attending: Cardiology

## 2020-02-24 DIAGNOSIS — M109 Gout, unspecified: Secondary | ICD-10-CM | POA: Insufficient documentation

## 2020-02-24 DIAGNOSIS — I10 Essential (primary) hypertension: Secondary | ICD-10-CM | POA: Diagnosis not present

## 2020-02-24 DIAGNOSIS — Z79899 Other long term (current) drug therapy: Secondary | ICD-10-CM | POA: Diagnosis not present

## 2020-02-24 DIAGNOSIS — E213 Hyperparathyroidism, unspecified: Secondary | ICD-10-CM | POA: Diagnosis not present

## 2020-02-24 DIAGNOSIS — Z888 Allergy status to other drugs, medicaments and biological substances status: Secondary | ICD-10-CM | POA: Diagnosis not present

## 2020-02-24 DIAGNOSIS — I2510 Atherosclerotic heart disease of native coronary artery without angina pectoris: Secondary | ICD-10-CM | POA: Insufficient documentation

## 2020-02-24 DIAGNOSIS — Z86718 Personal history of other venous thrombosis and embolism: Secondary | ICD-10-CM | POA: Insufficient documentation

## 2020-02-24 DIAGNOSIS — I4892 Unspecified atrial flutter: Secondary | ICD-10-CM | POA: Diagnosis not present

## 2020-02-24 DIAGNOSIS — Z7901 Long term (current) use of anticoagulants: Secondary | ICD-10-CM | POA: Insufficient documentation

## 2020-02-24 DIAGNOSIS — I422 Other hypertrophic cardiomyopathy: Secondary | ICD-10-CM | POA: Insufficient documentation

## 2020-02-24 DIAGNOSIS — I129 Hypertensive chronic kidney disease with stage 1 through stage 4 chronic kidney disease, or unspecified chronic kidney disease: Secondary | ICD-10-CM | POA: Insufficient documentation

## 2020-02-24 DIAGNOSIS — Z880 Allergy status to penicillin: Secondary | ICD-10-CM | POA: Insufficient documentation

## 2020-02-24 DIAGNOSIS — Z8673 Personal history of transient ischemic attack (TIA), and cerebral infarction without residual deficits: Secondary | ICD-10-CM | POA: Insufficient documentation

## 2020-02-24 DIAGNOSIS — N189 Chronic kidney disease, unspecified: Secondary | ICD-10-CM | POA: Diagnosis not present

## 2020-02-24 DIAGNOSIS — Z951 Presence of aortocoronary bypass graft: Secondary | ICD-10-CM | POA: Diagnosis not present

## 2020-02-24 DIAGNOSIS — I4891 Unspecified atrial fibrillation: Secondary | ICD-10-CM | POA: Diagnosis not present

## 2020-02-24 DIAGNOSIS — G4730 Sleep apnea, unspecified: Secondary | ICD-10-CM | POA: Insufficient documentation

## 2020-02-24 DIAGNOSIS — E785 Hyperlipidemia, unspecified: Secondary | ICD-10-CM | POA: Insufficient documentation

## 2020-02-24 DIAGNOSIS — Z940 Kidney transplant status: Secondary | ICD-10-CM | POA: Diagnosis not present

## 2020-02-24 DIAGNOSIS — I484 Atypical atrial flutter: Secondary | ICD-10-CM | POA: Diagnosis not present

## 2020-02-24 DIAGNOSIS — I259 Chronic ischemic heart disease, unspecified: Secondary | ICD-10-CM | POA: Diagnosis not present

## 2020-02-24 DIAGNOSIS — R001 Bradycardia, unspecified: Secondary | ICD-10-CM | POA: Diagnosis not present

## 2020-02-24 DIAGNOSIS — I739 Peripheral vascular disease, unspecified: Secondary | ICD-10-CM | POA: Diagnosis not present

## 2020-02-24 DIAGNOSIS — Z7952 Long term (current) use of systemic steroids: Secondary | ICD-10-CM | POA: Diagnosis not present

## 2020-02-24 HISTORY — PX: CARDIOVERSION: SHX1299

## 2020-02-24 SURGERY — CARDIOVERSION
Anesthesia: General

## 2020-02-24 MED ORDER — LIDOCAINE HCL (CARDIAC) PF 100 MG/5ML IV SOSY
PREFILLED_SYRINGE | INTRAVENOUS | Status: DC | PRN
  Administered 2020-02-24: 80 mg via INTRAVENOUS

## 2020-02-24 MED ORDER — SODIUM CHLORIDE 0.9 % IV SOLN: INTRAVENOUS | Status: DC

## 2020-02-24 MED ORDER — PROPOFOL 10 MG/ML IV BOLUS
INTRAVENOUS | Status: DC | PRN
  Administered 2020-02-24: 100 mg via INTRAVENOUS

## 2020-02-24 NOTE — Anesthesia Procedure Notes (Signed)
Procedure Name: General with mask airway Date/Time: 02/24/2020 7:51 AM Performed by: Eligha Bridegroom, CRNA Pre-anesthesia Checklist: Patient identified, Emergency Drugs available, Suction available and Patient being monitored Patient Re-evaluated:Patient Re-evaluated prior to induction Oxygen Delivery Method: Circle system utilized and Ambu bag Preoxygenation: Pre-oxygenation with 100% oxygen Induction Type: IV induction Ventilation: Mask ventilation without difficulty

## 2020-02-24 NOTE — H&P (Signed)
History and Physical Interval Note:  02/24/2020 7:42 AM  [name]  has presented today for surgery, with the diagnosis of A-FLUTTER.  The various methods of treatment have been discussed with the patient and family. After consideration of risks, benefits and other options for treatment, the patient has consented to  Procedure(s): CARDIOVERSION (N/A) as a surgical intervention.  The patient's history has been reviewed, patient examined, no change in status, stable for surgery.  I have reviewed the patient's chart and labs.  Questions were answered to the patient's satisfaction.     Steven Harold, MD  Physician  Cardiology  Progress Notes    Sign when Signing Visit  Encounter Date:  02/15/2020          Sign when Signing Visit         Show:Clear all [x] Manual[x] Template[x] Copied  Added by: [x] Steven Sprang, MD  [] Hover for details .    A serie  Patient Care Team: Patient, No Pcp Per as PCP - General (General Practice)             HPI  [name] is a 59 y.o. male  Seen in followup for concerns about atrial fibrillation in the context of significant left atrial enlargement prior atrial flutter for which he underwent catheter ablation 2013 and left ventricular hypertrophy. Prob HCM  Ischemic heart disease with prior bypass surgery 4/13 and had post CABG atrial fibrillation.  Atypical and typical flutter but amplitudes particularly in lead V1 which are very atypical. He saw Dr. Greggory Haley and underwent catheter ablation for atrial flutter as well as atrial fibrillation. 9/18.. Atrial flutter was noninducible. Pulmonary vein isolation was accomplished. Multiple PACs. Further repeat ablation was not recommended  Now maintained on amio  Has hx of remote stroke  In December he noted the abrupt onset of a change in his status. His heart rate went from being in the 50s to the 110s. Deterioration in exercise tolerance. Fatigue. Started  taking extra amiodarone.  DATE TEST EF   9/15 Echo  55-65 % LVH severe  LAE (53/2.4)  7/16 cMRI  ASH  Mid--apical cavitary obliteration  No contrast 2/2 renal disease  9/18 TEE 65% HCM         Date Cr K TSH LFTs Hgb  9/17 1.5 4.6     10/19 1.5 4.1   17  5/20 1.26 4.7   16.1         He has resting bradycardia      Past Medical History:  Diagnosis Date  . Arteriovenous fistula, acquired ([place])   . Atrial fibrillation and flutter ([place])   . Bladder stones   . BMI 31.0-31.9,adult   . CAD (coronary artery disease)    a. s/p CABG 03/2012: LIMA to LAD, free RIMA to OM1, SVG to D1, sequential SVG to AM and LPLB2, EVH via right thigh and leg.  . Cellulitis 04/13/2012   RLE saphenous vein harvest incision   . CKD (chronic kidney disease)   . Dyslipidemia   . Gout   . History of kidney transplant   . Hyperglycemia   . Hyperlipidemia   . Hyperparathyroidism   . Hypertension   . Hypertrophic cardiomyopathy ([place])   . Hypomagnesemia   . Immunosuppression ([place])   . Intermittent palpitations   . Microscopic hematuria   . Migraines   . PAF and Flutter    a. Afib post-op CABG; AFlutter 10/2012  . Peripheral vascular disease ([place])   .  Post-transplant erythrocytosis   . Renal disease   . S/P living-donor kidney transplantation   . Sinus node dysfunction-post termination pause    a. >8sec in setting of aflutter 10/2012  . Sleep pattern disturbance   . Stroke [facility]) 1995; 2001   denies residual  . Superficial vein thrombosis    a. R greater saphenous 04/2012  . Vitamin D deficiency   . VT (ventricular tachycardia) ([place])         Past Surgical History:  Procedure Laterality Date  . ATRIAL FIBRILLATION ABLATION N/A 08/21/2017   Procedure: Atrial Fibrillation Ablation; Surgeon: Steven Grayer, MD; Location: [place] CV LAB; Service: Cardiovascular; Laterality: N/A;  . ATRIAL FLUTTER ABLATION N/A 11/09/2012   Procedure: ATRIAL FLUTTER ABLATION; Surgeon: Steven Sprang, MD; Location: [facility]  CATH LAB; Service: Cardiovascular; Laterality: N/A;  . AV FISTULA PLACEMENT  2011   left forearm  . AV FISTULA PLACEMENT    . CARDIAC CATHETERIZATION  03/16/12  . CARDIOVERSION N/A 06/10/2017   Procedure: CARDIOVERSION; Surgeon: Steven Casino, MD; Location[facility] ENDOSCOPY; Service: Cardiovascular; Laterality: N/A;  . CARDIOVERSION N/A 10/13/2017   Procedure: CARDIOVERSION; Surgeon: Steven Spark, MD; Location: [facility] ENDOSCOPY; Service: Cardiovascular; Laterality: N/A;  . CARDIOVERSION N/A 10/08/2018   Procedure: CARDIOVERSION; Surgeon: Steven Dresser, MD; Location: [facility] ENDOSCOPY; Service: Cardiovascular; Laterality: N/A;  . CARDIOVERSION N/A 04/27/2019   Procedure: CARDIOVERSION; Surgeon: Steven Spark, MD; Location: [place]; Service: Cardiovascular; Laterality: N/A;  . CORONARY ARTERY BYPASS GRAFT  03/19/2012   Procedure: CORONARY ARTERY BYPASS GRAFTING (CABG); Surgeon: Steven Alberts, MD; Location: [place]; Service: Open Heart Surgery; Laterality: N/A; (B) MAMMARY  . CYSTOSCOPY WITH BIOPSY N/A 08/15/2016   Procedure: CYSTOSCOPY WITH IDENTIFICATION OF RIGHT TRANSPLANTATION OF URETRAL ORFICE WITH FLUORESCEIN; Surgeon: Steven Clines, MD; Location: [facility] ORS; Service: Urology; Laterality: N/A;  . DENTAL SURGERY  2008   multiple  . [place]   left  . FRACTURE SURGERY    . HERNIA REPAIR  [id number]   umbilical  . HOLMIUM LASER APPLICATION N/A [id number]   Procedure: HOLMIUM LASER APPLICATION WITH 3 CM RIGHT BLADDER STONE; Surgeon: Steven Clines, MD; Location: [facility] ORS; Service: Urology; Laterality: N/A;  . INGUINAL HERNIA REPAIR  09/2009   bilaterally  . KIDNEY TRANSPLANT    . LEFT HEART CATH AND CORS/GRAFTS ANGIOGRAPHY N/A 07/27/2019   Procedure: LEFT HEART CATH AND CORS/GRAFTS ANGIOGRAPHY; Surgeon: Steven Mocha, MD; Location: [place] CV LAB; Service: Cardiovascular; Laterality: N/A;  . LEFT HEART CATHETERIZATION WITH CORONARY ANGIOGRAM N/A 03/16/2012    Procedure: LEFT HEART CATHETERIZATION WITH CORONARY ANGIOGRAM; Surgeon: Steven Mocha, MD; Location: [facility] CATH LAB; Service: Cardiovascular; Laterality: N/A;  . RENAL ARTERY STENT  2001  . TEE WITHOUT CARDIOVERSION N/A 08/21/2017   Procedure: TRANSESOPHAGEAL ECHOCARDIOGRAM (TEE); Surgeon: Steven Dresser, MD; Location: [facility] ENDOSCOPY; Service: Cardiovascular; Laterality: N/A;  . TRANSURETHRAL RESECTION OF BLADDER TUMOR N/A 08/15/2016   Procedure: TRANSURETHRAL BIOPSY OF RIGHT BLADDER WALL; Surgeon: Steven Clines, MD; Location: [facility] ORS; Service: Urology; Laterality: N/A;         Current Outpatient Medications  Medication Sig Dispense Refill  . allopurinol (ZYLOPRIM) 100 MG tablet Take 200 mg by mouth daily.     Marland Kitchen amiodarone (PACERONE) 200 MG tablet TAKE 1 TABLET BY MOUTH TWICE A DAY 180 tablet 1  . cloNIDine (CATAPRES) 0.1 MG tablet Take 1 tablet (0.1 mg total) by mouth daily. 90 tablet 3  . diltiazem (CARDIZEM) 60 MG tablet Take 1 tablet as needed every  6 hours for rapid afib HR over 100. 45 tablet 1  . ELIQUIS 5 MG TABS tablet TAKE 1 TABLET BY MOUTH TWICE A DAY 60 tablet 5  . fenofibrate micronized (LOFIBRA) 134 MG capsule Take 134 mg by mouth daily.     . magnesium oxide (MAG-OX) 400 (241.3 Mg) MG tablet Take 400 mg by mouth 2 (two) times daily.    . metoprolol tartrate (LOPRESSOR) 50 MG tablet Take 1 tablet (50 mg total) by mouth 2 (two) times daily. 180 tablet 3  . mycophenolate (MYFORTIC) 180 MG EC tablet Take 720 mg by mouth 2 (two) times daily.     Marland Kitchen omeprazole (PRILOSEC) 20 MG capsule Take 20 mg by mouth daily.  5  . predniSONE (DELTASONE) 5 MG tablet Take 5 mg by mouth at bedtime.    . rosuvastatin (CRESTOR) 20 MG tablet Take 1 tablet (20 mg total) by mouth daily. 90 tablet 3  . sulfamethoxazole-trimethoprim (BACTRIM,SEPTRA) 400-80 MG tablet Take 1 tablet by mouth every Monday, Wednesday, and Friday.  6  . tacrolimus (PROGRAF) 0.5 MG capsule Take 0.5 mg by mouth 2 (two) times daily.    2  . tadalafil (CIALIS) 5 MG tablet Take 5 mg by mouth at bedtime.   11  . VASCEPA 1 g capsule TAKE 2 CAPSULES BY MOUTH 2 TIMES DAILY. 120 capsule 4   No current facility-administered medications for this visit.        Allergies  Allergen Reactions  . Contrast Media [Iodinated Diagnostic Agents] Other (See Comments)    Pt states that this medication causes him to code.   Marland Kitchen Penicillins Itching, Rash and Other (See Comments)    Has patient had a PCN reaction causing immediate rash, facial/tongue/throat swelling, SOB or lightheadedness with hypotension: No  Has patient had a PCN reaction causing severe rash involving mucus membranes or skin necrosis: No  Has patient had a PCN reaction that required hospitalization No  Has patient had a PCN reaction occurring within the last 10 years: No  If all of the above answers are "NO", then may proceed with Cephalosporin use.  Marland Kitchen Lisinopril     Increased Cr   Review of Systems negative except from HPI and PMH  BP 130/90  Pulse 96  Ht 5\' 8"  (1.727 m)  Wt 217 lb (98.4 kg)  SpO2 97%  BMI 32.99 kg/m  Well developed and nourished in no acute distress  HENT normal  Neck supple with JVP-7 cm  Clear  Rapid but regular rate and rhythm, no murmurs or gallops  Abd-soft with active BS  No Clubbing cyanosis edema  Skin-warm and dry  A & Oriented Grossly normal sensory and motor function  ECG atrial flutter with an atrial cycle length 280 ms with 2: 1 conduction  Assessment and Plan  Atrial Flutter S/p RFCA-- recurrent and atypical s/p ablation 2018-JA  Freq PACs  LAE  LVH-Asymmetric 18/11 HCM  CAD s/p CABG 2013  Sleep disordered breathing  Sinus bradycardia  Status post kidney transplant  Recurrent atrial flutter. We will submit for cardioversion. Reduce amiodarone back to 200 mg a day. Surveillance laboratories are normal within the last 3 months.  Discussed the role of considering an a another ablation attempt, not withstanding Dr. Jackalyn Lombard  reluctance. I will reach out to Dr. Noralee Stain about consultation for repeat ablation attempt primarily of his flutter circuits. He has had interval atrial fibrillation detected on his Alive[employer] but it has not been sustaining the same  wauy

## 2020-02-24 NOTE — Progress Notes (Signed)
Patient in pre procedure in NSR; EKG to verify.  Dr Gardiner Rhyme notified, patient wishes to speak to doctor bedside.

## 2020-02-24 NOTE — Discharge Instructions (Signed)
Electrical Cardioversion Electrical cardioversion is the delivery of a jolt of electricity to restore a normal rhythm to the heart. A rhythm that is too fast or is not regular keeps the heart from pumping well. In this procedure, sticky patches or metal paddles are placed on the chest to deliver electricity to the heart from a device. This procedure may be done in an emergency if:  There is low or no blood pressure as a result of the heart rhythm.  Normal rhythm must be restored as fast as possible to protect the brain and heart from further damage.  It may save a life. This may also be a scheduled procedure for irregular or fast heart rhythms that are not immediately life-threatening. Tell a health care provider about:  Any allergies you have.  All medicines you are taking, including vitamins, herbs, eye drops, creams, and over-the-counter medicines.  Any problems you or family members have had with anesthetic medicines.  Any blood disorders you have.  Any surgeries you have had.  Any medical conditions you have.  Whether you are pregnant or may be pregnant. What are the risks? Generally, this is a safe procedure. However, problems may occur, including:  Allergic reactions to medicines.  A blood clot that breaks free and travels to other parts of your body.  The possible return of an abnormal heart rhythm within hours or days after the procedure.  Your heart stopping (cardiac arrest). This is rare. What happens before the procedure? Medicines  Your health care provider may have you start taking: ? Blood-thinning medicines (anticoagulants) so your blood does not clot as easily. ? Medicines to help stabilize your heart rate and rhythm.  Ask your health care provider about: ? Changing or stopping your regular medicines. This is especially important if you are taking diabetes medicines or blood thinners. ? Taking medicines such as aspirin and ibuprofen. These medicines can  thin your blood. Do not take these medicines unless your health care provider tells you to take them. ? Taking over-the-counter medicines, vitamins, herbs, and supplements. General instructions  Follow instructions from your health care provider about eating or drinking restrictions.  Plan to have someone take you home from the hospital or clinic.  If you will be going home right after the procedure, plan to have someone with you for 24 hours.  Ask your health care provider what steps will be taken to help prevent infection. These may include washing your skin with a germ-killing soap. What happens during the procedure?   An IV will be inserted into one of your veins.  Sticky patches (electrodes) or metal paddles may be placed on your chest.  You will be given a medicine to help you relax (sedative).  An electrical shock will be delivered. The procedure may vary among health care providers and hospitals. What can I expect after the procedure?  Your blood pressure, heart rate, breathing rate, and blood oxygen level will be monitored until you leave the hospital or clinic.  Your heart rhythm will be watched to make sure it does not change.  You may have some redness on the skin where the shocks were given. Follow these instructions at home:  Do not drive for 24 hours if you were given a sedative during your procedure.  Take over-the-counter and prescription medicines only as told by your health care provider.  Ask your health care provider how to check your pulse. Check it often.  Rest for 48 hours after the procedure or   as told by your health care provider.  Avoid or limit your caffeine use as told by your health care provider.  Keep all follow-up visits as told by your health care provider. This is important. Contact a health care provider if:  You feel like your heart is beating too quickly or your pulse is not regular.  You have a serious muscle cramp that does not go  away. Get help right away if:  You have discomfort in your chest.  You are dizzy or you feel faint.  You have trouble breathing or you are short of breath.  Your speech is slurred.  You have trouble moving an arm or leg on one side of your body.  Your fingers or toes turn cold or blue. Summary  Electrical cardioversion is the delivery of a jolt of electricity to restore a normal rhythm to the heart.  This procedure may be done right away in an emergency or may be a scheduled procedure if the condition is not an emergency.  Generally, this is a safe procedure.  After the procedure, check your pulse often as told by your health care provider. This information is not intended to replace advice given to you by your health care provider. Make sure you discuss any questions you have with your health care provider. Document Revised: 06/21/2019 Document Reviewed: 06/21/2019 Elsevier Patient Education  2020 [employer]  

## 2020-02-24 NOTE — Transfer of Care (Signed)
Immediate Anesthesia Transfer of Care Note  Patient: [name]  Procedure(s) Performed: CARDIOVERSION (N/A )  Patient Location: Endoscopy Unit  Anesthesia Type:General  Level of Consciousness: awake and alert   Airway & Oxygen Therapy: Patient Spontanous Breathing and Patient connected to nasal cannula oxygen  Post-op Assessment: Report given to RN and Post -op Vital signs reviewed and stable  Post vital signs: Reviewed and stable  Last Vitals:  Vitals Value Taken Time  BP    Temp    Pulse 52 02/24/20 0801  Resp 21 02/24/20 0801  SpO2 99 % 02/24/20 0801    Last Pain:  Vitals:   02/24/20 0739  TempSrc: Oral  PainSc: 0-No pain         Complications: No apparent anesthesia complications

## 2020-02-24 NOTE — CV Procedure (Signed)
Procedure:   DCCV  Indication:  Symptomatic atrial flutter  Procedure Note:  The patient signed informed consent.  They have had had therapeutic anticoagulation with Eliquis greater than 3 weeks.  Anesthesia was administered by Dr. Broadus John.  Adequate airway was maintained throughout and vital followed per protocol.  They were cardioverted x 1 with 100J of biphasic synchronized energy.  They converted to NSR with rate 50s.  There were no apparent complications.  The patient had normal neuro status and respiratory status post procedure with vitals stable as recorded elsewhere.    Follow up:  They will continue on current medical therapy and follow up with cardiology as scheduled.  Oswaldo Milian, MD 02/24/2020 7:54 AM

## 2020-02-24 NOTE — Anesthesia Postprocedure Evaluation (Signed)
Anesthesia Post Note  Patient: [name]  Procedure(s) Performed: CARDIOVERSION (N/A )     Patient location during evaluation: PACU Anesthesia Type: General Level of consciousness: awake and alert Pain management: pain level controlled Vital Signs Assessment: post-procedure vital signs reviewed and stable Respiratory status: spontaneous breathing, nonlabored ventilation, respiratory function stable and patient connected to nasal cannula oxygen Cardiovascular status: blood pressure returned to baseline and stable Postop Assessment: no apparent nausea or vomiting Anesthetic complications: no    Last Vitals:  Vitals:   02/24/20 0820 02/24/20 0823  BP:  102/71  Pulse: (!) 54 (!) 52  Resp: (!) 22 17  Temp:    SpO2: 100% 99%    Last Pain:  Vitals:   02/24/20 0823  TempSrc:   PainSc: 0-No pain                 Barnet Glasgow

## 2020-02-26 NOTE — Addendum Note (Signed)
Addendum  created 02/26/20 1452 by Barnet Glasgow, MD   Intraprocedure Staff edited

## 2020-03-01 MED FILL — ELIQUIS 5 MG TABLET: 5 | 30 days supply | Qty: 60 | Fill #3

## 2020-03-01 MED FILL — OMEPRAZOLE DR 20 MG CAPSULE: 20 | 30 days supply | Qty: 30 | Fill #0

## 2020-03-06 ENCOUNTER — Telehealth: Payer: Self-pay

## 2020-03-06 NOTE — Telephone Encounter (Signed)
Per request ogf Dr Caryl Comes - [facility] to pt to verify pt appointment has been scheduled with Dr Lenard Galloway.  Spoke with pt who states appointment has been scheduled for 04/26 or 03/28/20.  Pt plans to keep appointment.  Pt has no further questions or concerns at this time.  Pt thanked Therapist, sports for call and following up.

## 2020-03-07 DIAGNOSIS — D751 Secondary polycythemia: Secondary | ICD-10-CM | POA: Diagnosis not present

## 2020-03-07 DIAGNOSIS — I4891 Unspecified atrial fibrillation: Secondary | ICD-10-CM | POA: Diagnosis not present

## 2020-03-07 DIAGNOSIS — N400 Enlarged prostate without lower urinary tract symptoms: Secondary | ICD-10-CM | POA: Diagnosis not present

## 2020-03-07 DIAGNOSIS — I4892 Unspecified atrial flutter: Secondary | ICD-10-CM | POA: Diagnosis not present

## 2020-03-07 DIAGNOSIS — E785 Hyperlipidemia, unspecified: Secondary | ICD-10-CM | POA: Diagnosis not present

## 2020-03-07 DIAGNOSIS — I2510 Atherosclerotic heart disease of native coronary artery without angina pectoris: Secondary | ICD-10-CM | POA: Diagnosis not present

## 2020-03-07 DIAGNOSIS — I129 Hypertensive chronic kidney disease with stage 1 through stage 4 chronic kidney disease, or unspecified chronic kidney disease: Secondary | ICD-10-CM | POA: Diagnosis not present

## 2020-03-07 DIAGNOSIS — Z940 Kidney transplant status: Secondary | ICD-10-CM | POA: Diagnosis not present

## 2020-03-08 MED FILL — FENOFIBRATE 134 MG CAPSULE: 134 | 30 days supply | Qty: 30 | Fill #3

## 2020-03-08 MED FILL — ALLOPURINOL 100 MG TABS: 100 | 30 days supply | Qty: 60 | Fill #5

## 2020-03-09 MED FILL — SULFAMETHOXAZOLE-TMP SS TAB: 400-80 | 28 days supply | Qty: 12 | Fill #0

## 2020-03-14 MED FILL — TACROLIMUS 1 MG CAPSULE: 1 | 30 days supply | Qty: 30 | Fill #0

## 2020-03-15 MED FILL — MYCOPHENOLIC ACID DR 180 MG: 180 | 30 days supply | Qty: 240 | Fill #1

## 2020-03-15 MED FILL — predniSONE 5 MG TABS: 5 | 30 days supply | Qty: 30 | Fill #5

## 2020-03-28 DIAGNOSIS — I498 Other specified cardiac arrhythmias: Secondary | ICD-10-CM | POA: Diagnosis not present

## 2020-03-29 DIAGNOSIS — Z940 Kidney transplant status: Secondary | ICD-10-CM | POA: Diagnosis not present

## 2020-03-29 MED FILL — ELIQUIS 5 MG TABLET: 5 | 30 days supply | Qty: 60 | Fill #4

## 2020-03-29 MED FILL — TACROLIMUS ANHYDROUS 0.5MG: 0.5 | 30 days supply | Qty: 60 | Fill #4

## 2020-03-29 MED FILL — VASCEPA 1 GM CAPSULE: 1 | 30 days supply | Qty: 120 | Fill #2

## 2020-03-29 MED FILL — OMEPRAZOLE DR 20 MG CAPSULE: 20 | 30 days supply | Qty: 30 | Fill #1

## 2020-03-29 MED FILL — TADALAFIL 5 MG TABS: 5 | 90 days supply | Qty: 90 | Fill #1

## 2020-04-03 ENCOUNTER — Ambulatory Visit (INDEPENDENT_AMBULATORY_CARE_PROVIDER_SITE_OTHER): Payer: 59 | Admitting: Internal Medicine

## 2020-04-03 ENCOUNTER — Other Ambulatory Visit: Payer: Self-pay

## 2020-04-03 ENCOUNTER — Encounter: Payer: Self-pay | Admitting: Internal Medicine

## 2020-04-03 VITALS — BP 114/82 | HR 46 | Ht 68.0 in | Wt 213.8 lb

## 2020-04-03 DIAGNOSIS — I484 Atypical atrial flutter: Secondary | ICD-10-CM | POA: Diagnosis not present

## 2020-04-03 DIAGNOSIS — I491 Atrial premature depolarization: Secondary | ICD-10-CM

## 2020-04-03 MED ORDER — AMIODARONE HCL 200 MG PO TABS: 200.0000 mg | ORAL_TABLET | Freq: Every day | ORAL | 1 refills | Status: DC

## 2020-04-03 NOTE — Progress Notes (Signed)
.    A serie  Patient Care Team: Patient, No Pcp Per as PCP - General (General Practice)   HPI  [name] is a 59 y.o. male Seen in followup for concerns about atrial fibrillation in the context of significant left atrial enlargement prior atrial flutter for which he underwent catheter ablation 2013 and left ventricular hypertrophy.  Presumed HCM.  Spoke with Dr. Broadus John.  Has deferred genetic testing.  Ischemic heart disease with prior bypass surgery 4/13 and had post CABG atrial fibrillation.   Atypical and typical flutter but amplitudes particularly in lead V1 which are very atypical.  He saw Dr. Greggory Brandy and underwent catheter ablation for atrial flutter as well as atrial fibrillation.  9/18.. Atrial flutter was noninducible.  Pulmonary vein isolation was accomplished.  Multiple PACs.  Further repeat ablation was not recommended   Now maintained on  amio; was referred to Dr. Iverson Alamin and is anticipating ablation later this month  Has hx of remote stroke   In December he noted the abrupt onset of a change in his status.  His heart rate went from being in the 50s to the 110s.  Deterioration in exercise tolerance.  Fatigue.     Patient denies symptoms of GI intolerance, sun sensitivity, neurological symptoms attributable to amiodarone.     DATE TEST EF   9/15 Echo   55-65 % LVH severe LAE (53/2.4)  7/16 cMRI   ASH Mid--apical  cavitary obliteration  No contrast 2/2 renal disease  9/18 TEE 65% HCM         Date Cr K TSH LFTs Hgb  9/17 1.5 4.6      10/19 1.5 4.1   17  5/20 1.26 4.7   16.1  3/21 1.56 4.6 1.12          He has resting bradycardia  Past Medical History:  Diagnosis Date  . Arteriovenous fistula, acquired ([place])   . Atrial fibrillation and flutter ([place])   . Bladder stones   . BMI 31.0-31.9,adult   . CAD (coronary artery disease)    a. s/p CABG 03/2012: LIMA to LAD, free RIMA to OM1, SVG to D1, sequential SVG to AM and LPLB2, EVH via right thigh and leg.  .  Cellulitis 04/13/2012   RLE saphenous vein harvest incision   . CKD (chronic kidney disease)   . Dyslipidemia   . Gout   . History of kidney transplant   . Hyperglycemia   . Hyperlipidemia   . Hyperparathyroidism   . Hypertension   . Hypertrophic cardiomyopathy ([place])   . Hypomagnesemia   . Immunosuppression ([place])   . Intermittent palpitations   . Microscopic hematuria   . Migraines   . PAF and Flutter    a. Afib post-op CABG; AFlutter 10/2012  . Peripheral vascular disease ([place])   . Post-transplant erythrocytosis   . Renal disease   . S/P living-donor kidney transplantation   . Sinus node dysfunction-post termination pause    a. >8sec in setting of aflutter 10/2012  . Sleep pattern disturbance   . Stroke [facility]) 1995; 2001   denies residual  . Superficial vein thrombosis    a. R greater saphenous 04/2012  . Vitamin D deficiency   . VT (ventricular tachycardia) ([place])     Past Surgical History:  Procedure Laterality Date  . ATRIAL FIBRILLATION ABLATION N/A 08/21/2017   Procedure: Atrial Fibrillation Ablation;  Surgeon: [name], MD;  Location: [place] CV LAB;  Service: Cardiovascular;  Laterality:  N/A;  . ATRIAL FLUTTER ABLATION N/A 11/09/2012   Procedure: ATRIAL FLUTTER ABLATION;  Surgeon: [name], MD;  Location: [facility] CATH LAB;  Service: Cardiovascular;  Laterality: N/A;  . AV FISTULA PLACEMENT  2011   left forearm  . AV FISTULA PLACEMENT    . CARDIAC CATHETERIZATION  03/16/12  . CARDIOVERSION N/A 06/10/2017   Procedure: CARDIOVERSION;  Surgeon: Pixie Casino, MD;  Location: [facility] ENDOSCOPY;  Service: Cardiovascular;  Laterality: N/A;  . CARDIOVERSION N/A 10/13/2017   Procedure: CARDIOVERSION;  Surgeon: Dorothy Spark, MD;  Location: [facility] ENDOSCOPY;  Service: Cardiovascular;  Laterality: N/A;  . CARDIOVERSION N/A 10/08/2018   Procedure: CARDIOVERSION;  Surgeon: Larey Dresser, MD;  Location: [facility] ENDOSCOPY;  Service: Cardiovascular;  Laterality: N/A;  .  CARDIOVERSION N/A 04/27/2019   Procedure: CARDIOVERSION;  Surgeon: Dorothy Spark, MD;  Location: [facility] ENDOSCOPY;  Service: Cardiovascular;  Laterality: N/A;  . CARDIOVERSION N/A 02/24/2020   Procedure: CARDIOVERSION;  Surgeon: Donato Heinz, MD;  Location: [place];  Service: Cardiovascular;  Laterality: N/A;  . CORONARY ARTERY BYPASS GRAFT  03/19/2012   Procedure: CORONARY ARTERY BYPASS GRAFTING (CABG);  Surgeon: Rexene Alberts, MD;  Location: [place];  Service: Open Heart Surgery;  Laterality: N/A;  (B) MAMMARY  . CYSTOSCOPY WITH BIOPSY N/A 08/15/2016   Procedure: CYSTOSCOPY WITH IDENTIFICATION OF RIGHT TRANSPLANTATION OF URETRAL ORFICE WITH FLUORESCEIN;  Surgeon: Carolan Clines, MD;  [facility];  Service: Urology;  Laterality: N/A;  . DENTAL SURGERY  2008   multiple  . [place]   left  . FRACTURE SURGERY    . HERNIA REPAIR  [id number]   umbilical  . HOLMIUM LASER APPLICATION N/A [id number]   Procedure: HOLMIUM LASER APPLICATION WITH 3 CM RIGHT BLADDER STONE;  Surgeon: Carolan Clines, MD;  [facility];  Service: Urology;  Laterality: N/A;  . INGUINAL HERNIA REPAIR  09/2009   bilaterally  . KIDNEY TRANSPLANT    . LEFT HEART CATH AND CORS/GRAFTS ANGIOGRAPHY N/A 07/27/2019   Procedure: LEFT HEART CATH AND CORS/GRAFTS ANGIOGRAPHY;  Surgeon: Sherren Mocha, MD;  Location: [place] CV LAB;  Service: Cardiovascular;  Laterality: N/A;  . LEFT HEART CATHETERIZATION WITH CORONARY ANGIOGRAM N/A 03/16/2012   Procedure: LEFT HEART CATHETERIZATION WITH CORONARY ANGIOGRAM;  Surgeon: Sherren Mocha, MD;  Location: [facility] CATH LAB;  Service: Cardiovascular;  Laterality: N/A;  . RENAL ARTERY STENT  2001  . TEE WITHOUT CARDIOVERSION N/A 08/21/2017   Procedure: TRANSESOPHAGEAL ECHOCARDIOGRAM (TEE);  Surgeon: Larey Dresser, MD;  Location: [facility] ENDOSCOPY;  Service: Cardiovascular;  Laterality: N/A;  . TRANSURETHRAL RESECTION OF BLADDER TUMOR N/A 08/15/2016    Procedure: TRANSURETHRAL BIOPSY OF RIGHT BLADDER WALL;  Surgeon: Carolan Clines, MD;  [facility];  Service: Urology;  Laterality: N/A;    Current Outpatient Medications  Medication Sig Dispense Refill  . allopurinol (ZYLOPRIM) 100 MG tablet Take 200 mg by mouth daily.     Marland Kitchen amiodarone (PACERONE) 200 MG tablet TAKE 1 TABLET BY MOUTH TWICE A DAY (Patient taking differently: Take 200 mg by mouth in the morning and at bedtime. ) 180 tablet 1  . cloNIDine (CATAPRES) 0.1 MG tablet Take 1 tablet (0.1 mg total) by mouth daily. (Patient taking differently: Take 0.1 mg by mouth every evening. ) 90 tablet 3  . diltiazem (CARDIZEM) 60 MG tablet Take 1 tablet as needed every 6 hours for rapid afib HR over 100. (Patient taking differently: Take 60 mg by mouth every 6 (  six) hours as needed (rapid afib HR over 100). ) 45 tablet 1  . ELIQUIS 5 MG TABS tablet TAKE 1 TABLET BY MOUTH TWICE A DAY (Patient taking differently: Take 5 mg by mouth 2 (two) times daily. ) 60 tablet 5  . fenofibrate micronized (LOFIBRA) 134 MG capsule Take 134 mg by mouth daily.     . magnesium oxide (MAG-OX) 400 (241.3 Mg) MG tablet Take 400 mg by mouth 2 (two) times daily.    . metoprolol tartrate (LOPRESSOR) 50 MG tablet Take 1 tablet (50 mg total) by mouth 2 (two) times daily. 180 tablet 3  . mycophenolate (MYFORTIC) 180 MG EC tablet Take 720 mg by mouth 2 (two) times daily.     Marland Kitchen omeprazole (PRILOSEC) 20 MG capsule Take 20 mg by mouth daily.  5  . predniSONE (DELTASONE) 5 MG tablet Take 5 mg by mouth at bedtime.    . sulfamethoxazole-trimethoprim (BACTRIM,SEPTRA) 400-80 MG tablet Take 1 tablet by mouth every Monday, Wednesday, and Friday.  6  . tacrolimus (PROGRAF) 0.5 MG capsule Take 0.5 mg by mouth 2 (two) times daily.   2  . tadalafil (CIALIS) 5 MG tablet Take 5 mg by mouth at bedtime.   11  . VASCEPA 1 g capsule TAKE 2 CAPSULES BY MOUTH 2 TIMES DAILY. (Patient taking differently: Take 2 g by mouth in the morning and at  bedtime. ) 120 capsule 4  . rosuvastatin (CRESTOR) 20 MG tablet Take 1 tablet (20 mg total) by mouth daily. (Patient taking differently: Take 20 mg by mouth 3 (three) times a week. ) 90 tablet 3   No current facility-administered medications for this visit.    Allergies  Allergen Reactions  . Contrast Media [Iodinated Diagnostic Agents] Other (See Comments)    Pt states that this medication causes him to code.   Marland Kitchen Penicillins Itching, Rash and Other (See Comments)    Has patient had a PCN reaction causing immediate rash, facial/tongue/throat swelling, SOB or lightheadedness with hypotension: No Has patient had a PCN reaction causing severe rash involving mucus membranes or skin necrosis: No Has patient had a PCN reaction that required hospitalization No Has patient had a PCN reaction occurring within the last 10 years: No If all of the above answers are "NO", then may proceed with Cephalosporin use.  Marland Kitchen Lisinopril     Increased Cr      Review of Systems negative except from HPI and PMH  BP 114/82   Pulse (!) 46   Ht 5\' 8"  (1.727 m)   Wt 213 lb 12.8 oz (97 kg)   BMI 32.51 kg/m  Well developed and nourished in no acute distress HENT normal Neck supple with JVP-7 cm Clear Rapid but regular rate and rhythm, no murmurs or gallops Abd-soft with active BS No Clubbing cyanosis edema Skin-warm and dry A & Oriented  Grossly normal sensory and motor function  ECG sinus at 46 Intervals 18/12/55  Assessment and  Plan   Atrial Flutter  S/p RFCA-- recurrent and atypical s/p ablation 2018-JA  Freq PACs  LAE  LVH-Asymmetric 18/11  HCM    CAD s/p CABG 2013  Sleep disordered breathing   Sinus bradycardia  Status post kidney transplant     Anticipating ablation this month.  Hopefully this will allow the discontinuation of his amiodarone.  Reviewed again his HCM.  He is spoken with Dr. Broadus John; has deferred genetic testing.  Without symptoms of  ischemia

## 2020-04-03 NOTE — Patient Instructions (Signed)

## 2020-04-05 ENCOUNTER — Other Ambulatory Visit (HOSPITAL_COMMUNITY): Payer: Self-pay | Admitting: Nephrology

## 2020-04-05 MED FILL — ALLOPURINOL 100 MG TABS: 100 | 30 days supply | Qty: 60 | Fill #0

## 2020-04-05 MED FILL — FENOFIBRATE 134 MG CAPSULE: 134 | 30 days supply | Qty: 30 | Fill #4

## 2020-04-05 MED FILL — SULFAMETHOXAZOLE-TMP SS TAB: 400-80 | 28 days supply | Qty: 12 | Fill #1

## 2020-04-19 ENCOUNTER — Other Ambulatory Visit (HOSPITAL_COMMUNITY): Payer: Self-pay | Admitting: Nephrology

## 2020-04-19 MED FILL — predniSONE 5 MG TABS: 5 | 30 days supply | Qty: 30 | Fill #0

## 2020-04-26 MED FILL — ELIQUIS 5 MG TABLET: 5 | 30 days supply | Qty: 60 | Fill #5

## 2020-04-26 MED FILL — VASCEPA 1 GM CAPSULE: 1 | 30 days supply | Qty: 120 | Fill #3

## 2020-04-26 MED FILL — OMEPRAZOLE DR 20 MG CAPSULE: 20 | 30 days supply | Qty: 30 | Fill #2

## 2020-05-02 DIAGNOSIS — I4892 Unspecified atrial flutter: Secondary | ICD-10-CM | POA: Diagnosis not present

## 2020-05-02 DIAGNOSIS — N400 Enlarged prostate without lower urinary tract symptoms: Secondary | ICD-10-CM | POA: Diagnosis not present

## 2020-05-02 DIAGNOSIS — E785 Hyperlipidemia, unspecified: Secondary | ICD-10-CM | POA: Diagnosis not present

## 2020-05-02 DIAGNOSIS — I129 Hypertensive chronic kidney disease with stage 1 through stage 4 chronic kidney disease, or unspecified chronic kidney disease: Secondary | ICD-10-CM | POA: Diagnosis not present

## 2020-05-02 DIAGNOSIS — D751 Secondary polycythemia: Secondary | ICD-10-CM | POA: Diagnosis not present

## 2020-05-02 DIAGNOSIS — I2510 Atherosclerotic heart disease of native coronary artery without angina pectoris: Secondary | ICD-10-CM | POA: Diagnosis not present

## 2020-05-02 DIAGNOSIS — I4891 Unspecified atrial fibrillation: Secondary | ICD-10-CM | POA: Diagnosis not present

## 2020-05-02 DIAGNOSIS — Z940 Kidney transplant status: Secondary | ICD-10-CM | POA: Diagnosis not present

## 2020-05-03 MED FILL — SULFAMETHOXAZOLE-TMP SS TAB: 400-80 | 28 days supply | Qty: 12 | Fill #2

## 2020-05-03 MED FILL — ALLOPURINOL 100 MG TABS: 100 | 30 days supply | Qty: 60 | Fill #1

## 2020-05-03 MED FILL — FENOFIBRATE 134 MG CAPSULE: 134 | 30 days supply | Qty: 30 | Fill #5

## 2020-05-03 MED FILL — TACROLIMUS ANHYDROUS 0.5MG: 0.5 | 30 days supply | Qty: 60 | Fill #5

## 2020-05-05 ENCOUNTER — Emergency Department (HOSPITAL_BASED_OUTPATIENT_CLINIC_OR_DEPARTMENT_OTHER): Payer: 59

## 2020-05-05 ENCOUNTER — Emergency Department (HOSPITAL_BASED_OUTPATIENT_CLINIC_OR_DEPARTMENT_OTHER)
Admission: EM | Admit: 2020-05-05 | Discharge: 2020-05-05 | Disposition: A | Discharge status: Home / Self Care | Payer: 59 | Attending: Emergency Medicine | Admitting: Emergency Medicine

## 2020-05-05 ENCOUNTER — Other Ambulatory Visit: Payer: Self-pay

## 2020-05-05 ENCOUNTER — Encounter (HOSPITAL_BASED_OUTPATIENT_CLINIC_OR_DEPARTMENT_OTHER): Payer: Self-pay

## 2020-05-05 DIAGNOSIS — S8991XA Unspecified injury of right lower leg, initial encounter: Secondary | ICD-10-CM | POA: Diagnosis not present

## 2020-05-05 DIAGNOSIS — Z940 Kidney transplant status: Secondary | ICD-10-CM | POA: Insufficient documentation

## 2020-05-05 DIAGNOSIS — Y999 Unspecified external cause status: Secondary | ICD-10-CM | POA: Insufficient documentation

## 2020-05-05 DIAGNOSIS — S8012XA Contusion of left lower leg, initial encounter: Secondary | ICD-10-CM

## 2020-05-05 DIAGNOSIS — I129 Hypertensive chronic kidney disease with stage 1 through stage 4 chronic kidney disease, or unspecified chronic kidney disease: Secondary | ICD-10-CM | POA: Insufficient documentation

## 2020-05-05 DIAGNOSIS — Y939 Activity, unspecified: Secondary | ICD-10-CM | POA: Insufficient documentation

## 2020-05-05 DIAGNOSIS — S80921A Unspecified superficial injury of right lower leg, initial encounter: Secondary | ICD-10-CM | POA: Diagnosis present

## 2020-05-05 DIAGNOSIS — I2510 Atherosclerotic heart disease of native coronary artery without angina pectoris: Secondary | ICD-10-CM | POA: Diagnosis not present

## 2020-05-05 DIAGNOSIS — S8011XA Contusion of right lower leg, initial encounter: Secondary | ICD-10-CM | POA: Insufficient documentation

## 2020-05-05 DIAGNOSIS — Y929 Unspecified place or not applicable: Secondary | ICD-10-CM | POA: Insufficient documentation

## 2020-05-05 DIAGNOSIS — W19XXXA Unspecified fall, initial encounter: Secondary | ICD-10-CM

## 2020-05-05 DIAGNOSIS — Z87891 Personal history of nicotine dependence: Secondary | ICD-10-CM | POA: Insufficient documentation

## 2020-05-05 DIAGNOSIS — Z79899 Other long term (current) drug therapy: Secondary | ICD-10-CM | POA: Diagnosis not present

## 2020-05-05 DIAGNOSIS — N183 Chronic kidney disease, stage 3 unspecified: Secondary | ICD-10-CM | POA: Insufficient documentation

## 2020-05-05 DIAGNOSIS — W010XXA Fall on same level from slipping, tripping and stumbling without subsequent striking against object, initial encounter: Secondary | ICD-10-CM | POA: Diagnosis not present

## 2020-05-05 DIAGNOSIS — S6991XA Unspecified injury of right wrist, hand and finger(s), initial encounter: Secondary | ICD-10-CM | POA: Diagnosis not present

## 2020-05-05 DIAGNOSIS — Z7901 Long term (current) use of anticoagulants: Secondary | ICD-10-CM | POA: Diagnosis not present

## 2020-05-05 DIAGNOSIS — Z951 Presence of aortocoronary bypass graft: Secondary | ICD-10-CM | POA: Insufficient documentation

## 2020-05-05 DIAGNOSIS — M79641 Pain in right hand: Secondary | ICD-10-CM | POA: Diagnosis not present

## 2020-05-05 MED ORDER — ACETAMINOPHEN 325 MG PO TABS
650.0000 mg | ORAL_TABLET | Freq: Once | ORAL | Status: AC
  Administered 2020-05-05: 650 mg via ORAL
  Filled 2020-05-05: qty 2

## 2020-05-05 MED ORDER — OXYCODONE-ACETAMINOPHEN 5-325 MG PO TABS: 1.0000 | ORAL_TABLET | ORAL | 0 refills | Status: AC | PRN

## 2020-05-05 MED FILL — OXYCODONE-ACETAMINOPHEN 5-3: 5-325 | 3 days supply | Qty: 10 | Fill #0

## 2020-05-05 NOTE — ED Notes (Signed)
Patient transported to X-ray 

## 2020-05-05 NOTE — ED Triage Notes (Signed)
Pt reports "ground level fall last night"-pain right hand, right LE-NAD-steady gait

## 2020-05-05 NOTE — Discharge Instructions (Addendum)
Recommend rest, ice, elevation of your leg.  You can take the prescribed Percocet as needed for breakthrough pain.  Otherwise recommend using Tylenol.

## 2020-05-05 NOTE — ED Provider Notes (Signed)
[place] EMERGENCY DEPARTMENT Provider Note   CSN: [id number] Arrival date & time: 05/05/20  1329     History Chief Complaint  Patient presents with  . Fall    [name] is a 59 y.o. male.  Past medical history A. fib, kidney disease s/p kidney transplant, CAD.  On anticoagulation.  Presents to ER after ground-level fall.  Fell last night, tripped.  Landed on his right side.  Hit right shin, right hand.  Denies trauma anywhere else, specifically denies any head trauma, chest or abdominal trauma.  Has ambulated without difficulty.  States pain currently moderate, worse in right hand as well as right mid lower leg.  No headache, nausea vomiting or chest pain or difficulty in breathing.  HPI     Past Medical History:  Diagnosis Date  . Arteriovenous fistula, acquired ([place])   . Atrial fibrillation and flutter ([place])   . Bladder stones   . BMI 31.0-31.9,adult   . CAD (coronary artery disease)    a. s/p CABG 03/2012: LIMA to LAD, free RIMA to OM1, SVG to D1, sequential SVG to AM and LPLB2, EVH via right thigh and leg.  . Cellulitis 04/13/2012   RLE saphenous vein harvest incision   . CKD (chronic kidney disease)   . Dyslipidemia   . Gout   . History of kidney transplant   . Hyperglycemia   . Hyperlipidemia   . Hyperparathyroidism   . Hypertension   . Hypertrophic cardiomyopathy ([place])   . Hypomagnesemia   . Immunosuppression ([place])   . Intermittent palpitations   . Microscopic hematuria   . Migraines   . PAF and Flutter    a. Afib post-op CABG; AFlutter 10/2012  . Peripheral vascular disease ([place])   . Post-transplant erythrocytosis   . Renal disease   . S/P living-donor kidney transplantation   . Sinus node dysfunction-post termination pause    a. >8sec in setting of aflutter 10/2012  . Sleep pattern disturbance   . Stroke [facility]) 1995; 2001   denies residual  . Superficial vein thrombosis    a. R greater saphenous 04/2012  . Vitamin D deficiency   . VT  (ventricular tachycardia) [facility])     Patient Active Problem List   Diagnosis Date Noted  . Angina pectoris ([place]) 07/27/2019  . Atrial fibrillation ([place]) 08/21/2017  . Hypomagnesemia 06/11/2017  . NSVT (nonsustained ventricular tachycardia) ([place]) 06/11/2017  . Atrial fibrillation with RVR ([place]) 06/09/2017  . Pure hypercholesterolemia   . Demand ischemia ([place])   . CAD (coronary artery disease)   . Atrial flutter ([place]) 03/01/2017  . History of renal transplant 02/16/2017  . Immunosuppressed status ([place]) 02/16/2017  . Hypertensive heart disease without CHF 02/16/2017  . Thrombocytopenia ([place]) 02/16/2017  . Long term (current) use of anticoagulants 11/02/2012  . Stroke ([place])   . S/P CABG (coronary artery bypass graft) 05/12/2012  . Smoking 01/15/2012  . CKD (chronic kidney disease), stage III 01/15/2012    Past Surgical History:  Procedure Laterality Date  . ATRIAL FIBRILLATION ABLATION N/A 08/21/2017   Procedure: Atrial Fibrillation Ablation;  Surgeon: [name], MD;  Location: [place] CV LAB;  Service: Cardiovascular;  Laterality: N/A;  . ATRIAL FLUTTER ABLATION N/A 11/09/2012   Procedure: ATRIAL FLUTTER ABLATION;  Surgeon: [name], MD;  Location: [facility] CATH LAB;  Service: Cardiovascular;  Laterality: N/A;  . AV FISTULA PLACEMENT  2011   left forearm  . AV FISTULA PLACEMENT    . CARDIAC CATHETERIZATION  03/16/12  . CARDIOVERSION N/A 06/10/2017   Procedure: CARDIOVERSION;  Surgeon: Pixie Casino, MD;  Location: [facility] ENDOSCOPY;  Service: Cardiovascular;  Laterality: N/A;  . CARDIOVERSION N/A 10/13/2017   Procedure: CARDIOVERSION;  Surgeon: Dorothy Spark, MD;  Location: [facility] ENDOSCOPY;  Service: Cardiovascular;  Laterality: N/A;  . CARDIOVERSION N/A 10/08/2018   Procedure: CARDIOVERSION;  Surgeon: Larey Dresser, MD;  Location: [facility] ENDOSCOPY;  Service: Cardiovascular;  Laterality: N/A;  . CARDIOVERSION N/A 04/27/2019   Procedure: CARDIOVERSION;  Surgeon: Dorothy Spark, MD;  Location: [facility] ENDOSCOPY;  Service: Cardiovascular;  Laterality: N/A;  . CARDIOVERSION N/A 02/24/2020   Procedure: CARDIOVERSION;  Surgeon: Donato Heinz, MD;  Location: [place];  Service: Cardiovascular;  Laterality: N/A;  . CORONARY ARTERY BYPASS GRAFT  03/19/2012   Procedure: CORONARY ARTERY BYPASS GRAFTING (CABG);  Surgeon: Rexene Alberts, MD;  Location: [place];  Service: Open Heart Surgery;  Laterality: N/A;  (B) MAMMARY  . CYSTOSCOPY WITH BIOPSY N/A 08/15/2016   Procedure: CYSTOSCOPY WITH IDENTIFICATION OF RIGHT TRANSPLANTATION OF URETRAL ORFICE WITH FLUORESCEIN;  Surgeon: Carolan Clines, MD;  [facility];  Service: Urology;  Laterality: N/A;  . DENTAL SURGERY  2008   multiple  . [place]   left  . FRACTURE SURGERY    . HERNIA REPAIR  21/3086   umbilical  . HOLMIUM LASER APPLICATION N/A 5/78/4696   Procedure: HOLMIUM LASER APPLICATION WITH 3 CM RIGHT BLADDER STONE;  Surgeon: Carolan Clines, MD;  [facility];  Service: Urology;  Laterality: N/A;  . INGUINAL HERNIA REPAIR  09/2009   bilaterally  . KIDNEY TRANSPLANT    . LEFT HEART CATH AND CORS/GRAFTS ANGIOGRAPHY N/A 07/27/2019   Procedure: LEFT HEART CATH AND CORS/GRAFTS ANGIOGRAPHY;  Surgeon: Sherren Mocha, MD;  Location: [place] CV LAB;  Service: Cardiovascular;  Laterality: N/A;  . LEFT HEART CATHETERIZATION WITH CORONARY ANGIOGRAM N/A 03/16/2012   Procedure: LEFT HEART CATHETERIZATION WITH CORONARY ANGIOGRAM;  Surgeon: Sherren Mocha, MD;  Location: [facility] CATH LAB;  Service: Cardiovascular;  Laterality: N/A;  . RENAL ARTERY STENT  2001  . TEE WITHOUT CARDIOVERSION N/A 08/21/2017   Procedure: TRANSESOPHAGEAL ECHOCARDIOGRAM (TEE);  Surgeon: Larey Dresser, MD;  Location: [facility] ENDOSCOPY;  Service: Cardiovascular;  Laterality: N/A;  . TRANSURETHRAL RESECTION OF BLADDER TUMOR N/A 08/15/2016   Procedure: TRANSURETHRAL BIOPSY OF RIGHT BLADDER WALL;  Surgeon: Carolan Clines, MD;   [facility];  Service: Urology;  Laterality: N/A;       Family History  Adopted: Yes  Problem Relation Age of Onset  . Hypertension Other     Social History   Tobacco Use  . Smoking status: Former Smoker    Packs/day: 0.30    Years: 30.00    Pack years: 9.00    Types: Cigarettes    Quit date: 03/15/2012    Years since quitting: 8.1  . Smokeless tobacco: Never Used  Substance Use Topics  . Alcohol use: Yes    Comment: occ  . Drug use: No    Home Medications Prior to Admission medications   Medication Sig Start Date End Date Taking? Authorizing Provider  allopurinol (ZYLOPRIM) 100 MG tablet Take 200 mg by mouth daily.     [provider]  amiodarone (PACERONE) 200 MG tablet Take 1 tablet (200 mg total) by mouth daily. 04/03/20   [name], MD  cloNIDine (CATAPRES) 0.1 MG tablet Take 1 tablet (0.1 mg total) by mouth daily. Patient taking differently:  Take 0.1 mg by mouth every evening.  07/26/19   [name], MD  diltiazem (CARDIZEM) 60 MG tablet Take 1 tablet as needed every 6 hours for rapid afib HR over 100. Patient taking differently: Take 60 mg by mouth every 6 (six) hours as needed (rapid afib HR over 100).  06/30/19   [name], MD  ELIQUIS 5 MG TABS tablet TAKE 1 TABLET BY MOUTH TWICE A DAY Patient taking differently: Take 5 mg by mouth 2 (two) times daily.  11/29/19   [name], MD  fenofibrate micronized (LOFIBRA) 134 MG capsule Take 134 mg by mouth daily.     [provider]  magnesium oxide (MAG-OX) 400 (241.3 Mg) MG tablet Take 400 mg by mouth 2 (two) times daily.    [provider]  metoprolol tartrate (LOPRESSOR) 50 MG tablet Take 1 tablet (50 mg total) by mouth 2 (two) times daily. 07/26/19 02/17/22  [name], MD  mycophenolate (MYFORTIC) 180 MG EC tablet Take 720 mg by mouth 2 (two) times daily.     [provider]  omeprazole (PRILOSEC) 20 MG capsule Take 20 mg by mouth daily.    [provider]  oxyCODONE-acetaminophen (PERCOCET) 5-325 MG tablet Take 1 tablet by mouth every 4 (four) hours as needed for up to 3 days for severe pain. 05/05/20 05/08/20  Lucrezia Starch, MD  predniSONE (DELTASONE) 5 MG tablet Take 5 mg by mouth at bedtime.    [provider]  rosuvastatin (CRESTOR) 20 MG tablet Take 1 tablet (20 mg total) by mouth daily. Patient taking differently: Take 20 mg by mouth 3 (three) times a week.  03/12/19 03/11/20  [name], MD  sulfamethoxazole-trimethoprim Octavio Graves) 400-80 MG tablet Take 1 tablet by mouth every Monday, Wednesday, and Friday.    [provider]  tacrolimus (PROGRAF) 0.5 MG capsule Take 0.5 mg by mouth 2 (two) times daily.  08/05/17   [provider]  tadalafil (CIALIS) 5 MG tablet Take 5 mg by mouth at bedtime.  02/13/18   [provider]  VASCEPA 1 g capsule TAKE 2 CAPSULES BY MOUTH 2 TIMES DAILY. Patient taking differently: Take 2 g by mouth in the morning and at bedtime.  01/25/20   [name], MD    Allergies    Contrast media [iodinated diagnostic agents], Penicillins, and Lisinopril  Review of Systems   Review of Systems  Constitutional: Negative for chills and fever.  HENT: Negative for ear pain and sore throat.   Eyes: Negative for pain and visual disturbance.  Respiratory: Negative for cough and shortness of breath.   Cardiovascular: Negative for chest pain and palpitations.  Gastrointestinal: Negative for abdominal pain and vomiting.  Genitourinary: Negative for dysuria and hematuria.  Musculoskeletal: Positive for arthralgias. Negative for back pain.  Skin: Negative for color change and rash.  Neurological: Negative for seizures and syncope.  All other systems reviewed and are negative.   Physical Exam Updated Vital Signs BP 133/81 (BP Location: Right Arm)   Pulse (!) 46   Temp 98.2 F (36.8 C) (Oral)   Resp 18   Wt 95.3 kg   SpO2 97%   BMI 31.93 kg/m   Physical  Exam Vitals and nursing note reviewed.  Constitutional:      Appearance: Normal appearance.  HENT:     Head: Normocephalic and atraumatic.     Nose: Nose normal.     Mouth/Throat:     Mouth: Mucous  membranes are moist.  Eyes:     Extraocular Movements: Extraocular movements intact.     Pupils: Pupils are equal, round, and reactive to light.  Musculoskeletal:     Comments: RLE: 3cm area of ecchymosis over anterior leg, no other deformity or ttp noted, normal dp/pt pulse RUE: small echymosis and TTP over 5th metacarpal bone, normal cap refill, normal radial pulse, sensation, motor intact  Skin:    General: Skin is warm and dry.  Neurological:     Mental Status: He is alert.     ED Results / Procedures / Treatments   Labs (all labs ordered are listed, but only abnormal results are displayed) Labs Reviewed - No data to display  EKG None  Radiology DG Tibia/Fibula Right  Result Date: 05/05/2020 CLINICAL DATA:  Pain following fall EXAM: RIGHT TIBIA AND FIBULA - 2 VIEW COMPARISON:  None. FINDINGS: Frontal and lateral views were obtained. No fracture or dislocation. No abnormal periosteal reaction. Joint spaces appear unremarkable. There is spurring along the anterior superior patella. There are surgical clips posterior and medial to the upper to mid tibia. There are foci of arterial vascular calcification in the trifurcation arteries. There is a posterior calcaneal spur. IMPRESSION: No fracture or dislocation. No appreciable arthropathy. Probable distal patellar tendinosis. Postoperative changes as well as foci of arterial vascular calcification noted. Electronically Signed   By: Lowella Grip III M.D.   On: 05/05/2020 14:33   DG Hand Complete Right  Result Date: 05/05/2020 CLINICAL DATA:  Pain following fall EXAM: RIGHT HAND - COMPLETE 3+ VIEW COMPARISON:  None. FINDINGS: Frontal, oblique, and lateral views were obtained. There is no fracture or dislocation. There is osteoarthritic  change in the first MCP joint. Other joint spaces appear unremarkable. No erosive change. IMPRESSION: Osteoarthritic change first MCP joint. Other joint spaces appear unremarkable. No fracture or dislocation. Electronically Signed   By: Lowella Grip III M.D.   On: 05/05/2020 14:31    Procedures Procedures (including critical care time)  Medications Ordered in ED Medications  acetaminophen (TYLENOL) tablet 650 mg (650 mg Oral Given 05/05/20 1352)    ED Course  I have reviewed the triage vital signs and the nursing notes.  Pertinent labs & imaging results that were available during my care of the patient were reviewed by me and considered in my medical decision making (see chart for details).    MDM Rules/Calculators/A&P                      59 year old male presenting to ER with concern for right hand pain, right shin pain after fall.  No other trauma by history or on exam. Neurovascularly intact. X-rays negative.  Suspect MSK strain.  Cannot take NSAIDs.  Will give short Rx for Percocet.    After the discussed management above, the patient was determined to be safe for discharge.  The patient was in agreement with this plan and all questions regarding their care were answered.  ED return precautions were discussed and the patient will return to the ED with any significant worsening of condition.   Final Clinical Impression(s) / ED Diagnoses Final diagnoses:  Fall, initial encounter  Leg hematoma, left, initial encounter    Rx / DC Orders ED Discharge Orders         Ordered    oxyCODONE-acetaminophen (PERCOCET) 5-325 MG tablet  Every 4 hours PRN     05/05/20 1523  Lucrezia Starch, MD 05/06/20 [phone]

## 2020-05-05 NOTE — ED Notes (Signed)
fell last pm   c/o pain and bruising to rt shin,  Pain and swelling to rt hand  Ice applied to both

## 2020-05-10 DIAGNOSIS — I129 Hypertensive chronic kidney disease with stage 1 through stage 4 chronic kidney disease, or unspecified chronic kidney disease: Secondary | ICD-10-CM | POA: Diagnosis not present

## 2020-05-10 DIAGNOSIS — I4891 Unspecified atrial fibrillation: Secondary | ICD-10-CM | POA: Diagnosis not present

## 2020-05-10 DIAGNOSIS — Z940 Kidney transplant status: Secondary | ICD-10-CM | POA: Diagnosis not present

## 2020-05-17 ENCOUNTER — Emergency Department (HOSPITAL_BASED_OUTPATIENT_CLINIC_OR_DEPARTMENT_OTHER): Payer: 59

## 2020-05-17 ENCOUNTER — Emergency Department (HOSPITAL_BASED_OUTPATIENT_CLINIC_OR_DEPARTMENT_OTHER)
Admission: EM | Admit: 2020-05-17 | Discharge: 2020-05-17 | Disposition: A | Discharge status: Home / Self Care | Payer: 59 | Attending: Emergency Medicine | Admitting: Emergency Medicine

## 2020-05-17 ENCOUNTER — Other Ambulatory Visit: Payer: Self-pay

## 2020-05-17 ENCOUNTER — Encounter (HOSPITAL_BASED_OUTPATIENT_CLINIC_OR_DEPARTMENT_OTHER): Payer: Self-pay | Admitting: Emergency Medicine

## 2020-05-17 DIAGNOSIS — Y998 Other external cause status: Secondary | ICD-10-CM | POA: Insufficient documentation

## 2020-05-17 DIAGNOSIS — Y9289 Other specified places as the place of occurrence of the external cause: Secondary | ICD-10-CM | POA: Insufficient documentation

## 2020-05-17 DIAGNOSIS — Z23 Encounter for immunization: Secondary | ICD-10-CM | POA: Diagnosis not present

## 2020-05-17 DIAGNOSIS — W19XXXA Unspecified fall, initial encounter: Secondary | ICD-10-CM | POA: Insufficient documentation

## 2020-05-17 DIAGNOSIS — M79661 Pain in right lower leg: Secondary | ICD-10-CM | POA: Insufficient documentation

## 2020-05-17 DIAGNOSIS — Z7901 Long term (current) use of anticoagulants: Secondary | ICD-10-CM | POA: Insufficient documentation

## 2020-05-17 DIAGNOSIS — Z955 Presence of coronary angioplasty implant and graft: Secondary | ICD-10-CM | POA: Insufficient documentation

## 2020-05-17 DIAGNOSIS — S8991XA Unspecified injury of right lower leg, initial encounter: Secondary | ICD-10-CM | POA: Diagnosis not present

## 2020-05-17 DIAGNOSIS — L03115 Cellulitis of right lower limb: Secondary | ICD-10-CM | POA: Insufficient documentation

## 2020-05-17 DIAGNOSIS — Y9389 Activity, other specified: Secondary | ICD-10-CM | POA: Diagnosis not present

## 2020-05-17 DIAGNOSIS — I1310 Hypertensive heart and chronic kidney disease without heart failure, with stage 1 through stage 4 chronic kidney disease, or unspecified chronic kidney disease: Secondary | ICD-10-CM | POA: Insufficient documentation

## 2020-05-17 DIAGNOSIS — N183 Chronic kidney disease, stage 3 unspecified: Secondary | ICD-10-CM | POA: Insufficient documentation

## 2020-05-17 DIAGNOSIS — Z87891 Personal history of nicotine dependence: Secondary | ICD-10-CM | POA: Insufficient documentation

## 2020-05-17 DIAGNOSIS — R600 Localized edema: Secondary | ICD-10-CM | POA: Diagnosis not present

## 2020-05-17 DIAGNOSIS — I4891 Unspecified atrial fibrillation: Secondary | ICD-10-CM | POA: Insufficient documentation

## 2020-05-17 DIAGNOSIS — M79604 Pain in right leg: Secondary | ICD-10-CM

## 2020-05-17 MED ORDER — TETANUS-DIPHTH-ACELL PERTUSSIS 5-2.5-18.5 LF-MCG/0.5 IM SUSP
0.5000 mL | Freq: Once | INTRAMUSCULAR | Status: AC
  Administered 2020-05-17: 0.5 mL via INTRAMUSCULAR

## 2020-05-17 MED ORDER — DOXYCYCLINE HYCLATE 100 MG PO CAPS: 100.0000 mg | ORAL_CAPSULE | Freq: Two times a day (BID) | ORAL | 0 refills | Status: AC

## 2020-05-17 MED ORDER — TETANUS-DIPHTH-ACELL PERTUSSIS 5-2.5-18.5 LF-MCG/0.5 IM SUSP
INTRAMUSCULAR | Status: AC
  Filled 2020-05-17: qty 0.5

## 2020-05-17 MED FILL — MYCOPHENOLATE SODIUM 180 MG: 180 | 30 days supply | Qty: 240 | Fill #3

## 2020-05-17 MED FILL — predniSONE 5 MG TABS: 5 | 30 days supply | Qty: 30 | Fill #1

## 2020-05-17 MED FILL — DOXYCYCLINE HYCLATE 100 MG: 100 | 7 days supply | Qty: 14 | Fill #0

## 2020-05-17 NOTE — ED Triage Notes (Signed)
Pt here with right leg pain and swelling since fall two weeks ago. Pain has gotten worse.

## 2020-05-17 NOTE — ED Provider Notes (Signed)
[facility] EMERGENCY DEPARTMENT Provider Note   CSN: [id number] Arrival date & time: 05/17/20  1345     History Chief Complaint  Patient presents with  . Leg Pain    Mendy Lapinsky Weider is a 59 y.o. male.  The history is provided by the patient and medical records. No language interpreter was used.  Leg Pain Location:  Leg Time since incident:  2 weeks Injury: yes   Mechanism of injury: fall   Fall:    Point of impact:  Hands and feet   Entrapped after fall: no   Leg location:  R lower leg Pain details:    Quality:  Aching   Radiates to:  Does not radiate Chronicity:  New Tetanus status:  Unknown Prior injury to area:  Yes Ineffective treatments:  None tried Associated symptoms: swelling   Associated symptoms: no back pain, no fatigue, no fever, no neck pain and no numbness        Past Medical History:  Diagnosis Date  . Arteriovenous fistula, acquired ([place])   . Atrial fibrillation and flutter ([place])   . Bladder stones   . BMI 31.0-31.9,adult   . CAD (coronary artery disease)    a. s/p CABG 03/2012: LIMA to LAD, free RIMA to OM1, SVG to D1, sequential SVG to AM and LPLB2, EVH via right thigh and leg.  . Cellulitis 04/13/2012   RLE saphenous vein harvest incision   . CKD (chronic kidney disease)   . Dyslipidemia   . Gout   . History of kidney transplant   . Hyperglycemia   . Hyperlipidemia   . Hyperparathyroidism   . Hypertension   . Hypertrophic cardiomyopathy ([place])   . Hypomagnesemia   . Immunosuppression ([place])   . Intermittent palpitations   . Microscopic hematuria   . Migraines   . PAF and Flutter    a. Afib post-op CABG; AFlutter 10/2012  . Peripheral vascular disease ([place])   . Post-transplant erythrocytosis   . Renal disease   . S/P living-donor kidney transplantation   . Sinus node dysfunction-post termination pause    a. >8sec in setting of aflutter 10/2012  . Sleep pattern disturbance   . Stroke [facility]) 1995; 2001   denies residual  .  Superficial vein thrombosis    a. R greater saphenous 04/2012  . Vitamin D deficiency   . VT (ventricular tachycardia) [facility])     Patient Active Problem List   Diagnosis Date Noted  . Angina pectoris ([place]) 07/27/2019  . Atrial fibrillation ([place]) 08/21/2017  . Hypomagnesemia 06/11/2017  . NSVT (nonsustained ventricular tachycardia) ([place]) 06/11/2017  . Atrial fibrillation with RVR ([place]) 06/09/2017  . Pure hypercholesterolemia   . Demand ischemia ([place])   . CAD (coronary artery disease)   . Atrial flutter ([place]) 03/01/2017  . History of renal transplant 02/16/2017  . Immunosuppressed status ([place]) 02/16/2017  . Hypertensive heart disease without CHF 02/16/2017  . Thrombocytopenia ([place]) 02/16/2017  . Long term (current) use of anticoagulants 11/02/2012  . Stroke ([place])   . S/P CABG (coronary artery bypass graft) 05/12/2012  . Smoking 01/15/2012  . CKD (chronic kidney disease), stage III 01/15/2012    Past Surgical History:  Procedure Laterality Date  . ATRIAL FIBRILLATION ABLATION N/A 08/21/2017   Procedure: Atrial Fibrillation Ablation;  Surgeon: [name], MD;  Location: [facility] CV LAB;  Service: Cardiovascular;  Laterality: N/A;  . ATRIAL FLUTTER ABLATION N/A 11/09/2012   Procedure: ATRIAL FLUTTER ABLATION;  Surgeon: [name], MD;  Location: [facility] CATH LAB;  Service: Cardiovascular;  Laterality: N/A;  . AV FISTULA PLACEMENT  2011   left forearm  . AV FISTULA PLACEMENT    . CARDIAC CATHETERIZATION  03/16/12  . CARDIOVERSION N/A 06/10/2017   Procedure: CARDIOVERSION;  Surgeon: Pixie Casino, MD;  Location: [facility] ENDOSCOPY;  Service: Cardiovascular;  Laterality: N/A;  . CARDIOVERSION N/A 10/13/2017   Procedure: CARDIOVERSION;  Surgeon: Dorothy Spark, MD;  Location: [facility] ENDOSCOPY;  Service: Cardiovascular;  Laterality: N/A;  . CARDIOVERSION N/A 10/08/2018   Procedure: CARDIOVERSION;  Surgeon: Larey Dresser, MD;  Location: [facility] ENDOSCOPY;  Service: Cardiovascular;   Laterality: N/A;  . CARDIOVERSION N/A 04/27/2019   Procedure: CARDIOVERSION;  Surgeon: Dorothy Spark, MD;  Location: [facility] ENDOSCOPY;  Service: Cardiovascular;  Laterality: N/A;  . CARDIOVERSION N/A 02/24/2020   Procedure: CARDIOVERSION;  Surgeon: Donato Heinz, MD;  Location: [facility];  Service: Cardiovascular;  Laterality: N/A;  . CORONARY ARTERY BYPASS GRAFT  03/19/2012   Procedure: CORONARY ARTERY BYPASS GRAFTING (CABG);  Surgeon: Rexene Alberts, MD;  Location: [facility];  Service: Open Heart Surgery;  Laterality: N/A;  (B) MAMMARY  . CYSTOSCOPY WITH BIOPSY N/A 08/15/2016   Procedure: CYSTOSCOPY WITH IDENTIFICATION OF RIGHT TRANSPLANTATION OF URETRAL ORFICE WITH FLUORESCEIN;  Surgeon: Carolan Clines, MD;  Location: [facility] ORS;  Service: Urology;  Laterality: N/A;  . DENTAL SURGERY  2008   multiple  . [place]   left  . FRACTURE SURGERY    . HERNIA REPAIR  57/2620   umbilical  . HOLMIUM LASER APPLICATION N/A 3/55/9741   Procedure: HOLMIUM LASER APPLICATION WITH 3 CM RIGHT BLADDER STONE;  Surgeon: Carolan Clines, MD;  Location: [facility] ORS;  Service: Urology;  Laterality: N/A;  . INGUINAL HERNIA REPAIR  09/2009   bilaterally  . KIDNEY TRANSPLANT    . LEFT HEART CATH AND CORS/GRAFTS ANGIOGRAPHY N/A 07/27/2019   Procedure: LEFT HEART CATH AND CORS/GRAFTS ANGIOGRAPHY;  Surgeon: Sherren Mocha, MD;  Location: [facility] CV LAB;  Service: Cardiovascular;  Laterality: N/A;  . LEFT HEART CATHETERIZATION WITH CORONARY ANGIOGRAM N/A 03/16/2012   Procedure: LEFT HEART CATHETERIZATION WITH CORONARY ANGIOGRAM;  Surgeon: Sherren Mocha, MD;  Location: [facility] CATH LAB;  Service: Cardiovascular;  Laterality: N/A;  . RENAL ARTERY STENT  2001  . TEE WITHOUT CARDIOVERSION N/A 08/21/2017   Procedure: TRANSESOPHAGEAL ECHOCARDIOGRAM (TEE);  Surgeon: Larey Dresser, MD;  Location: [facility] ENDOSCOPY;  Service: Cardiovascular;  Laterality: N/A;  . TRANSURETHRAL RESECTION OF BLADDER TUMOR  N/A 08/15/2016   Procedure: TRANSURETHRAL BIOPSY OF RIGHT BLADDER WALL;  Surgeon: Carolan Clines, MD;  Location: [facility] ORS;  Service: Urology;  Laterality: N/A;       Family History  Adopted: Yes  Problem Relation Age of Onset  . Hypertension Other     Social History   Tobacco Use  . Smoking status: Former Smoker    Packs/day: 0.30    Years: 30.00    Pack years: 9.00    Types: Cigarettes    Quit date: 03/15/2012    Years since quitting: 8.1  . Smokeless tobacco: Never Used  Vaping Use  . Vaping Use: Never used  Substance Use Topics  . Alcohol use: Yes    Comment: occ  . Drug use: No    Home Medications Prior to Admission medications   Medication Sig Start Date End Date Taking? Authorizing Provider  allopurinol (ZYLOPRIM) 100 MG tablet Take 200 mg by mouth daily.     [provider]  amiodarone (PACERONE) 200 MG tablet Take 1 tablet (200 mg total) by mouth daily. 04/03/20   [name], MD  cloNIDine (CATAPRES) 0.1 MG tablet Take 1 tablet (0.1 mg total) by mouth daily. Patient taking differently: Take 0.1 mg by mouth every evening.  07/26/19   [name], MD  diltiazem (CARDIZEM) 60 MG tablet Take 1 tablet as needed every 6 hours for rapid afib HR over 100. Patient taking differently: Take 60 mg by mouth every 6 (six) hours as needed (rapid afib HR over 100).  06/30/19   [name], MD  ELIQUIS 5 MG TABS tablet TAKE 1 TABLET BY MOUTH TWICE A DAY Patient taking differently: Take 5 mg by mouth 2 (two) times daily.  11/29/19   [name], MD  fenofibrate micronized (LOFIBRA) 134 MG capsule Take 134 mg by mouth daily.     [provider]  magnesium oxide (MAG-OX) 400 (241.3 Mg) MG tablet Take 400 mg by mouth 2 (two) times daily.    [provider]  metoprolol tartrate (LOPRESSOR) 50 MG tablet Take 1 tablet (50 mg total) by mouth 2 (two) times daily. 07/26/19 02/17/22  [name], MD  mycophenolate (MYFORTIC) 180 MG EC tablet Take 720  mg by mouth 2 (two) times daily.     [provider]  omeprazole (PRILOSEC) 20 MG capsule Take 20 mg by mouth daily.    [provider]  predniSONE (DELTASONE) 5 MG tablet Take 5 mg by mouth at bedtime.    [provider]  rosuvastatin (CRESTOR) 20 MG tablet Take 1 tablet (20 mg total) by mouth daily. Patient taking differently: Take 20 mg by mouth 3 (three) times a week.  03/12/19 03/11/20  [name], MD  sulfamethoxazole-trimethoprim Octavio Graves) 400-80 MG tablet Take 1 tablet by mouth every Monday, Wednesday, and Friday.    [provider]  tacrolimus (PROGRAF) 0.5 MG capsule Take 0.5 mg by mouth 2 (two) times daily.  08/05/17   [provider]  tadalafil (CIALIS) 5 MG tablet Take 5 mg by mouth at bedtime.  02/13/18   [provider]  VASCEPA 1 g capsule TAKE 2 CAPSULES BY MOUTH 2 TIMES DAILY. Patient taking differently: Take 2 g by mouth in the morning and at bedtime.  01/25/20   [name], MD    Allergies    Contrast media [iodinated diagnostic agents], Penicillins, and Lisinopril  Review of Systems   Review of Systems  Constitutional: Negative for chills, diaphoresis, fatigue and fever.  HENT: Negative for congestion.   Respiratory: Negative for cough, chest tightness, shortness of breath and wheezing.   Cardiovascular: Negative for chest pain, palpitations and leg swelling.  Gastrointestinal: Negative for abdominal pain, constipation, diarrhea, nausea, rectal pain and vomiting.  Genitourinary: Negative for flank pain and frequency.  Musculoskeletal: Negative for back pain, neck pain and neck stiffness.  Skin: Positive for color change (r shin is bruised and red).  Neurological: Negative for dizziness, light-headedness and numbness.  Psychiatric/Behavioral: Negative for agitation.  All other systems reviewed and are negative.   Physical Exam Updated Vital Signs BP 129/84 (BP Location: Left Arm)   Pulse (!) 58    Temp 98.4 F (36.9 C) (Oral)   Resp 16   Ht 5\' 8"  (1.727 m)   Wt 94.9 kg   SpO2 98%   BMI 31.81 kg/m   Physical Exam Vitals and nursing note reviewed.  Constitutional:      General:  He is not in acute distress.    Appearance: He is well-developed. He is not ill-appearing, toxic-appearing or diaphoretic.  HENT:     Head: Normocephalic and atraumatic.     Nose: No congestion or rhinorrhea.  Eyes:     Conjunctiva/sclera: Conjunctivae normal.  Cardiovascular:     Rate and Rhythm: Normal rate and regular rhythm.     Heart sounds: No murmur heard.   Pulmonary:     Effort: Pulmonary effort is normal. No respiratory distress.     Breath sounds: Normal breath sounds.  Abdominal:     Palpations: Abdomen is soft.     Tenderness: There is no abdominal tenderness. There is no right CVA tenderness, left CVA tenderness, guarding or rebound.  Musculoskeletal:        General: Swelling and tenderness present.     Cervical back: Neck supple. No tenderness.     Right lower leg: Tenderness present. Edema present.     Left lower leg: No edema.       Legs:  Skin:    General: Skin is warm and dry.     Capillary Refill: Capillary refill takes less than 2 seconds.     Findings: Bruising and erythema present.  Neurological:     General: No focal deficit present.     Mental Status: He is alert.  Psychiatric:        Mood and Affect: Mood normal.     ED Results / Procedures / Treatments   Labs (all labs ordered are listed, but only abnormal results are displayed) Labs Reviewed - No data to display  EKG None  Radiology DG Tibia/Fibula Right  Result Date: 05/17/2020 CLINICAL DATA:  Pain following recent fall EXAM: RIGHT TIBIA AND FIBULA - 2 VIEW COMPARISON:  May 05, 2020 FINDINGS: Frontal and lateral views obtained. No fracture or dislocation. Postoperative changes noted medially. There are foci of arterial vascular calcification. There is spurring along the anterior, superior patella,  stable. No appreciable joint space narrowing or erosion. A spur arises from the posterior calcaneus. Ankle mortise appears intact. IMPRESSION: No fracture or dislocation. Stable patellar spurring. There is a spur arising from the posterior calcaneus. No appreciable change compared to recent prior study. Electronically Signed   By: Lowella Grip III M.D.   On: 05/17/2020 15:57   US Venous Img Lower Unilateral Right  Result Date: 05/17/2020 CLINICAL DATA:  Pain and edema following recent fall EXAM: RIGHT LOWER EXTREMITY VENOUS DUPLEX ULTRASOUND TECHNIQUE: Gray-scale sonography with graded compression, as well as color Doppler and duplex ultrasound were performed to evaluate the right lower extremity deep venous system from the level of the common femoral vein and including the common femoral, femoral, profunda femoral, popliteal and calf veins including the posterior tibial, peroneal and gastrocnemius veins when visible. The superficial great saphenous vein was also interrogated. Spectral Doppler was utilized to evaluate flow at rest and with distal augmentation maneuvers in the common femoral, femoral and popliteal veins. COMPARISON:  None. FINDINGS: Contralateral Common Femoral Vein: Respiratory phasicity is normal and symmetric with the symptomatic side. No evidence of thrombus. Normal compressibility. Common Femoral Vein: No evidence of thrombus. Normal compressibility, respiratory phasicity and response to augmentation. Saphenofemoral Junction: No evidence of thrombus. Normal compressibility and flow on color Doppler imaging. Profunda Femoral Vein: No evidence of thrombus. Normal compressibility and flow on color Doppler imaging. Femoral Vein: No evidence of thrombus. Normal compressibility, respiratory phasicity and response to augmentation. Popliteal Vein: No evidence of thrombus. Normal compressibility,  respiratory phasicity and response to augmentation. Calf Veins: No evidence of thrombus. Normal  compressibility and flow on color Doppler imaging. Superficial Great Saphenous Vein: No evidence of thrombus. Normal compressibility. Venous Reflux:  None. Other Findings:  None. IMPRESSION: No evidence of deep venous thrombosis in the right lower extremity. Left common femoral vein also patent. Electronically Signed   By: Lowella Grip III M.D.   On: 05/17/2020 15:54    Procedures Procedures (including critical care time)  Medications Ordered in ED Medications  Tdap (BOOSTRIX) injection 0.5 mL (has no administration in time range)  Tdap (BOOSTRIX) 5-2.5-18.5 LF-MCG/0.5 injection (has no administration in time range)    ED Course  I have reviewed the triage vital signs and the nursing notes.  Pertinent labs & imaging results that were available during my care of the patient were reviewed by me and considered in my medical decision making (see chart for details).    MDM Rules/Calculators/A&P                          FAUSTINO LUECKE is a 59 y.o. male with a past medical history significant for CAD status post CABG, CKD status post kidney transplant now immunosuppression, atrial fibrillation on Eliquis and hypertension who presents with right leg pain and swelling.  Patient reports that 2 weeks ago he had a mechanical fall in the dark hurting his right hand and his right shin.  He was seen and had x-rays at the time that were reassuring with no fractures discovered.  Patient reports that he was doing well until approximately 5 days ago, he started having worsening pain in his right lower leg.  He reports the pain is in the shin and the sides of his lower leg.  He denies numbness, tingling, or weakness but reports there is worsened pain that is moderate to severe.  He reports it has become more swollen and somewhat more red.  His wife told him to come to the emergency department for evaluation and possible treatment of cellulitis.  He has no history of DVT or PE and denies any chest pain,  shortness of breath, or fatigue.  On exam, patient does have some bruising and erythema to his right anterior shin.  There is some tenderness on the anterior and lateral sides.  Minimal tenderness in the calf.  Patient has good DP and PT pulse on the right as well as normal sensation and strength in the foot.  No other tenderness in the knee or hips.  Patient has bruising on his right hand which she reports is doing much better.  Lungs clear and chest nontender.  Abdomen nontender.  We had a shared decision made conversation that work-up and we agreed to rule out DVT given the trauma and despite him being on Eliquis.  We will also get x-ray to look for occult fracture that was missed on initial imaging.  If these are negative, we discussed the possibility of starting antibiotics for mild cellulitis in the setting of immunosuppression with close PCP follow-up.  Anticipate reassessment after work-up.  Ultrasound and x-ray were reassuring with no evidence of blood clot or occult fracture.  Clinically I do suspect there may be a deep hematoma due to his blood thinner use but there is no evidence of compartment syndrome given his good sensation, strength, pulses, lack of follicular thermia, and lack of pallor.  Given the patient's concern for possible cellulitis given the avulsion or scrape  he had 2 weeks ago, we will give him antibiotics as he is immunosuppressed.  We will also update his tetanus vaccination.  Patient will follow up with PCP and understands extremely strict return precautions.  He no other questions or concerns and was discharged in good condition.    Final Clinical Impression(s) / ED Diagnoses Final diagnoses:  Right leg pain  Cellulitis of right lower extremity    Rx / DC Orders ED Discharge Orders         Ordered    doxycycline (VIBRAMYCIN) 100 MG capsule  2 times daily     Discontinue  Reprint     05/17/20 1723          Clinical Impression: 1. Right leg pain   2.  Cellulitis of right lower extremity     Disposition: Discharge  Condition: Good  I have discussed the results, Dx and Tx plan with the pt(& family if present). He/she/they expressed understanding and agree(s) with the plan. Discharge instructions discussed at great length. Strict return precautions discussed and pt &/or family have verbalized understanding of the instructions. No further questions at time of discharge.    New Prescriptions   DOXYCYCLINE (VIBRAMYCIN) 100 MG CAPSULE    Take 1 capsule (100 mg total) by mouth 2 (two) times daily for 7 days.    Follow Up: Your PCP     [facility] [place] [facility]ERGENCY DEPARTMENT [address] [id number] KF EXMD [place] [phone]       Ceniyah Thorp, Gwenyth Allegra, MD 05/17/20 1726

## 2020-05-17 NOTE — Discharge Instructions (Signed)
Your history and exam today letter is to continue the work-up from your right leg injury to rule out blood clot and hidden fracture.  Both the ultrasound and the x-ray were reassuring and negative however based on the report that you did have a skin injury previously and are on immunosuppression, we will give antibiotics to cover a possible mild cellulitis as well as updated your tetanus shot.  I still think there could be a deep hematoma due to your blood thinner use however you had good blood flow on exam and good pulses.  Please take antibiotics, continue to elevate and ice it, and please follow-up with your primary doctor.  If any symptoms change or worsen, please return to the nearest emergency department.

## 2020-05-24 MED FILL — OMEPRAZOLE DR 20 MG CAPSULE: 20 | 30 days supply | Qty: 30 | Fill #3

## 2020-05-25 DIAGNOSIS — I484 Atypical atrial flutter: Secondary | ICD-10-CM | POA: Diagnosis not present

## 2020-05-25 DIAGNOSIS — Z79899 Other long term (current) drug therapy: Secondary | ICD-10-CM | POA: Diagnosis not present

## 2020-05-25 DIAGNOSIS — I4819 Other persistent atrial fibrillation: Secondary | ICD-10-CM | POA: Diagnosis not present

## 2020-05-25 DIAGNOSIS — I2510 Atherosclerotic heart disease of native coronary artery without angina pectoris: Secondary | ICD-10-CM | POA: Diagnosis not present

## 2020-05-25 DIAGNOSIS — I129 Hypertensive chronic kidney disease with stage 1 through stage 4 chronic kidney disease, or unspecified chronic kidney disease: Secondary | ICD-10-CM | POA: Diagnosis not present

## 2020-05-25 DIAGNOSIS — Z7901 Long term (current) use of anticoagulants: Secondary | ICD-10-CM | POA: Diagnosis not present

## 2020-05-25 DIAGNOSIS — I4891 Unspecified atrial fibrillation: Secondary | ICD-10-CM | POA: Diagnosis not present

## 2020-05-25 DIAGNOSIS — Z940 Kidney transplant status: Secondary | ICD-10-CM | POA: Diagnosis not present

## 2020-05-25 DIAGNOSIS — N189 Chronic kidney disease, unspecified: Secondary | ICD-10-CM | POA: Diagnosis not present

## 2020-05-31 ENCOUNTER — Other Ambulatory Visit (HOSPITAL_COMMUNITY): Payer: Self-pay | Admitting: Nephrology

## 2020-05-31 MED FILL — FENOFIBRATE 134 MG CAPSULE: 134 | 30 days supply | Qty: 30 | Fill #0

## 2020-05-31 MED FILL — ALLOPURINOL 100 MG TABS: 100 | 30 days supply | Qty: 60 | Fill #2

## 2020-05-31 MED FILL — SULFAMETHOXAZOLE-TMP SS TAB: 400-80 | 28 days supply | Qty: 12 | Fill #3

## 2020-05-31 MED FILL — VASCEPA 1 GM CAPSULE: 1 | 30 days supply | Qty: 120 | Fill #4

## 2020-05-31 MED FILL — ELIQUIS 5 MG TABLET: 5 | 30 days supply | Qty: 60 | Fill #0

## 2020-05-31 MED FILL — TACROLIMUS ANHYDROUS 0.5MG: 0.5 | 30 days supply | Qty: 30 | Fill #0

## 2020-06-07 MED FILL — TACROLIMUS 1 MG CAPSULE: 1 | 30 days supply | Qty: 30 | Fill #2

## 2020-06-14 MED FILL — predniSONE 5 MG TABS: 5 | 30 days supply | Qty: 30 | Fill #2

## 2020-06-14 MED FILL — MYCOPHENOLATE SODIUM 180 MG: 180 | 30 days supply | Qty: 240 | Fill #4

## 2020-06-27 IMAGING — US US EXTREM LOW VENOUS*R*
1 series · 13 of 24 positions shown · non-contrast
Comparison: None.

CLINICAL DATA: Pain and edema following recent fall

EXAM:
RIGHT LOWER EXTREMITY VENOUS DUPLEX ULTRASOUND
TECHNIQUE: Gray-scale sonography with graded compression, as well as color
Doppler and duplex ultrasound were performed to evaluate the right
lower extremity deep venous system from the level of the common
femoral vein and including the common femoral, femoral, profunda
femoral, popliteal and calf veins including the posterior tibial,
peroneal and gastrocnemius veins when visible. The superficial great
saphenous vein was also interrogated. Spectral Doppler was utilized
to evaluate flow at rest and with distal augmentation maneuvers in
the common femoral, femoral and popliteal veins.

[Series 1: us extrem low venous*right* · 13 of 30 slices shown]
[im 1/30]
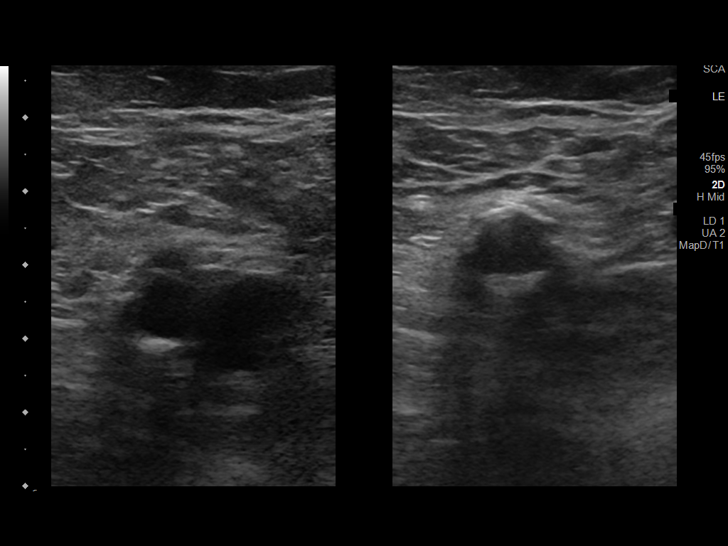
[im 3/30]
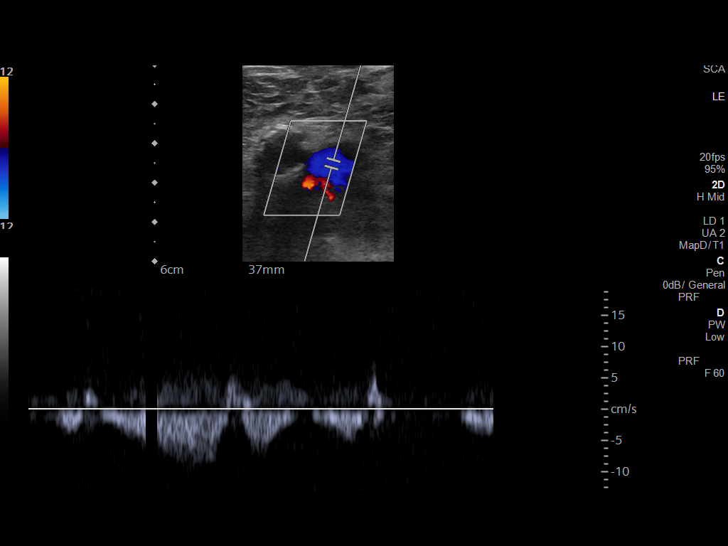
[im 6/30]
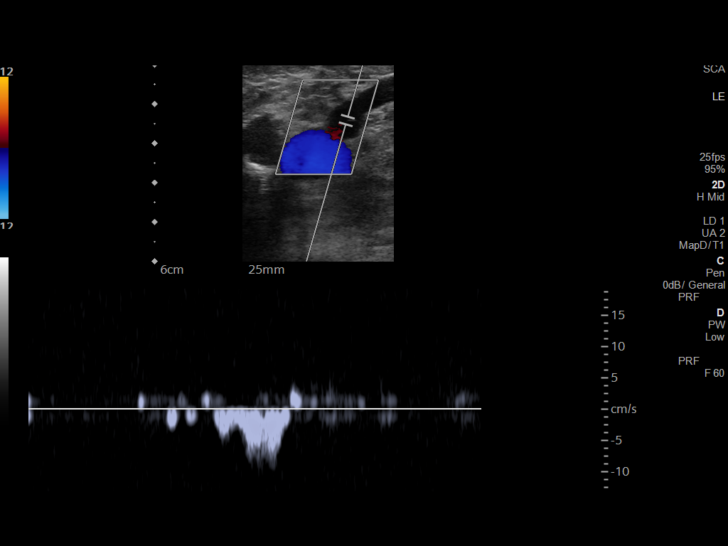
[im 8/30]
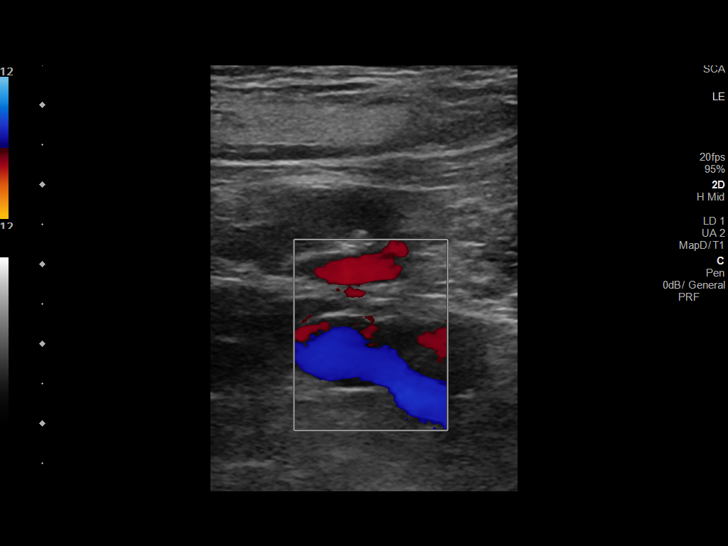
[im 11/30]
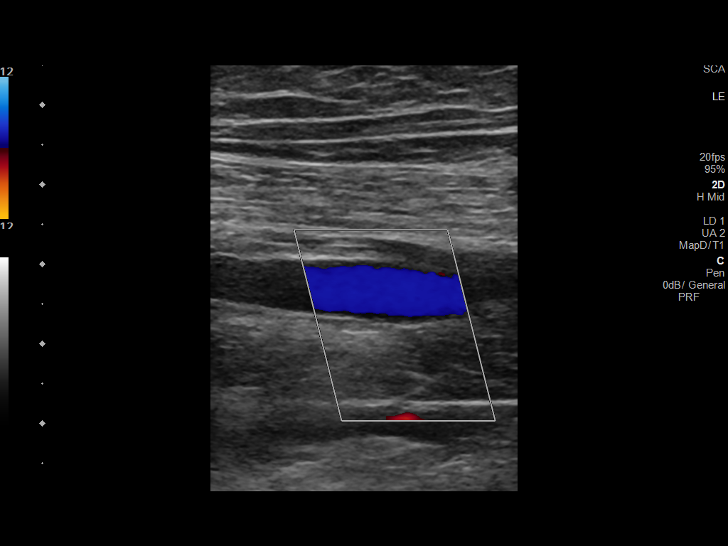
[im 13/30]
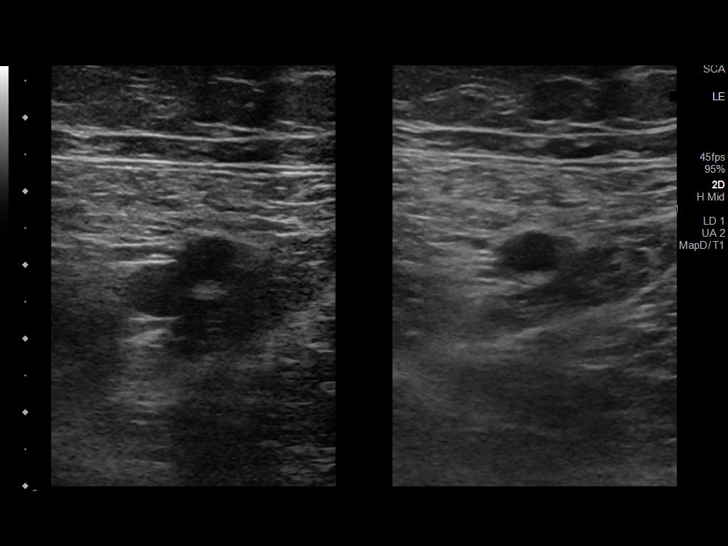
[im 16/30]
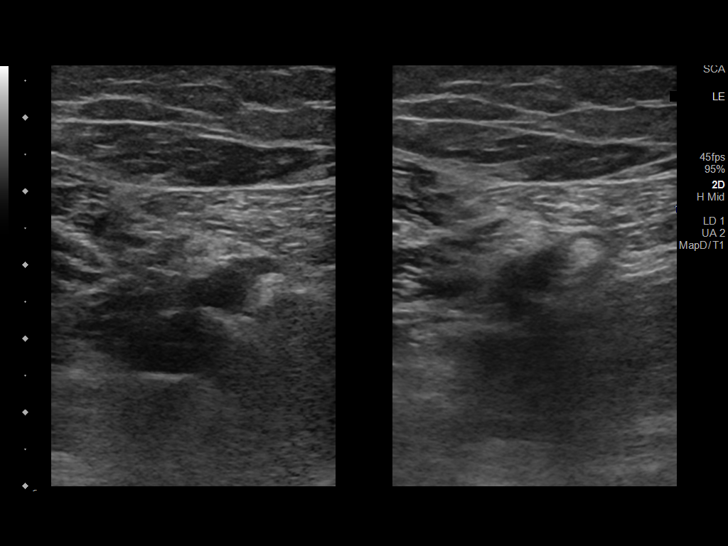
[im 17/30]
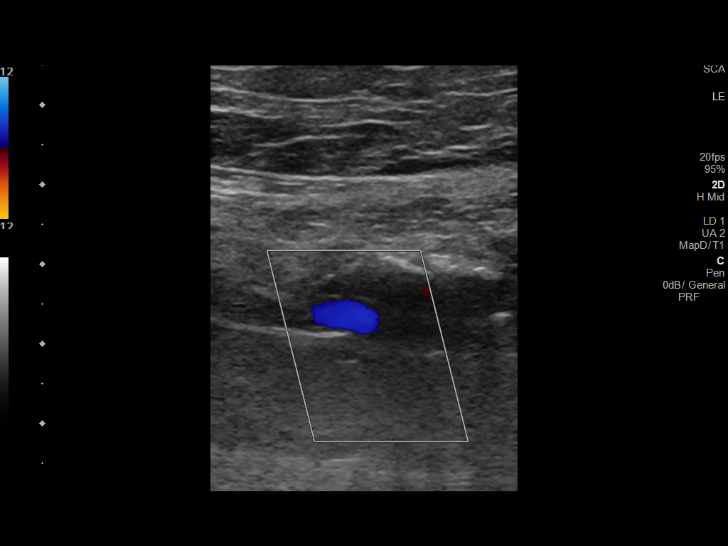
[im 19/30]
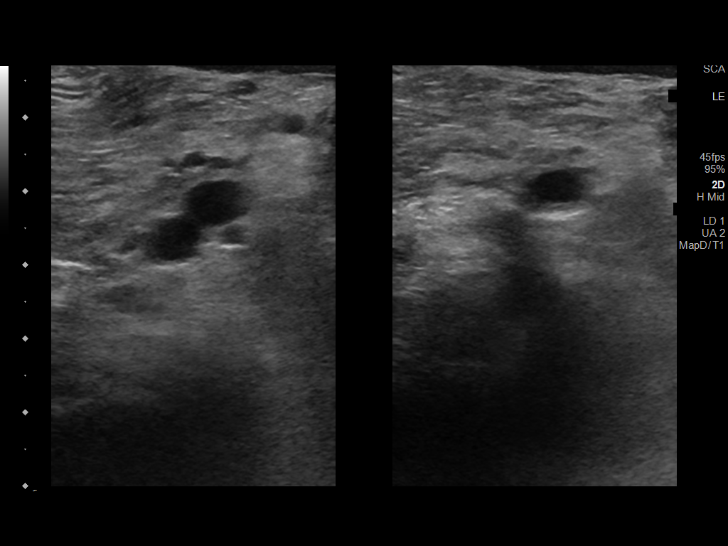
[im 22/30]
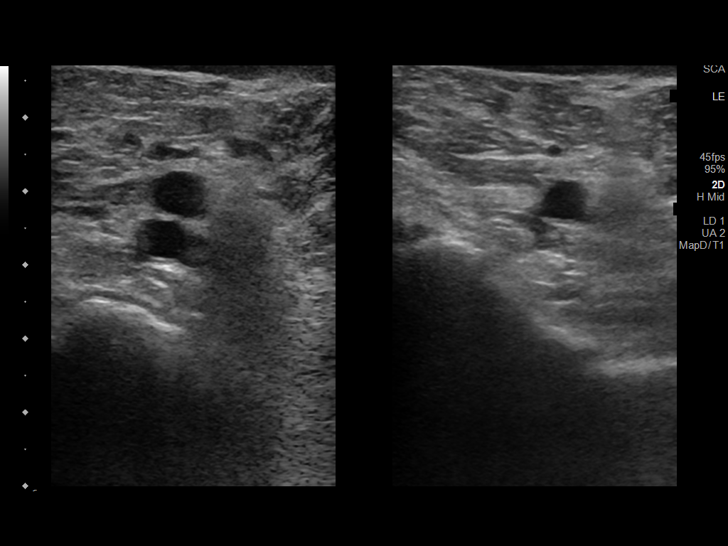
[im 24/30]
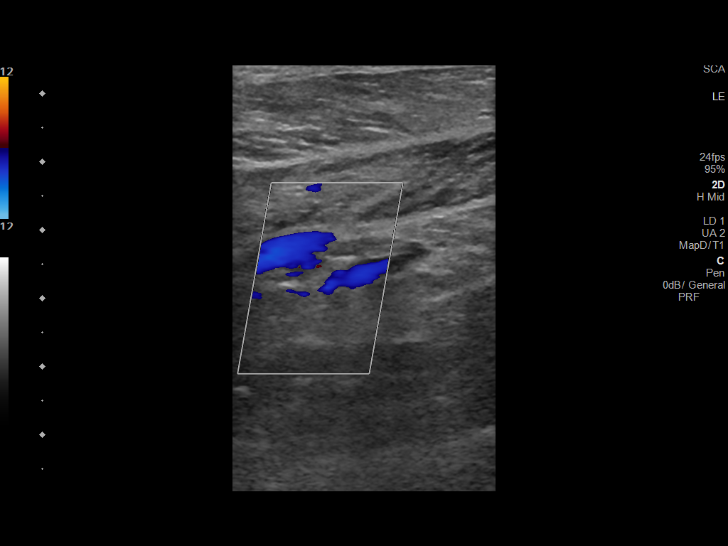
[im 27/30]
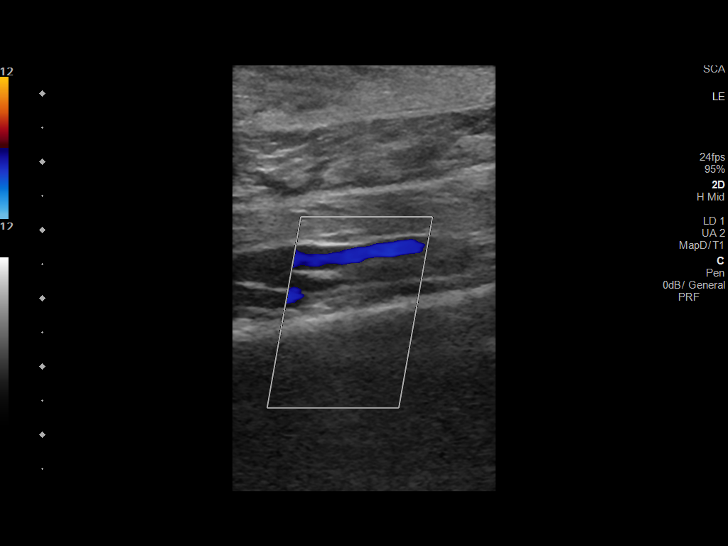
[im 30/30]
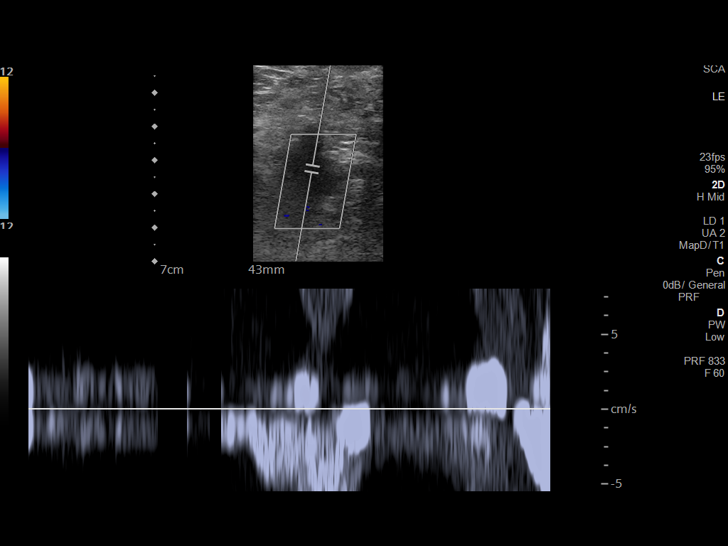

[13 of 24 positions shown; findings below may reference images not displayed]

FINDINGS: Contralateral Common Femoral Vein: Respiratory phasicity is normal
and symmetric with the symptomatic side. No evidence of thrombus.
Normal compressibility.

Common Femoral Vein: No evidence of thrombus. Normal
compressibility, respiratory phasicity and response to augmentation.

Saphenofemoral Junction: No evidence of thrombus. Normal
compressibility and flow on color Doppler imaging.

Profunda Femoral Vein: No evidence of thrombus. Normal
compressibility and flow on color Doppler imaging.

Femoral Vein: No evidence of thrombus. Normal compressibility,
respiratory phasicity and response to augmentation.

Popliteal Vein: No evidence of thrombus. Normal compressibility,
respiratory phasicity and response to augmentation.

Calf Veins: No evidence of thrombus. Normal compressibility and flow
on color Doppler imaging.

Superficial Great Saphenous Vein: No evidence of thrombus. Normal
compressibility.

Venous Reflux:  None.

Other Findings:  None.
IMPRESSION: No evidence of deep venous thrombosis in the right lower extremity.
Left common femoral vein also patent.

## 2020-06-28 ENCOUNTER — Other Ambulatory Visit: Payer: Self-pay | Admitting: Internal Medicine

## 2020-06-28 MED FILL — TACROLIMUS ANHYDROUS 0.5MG: 0.5 | 30 days supply | Qty: 30 | Fill #1

## 2020-06-28 MED FILL — VASCEPA 1 GM CAPSULE: 1 | 30 days supply | Qty: 120 | Fill #0

## 2020-06-28 MED FILL — FENOFIBRATE 134 MG CAPSULE: 134 | 30 days supply | Qty: 30 | Fill #1

## 2020-06-28 MED FILL — SULFAMETHOXAZOLE-TMP SS TAB: 400-80 | 28 days supply | Qty: 12 | Fill #4

## 2020-06-28 MED FILL — ALLOPURINOL 100 MG TABS: 100 | 30 days supply | Qty: 60 | Fill #3

## 2020-06-29 MED FILL — ELIQUIS 5 MG TABLET: 5 | 30 days supply | Qty: 60 | Fill #0

## 2020-07-05 MED FILL — TACROLIMUS 1 MG CAPSULE: 1 | 30 days supply | Qty: 30 | Fill #3

## 2020-07-05 MED FILL — TADALAFIL 5 MG TABS: 5 | 90 days supply | Qty: 90 | Fill #2

## 2020-07-12 ENCOUNTER — Other Ambulatory Visit: Payer: Self-pay | Admitting: Internal Medicine

## 2020-07-12 DIAGNOSIS — I4891 Unspecified atrial fibrillation: Secondary | ICD-10-CM | POA: Diagnosis not present

## 2020-07-12 DIAGNOSIS — I498 Other specified cardiac arrhythmias: Secondary | ICD-10-CM | POA: Diagnosis not present

## 2020-07-12 MED FILL — predniSONE 5 MG TABS: 5 | 30 days supply | Qty: 30 | Fill #3

## 2020-07-12 MED FILL — OMEPRAZOLE DR 20 MG CAPSULE: 20 | 30 days supply | Qty: 30 | Fill #4

## 2020-07-13 ENCOUNTER — Other Ambulatory Visit: Payer: Self-pay | Admitting: Internal Medicine

## 2020-07-13 MED FILL — MYCOPHENOLATE SODIUM 180 MG: 180 | 30 days supply | Qty: 240 | Fill #0

## 2020-07-13 MED FILL — METOPROLOL TARTRATE 50 MG T: 50 | 90 days supply | Qty: 180 | Fill #0

## 2020-07-19 ENCOUNTER — Other Ambulatory Visit: Payer: Self-pay | Admitting: Internal Medicine

## 2020-07-19 ENCOUNTER — Other Ambulatory Visit: Payer: Self-pay

## 2020-07-19 MED ORDER — CLONIDINE HCL 0.1 MG PO TABS: 0.1000 mg | ORAL_TABLET | Freq: Every day | ORAL | 2 refills | Status: DC

## 2020-07-19 MED FILL — CloNIDine HCL 0.1 MG TAB: 0.1 | 90 days supply | Qty: 90 | Fill #0

## 2020-07-26 MED FILL — SULFAMETHOXAZOLE-TMP SS TAB: 400-80 | 28 days supply | Qty: 12 | Fill #5

## 2020-07-26 MED FILL — FENOFIBRATE 134 MG CAPSULE: 134 | 30 days supply | Qty: 30 | Fill #2

## 2020-07-26 MED FILL — ELIQUIS 5 MG TABLET: 5 | 30 days supply | Qty: 60 | Fill #1

## 2020-07-26 MED FILL — VASCEPA 1 GM CAPSULE: 1 | 30 days supply | Qty: 120 | Fill #1

## 2020-07-27 ENCOUNTER — Other Ambulatory Visit: Payer: Self-pay

## 2020-07-27 DIAGNOSIS — Z20822 Contact with and (suspected) exposure to covid-19: Secondary | ICD-10-CM | POA: Diagnosis not present

## 2020-08-02 MED FILL — TACROLIMUS 1 MG CAPSULE: 1 | 30 days supply | Qty: 30 | Fill #4

## 2020-08-09 ENCOUNTER — Other Ambulatory Visit: Payer: Self-pay | Admitting: Internal Medicine

## 2020-08-09 MED ORDER — CLONIDINE HCL 0.1 MG PO TABS: 0.1000 mg | ORAL_TABLET | Freq: Two times a day (BID) | ORAL | 2 refills | Status: DC

## 2020-08-09 MED FILL — TACROLIMUS ANHYDROUS 0.5MG: 0.5 | 30 days supply | Qty: 30 | Fill #2

## 2020-08-09 MED FILL — OMEPRAZOLE DR 20 MG CAPSULE: 20 | 30 days supply | Qty: 30 | Fill #5

## 2020-08-09 MED FILL — ALLOPURINOL 100 MG TABS: 100 | 30 days supply | Qty: 60 | Fill #4

## 2020-08-09 MED FILL — ROSUVASTATIN CALCIUM 20 MG: 20 | 90 days supply | Qty: 90 | Fill #0

## 2020-08-09 MED FILL — predniSONE 5 MG TABS: 5 | 30 days supply | Qty: 30 | Fill #4

## 2020-08-09 NOTE — Telephone Encounter (Signed)
Dr Caryl Comes called pt on speaker phone call and advised him to stop Metoprolol and increase Clonidine to 0.1mg  bid.  Pt verbalized understanding and agrees with current plan

## 2020-08-09 NOTE — Telephone Encounter (Signed)
Will have him stop metoprolol    Increase clonidine 0.1 mg bid

## 2020-08-09 NOTE — Telephone Encounter (Signed)
Spoke with [name] who reports elevated B/P since decrease in Metoprolol dosage by Dr Renaye Rakers.  See B/P readings in MyChart messages. [name] reports when his B/P is elevated he will develop a frontal headache and feel as if he is bloated and has walked up 3 flights of stairs.  [name] denies current CP or SOB.  [name] asking if he needs to increase Metoprolol back to 50mg  bid or if another medication should be added for B/P control.   Reviewed ED protocol with [name] and advised will forward information to Dr Caryl Comes for review and recommendation.  [name] verbalizes understanding and agrees with current plan.

## 2020-08-14 DIAGNOSIS — B351 Tinea unguium: Secondary | ICD-10-CM | POA: Diagnosis not present

## 2020-08-14 DIAGNOSIS — B353 Tinea pedis: Secondary | ICD-10-CM | POA: Diagnosis not present

## 2020-08-22 DIAGNOSIS — L812 Freckles: Secondary | ICD-10-CM | POA: Diagnosis not present

## 2020-08-22 DIAGNOSIS — D1801 Hemangioma of skin and subcutaneous tissue: Secondary | ICD-10-CM | POA: Diagnosis not present

## 2020-08-22 DIAGNOSIS — D2271 Melanocytic nevi of right lower limb, including hip: Secondary | ICD-10-CM | POA: Diagnosis not present

## 2020-08-22 DIAGNOSIS — D2262 Melanocytic nevi of left upper limb, including shoulder: Secondary | ICD-10-CM | POA: Diagnosis not present

## 2020-08-22 DIAGNOSIS — L821 Other seborrheic keratosis: Secondary | ICD-10-CM | POA: Diagnosis not present

## 2020-08-22 DIAGNOSIS — L820 Inflamed seborrheic keratosis: Secondary | ICD-10-CM | POA: Diagnosis not present

## 2020-08-22 DIAGNOSIS — L819 Disorder of pigmentation, unspecified: Secondary | ICD-10-CM | POA: Diagnosis not present

## 2020-08-22 DIAGNOSIS — D2261 Melanocytic nevi of right upper limb, including shoulder: Secondary | ICD-10-CM | POA: Diagnosis not present

## 2020-08-22 DIAGNOSIS — D225 Melanocytic nevi of trunk: Secondary | ICD-10-CM | POA: Diagnosis not present

## 2020-08-23 ENCOUNTER — Other Ambulatory Visit: Payer: Self-pay

## 2020-08-23 MED ORDER — AMLODIPINE BESYLATE 5 MG PO TABS
5.0000 mg | ORAL_TABLET | Freq: Every day | ORAL | 3 refills | Status: DC
Start: 2020-08-23 — End: 2020-11-16

## 2020-08-23 MED FILL — VASCEPA 1 GM CAPSULE: 1 | 30 days supply | Qty: 120 | Fill #2

## 2020-08-23 MED FILL — FENOFIBRATE 134 MG CAPSULE: 134 | 30 days supply | Qty: 30 | Fill #3

## 2020-08-23 MED FILL — AMLODIPINE BESYLATE 5 MG TA: 5 | 90 days supply | Qty: 90 | Fill #0

## 2020-08-23 MED FILL — SULFAMETHOXAZOLE-TMP SS TAB: 400-80 | 28 days supply | Qty: 12 | Fill #6

## 2020-08-23 MED FILL — MYCOPHENOLATE SODIUM 180 MG: 180 | 30 days supply | Qty: 240 | Fill #1

## 2020-08-23 NOTE — Progress Notes (Addendum)
Per Dr Caryl Comes pt should start Amlodipine 5mg  - 1 tablet daily by mouth #90 , 3RF  Rx sent to pharmacy on file and pt notified. Clonidine discontinued. Pt verbalizes understanding and agrees with current plan.

## 2020-08-23 NOTE — Addendum Note (Signed)
Addended by: Thora Lance on: 08/23/2020 10:05 AM   Modules accepted: Orders

## 2020-08-30 MED FILL — ELIQUIS 5 MG TABLET: 5 | 30 days supply | Qty: 60 | Fill #2

## 2020-08-31 ENCOUNTER — Other Ambulatory Visit: Payer: Self-pay

## 2020-08-31 ENCOUNTER — Ambulatory Visit (HOSPITAL_COMMUNITY)
Admission: RE | Admit: 2020-08-31 | Discharge: 2020-08-31 | Disposition: A | Discharge status: Home / Self Care | Payer: 59 | Source: Ambulatory Visit | Attending: Nurse Practitioner | Admitting: Nurse Practitioner

## 2020-08-31 ENCOUNTER — Other Ambulatory Visit (HOSPITAL_COMMUNITY): Payer: Self-pay | Admitting: Nurse Practitioner

## 2020-08-31 VITALS — BP 130/80 | HR 70

## 2020-08-31 DIAGNOSIS — Z8673 Personal history of transient ischemic attack (TIA), and cerebral infarction without residual deficits: Secondary | ICD-10-CM | POA: Insufficient documentation

## 2020-08-31 DIAGNOSIS — I2510 Atherosclerotic heart disease of native coronary artery without angina pectoris: Secondary | ICD-10-CM | POA: Diagnosis not present

## 2020-08-31 DIAGNOSIS — D6869 Other thrombophilia: Secondary | ICD-10-CM | POA: Diagnosis not present

## 2020-08-31 DIAGNOSIS — Z79899 Other long term (current) drug therapy: Secondary | ICD-10-CM | POA: Insufficient documentation

## 2020-08-31 DIAGNOSIS — Z7901 Long term (current) use of anticoagulants: Secondary | ICD-10-CM | POA: Diagnosis not present

## 2020-08-31 DIAGNOSIS — I4892 Unspecified atrial flutter: Secondary | ICD-10-CM | POA: Diagnosis not present

## 2020-08-31 DIAGNOSIS — Z87891 Personal history of nicotine dependence: Secondary | ICD-10-CM | POA: Insufficient documentation

## 2020-08-31 DIAGNOSIS — E785 Hyperlipidemia, unspecified: Secondary | ICD-10-CM | POA: Insufficient documentation

## 2020-08-31 DIAGNOSIS — Z940 Kidney transplant status: Secondary | ICD-10-CM | POA: Diagnosis not present

## 2020-08-31 DIAGNOSIS — I4891 Unspecified atrial fibrillation: Secondary | ICD-10-CM | POA: Insufficient documentation

## 2020-08-31 DIAGNOSIS — M109 Gout, unspecified: Secondary | ICD-10-CM | POA: Insufficient documentation

## 2020-08-31 DIAGNOSIS — I129 Hypertensive chronic kidney disease with stage 1 through stage 4 chronic kidney disease, or unspecified chronic kidney disease: Secondary | ICD-10-CM | POA: Diagnosis not present

## 2020-08-31 DIAGNOSIS — N189 Chronic kidney disease, unspecified: Secondary | ICD-10-CM | POA: Diagnosis not present

## 2020-08-31 DIAGNOSIS — E213 Hyperparathyroidism, unspecified: Secondary | ICD-10-CM | POA: Diagnosis not present

## 2020-08-31 MED ORDER — METOPROLOL TARTRATE 25 MG PO TABS
12.5000 mg | ORAL_TABLET | Freq: Two times a day (BID) | ORAL | 3 refills | Status: DC
Start: 2020-08-31 — End: 2020-09-11

## 2020-08-31 MED FILL — METOPROLOL TARTRATE 25 MG T: 25 | 30 days supply | Qty: 30 | Fill #0

## 2020-08-31 NOTE — Patient Instructions (Signed)
Metoprolol 12.5mg  twice a day

## 2020-08-31 NOTE — Progress Notes (Signed)
Primary Care Physician: Patient, No Pcp Per Referring Physician: Dr. Raylene Miyamoto R Brownrigg is a 59 y.o. male with a h/o HTN,history of kidney transplant,  CAD,s/p bypass, HCM, with h/o of atrial flutter ablation 2013 and atrial flutter/fib ablation again 2018, both with Dr. Rayann Heman and last ablation in June of this year by Dr.Hranisky. he was on amiodarone ans this was stopped after ablation. He also had bradycardia and shortness of breath so he was weaned off BB.  He is in the office today as he noted elevated heart rate over the last week. He put himself back on 25 mg metoprolol tartrate daily 2 days ago and feels improved. Amiodarone wash out  is probably occurring around this time as well. He is in SR today.   Today, he denies symptoms of palpitations, chest pain, shortness of breath, orthopnea, PND, lower extremity edema, dizziness, presyncope, syncope, or neurologic sequela. The patient is tolerating medications without difficulties and is otherwise without complaint today.   Past Medical History:  Diagnosis Date  . Arteriovenous fistula, acquired ([place])   . Atrial fibrillation and flutter ([place])   . Bladder stones   . BMI 31.0-31.9,adult   . CAD (coronary artery disease)    a. s/p CABG 03/2012: LIMA to LAD, free RIMA to OM1, SVG to D1, sequential SVG to AM and LPLB2, EVH via right thigh and leg.  . Cellulitis 04/13/2012   RLE saphenous vein harvest incision   . CKD (chronic kidney disease)   . Dyslipidemia   . Gout   . History of kidney transplant   . Hyperglycemia   . Hyperlipidemia   . Hyperparathyroidism   . Hypertension   . Hypertrophic cardiomyopathy ([place])   . Hypomagnesemia   . Immunosuppression ([place])   . Intermittent palpitations   . Microscopic hematuria   . Migraines   . PAF and Flutter    a. Afib post-op CABG; AFlutter 10/2012  . Peripheral vascular disease ([place])   . Post-transplant erythrocytosis   . Renal disease   . S/P living-donor kidney transplantation     . Sinus node dysfunction-post termination pause    a. >8sec in setting of aflutter 10/2012  . Sleep pattern disturbance   . Stroke [facility]) 1995; 2001   denies residual  . Superficial vein thrombosis    a. R greater saphenous 04/2012  . Vitamin D deficiency   . VT (ventricular tachycardia) ([place])    Past Surgical History:  Procedure Laterality Date  . ATRIAL FIBRILLATION ABLATION N/A 08/21/2017   Procedure: Atrial Fibrillation Ablation;  Surgeon: [name], MD;  Location: [facility];  Service: Cardiovascular;  Laterality: N/A;  . ATRIAL FLUTTER ABLATION N/A 11/09/2012   Procedure: ATRIAL FLUTTER ABLATION;  Surgeon: [name], MD;  Location: [facility] CATH LAB;  Service: Cardiovascular;  Laterality: N/A;  . AV FISTULA PLACEMENT  2011   left forearm  . AV FISTULA PLACEMENT    . CARDIAC CATHETERIZATION  03/16/12  . CARDIOVERSION N/A 06/10/2017   Procedure: CARDIOVERSION;  Surgeon: Pixie Casino, MD;  Location: [facility] ENDOSCOPY;  Service: Cardiovascular;  Laterality: N/A;  . CARDIOVERSION N/A 10/13/2017   Procedure: CARDIOVERSION;  Surgeon: Dorothy Spark, MD;  Location: [facility] ENDOSCOPY;  Service: Cardiovascular;  Laterality: N/A;  . CARDIOVERSION N/A 10/08/2018   Procedure: CARDIOVERSION;  Surgeon: Larey Dresser, MD;  Location: [facility] ENDOSCOPY;  Service: Cardiovascular;  Laterality: N/A;  . CARDIOVERSION N/A 04/27/2019   Procedure: CARDIOVERSION;  Surgeon: Dorothy Spark,  MD;  Location: [place];  Service: Cardiovascular;  Laterality: N/A;  . CARDIOVERSION N/A 02/24/2020   Procedure: CARDIOVERSION;  Surgeon: Donato Heinz, MD;  Location: [place];  Service: Cardiovascular;  Laterality: N/A;  . CORONARY ARTERY BYPASS GRAFT  03/19/2012   Procedure: CORONARY ARTERY BYPASS GRAFTING (CABG);  Surgeon: Rexene Alberts, MD;  Location: [place];  Service: Open Heart Surgery;  Laterality: N/A;  (B) MAMMARY  . CYSTOSCOPY WITH BIOPSY N/A 08/15/2016   Procedure: CYSTOSCOPY WITH  IDENTIFICATION OF RIGHT TRANSPLANTATION OF URETRAL ORFICE WITH FLUORESCEIN;  Surgeon: Carolan Clines, MD;  Location: [facility];  Service: Urology;  Laterality: N/A;  . DENTAL SURGERY  2008   multiple  . [place]   left  . FRACTURE SURGERY    . HERNIA REPAIR  [id number]/[id number]   umbilical  . HOLMIUM LASER APPLICATION N/A 3/87/5643   Procedure: HOLMIUM LASER APPLICATION WITH 3 CM RIGHT BLADDER STONE;  Surgeon: Carolan Clines, MD;  Location: [facility];  Service: Urology;  Laterality: N/A;  . INGUINAL HERNIA REPAIR  09/2009   bilaterally  . KIDNEY TRANSPLANT    . LEFT HEART CATH AND CORS/GRAFTS ANGIOGRAPHY N/A 07/27/2019   Procedure: LEFT HEART CATH AND CORS/GRAFTS ANGIOGRAPHY;  Surgeon: Sherren Mocha, MD;  Location: [place] [facility];  Service: Cardiovascular;  Laterality: N/A;  . LEFT HEART CATHETERIZATION WITH CORONARY ANGIOGRAM N/A 03/16/2012   Procedure: LEFT HEART CATHETERIZATION WITH CORONARY ANGIOGRAM;  Surgeon: Sherren Mocha, MD;  Location: [facility] CATH LAB;  Service: Cardiovascular;  Laterality: N/A;  . RENAL ARTERY STENT  2001  . TEE WITHOUT CARDIOVERSION N/A 08/21/2017   Procedure: TRANSESOPHAGEAL ECHOCARDIOGRAM (TEE);  Surgeon: Larey Dresser, MD;  Location: [facility] ENDOSCOPY;  Service: Cardiovascular;  Laterality: N/A;  . TRANSURETHRAL RESECTION OF BLADDER TUMOR N/A 08/15/2016   Procedure: TRANSURETHRAL BIOPSY OF RIGHT BLADDER WALL;  Surgeon: Carolan Clines, MD;  Location: [facility];  Service: Urology;  Laterality: N/A;    Current Outpatient Medications  Medication Sig Dispense Refill  . allopurinol (ZYLOPRIM) 100 MG tablet Take 200 mg by mouth daily.     Marland Kitchen amLODipine (NORVASC) 5 MG tablet Take 1 tablet (5 mg total) by mouth daily. 90 tablet 3  . diltiazem (CARDIZEM) 60 MG tablet Take 1 tablet as needed every 6 hours for rapid afib HR over 100. (Patient taking differently: Take 60 mg by mouth every 6 (six) hours as needed (rapid afib HR over 100). ) 45 tablet 1  . ELIQUIS  5 MG TABS tablet TAKE 1 TABLET BY MOUTH TWICE A DAY (Patient taking differently: Take 5 mg by mouth 2 (two) times daily. ) 60 tablet 5  . fenofibrate micronized (LOFIBRA) 134 MG capsule Take 134 mg by mouth daily.     . magnesium oxide (MAG-OX) 400 (241.3 Mg) MG tablet Take 400 mg by mouth 2 (two) times daily.    . metoprolol tartrate (LOPRESSOR) 25 MG tablet Take 0.5 tablets (12.5 mg total) by mouth 2 (two) times daily. 30 tablet 3  . mycophenolate (MYFORTIC) 180 MG EC tablet Take 720 mg by mouth 2 (two) times daily.     Marland Kitchen omeprazole (PRILOSEC) 20 MG capsule Take 20 mg by mouth daily.  5  . predniSONE (DELTASONE) 5 MG tablet Take 5 mg by mouth at bedtime.    . rosuvastatin (CRESTOR) 20 MG tablet TAKE 1 TABLET BY MOUTH DAILY. 90 tablet 2  . sulfamethoxazole-trimethoprim (BACTRIM,SEPTRA) 400-80 MG tablet Take 1 tablet by mouth every Monday, Wednesday, and  Friday.  6  . tacrolimus (PROGRAF) 0.5 MG capsule Take 0.5 mg by mouth 2 (two) times daily.   2  . tadalafil (CIALIS) 5 MG tablet Take 5 mg by mouth at bedtime.   11  . VASCEPA 1 g capsule TAKE 2 CAPSULES BY MOUTH TWICE DAILY. 120 capsule 9   No current facility-administered medications for this encounter.    Allergies  Allergen Reactions  . Contrast Media [Iodinated Diagnostic Agents] Other (See Comments)    Pt states that this medication causes him to code.   Marland Kitchen Penicillins Itching, Rash and Other (See Comments)    Has patient had a PCN reaction causing immediate rash, facial/tongue/throat swelling, SOB or lightheadedness with hypotension: No Has patient had a PCN reaction causing severe rash involving mucus membranes or skin necrosis: No Has patient had a PCN reaction that required hospitalization No Has patient had a PCN reaction occurring within the last 10 years: No If all of the above answers are "NO", then may proceed with Cephalosporin use.  Marland Kitchen Lisinopril     Increased Cr    Social History   Socioeconomic History  . Marital  status: Married    Spouse name: Not on file  . Number of children: Not on file  . Years of education: Not on file  . Highest education level: Not on file  Occupational History  . Not on file  Tobacco Use  . Smoking status: Former Smoker    Packs/day: 0.30    Years: 30.00    Pack years: 9.00    Types: Cigarettes    Quit date: 03/15/2012    Years since quitting: 8.4  . Smokeless tobacco: Never Used  Vaping Use  . Vaping Use: Never used  Substance and Sexual Activity  . Alcohol use: Yes    Comment: occ  . Drug use: No  . Sexual activity: Not on file  Other Topics Concern  . Not on file  Social History Narrative  . Not on file   Social Determinants of Health   Financial Resource Strain:   . Difficulty of Paying Living Expenses: Not on file  Food Insecurity:   . Worried About Charity fundraiser in the Last Year: Not on file  . Ran Out of Food in the Last Year: Not on file  Transportation Needs:   . Lack of Transportation (Medical): Not on file  . Lack of Transportation (Non-Medical): Not on file  Physical Activity:   . Days of Exercise per Week: Not on file  . Minutes of Exercise per Session: Not on file  Stress:   . Feeling of Stress : Not on file  Social Connections:   . Frequency of Communication with Friends and Family: Not on file  . Frequency of Social Gatherings with Friends and Family: Not on file  . Attends Religious Services: Not on file  . Active Member of Clubs or Organizations: Not on file  . Attends Archivist Meetings: Not on file  . Marital Status: Not on file  Intimate Partner Violence:   . Fear of Current or Ex-Partner: Not on file  . Emotionally Abused: Not on file  . Physically Abused: Not on file  . Sexually Abused: Not on file    Family History  Adopted: Yes  Problem Relation Age of Onset  . Hypertension Other     ROS- All systems are reviewed and negative except as per the HPI above  Physical Exam: Vitals:   08/31/20 1509  BP: 130/80  Pulse: 70   Wt Readings from Last 3 Encounters:  05/17/20 94.9 kg  05/05/20 95.3 kg  04/03/20 97 kg    Labs: Lab Results  Component Value Date   NA 140 02/15/2020   K 4.6 02/15/2020   CL 105 02/15/2020   CO2 23 02/15/2020   GLUCOSE 98 02/15/2020   BUN 21 02/15/2020   CREATININE 1.56 (H) 02/15/2020   CALCIUM 9.7 02/15/2020   PHOS 6.9 (H) 04/16/2012   MG 1.7 06/14/2017   Lab Results  Component Value Date   INR 1.07 03/01/2017   No results found for: CHOL, HDL, LDLCALC, TRIG   GEN- The patient is well appearing, alert and oriented x 3 today.   Head- normocephalic, atraumatic Eyes-  Sclera clear, conjunctiva pink Ears- hearing intact Oropharynx- clear Neck- supple, no JVP Lymph- no cervical lymphadenopathy Lungs- Clear to ausculation bilaterally, normal work of breathing Heart- Regular rate and rhythm, no murmurs, rubs or gallops, PMI not laterally displaced GI- soft, NT, ND, + BS Extremities- no clubbing, cyanosis, or edema MS- no significant deformity or atrophy Skin- no rash or lesion Psych- euthymic mood, full affect Neuro- strength and sensation are intact  EKG- Sinus rhythm at 70 bpm, pr int 158 ms, qrs int 122 ms, qtc 486 ms     Assessment and Plan: 1. afib S/p recent afib  ablation with Dr. Rodena Goldmann  05/25/20 Amiodarone was stopped at that time and is probably at washout now  He was taken off BB for bradycardia and shortness of breath after surgery Recent episodes of racing heart Pt put himself back on 25 mg metoprolol tartrate daily  which has helped and he has not had any shortness of breath or brady with this dose To have 24 hour coverage he was told to take metoprolol tartrate  12.5 mg bid was recommended  2. CHA2DS2VASc of at least 3 Continue eliquis 5 mg bid   3. HTN Stable   4. CAD  No anginal symptoms  F/u here with EKG 10/11  Butch Penny C. Anayla Giannetti, [facility] [address] [place], [place]  [id number] [phone]

## 2020-09-01 ENCOUNTER — Encounter (HOSPITAL_COMMUNITY): Payer: Self-pay | Admitting: Nurse Practitioner

## 2020-09-05 MED FILL — TACROLIMUS 1 MG CAPSULE: 1 | 30 days supply | Qty: 30 | Fill #0

## 2020-09-06 ENCOUNTER — Other Ambulatory Visit (HOSPITAL_COMMUNITY): Payer: Self-pay | Admitting: Nephrology

## 2020-09-06 MED FILL — predniSONE 5 MG TABS: 5 | 30 days supply | Qty: 30 | Fill #5

## 2020-09-06 MED FILL — TACROLIMUS ANHYDROUS 0.5MG: 0.5 | 30 days supply | Qty: 30 | Fill #3

## 2020-09-06 MED FILL — ALLOPURINOL 100 MG TABS: 100 | 30 days supply | Qty: 60 | Fill #5

## 2020-09-06 MED FILL — OMEPRAZOLE DR 20 MG CAPSULE: 20 | 30 days supply | Qty: 30 | Fill #0

## 2020-09-11 ENCOUNTER — Ambulatory Visit (HOSPITAL_COMMUNITY)
Admission: RE | Admit: 2020-09-11 | Discharge: 2020-09-11 | Disposition: A | Discharge status: Home / Self Care | Payer: 59 | Source: Ambulatory Visit | Attending: Nurse Practitioner | Admitting: Nurse Practitioner

## 2020-09-11 ENCOUNTER — Other Ambulatory Visit: Payer: Self-pay

## 2020-09-11 ENCOUNTER — Encounter (HOSPITAL_COMMUNITY): Payer: Self-pay

## 2020-09-11 VITALS — BP 136/90 | HR 76 | Ht 68.0 in | Wt 213.8 lb

## 2020-09-11 DIAGNOSIS — Z951 Presence of aortocoronary bypass graft: Secondary | ICD-10-CM | POA: Insufficient documentation

## 2020-09-11 DIAGNOSIS — I2510 Atherosclerotic heart disease of native coronary artery without angina pectoris: Secondary | ICD-10-CM | POA: Diagnosis not present

## 2020-09-11 DIAGNOSIS — I4891 Unspecified atrial fibrillation: Secondary | ICD-10-CM | POA: Insufficient documentation

## 2020-09-11 DIAGNOSIS — M109 Gout, unspecified: Secondary | ICD-10-CM | POA: Insufficient documentation

## 2020-09-11 DIAGNOSIS — I739 Peripheral vascular disease, unspecified: Secondary | ICD-10-CM | POA: Insufficient documentation

## 2020-09-11 DIAGNOSIS — Z87891 Personal history of nicotine dependence: Secondary | ICD-10-CM | POA: Diagnosis not present

## 2020-09-11 DIAGNOSIS — E213 Hyperparathyroidism, unspecified: Secondary | ICD-10-CM | POA: Insufficient documentation

## 2020-09-11 DIAGNOSIS — Z880 Allergy status to penicillin: Secondary | ICD-10-CM | POA: Insufficient documentation

## 2020-09-11 DIAGNOSIS — E785 Hyperlipidemia, unspecified: Secondary | ICD-10-CM | POA: Diagnosis not present

## 2020-09-11 DIAGNOSIS — Z91041 Radiographic dye allergy status: Secondary | ICD-10-CM | POA: Insufficient documentation

## 2020-09-11 DIAGNOSIS — I480 Paroxysmal atrial fibrillation: Secondary | ICD-10-CM

## 2020-09-11 DIAGNOSIS — I422 Other hypertrophic cardiomyopathy: Secondary | ICD-10-CM | POA: Diagnosis not present

## 2020-09-11 DIAGNOSIS — Z79899 Other long term (current) drug therapy: Secondary | ICD-10-CM | POA: Diagnosis not present

## 2020-09-11 DIAGNOSIS — Z8249 Family history of ischemic heart disease and other diseases of the circulatory system: Secondary | ICD-10-CM | POA: Diagnosis not present

## 2020-09-11 DIAGNOSIS — Z7901 Long term (current) use of anticoagulants: Secondary | ICD-10-CM | POA: Diagnosis not present

## 2020-09-11 DIAGNOSIS — Z888 Allergy status to other drugs, medicaments and biological substances status: Secondary | ICD-10-CM | POA: Insufficient documentation

## 2020-09-11 DIAGNOSIS — I10 Essential (primary) hypertension: Secondary | ICD-10-CM | POA: Diagnosis not present

## 2020-09-11 DIAGNOSIS — I4892 Unspecified atrial flutter: Secondary | ICD-10-CM | POA: Insufficient documentation

## 2020-09-11 DIAGNOSIS — Z8673 Personal history of transient ischemic attack (TIA), and cerebral infarction without residual deficits: Secondary | ICD-10-CM | POA: Diagnosis not present

## 2020-09-11 DIAGNOSIS — Z940 Kidney transplant status: Secondary | ICD-10-CM | POA: Diagnosis not present

## 2020-09-11 DIAGNOSIS — Z86718 Personal history of other venous thrombosis and embolism: Secondary | ICD-10-CM | POA: Insufficient documentation

## 2020-09-11 MED ORDER — AMIODARONE HCL 200 MG PO TABS: 200.0000 mg | ORAL_TABLET | Freq: Two times a day (BID) | ORAL | 3 refills | Status: DC

## 2020-09-12 ENCOUNTER — Encounter (HOSPITAL_COMMUNITY): Payer: Self-pay | Admitting: Nurse Practitioner

## 2020-09-12 NOTE — Progress Notes (Signed)
Primary Care Physician: Patient, No Pcp Per Referring Physician: Dr. Raylene Haley [name] is a 59 y.o. male with a h/o HTN,history of kidney transplant,  CAD,s/p bypass, HCM, with h/o of atrial flutter ablation 2013 and atrial flutter/fib ablation again 2018, both with Dr. Rayann Haley and last ablation in June of this year by StevenHranisky. he was on amiodarone ans this was stopped after ablation. He also had bradycardia and shortness of breath so he was weaned off BB.  He is in the office today as he noted elevated heart rate over the last week. He put himself back on 25 mg metoprolol tartrate daily 2 days ago and feels improved. Amiodarone wash out  is probably occurring around this time as well. He is in SR today.   F/u in afib clinic, 10/11. He is in SR but very irregular with a wandering PM, PAC's some of which are unconducted, and some premature junctional beats. I reviewed his apple strips and the strips that were classified, afib, actually look just like today's EKG. He increased his metoprolol back to 50 mg bid and added back clonidine and amlodipine as his BP has also been running higher. The irregular rhythm is worrsione for the pt.  I discussed with [name] and since  he does not have any other options due to HCM, he was in favor of restarting amiodarone.  Today, he denies symptoms of palpitations, chest pain, shortness of breath, orthopnea, PND, lower extremity edema, dizziness, presyncope, syncope, or neurologic sequela. The patient is tolerating medications without difficulties and is otherwise without complaint today.   Past Medical History:  Diagnosis Date  . Arteriovenous fistula, acquired ([place])   . Atrial fibrillation and flutter ([place])   . Bladder stones   . BMI 31.0-31.9,adult   . CAD (coronary artery disease)    a. s/p CABG 03/2012: LIMA to LAD, free RIMA to OM1, SVG to D1, sequential SVG to AM and LPLB2, EVH via right thigh and leg.  . Cellulitis 04/13/2012   RLE saphenous  vein harvest incision   . CKD (chronic kidney disease)   . Dyslipidemia   . Gout   . History of kidney transplant   . Hyperglycemia   . Hyperlipidemia   . Hyperparathyroidism   . Hypertension   . Hypertrophic cardiomyopathy ([place])   . Hypomagnesemia   . Immunosuppression ([place])   . Intermittent palpitations   . Microscopic hematuria   . Migraines   . PAF and Flutter    a. Afib post-op CABG; AFlutter 10/2012  . Peripheral vascular disease ([place])   . Post-transplant erythrocytosis   . Renal disease   . S/P living-donor kidney transplantation   . Sinus node dysfunction-post termination pause    a. >8sec in setting of aflutter 10/2012  . Sleep pattern disturbance   . Stroke [facility]) 1995; 2001   denies residual  . Superficial vein thrombosis    a. R greater saphenous 04/2012  . Vitamin D deficiency   . VT (ventricular tachycardia) ([place])    Past Surgical History:  Procedure Laterality Date  . ATRIAL FIBRILLATION ABLATION N/A 08/21/2017   Procedure: Atrial Fibrillation Ablation;  Surgeon: Steven Grayer, MD;  Location: [facility] CV LAB;  Service: Cardiovascular;  Laterality: N/A;  . ATRIAL FLUTTER ABLATION N/A 11/09/2012   Procedure: ATRIAL FLUTTER ABLATION;  Surgeon: Steven Sprang, MD;  Location: [facility] CATH LAB;  Service: Cardiovascular;  Laterality: N/A;  . AV FISTULA PLACEMENT  2011   left forearm  .  AV FISTULA PLACEMENT    . CARDIAC CATHETERIZATION  03/16/12  . CARDIOVERSION N/A 06/10/2017   Procedure: CARDIOVERSION;  Surgeon: Steven Casino, MD;  Location: [facility] ENDOSCOPY;  Service: Cardiovascular;  Laterality: N/A;  . CARDIOVERSION N/A 10/13/2017   Procedure: CARDIOVERSION;  Surgeon: Steven Spark, MD;  Location[facility] ENDOSCOPY;  Service: Cardiovascular;  Laterality: N/A;  . CARDIOVERSION N/A 10/08/2018   Procedure: CARDIOVERSION;  Surgeon: Steven Dresser, MD;  Location: [facility] ENDOSCOPY;  Service: Cardiovascular;  Laterality: N/A;  . CARDIOVERSION N/A 04/27/2019   Procedure:  CARDIOVERSION;  Surgeon: Steven Spark, MD;  Location: [facility] ENDOSCOPY;  Service: Cardiovascular;  Laterality: N/A;  . CARDIOVERSION N/A 02/24/2020   Procedure: CARDIOVERSION;  Surgeon: Steven Heinz, MD;  Location: [place];  Service: Cardiovascular;  Laterality: N/A;  . CORONARY ARTERY BYPASS GRAFT  03/19/2012   Procedure: CORONARY ARTERY BYPASS GRAFTING (CABG);  Surgeon: Steven Alberts, MD;  Location: [place];  Service: Open Heart Surgery;  Laterality: N/A;  (B) MAMMARY  . CYSTOSCOPY WITH BIOPSY N/A 08/15/2016   Procedure: CYSTOSCOPY WITH IDENTIFICATION OF RIGHT TRANSPLANTATION OF URETRAL ORFICE WITH FLUORESCEIN;  Surgeon: Steven Clines, MD;  Location: [facility] ORS;  Service: Urology;  Laterality: N/A;  . DENTAL SURGERY  2008   multiple  . [place]   left  . FRACTURE SURGERY    . HERNIA REPAIR  59/5638   umbilical  . HOLMIUM LASER APPLICATION N/A 7/56/4332   Procedure: HOLMIUM LASER APPLICATION WITH 3 CM RIGHT BLADDER STONE;  Surgeon: Steven Clines, MD;  Location: [facility] ORS;  Service: Urology;  Laterality: N/A;  . INGUINAL HERNIA REPAIR  09/2009   bilaterally  . KIDNEY TRANSPLANT    . LEFT HEART CATH AND CORS/GRAFTS ANGIOGRAPHY N/A 07/27/2019   Procedure: LEFT HEART CATH AND CORS/GRAFTS ANGIOGRAPHY;  Surgeon: Steven Mocha, MD;  Location: [facility] CV LAB;  Service: Cardiovascular;  Laterality: N/A;  . LEFT HEART CATHETERIZATION WITH CORONARY ANGIOGRAM N/A 03/16/2012   Procedure: LEFT HEART CATHETERIZATION WITH CORONARY ANGIOGRAM;  Surgeon: Steven Mocha, MD;  Location: [facility] CATH LAB;  Service: Cardiovascular;  Laterality: N/A;  . RENAL ARTERY STENT  2001  . TEE WITHOUT CARDIOVERSION N/A 08/21/2017   Procedure: TRANSESOPHAGEAL ECHOCARDIOGRAM (TEE);  Surgeon: Steven Dresser, MD;  Location: [facility] ENDOSCOPY;  Service: Cardiovascular;  Laterality: N/A;  . TRANSURETHRAL RESECTION OF BLADDER TUMOR N/A 08/15/2016   Procedure: TRANSURETHRAL BIOPSY OF RIGHT BLADDER  WALL;  Surgeon: Steven Clines, MD;  Location: [facility] ORS;  Service: Urology;  Laterality: N/A;    Current Outpatient Medications  Medication Sig Dispense Refill  . allopurinol (ZYLOPRIM) 100 MG tablet Take 200 mg by mouth daily.     Marland Kitchen amLODipine (NORVASC) 5 MG tablet Take 1 tablet (5 mg total) by mouth daily. 90 tablet 3  . diltiazem (CARDIZEM) 60 MG tablet Take 1 tablet as needed every 6 hours for rapid afib HR over 100. (Patient taking differently: Take 60 mg by mouth every 6 (six) hours as needed (rapid afib HR over 100). ) 45 tablet 1  . ELIQUIS 5 MG TABS tablet TAKE 1 TABLET BY MOUTH TWICE A DAY (Patient taking differently: Take 5 mg by mouth 2 (two) times daily. ) 60 tablet 5  . fenofibrate micronized (LOFIBRA) 134 MG capsule Take 134 mg by mouth daily.     . fluticasone (FLONASE) 50 MCG/ACT nasal spray Place 1 spray into both nostrils as needed for allergies or rhinitis.    . magnesium oxide (MAG-OX)  400 (241.3 Mg) MG tablet Take 400 mg by mouth 2 (two) times daily.    . metoprolol tartrate (LOPRESSOR) 50 MG tablet Take 12.5 mg by mouth 2 (two) times daily.    . mycophenolate (MYFORTIC) 180 MG EC tablet Take 720 mg by mouth 2 (two) times daily.     Marland Kitchen omeprazole (PRILOSEC) 20 MG capsule Take 20 mg by mouth daily.  5  . predniSONE (DELTASONE) 5 MG tablet Take 5 mg by mouth at bedtime.    . rosuvastatin (CRESTOR) 20 MG tablet TAKE 1 TABLET BY MOUTH DAILY. (Patient taking differently: Taking 20mg  by mouth 4 times a week) 90 tablet 2  . sulfamethoxazole-trimethoprim (BACTRIM,SEPTRA) 400-80 MG tablet Take 1 tablet by mouth every Monday, Wednesday, and Friday.  6  . tacrolimus (PROGRAF) 1 MG capsule Taking 1 tablet by mouth in the am and 1/2 tablet in the pm    . tadalafil (CIALIS) 5 MG tablet Take 5 mg by mouth at bedtime.   11  . VASCEPA 1 g capsule TAKE 2 CAPSULES BY MOUTH TWICE DAILY. 120 capsule 9  . amiodarone (PACERONE) 200 MG tablet Take 1 tablet (200 mg total) by mouth 2 (two) times  daily. 90 tablet 3   No current facility-administered medications for this encounter.    Allergies  Allergen Reactions  . Iodinated Diagnostic Agents Other (See Comments) and Shortness Of Breath    Pt states that this medication causes him to code.  Respiratory arrest  . Penicillins Itching, Rash and Other (See Comments)    Has patient had a PCN reaction causing immediate rash, facial/tongue/throat swelling, SOB or lightheadedness with hypotension: No Has patient had a PCN reaction causing severe rash involving mucus membranes or skin necrosis: No Has patient had a PCN reaction that required hospitalization No Has patient had a PCN reaction occurring within the last 10 years: No If all of the above answers are "NO", then may proceed with Cephalosporin use.  Marland Kitchen Lisinopril     Increased Cr    Social History   Socioeconomic History  . Marital status: Married    Spouse name: Not on file  . Number of children: Not on file  . Years of education: Not on file  . Highest education level: Not on file  Occupational History  . Not on file  Tobacco Use  . Smoking status: Former Smoker    Packs/day: 0.30    Years: 30.00    Pack years: 9.00    Types: Cigarettes    Quit date: 03/15/2012    Years since quitting: 8.5  . Smokeless tobacco: Never Used  Vaping Use  . Vaping Use: Never used  Substance and Sexual Activity  . Alcohol use: Yes    Comment: occ  . Drug use: No  . Sexual activity: Not on file  Other Topics Concern  . Not on file  Social History Narrative  . Not on file   Social Determinants of Health   Financial Resource Strain:   . Difficulty of Paying Living Expenses: Not on file  Food Insecurity:   . Worried About Charity fundraiser in the Last Year: Not on file  . Ran Out of Food in the Last Year: Not on file  Transportation Needs:   . Lack of Transportation (Medical): Not on file  . Lack of Transportation (Non-Medical): Not on file  Physical Activity:   . Days  of Exercise per Week: Not on file  . Minutes of Exercise per Session:  Not on file  Stress:   . Feeling of Stress : Not on file  Social Connections:   . Frequency of Communication with Friends and Family: Not on file  . Frequency of Social Gatherings with Friends and Family: Not on file  . Attends Religious Services: Not on file  . Active Member of Clubs or Organizations: Not on file  . Attends Archivist Meetings: Not on file  . Marital Status: Not on file  Intimate Partner Violence:   . Fear of Current or Ex-Partner: Not on file  . Emotionally Abused: Not on file  . Physically Abused: Not on file  . Sexually Abused: Not on file    Family History  Adopted: Yes  Problem Relation Age of Onset  . Hypertension Other     ROS- All systems are reviewed and negative except as per the HPI above  Physical Exam: Vitals:   09/11/20 1536  BP: 136/90  Pulse: 76  Weight: 97 kg  Height: 5\' 8"  (1.727 m)   Wt Readings from Last 3 Encounters:  09/11/20 97 kg  05/17/20 94.9 kg  05/05/20 95.3 kg    Labs: Lab Results  Component Value Date   NA 140 02/15/2020   K 4.6 02/15/2020   CL 105 02/15/2020   CO2 23 02/15/2020   GLUCOSE 98 02/15/2020   BUN 21 02/15/2020   CREATININE 1.56 (H) 02/15/2020   CALCIUM 9.7 02/15/2020   PHOS 6.9 (H) 04/16/2012   MG 1.7 06/14/2017   Lab Results  Component Value Date   INR 1.07 03/01/2017   No results found for: CHOL, HDL, LDLCALC, TRIG   GEN- The patient is well appearing, alert and oriented x 3 today.   Head- normocephalic, atraumatic Eyes-  Sclera clear, conjunctiva pink Ears- hearing intact Oropharynx- clear Neck- supple, no JVP Lymph- no cervical lymphadenopathy Lungs- Clear to ausculation bilaterally, normal work of breathing Heart- Irregular rate and rhythm, no murmurs, rubs or gallops, PMI not laterally displaced GI- soft, NT, ND, + BS Extremities- no clubbing, cyanosis, or edema MS- no significant deformity or  atrophy Skin- no rash or lesion Psych- euthymic mood, full affect Neuro- strength and sensation are intact  EKG-  Sinus arrhythmia with wandering pacemaker ,frequent  PAC's, PNC's and non conducted PAC's reviewed with [name]    Assessment and Plan: 1. afib S/p recent afib  ablation with Dr. Rodena Goldmann  05/25/20 Amiodarone was stopped this summer after ablation  and is probably at washout now  Very irregular rhythm but does  not appear to be afib but frequent premature contractions He was taken off BB for bradycardia and shortness of breath after surgery He is now back on 50 mg bid but still with irregular rhythm   After discussion with [name], he will go back on amiodarone 200 mg bid and I will cut back the metoprolol back to 12.5 mg bid   2. CHA2DS2VASc of at least 3 Continue eliquis 5 mg bid   3. HTN Stable today   4. CAD  No anginal symptoms  F/u here in one week Has virtual with [name] 10/28  [name], [facility] [address] [place], [place] [address] [phone]

## 2020-09-18 ENCOUNTER — Other Ambulatory Visit: Payer: Self-pay

## 2020-09-18 ENCOUNTER — Encounter (HOSPITAL_COMMUNITY): Payer: Self-pay | Admitting: Nurse Practitioner

## 2020-09-18 ENCOUNTER — Ambulatory Visit (HOSPITAL_COMMUNITY)
Admission: RE | Admit: 2020-09-18 | Discharge: 2020-09-18 | Disposition: A | Discharge status: Home / Self Care | Payer: 59 | Source: Ambulatory Visit | Attending: Nurse Practitioner | Admitting: Nurse Practitioner

## 2020-09-18 VITALS — BP 120/68 | HR 57

## 2020-09-18 DIAGNOSIS — I491 Atrial premature depolarization: Secondary | ICD-10-CM | POA: Diagnosis not present

## 2020-09-18 DIAGNOSIS — D6869 Other thrombophilia: Secondary | ICD-10-CM

## 2020-09-18 DIAGNOSIS — Z79899 Other long term (current) drug therapy: Secondary | ICD-10-CM | POA: Insufficient documentation

## 2020-09-18 DIAGNOSIS — I480 Paroxysmal atrial fibrillation: Secondary | ICD-10-CM

## 2020-09-18 NOTE — Progress Notes (Signed)
[name] is here today for repeat ekg back on amiodarone for very frequent PAC's. He is taking 200 mg bid and saw improvement in the regularity of his heart rhythm  in just  1-2 days. He feels improved.  He is in rhythm with a rare PAC today. I suggest taking his amiodarone another week at 200 mg bid then reducing  to one a day. SR at 57 bpm today.

## 2020-09-20 ENCOUNTER — Other Ambulatory Visit (HOSPITAL_COMMUNITY): Payer: Self-pay | Admitting: Nephrology

## 2020-09-20 MED FILL — AMIODARONE HCL 200 MG TAB: 200 | 90 days supply | Qty: 180 | Fill #1

## 2020-09-20 MED FILL — SULFAMETHOXAZOLE-TMP SS TAB: 400-80 | 28 days supply | Qty: 12 | Fill #0

## 2020-09-20 MED FILL — MYCOPHENOLATE SODIUM 180 MG: 180 | 30 days supply | Qty: 240 | Fill #2

## 2020-09-26 DIAGNOSIS — Z940 Kidney transplant status: Secondary | ICD-10-CM | POA: Diagnosis not present

## 2020-09-26 DIAGNOSIS — I2510 Atherosclerotic heart disease of native coronary artery without angina pectoris: Secondary | ICD-10-CM | POA: Diagnosis not present

## 2020-09-26 DIAGNOSIS — I4892 Unspecified atrial flutter: Secondary | ICD-10-CM | POA: Diagnosis not present

## 2020-09-26 DIAGNOSIS — E785 Hyperlipidemia, unspecified: Secondary | ICD-10-CM | POA: Diagnosis not present

## 2020-09-26 DIAGNOSIS — I4891 Unspecified atrial fibrillation: Secondary | ICD-10-CM | POA: Diagnosis not present

## 2020-09-26 DIAGNOSIS — Z23 Encounter for immunization: Secondary | ICD-10-CM | POA: Diagnosis not present

## 2020-09-26 DIAGNOSIS — N400 Enlarged prostate without lower urinary tract symptoms: Secondary | ICD-10-CM | POA: Diagnosis not present

## 2020-09-26 DIAGNOSIS — I129 Hypertensive chronic kidney disease with stage 1 through stage 4 chronic kidney disease, or unspecified chronic kidney disease: Secondary | ICD-10-CM | POA: Diagnosis not present

## 2020-09-27 MED FILL — ELIQUIS 5 MG TABLET: 5 | 30 days supply | Qty: 60 | Fill #3

## 2020-09-27 MED FILL — VASCEPA 1 GM CAPSULE: 1 | 30 days supply | Qty: 120 | Fill #3

## 2020-09-27 MED FILL — METOPROLOL TARTRATE 25 MG T: 25 | 30 days supply | Qty: 30 | Fill #1

## 2020-09-28 ENCOUNTER — Telehealth (INDEPENDENT_AMBULATORY_CARE_PROVIDER_SITE_OTHER): Payer: 59 | Admitting: Internal Medicine

## 2020-09-28 VITALS — BP 136/86 | HR 59 | Ht 68.0 in | Wt 213.0 lb

## 2020-09-28 DIAGNOSIS — I480 Paroxysmal atrial fibrillation: Secondary | ICD-10-CM

## 2020-09-28 MED ORDER — AMIODARONE HCL 200 MG PO TABS: 100.0000 mg | ORAL_TABLET | Freq: Every day | ORAL | 3 refills | Status: DC

## 2020-09-28 NOTE — Patient Instructions (Signed)
Medication Instructions:  Your physician has recommended you make the following change in your medication:   Decrease Amiodarone to 100mg  (1/2 tablet by mouth) daily.  Discontinue Metoprolol as discussed by AFIB provider and Dr Caryl Comes.  *If you need a refill on your cardiac medications before your next appointment, please call your pharmacy*   Lab Work: None ordered.  If you have labs (blood work) drawn today and your tests are completely normal, you will receive your results only by: Marland Kitchen MyChart Message (if you have MyChart) OR . A paper copy in the mail If you have any lab test that is abnormal or we need to change your treatment, we will call you to review the results.   Testing/Procedures: None ordered.    Follow-Up: At [facility], you and your health needs are our priority.  As part of our continuing mission to provide you with exceptional heart care, we have created designated Provider Care Teams.  These Care Teams include your primary Cardiologist (physician) and Advanced Practice Providers (APPs -  Physician Assistants and Nurse Practitioners) who all work together to provide you with the care you need, when you need it.  We recommend signing up for the patient portal called "MyChart".  Sign up information is provided on this After Visit Summary.  MyChart is used to connect with patients for Virtual Visits (Telemedicine).  Patients are able to view lab/test results, encounter notes, upcoming appointments, etc.  Non-urgent messages can be sent to your provider as well.   To learn more about what you can do with MyChart, go to [email or web address].    Your next appointment:   11/16/2020 at 8:15am with Tommye Standard PA-C in person

## 2020-09-28 NOTE — Progress Notes (Signed)
Electrophysiology TeleHealth Note   Due to national recommendations of social distancing due to COVID 19, an audio/video telehealth visit is felt to be most appropriate for this patient at this time.  See MyChart message from today for the patient's consent to telehealth for [employer].   Date:  09/28/2020   ID:  [name], DOB [birth year], MRN [id number]  Location: patient's home  Provider location: [address] [place]  Evaluation Performed: Follow-up visit  PCP:  Patient, No Pcp Per  Cardiologist:     Electrophysiologist:  SK   Chief Complaint:   Atrial fib  History of Present Illness:    [name] is a 59 y.o. male who presents via audio/video conferencing for a telehealth visit today.  Since last being seen in our clinic foir Afib in setting of presumed HCM and ischemic heart disease s/p CABG 2013 and prior ablation JA 2018 and interval ablation PH6/21 after which amio discontinued, the patient reports doing much better following the resuming of the amio for suppression of freq atrial ectopy, almost within 24 hrs Notes more sob on metop than off.  HR 50s at rest   Seen AF clinic 9-10/21 x 3   DATE TEST EF   9/15 Echo   55-65 % LVH severe LAE (53/2.4)  7/16 cMRI   ASH Mid--apical  cavitary obliteration  No contrast 2/2 renal disease  9/18 TEE 65% HCM         Date Cr K TSH LFTs Hgb  9/17 1.5 4.6      10/19 1.5 4.1   17  5/20 1.26 4.7   16.1  3/21 1.56 4.6 1.12       The patient denies symptoms of fevers, chills, cough, or new SOB worrisome for COVID 19.   Past Medical History:  Diagnosis Date  . Arteriovenous fistula, acquired ([place])   . Atrial fibrillation and flutter ([place])   . Bladder stones   . BMI 31.0-31.9,adult   . CAD (coronary artery disease)    a. s/p CABG 03/2012: LIMA to LAD, free RIMA to OM1, SVG to D1, sequential SVG to AM and LPLB2, EVH via right thigh and leg.  . Cellulitis 04/13/2012   RLE saphenous  vein harvest incision   . CKD (chronic kidney disease)   . Dyslipidemia   . Gout   . History of kidney transplant   . Hyperglycemia   . Hyperlipidemia   . Hyperparathyroidism   . Hypertension   . Hypertrophic cardiomyopathy ([place])   . Hypomagnesemia   . Immunosuppression ([place])   . Intermittent palpitations   . Microscopic hematuria   . Migraines   . PAF and Flutter    a. Afib post-op CABG; AFlutter 10/2012  . Peripheral vascular disease ([place])   . Post-transplant erythrocytosis   . Renal disease   . S/P living-donor kidney transplantation   . Sinus node dysfunction-post termination pause    a. >8sec in setting of aflutter 10/2012  . Sleep pattern disturbance   . Stroke [facility]) 1995; 2001   denies residual  . Superficial vein thrombosis    a. R greater saphenous 04/2012  . Vitamin D deficiency   . VT (ventricular tachycardia) ([facility])     Past Surgical History:  Procedure Laterality Date  . ATRIAL FIBRILLATION ABLATION N/A 08/21/2017   Procedure: Atrial Fibrillation Ablation;  Surgeon: [name], MD;  Location: [facility];  Service: Cardiovascular;  Laterality: N/A;  . ATRIAL FLUTTER  ABLATION N/A 11/09/2012   Procedure: ATRIAL FLUTTER ABLATION;  Surgeon: [name], MD;  Location: [facility] CATH LAB;  Service: Cardiovascular;  Laterality: N/A;  . AV FISTULA PLACEMENT  2011   left forearm  . AV FISTULA PLACEMENT    . CARDIAC CATHETERIZATION  03/16/12  . CARDIOVERSION N/A 06/10/2017   Procedure: CARDIOVERSION;  Surgeon: Pixie Casino, MD;  Location[facility] ENDOSCOPY;  Service: Cardiovascular;  Laterality: N/A;  . CARDIOVERSION N/A 10/13/2017   Procedure: CARDIOVERSION;  Surgeon: Dorothy Spark, MD;  Location: [facility] ENDOSCOPY;  Service: Cardiovascular;  Laterality: N/A;  . CARDIOVERSION N/A 10/08/2018   Procedure: CARDIOVERSION;  Surgeon: Larey Dresser, MD;  Location: [facility] ENDOSCOPY;  Service: Cardiovascular;  Laterality: N/A;  . CARDIOVERSION N/A 04/27/2019   Procedure:  CARDIOVERSION;  Surgeon: Dorothy Spark, MD;  Location: [facility] ENDOSCOPY;  Service: Cardiovascular;  Laterality: N/A;  . CARDIOVERSION N/A 02/24/2020   Procedure: CARDIOVERSION;  Surgeon: Donato Heinz, MD;  Location: [place];  Service: Cardiovascular;  Laterality: N/A;  . CORONARY ARTERY BYPASS GRAFT  03/19/2012   Procedure: CORONARY ARTERY BYPASS GRAFTING (CABG);  Surgeon: Rexene Alberts, MD;  Location: [place];  Service: Open Heart Surgery;  Laterality: N/A;  (B) MAMMARY  . CYSTOSCOPY WITH BIOPSY N/A 08/15/2016   Procedure: CYSTOSCOPY WITH IDENTIFICATION OF RIGHT TRANSPLANTATION OF URETRAL ORFICE WITH FLUORESCEIN;  Surgeon: Carolan Clines, MD;  Location: [facility] ORS;  Service: Urology;  Laterality: N/A;  . DENTAL SURGERY  2008   multiple  . [place]   left  . FRACTURE SURGERY    . HERNIA REPAIR  54/6503   umbilical  . HOLMIUM LASER APPLICATION N/A 5/46/5681   Procedure: HOLMIUM LASER APPLICATION WITH 3 CM RIGHT BLADDER STONE;  Surgeon: Carolan Clines, MD;  Location: [facility] OR[facility];  Service: Urology;  Laterality: N/A;  . INGUINAL HERNIA REPAIR  09/2009   bilaterally  . KIDNEY TRANSPLANT    . LEFT HEART CATH AND CORS/GRAFTS ANGIOGRAPHY N/A 07/27/2019   Procedure: LEFT HEART CATH AND CORS/GRAFTS ANGIOGRAPHY;  Surgeon: Sherren Mocha, MD;  Location: [place] CV LAB;  Service: Cardiovascular;  Laterality: N/A;  . LEFT HEART CATHETERIZATION WITH CORONARY ANGIOGRAM N/A 03/16/2012   Procedure: LEFT HEART CATHETERIZATION WITH CORONARY ANGIOGRAM;  Surgeon: Sherren Mocha, MD;  Location: [facility] CATH LAB;  Service: Cardiovascular;  Laterality: N/A;  . RENAL ARTERY STENT  2001  . TEE WITHOUT CARDIOVERSION N/A 08/21/2017   Procedure: TRANSESOPHAGEAL ECHOCARDIOGRAM (TEE);  Surgeon: Larey Dresser, MD;  Location: [facility] ENDOSCOPY;  Service: Cardiovascular;  Laterality: N/A;  . TRANSURETHRAL RESECTION OF BLADDER TUMOR N/A 08/15/2016   Procedure: TRANSURETHRAL BIOPSY OF RIGHT BLADDER  WALL;  Surgeon: Carolan Clines, MD;  Location: [facility];  Service: Urology;  Laterality: N/A;    Current Outpatient Medications  Medication Sig Dispense Refill  . allopurinol (ZYLOPRIM) 100 MG tablet Take 200 mg by mouth daily.     Marland Kitchen amiodarone (PACERONE) 200 MG tablet Take 1 tablet (200 mg total) by mouth 2 (two) times daily. 90 tablet 3  . amLODipine (NORVASC) 5 MG tablet Take 1 tablet (5 mg total) by mouth daily. 90 tablet 3  . cloNIDine (CATAPRES) 0.1 MG tablet Take 0.1 mg by mouth 2 (two) times daily.    Marland Kitchen diltiazem (CARDIZEM) 60 MG tablet Take 1 tablet as needed every 6 hours for rapid afib HR over 100. (Patient taking differently: Take 60 mg by mouth every 6 (six) hours as needed (rapid afib HR over 100). )  45 tablet 1  . ELIQUIS 5 MG TABS tablet TAKE 1 TABLET BY MOUTH TWICE A DAY (Patient taking differently: Take 5 mg by mouth 2 (two) times daily. ) 60 tablet 5  . fenofibrate micronized (LOFIBRA) 134 MG capsule Take 134 mg by mouth daily.     . fluticasone (FLONASE) 50 MCG/ACT nasal spray Place 1 spray into both nostrils as needed for allergies or rhinitis.    . magnesium oxide (MAG-OX) 400 (241.3 Mg) MG tablet Take 400 mg by mouth 2 (two) times daily.    . metoprolol tartrate (LOPRESSOR) 50 MG tablet Take 12.5 mg by mouth 2 (two) times daily.    . mycophenolate (MYFORTIC) 180 MG EC tablet Take 720 mg by mouth 2 (two) times daily.     Marland Kitchen omeprazole (PRILOSEC) 20 MG capsule Take 20 mg by mouth daily.  5  . predniSONE (DELTASONE) 5 MG tablet Take 5 mg by mouth at bedtime.    . rosuvastatin (CRESTOR) 20 MG tablet TAKE 1 TABLET BY MOUTH DAILY. (Patient taking differently: Taking 20mg  by mouth 4 times a week) 90 tablet 2  . sulfamethoxazole-trimethoprim (BACTRIM,SEPTRA) 400-80 MG tablet Take 1 tablet by mouth every Monday, Wednesday, and Friday.  6  . tacrolimus (PROGRAF) 1 MG capsule Taking 1 tablet by mouth in the am and 1/2 tablet in the pm    . tadalafil (CIALIS) 5 MG tablet Take 5 mg by  mouth at bedtime.   11  . VASCEPA 1 g capsule TAKE 2 CAPSULES BY MOUTH TWICE DAILY. 120 capsule 9   No current facility-administered medications for this visit.    Allergies:   Iodinated diagnostic agents, Penicillins, and Lisinopril   Social History:  The patient  reports that he quit smoking about 8 years ago. His smoking use included cigarettes. He has a 9.00 pack-year smoking history. He has never used smokeless tobacco. He reports current alcohol use. He reports that he does not use drugs.   Family History:  The patient's   family history includes Hypertension in an other family member. He was adopted.   ROS:  Please see the history of present illness.   All other systems are personally reviewed and negative.    Exam:    Vital Signs:  BP 136/86   Pulse (!) 59   Ht 5\' 8"  (1.727 m)   Wt 213 lb (96.6 kg)   BMI 32.39 kg/m    Well appearing, alert and conversant, regular work of breathing,  good skin color Eyes- anicteric, neuro- grossly intact, skin- no apparent rash or lesions or cyanosis, mouth- oral mucosa is pink   Labs/Other Tests and Data Reviewed:    Recent Labs: 02/15/2020: BUN 21; Creatinine, Ser 1.56; Hemoglobin 17.4; Platelets 156; Potassium 4.6; Sodium 140   Wt Readings from Last 3 Encounters:  09/28/20 213 lb (96.6 kg)  09/11/20 213 lb 12.8 oz (97 kg)  05/17/20 209 lb 3.2 oz (94.9 kg)     Other studies personally reviewed: Additional studies/ records that were reviewed today include:AF clinic notes, ECGs Review of the above records today demonstrates:  (As above)  Prior radiographs:      ASSESSMENT & PLAN:   Atrial Flutter S/p RFCA-- recurrent and atypical s/p ablation 2018-JA  S/p RFCA [facility] 6/21  Freq PACs  LAE  LVH-Asymmetric 18/11 HCM   CAD s/p CABG 2013  High Risk Medication Surveillance amiodarone  Sleep disordered breathing   Sinus bradycardia  Status post kidney transplant   Less atrial fib  but freq symptomatic ectopy rapidly  suppressed by the resuming of amio,  It may be exquisitely sensitive and so will reduce the dose to 100 mg daily  Have encouraged him also to come off BB, as worse with metop-- not sure if aggravating his sinus brady or pulm or systemic fatigue or not, but with amio prob doesn't need it   Continue anticoagulation  Without symptoms of ischemia   Tolerating amio  COVID 19 screen The patient denies symptoms of COVID 19 at this time.  The importance of social distancing was discussed today.  Follow-up: 6 weeks  RU    Current medicines are reviewed at length with the patient today.   The patient does not have concerns regarding his medicines.  The following changes were made today:    Decrease amio.>> 100 daily after he returns from vacation Discontinue metop as suggested by afib clinic, to begin following return from vacation   Labs/ tests ordered today include:  No orders of the defined types were placed in this encounter.    Patient Risk:  after full review of this patients clinical status, I feel that they are at moderate risk at this time.  Today, I have spent 12  minutes with the patient with telehealth technology discussing the above.  Signed, Virl Axe, MD  09/28/2020 3:46 PM     [facility] [address] [place] [address] [phone] (office) [phone] (fax)

## 2020-10-02 ENCOUNTER — Other Ambulatory Visit (HOSPITAL_COMMUNITY): Payer: Self-pay | Admitting: Nephrology

## 2020-10-02 MED FILL — FENOFIBRATE 134 MG CAPSULE: 134 | 30 days supply | Qty: 30 | Fill #4

## 2020-10-02 MED FILL — TACROLIMUS ANHYDROUS 0.5MG: 0.5 | 30 days supply | Qty: 30 | Fill #4

## 2020-10-02 MED FILL — predniSONE 5 MG TABS: 5 | 30 days supply | Qty: 30 | Fill #0

## 2020-10-02 MED FILL — OMEPRAZOLE DR 20 MG CAPSULE: 20 | 30 days supply | Qty: 30 | Fill #1

## 2020-10-02 MED FILL — ALLOPURINOL 100 MG TABS: 100 | 30 days supply | Qty: 60 | Fill #0

## 2020-10-02 MED FILL — TACROLIMUS 1 MG CAPSULE: 1 | 30 days supply | Qty: 30 | Fill #1

## 2020-10-16 MED FILL — SULFAMETHOXAZOLE-TMP SS TAB: 400-80 | 28 days supply | Qty: 12 | Fill #1

## 2020-10-16 MED FILL — CloNIDine HCL 0.1 MG TAB: 0.1 | 90 days supply | Qty: 90 | Fill #1

## 2020-10-20 MED FILL — TADALAFIL 5 MG TABS: 5 | 90 days supply | Qty: 90 | Fill #3

## 2020-10-20 MED FILL — MYCOPHENOLATE SODIUM 180 MG: 180 | 30 days supply | Qty: 240 | Fill #3

## 2020-10-27 MED FILL — TACROLIMUS 1 MG CAPSULE: 1 | 30 days supply | Qty: 30 | Fill #2

## 2020-10-27 MED FILL — FENOFIBRATE 134 MG CAPSULE: 134 | 30 days supply | Qty: 30 | Fill #5

## 2020-10-27 MED FILL — METOPROLOL TARTRATE 25 MG T: 25 | 30 days supply | Qty: 30 | Fill #2

## 2020-10-27 MED FILL — ALLOPURINOL 100 MG TABS: 100 | 30 days supply | Qty: 60 | Fill #1

## 2020-10-27 MED FILL — predniSONE 5 MG TABS: 5 | 30 days supply | Qty: 30 | Fill #1

## 2020-10-27 MED FILL — TACROLIMUS ANHYDROUS 0.5MG: 0.5 | 30 days supply | Qty: 30 | Fill #5

## 2020-11-03 MED FILL — ELIQUIS 5 MG TABLET: 5 | 60 days supply | Qty: 120 | Fill #4

## 2020-11-03 MED FILL — VASCEPA 1 GM CAPSULE: 1 | 30 days supply | Qty: 120 | Fill #4

## 2020-11-09 MED FILL — ROSUVASTATIN CALCIUM 20 MG: 20 | 90 days supply | Qty: 90 | Fill #1

## 2020-11-09 MED FILL — OMEPRAZOLE DR 20 MG CAPSULE: 20 | 30 days supply | Qty: 30 | Fill #2

## 2020-11-09 MED FILL — SULFAMETHOXAZOLE-TMP SS TAB: 400-80 | 28 days supply | Qty: 12 | Fill #2

## 2020-11-15 NOTE — Progress Notes (Signed)
Cardiology Office Note Date:  11/16/2020  Patient ID:  [name], DOB [birth year], MRN [id number] PCP:  Patient, No Pcp Per  Cardiologist:  Dr. Johnsie Cancel Electrophysiologist: Dr. Caryl Comes    Chief Complaint: planned f/u  History of Present Illness: [name] is a 60 y.o. male with history of ESRF now s/p kidney transplant (2014) chronically on immunosupression as well as steroids with rejection issues intermittently, CAD w/CABG (2013) with p/o AFib, PAFlutter (flutter ablated in 2013), HTN, HLD, OSA not using CPAP, reported improved after significant weight loss,  and CVA, HCM, hx pf erythrocytosis w/phlebotomy historically  He comes in today to be seen for Der Caryl Comes, last seen by him Oct 2021 via tele health visit.  He recently went back on amiodarone 2/2 frequent atrial ectopy and doing better though felt the metoprolol made him more SOB, HRs reported 50's His amiodarone was reduced to 100mg  daily, discussed weaning off BB.  Planned for 6 week visit.  TODAY Attempts at down titrating his mediicines was unsuccessful unfortunately. He got down to amiodarone 100mg  daily and 12.5mg  of lopressor, had about 4 days of feeling really good, HR 70's and no AF/palpitations, though again with daily episodes of palpitations with HR 120's making him feel poorly. He is back on amiodarone 200mg  daily and 25mg  BID of metoprolol.  Has been about 4 days without AFib/palpitations, but his HR stuck again in the 50's even with exertion making him fatigue easily. No CP, no near syncope or syncope. No bleeding or songs of bleeding No rest SOB or nocturnal symptoms.  AF history Flutter ablation 2013 Dr. Caryl Comes PVI/CTI ablation Dr. Rayann Heman 2018 (noted below) PVI ablation June 2021, Dr. Rodena Goldmann (noted below)  AAD Amiodarone looks to have been on/off since 2013  Past Medical History:  Diagnosis Date  . Arteriovenous fistula, acquired ([place])   . Atrial fibrillation and flutter ([place])   . Bladder stones    . BMI 31.0-31.9,adult   . CAD (coronary artery disease)    a. s/p CABG 03/2012: LIMA to LAD, free RIMA to OM1, SVG to D1, sequential SVG to AM and LPLB2, EVH via right thigh and leg.  . Cellulitis 04/13/2012   RLE saphenous vein harvest incision   . CKD (chronic kidney disease)   . Dyslipidemia   . Gout   . History of kidney transplant   . Hyperglycemia   . Hyperlipidemia   . Hyperparathyroidism   . Hypertension   . Hypertrophic cardiomyopathy ([place])   . Hypomagnesemia   . Immunosuppression ([place])   . Intermittent palpitations   . Microscopic hematuria   . Migraines   . PAF and Flutter    a. Afib post-op CABG; AFlutter 10/2012  . Peripheral vascular disease ([place])   . Post-transplant erythrocytosis   . Renal disease   . S/P living-donor kidney transplantation   . Sinus node dysfunction-post termination pause    a. >8sec in setting of aflutter 10/2012  . Sleep pattern disturbance   . Stroke [facility]) 1995; 2001   denies residual  . Superficial vein thrombosis    a. R greater saphenous 04/2012  . Vitamin D deficiency   . VT (ventricular tachycardia) ([place])     Past Surgical History:  Procedure Laterality Date  . ATRIAL FIBRILLATION ABLATION N/A 08/21/2017   Procedure: Atrial Fibrillation Ablation;  Surgeon: [name], MD;  Location: [place] CV LAB;  Service: Cardiovascular;  Laterality: N/A;  . ATRIAL FLUTTER ABLATION N/A 11/09/2012   Procedure: ATRIAL FLUTTER ABLATION;  Surgeon: [name], MD;  Location: [facility] CATH LAB;  Service: Cardiovascular;  Laterality: N/A;  . AV FISTULA PLACEMENT  2011   left forearm  . AV FISTULA PLACEMENT    . CARDIAC CATHETERIZATION  03/16/12  . CARDIOVERSION N/A 06/10/2017   Procedure: CARDIOVERSION;  Surgeon: Pixie Casino, MD;  Location: [facility] ENDOSCOPY;  Service: Cardiovascular;  Laterality: N/A;  . CARDIOVERSION N/A 10/13/2017   Procedure: CARDIOVERSION;  Surgeon: Dorothy Spark, MD;  Location: [facility] ENDOSCOPY;  Service: Cardiovascular;   Laterality: N/A;  . CARDIOVERSION N/A 10/08/2018   Procedure: CARDIOVERSION;  Surgeon: Larey Dresser, MD;  Location: [facility] ENDOSCOPY;  Service: Cardiovascular;  Laterality: N/A;  . CARDIOVERSION N/A 04/27/2019   Procedure: CARDIOVERSION;  Surgeon: Dorothy Spark, MD;  Location: [facility] ENDOSCOPY;  Service: Cardiovascular;  Laterality: N/A;  . CARDIOVERSION N/A 02/24/2020   Procedure: CARDIOVERSION;  Surgeon: Donato Heinz, MD;  Location: [place];  Service: Cardiovascular;  Laterality: N/A;  . CORONARY ARTERY BYPASS GRAFT  03/19/2012   Procedure: CORONARY ARTERY BYPASS GRAFTING (CABG);  Surgeon: Rexene Alberts, MD;  Location: [place];  Service: Open Heart Surgery;  Laterality: N/A;  (B) MAMMARY  . CYSTOSCOPY WITH BIOPSY N/A 08/15/2016   Procedure: CYSTOSCOPY WITH IDENTIFICATION OF RIGHT TRANSPLANTATION OF URETRAL ORFICE WITH FLUORESCEIN;  Surgeon: Carolan Clines, MD;  Location: [facility] ORS;  Service: Urology;  Laterality: N/A;  . DENTAL SURGERY  2008   multiple  . [place]   left  . FRACTURE SURGERY    . HERNIA REPAIR  01/5851   umbilical  . HOLMIUM LASER APPLICATION N/A 7/78/2423   Procedure: HOLMIUM LASER APPLICATION WITH 3 CM RIGHT BLADDER STONE;  Surgeon: Carolan Clines, MD;  Location: [facility] ORS;  Service: Urology;  Laterality: N/A;  . INGUINAL HERNIA REPAIR  09/2009   bilaterally  . KIDNEY TRANSPLANT    . LEFT HEART CATH AND CORS/GRAFTS ANGIOGRAPHY N/A 07/27/2019   Procedure: LEFT HEART CATH AND CORS/GRAFTS ANGIOGRAPHY;  Surgeon: Sherren Mocha, MD;  Location: [place] CV LAB;  Service: Cardiovascular;  Laterality: N/A;  . LEFT HEART CATHETERIZATION WITH CORONARY ANGIOGRAM N/A 03/16/2012   Procedure: LEFT HEART CATHETERIZATION WITH CORONARY ANGIOGRAM;  Surgeon: Sherren Mocha, MD;  Location: [facility] CATH LAB;  Service: Cardiovascular;  Laterality: N/A;  . RENAL ARTERY STENT  2001  . TEE WITHOUT CARDIOVERSION N/A 08/21/2017   Procedure: TRANSESOPHAGEAL  ECHOCARDIOGRAM (TEE);  Surgeon: Larey Dresser, MD;  Location: [facility] ENDOSCOPY;  Service: Cardiovascular;  Laterality: N/A;  . TRANSURETHRAL RESECTION OF BLADDER TUMOR N/A 08/15/2016   Procedure: TRANSURETHRAL BIOPSY OF RIGHT BLADDER WALL;  Surgeon: Carolan Clines, MD;  Location: [facility] ORS;  Service: Urology;  Laterality: N/A;    Current Outpatient Medications  Medication Sig Dispense Refill  . allopurinol (ZYLOPRIM) 100 MG tablet Take 200 mg by mouth daily.    Marland Kitchen amiodarone (PACERONE) 200 MG tablet Take 0.5 tablets (100 mg total) by mouth daily. (Patient taking differently: Take 200 mg by mouth daily.) 45 tablet 3  . cloNIDine (CATAPRES) 0.1 MG tablet Take 0.1 mg by mouth daily.    Marland Kitchen diltiazem (CARDIZEM) 60 MG tablet Take 1 tablet as needed every 6 hours for rapid afib HR over 100. (Patient taking differently: Take 60 mg by mouth every 6 (six) hours as needed (rapid afib HR over 100).) 45 tablet 1  . ELIQUIS 5 MG TABS tablet TAKE 1 TABLET BY MOUTH TWICE A DAY 60 tablet 5  . fenofibrate micronized (LOFIBRA) 134  MG capsule Take 134 mg by mouth daily.     . fluticasone (FLONASE) 50 MCG/ACT nasal spray Place 1 spray into both nostrils as needed for allergies or rhinitis.    . magnesium oxide (MAG-OX) 400 (241.3 Mg) MG tablet Take 400 mg by mouth 2 (two) times daily.    . metoprolol tartrate (LOPRESSOR) 50 MG tablet Take 25 mg by mouth 2 (two) times daily.    . mycophenolate (MYFORTIC) 180 MG EC tablet Take 720 mg by mouth 2 (two) times daily.     Marland Kitchen omeprazole (PRILOSEC) 20 MG capsule Take 20 mg by mouth daily.  5  . predniSONE (DELTASONE) 5 MG tablet Take 5 mg by mouth at bedtime.    . rosuvastatin (CRESTOR) 20 MG tablet TAKE 1 TABLET BY MOUTH DAILY. (Patient taking differently: Taking 20mg  by mouth 4 times a week) 90 tablet 2  . sulfamethoxazole-trimethoprim (BACTRIM,SEPTRA) 400-80 MG tablet Take 1 tablet by mouth every Monday, Wednesday, and Friday.  6  . tacrolimus (PROGRAF) 1 MG capsule Taking 1  tablet by mouth in the am and 1/2 tablet in the pm    . tadalafil (CIALIS) 5 MG tablet Take 5 mg by mouth at bedtime.   11  . VASCEPA 1 g capsule TAKE 2 CAPSULES BY MOUTH TWICE DAILY. 120 capsule 9  . amLODipine (NORVASC) 5 MG tablet Take 1 tablet (5 mg total) by mouth daily. 90 tablet 3   No current facility-administered medications for this visit.    Allergies:   Iodinated diagnostic agents, Penicillins, and Lisinopril   Social History:  The patient  reports that he quit smoking about 8 years ago. His smoking use included cigarettes. He has a 9.00 pack-year smoking history. He has never used smokeless tobacco. He reports current alcohol use. He reports that he does not use drugs.   Family History:  The patient's family history includes Hypertension in an other family member. He was adopted.  ROS:  Please see the history of present illness.  All other systems are reviewed and otherwise negative.   PHYSICAL EXAM:  VS:  BP 120/70   Pulse (!) 50   Ht 5\' 8"  (1.727 m)   Wt 213 lb (96.6 kg)   SpO2 97%   BMI 32.39 kg/m  BMI: Body mass index is 32.39 kg/m. Well nourished, well developed, in no acute distress  HEENT: normocephalic, atraumatic  Neck: no JVD, carotid bruits or masses Cardiac:  RRR; no ectopy, bradycardic, soft SM, no rubs, or gallops Lungs: CTA b/l, no wheezing, rhonchi or rales  Abd: soft, nontender MS: no deformity or atrophy Ext:  trace edema, b/l, chronic looking skin changes  Skin: warm and dry, no rash Neuro:  No gross deficits appreciated Psych: euthymic mood, full affect    EKG:  Not done today  05/25/2020: EPS/Ablation (Dr. Rhae Hammock, Dr. Lenard Galloway) Findings:  -Baseline isolation of left inferior pulmonary veins and CTI with  reconnection of other pulmonary veins -Atrial fibrillation transiently induced; no atypical atrial flutter  induced -Successful re-isolation all pulmonary veins -Successful isolation of the posterior left atrium with linear ablation   and CFAE ablation    07/27/2019: LHC 1. Severe native vessel CAD with total occlusion of the proximal LAD, total occlusion of the non-dominant RCA, and severe stenosis of the first OM branch of the circumflex. 2. Mildly elevated LVEDP 3. S/P CABG with continued patency of the LIMA-LAD, free RIMA-OM, SVG-diagonal, and SVG-acute marginal. Occlusion of the sequence graft to the left posterolateral branch  Recommend: medical therapy - no targets for PCI. Resume apixaban tomorrow   08/21/2017: EPS/Ablation CONCLUSIONS: 1. Sinus rhythm upon presentation.   2. Intracardiac echo reveals a moderately enlarged left atrium with four separate pulmonary veins without evidence of pulmonary vein stenosis. 3. Successful electrical isolation and anatomical encircling of all four pulmonary veins with radiofrequency current. 4. Atypical atrial flutter not inducible today and therefore not amenable to mapping or ablation 5. Complete bidirectionaly isthmus block from prior CTI ablation confirmed 6. Very frequent and multifocal PACs suggest non PV source for afib.  Despite extensive mapping, there were no dominant PACs that could be ablated today.  I am doubtful that additional ablation would be beneficial in the future.  7. No early apparent complications.    03/02/17: TTE Study Conclusions - Left ventricle: Inferobasal hypokinesis The cavity size was   normal. Wall thickness was increased in a pattern of mild LVH.   Systolic function was normal. The estimated ejection fraction was   in the range of 55% to 60%. - Aortic valve: There was mild regurgitation. - Mitral valve: There was mild regurgitation. - Atrial septum: No defect or patent foramen ovale was identified. - Impressions: Abnormal GLS -11. Impressions: - Abnormal GLS -11.   Recent Labs: 02/15/2020: BUN 21; Creatinine, Ser 1.56; Hemoglobin 17.4; Platelets 156; Potassium 4.6; Sodium 140  No results found for requested labs within last 8760  hours.   CrCl cannot be calculated (Patient's most recent lab result is older than the maximum 21 days allowed.).   Wt Readings from Last 3 Encounters:  11/16/20 213 lb (96.6 kg)  09/28/20 213 lb (96.6 kg)  09/11/20 213 lb 12.8 oz (97 kg)     Other studies reviewed: Additional studies/records reviewed today include: summarized above  ASSESSMENT AND PLAN:  1. Persistent Afib, atypical AFlutter     S/p recurrent ablations     CHA2DS2Vasc is 5, on eliquis, appropriately dosed     Frequent atrial ectopy (noted at time of Dr. Jackalyn Lombard ablation)     chronic amiodarone      The palpitations/AFib are more symptomatic then the bradycardia for him, though not ideal in the 50's for him. I mentioned weaning off the clonidine completely though he mentioned even at the lower dose of metoprolol his BP started to climb and worried him with his one transplanted kidney. Edema with the amlodipine is bothersome to him and at previously higher doses was much more and intolerable. The idea of pace/ablate, or pacing for tachy/brady is less then ideal given immunocompromised state I will send my note to Dr. Caryl Comes, reach out for his thoughts/recommendations  2. CAD     s/p cath 2020 noted     No anginal symptoms     On BB, statin/fenofibrate, no ASA w/Wliquis     Recommend he re-establish with Dr. Johnsie Cancel   3. HTN     No changes today  4. Hx of HCM     Mild LVH on his last echo     Update his echo    Disposition: No changes, update labs today, have him see Dr. Caryl Comes in 6-8 weeks, sooner if needed    Current medicines are reviewed at length with the patient today.  The patient did not have any concerns regarding medicines.  Haywood Lasso, PA-C 11/16/2020 9:19 AM     [place] [place] [address] [phone] (office)  [phone] (fax)

## 2020-11-16 ENCOUNTER — Encounter: Payer: Self-pay | Admitting: Physician Assistant

## 2020-11-16 ENCOUNTER — Other Ambulatory Visit: Payer: Self-pay

## 2020-11-16 ENCOUNTER — Ambulatory Visit (INDEPENDENT_AMBULATORY_CARE_PROVIDER_SITE_OTHER): Payer: 59 | Admitting: Physician Assistant

## 2020-11-16 ENCOUNTER — Other Ambulatory Visit: Payer: Self-pay | Admitting: Physician Assistant

## 2020-11-16 VITALS — BP 120/70 | HR 50 | Ht 68.0 in | Wt 213.0 lb

## 2020-11-16 DIAGNOSIS — B353 Tinea pedis: Secondary | ICD-10-CM | POA: Diagnosis not present

## 2020-11-16 DIAGNOSIS — I4819 Other persistent atrial fibrillation: Secondary | ICD-10-CM

## 2020-11-16 DIAGNOSIS — B351 Tinea unguium: Secondary | ICD-10-CM | POA: Diagnosis not present

## 2020-11-16 DIAGNOSIS — I422 Other hypertrophic cardiomyopathy: Secondary | ICD-10-CM

## 2020-11-16 DIAGNOSIS — Z79899 Other long term (current) drug therapy: Secondary | ICD-10-CM

## 2020-11-16 DIAGNOSIS — I2510 Atherosclerotic heart disease of native coronary artery without angina pectoris: Secondary | ICD-10-CM | POA: Diagnosis not present

## 2020-11-16 LAB — COMPREHENSIVE METABOLIC PANEL
ALT: 27 IU/L (ref 0–44)
AST: 22 IU/L (ref 0–40)
Albumin/Globulin Ratio: 2 (ref 1.2–2.2)
Albumin: 4.3 g/dL (ref 3.8–4.9)
Alkaline Phosphatase: 52 IU/L (ref 44–121)
BUN/Creatinine Ratio: 12 (ref 9–20)
BUN: 21 mg/dL (ref 6–24)
Bilirubin Total: 0.5 mg/dL (ref 0.0–1.2)
CO2: 23 mmol/L (ref 20–29)
Calcium: 9.8 mg/dL (ref 8.7–10.2)
Chloride: 105 mmol/L (ref 96–106)
Creatinine, Ser: 1.73 mg/dL — ABNORMAL HIGH (ref 0.76–1.27)
GFR calc Af Amer: 50 mL/min/{1.73_m2} — ABNORMAL LOW (ref >59)
GFR calc non Af Amer: 43 mL/min/{1.73_m2} — ABNORMAL LOW (ref >59)
Globulin, Total: 2.1 g/dL (ref 1.5–4.5)
Glucose: 125 mg/dL — ABNORMAL HIGH (ref 65–99)
Potassium: 4.8 mmol/L (ref 3.5–5.2)
Sodium: 140 mmol/L (ref 134–144)
Total Protein: 6.4 g/dL (ref 6.0–8.5)

## 2020-11-16 LAB — CBC
Hematocrit: 46.2 % (ref 37.5–51.0)
Hemoglobin: 15.8 g/dL (ref 13.0–17.7)
MCH: 29.1 pg (ref 26.6–33.0)
MCHC: 34.2 g/dL (ref 31.5–35.7)
MCV: 85 fL (ref 79–97)
Platelets: 148 10*3/uL — ABNORMAL LOW (ref 150–450)
RBC: 5.43 x10E6/uL (ref 4.14–5.80)
RDW: 13.5 % (ref 11.6–15.4)
WBC: 6.5 10*3/uL (ref 3.4–10.8)

## 2020-11-16 LAB — HEPATIC FUNCTION PANEL: Bilirubin, Direct: 0.16 mg/dL (ref 0.00–0.40)

## 2020-11-16 MED ORDER — AMLODIPINE BESYLATE 5 MG PO TABS: 5.0000 mg | ORAL_TABLET | Freq: Every day | ORAL | 3 refills | Status: DC

## 2020-11-16 MED FILL — AMLODIPINE BESYLATE 5 MG TA: 5 | 90 days supply | Qty: 90 | Fill #0

## 2020-11-16 NOTE — Patient Instructions (Signed)
Medication Instructions:   Your physician recommends that you continue on your current medications as directed. Please refer to the Current Medication list given to you today.   *If you need a refill on your cardiac medications before your next appointment, please call your pharmacy*   Lab Work: CMET LFT AND CBC TODAY    If you have labs (blood work) drawn today and your tests are completely normal, you will receive your results only by:  [place] (if you have MyChart) OR  A paper copy in the mail If you have any lab test that is abnormal or we need to change your treatment, we will call you to review the results.   Testing/Procedures: Your physician has requested that you have an echocardiogram. Echocardiography is a painless test that uses sound waves to create images of your heart. It provides your doctor with information about the size and shape of your heart and how well your hearts chambers and valves are working. This procedure takes approximately one hour. There are no restrictions for this procedure.    Follow-Up: At [facility], you and your health needs are our priority.  As part of our continuing mission to provide you with exceptional heart care, we have created designated Provider Care Teams.  These Care Teams include your primary Cardiologist (physician) and Advanced Practice Providers (APPs -  Physician Assistants and Nurse Practitioners) who all work together to provide you with the care you need, when you need it.  We recommend signing up for the patient portal called "MyChart".  Sign up information is provided on this After Visit Summary.  MyChart is used to connect with patients for Virtual Visits (Telemedicine).  Patients are able to view lab/test results, encounter notes, upcoming appointments, etc.  Non-urgent messages can be sent to your provider as well.   To learn more about what you can do with MyChart, go to [email or web address].    Your next  appointment:   Restablish with Dr Johnsie Cancel next available   6-8  week(s)  The format for your next appointment:   In Person  Provider:   You may see Dr. Caryl Comes  or one of the following Advanced Practice Providers on your designated Care Team:       Other Instructions

## 2020-11-17 ENCOUNTER — Other Ambulatory Visit: Payer: Self-pay | Admitting: *Deleted

## 2020-11-17 DIAGNOSIS — Z79899 Other long term (current) drug therapy: Secondary | ICD-10-CM

## 2020-11-17 MED FILL — MYCOPHENOLATE SODIUM 180 MG: 180 | 30 days supply | Qty: 240 | Fill #4

## 2020-11-27 ENCOUNTER — Other Ambulatory Visit (HOSPITAL_COMMUNITY): Payer: Self-pay | Admitting: Nephrology

## 2020-11-27 MED FILL — predniSONE 5 MG TABS: 5 | 30 days supply | Qty: 30 | Fill #2

## 2020-11-27 MED FILL — TACROLIMUS 1 MG CAPS: 1 | 30 days supply | Qty: 30 | Fill #3

## 2020-11-27 MED FILL — TACROLIMUS ANHYDROUS 0.5MG: 0.5 | 30 days supply | Qty: 30 | Fill #6

## 2020-11-27 MED FILL — ALLOPURINOL 100 MG TABS: 100 | 30 days supply | Qty: 60 | Fill #2

## 2020-11-27 MED FILL — FENOFIBRATE 134 MG CAPSULE: 134 | 30 days supply | Qty: 30 | Fill #0

## 2020-11-27 MED FILL — METOPROLOL TARTRATE 25 MG T: 25 | 30 days supply | Qty: 30 | Fill #3

## 2020-12-01 MED FILL — VASCEPA 1 GM CAPSULE: 1 | 30 days supply | Qty: 120 | Fill #5

## 2020-12-08 ENCOUNTER — Other Ambulatory Visit: Payer: Self-pay

## 2020-12-08 ENCOUNTER — Ambulatory Visit (HOSPITAL_COMMUNITY): Payer: 59 | Attending: Cardiovascular Disease

## 2020-12-08 DIAGNOSIS — I422 Other hypertrophic cardiomyopathy: Secondary | ICD-10-CM | POA: Diagnosis not present

## 2020-12-08 LAB — ECHOCARDIOGRAM COMPLETE
Area-P 1/2: 3.12 cm2
P 1/2 time: 567 msec
S' Lateral: 3.8 cm

## 2020-12-08 MED ORDER — PERFLUTREN LIPID MICROSPHERE
1.0000 mL | INTRAVENOUS | Status: AC | PRN
  Administered 2020-12-08: 1 mL via INTRAVENOUS

## 2020-12-08 MED FILL — OMEPRAZOLE DR 20 MG CAPSULE: 20 | 30 days supply | Qty: 30 | Fill #3

## 2020-12-08 MED FILL — SULFAMETHOXAZOLE-TMP SS TAB: 400-80 | 28 days supply | Qty: 12 | Fill #3

## 2020-12-09 ENCOUNTER — Emergency Department (HOSPITAL_BASED_OUTPATIENT_CLINIC_OR_DEPARTMENT_OTHER): Payer: 59

## 2020-12-09 ENCOUNTER — Encounter (HOSPITAL_BASED_OUTPATIENT_CLINIC_OR_DEPARTMENT_OTHER): Payer: Self-pay | Admitting: Emergency Medicine

## 2020-12-09 ENCOUNTER — Other Ambulatory Visit: Payer: Self-pay

## 2020-12-09 ENCOUNTER — Emergency Department (HOSPITAL_BASED_OUTPATIENT_CLINIC_OR_DEPARTMENT_OTHER)
Admission: EM | Admit: 2020-12-09 | Discharge: 2020-12-09 | Disposition: A | Discharge status: Home / Self Care | Payer: 59 | Attending: Emergency Medicine | Admitting: Emergency Medicine

## 2020-12-09 DIAGNOSIS — Z951 Presence of aortocoronary bypass graft: Secondary | ICD-10-CM | POA: Diagnosis not present

## 2020-12-09 DIAGNOSIS — I25119 Atherosclerotic heart disease of native coronary artery with unspecified angina pectoris: Secondary | ICD-10-CM | POA: Insufficient documentation

## 2020-12-09 DIAGNOSIS — Z940 Kidney transplant status: Secondary | ICD-10-CM | POA: Diagnosis not present

## 2020-12-09 DIAGNOSIS — Z20822 Contact with and (suspected) exposure to covid-19: Secondary | ICD-10-CM | POA: Diagnosis not present

## 2020-12-09 DIAGNOSIS — I129 Hypertensive chronic kidney disease with stage 1 through stage 4 chronic kidney disease, or unspecified chronic kidney disease: Secondary | ICD-10-CM | POA: Insufficient documentation

## 2020-12-09 DIAGNOSIS — N183 Chronic kidney disease, stage 3 unspecified: Secondary | ICD-10-CM | POA: Insufficient documentation

## 2020-12-09 DIAGNOSIS — Z79899 Other long term (current) drug therapy: Secondary | ICD-10-CM | POA: Diagnosis not present

## 2020-12-09 DIAGNOSIS — Z87891 Personal history of nicotine dependence: Secondary | ICD-10-CM | POA: Insufficient documentation

## 2020-12-09 DIAGNOSIS — Z7901 Long term (current) use of anticoagulants: Secondary | ICD-10-CM | POA: Insufficient documentation

## 2020-12-09 DIAGNOSIS — R0602 Shortness of breath: Secondary | ICD-10-CM | POA: Diagnosis not present

## 2020-12-09 DIAGNOSIS — R059 Cough, unspecified: Secondary | ICD-10-CM | POA: Diagnosis not present

## 2020-12-09 DIAGNOSIS — I517 Cardiomegaly: Secondary | ICD-10-CM | POA: Diagnosis not present

## 2020-12-09 MED ORDER — BENZONATATE 100 MG PO CAPS: 100.0000 mg | ORAL_CAPSULE | Freq: Three times a day (TID) | ORAL | 0 refills | Status: DC

## 2020-12-09 MED ORDER — ALBUTEROL SULFATE HFA 108 (90 BASE) MCG/ACT IN AERS
2.0000 | INHALATION_SPRAY | Freq: Once | RESPIRATORY_TRACT | Status: AC
  Administered 2020-12-09: 2 via RESPIRATORY_TRACT
  Filled 2020-12-09: qty 6.7

## 2020-12-09 MED ORDER — AEROCHAMBER PLUS FLO-VU MEDIUM MISC
1.0000 | Freq: Once | Status: AC
  Administered 2020-12-09: 1
  Filled 2020-12-09: qty 1

## 2020-12-09 MED ORDER — DOXYCYCLINE HYCLATE 100 MG PO CAPS: 100.0000 mg | ORAL_CAPSULE | Freq: Two times a day (BID) | ORAL | 0 refills | Status: AC

## 2020-12-09 NOTE — ED Triage Notes (Signed)
Pt arrives pov with c/o cough x 7 days. Denies covid exposure, endorses being vaccinated for Covid

## 2020-12-09 NOTE — Discharge Instructions (Addendum)
I suspect that you may have underlying lung damage from your smoking history. I have given you a prescription for antibiotics. If you develop fevers, your symptoms worsen, or you have other concerns please seek additional medical care and evaluation.   You may use your inhaler as needed.  Typically we start with 2 puffs every 4 hours as needed but you may use 4 puffs if needed or use it more frequent if needed.  If you are using it because you are short of breath and using it consistently such as more than 2 puffs every 2 hours for 8 hours please return to the emergency room.   You may have diarrhea from the antibiotics.  It is very important that you continue to take the antibiotics even if you get diarrhea unless a medical professional tells you that you may stop taking them.  If you stop too early the bacteria you are being treated for will become stronger and you may need different, more powerful antibiotics that have more side effects and worsening diarrhea.  Please stay well hydrated and consider probiotics as they may decrease the severity of your diarrhea.

## 2020-12-09 NOTE — ED Provider Notes (Signed)
[facility] EMERGENCY DEPARTMENT Provider Note   CSN: [id number] Arrival date & time: 12/09/20  1059     History Chief Complaint  Patient presents with   Cough    [name] is a 60 y.o. male with a past medical history of stroke, S/P CABG, prior history of smoking, a-fib, anticoagulation with eliquis, hypertrophic cardiomyopathy, received kidney transplant with chronic steroid and immunosuppressive use, hypertension, CKD, peripheral vascular disease, who presents today for evaluation of cough.  He states that he is vaccinated against Covid close contacts are sick. He states that he over the past 7 days has had cough.  He states that it is a productive cough.  He states that he did previously smoke however is not smoking currently.  He denies any history of COPD, does not use an albuterol inhaler.  Denies fevers, myalgias/arthralgias, nausea vomiting diarrhea, significant fatigue, shortness of breath or chest pain.  He states that he can clear himself wheezing when he is takes a full deep breath.  He does report that a week before his symptoms he had a scratchy throat for 3 days however that resolved prior to cough.  He denies significant nasal congestion.  He is attempted Delsym and Mucinex without significant relief in his symptoms.  He had a 2D echo in the past week he reports.  He has not had any medication changes recently, does not take lisinopril.  HPI     Past Medical History:  Diagnosis Date   Arteriovenous fistula, acquired (HCC)    Atrial fibrillation and flutter (HCC)    Bladder stones    BMI 31.0-31.9,adult    CAD (coronary artery disease)    a. s/p CABG 03/2012: LIMA to LAD, free RIMA to OM1, SVG to D1, sequential SVG to AM and LPLB2, EVH via right thigh and leg.   Cellulitis 04/13/2012   RLE saphenous vein harvest incision    CKD (chronic kidney disease)    Dyslipidemia    Gout    History of kidney transplant    Hyperglycemia     Hyperlipidemia    Hyperparathyroidism    Hypertension    Hypertrophic cardiomyopathy (HCC)    Hypomagnesemia    Immunosuppression (HCC)    Intermittent palpitations    Microscopic hematuria    Migraines    PAF and Flutter    a. Afib post-op CABG; AFlutter 10/2012   Peripheral vascular disease (HCC)    Post-transplant erythrocytosis    Renal disease    S/P living-donor kidney transplantation    Sinus node dysfunction-post termination pause    a. >8sec in setting of aflutter 10/2012   Sleep pattern disturbance    Stroke [facility]) 1995; 2001   denies residual   Superficial vein thrombosis    a. R greater saphenous 04/2012   Vitamin D deficiency    VT (ventricular tachycardia) [facility])     Patient Active Problem List   Diagnosis Date Noted   Angina pectoris (HCC) 07/27/2019   Atrial fibrillation (HCC) 08/21/2017   Hypomagnesemia 06/11/2017   NSVT (nonsustained ventricular tachycardia) (HCC) 06/11/2017   Atrial fibrillation with RVR (HCC) 06/09/2017   Pure hypercholesterolemia    Demand ischemia (HCC)    CAD (coronary artery disease)    Atrial flutter (HCC) 03/01/2017   History of renal transplant 02/16/2017   Immunosuppressed status (HCC) 02/16/2017   Hypertensive heart disease without CHF 02/16/2017   Thrombocytopenia (HCC) 02/16/2017   Long term (current) use of anticoagulants 11/02/2012   Stroke (HCC)  S/P CABG (coronary artery bypass graft) 05/12/2012   Smoking 01/15/2012   CKD (chronic kidney disease), stage III ([place]) 01/15/2012    Past Surgical History:  Procedure Laterality Date   ATRIAL FIBRILLATION ABLATION N/A 08/21/2017   Procedure: Atrial Fibrillation Ablation;  Surgeon: [name], MD;  Location: [place] CV LAB;  Service: Cardiovascular;  Laterality: N/A;   ATRIAL FLUTTER ABLATION N/A 11/09/2012   Procedure: ATRIAL FLUTTER ABLATION;  Surgeon: [name], MD;  Location: [facility] CATH LAB;  Service: Cardiovascular;   Laterality: N/A;   AV FISTULA PLACEMENT  2011   left forearm   AV FISTULA PLACEMENT     CARDIAC CATHETERIZATION  03/16/12   CARDIOVERSION N/A 06/10/2017   Procedure: CARDIOVERSION;  Surgeon: Pixie Casino, MD;  Location: [facility] ENDOSCOPY;  Service: Cardiovascular;  Laterality: N/A;   CARDIOVERSION N/A 10/13/2017   Procedure: CARDIOVERSION;  Surgeon: Dorothy Spark, MD;  Location: [facility] ENDOSCOPY;  Service: Cardiovascular;  Laterality: N/A;   CARDIOVERSION N/A 10/08/2018   Procedure: CARDIOVERSION;  Surgeon: Larey Dresser, MD;  Location: [facility] ENDOSCOPY;  Service: Cardiovascular;  Laterality: N/A;   CARDIOVERSION N/A 04/27/2019   Procedure: CARDIOVERSION;  Surgeon: Dorothy Spark, MD;  Location: [facility] ENDOSCOPY;  Service: Cardiovascular;  Laterality: N/A;   CARDIOVERSION N/A 02/24/2020   Procedure: CARDIOVERSION;  Surgeon: Donato Heinz, MD;  Location: [place];  Service: Cardiovascular;  Laterality: N/A;   CORONARY ARTERY BYPASS GRAFT  03/19/2012   Procedure: CORONARY ARTERY BYPASS GRAFTING (CABG);  Surgeon: Rexene Alberts, MD;  Location: [place];  Service: Open Heart Surgery;  Laterality: N/A;  (B) MAMMARY   CYSTOSCOPY WITH BIOPSY N/A 08/15/2016   Procedure: CYSTOSCOPY WITH IDENTIFICATION OF RIGHT TRANSPLANTATION OF URETRAL ORFICE WITH FLUORESCEIN;  Surgeon: Carolan Clines, MD;  Location: [facility] ORS;  Service: Urology;  Laterality: N/A;   DENTAL SURGERY  2008   multiple   ELBOW FRACTURE SURGERY  1972   left   FRACTURE SURGERY     HERNIA REPAIR  41/7408   umbilical   HOLMIUM LASER APPLICATION N/A 1/44/8185   Procedure: HOLMIUM LASER APPLICATION WITH 3 CM RIGHT BLADDER STONE;  Surgeon: Carolan Clines, MD;  Location: [facility];  Service: Urology;  Laterality: N/A;   INGUINAL HERNIA REPAIR  09/2009   bilaterally   KIDNEY TRANSPLANT     LEFT HEART CATH AND CORS/GRAFTS ANGIOGRAPHY N/A 07/27/2019   Procedure: LEFT HEART CATH AND CORS/GRAFTS ANGIOGRAPHY;  Surgeon:  Sherren Mocha, MD;  Location: [place] CV LAB;  Service: Cardiovascular;  Laterality: N/A;   LEFT HEART CATHETERIZATION WITH CORONARY ANGIOGRAM N/A 03/16/2012   Procedure: LEFT HEART CATHETERIZATION WITH CORONARY ANGIOGRAM;  Surgeon: Sherren Mocha, MD;  Location: [facility] CATH LAB;  Service: Cardiovascular;  Laterality: N/A;   RENAL ARTERY STENT  2001   TEE WITHOUT CARDIOVERSION N/A 08/21/2017   Procedure: TRANSESOPHAGEAL ECHOCARDIOGRAM (TEE);  Surgeon: Larey Dresser, MD;  Location: [facility] ENDOSCOPY;  Service: Cardiovascular;  Laterality: N/A;   TRANSURETHRAL RESECTION OF BLADDER TUMOR N/A 08/15/2016   Procedure: TRANSURETHRAL BIOPSY OF RIGHT BLADDER WALL;  Surgeon: Carolan Clines, MD;  Location: [facility] OR[facility];  Service: Urology;  Laterality: N/A;       Family History  Adopted: Yes  Problem Relation Age of Onset   Hypertension Other     Social History   Tobacco Use   Smoking status: Former Smoker    Packs/day: 0.30    Years: 30.00    Pack years: 9.00    Types: Cigarettes  Quit date: 03/15/2012    Years since quitting: 8.7   Smokeless tobacco: Never Used  Vaping Use   Vaping Use: Never used  Substance Use Topics   Alcohol use: Yes    Comment: occ   Drug use: No    Home Medications Prior to Admission medications   Medication Sig Start Date End Date Taking? Authorizing Provider  benzonatate (TESSALON) 100 MG capsule Take 1 capsule (100 mg total) by mouth every 8 (eight) hours. 12/09/20  Yes Lorin Glass, PA-C  doxycycline (VIBRAMYCIN) 100 MG capsule Take 1 capsule (100 mg total) by mouth 2 (two) times daily for 7 days. 12/09/20 12/16/20 Yes Lorin Glass, PA-C  allopurinol (ZYLOPRIM) 100 MG tablet Take 200 mg by mouth daily.    [provider]  amiodarone (PACERONE) 200 MG tablet Take 0.5 tablets (100 mg total) by mouth daily. Patient taking differently: Take 200 mg by mouth daily. 09/28/20   [name], MD  amLODipine (NORVASC) 5 MG tablet Take 1  tablet (5 mg total) by mouth daily. 11/16/20   Baldwin Jamaica, PA-C  cloNIDine (CATAPRES) 0.1 MG tablet Take 0.1 mg by mouth daily.    [provider]  diltiazem (CARDIZEM) 60 MG tablet Take 1 tablet as needed every 6 hours for rapid afib HR over 100. Patient taking differently: Take 60 mg by mouth every 6 (six) hours as needed (rapid afib HR over 100). 06/30/19   [name], MD  ELIQUIS 5 MG TABS tablet TAKE 1 TABLET BY MOUTH TWICE A DAY 11/29/19   [name], MD  fenofibrate micronized (LOFIBRA) 134 MG capsule Take 134 mg by mouth daily.     [provider]  fluticasone (FLONASE) 50 MCG/ACT nasal spray Place 1 spray into both nostrils as needed for allergies or rhinitis.    [provider]  magnesium oxide (MAG-OX) 400 (241.3 Mg) MG tablet Take 400 mg by mouth 2 (two) times daily.    [provider]  metoprolol tartrate (LOPRESSOR) 50 MG tablet Take 25 mg by mouth 2 (two) times daily.    [provider]  mycophenolate (MYFORTIC) 180 MG EC tablet Take 720 mg by mouth 2 (two) times daily.     [provider]  omeprazole (PRILOSEC) 20 MG capsule Take 20 mg by mouth daily.    [provider]  predniSONE (DELTASONE) 5 MG tablet Take 5 mg by mouth at bedtime.    [provider]  rosuvastatin (CRESTOR) 20 MG tablet TAKE 1 TABLET BY MOUTH DAILY. Patient taking differently: Taking 20mg  by mouth 4 times a week 08/09/20   [name], MD  sulfamethoxazole-trimethoprim Octavio Graves) 400-80 MG tablet Take 1 tablet by mouth every Monday, Wednesday, and Friday.    [provider]  tacrolimus (PROGRAF) 1 MG capsule Taking 1 tablet by mouth in the am and 1/2 tablet in the pm 09/05/20   [provider]  tadalafil (CIALIS) 5 MG tablet Take 5 mg by mouth at bedtime.  02/13/18   [provider]  VASCEPA 1 g capsule TAKE 2 CAPSULES BY MOUTH TWICE DAILY. 06/28/20   [name], MD    Allergies     Iodinated diagnostic agents, Penicillins, and Lisinopril  Review of Systems   Review of Systems  Constitutional: Negative for chills and fever.  HENT: Negative for congestion.   Eyes: Negative for visual disturbance.  Respiratory: Positive for cough (Productive) and wheezing. Negative for chest tightness and shortness of breath.  Cardiovascular: Negative for chest pain.  Gastrointestinal: Negative for abdominal pain, diarrhea, nausea and vomiting.  Genitourinary: Negative for decreased urine volume, difficulty urinating and dysuria.  Musculoskeletal: Negative for back pain and neck pain.  Skin: Negative for color change.  Allergic/Immunologic: Positive for immunocompromised state.  Neurological: Negative for weakness and headaches.  Psychiatric/Behavioral: Negative for confusion.    Physical Exam Updated Vital Signs BP 133/84 (BP Location: Right Arm)    Pulse 60    Temp 97.9 F (36.6 C) (Oral)    Resp 16    Ht 5\' 8"  (1.727 m)    Wt 93 kg    SpO2 98%    BMI 31.17 kg/m   Physical Exam Vitals and nursing note reviewed.  Constitutional:      General: He is not in acute distress.    Appearance: He is not diaphoretic.  HENT:     Head: Normocephalic and atraumatic.  Eyes:     General: No scleral icterus.       Right eye: No discharge.        Left eye: No discharge.     Conjunctiva/sclera: Conjunctivae normal.  Cardiovascular:     Rate and Rhythm: Normal rate and regular rhythm.  Pulmonary:     Effort: Pulmonary effort is normal. No respiratory distress.     Breath sounds: No stridor. Wheezing (Diffuse bilaterally) present.     Comments: Frequent cough Abdominal:     General: There is no distension.     Tenderness: There is no abdominal tenderness.  Musculoskeletal:        General: No deformity.     Cervical back: Normal range of motion and neck supple.     Comments: 3+ pitting bilateral lower extremity edema.  This is symmetric.  Skin:    General: Skin is warm and dry.   Neurological:     Mental Status: He is alert.     Motor: No abnormal muscle tone.     Comments: Patient is awake and alert, answers all questions appropriately.  Speech is not slurred.  Facial movements are grossly symmetric.  Psychiatric:        Mood and Affect: Mood normal.        Behavior: Behavior normal.     ED Results / Procedures / Treatments   Labs (all labs ordered are listed, but only abnormal results are displayed) Labs Reviewed  SARS CORONAVIRUS 2 (TAT 6-24 HRS)    EKG None  Radiology- Echo performed yesterday.  DG Chest Portable 1 View  Result Date: 12/09/2020 CLINICAL DATA:  Cough and congestion for 7 days EXAM: PORTABLE CHEST 1 VIEW COMPARISON:  11/10/2019 FINDINGS: Cardiomegaly. Prior CABG. Stable aortic and hilar contours. Stable lung markings with accentuation in the right infrahilar lung. There is no edema, consolidation, effusion, or pneumothorax. IMPRESSION: Stable from prior.  No acute finding. Electronically Signed   By: Monte Fantasia M.D.   On: 12/09/2020 11:55   ECHOCARDIOGRAM COMPLETE  Result Date: 12/08/2020    ECHOCARDIOGRAM REPORT   Patient Name:   [name] Date of Exam: 12/08/2020 Medical Rec #:  [id number]      Height:       68.0 in Accession #:    [id number]     Weight:       213.0 lb Date of Birth:  1961/02/27       BSA:          2.099 m Patient Age:    73 years  BP:           120/70 mmHg Patient Gender: M              HR:           64 bpm. Exam Location:  [place] Procedure: 2D Echo Indications:    I42.2 Hypertrophic cardiomyopathy  History:        Patient has prior history of Echocardiogram examinations, most                 recent 08/31/2017. CAD, Prior CABG, Stroke, Arrythmias:Atrial                 Flutter, Atrial Fibrillation and NSVT.; Risk                 Factors:Hypertension, Dyslipidemia and Former Smoker. CKD stage                 3. H/o renal transplant (2014).  Sonographer:    Jessee Avers, RDCS Referring Phys: [id number] [place]  1. Hypertrophic cardiomyopathy with septal thickness unchanged from prior. Apical hypertrophy also noted. There is near cavity obliteration at the apex. Mild apical gradient at rest. Peak velocity 2.1 cm/s. Peak gradient 18 mmHg. Gradient increases with  Valsalva. Peak velocity 2.48 m/s. Peak gradient 25 mmHg. Left ventricular ejection fraction, by estimation, is 70 to 75%. The left ventricle has hyperdynamic function. The left ventricle has no regional wall motion abnormalities. The left ventricular internal cavity size was mildly dilated. There is severe asymmetric left ventricular hypertrophy of the septal segment. Left ventricular diastolic parameters are consistent with Grade II diastolic dysfunction (pseudonormalization). Elevated left ventricular end-diastolic pressure.  2. Right ventricular systolic function is normal. The right ventricular size is normal. There is normal pulmonary artery systolic pressure.  3. Left atrial size was severely dilated.  4. The mitral valve is normal in structure. Mild mitral valve regurgitation. No evidence of mitral stenosis.  5. The aortic valve is tricuspid. Aortic valve regurgitation is mild. No aortic stenosis is present.  6. Aortic dilatation noted. There is mild dilatation of the ascending aorta, measuring 41 mm.  7. The inferior vena cava is normal in size with greater than 50% respiratory variability, suggesting right atrial pressure of 3 mmHg. Comparison(s): TEE 08/31/17 EF 60%. TTE 03/02/17 EF 55-60%. FINDINGS  Left Ventricle: Hypertrophic cardiomyopathy with septal thickness unchanged from prior. Apical hypertrophy also noted. There is near cavity obliteration at the apex. Mild apical gradient at rest. Peak velocity 2.1 cm/s. Peak gradient 18 mmHg. Gradient increases with Valsalva. Peak velocity 2.48 m/s. Peak gradient 25 mmHg. Left ventricular ejection fraction, by estimation, is 70 to 75%. The left ventricle has hyperdynamic function. The left  ventricle has no regional wall motion abnormalities. Definity contrast agent was given IV to delineate the left ventricular endocardial borders. The left ventricular internal cavity size was mildly dilated. There is severe asymmetric left ventricular hypertrophy of the septal segment. Left ventricular diastolic parameters are consistent with Grade II diastolic dysfunction (pseudonormalization). Elevated left ventricular end-diastolic pressure. Right Ventricle: The right ventricular size is normal. No increase in right ventricular wall thickness. Right ventricular systolic function is normal. There is normal pulmonary artery systolic pressure. The tricuspid regurgitant velocity is 1.87 m/s, and  with an assumed right atrial pressure of 3 mmHg, the estimated right ventricular systolic pressure is 123XX123 mmHg. Left Atrium: Left atrial size was severely dilated. Right Atrium: Right atrial size was normal in size. Pericardium:  There is no evidence of pericardial effusion. Mitral Valve: The mitral valve is normal in structure. Mild mitral annular calcification. Mild mitral valve regurgitation. No evidence of mitral valve stenosis. Tricuspid Valve: The tricuspid valve is normal in structure. Tricuspid valve regurgitation is trivial. No evidence of tricuspid stenosis. Aortic Valve: The aortic valve is tricuspid. Aortic valve regurgitation is mild. Aortic regurgitation PHT measures 567 msec. No aortic stenosis is present. Pulmonic Valve: The pulmonic valve was normal in structure. Pulmonic valve regurgitation is not visualized. No evidence of pulmonic stenosis. Aorta: Aortic dilatation noted. There is mild dilatation of the ascending aorta, measuring 41 mm. Venous: The inferior vena cava is normal in size with greater than 50% respiratory variability, suggesting right atrial pressure of 3 mmHg. IAS/Shunts: No atrial level shunt detected by color flow Doppler.  LEFT VENTRICLE PLAX 2D LVIDd:         5.70 cm  Diastology LVIDs:          3.80 cm  LV e' lateral:   5.29 cm/s LV PW:         0.80 cm  LV E/e' lateral: 13.4 LV IVS:        1.80 cm LVOT diam:     2.00 cm LV SV:         87 LV SV Index:   41 LVOT Area:     3.14 cm  RIGHT VENTRICLE RV Basal diam:  3.40 cm RV S prime:     8.22 cm/s TAPSE (M-mode): 1.3 cm RVSP:           17.0 mmHg LEFT ATRIUM              Index       RIGHT ATRIUM           Index LA diam:        4.60 cm  2.19 cm/m  RA Pressure: 3.00 mmHg LA Vol (A2C):   78.5 ml  37.40 ml/m RA Area:     16.90 cm LA Vol (A4C):   119.0 ml 56.69 ml/m RA Volume:   40.70 ml  19.39 ml/m LA Biplane Vol: 101.0 ml 48.11 ml/m  AORTIC VALVE LVOT Vmax:   142.00 cm/s LVOT Vmean:  90.900 cm/s LVOT VTI:    0.277 m AI PHT:      567 msec  AORTA Ao Root diam: 3.50 cm Ao Asc diam:  3.90 cm MITRAL VALVE               TRICUSPID VALVE                            TR Peak grad:   14.0 mmHg                            TR Vmax:        187.00 cm/s MV E velocity: 71.10 cm/s  Estimated RAP:  3.00 mmHg MV A velocity: 34.70 cm/s  RVSP:           17.0 mmHg MV E/A ratio:  2.05                            SHUNTS                            Systemic VTI:  0.28 m  Systemic Diam: 2.00 cm Skeet Latch MD Electronically signed by Skeet Latch MD Signature Date/Time: 12/08/2020/5:23:33 PM    Final     Procedures Procedures (including critical care time)  Medications Ordered in ED Medications  albuterol (VENTOLIN HFA) 108 (90 Base) MCG/ACT inhaler 2 puff (2 puffs Inhalation Given 12/09/20 1458)  AeroChamber Plus Flo-Vu Medium MISC 1 each (1 each Other Given 12/09/20 1501)    ED Course  I have reviewed the triage vital signs and the nursing notes.  Pertinent labs & imaging results that were available during my care of the patient were reviewed by me and considered in my medical decision making (see chart for details).  Clinical Course as of 12/10/20 0018  Sat Dec 09, 2020  1448 Patient reevaluated, after albuterol wheezing has  improved significantly.  I discussed role of additional evaluation with patient at this point is in agreement with open off on blood work. [EH]    Clinical Course User Index [EH] Ollen Gross   MDM Rules/Calculators/A&P                         Patient is a 60 year old man who presents today for evaluation of ongoing cough. He is afebrile, not tachycardic or tachypneic.  Chest x-ray is obtained and is reassuring. He is fully vaccinated against COVID-19 however he is also immunosuppressed as he received a living kidney transplant. On exam he has mild diffuse wheezing bilaterally.  He is given albuterol after which this improved. Given the patient's isolated cough and wheezing, I discussed with him the role of blood work which at this point, shared decision making and blood work is not ordered.  Patient is well connected in the healthcare system and has close outpatient follow-up if needed. Covid test is sent. Patient does have bilateral lower extremity pitting edema however he reports that that is baseline and is unchanged.  He had a echo obtained yesterday that did not show any significant systolic dysfunction and chest x-ray does not show pulmonary edema therefore doubt CHF as underlying cause.  Given the patient is immunosuppressed with wheezing and history of smoking I suspect that he may have underlying lung damage from his history of smoking and may have an underlying COPD picture.  Given this, along with patient's immunosuppression, he is given a prescription for doxycycline.  Hason Saeteurn Le[place]bury was evaluated in Emergency Department on 12/10/2020 for the symptoms described in the history of present illness. He was evaluated in the context of the global COVID-19 pandemic, which necessitated consideration that the patient might be at risk for infection with the SARS-CoV-2 virus that causes COVID-19. Institutional protocols and algorithms that pertain to the evaluation of patients at risk  for COVID-19 are in a state of rapid change based on information released by regulatory bodies including the CDC and federal and state organizations. These policies and algorithms were followed during the patient's care in the ED.  Return precautions were discussed with patient who states their understanding.  At the time of discharge patient denied any unaddressed complaints or concerns.  Patient is agreeable for discharge home.  Note: Portions of this report may have been transcribed using voice recognition software. Every effort was made to ensure accuracy; however, inadvertent computerized transcription errors may be present  Final Clinical Impression(s) / ED Diagnoses Final diagnoses:  Cough    Rx / DC Orders ED Discharge Orders         Ordered  doxycycline (VIBRAMYCIN) 100 MG capsule  2 times daily        12/09/20 1554    benzonatate (TESSALON) 100 MG capsule  Every 8 hours        12/09/20 [place], Star Cheese W, Hershal Coria 12/10/20 0021    Lennice Sites, DO 12/10/20 1457

## 2020-12-10 LAB — SARS CORONAVIRUS 2 (TAT 6-24 HRS): SARS Coronavirus 2: NEGATIVE

## 2020-12-14 DIAGNOSIS — N400 Enlarged prostate without lower urinary tract symptoms: Secondary | ICD-10-CM | POA: Diagnosis not present

## 2020-12-21 ENCOUNTER — Other Ambulatory Visit (HOSPITAL_COMMUNITY): Payer: Self-pay | Admitting: Urology

## 2020-12-21 DIAGNOSIS — N5201 Erectile dysfunction due to arterial insufficiency: Secondary | ICD-10-CM | POA: Diagnosis not present

## 2020-12-21 DIAGNOSIS — R3129 Other microscopic hematuria: Secondary | ICD-10-CM | POA: Diagnosis not present

## 2020-12-21 DIAGNOSIS — R3915 Urgency of urination: Secondary | ICD-10-CM | POA: Diagnosis not present

## 2020-12-21 DIAGNOSIS — N401 Enlarged prostate with lower urinary tract symptoms: Secondary | ICD-10-CM | POA: Diagnosis not present

## 2020-12-22 ENCOUNTER — Other Ambulatory Visit (HOSPITAL_COMMUNITY): Payer: Self-pay | Admitting: Nurse Practitioner

## 2020-12-22 ENCOUNTER — Other Ambulatory Visit (HOSPITAL_COMMUNITY): Payer: Self-pay | Admitting: Nephrology

## 2020-12-22 MED FILL — MYCOPHENOLATE SODIUM 180 MG: 180 | 30 days supply | Qty: 240 | Fill #0

## 2020-12-22 MED FILL — FENOFIBRATE 134 MG CAPSULE: 134 | 30 days supply | Qty: 30 | Fill #1

## 2020-12-22 MED FILL — METOPROLOL TARTRATE 25 MG T: 25 | 30 days supply | Qty: 30 | Fill #0

## 2020-12-22 MED FILL — TACROLIMUS 1 MG CAPS: 1 | 30 days supply | Qty: 30 | Fill #4

## 2020-12-28 NOTE — Progress Notes (Signed)
Cardiology Office Note Date:  01/02/2021  Patient ID:  [name], DOB 10/17/1961, MRN [id number] PCP:  Patient, No Pcp Per  Cardiologist:  Dr. Johnsie Cancel Electrophysiologist: Dr. Caryl Comes    Chief Complaint: CRF, CABG, PAF  History of Present Illness: [name] is a 60 y.o. male with history of ESRF now s/p kidney transplant (2014) chronically on immunosupression as well as steroids with rejection issues intermittently, CAD w/CABG (2013) with p/o AFib, PAFlutter (flutter ablated in 2013), HTN, HLD, OSA not using CPAP, reported improved after significant weight loss,  and CVA, HCM, hx pf erythrocytosis w/phlebotomy historically  Has had multiple ablations and issues with recurrent tachy brady syndrome Is currently on low dose lopressor and unable To wean off amiodarone lower than 100 mg daily EP NP discussed fact that AV node modification and pacing not ideal Given immunosuppression He had flutter ablation 2013, PvI/CTI [place] 2018 and PVI ablation June 2021 [place] at [facility]  He is still bothered 3-4/x week with likely PAF with rates in 120-140 bpm lasting minutes to hours. He has appointment With [facility] in 2 weeks or so   Past Medical History:  Diagnosis Date  . Arteriovenous fistula, acquired ([place])   . Atrial fibrillation and flutter ([place])   . Bladder stones   . BMI 31.0-31.9,adult   . CAD (coronary artery disease)    a. s/p CABG 03/2012: LIMA to LAD, free RIMA to OM1, SVG to D1, sequential SVG to AM and LPLB2, EVH via right thigh and leg.  . Cellulitis 04/13/2012   RLE saphenous vein harvest incision   . CKD (chronic kidney disease)   . Dyslipidemia   . Gout   . History of kidney transplant   . Hyperglycemia   . Hyperlipidemia   . Hyperparathyroidism   . Hypertension   . Hypertrophic cardiomyopathy ([place])   . Hypomagnesemia   . Immunosuppression ([place])   . Intermittent palpitations   . Microscopic hematuria   . Migraines   . PAF and Flutter    a. Afib post-op CABG; AFlutter  10/2012  . Peripheral vascular disease ([place])   . Post-transplant erythrocytosis   . Renal disease   . S/P living-donor kidney transplantation   . Sinus node dysfunction-post termination pause    a. >8sec in setting of aflutter 10/2012  . Sleep pattern disturbance   . Stroke [facility]) 1995; 2001   denies residual  . Superficial vein thrombosis    a. R greater saphenous 04/2012  . Vitamin D deficiency   . VT (ventricular tachycardia) ([place])     Past Surgical History:  Procedure Laterality Date  . ATRIAL FIBRILLATION ABLATION N/A 08/21/2017   Procedure: Atrial Fibrillation Ablation;  Surgeon: [name], MD;  Location: [place] CV LAB;  Service: Cardiovascular;  Laterality: N/A;  . ATRIAL FLUTTER ABLATION N/A 11/09/2012   Procedure: ATRIAL FLUTTER ABLATION;  Surgeon: [name], MD;  Location: [facility] CATH LAB;  Service: Cardiovascular;  Laterality: N/A;  . AV FISTULA PLACEMENT  2011   left forearm  . AV FISTULA PLACEMENT    . CARDIAC CATHETERIZATION  03/16/12  . CARDIOVERSION N/A 06/10/2017   Procedure: CARDIOVERSION;  Surgeon: Pixie Casino, MD;  Location: [facility] ENDOSCOPY;  Service: Cardiovascular;  Laterality: N/A;  . CARDIOVERSION N/A 10/13/2017   Procedure: CARDIOVERSION;  Surgeon: Dorothy Spark, MD;  Location: [facility] ENDOSCOPY;  Service: Cardiovascular;  Laterality: N/A;  . CARDIOVERSION N/A 10/08/2018   Procedure: CARDIOVERSION;  Surgeon: Larey Dresser, MD;  Location: [facility]  ENDOSCOPY;  Service: Cardiovascular;  Laterality: N/A;  . CARDIOVERSION N/A 04/27/2019   Procedure: CARDIOVERSION;  Surgeon: Dorothy Spark, MD;  Location: [facility] ENDOSCOPY;  Service: Cardiovascular;  Laterality: N/A;  . CARDIOVERSION N/A 02/24/2020   Procedure: CARDIOVERSION;  Surgeon: Donato Heinz, MD;  Location: [place];  Service: Cardiovascular;  Laterality: N/A;  . CORONARY ARTERY BYPASS GRAFT  03/19/2012   Procedure: CORONARY ARTERY BYPASS GRAFTING (CABG);  Surgeon: Rexene Alberts, MD;   Location: [place];  Service: Open Heart Surgery;  Laterality: N/A;  (B) MAMMARY  . CYSTOSCOPY WITH BIOPSY N/A 08/15/2016   Procedure: CYSTOSCOPY WITH IDENTIFICATION OF RIGHT TRANSPLANTATION OF URETRAL ORFICE WITH FLUORESCEIN;  Surgeon: Carolan Clines, MD;  Location: [facility] ORS;  Service: Urology;  Laterality: N/A;  . DENTAL SURGERY  2008   multiple  . [place]   left  . FRACTURE SURGERY    . HERNIA REPAIR  38/1017   umbilical  . HOLMIUM LASER APPLICATION N/A 04/10/2584   Procedure: HOLMIUM LASER APPLICATION WITH 3 CM RIGHT BLADDER STONE;  Surgeon: Carolan Clines, MD;  Location: [facility] ORS;  Service: Urology;  Laterality: N/A;  . INGUINAL HERNIA REPAIR  09/2009   bilaterally  . KIDNEY TRANSPLANT    . LEFT HEART CATH AND CORS/GRAFTS ANGIOGRAPHY N/A 07/27/2019   Procedure: LEFT HEART CATH AND CORS/GRAFTS ANGIOGRAPHY;  Surgeon: Sherren Mocha, MD;  Location: [place] CV LAB;  Service: Cardiovascular;  Laterality: N/A;  . LEFT HEART CATHETERIZATION WITH CORONARY ANGIOGRAM N/A 03/16/2012   Procedure: LEFT HEART CATHETERIZATION WITH CORONARY ANGIOGRAM;  Surgeon: Sherren Mocha, MD;  Location: [facility] CATH LAB;  Service: Cardiovascular;  Laterality: N/A;  . RENAL ARTERY STENT  2001  . TEE WITHOUT CARDIOVERSION N/A 08/21/2017   Procedure: TRANSESOPHAGEAL ECHOCARDIOGRAM (TEE);  Surgeon: Larey Dresser, MD;  Location: [facility] ENDOSCOPY;  Service: Cardiovascular;  Laterality: N/A;  . TRANSURETHRAL RESECTION OF BLADDER TUMOR N/A 08/15/2016   Procedure: TRANSURETHRAL BIOPSY OF RIGHT BLADDER WALL;  Surgeon: Carolan Clines, MD;  Location: [facility];  Service: Urology;  Laterality: N/A;    Current Outpatient Medications  Medication Sig Dispense Refill  . allopurinol (ZYLOPRIM) 100 MG tablet Take 200 mg by mouth daily.    Marland Kitchen amiodarone (PACERONE) 200 MG tablet Take 0.5 tablets (100 mg total) by mouth daily. (Patient taking differently: Take 200 mg by mouth daily.) 45 tablet 3  . amLODipine  (NORVASC) 5 MG tablet Take 1 tablet (5 mg total) by mouth daily. 90 tablet 3  . benzonatate (TESSALON) 100 MG capsule Take 1 capsule (100 mg total) by mouth every 8 (eight) hours. 21 capsule 0  . cloNIDine (CATAPRES) 0.1 MG tablet Take 0.1 mg by mouth daily.    Marland Kitchen diltiazem (CARDIZEM) 60 MG tablet Take 1 tablet as needed every 6 hours for rapid afib HR over 100. (Patient taking differently: Take 60 mg by mouth every 6 (six) hours as needed (rapid afib HR over 100).) 45 tablet 1  . ELIQUIS 5 MG TABS tablet TAKE 1 TABLET BY MOUTH TWICE A DAY 60 tablet 5  . fenofibrate micronized (LOFIBRA) 134 MG capsule Take 134 mg by mouth daily.     . fluticasone (FLONASE) 50 MCG/ACT nasal spray Place 1 spray into both nostrils as needed for allergies or rhinitis.    . magnesium oxide (MAG-OX) 400 (241.3 Mg) MG tablet Take 400 mg by mouth 2 (two) times daily.    . metoprolol tartrate (LOPRESSOR) 25 MG tablet TAKE 1/2 TABLET BY MOUTH  TWICE DAILY. 30 tablet 3  . metoprolol tartrate (LOPRESSOR) 50 MG tablet Take 25 mg by mouth 2 (two) times daily.    . mycophenolate (MYFORTIC) 180 MG EC tablet Take 720 mg by mouth 2 (two) times daily.     Marland Kitchen omeprazole (PRILOSEC) 20 MG capsule Take 20 mg by mouth daily.  5  . predniSONE (DELTASONE) 5 MG tablet Take 5 mg by mouth at bedtime.    . rosuvastatin (CRESTOR) 20 MG tablet TAKE 1 TABLET BY MOUTH DAILY. (Patient taking differently: Taking 20mg  by mouth 4 times a week) 90 tablet 2  . sulfamethoxazole-trimethoprim (BACTRIM,SEPTRA) 400-80 MG tablet Take 1 tablet by mouth every Monday, Wednesday, and Friday.  6  . tacrolimus (PROGRAF) 1 MG capsule Taking 1 tablet by mouth in the am and 1/2 tablet in the pm    . tadalafil (CIALIS) 5 MG tablet Take 5 mg by mouth at bedtime.   11  . VASCEPA 1 g capsule TAKE 2 CAPSULES BY MOUTH TWICE DAILY. 120 capsule 9   No current facility-administered medications for this visit.    Allergies:   Iodinated diagnostic agents, Penicillins, and  Lisinopril   Social History:  The patient  reports that he quit smoking about 8 years ago. His smoking use included cigarettes. He has a 9.00 pack-year smoking history. He has never used smokeless tobacco. He reports current alcohol use. He reports that he does not use drugs.   Family History:  The patient's family history includes Hypertension in an other family member. He was adopted.  ROS:  Please see the history of present illness.  All other systems are reviewed and otherwise negative.   PHYSICAL EXAM:  VS:  BP 130/82   Pulse (!) 53   Ht 5\' 8"  (1.727 m)   Wt 96.2 kg   SpO2 98%   BMI 32.23 kg/m  BMI: Body mass index is 32.23 kg/m.  Affect appropriate Healthy:  appears stated age 75: normal Neck supple with no adenopathy JVP normal no bruits no thyromegaly Lungs clear with no wheezing and good diaphragmatic motion Heart:  S1/S2 no murmur, no rub, gallop or click PMI normal Abdomen: benighn, kidney transplant  Distal pulses intact with no bruits No edema Neuro non-focal Skin warm and dry No muscular weakness    EKG:  SR rate 60 PAC LBBB 12/11/20   05/25/2020: EPS/Ablation (Dr. Rhae Hammock, Dr. Lenard Galloway) Findings:  -Baseline isolation of left inferior pulmonary veins and CTI with  reconnection of other pulmonary veins -Atrial fibrillation transiently induced; no atypical atrial flutter  induced -Successful re-isolation all pulmonary veins -Successful isolation of the posterior left atrium with linear ablation  and CFAE ablation    07/27/2019: LHC 1. Severe native vessel CAD with total occlusion of the proximal LAD, total occlusion of the non-dominant RCA, and severe stenosis of the first OM branch of the circumflex. 2. Mildly elevated LVEDP 3. S/P CABG with continued patency of the LIMA-LAD, free RIMA-OM, SVG-diagonal, and SVG-acute marginal. Occlusion of the sequence graft to the left posterolateral branch  Recommend: medical therapy - no targets for PCI.  Resume apixaban tomorrow   08/21/2017: EPS/Ablation CONCLUSIONS: 1. Sinus rhythm upon presentation.   2. Intracardiac echo reveals a moderately enlarged left atrium with four separate pulmonary veins without evidence of pulmonary vein stenosis. 3. Successful electrical isolation and anatomical encircling of all four pulmonary veins with radiofrequency current. 4. Atypical atrial flutter not inducible today and therefore not amenable to mapping or ablation 5. Complete bidirectionaly isthmus  block from prior CTI ablation confirmed 6. Very frequent and multifocal PACs suggest non PV source for afib.  Despite extensive mapping, there were no dominant PACs that could be ablated today.  I am doubtful that additional ablation would be beneficial in the future.  7. No early apparent complications.    03/02/17: TTE Study Conclusions - Left ventricle: Inferobasal hypokinesis The cavity size was   normal. Wall thickness was increased in a pattern of mild LVH.   Systolic function was normal. The estimated ejection fraction was   in the range of 55% to 60%. - Aortic valve: There was mild regurgitation. - Mitral valve: There was mild regurgitation. - Atrial septum: No defect or patent foramen ovale was identified. - Impressions: Abnormal GLS -11. Impressions: - Abnormal GLS -11.   Recent Labs: 11/16/2020: ALT 27; BUN 21; Creatinine, Ser 1.73; Hemoglobin 15.8; Platelets 148; Potassium 4.8; Sodium 140  No results found for requested labs within last 8760 hours.   CrCl cannot be calculated (Patient's most recent lab result is older than the maximum 21 days allowed.).   Wt Readings from Last 3 Encounters:  01/02/21 96.2 kg  12/09/20 93 kg  11/16/20 96.6 kg     Other studies reviewed: Additional studies/records reviewed today include: summarized above  ASSESSMENT AND PLAN:  1. Persistent Afib, atypical AFlutter     F/u Dr Caryl Comes not ideal to be on amiodarone given relatively young age and  side effect profile ? Alternative      Continue eliquis and low dose beta blocker Check TSH/T4/T3 today 14 day monitor to document PAF     F/U [facility] to discuss options I discussed patient with him today to give him a heads up  His hypertrophic DCM     May limit choices for AAT as well as CAD LA is severely dilated on echo again making maintenance of NSR     Difficult   2. CAD     s/p cath 2020 noted patent LIMA to LAD, RIMA to OM, SVG D1 and SVG AM sequence to PLB occluded     Continue medical Rx   3. HTN     Well controlled.  Continue current medications and low sodium Dash type diet.    4. Renal Transplant:  On immunosuppression Cr 1.73     Disposition: F/U [facility] for arrhythmia issues 01/23/21 f/u with me in 7 months  14 day monitor , Thyroid labs     Current medicines are reviewed at length with the patient today.  The patient did not have any concerns regarding medicines.  Jenkins Rouge

## 2020-12-29 ENCOUNTER — Other Ambulatory Visit (HOSPITAL_COMMUNITY): Payer: Self-pay | Admitting: Nephrology

## 2020-12-29 MED FILL — predniSONE 5 MG TABS: 5 | 30 days supply | Qty: 30 | Fill #3

## 2020-12-29 MED FILL — VASCEPA 1 GM CAPSULE: 1 | 30 days supply | Qty: 120 | Fill #6

## 2020-12-29 MED FILL — ALLOPURINOL 100 MG TABS: 100 | 30 days supply | Qty: 60 | Fill #3

## 2020-12-29 MED FILL — ELIQUIS 5 MG TABLET: 5 | 90 days supply | Qty: 180 | Fill #0

## 2021-01-02 ENCOUNTER — Other Ambulatory Visit: Payer: Self-pay

## 2021-01-02 ENCOUNTER — Ambulatory Visit (INDEPENDENT_AMBULATORY_CARE_PROVIDER_SITE_OTHER): Payer: 59 | Admitting: Cardiovascular Disease

## 2021-01-02 ENCOUNTER — Ambulatory Visit (INDEPENDENT_AMBULATORY_CARE_PROVIDER_SITE_OTHER): Payer: 59

## 2021-01-02 ENCOUNTER — Encounter: Payer: Self-pay | Admitting: Cardiovascular Disease

## 2021-01-02 VITALS — BP 130/82 | HR 53 | Ht 68.0 in | Wt 212.0 lb

## 2021-01-02 DIAGNOSIS — Z79899 Other long term (current) drug therapy: Secondary | ICD-10-CM

## 2021-01-02 DIAGNOSIS — I480 Paroxysmal atrial fibrillation: Secondary | ICD-10-CM

## 2021-01-02 MED FILL — TADALAFIL 5 MG TABS: 5 | 90 days supply | Qty: 90 | Fill #0

## 2021-01-02 NOTE — Patient Instructions (Addendum)
Medication Instructions:  *If you need a refill on your cardiac medications before your next appointment, please call your pharmacy*  Lab Work: Your physician recommends that you have lab work today- TSH, T4 and T3  If you have labs (blood work) drawn today and your tests are completely normal, you will receive your results only by: Marland Kitchen MyChart Message (if you have MyChart) OR . A paper copy in the mail If you have any lab test that is abnormal or we need to change your treatment, we will call you to review the results.  Testing/Procedures: [place] [place]- Long Term Monitor Instructions   Your physician has requested you wear your ZIO patch monitor___14____days.   This is a single patch monitor.  [employer] supplies one patch monitor per enrollment.  Additional stickers are not available.   Please do not apply patch if you will be having a Nuclear Stress Test, Echocardiogram, Cardiac CT, MRI, or Chest Xray during the time frame you would be wearing the monitor. The patch cannot be worn during these tests.  You cannot remove and re-apply the ZIO XT patch monitor.   Your ZIO patch monitor will be sent USPS Priority mail from [facility] directly to your home address. The monitor may also be mailed to a PO BOX if home delivery is not available.   It may take 3-5 days to receive your monitor after you have been enrolled.   Once you have received you monitor, please review enclosed instructions.  Your monitor has already been registered assigning a specific monitor serial # to you.   Applying the monitor   Shave hair from upper left chest.   Hold abrader disc by orange tab.  Rub abrader in 40 strokes over left upper chest as indicated in your monitor instructions.   Clean area with 4 enclosed alcohol pads .  Use all pads to assure are is cleaned thoroughly.  Let dry.   Apply patch as indicated in monitor instructions.  Patch will be place under collarbone on left side of chest with arrow  pointing upward.   Rub patch adhesive wings for 2 minutes.Remove white label marked "1".  Remove white label marked "2".  Rub patch adhesive wings for 2 additional minutes.   While looking in a mirror, press and release button in center of patch.  A small green light will flash 3-4 times .  This will be your only indicator the monitor has been turned on.     Do not shower for the first 24 hours.  You may shower after the first 24 hours.   Press button if you feel a symptom. You will hear a small click.  Record Date, Time and Symptom in the Patient Log Book.   When you are ready to remove patch, follow instructions on last 2 pages of Patient Log Book.  Stick patch monitor onto last page of Patient Log Book.   Place Patient Log Book in [place] box.  Use locking tab on box and tape box closed securely.  The [employer] and [employer] has [employer] on it.  Please place in mailbox as soon as possible.  Your physician should have your test results approximately 7 days after the monitor has been mailed back to [facility].   Call [place] at [phone] if you have questions regarding your ZIO XT patch monitor.  Call them immediately if you see an orange light blinking on your monitor.   If your monitor falls off in  less than 4 days contact our Monitor department at [phone].  If your monitor becomes loose or falls off after 4 days call [employer] at [phone] for suggestions on securing your monitor.   Follow-Up: At [facility], you and your health needs are our priority.  As part of our continuing mission to provide you with exceptional heart care, we have created designated Provider Care Teams.  These Care Teams include your primary Cardiologist (physician) and Advanced Practice Providers (APPs -  Physician Assistants and Nurse Practitioners) who all work together to provide you with the care you need, when you need it.  We recommend signing up for the patient portal  called "MyChart".  Sign up information is provided on this After Visit Summary.  MyChart is used to connect with patients for Virtual Visits (Telemedicine).  Patients are able to view lab/test results, encounter notes, upcoming appointments, etc.  Non-urgent messages can be sent to your provider as well.   To learn more about what you can do with MyChart, go to [email or web address].    Your next appointment:   6 month(s)  The format for your next appointment:   In Person  Provider:   You may see Dr. Johnsie Cancel or one of the following Advanced Practice Providers on your designated Care Team:    Kathyrn Drown, NP

## 2021-01-03 LAB — TSH: TSH: 2.04 u[IU]/mL (ref 0.450–4.500)

## 2021-01-03 LAB — T4, FREE: Free T4: 1.57 ng/dL (ref 0.82–1.77)

## 2021-01-03 LAB — T3, FREE: T3, Free: 1.9 pg/mL — ABNORMAL LOW (ref 2.0–4.4)

## 2021-01-05 MED FILL — OMEPRAZOLE DR 20 MG CAPSULE: 20 | 30 days supply | Qty: 30 | Fill #4

## 2021-01-05 MED FILL — SULFAMETHOXAZOLE-TMP SS TAB: 400-80 | 28 days supply | Qty: 12 | Fill #4

## 2021-01-11 ENCOUNTER — Emergency Department (HOSPITAL_BASED_OUTPATIENT_CLINIC_OR_DEPARTMENT_OTHER)
Admission: EM | Admit: 2021-01-11 | Discharge: 2021-01-11 | Disposition: A | Discharge status: Home / Self Care | Payer: 59 | Attending: Emergency Medicine | Admitting: Emergency Medicine

## 2021-01-11 ENCOUNTER — Other Ambulatory Visit: Payer: Self-pay

## 2021-01-11 ENCOUNTER — Encounter (HOSPITAL_BASED_OUTPATIENT_CLINIC_OR_DEPARTMENT_OTHER): Payer: Self-pay | Admitting: Emergency Medicine

## 2021-01-11 ENCOUNTER — Emergency Department (HOSPITAL_BASED_OUTPATIENT_CLINIC_OR_DEPARTMENT_OTHER): Payer: 59

## 2021-01-11 DIAGNOSIS — S63502A Unspecified sprain of left wrist, initial encounter: Secondary | ICD-10-CM | POA: Diagnosis not present

## 2021-01-11 DIAGNOSIS — Z79899 Other long term (current) drug therapy: Secondary | ICD-10-CM | POA: Insufficient documentation

## 2021-01-11 DIAGNOSIS — Z87891 Personal history of nicotine dependence: Secondary | ICD-10-CM | POA: Insufficient documentation

## 2021-01-11 DIAGNOSIS — Y92410 Unspecified street and highway as the place of occurrence of the external cause: Secondary | ICD-10-CM | POA: Diagnosis not present

## 2021-01-11 DIAGNOSIS — S46911A Strain of unspecified muscle, fascia and tendon at shoulder and upper arm level, right arm, initial encounter: Secondary | ICD-10-CM | POA: Diagnosis not present

## 2021-01-11 DIAGNOSIS — N183 Chronic kidney disease, stage 3 unspecified: Secondary | ICD-10-CM | POA: Diagnosis not present

## 2021-01-11 DIAGNOSIS — S39012A Strain of muscle, fascia and tendon of lower back, initial encounter: Secondary | ICD-10-CM | POA: Insufficient documentation

## 2021-01-11 DIAGNOSIS — S161XXA Strain of muscle, fascia and tendon at neck level, initial encounter: Secondary | ICD-10-CM | POA: Insufficient documentation

## 2021-01-11 DIAGNOSIS — Z951 Presence of aortocoronary bypass graft: Secondary | ICD-10-CM | POA: Diagnosis not present

## 2021-01-11 DIAGNOSIS — Z041 Encounter for examination and observation following transport accident: Secondary | ICD-10-CM | POA: Diagnosis not present

## 2021-01-11 DIAGNOSIS — Z7901 Long term (current) use of anticoagulants: Secondary | ICD-10-CM | POA: Diagnosis not present

## 2021-01-11 DIAGNOSIS — I2510 Atherosclerotic heart disease of native coronary artery without angina pectoris: Secondary | ICD-10-CM | POA: Diagnosis not present

## 2021-01-11 DIAGNOSIS — I129 Hypertensive chronic kidney disease with stage 1 through stage 4 chronic kidney disease, or unspecified chronic kidney disease: Secondary | ICD-10-CM | POA: Insufficient documentation

## 2021-01-11 DIAGNOSIS — S6992XA Unspecified injury of left wrist, hand and finger(s), initial encounter: Secondary | ICD-10-CM | POA: Diagnosis present

## 2021-01-11 DIAGNOSIS — M7989 Other specified soft tissue disorders: Secondary | ICD-10-CM | POA: Diagnosis not present

## 2021-01-11 DIAGNOSIS — Z940 Kidney transplant status: Secondary | ICD-10-CM | POA: Diagnosis not present

## 2021-01-11 DIAGNOSIS — Z8673 Personal history of transient ischemic attack (TIA), and cerebral infarction without residual deficits: Secondary | ICD-10-CM | POA: Diagnosis not present

## 2021-01-11 DIAGNOSIS — I119 Hypertensive heart disease without heart failure: Secondary | ICD-10-CM | POA: Diagnosis not present

## 2021-01-11 MED ORDER — METHOCARBAMOL 500 MG PO TABS: 500.0000 mg | ORAL_TABLET | Freq: Three times a day (TID) | ORAL | 0 refills | Status: DC | PRN
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
## 2021-01-12 MED FILL — CloNIDine HCL 0.1 MG TAB: 0.1 | 90 days supply | Qty: 90 | Fill #2

## 2021-01-18 ENCOUNTER — Other Ambulatory Visit (HOSPITAL_COMMUNITY): Payer: Self-pay | Admitting: Physician Assistant

## 2021-01-18 DIAGNOSIS — M25532 Pain in left wrist: Secondary | ICD-10-CM | POA: Diagnosis not present

## 2021-01-18 MED FILL — traMADol HCL 50 MG TABS: 50 | 5 days supply | Qty: 30 | Fill #0

## 2021-01-19 MED FILL — FENOFIBRATE 134 MG CAPSULE: 134 | 30 days supply | Qty: 30 | Fill #2

## 2021-01-19 MED FILL — METOPROLOL TARTRATE 25 MG T: 25 | 30 days supply | Qty: 30 | Fill #1

## 2021-01-19 MED FILL — TACROLIMUS 1 MG CAPS: 1 | 30 days supply | Qty: 30 | Fill #5

## 2021-01-22 ENCOUNTER — Ambulatory Visit: Payer: 59 | Admitting: Internal Medicine

## 2021-01-22 ENCOUNTER — Other Ambulatory Visit: Payer: 59

## 2021-01-24 DIAGNOSIS — I480 Paroxysmal atrial fibrillation: Secondary | ICD-10-CM | POA: Diagnosis not present

## 2021-01-26 MED FILL — MYCOPHENOLATE SODIUM 180 MG: 180 | 30 days supply | Qty: 240 | Fill #1

## 2021-01-26 MED FILL — predniSONE 5 MG TABS: 5 | 30 days supply | Qty: 30 | Fill #4

## 2021-01-26 MED FILL — TACROLIMUS ANHYDROUS 0.5MG: 0.5 | 30 days supply | Qty: 30 | Fill #7

## 2021-01-30 DIAGNOSIS — R001 Bradycardia, unspecified: Secondary | ICD-10-CM | POA: Insufficient documentation

## 2021-01-30 DIAGNOSIS — I491 Atrial premature depolarization: Secondary | ICD-10-CM | POA: Insufficient documentation

## 2021-02-02 ENCOUNTER — Ambulatory Visit (INDEPENDENT_AMBULATORY_CARE_PROVIDER_SITE_OTHER): Payer: 59 | Admitting: Internal Medicine

## 2021-02-02 ENCOUNTER — Encounter: Payer: Self-pay | Admitting: Internal Medicine

## 2021-02-02 ENCOUNTER — Other Ambulatory Visit: Payer: 59 | Admitting: *Deleted

## 2021-02-02 ENCOUNTER — Other Ambulatory Visit: Payer: Self-pay

## 2021-02-02 ENCOUNTER — Other Ambulatory Visit: Payer: Self-pay | Admitting: Internal Medicine

## 2021-02-02 VITALS — BP 120/80 | HR 49 | Ht 68.0 in | Wt 213.0 lb

## 2021-02-02 DIAGNOSIS — Z79899 Other long term (current) drug therapy: Secondary | ICD-10-CM | POA: Diagnosis not present

## 2021-02-02 DIAGNOSIS — I491 Atrial premature depolarization: Secondary | ICD-10-CM | POA: Diagnosis not present

## 2021-02-02 DIAGNOSIS — R001 Bradycardia, unspecified: Secondary | ICD-10-CM

## 2021-02-02 DIAGNOSIS — I4892 Unspecified atrial flutter: Secondary | ICD-10-CM

## 2021-02-02 MED ORDER — AMIODARONE HCL 200 MG PO TABS: ORAL_TABLET | ORAL | 3 refills | Status: DC

## 2021-02-02 MED ORDER — AMLODIPINE BESYLATE 2.5 MG PO TABS: 2.5000 mg | ORAL_TABLET | Freq: Every day | ORAL | 3 refills | Status: DC

## 2021-02-02 MED FILL — SULFAMETHOXAZOLE-TMP SS TAB: 400-80 | 28 days supply | Qty: 12 | Fill #5

## 2021-02-02 MED FILL — VASCEPA 1 GM CAPSULE: 1 | 30 days supply | Qty: 120 | Fill #7

## 2021-02-02 MED FILL — AMLODIPINE 2.5 MG TABLET: 2.5 | 90 days supply | Qty: 90 | Fill #0

## 2021-02-02 MED FILL — AMIODARONE HCL 200 MG TAB: 200 | 62 days supply | Qty: 90 | Fill #0

## 2021-02-02 MED FILL — METOPROLOL TARTRATE 50 MG T: 50 | 90 days supply | Qty: 180 | Fill #1

## 2021-02-02 NOTE — Progress Notes (Signed)
.    A serie  Patient Care Team: Patient, No Pcp Per as PCP - General (General Practice)   HPI  [name] is a 60 y.o. male Seen in followup for concerns about atrial fibrillation in the context of significant left atrial enlargement prior atrial flutter for which he underwent catheter ablation 2013 and left ventricular hypertrophy.  Presumed HCM.  Spoke with Dr. Broadus John.  Has deferred genetic testing.  Ischemic heart disease with prior bypass surgery 4/13 and had post CABG atrial fibrillation.   Atypical and typical flutter but amplitudes particularly in lead V1 which are very atypical.  He saw Dr. Greggory Brandy and underwent catheter ablation for atrial flutter as well as atrial fibrillation.  9/18.. Atrial flutter was noninducible.  Pulmonary vein isolation was accomplished.  Multiple PACs.  Further repeat ablation was not recommended   Now maintained on  Amio;as undergone interval ablation by Dr Jefm Miles.   But noted no interval benefit in his patient experience overall was really quite negative  Maintained on amio   Patient denies symptoms of GI intolerance, sun sensitivity, neurological symptoms attributable to amiodarone.     Modest DOE some edema  Snoring but neg sleep study   Has hx of remote stroke      DATE TEST EF   9/15 Echo   55-65 % LVH severe LAE (53/2.4)  7/16 cMRI   ASH Mid--apical  cavitary obliteration  No contrast 2/2 renal disease  9/18 TEE 65% HCM   1/22 Echo  70-75%    Date Cr K TSH LFTs Hgb  9/17 1.5 4.6      10/19 1.5 4.1   17  5/20 1.26 4.7   16.1  3/21 1.56 4.6 1.12    2/22 1.73 4.8 2.04 27        He has resting bradycardia  Past Medical History:  Diagnosis Date  . Arteriovenous fistula, acquired ([place])   . Atrial fibrillation and flutter ([place])   . Bladder stones   . BMI 31.0-31.9,adult   . CAD (coronary artery disease)    a. s/p CABG 03/2012: LIMA to LAD, free RIMA to OM1, SVG to D1, sequential SVG to AM and LPLB2, EVH via right thigh and leg.  .  Cellulitis 04/13/2012   RLE saphenous vein harvest incision   . CKD (chronic kidney disease)   . Dyslipidemia   . Gout   . History of kidney transplant   . Hyperglycemia   . Hyperlipidemia   . Hyperparathyroidism   . Hypertension   . Hypertrophic cardiomyopathy ([place])   . Hypomagnesemia   . Immunosuppression ([place])   . Intermittent palpitations   . Microscopic hematuria   . Migraines   . PAF and Flutter    a. Afib post-op CABG; AFlutter 10/2012  . Peripheral vascular disease ([place])   . Post-transplant erythrocytosis   . Renal disease   . S/P living-donor kidney transplantation   . Sinus node dysfunction-post termination pause    a. >8sec in setting of aflutter 10/2012  . Sleep pattern disturbance   . Stroke [facility]) 1995; 2001   denies residual  . Superficial vein thrombosis    a. R greater saphenous 04/2012  . Vitamin D deficiency   . VT (ventricular tachycardia) ([place])     Past Surgical History:  Procedure Laterality Date  . ATRIAL FIBRILLATION ABLATION N/A 08/21/2017   Procedure: Atrial Fibrillation Ablation;  Surgeon: [name], MD;  Location: [place] CV LAB;  Service: Cardiovascular;  Laterality: N/A;  .  ATRIAL FLUTTER ABLATION N/A 11/09/2012   Procedure: ATRIAL FLUTTER ABLATION;  Surgeon: [name], MD;  Location: [facility] CATH LAB;  Service: Cardiovascular;  Laterality: N/A;  . AV FISTULA PLACEMENT  2011   left forearm  . AV FISTULA PLACEMENT    . CARDIAC CATHETERIZATION  03/16/12  . CARDIOVERSION N/A 06/10/2017   Procedure: CARDIOVERSION;  Surgeon: Pixie Casino, MD;  Location: [facility] ENDOSCOPY;  Service: Cardiovascular;  Laterality: N/A;  . CARDIOVERSION N/A 10/13/2017   Procedure: CARDIOVERSION;  Surgeon: Dorothy Spark, MD;  Location: [facility] ENDOSCOPY;  Service: Cardiovascular;  Laterality: N/A;  . CARDIOVERSION N/A 10/08/2018   Procedure: CARDIOVERSION;  Surgeon: Larey Dresser, MD;  Location: [facility] ENDOSCOPY;  Service: Cardiovascular;  Laterality: N/A;  .  CARDIOVERSION N/A 04/27/2019   Procedure: CARDIOVERSION;  Surgeon: Dorothy Spark, MD;  Location: [facility] ENDOSCOPY;  Service: Cardiovascular;  Laterality: N/A;  . CARDIOVERSION N/A 02/24/2020   Procedure: CARDIOVERSION;  Surgeon: Donato Heinz, MD;  Location: [place];  Service: Cardiovascular;  Laterality: N/A;  . CORONARY ARTERY BYPASS GRAFT  03/19/2012   Procedure: CORONARY ARTERY BYPASS GRAFTING (CABG);  Surgeon: Rexene Alberts, MD;  Location: [place];  Service: Open Heart Surgery;  Laterality: N/A;  (B) MAMMARY  . CYSTOSCOPY WITH BIOPSY N/A 08/15/2016   Procedure: CYSTOSCOPY WITH IDENTIFICATION OF RIGHT TRANSPLANTATION OF URETRAL ORFICE WITH FLUORESCEIN;  Surgeon: Carolan Clines, MD;  [facility];  Service: Urology;  Laterality: N/A;  . DENTAL SURGERY  2008   multiple  . [place]   left  . FRACTURE SURGERY    . HERNIA REPAIR  62/2297   umbilical  . HOLMIUM LASER APPLICATION N/A 9/89/2119   Procedure: HOLMIUM LASER APPLICATION WITH 3 CM RIGHT BLADDER STONE;  Surgeon: Carolan Clines, MD;  [facility];  Service: Urology;  Laterality: N/A;  . INGUINAL HERNIA REPAIR  09/2009   bilaterally  . KIDNEY TRANSPLANT    . LEFT HEART CATH AND CORS/GRAFTS ANGIOGRAPHY N/A 07/27/2019   Procedure: LEFT HEART CATH AND CORS/GRAFTS ANGIOGRAPHY;  Surgeon: Sherren Mocha, MD;  Location: [place] CV LAB;  Service: Cardiovascular;  Laterality: N/A;  . LEFT HEART CATHETERIZATION WITH CORONARY ANGIOGRAM N/A 03/16/2012   Procedure: LEFT HEART CATHETERIZATION WITH CORONARY ANGIOGRAM;  Surgeon: Sherren Mocha, MD;  Location: [facility] CATH LAB;  Service: Cardiovascular;  Laterality: N/A;  . RENAL ARTERY STENT  2001  . TEE WITHOUT CARDIOVERSION N/A 08/21/2017   Procedure: TRANSESOPHAGEAL ECHOCARDIOGRAM (TEE);  Surgeon: Larey Dresser, MD;  Location: [facility] ENDOSCOPY;  Service: Cardiovascular;  Laterality: N/A;  . TRANSURETHRAL RESECTION OF BLADDER TUMOR N/A 08/15/2016    Procedure: TRANSURETHRAL BIOPSY OF RIGHT BLADDER WALL;  Surgeon: Carolan Clines, MD;  [facility];  Service: Urology;  Laterality: N/A;    Current Outpatient Medications  Medication Sig Dispense Refill  . allopurinol (ZYLOPRIM) 100 MG tablet Take 200 mg by mouth daily.    Marland Kitchen amiodarone (PACERONE) 200 MG tablet Take 0.5 tablets (100 mg total) by mouth daily. (Patient taking differently: Take 200 mg by mouth daily.) 45 tablet 3  . amLODipine (NORVASC) 5 MG tablet Take 1 tablet (5 mg total) by mouth daily. 90 tablet 3  . cloNIDine (CATAPRES) 0.1 MG tablet Take 0.1 mg by mouth daily.    Marland Kitchen diltiazem (CARDIZEM) 60 MG tablet Take 1 tablet as needed every 6 hours for rapid afib HR over 100. (Patient taking differently: Take 60 mg by mouth every 6 (six) hours as needed (rapid  afib HR over 100).) 45 tablet 1  . ELIQUIS 5 MG TABS tablet TAKE 1 TABLET BY MOUTH TWICE A DAY 60 tablet 5  . fenofibrate micronized (LOFIBRA) 134 MG capsule Take 134 mg by mouth daily.     . fluticasone (FLONASE) 50 MCG/ACT nasal spray Place 1 spray into both nostrils as needed for allergies or rhinitis.    . magnesium oxide (MAG-OX) 400 (241.3 Mg) MG tablet Take 400 mg by mouth 2 (two) times daily.    . metoprolol tartrate (LOPRESSOR) 25 MG tablet TAKE 1/2 TABLET BY MOUTH TWICE DAILY. (Patient taking differently: 25 mg daily.) 30 tablet 3  . metoprolol tartrate (LOPRESSOR) 50 MG tablet Take 50 mg by mouth daily.    . mycophenolate (MYFORTIC) 180 MG EC tablet Take 720 mg by mouth 2 (two) times daily.     Marland Kitchen omeprazole (PRILOSEC) 20 MG capsule Take 20 mg by mouth daily.  5  . predniSONE (DELTASONE) 5 MG tablet Take 5 mg by mouth at bedtime.    . rosuvastatin (CRESTOR) 20 MG tablet TAKE 1 TABLET BY MOUTH DAILY. (Patient taking differently: Taking 20mg  by mouth 4 times a week) 90 tablet 2  . sulfamethoxazole-trimethoprim (BACTRIM,SEPTRA) 400-80 MG tablet Take 1 tablet by mouth every Monday, Wednesday, and Friday.  6  .  tacrolimus (PROGRAF) 0.5 MG capsule daily.    . tacrolimus (PROGRAF) 1 MG capsule Taking 1 tablet by mouth in the am and 1/2 tablet in the pm    . tadalafil (CIALIS) 5 MG tablet Take 5 mg by mouth at bedtime.   11  . VASCEPA 1 g capsule TAKE 2 CAPSULES BY MOUTH TWICE DAILY. 120 capsule 9   No current facility-administered medications for this visit.    Allergies  Allergen Reactions  . Iodinated Diagnostic Agents Other (See Comments) and Shortness Of Breath    Pt states that this medication causes him to code.  Respiratory arrest  . Penicillins Itching, Rash and Other (See Comments)    Has patient had a PCN reaction causing immediate rash, facial/tongue/throat swelling, SOB or lightheadedness with hypotension: No Has patient had a PCN reaction causing severe rash involving mucus membranes or skin necrosis: No Has patient had a PCN reaction that required hospitalization No Has patient had a PCN reaction occurring within the last 10 years: No If all of the above answers are "NO", then may proceed with Cephalosporin use.  Marland Kitchen Lisinopril     Increased Cr      Review of Systems negative except from HPI and PMH  BP 120/80   Pulse (!) 49   Ht 5\' 8"  (1.727 m)   Wt 213 lb (96.6 kg)   SpO2 98%   BMI 32.39 kg/m  Well developed and obesity  in no acute distress HENT normal Neck supple with JVP-  Flat  Clear Regular rate and rhythm, no murmurs or gallops Abd-soft with active BS No Clubbing cyanosis edema Skin-warm and dry A & Oriented  Grossly normal sensory and motor function   ECG sinus @ 49 17/15/55  Assessment and  Plan   Atrial Flutter  S/p RFCA-- recurrent and atypical s/p ablation 2018-JA  Freq PACs  LAE  LVH-Asymmetric 18/11  HCM    CAD s/p CABG 2013  Sleep disordered breathing   Sinus bradycardia  Status post kidney transplant   No real benefit following ablation.  We will plan to increase his amiodarone for control of symptoms--400 daily for a month and  then 200 daily.  We also discussed repeat ablation and alternative sites where his ablation might be done, [place], [facility], [place].  For now we will continue.  He has edema, may be related to amlodipine.  We will decrease 5--2.5  I worry a little bit about his bradycardia in the setting of increasing his amiodarone.  For right now we will hold his beta-blocker at the current dose but he may need to come off of it altogether with alternative therapies for his blood pressure.

## 2021-02-02 NOTE — Patient Instructions (Addendum)
Medication Instructions:  Increase your Amiodarone 400 mg daily for 1 month, then 200 mg a day.  Reduce your Amlodipine to 2.5 mg daily  Your physician recommends that you continue on your current medications as directed. Please refer to the Current Medication list given to you today.  Labwork: None ordered.  Testing/Procedures: None ordered.  Follow-Up: Your physician wants you to follow-up in: 6 months with Virl Axe, MD  appointment.  Any Other Special Instructions Will Be Listed Below (If Applicable).  If you need a refill on your cardiac medications before your next appointment, please call your pharmacy.

## 2021-02-03 LAB — TSH: TSH: 1.72 u[IU]/mL (ref 0.450–4.500)

## 2021-02-08 MED FILL — OMEPRAZOLE DR 20 MG CAPSULE: 20 | 30 days supply | Qty: 30 | Fill #5

## 2021-02-08 MED FILL — ALLOPURINOL 100 MG TABS: 100 | 30 days supply | Qty: 60 | Fill #4

## 2021-02-12 DIAGNOSIS — D751 Secondary polycythemia: Secondary | ICD-10-CM | POA: Diagnosis not present

## 2021-02-12 DIAGNOSIS — E785 Hyperlipidemia, unspecified: Secondary | ICD-10-CM | POA: Diagnosis not present

## 2021-02-12 DIAGNOSIS — I4891 Unspecified atrial fibrillation: Secondary | ICD-10-CM | POA: Diagnosis not present

## 2021-02-12 DIAGNOSIS — I4892 Unspecified atrial flutter: Secondary | ICD-10-CM | POA: Diagnosis not present

## 2021-02-12 DIAGNOSIS — Z940 Kidney transplant status: Secondary | ICD-10-CM | POA: Diagnosis not present

## 2021-02-12 DIAGNOSIS — I2510 Atherosclerotic heart disease of native coronary artery without angina pectoris: Secondary | ICD-10-CM | POA: Diagnosis not present

## 2021-02-12 DIAGNOSIS — M25571 Pain in right ankle and joints of right foot: Secondary | ICD-10-CM | POA: Diagnosis not present

## 2021-02-12 DIAGNOSIS — N400 Enlarged prostate without lower urinary tract symptoms: Secondary | ICD-10-CM | POA: Diagnosis not present

## 2021-02-12 DIAGNOSIS — I129 Hypertensive chronic kidney disease with stage 1 through stage 4 chronic kidney disease, or unspecified chronic kidney disease: Secondary | ICD-10-CM | POA: Diagnosis not present

## 2021-02-16 ENCOUNTER — Other Ambulatory Visit (HOSPITAL_COMMUNITY): Payer: Self-pay | Admitting: Nephrology

## 2021-02-23 MED FILL — predniSONE 5 MG TABS: 5 | 30 days supply | Qty: 30 | Fill #5

## 2021-02-23 MED FILL — MYCOPHENOLATE SODIUM 180 MG: 180 | 30 days supply | Qty: 240 | Fill #2

## 2021-02-23 MED FILL — TACROLIMUS ANHYDROUS 0.5MG: 0.5 | 30 days supply | Qty: 30 | Fill #8

## 2021-03-03 ENCOUNTER — Other Ambulatory Visit (HOSPITAL_COMMUNITY): Payer: Self-pay

## 2021-03-05 ENCOUNTER — Other Ambulatory Visit (HOSPITAL_COMMUNITY): Payer: Self-pay

## 2021-03-05 MED FILL — Allopurinol Tab 100 MG: ORAL | 30 days supply | Qty: 60 | Fill #0 | Status: AC

## 2021-03-05 MED FILL — Icosapent Ethyl Cap 1 GM: ORAL | 30 days supply | Qty: 120 | Fill #0 | Status: AC

## 2021-03-05 MED FILL — Sulfamethoxazole-Trimethoprim Tab 400-80 MG: ORAL | 28 days supply | Qty: 12 | Fill #0 | Status: AC

## 2021-03-14 ENCOUNTER — Other Ambulatory Visit (HOSPITAL_COMMUNITY): Payer: Self-pay | Admitting: Nephrology

## 2021-03-14 ENCOUNTER — Other Ambulatory Visit (HOSPITAL_COMMUNITY): Payer: Self-pay

## 2021-03-14 MED FILL — Tacrolimus Cap 1 MG: ORAL | 30 days supply | Qty: 30 | Fill #0 | Status: AC

## 2021-03-14 MED FILL — Fenofibrate Micronized Cap 134 MG: ORAL | 30 days supply | Qty: 30 | Fill #0 | Status: AC

## 2021-03-15 ENCOUNTER — Other Ambulatory Visit (HOSPITAL_COMMUNITY): Payer: Self-pay

## 2021-03-16 ENCOUNTER — Other Ambulatory Visit (HOSPITAL_COMMUNITY): Payer: Self-pay

## 2021-03-19 DIAGNOSIS — M25571 Pain in right ankle and joints of right foot: Secondary | ICD-10-CM | POA: Diagnosis not present

## 2021-03-20 ENCOUNTER — Other Ambulatory Visit (HOSPITAL_COMMUNITY): Payer: Self-pay | Admitting: Nephrology

## 2021-03-20 MED FILL — Mycophenolate Sodium Tab DR 180 MG (Mycophenolic Acid Equiv): ORAL | 30 days supply | Qty: 240 | Fill #0 | Status: AC

## 2021-03-21 ENCOUNTER — Other Ambulatory Visit (HOSPITAL_COMMUNITY): Payer: Self-pay

## 2021-03-23 ENCOUNTER — Other Ambulatory Visit (HOSPITAL_COMMUNITY): Payer: Self-pay | Admitting: Nephrology

## 2021-03-23 ENCOUNTER — Other Ambulatory Visit (HOSPITAL_COMMUNITY): Payer: Self-pay

## 2021-03-27 ENCOUNTER — Other Ambulatory Visit (HOSPITAL_COMMUNITY): Payer: Self-pay | Admitting: Nephrology

## 2021-03-27 ENCOUNTER — Other Ambulatory Visit (HOSPITAL_COMMUNITY): Payer: Self-pay

## 2021-03-27 MED FILL — Icosapent Ethyl Cap 1 GM: ORAL | 30 days supply | Qty: 120 | Fill #1 | Status: AC

## 2021-03-27 MED FILL — Tadalafil Tab 5 MG: ORAL | 90 days supply | Qty: 90 | Fill #0 | Status: AC

## 2021-03-27 MED FILL — Metoprolol Tartrate Tab 25 MG: ORAL | 30 days supply | Qty: 30 | Fill #0 | Status: AC

## 2021-03-28 ENCOUNTER — Other Ambulatory Visit (HOSPITAL_COMMUNITY): Payer: Self-pay

## 2021-03-28 MED ORDER — PREDNISONE 5 MG PO TABS
5.0000 mg | ORAL_TABLET | Freq: Every day | ORAL | 5 refills | Status: DC
  Filled 2021-03-28: qty 30, 30d supply, fill #0
  Filled 2021-04-24: qty 30, 30d supply, fill #1
  Filled 2021-05-29: qty 30, 30d supply, fill #2
  Filled 2021-06-27: qty 30, 30d supply, fill #3
  Filled 2021-07-24: qty 30, 30d supply, fill #4

## 2021-03-28 MED ORDER — ALLOPURINOL 100 MG PO TABS
200.0000 mg | ORAL_TABLET | Freq: Every day | ORAL | 5 refills | Status: DC
  Filled 2021-03-28: qty 60, 30d supply, fill #0
  Filled 2021-04-24: qty 60, 30d supply, fill #1
  Filled 2021-05-29: qty 60, 30d supply, fill #2
  Filled 2021-06-27: qty 60, 30d supply, fill #3
  Filled 2021-08-09: qty 60, 30d supply, fill #4
  Filled 2021-09-06: qty 60, 30d supply, fill #5

## 2021-03-28 MED ORDER — ELIQUIS 5 MG PO TABS
5.0000 mg | ORAL_TABLET | Freq: Two times a day (BID) | ORAL | 5 refills | Status: DC
  Filled 2021-03-28: qty 60, 30d supply, fill #0
  Filled 2021-05-29 – 2021-08-14 (×3): qty 60, 30d supply, fill #1
  Filled 2021-09-18: qty 60, 30d supply, fill #2
  Filled 2021-10-24: qty 60, 30d supply, fill #3
  Filled 2021-11-19: qty 60, 30d supply, fill #4
  Filled 2021-12-17: qty 60, 30d supply, fill #5

## 2021-03-28 MED ORDER — SULFAMETHOXAZOLE-TRIMETHOPRIM 400-80 MG PO TABS
1.0000 | ORAL_TABLET | ORAL | 5 refills | Status: DC
  Filled 2021-03-28: qty 12, 28d supply, fill #0
  Filled 2021-04-24: qty 12, 28d supply, fill #1
  Filled 2021-05-29: qty 12, 28d supply, fill #2
  Filled 2021-06-27: qty 12, 28d supply, fill #3
  Filled 2021-07-24: qty 12, 28d supply, fill #4
  Filled 2021-08-21 – 2021-08-22 (×2): qty 12, 28d supply, fill #5

## 2021-03-28 MED ORDER — OMEPRAZOLE 20 MG PO CPDR
20.0000 mg | DELAYED_RELEASE_CAPSULE | Freq: Every day | ORAL | 5 refills | Status: DC
  Filled 2021-03-28: qty 90, 90d supply, fill #0
  Filled 2021-05-29 – 2021-06-27 (×2): qty 90, 90d supply, fill #1

## 2021-03-29 ENCOUNTER — Other Ambulatory Visit (HOSPITAL_COMMUNITY): Payer: Self-pay

## 2021-03-30 ENCOUNTER — Other Ambulatory Visit: Payer: Self-pay | Admitting: Orthopedic Surgery

## 2021-03-30 ENCOUNTER — Other Ambulatory Visit (HOSPITAL_COMMUNITY): Payer: Self-pay | Admitting: Orthopedic Surgery

## 2021-03-30 DIAGNOSIS — M25532 Pain in left wrist: Secondary | ICD-10-CM

## 2021-04-04 ENCOUNTER — Other Ambulatory Visit (HOSPITAL_COMMUNITY): Payer: Self-pay

## 2021-04-10 MED FILL — Tacrolimus Cap 1 MG: ORAL | 30 days supply | Qty: 30 | Fill #1 | Status: AC

## 2021-04-10 MED FILL — Amiodarone HCl Tab 200 MG: ORAL | 90 days supply | Qty: 90 | Fill #0 | Status: AC

## 2021-04-10 MED FILL — Mycophenolate Sodium Tab DR 180 MG (Mycophenolic Acid Equiv): ORAL | 30 days supply | Qty: 240 | Fill #1 | Status: CN

## 2021-04-11 ENCOUNTER — Other Ambulatory Visit (HOSPITAL_COMMUNITY): Payer: Self-pay

## 2021-04-12 ENCOUNTER — Other Ambulatory Visit (HOSPITAL_COMMUNITY): Payer: Self-pay

## 2021-04-13 ENCOUNTER — Other Ambulatory Visit (HOSPITAL_COMMUNITY): Payer: Self-pay

## 2021-04-13 ENCOUNTER — Other Ambulatory Visit: Payer: Self-pay | Admitting: Internal Medicine

## 2021-04-13 MED ORDER — CLONIDINE HCL 0.1 MG PO TABS
0.1000 mg | ORAL_TABLET | Freq: Every day | ORAL | 3 refills | Status: DC
  Filled 2021-04-13: qty 90, 90d supply, fill #0
  Filled 2021-05-29 – 2021-07-10 (×2): qty 90, 90d supply, fill #1
  Filled 2021-08-09 – 2021-10-08 (×2): qty 90, 90d supply, fill #2
  Filled 2022-01-12: qty 90, 90d supply, fill #3

## 2021-04-17 DIAGNOSIS — Z760 Encounter for issue of repeat prescription: Secondary | ICD-10-CM | POA: Diagnosis not present

## 2021-04-18 ENCOUNTER — Other Ambulatory Visit: Payer: Self-pay

## 2021-04-18 ENCOUNTER — Ambulatory Visit (HOSPITAL_COMMUNITY)
Admission: RE | Admit: 2021-04-18 | Discharge: 2021-04-18 | Disposition: A | Discharge status: Home / Self Care | Payer: 59 | Source: Ambulatory Visit | Attending: Orthopedic Surgery | Admitting: Orthopedic Surgery

## 2021-04-18 DIAGNOSIS — M189 Osteoarthritis of first carpometacarpal joint, unspecified: Secondary | ICD-10-CM | POA: Diagnosis not present

## 2021-04-18 DIAGNOSIS — X58XXXA Exposure to other specified factors, initial encounter: Secondary | ICD-10-CM | POA: Insufficient documentation

## 2021-04-18 DIAGNOSIS — S56512A Strain of other extensor muscle, fascia and tendon at forearm level, left arm, initial encounter: Secondary | ICD-10-CM | POA: Diagnosis not present

## 2021-04-18 DIAGNOSIS — S66812A Strain of other specified muscles, fascia and tendons at wrist and hand level, left hand, initial encounter: Secondary | ICD-10-CM | POA: Insufficient documentation

## 2021-04-18 DIAGNOSIS — M25532 Pain in left wrist: Secondary | ICD-10-CM | POA: Diagnosis present

## 2021-04-18 DIAGNOSIS — M1812 Unilateral primary osteoarthritis of first carpometacarpal joint, left hand: Secondary | ICD-10-CM | POA: Diagnosis not present

## 2021-04-18 DIAGNOSIS — I770 Arteriovenous fistula, acquired: Secondary | ICD-10-CM | POA: Diagnosis not present

## 2021-04-18 MED ORDER — SODIUM CHLORIDE (PF) 0.9 % IJ SOLN
10.0000 mL | Freq: Once | INTRAMUSCULAR | Status: AC
  Administered 2021-04-18: 10 mL

## 2021-04-18 MED ORDER — GADOBUTROL 1 MMOL/ML IV SOLN
0.0500 mL | Freq: Once | INTRAVENOUS | Status: AC | PRN
  Administered 2021-04-18: 0.05 mL

## 2021-04-18 MED ORDER — LIDOCAINE HCL (PF) 1 % IJ SOLN
5.0000 mL | Freq: Once | INTRAMUSCULAR | Status: AC
  Administered 2021-04-18: 5 mL via INTRADERMAL

## 2021-04-24 ENCOUNTER — Other Ambulatory Visit (HOSPITAL_COMMUNITY): Payer: Self-pay | Admitting: Nurse Practitioner

## 2021-04-24 MED FILL — Tacrolimus Cap 0.5 MG: ORAL | 30 days supply | Qty: 30 | Fill #0 | Status: AC

## 2021-04-24 MED FILL — Fenofibrate Micronized Cap 134 MG: ORAL | 30 days supply | Qty: 30 | Fill #1 | Status: AC

## 2021-04-24 MED FILL — Mycophenolate Sodium Tab DR 180 MG (Mycophenolic Acid Equiv): ORAL | 30 days supply | Qty: 240 | Fill #1 | Status: AC

## 2021-04-24 MED FILL — Apixaban Tab 5 MG: ORAL | 60 days supply | Qty: 120 | Fill #0 | Status: AC

## 2021-04-25 ENCOUNTER — Other Ambulatory Visit (HOSPITAL_COMMUNITY): Payer: Self-pay

## 2021-04-25 MED ORDER — METOPROLOL TARTRATE 25 MG PO TABS
12.5000 mg | ORAL_TABLET | Freq: Two times a day (BID) | ORAL | 3 refills | Status: DC
  Filled 2021-04-25: qty 30, 30d supply, fill #0
  Filled 2021-05-29: qty 30, 30d supply, fill #1

## 2021-05-02 ENCOUNTER — Other Ambulatory Visit: Payer: Self-pay | Admitting: Internal Medicine

## 2021-05-02 ENCOUNTER — Other Ambulatory Visit (HOSPITAL_COMMUNITY): Payer: Self-pay

## 2021-05-02 MED ORDER — ICOSAPENT ETHYL 1 G PO CAPS
2.0000 g | ORAL_CAPSULE | Freq: Two times a day (BID) | ORAL | 9 refills | Status: DC
  Filled 2021-05-02: qty 120, 30d supply, fill #0
  Filled 2021-05-29: qty 120, 30d supply, fill #1
  Filled 2021-06-27: qty 120, 30d supply, fill #2
  Filled 2021-07-24: qty 120, 30d supply, fill #3
  Filled 2021-08-21: qty 120, 30d supply, fill #4
  Filled 2021-09-18: qty 120, 30d supply, fill #5
  Filled 2021-11-05: qty 120, 30d supply, fill #6
  Filled 2021-12-07: qty 120, 30d supply, fill #7
  Filled 2022-01-16: qty 120, 30d supply, fill #8
  Filled 2022-02-15: qty 120, 30d supply, fill #9

## 2021-05-07 DIAGNOSIS — M25571 Pain in right ankle and joints of right foot: Secondary | ICD-10-CM | POA: Diagnosis not present

## 2021-05-08 MED FILL — Rosuvastatin Calcium Tab 20 MG: ORAL | 90 days supply | Qty: 90 | Fill #0 | Status: AC

## 2021-05-08 MED FILL — Tacrolimus Cap 1 MG: ORAL | 30 days supply | Qty: 30 | Fill #2 | Status: AC

## 2021-05-09 ENCOUNTER — Other Ambulatory Visit (HOSPITAL_COMMUNITY): Payer: Self-pay

## 2021-05-10 ENCOUNTER — Other Ambulatory Visit (HOSPITAL_COMMUNITY): Payer: Self-pay

## 2021-05-15 DIAGNOSIS — M25532 Pain in left wrist: Secondary | ICD-10-CM | POA: Diagnosis not present

## 2021-05-15 DIAGNOSIS — M778 Other enthesopathies, not elsewhere classified: Secondary | ICD-10-CM | POA: Diagnosis not present

## 2021-05-29 ENCOUNTER — Other Ambulatory Visit (HOSPITAL_COMMUNITY): Payer: Self-pay

## 2021-05-29 MED FILL — Tacrolimus Cap 0.5 MG: ORAL | 30 days supply | Qty: 30 | Fill #1 | Status: AC

## 2021-05-29 MED FILL — Tacrolimus Cap 1 MG: ORAL | 30 days supply | Qty: 30 | Fill #3 | Status: CN

## 2021-05-29 MED FILL — Mycophenolate Sodium Tab DR 180 MG (Mycophenolic Acid Equiv): ORAL | 30 days supply | Qty: 240 | Fill #2 | Status: AC

## 2021-05-30 ENCOUNTER — Other Ambulatory Visit (HOSPITAL_COMMUNITY): Payer: Self-pay

## 2021-05-30 MED ORDER — FENOFIBRATE MICRONIZED 134 MG PO CAPS
134.0000 mg | ORAL_CAPSULE | Freq: Every day | ORAL | 5 refills | Status: DC
  Filled 2021-05-30: qty 30, 30d supply, fill #0
  Filled 2021-06-27: qty 30, 30d supply, fill #1
  Filled 2021-07-24: qty 30, 30d supply, fill #2

## 2021-06-12 MED FILL — Tacrolimus Cap 1 MG: ORAL | 30 days supply | Qty: 30 | Fill #3 | Status: AC

## 2021-06-13 ENCOUNTER — Other Ambulatory Visit (HOSPITAL_COMMUNITY): Payer: Self-pay

## 2021-06-13 MED FILL — Metoprolol Tartrate Tab 50 MG: ORAL | 90 days supply | Qty: 180 | Fill #0 | Status: AC

## 2021-06-14 DIAGNOSIS — M25532 Pain in left wrist: Secondary | ICD-10-CM | POA: Diagnosis not present

## 2021-06-27 ENCOUNTER — Other Ambulatory Visit (HOSPITAL_COMMUNITY): Payer: Self-pay

## 2021-06-27 MED ORDER — MYCOPHENOLATE SODIUM 180 MG PO TBEC
720.0000 mg | DELAYED_RELEASE_TABLET | Freq: Two times a day (BID) | ORAL | 5 refills | Status: DC
  Filled 2021-06-27 (×3): qty 120, 15d supply, fill #0
  Filled 2021-07-24: qty 240, 30d supply, fill #1

## 2021-06-27 MED ORDER — TACROLIMUS 1 MG PO CAPS
1.0000 mg | ORAL_CAPSULE | Freq: Every morning | ORAL | 5 refills | Status: DC
  Filled 2021-06-27 – 2021-07-10 (×2): qty 30, 30d supply, fill #0
  Filled 2021-08-07: qty 30, 30d supply, fill #1
  Filled 2021-09-18: qty 30, 30d supply, fill #2
  Filled 2021-10-30: qty 30, 30d supply, fill #3
  Filled 2021-11-26: qty 30, 30d supply, fill #4
  Filled 2021-12-24: qty 30, 30d supply, fill #5

## 2021-06-27 MED FILL — Apixaban Tab 5 MG: ORAL | 60 days supply | Qty: 120 | Fill #1 | Status: AC

## 2021-06-27 MED FILL — Tadalafil Tab 5 MG: ORAL | 90 days supply | Qty: 90 | Fill #1 | Status: AC

## 2021-07-10 MED FILL — Amiodarone HCl Tab 200 MG: ORAL | 90 days supply | Qty: 90 | Fill #1 | Status: AC

## 2021-07-11 ENCOUNTER — Other Ambulatory Visit (HOSPITAL_COMMUNITY): Payer: Self-pay

## 2021-07-12 ENCOUNTER — Other Ambulatory Visit (HOSPITAL_COMMUNITY): Payer: Self-pay

## 2021-07-16 ENCOUNTER — Other Ambulatory Visit (HOSPITAL_COMMUNITY): Payer: Self-pay

## 2021-07-17 ENCOUNTER — Other Ambulatory Visit (HOSPITAL_COMMUNITY): Payer: Self-pay

## 2021-07-18 DIAGNOSIS — E785 Hyperlipidemia, unspecified: Secondary | ICD-10-CM | POA: Diagnosis not present

## 2021-07-18 DIAGNOSIS — I4891 Unspecified atrial fibrillation: Secondary | ICD-10-CM | POA: Diagnosis not present

## 2021-07-18 DIAGNOSIS — D751 Secondary polycythemia: Secondary | ICD-10-CM | POA: Diagnosis not present

## 2021-07-18 DIAGNOSIS — N400 Enlarged prostate without lower urinary tract symptoms: Secondary | ICD-10-CM | POA: Diagnosis not present

## 2021-07-18 DIAGNOSIS — I4892 Unspecified atrial flutter: Secondary | ICD-10-CM | POA: Diagnosis not present

## 2021-07-18 DIAGNOSIS — I129 Hypertensive chronic kidney disease with stage 1 through stage 4 chronic kidney disease, or unspecified chronic kidney disease: Secondary | ICD-10-CM | POA: Diagnosis not present

## 2021-07-18 DIAGNOSIS — Z940 Kidney transplant status: Secondary | ICD-10-CM | POA: Diagnosis not present

## 2021-07-18 DIAGNOSIS — I2510 Atherosclerotic heart disease of native coronary artery without angina pectoris: Secondary | ICD-10-CM | POA: Diagnosis not present

## 2021-07-20 ENCOUNTER — Other Ambulatory Visit (HOSPITAL_COMMUNITY): Payer: Self-pay

## 2021-07-25 ENCOUNTER — Other Ambulatory Visit (HOSPITAL_COMMUNITY): Payer: Self-pay

## 2021-07-25 MED ORDER — TACROLIMUS 0.5 MG PO CAPS
0.5000 mg | ORAL_CAPSULE | Freq: Every morning | ORAL | 5 refills | Status: DC
  Filled 2021-07-25: qty 30, 30d supply, fill #0

## 2021-07-26 DIAGNOSIS — M25532 Pain in left wrist: Secondary | ICD-10-CM | POA: Diagnosis not present

## 2021-07-26 DIAGNOSIS — M778 Other enthesopathies, not elsewhere classified: Secondary | ICD-10-CM | POA: Diagnosis not present

## 2021-08-01 ENCOUNTER — Other Ambulatory Visit (HOSPITAL_COMMUNITY): Payer: Self-pay

## 2021-08-07 ENCOUNTER — Other Ambulatory Visit (HOSPITAL_COMMUNITY): Payer: Self-pay

## 2021-08-07 ENCOUNTER — Other Ambulatory Visit: Payer: Self-pay

## 2021-08-07 ENCOUNTER — Encounter: Payer: Self-pay | Admitting: Internal Medicine

## 2021-08-07 ENCOUNTER — Ambulatory Visit (INDEPENDENT_AMBULATORY_CARE_PROVIDER_SITE_OTHER): Payer: 59 | Admitting: Internal Medicine

## 2021-08-07 VITALS — BP 114/80 | HR 52 | Ht 68.0 in | Wt 210.0 lb

## 2021-08-07 DIAGNOSIS — Z79899 Other long term (current) drug therapy: Secondary | ICD-10-CM

## 2021-08-07 DIAGNOSIS — E032 Hypothyroidism due to medicaments and other exogenous substances: Secondary | ICD-10-CM | POA: Diagnosis not present

## 2021-08-07 DIAGNOSIS — I491 Atrial premature depolarization: Secondary | ICD-10-CM

## 2021-08-07 DIAGNOSIS — R001 Bradycardia, unspecified: Secondary | ICD-10-CM

## 2021-08-07 DIAGNOSIS — I4892 Unspecified atrial flutter: Secondary | ICD-10-CM | POA: Diagnosis not present

## 2021-08-07 DIAGNOSIS — I517 Cardiomegaly: Secondary | ICD-10-CM | POA: Diagnosis not present

## 2021-08-07 MED ORDER — FUROSEMIDE 40 MG PO TABS
40.0000 mg | ORAL_TABLET | Freq: Every day | ORAL | 3 refills | Status: AC | PRN
  Filled 2021-08-07: qty 10, 10d supply, fill #0
  Filled 2021-09-18: qty 10, 10d supply, fill #1
  Filled 2022-06-18: qty 10, 10d supply, fill #2

## 2021-08-07 NOTE — Patient Instructions (Signed)
Medication Instructions:  Your physician has recommended you make the following change in your medication:   ** Begin Lasix '40mg'$  - 1 tablet by mouth as needed for swelling.  *If you need a refill on your cardiac medications before your next appointment, please call your pharmacy*   Lab Work: Liver panel and TSH today If you have labs (blood work) drawn today and your tests are completely normal, you will receive your results only by: [place] (if you have MyChart) OR A paper copy in the mail If you have any lab test that is abnormal or we need to change your treatment, we will call you to review the results.   Testing/Procedures: None ordered.    Follow-Up: At [facility], you and your health needs are our priority.  As part of our continuing mission to provide you with exceptional heart care, we have created designated Provider Care Teams.  These Care Teams include your primary Cardiologist (physician) and Advanced Practice Providers (APPs -  Physician Assistants and Nurse Practitioners) who all work together to provide you with the care you need, when you need it.  We recommend signing up for the patient portal called "MyChart".  Sign up information is provided on this After Visit Summary.  MyChart is used to connect with patients for Virtual Visits (Telemedicine).  Patients are able to view lab/test results, encounter notes, upcoming appointments, etc.  Non-urgent messages can be sent to your provider as well.   To learn more about what you can do with MyChart, go to [email or web address].    Your next appointment:   6 month(s)  The format for your next appointment:   In Person  Provider:   Virl Axe, MD

## 2021-08-07 NOTE — Progress Notes (Signed)
.    A serie  Patient Care Team: Patient, No Pcp Per (Inactive) as PCP - General (General Practice)   HPI  [name] is a 60 y.o. male Seen in followup for concerns about atrial fibrillation in the context of significant left atrial enlargement prior atrial flutter for which he underwent catheter ablation 2013 and left ventricular hypertrophy.  Presumed HCM.  Spoke with Dr. Broadus John.  Has deferred genetic testing.  Ischemic heart disease with prior bypass surgery 4/13 and had post CABG atrial fibrillation.   Atypical and typical flutter but amplitudes particularly in lead V1 which are very atypical.  He saw Dr. Greggory Brandy and underwent catheter ablation for atrial flutter as well as atrial fibrillation.  9/18.. Atrial flutter was noninducible.  Pulmonary vein isolation was accomplished.  Multiple PACs.  Further repeat ablation was not recommended   Now maintained on  Amio;as undergone interval ablation by Dr Jefm Miles.   But noted no interval benefit in his patient experience overall was really quite negative.  At our last visit 3/22 discussed alternative referrals, [place], [place], [place] [facility].  Maintained on amio  Patient denies symptoms of GI intolerance, sun sensitivity, neurological symptoms attributable to amiodarone.   The patient denies chest pain, shortness of breath, nocturnal dyspnea, orthopnea or peripheral edema.  There have been no palpitations, lightheadedness or synciope  Doing amazingly well on his amiodarone.  Lots going on in recent months.  His mother passed away.  He has had some contact with his birth mother.  A good friend committed suicide.Marland Kitchen   Has hx of remote stroke      DATE TEST EF   9/15 Echo   55-65 % LVH severe LAE (53/2.4)  7/16 cMRI   ASH Mid--apical  cavitary obliteration  No contrast 2/2 renal disease  9/18 TEE 65% HCM   1/22 Echo  70-75%    Date Cr K TSH LFTs Hgb  9/17 1.5 4.6      10/19 1.5 4.1   17  5/20 1.26 4.7   16.1  3/21 1.56 4.6 1.12    2/22 1.73  4.8 2.04 27        He has resting bradycardia  Past Medical History:  Diagnosis Date   Arteriovenous fistula, acquired ([place])    Atrial fibrillation and flutter ([place])    Bladder stones    BMI 31.0-31.9,adult    CAD (coronary artery disease)    a. s/p CABG 03/2012: LIMA to LAD, free RIMA to OM1, SVG to D1, sequential SVG to AM and LPLB2, EVH via right thigh and leg.   Cellulitis 04/13/2012   RLE saphenous vein harvest incision    CKD (chronic kidney disease)    Dyslipidemia    Gout    History of kidney transplant    Hyperglycemia    Hyperlipidemia    Hyperparathyroidism    Hypertension    Hypertrophic cardiomyopathy ([facility])    Hypomagnesemia    Immunosuppression ([facility])    Intermittent palpitations    Microscopic hematuria    Migraines    PAF and Flutter    a. Afib post-op CABG; AFlutter 10/2012   Peripheral vascular disease ([place])    Post-transplant erythrocytosis    Renal disease    S/P living-donor kidney transplantation    Sinus node dysfunction-post termination pause    a. >8sec in setting of aflutter 10/2012   Sleep pattern disturbance    Stroke [facility]) 1995; 2001   denies residual   Superficial vein thrombosis  a. R greater saphenous 04/2012   Vitamin D deficiency    VT (ventricular tachycardia) ([place])     Past Surgical History:  Procedure Laterality Date   ATRIAL FIBRILLATION ABLATION N/A 08/21/2017   Procedure: Atrial Fibrillation Ablation;  Surgeon: [name], MD;  Location: [place] CV LAB;  Service: Cardiovascular;  Laterality: N/A;   ATRIAL FLUTTER ABLATION N/A 11/09/2012   Procedure: ATRIAL FLUTTER ABLATION;  Surgeon: [name], MD;  Location: [facility] CATH LAB;  Service: Cardiovascular;  Laterality: N/A;   AV FISTULA PLACEMENT  2011   left forearm   AV FISTULA PLACEMENT     CARDIAC CATHETERIZATION  03/16/12   CARDIOVERSION N/A 06/10/2017   Procedure: CARDIOVERSION;  Surgeon: Pixie Casino, MD;  Location: [facility] ENDOSCOPY;  Service: Cardiovascular;   Laterality: N/A;   CARDIOVERSION N/A 10/13/2017   Procedure: CARDIOVERSION;  Surgeon: Dorothy Spark, MD;  Location: [facility] ENDOSCOPY;  Service: Cardiovascular;  Laterality: N/A;   CARDIOVERSION N/A 10/08/2018   Procedure: CARDIOVERSION;  Surgeon: Larey Dresser, MD;  Location: [facility] ENDOSCOPY;  Service: Cardiovascular;  Laterality: N/A;   CARDIOVERSION N/A 04/27/2019   Procedure: CARDIOVERSION;  Surgeon: Dorothy Spark, MD;  Location: [facility] ENDOSCOPY;  Service: Cardiovascular;  Laterality: N/A;   CARDIOVERSION N/A 02/24/2020   Procedure: CARDIOVERSION;  Surgeon: Donato Heinz, MD;  Location: [place];  Service: Cardiovascular;  Laterality: N/A;   CORONARY ARTERY BYPASS GRAFT  03/19/2012   Procedure: CORONARY ARTERY BYPASS GRAFTING (CABG);  Surgeon: Rexene Alberts, MD;  Location: [place];  Service: Open Heart Surgery;  Laterality: N/A;  (B) MAMMARY   CYSTOSCOPY WITH BIOPSY N/A 08/15/2016   Procedure: CYSTOSCOPY WITH IDENTIFICATION OF RIGHT TRANSPLANTATION OF URETRAL ORFICE WITH FLUORESCEIN;  Surgeon: Carolan Clines, MD;  [facility];  Service: Urology;  Laterality: N/A;   DENTAL SURGERY  2008   multiple   ELBOW FRACTURE SURGERY  1972   left   FRACTURE SURGERY     HERNIA REPAIR  [id number]   umbilical   HOLMIUM LASER APPLICATION N/A [id number]   Procedure: HOLMIUM LASER APPLICATION WITH 3 CM RIGHT BLADDER STONE;  Surgeon: Carolan Clines, MD;  [facility];  Service: Urology;  Laterality: N/A;   INGUINAL HERNIA REPAIR  09/2009   bilaterally   KIDNEY TRANSPLANT     LEFT HEART CATH AND CORS/GRAFTS ANGIOGRAPHY N/A 07/27/2019   Procedure: LEFT HEART CATH AND CORS/GRAFTS ANGIOGRAPHY;  Surgeon: Sherren Mocha, MD;  Location: [place] CV LAB;  Service: Cardiovascular;  Laterality: N/A;   LEFT HEART CATHETERIZATION WITH CORONARY ANGIOGRAM N/A 03/16/2012   Procedure: LEFT HEART CATHETERIZATION WITH CORONARY ANGIOGRAM;  Surgeon: Sherren Mocha, MD;  Location: [facility] CATH LAB;   Service: Cardiovascular;  Laterality: N/A;   RENAL ARTERY STENT  2001   TEE WITHOUT CARDIOVERSION N/A 08/21/2017   Procedure: TRANSESOPHAGEAL ECHOCARDIOGRAM (TEE);  Surgeon: Larey Dresser, MD;  Location: [facility] ENDOSCOPY;  Service: Cardiovascular;  Laterality: N/A;   TRANSURETHRAL RESECTION OF BLADDER TUMOR N/A 08/15/2016   Procedure: TRANSURETHRAL BIOPSY OF RIGHT BLADDER WALL;  Surgeon: Carolan Clines, MD;  [facility];  Service: Urology;  Laterality: N/A;     Allergies  Allergen Reactions   Iodinated Diagnostic Agents Other (See Comments) and Shortness Of Breath    Pt states that this medication causes him to code.  Respiratory arrest   Penicillins Itching, Rash and Other (See Comments)    Has patient had a PCN reaction causing immediate rash, facial/tongue/throat swelling, SOB or lightheadedness with hypotension: No  Has patient had a PCN reaction causing severe rash involving mucus membranes or skin necrosis: No Has patient had a PCN reaction that required hospitalization No Has patient had a PCN reaction occurring within the last 10 years: No If all of the above answers are "NO", then may proceed with Cephalosporin use.   Lisinopril     Increased Cr   Current Meds  Medication Sig   allopurinol (ZYLOPRIM) 100 MG tablet Take 2 tablets by mouth daily   amiodarone (PACERONE) 200 MG tablet TAKE 2 TABLETS (400 MG TOTAL) BY MOUTH DAILY FOR 28 DAYS, THEN TAKE 1 TABLET BY MOUTH DAILY. (Patient taking differently: Take 200 mg by mouth daily.)   apixaban (ELIQUIS) 5 MG TABS tablet Take 1 tablet (5 mg total) by mouth 2 (two) times daily.   cloNIDine (CATAPRES) 0.1 MG tablet Take 1 tablet (0.1 mg total) by mouth daily.   diltiazem (CARDIZEM) 60 MG tablet Take 1 tablet as needed every 6 hours for rapid afib HR over 100. (Patient taking differently: Take 60 mg by mouth every 6 (six) hours as needed (rapid afib HR over 100).)   ELIQUIS 5 MG TABS tablet TAKE 1 TABLET BY MOUTH TWICE A DAY    fenofibrate micronized (LOFIBRA) 134 MG capsule Take 134 mg by mouth daily.    fenofibrate micronized (LOFIBRA) 134 MG capsule TAKE 1 CAPSULE BY MOUTH DAILY   fluticasone (FLONASE) 50 MCG/ACT nasal spray Place 1 spray into both nostrils as needed for allergies or rhinitis.   furosemide (LASIX) 40 MG tablet Take 1 tablet (40 mg total) by mouth daily as needed.   icosapent Ethyl (VASCEPA) 1 g capsule Take 2 capsules (2 g total) by mouth 2 (two) times daily.   magnesium oxide (MAG-OX) 400 (241.3 Mg) MG tablet Take 400 mg by mouth 2 (two) times daily.   metoprolol tartrate (LOPRESSOR) 50 MG tablet Take 50 mg by mouth daily.   mycophenolate (MYFORTIC) 180 MG EC tablet Take 720 mg by mouth 2 (two) times daily.    mycophenolate (MYFORTIC) 180 MG EC tablet TAKE 4 TABLETS BY MOUTH 2 TIMES DAILY   omeprazole (PRILOSEC) 20 MG capsule TAKE 1 CAPSULE BY MOUTH DAILY   predniSONE (DELTASONE) 5 MG tablet TAKE 1 TABLET BY MOUTH DAILY   rosuvastatin (CRESTOR) 20 MG tablet TAKE 1 TABLET BY MOUTH DAILY. (Patient taking differently: Taking '20mg'$  by mouth 4 times a week)   sulfamethoxazole-trimethoprim (BACTRIM) 400-80 MG tablet Take 1 tablet by mouth 3 (three) times a week on Monday, Wednesday, and Friday   sulfamethoxazole-trimethoprim (BACTRIM,SEPTRA) 400-80 MG tablet Take 1 tablet by mouth every Monday, Wednesday, and Friday.   tacrolimus (PROGRAF) 0.5 MG capsule daily.   tacrolimus (PROGRAF) 1 MG capsule Taking 1 tablet by mouth in the am and 1/2 tablet in the pm   tacrolimus (PROGRAF) 1 MG capsule Take 1 capsule (1 mg total) by mouth in the morning.   tadalafil (CIALIS) 5 MG tablet TAKE 1 TABLET BY MOUTH ONCE DAILY.      Review of Systems negative except from HPI and PMH  BP 114/80   Pulse (!) 52   Ht '5\' 8"'$  (1.727 m)   Wt 210 lb (95.3 kg)   SpO2 97%   BMI 31.93 kg/m  Well developed and obesity  in no acute distress HENT normal Neck supple with JVP-  Flat  Clear Regular rate and rhythm, no murmurs  or gallops Abd-soft with active BS No Clubbing cyanosis edema Skin-warm and dry A &  Oriented  Grossly normal sensory and motor function   ECG sinus @ 49 17/15/55  Assessment and  Plan   Atrial Flutter  S/p RFCA-- recurrent and atypical s/p ablation 2018-JA   Freq PACs   LAE   LVH-Asymmetric 18/11  HCM    CAD s/p CABG 2013   Sleep disordered breathing   Sinus bradycardia   Status post kidney transplant   Atrial fibrillation has been largely quiescent.  We will continue amiodarone to 200 mg a day.  We discussed alternatives for ablation at [facility], [place]?  [facility].  At this point, he would like to continue on the amiodarone.  No bleeding on the Eliquis.  Continue 5 mg twice daily.  His metoprolol dose is unusual taking tartrate once a day; however, it has been well-tolerated and has obviated issues of bradycardia.  We will continue.  No angina.  Continue the metoprolol and the Eliquis.  And the rosuvastatin at 20 mg

## 2021-08-08 ENCOUNTER — Other Ambulatory Visit (HOSPITAL_COMMUNITY): Payer: Self-pay

## 2021-08-08 LAB — TSH: TSH: 1.01 u[IU]/mL (ref 0.450–4.500)

## 2021-08-08 LAB — COMPREHENSIVE METABOLIC PANEL
ALT: 25 IU/L (ref 0–44)
AST: 22 IU/L (ref 0–40)
Albumin/Globulin Ratio: 2.1 (ref 1.2–2.2)
Albumin: 4.5 g/dL (ref 3.8–4.9)
Alkaline Phosphatase: 60 IU/L (ref 44–121)
BUN/Creatinine Ratio: 16 (ref 9–20)
BUN: 24 mg/dL (ref 6–24)
Bilirubin Total: 0.4 mg/dL (ref 0.0–1.2)
CO2: 23 mmol/L (ref 20–29)
Calcium: 9.9 mg/dL (ref 8.7–10.2)
Chloride: 104 mmol/L (ref 96–106)
Creatinine, Ser: 1.48 mg/dL — ABNORMAL HIGH (ref 0.76–1.27)
Globulin, Total: 2.1 g/dL (ref 1.5–4.5)
Glucose: 88 mg/dL (ref 65–99)
Potassium: 4.4 mmol/L (ref 3.5–5.2)
Sodium: 141 mmol/L (ref 134–144)
Total Protein: 6.6 g/dL (ref 6.0–8.5)
eGFR: 55 mL/min/{1.73_m2} — ABNORMAL LOW (ref >59)

## 2021-08-09 ENCOUNTER — Other Ambulatory Visit (HOSPITAL_COMMUNITY): Payer: Self-pay

## 2021-08-10 ENCOUNTER — Other Ambulatory Visit (HOSPITAL_COMMUNITY): Payer: Self-pay

## 2021-08-14 ENCOUNTER — Other Ambulatory Visit (HOSPITAL_COMMUNITY): Payer: Self-pay

## 2021-08-16 ENCOUNTER — Other Ambulatory Visit (HOSPITAL_COMMUNITY): Payer: Self-pay

## 2021-08-21 ENCOUNTER — Other Ambulatory Visit: Payer: Self-pay | Admitting: Internal Medicine

## 2021-08-21 ENCOUNTER — Other Ambulatory Visit (HOSPITAL_COMMUNITY): Payer: Self-pay

## 2021-08-21 ENCOUNTER — Telehealth: Payer: 59 | Admitting: Nurse Practitioner

## 2021-08-21 DIAGNOSIS — H109 Unspecified conjunctivitis: Secondary | ICD-10-CM | POA: Diagnosis not present

## 2021-08-21 DIAGNOSIS — I499 Cardiac arrhythmia, unspecified: Secondary | ICD-10-CM | POA: Insufficient documentation

## 2021-08-21 MED ORDER — POLYMYXIN B-TRIMETHOPRIM 10000-0.1 UNIT/ML-% OP SOLN: 1.0000 [drp] | Freq: Four times a day (QID) | OPHTHALMIC | 0 refills | Status: AC

## 2021-08-21 NOTE — Progress Notes (Signed)
E-Visit for [employer]   We are sorry that you are not feeling well.  Here is how we plan to help!  Based on what you have shared with me it looks like you have conjunctivitis.  Conjunctivitis is a common inflammatory or infectious condition of the eye that is often referred to as "pink eye".  In most cases it is contagious (viral or bacterial). However, not all conjunctivitis requires antibiotics (ex. Allergic).  We have made appropriate suggestions for you based upon your presentation.  I have prescribed Polytrim Ophthalmic drops 1-2 drops 4 times a day times 5 days  If your eye continues to be swollen and is not improving after 24 hours using drops you may require oral antibiotics. Please schedule a follow up at that time as needed or seek immediate medical attention for any acutely worsening symptoms like change in vision  Pink eye can be highly contagious.  It is typically spread through direct contact with secretions, or contaminated objects or surfaces that one may have touched.  Strict handwashing is suggested with soap and water is urged.  If not available, use alcohol based had sanitizer.  Avoid unnecessary touching of the eye.  If you wear contact lenses, you will need to refrain from wearing them until you see no white discharge from the eye for at least 24 hours after being on medication.  You should see symptom improvement in 1-2 days after starting the medication regimen.  Call us if symptoms are not improved in 1-2 days.  Home Care: Wash your hands often! Do not wear your contacts until you complete your treatment plan. Avoid sharing towels, bed linen, personal items with a person who has pink eye. See attention for anyone in your home with similar symptoms.  Get Help Right Away If: Your symptoms do not improve. You develop blurred or loss of vision. Your symptoms worsen (increased discharge, pain or redness)   Thank you for choosing an e-visit.  Your e-visit answers were  reviewed by a board certified advanced clinical practitioner to complete your personal care plan. Depending upon the condition, your plan could have included both over the counter or prescription medications.  Please review your pharmacy choice. Make sure the pharmacy is open so you can pick up prescription now. If there is a problem, you may contact your provider through [employer] and have the prescription routed to another pharmacy.  Your safety is important to [place]. If you have drug allergies check your prescription carefully.   For the next 24 hours you can use MyChart to ask questions about today's visit, request a non-urgent call back, or ask for a work or school excuse. You will get an email in the next two days asking about your experience. I hope that your e-visit has been valuable and will speed your recovery.  I spent approximately 7 minutes reviewing the patient's history, current symptoms and coordinating their plan of care today.    Meds ordered this encounter  Medications   trimethoprim-polymyxin b (POLYTRIM) ophthalmic solution    Sig: Place 1 drop into the left eye in the morning, at noon, in the evening, and at bedtime for 5 days.    Dispense:  10 mL    Refill:  0

## 2021-08-22 ENCOUNTER — Other Ambulatory Visit (HOSPITAL_COMMUNITY): Payer: Self-pay

## 2021-08-22 ENCOUNTER — Other Ambulatory Visit: Payer: Self-pay | Admitting: Nurse Practitioner

## 2021-08-22 DIAGNOSIS — Z940 Kidney transplant status: Secondary | ICD-10-CM

## 2021-08-22 MED ORDER — FENOFIBRATE MICRONIZED 134 MG PO CAPS
134.0000 mg | ORAL_CAPSULE | Freq: Every day | ORAL | 5 refills | Status: DC
  Filled 2021-08-22: qty 30, 30d supply, fill #0
  Filled 2021-09-18: qty 30, 30d supply, fill #1
  Filled 2021-10-30: qty 30, 30d supply, fill #2
  Filled 2021-11-26: qty 30, 30d supply, fill #3
  Filled 2021-12-24: qty 30, 30d supply, fill #4
  Filled 2022-02-03: qty 30, 30d supply, fill #5

## 2021-08-22 MED ORDER — ROSUVASTATIN CALCIUM 20 MG PO TABS
20.0000 mg | ORAL_TABLET | Freq: Every day | ORAL | 3 refills | Status: DC
  Filled 2021-08-22: qty 90, 90d supply, fill #0
  Filled 2021-11-19: qty 90, 90d supply, fill #1
  Filled 2022-02-22: qty 90, 90d supply, fill #2
  Filled 2022-03-22 – 2022-05-17 (×2): qty 90, 90d supply, fill #3

## 2021-08-22 MED ORDER — OMEPRAZOLE 20 MG PO CPDR
20.0000 mg | DELAYED_RELEASE_CAPSULE | Freq: Every day | ORAL | 5 refills | Status: DC
  Filled 2021-08-22 – 2021-09-18 (×2): qty 30, 30d supply, fill #0
  Filled 2021-10-24: qty 30, 30d supply, fill #1
  Filled 2021-11-19: qty 30, 30d supply, fill #2
  Filled 2021-12-17: qty 30, 30d supply, fill #3
  Filled 2022-01-18: qty 30, 30d supply, fill #4
  Filled 2022-02-22: qty 30, 30d supply, fill #5

## 2021-08-22 MED ORDER — TACROLIMUS 0.5 MG PO CAPS
0.5000 mg | ORAL_CAPSULE | Freq: Every day | ORAL | 0 refills | Status: DC
  Filled 2021-08-22: qty 30, 30d supply, fill #0

## 2021-08-22 MED ORDER — MYCOPHENOLATE SODIUM 180 MG PO TBEC
720.0000 mg | DELAYED_RELEASE_TABLET | Freq: Two times a day (BID) | ORAL | 5 refills | Status: DC
  Filled 2021-08-22: qty 240, 30d supply, fill #0
  Filled 2021-09-18: qty 240, 30d supply, fill #1
  Filled 2021-10-24: qty 240, 30d supply, fill #2
  Filled 2021-11-19: qty 240, 30d supply, fill #3
  Filled 2021-12-17 – 2021-12-18 (×2): qty 240, 30d supply, fill #4
  Filled 2022-01-18: qty 240, 30d supply, fill #5

## 2021-08-22 MED ORDER — TACROLIMUS 0.5 MG PO CAPS
0.5000 mg | ORAL_CAPSULE | Freq: Every morning | ORAL | 5 refills | Status: DC
  Filled 2021-08-22 – 2021-09-18 (×3): qty 30, 30d supply, fill #0
  Filled 2021-10-24: qty 30, 30d supply, fill #1
  Filled 2021-11-19: qty 30, 30d supply, fill #2
  Filled 2021-12-17: qty 30, 30d supply, fill #3
  Filled 2022-01-18: qty 30, 30d supply, fill #4
  Filled 2022-02-22: qty 30, 30d supply, fill #5

## 2021-08-22 NOTE — Progress Notes (Signed)
Prograf prescription refilled, canceled during e-visit yesterday accidentally. Called patient to explain.

## 2021-08-23 ENCOUNTER — Other Ambulatory Visit (HOSPITAL_COMMUNITY): Payer: Self-pay

## 2021-08-23 ENCOUNTER — Other Ambulatory Visit: Payer: Self-pay | Admitting: Nurse Practitioner

## 2021-08-23 MED ORDER — PREDNISONE 5 MG PO TABS
5.0000 mg | ORAL_TABLET | Freq: Every day | ORAL | 5 refills | Status: DC
  Filled 2021-08-23: qty 30, 30d supply, fill #0
  Filled 2021-09-18: qty 30, 30d supply, fill #1
  Filled 2021-10-24: qty 30, 30d supply, fill #2
  Filled 2021-11-19: qty 30, 30d supply, fill #3
  Filled 2021-12-17: qty 30, 30d supply, fill #4
  Filled 2022-01-18: qty 30, 30d supply, fill #5

## 2021-08-23 NOTE — Progress Notes (Signed)
Canceled in error

## 2021-08-27 DIAGNOSIS — D2261 Melanocytic nevi of right upper limb, including shoulder: Secondary | ICD-10-CM | POA: Diagnosis not present

## 2021-08-27 DIAGNOSIS — D2262 Melanocytic nevi of left upper limb, including shoulder: Secondary | ICD-10-CM | POA: Diagnosis not present

## 2021-08-27 DIAGNOSIS — L812 Freckles: Secondary | ICD-10-CM | POA: Diagnosis not present

## 2021-08-27 DIAGNOSIS — L720 Epidermal cyst: Secondary | ICD-10-CM | POA: Diagnosis not present

## 2021-08-27 DIAGNOSIS — D1801 Hemangioma of skin and subcutaneous tissue: Secondary | ICD-10-CM | POA: Diagnosis not present

## 2021-08-27 DIAGNOSIS — D225 Melanocytic nevi of trunk: Secondary | ICD-10-CM | POA: Diagnosis not present

## 2021-08-27 DIAGNOSIS — L821 Other seborrheic keratosis: Secondary | ICD-10-CM | POA: Diagnosis not present

## 2021-08-29 ENCOUNTER — Other Ambulatory Visit (HOSPITAL_COMMUNITY): Payer: Self-pay

## 2021-08-30 DIAGNOSIS — M25532 Pain in left wrist: Secondary | ICD-10-CM | POA: Diagnosis not present

## 2021-08-30 DIAGNOSIS — M778 Other enthesopathies, not elsewhere classified: Secondary | ICD-10-CM | POA: Diagnosis not present

## 2021-09-06 ENCOUNTER — Other Ambulatory Visit (HOSPITAL_COMMUNITY): Payer: Self-pay

## 2021-09-06 DIAGNOSIS — M25532 Pain in left wrist: Secondary | ICD-10-CM | POA: Diagnosis not present

## 2021-09-14 DIAGNOSIS — M25532 Pain in left wrist: Secondary | ICD-10-CM | POA: Diagnosis not present

## 2021-09-18 MED FILL — Tadalafil Tab 5 MG: ORAL | 90 days supply | Qty: 90 | Fill #2 | Status: AC

## 2021-09-19 ENCOUNTER — Other Ambulatory Visit (HOSPITAL_COMMUNITY): Payer: Self-pay

## 2021-09-19 DIAGNOSIS — Z789 Other specified health status: Secondary | ICD-10-CM | POA: Diagnosis not present

## 2021-09-19 DIAGNOSIS — R29898 Other symptoms and signs involving the musculoskeletal system: Secondary | ICD-10-CM | POA: Diagnosis not present

## 2021-09-19 DIAGNOSIS — M25532 Pain in left wrist: Secondary | ICD-10-CM | POA: Diagnosis not present

## 2021-09-28 ENCOUNTER — Other Ambulatory Visit (HOSPITAL_COMMUNITY): Payer: Self-pay

## 2021-09-28 MED ORDER — SULFAMETHOXAZOLE-TRIMETHOPRIM 400-80 MG PO TABS
1.0000 | ORAL_TABLET | ORAL | 5 refills | Status: DC
  Filled 2021-09-28: qty 12, 28d supply, fill #0
  Filled 2021-10-12 – 2021-10-30 (×2): qty 12, 28d supply, fill #1
  Filled 2021-11-19 – 2021-11-26 (×2): qty 12, 28d supply, fill #2
  Filled 2021-12-24: qty 12, 28d supply, fill #3
  Filled 2022-01-18: qty 12, 28d supply, fill #4
  Filled 2022-02-15: qty 12, 28d supply, fill #5

## 2021-10-08 ENCOUNTER — Other Ambulatory Visit (HOSPITAL_COMMUNITY): Payer: Self-pay

## 2021-10-08 DIAGNOSIS — R29898 Other symptoms and signs involving the musculoskeletal system: Secondary | ICD-10-CM | POA: Diagnosis not present

## 2021-10-08 DIAGNOSIS — Z789 Other specified health status: Secondary | ICD-10-CM | POA: Diagnosis not present

## 2021-10-08 DIAGNOSIS — M25532 Pain in left wrist: Secondary | ICD-10-CM | POA: Diagnosis not present

## 2021-10-08 MED FILL — Amiodarone HCl Tab 200 MG: ORAL | 90 days supply | Qty: 90 | Fill #2 | Status: AC

## 2021-10-09 ENCOUNTER — Other Ambulatory Visit (HOSPITAL_COMMUNITY): Payer: Self-pay

## 2021-10-09 MED ORDER — ALLOPURINOL 100 MG PO TABS
200.0000 mg | ORAL_TABLET | Freq: Every day | ORAL | 5 refills | Status: DC
  Filled 2021-10-09: qty 60, 30d supply, fill #0
  Filled 2021-11-05: qty 60, 30d supply, fill #1
  Filled 2021-12-07: qty 60, 30d supply, fill #2
  Filled 2022-01-16: qty 60, 30d supply, fill #3
  Filled 2022-02-15: qty 60, 30d supply, fill #4
  Filled 2022-03-22: qty 60, 30d supply, fill #5

## 2021-10-12 ENCOUNTER — Other Ambulatory Visit (HOSPITAL_COMMUNITY): Payer: Self-pay

## 2021-10-15 DIAGNOSIS — R29898 Other symptoms and signs involving the musculoskeletal system: Secondary | ICD-10-CM | POA: Diagnosis not present

## 2021-10-15 DIAGNOSIS — M25532 Pain in left wrist: Secondary | ICD-10-CM | POA: Diagnosis not present

## 2021-10-15 DIAGNOSIS — Z789 Other specified health status: Secondary | ICD-10-CM | POA: Diagnosis not present

## 2021-10-22 DIAGNOSIS — M25532 Pain in left wrist: Secondary | ICD-10-CM | POA: Diagnosis not present

## 2021-10-22 DIAGNOSIS — R29898 Other symptoms and signs involving the musculoskeletal system: Secondary | ICD-10-CM | POA: Diagnosis not present

## 2021-10-24 ENCOUNTER — Other Ambulatory Visit (HOSPITAL_COMMUNITY): Payer: Self-pay

## 2021-10-29 DIAGNOSIS — R29898 Other symptoms and signs involving the musculoskeletal system: Secondary | ICD-10-CM | POA: Diagnosis not present

## 2021-10-29 DIAGNOSIS — M25532 Pain in left wrist: Secondary | ICD-10-CM | POA: Diagnosis not present

## 2021-10-29 DIAGNOSIS — Z789 Other specified health status: Secondary | ICD-10-CM | POA: Diagnosis not present

## 2021-10-30 ENCOUNTER — Other Ambulatory Visit (HOSPITAL_COMMUNITY): Payer: Self-pay

## 2021-10-31 ENCOUNTER — Other Ambulatory Visit (HOSPITAL_COMMUNITY): Payer: Self-pay

## 2021-11-06 ENCOUNTER — Other Ambulatory Visit (HOSPITAL_COMMUNITY): Payer: Self-pay

## 2021-11-06 MED ORDER — CARESTART COVID-19 HOME TEST VI KIT
PACK | 0 refills | Status: DC
  Filled 2021-11-06: qty 2, 2d supply, fill #0

## 2021-11-12 ENCOUNTER — Other Ambulatory Visit: Payer: Self-pay

## 2021-11-12 ENCOUNTER — Emergency Department (HOSPITAL_BASED_OUTPATIENT_CLINIC_OR_DEPARTMENT_OTHER)
Admission: EM | Admit: 2021-11-12 | Discharge: 2021-11-12 | Disposition: A | Discharge status: Home / Self Care | Payer: 59 | Attending: Emergency Medicine | Admitting: Emergency Medicine

## 2021-11-12 ENCOUNTER — Emergency Department (HOSPITAL_BASED_OUTPATIENT_CLINIC_OR_DEPARTMENT_OTHER): Payer: 59 | Admitting: Radiology

## 2021-11-12 ENCOUNTER — Encounter (HOSPITAL_BASED_OUTPATIENT_CLINIC_OR_DEPARTMENT_OTHER): Payer: Self-pay | Admitting: Emergency Medicine

## 2021-11-12 DIAGNOSIS — J189 Pneumonia, unspecified organism: Secondary | ICD-10-CM

## 2021-11-12 DIAGNOSIS — Z7901 Long term (current) use of anticoagulants: Secondary | ICD-10-CM | POA: Insufficient documentation

## 2021-11-12 DIAGNOSIS — Z951 Presence of aortocoronary bypass graft: Secondary | ICD-10-CM | POA: Insufficient documentation

## 2021-11-12 DIAGNOSIS — Z79899 Other long term (current) drug therapy: Secondary | ICD-10-CM | POA: Diagnosis not present

## 2021-11-12 DIAGNOSIS — I4891 Unspecified atrial fibrillation: Secondary | ICD-10-CM | POA: Diagnosis not present

## 2021-11-12 DIAGNOSIS — Z87891 Personal history of nicotine dependence: Secondary | ICD-10-CM | POA: Diagnosis not present

## 2021-11-12 DIAGNOSIS — J181 Lobar pneumonia, unspecified organism: Secondary | ICD-10-CM | POA: Insufficient documentation

## 2021-11-12 DIAGNOSIS — N183 Chronic kidney disease, stage 3 unspecified: Secondary | ICD-10-CM | POA: Diagnosis not present

## 2021-11-12 DIAGNOSIS — I129 Hypertensive chronic kidney disease with stage 1 through stage 4 chronic kidney disease, or unspecified chronic kidney disease: Secondary | ICD-10-CM | POA: Diagnosis not present

## 2021-11-12 DIAGNOSIS — R059 Cough, unspecified: Secondary | ICD-10-CM | POA: Diagnosis not present

## 2021-11-12 DIAGNOSIS — Z20822 Contact with and (suspected) exposure to covid-19: Secondary | ICD-10-CM | POA: Diagnosis not present

## 2021-11-12 DIAGNOSIS — I2510 Atherosclerotic heart disease of native coronary artery without angina pectoris: Secondary | ICD-10-CM | POA: Diagnosis not present

## 2021-11-12 DIAGNOSIS — R0602 Shortness of breath: Secondary | ICD-10-CM | POA: Diagnosis not present

## 2021-11-12 DIAGNOSIS — R509 Fever, unspecified: Secondary | ICD-10-CM | POA: Diagnosis not present

## 2021-11-12 LAB — COMPREHENSIVE METABOLIC PANEL
ALT: 15 U/L (ref 0–44)
AST: 18 U/L (ref 15–41)
Albumin: 3.9 g/dL (ref 3.5–5.0)
Alkaline Phosphatase: 46 U/L (ref 38–126)
Anion gap: 6 (ref 5–15)
BUN: 23 mg/dL — ABNORMAL HIGH (ref 6–20)
CO2: 28 mmol/L (ref 22–32)
Calcium: 10.1 mg/dL (ref 8.9–10.3)
Chloride: 103 mmol/L (ref 98–111)
Creatinine, Ser: 1.61 mg/dL — ABNORMAL HIGH (ref 0.61–1.24)
GFR, Estimated: 49 mL/min — ABNORMAL LOW (ref >60)
Glucose, Bld: 118 mg/dL — ABNORMAL HIGH (ref 70–99)
Potassium: 4.6 mmol/L (ref 3.5–5.1)
Sodium: 137 mmol/L (ref 135–145)
Total Bilirubin: 0.7 mg/dL (ref 0.3–1.2)
Total Protein: 6.6 g/dL (ref 6.5–8.1)

## 2021-11-12 LAB — CBC WITH DIFFERENTIAL/PLATELET
Abs Immature Granulocytes: 0.09 10*3/uL — ABNORMAL HIGH (ref 0.00–0.07)
Basophils Absolute: 0 10*3/uL (ref 0.0–0.1)
Basophils Relative: 0 %
Eosinophils Absolute: 0 10*3/uL (ref 0.0–0.5)
Eosinophils Relative: 0 %
HCT: 47.7 % (ref 39.0–52.0)
Hemoglobin: 15.5 g/dL (ref 13.0–17.0)
Immature Granulocytes: 1 %
Lymphocytes Relative: 10 %
Lymphs Abs: 1.1 10*3/uL (ref 0.7–4.0)
MCH: 28.9 pg (ref 26.0–34.0)
MCHC: 32.5 g/dL (ref 30.0–36.0)
MCV: 88.8 fL (ref 80.0–100.0)
Monocytes Absolute: 1 10*3/uL (ref 0.1–1.0)
Monocytes Relative: 9 %
Neutro Abs: 8.8 10*3/uL — ABNORMAL HIGH (ref 1.7–7.7)
Neutrophils Relative %: 80 %
Platelets: 186 10*3/uL (ref 150–400)
RBC: 5.37 MIL/uL (ref 4.22–5.81)
RDW: 13.9 % (ref 11.5–15.5)
WBC: 11.1 10*3/uL — ABNORMAL HIGH (ref 4.0–10.5)
nRBC: 0 % (ref 0.0–0.2)

## 2021-11-12 LAB — TROPONIN I (HIGH SENSITIVITY)
Troponin I (High Sensitivity): 79 ng/L — ABNORMAL HIGH (ref <18)
Troponin I (High Sensitivity): 82 ng/L — ABNORMAL HIGH (ref <18)

## 2021-11-12 LAB — RESP PANEL BY RT-PCR (FLU A&B, COVID) ARPGX2
Influenza A by PCR: NEGATIVE
Influenza B by PCR: NEGATIVE
SARS Coronavirus 2 by RT PCR: NEGATIVE

## 2021-11-12 MED ORDER — DOXYCYCLINE HYCLATE 100 MG PO CAPS: 100.0000 mg | ORAL_CAPSULE | Freq: Two times a day (BID) | ORAL | 0 refills | Status: AC

## 2021-11-12 MED ORDER — DICYCLOMINE HCL 10 MG PO CAPS
10.0000 mg | ORAL_CAPSULE | Freq: Once | ORAL | Status: AC
  Administered 2021-11-12: 10 mg via ORAL
  Filled 2021-11-12: qty 1

## 2021-11-12 MED ORDER — CEFDINIR 300 MG PO CAPS: 300.0000 mg | ORAL_CAPSULE | Freq: Two times a day (BID) | ORAL | 0 refills | Status: AC

## 2021-11-12 NOTE — ED Provider Notes (Signed)
[place] EMERGENCY DEPT Provider Note   CSN: [id number] Arrival date & time: 11/12/21  1108     History Chief Complaint  Patient presents with   Cough    [name] is a 60 y.o. male.  With past medical history of CAD s/p CABG in 2013x5, CKD s/p renal transplant in 2014, hyperlipidemia, hypertension, atrial fibrillation anticoagulated on Eliquis who presents to the emergency department with cough.  Patient states that 2 weeks ago he began having sore throat, fever, congestion and sneezing.  He states that the symptoms lasted for about 1 week.  He did not seek care at that time.  He states that after a week he began to feel better.  He states that last week he then started coughing and loss of sense of taste.  He states that he took a home COVID test which was negative.  States that he then improved from those symptoms until Sunday when he began having coughing, diarrhea, sore throat.  He endorses chest pain last night with "feeling like my heart is beating hard."  He denies shortness of breath, wheezing, fevers, nausea or vomiting denies abdominal pain, melena or hematochezia.Marland Kitchen  He states that he is tolerated p.o.   Cough Associated symptoms: chest pain and sore throat   Associated symptoms: no diaphoresis, no fever, no headaches, no shortness of breath and no wheezing       Past Medical History:  Diagnosis Date   Arteriovenous fistula, acquired ([place])    Atrial fibrillation and flutter (HCC)    Bladder stones    BMI 31.0-31.9,adult    CAD (coronary artery disease)    a. s/p CABG 03/2012: LIMA to LAD, free RIMA to OM1, SVG to D1, sequential SVG to AM and LPLB2, EVH via right thigh and leg.   Cellulitis 04/13/2012   RLE saphenous vein harvest incision    CKD (chronic kidney disease)    Dyslipidemia    Gout    History of kidney transplant    Hyperglycemia    Hyperlipidemia    Hyperparathyroidism    Hypertension    Hypertrophic cardiomyopathy (HCC)     Hypomagnesemia    Immunosuppression (HCC)    Intermittent palpitations    Microscopic hematuria    Migraines    PAF and Flutter    a. Afib post-op CABG; AFlutter 10/2012   Peripheral vascular disease ([place])    Post-transplant erythrocytosis    Renal disease    S/P living-donor kidney transplantation    Sinus node dysfunction-post termination pause    a. >8sec in setting of aflutter 10/2012   Sleep pattern disturbance    Stroke [facility]) 1995; 2001   denies residual   Superficial vein thrombosis    a. R greater saphenous 04/2012   Vitamin D deficiency    VT (ventricular tachycardia)     Patient Active Problem List   Diagnosis Date Noted   Cardiac arrhythmia 08/21/2021   LVH (left ventricular hypertrophy)-HCM 08/07/2021   PAC (premature atrial contraction) 01/30/2021   Sinus bradycardia 01/30/2021   Angina pectoris ([place]) 07/27/2019   Atrial fibrillation ([place]) 08/21/2017   Hypomagnesemia 06/11/2017   NSVT (nonsustained ventricular tachycardia) 06/11/2017   Atrial fibrillation with RVR ([place]) 06/09/2017   Pure hypercholesterolemia    Demand ischemia ([place])    CAD (coronary artery disease)    Atrial flutter ([place]) 03/01/2017   History of renal transplant 02/16/2017   Immunosuppressed status ([place]) 02/16/2017   Hypertensive heart disease without CHF 02/16/2017   Thrombocytopenia (  [place]) 02/16/2017   Immunosuppressive management encounter following kidney transplant 03/22/2014   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) 01/15/2012   Dyslipidemia 12/04/2011   Essential hypertension 12/04/2011    Past Surgical History:  Procedure Laterality Date   ATRIAL FIBRILLATION ABLATION N/A 08/21/2017   Procedure: Atrial Fibrillation Ablation;  Surgeon: [name], MD;  Location: [place] CV LAB;  Service: Cardiovascular;  Laterality: N/A;   ATRIAL FLUTTER ABLATION N/A 11/09/2012   Procedure: ATRIAL FLUTTER ABLATION;  Surgeon: [name], MD;  Location: [facility] CATH LAB;  Service: Cardiovascular;  Laterality: N/A;   AV FISTULA PLACEMENT  2011   left forearm   AV FISTULA PLACEMENT     CARDIAC CATHETERIZATION  03/16/12   CARDIOVERSION N/A 06/10/2017   Procedure: CARDIOVERSION;  Surgeon: Pixie Casino, MD;  Location: [facility] ENDOSCOPY;  Service: Cardiovascular;  Laterality: N/A;   CARDIOVERSION N/A 10/13/2017   Procedure: CARDIOVERSION;  Surgeon: Dorothy Spark, MD;  Location: [facility] ENDOSCOPY;  Service: Cardiovascular;  Laterality: N/A;   CARDIOVERSION N/A 10/08/2018   Procedure: CARDIOVERSION;  Surgeon: Larey Dresser, MD;  Location: [facility] ENDOSCOPY;  Service: Cardiovascular;  Laterality: N/A;   CARDIOVERSION N/A 04/27/2019   Procedure: CARDIOVERSION;  Surgeon: Dorothy Spark, MD;  Location: [facility] ENDOSCOPY;  Service: Cardiovascular;  Laterality: N/A;   CARDIOVERSION N/A 02/24/2020   Procedure: CARDIOVERSION;  Surgeon: Donato Heinz, MD;  Location: [place];  Service: Cardiovascular;  Laterality: N/A;   CORONARY ARTERY BYPASS GRAFT  03/19/2012   Procedure: CORONARY ARTERY BYPASS GRAFTING (CABG);  Surgeon: Rexene Alberts, MD;  Location: [place];  Service: Open Heart Surgery;  Laterality: N/A;  (B) MAMMARY   CYSTOSCOPY WITH BIOPSY N/A 08/15/2016   Procedure: CYSTOSCOPY WITH IDENTIFICATION OF RIGHT TRANSPLANTATION OF URETRAL ORFICE WITH FLUORESCEIN;  Surgeon: Carolan Clines, MD;  Location: [facility] ORS;  Service: Urology;  Laterality: N/A;   DENTAL SURGERY  2008   multiple   ELBOW FRACTURE SURGERY  1972    left   FRACTURE SURGERY     HERNIA REPAIR  71/2197   umbilical   HOLMIUM LASER APPLICATION N/A 5/88/3254   Procedure: HOLMIUM LASER APPLICATION WITH 3 CM RIGHT BLADDER STONE;  Surgeon: Carolan Clines, MD;  Location: [facility] ORS;  Service: Urology;  Laterality: N/A;   INGUINAL HERNIA REPAIR  09/2009   bilaterally   KIDNEY TRANSPLANT     LEFT HEART CATH AND CORS/GRAFTS ANGIOGRAPHY N/A 07/27/2019   Procedure: LEFT HEART CATH AND CORS/GRAFTS ANGIOGRAPHY;  Surgeon: Sherren Mocha, MD;  Location: [place] CV LAB;  Service: Cardiovascular;  Laterality: N/A;   LEFT HEART CATHETERIZATION WITH CORONARY ANGIOGRAM N/A 03/16/2012   Procedure: LEFT HEART CATHETERIZATION WITH CORONARY ANGIOGRAM;  Surgeon: Sherren Mocha, MD;  Location: [facility] CATH LAB;  Service: Cardiovascular;  Laterality: N/A;   RENAL ARTERY STENT  2001   TEE WITHOUT CARDIOVERSION N/A 08/21/2017   Procedure: TRANSESOPHAGEAL ECHOCARDIOGRAM (TEE);  Surgeon: Larey Dresser, MD;  Location: [facility] ENDOSCOPY;  Service: Cardiovascular;  Laterality: N/A;   TRANSURETHRAL RESECTION OF BLADDER TUMOR  N/A 08/15/2016   Procedure: TRANSURETHRAL BIOPSY OF RIGHT BLADDER WALL;  Surgeon: Carolan Clines, MD;  Location: [facility] ORS;  Service: Urology;  Laterality: N/A;       Family History  Adopted: Yes  Problem Relation Age of Onset   Hypertension Other     Social History   Tobacco Use   Smoking status: Former    Packs/day: 0.30    Years: 30.00    Pack years: 9.00    Types: Cigarettes    Quit date: 03/15/2012    Years since quitting: 9.6   Smokeless tobacco: Never  Vaping Use   Vaping Use: Never used  Substance Use Topics   Alcohol use: Yes    Comment: occ   Drug use: No    Home Medications Prior to Admission medications   Medication Sig Start Date End Date Taking? Authorizing Provider  allopurinol (ZYLOPRIM) 100 MG tablet Take 2 tablets (200 mg total) by mouth daily. 10/09/21     amiodarone (PACERONE) 200 MG tablet TAKE 2 TABLETS (400 MG  TOTAL) BY MOUTH DAILY FOR 28 DAYS, THEN TAKE 1 TABLET BY MOUTH DAILY. Patient taking differently: Take 200 mg by mouth daily. 02/02/21 02/02/22  [name], MD  apixaban (ELIQUIS) 5 MG TABS tablet Take 1 tablet (5 mg total) by mouth 2 (two) times daily. 03/28/21     cloNIDine (CATAPRES) 0.1 MG tablet Take 1 tablet (0.1 mg total) by mouth daily. 04/13/21   [name], MD  COVID-19 At Home Antigen Test [facility] COVID-19 HOME TEST) KIT Use as directed 11/06/21   Jefm Bryant, [facility]  ELIQUIS 5 MG TABS tablet TAKE 1 TABLET BY MOUTH TWICE A DAY 11/29/19   [name], MD  fenofibrate micronized (LOFIBRA) 134 MG capsule Take 134 mg by mouth daily.     [provider]  fenofibrate micronized (LOFIBRA) 134 MG capsule TAKE 1 CAPSULE BY MOUTH DAILY 05/31/20 08/07/21  Donato Heinz, MD  fenofibrate micronized (LOFIBRA) 134 MG capsule Take 1 capsule (134 mg total) by mouth daily. 08/22/21     fluticasone (FLONASE) 50 MCG/ACT nasal spray Place 1 spray into both nostrils as needed for allergies or rhinitis.    [provider]  furosemide (LASIX) 40 MG tablet Take 1 tablet (40 mg total) by mouth daily as needed. 08/07/21   [name], MD  icosapent Ethyl (VASCEPA) 1 g capsule Take 2 capsules (2 g total) by mouth 2 (two) times daily. 05/02/21   [name], MD  magnesium oxide (MAG-OX) 400 (241.3 Mg) MG tablet Take 400 mg by mouth 2 (two) times daily.    [provider]  metoprolol tartrate (LOPRESSOR) 50 MG tablet Take 50 mg by mouth daily.    [provider]  mycophenolate (MYFORTIC) 180 MG EC tablet Take 720 mg by mouth 2 (two) times daily.     [provider]  mycophenolate (MYFORTIC) 180 MG EC tablet TAKE 4 TABLETS BY MOUTH 2 TIMES DAILY 12/22/20 12/22/21  Donato Heinz, MD  mycophenolate (MYFORTIC) 180 MG EC tablet Take 4 tablets (720 mg total) by mouth 2 (two) times daily. 08/22/21     omeprazole (PRILOSEC) 20 MG capsule TAKE 1 CAPSULE BY MOUTH  DAILY 09/06/20 09/06/21  Donato Heinz, MD  omeprazole (PRILOSEC) 20 MG capsule Take 1 capsule (20 mg total) by mouth daily. 08/22/21     predniSONE (DELTASONE) 5 MG tablet Take 1 tablet (5 mg total) by mouth daily. 08/23/21   Apolonio Schneiders, [place]  rosuvastatin (CRESTOR) 20 MG tablet Take 1 tablet (20 mg total) by mouth daily. 08/22/21   [name], MD  sulfamethoxazole-trimethoprim (BACTRIM) 400-80 MG tablet Take 1 tablet by mouth 3 (three) times a week on Monday, Wednesday, and Friday 09/28/21     sulfamethoxazole-trimethoprim (BACTRIM,SEPTRA) 400-80 MG tablet Take 1 tablet by mouth every Monday, Wednesday, and Friday.    [provider]  tacrolimus (PROGRAF) 0.5 MG capsule Take 1 capsule (0.5 mg total) by mouth every morning. 08/22/21     tacrolimus (PROGRAF) 0.5 MG capsule Take 1 capsule (0.5 mg total) by mouth daily. 08/22/21   Apolonio Schneiders, FNP  tacrolimus (PROGRAF) 1 MG capsule Taking 1 tablet by mouth in the am and 1/2 tablet in the pm 09/05/20   [provider]  tacrolimus (PROGRAF) 1 MG capsule Take 1 capsule (1 mg total) by mouth in the morning. 06/27/21     tadalafil (CIALIS) 5 MG tablet TAKE 1 TABLET BY MOUTH ONCE DAILY. 12/21/20 12/31/21  Ceasar Mons, MD    Allergies    Iodinated diagnostic agents, Penicillins, and Lisinopril  Review of Systems   Review of Systems  Constitutional:  Negative for diaphoresis and fever.  HENT:  Positive for congestion and sore throat. Negative for sinus pressure and sinus pain.   Eyes:  Positive for redness.  Respiratory:  Positive for cough. Negative for shortness of breath and wheezing.   Cardiovascular:  Positive for chest pain and palpitations. Negative for leg swelling.  Gastrointestinal:  Positive for diarrhea. Negative for abdominal pain, blood in stool, nausea and vomiting.  Genitourinary:  Negative for dysuria.  Neurological:  Negative for headaches.  All other systems reviewed and are negative.  Physical  Exam Updated Vital Signs BP (!) 114/97 (BP Location: Right Arm)   Pulse (!) 59   Temp 98.5 F (36.9 C)   Resp 18   Ht _0  (1.727 m)   Wt 93 kg   SpO2 99%   BMI 31.17 kg/m   Physical Exam Vitals and nursing note reviewed.  Constitutional:      General: He is not in acute distress.    Appearance: Normal appearance. He is normal weight. He is not ill-appearing or toxic-appearing.  HENT:     Head: Normocephalic and atraumatic.     Nose: Congestion present.     Mouth/Throat:     Mouth: Mucous membranes are moist.     Pharynx: Posterior oropharyngeal erythema present.  Eyes:     General: No scleral icterus.    Extraocular Movements: Extraocular movements intact.     Pupils: Pupils are equal, round, and reactive to light.  Cardiovascular:     Rate and Rhythm: Normal rate and regular rhythm.     Pulses: Normal pulses.     Heart sounds: No murmur heard. Pulmonary:     Effort: Pulmonary effort is normal. No tachypnea or respiratory distress.     Breath sounds: Examination of the right-middle field reveals decreased breath sounds. Examination of the right-lower field reveals decreased breath sounds and rhonchi. Decreased breath sounds and rhonchi present. No wheezing.  Abdominal:     General: Bowel sounds are normal. There is no distension.     Palpations: Abdomen is soft.     Tenderness: There is no abdominal tenderness.  Musculoskeletal:        General: Normal range of motion.     Cervical back: Normal range of motion and neck supple.  Skin:    General: Skin is  warm and dry.     Capillary Refill: Capillary refill takes less than 2 seconds.     Coloration: Skin is not jaundiced.  Neurological:     General: No focal deficit present.     Mental Status: He is alert and oriented to person, place, and time. Mental status is at baseline.  Psychiatric:        Mood and Affect: Mood normal.        Behavior: Behavior normal.        Thought Content: Thought content normal.         Judgment: Judgment normal.    ED Results / Procedures / Treatments   Labs (all labs ordered are listed, but only abnormal results are displayed) Labs Reviewed  COMPREHENSIVE METABOLIC PANEL - Abnormal; Notable for the following components:      Result Value   Glucose, Bld 118 (*)    BUN 23 (*)    Creatinine, Ser 1.61 (*)    GFR, Estimated 49 (*)    All other components within normal limits  CBC WITH DIFFERENTIAL/PLATELET - Abnormal; Notable for the following components:   WBC 11.1 (*)    Neutro Abs 8.8 (*)    Abs Immature Granulocytes 0.09 (*)    All other components within normal limits  TROPONIN I (HIGH SENSITIVITY) - Abnormal; Notable for the following components:   Troponin I (High Sensitivity) 79 (*)    All other components within normal limits  TROPONIN I (HIGH SENSITIVITY) - Abnormal; Notable for the following components:   Troponin I (High Sensitivity) 82 (*)    All other components within normal limits  RESP PANEL BY RT-PCR (FLU A&B, COVID) [id number]   EKG EKG Interpretation  Date/Time:  Monday November 12 2021 12:55:34 EST Ventricular Rate:  55 PR Interval:  178 QRS Duration: 152 QT Interval:  547 QTC Calculation: 524 R Axis:   268 Text Interpretation: Sinus rhythm Atrial premature complex Confirmed by Lennice Sites ([id number]) on 11/12/2021 12:58:49 PM  Radiology DG Chest 2 View  Result Date: 11/12/2021 CLINICAL DATA:  Cough and malaise for the past 2 weeks. Intermittent fever. EXAM: CHEST - 2 VIEW COMPARISON:  Chest x-ray dated December 09, 2020. FINDINGS: Stable cardiomediastinal silhouette status post CABG. New asymmetric interstitial and mild airspace opacity in the right mid and lower lung. No pleural effusion or pneumothorax. No acute osseous abnormality. IMPRESSION: 1. New asymmetric interstitial and mild airspace opacity in the right mid and lower lung concerning for pneumonia. Electronically Signed   By: Titus Dubin M.D.   On: 11/12/2021 12:38     Procedures Procedures   Medications Ordered in ED Medications  dicyclomine (BENTYL) capsule 10 mg (10 mg Oral Given 11/12/21 1250)    ED Course  I have reviewed the triage vital signs and the nursing notes.  Pertinent labs & imaging results that were available during my care of the patient were reviewed by me and considered in my medical decision making (see chart for details).   1548: Spoke with Dr. Audie Box with [facility] who recommends regular follow-up as scheduled regarding troponin elevation. No change in EKG w/ hx of cardiomyopathy, thinks likely demand related.  MDM Rules/Calculators/A&P 60 year old male who presents to the emergency department with cough.  Acute cough most consistent with community-acquired pneumonia. From history, presentation consistent with viral upper respiratory infection that initially improved, and then had clinical decompensation with new cough and intermittent shortness of breath. COVID and flu negative Chest x-ray showing new asymmetric interstitial  and mild airspace opacity in the right middle and lower lobe concerning for pneumonia. EKG with sinus rhythm.  Initial troponin 79, second troponin 82.  Previous troponins from 4 years ago were based on troponin that is not high-sensitivity so difficult to compare.  I consulted and spoke with Dr. Davina Poke with [facility] who recommends that the patient have regular follow-up as scheduled.  He states there is no change in his EKG and likely demand related from his pneumonia.  He does not recommend further intervention at this time. Lab review: Creatinine 1.61, similar to previous Hemoglobin 11.1, likely related to pneumonia  The patient is nontoxic or septic in appearance.  His vitals have been stable.  He is appropriate for outpatient CAP treatment. Given penicillin allergy, will prescribe cefdinir and doxycycline for 10 days.  He is instructed to return should he have worsening shortness of  breath.  He verbalizes discharge instructions.  Safe for discharge. Final Clinical Impression(s) / ED Diagnoses Final diagnoses:  Community acquired pneumonia of right middle lobe of lung    Rx / DC Orders ED Discharge Orders          Ordered    cefdinir (OMNICEF) 300 MG capsule  2 times daily        11/12/21 1558    doxycycline (VIBRAMYCIN) 100 MG capsule  2 times daily        11/12/21 1558             Mickie Hillier, PA-C 11/12/21 1911    Lennice Sites, DO 11/13/21 0730

## 2021-11-12 NOTE — Discharge Instructions (Addendum)
You were seen in the Emergency Department today for cough and congestion.  While you are here you were diagnosed with pneumonia.  I prescribed 2 antibiotics for you to take over the next 10 days.  Please complete both of these antibiotics.  Please return to the emergency department if you have increasing shortness of breath, chest pain or fevers that are unresponsive to Tylenol or ibuprofen.  I spoke with Dr. Davina Poke, who is a cardiologist with Dr. Caryl Comes.  They have instructed you to follow-up with cardiology on your regular schedule.

## 2021-11-12 NOTE — ED Triage Notes (Signed)
Cough not feeling well x 2 weeks has no PCP  fever off and on kidney transplant 2014

## 2021-11-19 DIAGNOSIS — R29898 Other symptoms and signs involving the musculoskeletal system: Secondary | ICD-10-CM | POA: Diagnosis not present

## 2021-11-19 DIAGNOSIS — Z789 Other specified health status: Secondary | ICD-10-CM | POA: Diagnosis not present

## 2021-11-19 DIAGNOSIS — M25532 Pain in left wrist: Secondary | ICD-10-CM | POA: Diagnosis not present

## 2021-11-20 ENCOUNTER — Other Ambulatory Visit (HOSPITAL_COMMUNITY): Payer: Self-pay

## 2021-11-27 ENCOUNTER — Other Ambulatory Visit (HOSPITAL_COMMUNITY): Payer: Self-pay

## 2021-11-29 ENCOUNTER — Other Ambulatory Visit (HOSPITAL_COMMUNITY): Payer: Self-pay

## 2021-12-07 ENCOUNTER — Other Ambulatory Visit (HOSPITAL_COMMUNITY): Payer: Self-pay

## 2021-12-17 DIAGNOSIS — N400 Enlarged prostate without lower urinary tract symptoms: Secondary | ICD-10-CM | POA: Diagnosis not present

## 2021-12-18 ENCOUNTER — Other Ambulatory Visit (HOSPITAL_COMMUNITY): Payer: Self-pay

## 2021-12-19 ENCOUNTER — Other Ambulatory Visit (HOSPITAL_COMMUNITY): Payer: Self-pay

## 2021-12-20 ENCOUNTER — Other Ambulatory Visit: Payer: Self-pay

## 2021-12-20 ENCOUNTER — Other Ambulatory Visit (HOSPITAL_COMMUNITY): Payer: Self-pay

## 2021-12-20 ENCOUNTER — Other Ambulatory Visit (HOSPITAL_COMMUNITY): Payer: Self-pay | Admitting: Nephrology

## 2021-12-20 ENCOUNTER — Ambulatory Visit (HOSPITAL_COMMUNITY)
Admission: RE | Admit: 2021-12-20 | Discharge: 2021-12-20 | Disposition: A | Discharge status: Home / Self Care | Payer: 59 | Source: Ambulatory Visit | Attending: Nephrology | Admitting: Nephrology

## 2021-12-20 DIAGNOSIS — I517 Cardiomegaly: Secondary | ICD-10-CM | POA: Diagnosis not present

## 2021-12-20 DIAGNOSIS — Z940 Kidney transplant status: Secondary | ICD-10-CM | POA: Diagnosis not present

## 2021-12-20 DIAGNOSIS — I2510 Atherosclerotic heart disease of native coronary artery without angina pectoris: Secondary | ICD-10-CM | POA: Diagnosis not present

## 2021-12-20 DIAGNOSIS — N400 Enlarged prostate without lower urinary tract symptoms: Secondary | ICD-10-CM | POA: Diagnosis not present

## 2021-12-20 DIAGNOSIS — R059 Cough, unspecified: Secondary | ICD-10-CM | POA: Diagnosis not present

## 2021-12-20 DIAGNOSIS — D751 Secondary polycythemia: Secondary | ICD-10-CM | POA: Diagnosis not present

## 2021-12-20 DIAGNOSIS — E785 Hyperlipidemia, unspecified: Secondary | ICD-10-CM | POA: Diagnosis not present

## 2021-12-20 DIAGNOSIS — N5201 Erectile dysfunction due to arterial insufficiency: Secondary | ICD-10-CM | POA: Diagnosis not present

## 2021-12-20 DIAGNOSIS — I4891 Unspecified atrial fibrillation: Secondary | ICD-10-CM | POA: Diagnosis not present

## 2021-12-20 DIAGNOSIS — I4892 Unspecified atrial flutter: Secondary | ICD-10-CM | POA: Diagnosis not present

## 2021-12-20 DIAGNOSIS — R3129 Other microscopic hematuria: Secondary | ICD-10-CM | POA: Diagnosis not present

## 2021-12-20 DIAGNOSIS — I129 Hypertensive chronic kidney disease with stage 1 through stage 4 chronic kidney disease, or unspecified chronic kidney disease: Secondary | ICD-10-CM | POA: Diagnosis not present

## 2021-12-20 MED ORDER — COLCHICINE 0.6 MG PO TABS
0.6000 mg | ORAL_TABLET | Freq: Every day | ORAL | 3 refills | Status: AC | PRN
  Filled 2021-12-20: qty 30, 30d supply, fill #0

## 2021-12-20 MED ORDER — TADALAFIL 5 MG PO TABS
5.0000 mg | ORAL_TABLET | Freq: Every day | ORAL | 3 refills | Status: DC
  Filled 2021-12-20: qty 90, 90d supply, fill #0
  Filled 2022-03-22: qty 90, 90d supply, fill #1
  Filled 2022-06-18: qty 90, 90d supply, fill #2
  Filled 2022-09-24: qty 90, 90d supply, fill #3

## 2021-12-25 ENCOUNTER — Other Ambulatory Visit (HOSPITAL_COMMUNITY): Payer: Self-pay

## 2021-12-26 ENCOUNTER — Other Ambulatory Visit (HOSPITAL_COMMUNITY): Payer: Self-pay

## 2021-12-27 ENCOUNTER — Other Ambulatory Visit (HOSPITAL_COMMUNITY): Payer: Self-pay

## 2021-12-28 ENCOUNTER — Other Ambulatory Visit (HOSPITAL_COMMUNITY): Payer: Self-pay

## 2021-12-31 ENCOUNTER — Other Ambulatory Visit (HOSPITAL_COMMUNITY): Payer: Self-pay

## 2022-01-08 ENCOUNTER — Other Ambulatory Visit (HOSPITAL_COMMUNITY): Payer: Self-pay

## 2022-01-09 ENCOUNTER — Other Ambulatory Visit (HOSPITAL_COMMUNITY): Payer: Self-pay

## 2022-01-10 ENCOUNTER — Other Ambulatory Visit (HOSPITAL_COMMUNITY): Payer: Self-pay

## 2022-01-10 ENCOUNTER — Telehealth: Payer: 59 | Admitting: Physician Assistant

## 2022-01-10 DIAGNOSIS — U071 COVID-19: Secondary | ICD-10-CM | POA: Diagnosis not present

## 2022-01-10 MED ORDER — MOLNUPIRAVIR EUA 200MG CAPSULE
4.0000 | ORAL_CAPSULE | Freq: Two times a day (BID) | ORAL | 0 refills | Status: AC
  Filled 2022-01-10: qty 40, 5d supply, fill #0

## 2022-01-10 NOTE — Patient Instructions (Signed)
Anette Riedel Galdamez, thank you for joining Mar Daring, PA-C for today's virtual visit.  While this provider is not your primary care provider (PCP), if your PCP is located in our provider database this encounter information will be shared with them immediately following your visit.  Consent: (Patient) [name] provided verbal consent for this virtual visit at the beginning of the encounter.  Current Medications:  Current Outpatient Medications:    molnupiravir EUA (LAGEVRIO) 200 mg CAPS capsule, Take 4 capsules (800 mg total) by mouth 2 (two) times daily for 5 days., Disp: 40 capsule, Rfl: 0   allopurinol (ZYLOPRIM) 100 MG tablet, Take 2 tablets (200 mg total) by mouth daily., Disp: 60 tablet, Rfl: 5   amiodarone (PACERONE) 200 MG tablet, TAKE 2 TABLETS (400 MG TOTAL) BY MOUTH DAILY FOR 28 DAYS, THEN TAKE 1 TABLET BY MOUTH DAILY. (Patient taking differently: Take 200 mg by mouth daily.), Disp: 90 tablet, Rfl: 3   apixaban (ELIQUIS) 5 MG TABS tablet, Take 1 tablet (5 mg total) by mouth 2 (two) times daily., Disp: 60 tablet, Rfl: 5   cloNIDine (CATAPRES) 0.1 MG tablet, Take 1 tablet (0.1 mg total) by mouth daily., Disp: 90 tablet, Rfl: 3   colchicine 0.6 MG tablet, Take 1 tablet (0.6 mg total) by mouth daily as needed. Max 1 tablet daily, Disp: 30 tablet, Rfl: 3   COVID-19 At Home Antigen Test (CARESTART COVID-19 HOME TEST) KIT, Use as directed, Disp: 2 each, Rfl: 0   ELIQUIS 5 MG TABS tablet, TAKE 1 TABLET BY MOUTH TWICE A DAY, Disp: 60 tablet, Rfl: 5   fenofibrate micronized (LOFIBRA) 134 MG capsule, Take 134 mg by mouth daily. , Disp: , Rfl:    fenofibrate micronized (LOFIBRA) 134 MG capsule, TAKE 1 CAPSULE BY MOUTH DAILY, Disp: 30 capsule, Rfl: 5   fenofibrate micronized (LOFIBRA) 134 MG capsule, Take 1 capsule (134 mg total) by mouth daily., Disp: 30 capsule, Rfl: 5   fluticasone (FLONASE) 50 MCG/ACT nasal spray, Place 1 spray into both nostrils as needed for allergies or rhinitis.,  Disp: , Rfl:    furosemide (LASIX) 40 MG tablet, Take 1 tablet (40 mg total) by mouth daily as needed., Disp: 10 tablet, Rfl: 3   icosapent Ethyl (VASCEPA) 1 g capsule, Take 2 capsules (2 g total) by mouth 2 (two) times daily., Disp: 120 capsule, Rfl: 9   magnesium oxide (MAG-OX) 400 (241.3 Mg) MG tablet, Take 400 mg by mouth 2 (two) times daily., Disp: , Rfl:    metoprolol tartrate (LOPRESSOR) 50 MG tablet, Take 50 mg by mouth daily., Disp: , Rfl:    mycophenolate (MYFORTIC) 180 MG EC tablet, Take 720 mg by mouth 2 (two) times daily. , Disp: , Rfl:    mycophenolate (MYFORTIC) 180 MG EC tablet, Take 4 tablets (720 mg total) by mouth 2 (two) times daily., Disp: 240 tablet, Rfl: 5   omeprazole (PRILOSEC) 20 MG capsule, TAKE 1 CAPSULE BY MOUTH DAILY, Disp: 30 capsule, Rfl: 5   omeprazole (PRILOSEC) 20 MG capsule, Take 1 capsule (20 mg total) by mouth daily., Disp: 30 capsule, Rfl: 5   predniSONE (DELTASONE) 5 MG tablet, Take 1 tablet (5 mg total) by mouth daily., Disp: 30 tablet, Rfl: 5   rosuvastatin (CRESTOR) 20 MG tablet, Take 1 tablet (20 mg total) by mouth daily., Disp: 90 tablet, Rfl: 3   sulfamethoxazole-trimethoprim (BACTRIM) 400-80 MG tablet, Take 1 tablet by mouth 3 (three) times a week on Monday, Wednesday, and Friday,  Disp: 12 tablet, Rfl: 5   sulfamethoxazole-trimethoprim (BACTRIM,SEPTRA) 400-80 MG tablet, Take 1 tablet by mouth every Monday, Wednesday, and Friday., Disp: , Rfl: 6   tacrolimus (PROGRAF) 0.5 MG capsule, Take 1 capsule (0.5 mg total) by mouth every morning., Disp: 30 capsule, Rfl: 5   tacrolimus (PROGRAF) 0.5 MG capsule, Take 1 capsule (0.5 mg total) by mouth daily., Disp: 30 capsule, Rfl: 0   tacrolimus (PROGRAF) 1 MG capsule, Taking 1 tablet by mouth in the am and 1/2 tablet in the pm, Disp: , Rfl:    tacrolimus (PROGRAF) 1 MG capsule, Take 1 capsule (1 mg total) by mouth in the morning., Disp: 30 capsule, Rfl: 5   tadalafil (CIALIS) 5 MG tablet, TAKE 1 TABLET BY MOUTH  ONCE DAILY., Disp: 90 tablet, Rfl: 3   tadalafil (CIALIS) 5 MG tablet, Take 1 tablet (5 mg total) by mouth daily., Disp: 90 tablet, Rfl: 3   Medications ordered in this encounter:  Meds ordered this encounter  Medications   molnupiravir EUA (LAGEVRIO) 200 mg CAPS capsule    Sig: Take 4 capsules (800 mg total) by mouth 2 (two) times daily for 5 days.    Dispense:  40 capsule    Refill:  0    Order Specific Question:   Supervising Provider    Answer:   Sabra Heck, [place]     *If you need refills on other medications prior to your next appointment, please contact your pharmacy*  Follow-Up: Call back or seek an in-person evaluation if the symptoms worsen or if the condition fails to improve as anticipated.  Other Instructions Molnupiravir Oral Capsules What is this medication? MOLNUPIRAVIR (mol nue pir a vir) treats COVID-19. It is an antiviral medication. It may decrease the risk of developing severe symptoms of COVID-19. It may also decrease the chance of going to the hospital. This medication is not approved by the FDA. The FDA has authorized emergency use of this medication during the COVID-19 pandemic. This medicine may be used for other purposes; ask your health care provider or pharmacist if you have questions. COMMON BRAND NAME(S): LAGEVRIO What should I tell my care team before I take this medication? They need to know if you have any of these conditions: Any allergies Any serious illness An unusual or allergic reaction to molnupiravir, other medications, foods, dyes, or preservatives Pregnant or trying to get pregnant Breast-feeding How should I use this medication? Take this medication by mouth with water. Take it as directed on the prescription label at the same time every day. Do not cut, crush or chew this medication. Swallow the capsules whole. You can take it with or without food. If it upsets your stomach, take it with food. Take all of this medication unless your care  team tells you to stop it early. Keep taking it even if you think you are better. Talk to your care team about the use of this medication in children. Special care may be needed. Overdosage: If you think you have taken too much of this medicine contact a poison control center or emergency room at once. NOTE: This medicine is only for you. Do not share this medicine with others. What if I miss a dose? If you miss a dose, take it as soon as you can unless it is more than 10 hours late. If it is more than 10 hours late, skip the missed dose. Take the next dose at the normal time. Do not take extra or 2 doses  at the same time to make up for the missed dose. What may interact with this medication? Interactions have not been studied. This list may not describe all possible interactions. Give your health care provider a list of all the medicines, herbs, non-prescription drugs, or dietary supplements you use. Also tell them if you smoke, drink alcohol, or use illegal drugs. Some items may interact with your medicine. What should I watch for while using this medication? Your condition will be monitored carefully while you are receiving this medication. Visit your care team for regular checkups. Tell your care team if your symptoms do not start to get better or if they get worse. Do not become pregnant while taking this medication. You may need a pregnancy test before starting this medication. Women must use a reliable form of birth control while taking this medication and for 4 days after stopping the medication. Women should inform their care team if they wish to become pregnant or think they might be pregnant. Men should not father a child while taking this medication and for 3 months after stopping it. There is potential for serious harm to an unborn child. Talk to your care team for more information. Do not breast-feed an infant while taking this medication and for 4 days after stopping the medication. What  side effects may I notice from receiving this medication? Side effects that you should report to your care team as soon as possible: Allergic reactions--skin rash, itching, hives, swelling of the face, lips, tongue, or throat Side effects that usually do not require medical attention (report these to your care team if they continue or are bothersome): Diarrhea Dizziness Nausea This list may not describe all possible side effects. Call your doctor for medical advice about side effects. You may report side effects to FDA at 1-800-FDA-1088. Where should I keep my medication? Keep out of the reach of children and pets. Store at room temperature between 20 and 25 degrees C (68 and 77 degrees F). Get rid of any unused medication after the expiration date. To get rid of medications that are no longer needed or have expired: Take the medication to a medication take-back program. Check with your pharmacy or law enforcement to find a location. If you cannot return the medication, check the label or package insert to see if the medication should be thrown out in the garbage or flushed down the toilet. If you are not sure, ask your care team. If it is safe to put it in the trash, take the medication out of the container. Mix the medication with cat litter, dirt, coffee grounds, or other unwanted substance. Seal the mixture in a bag or container. Put it in the trash. NOTE: This sheet is a summary. It may not cover all possible information. If you have questions about this medicine, talk to your doctor, pharmacist, or health care provider.  2022 Elsevier/Gold Standard (2020-11-27 00:00:00)    If you have been instructed to have an in-person evaluation today at a local Urgent Care facility, please use the link below. It will take you to a list of all of our available [facility], including address, phone number and hours of operation. Please do not delay care.  [facility]  If you  or a family member do not have a primary care provider, use the link below to schedule a visit and establish care. When you choose a  primary care physician or advanced practice provider, you gain  a long-term partner in health. Find a Primary Care Provider  Learn more about [facility]'s in-office and virtual care options: [facility]

## 2022-01-10 NOTE — Progress Notes (Signed)
Virtual Visit Consent   [name], you are scheduled for a virtual visit with a [facility] provider today.     Just as with appointments in the office, your consent must be obtained to participate.  Your consent will be active for this visit and any virtual visit you may have with one of our providers in the next 365 days.     If you have a MyChart account, a copy of this consent can be sent to you electronically.  All virtual visits are billed to your insurance company just like a traditional visit in the office.    As this is a virtual visit, video technology does not allow for your provider to perform a traditional examination.  This may limit your provider's ability to fully assess your condition.  If your provider identifies any concerns that need to be evaluated in person or the need to arrange testing (such as labs, EKG, etc.), we will make arrangements to do so.     Although advances in technology are sophisticated, we cannot ensure that it will always work on either your end or our end.  If the connection with a video visit is poor, the visit may have to be switched to a telephone visit.  With either a video or telephone visit, we are not always able to ensure that we have a secure connection.     I need to obtain your verbal consent now.   Are you willing to proceed with your visit today?    [name] has provided verbal consent on 01/10/2022 for a virtual visit (video or telephone).   Mar Daring, PA-C   Date: 01/10/2022 1:36 PM   Virtual Visit via Video Note   I, Mar Daring, connected with  [name]  ([id number], 07-21-1961) on 01/10/22 at  1:30 PM EST by a video-enabled telemedicine application and verified that I am speaking with the correct person using two identifiers.  Location: Patient: Virtual Visit Location Patient: Home Provider: Virtual Visit Location Provider: Home Office   I discussed the limitations of evaluation and management by  telemedicine and the availability of in person appointments. The patient expressed understanding and agreed to proceed.    History of Present Illness: [name] is a 61 y.o. who identifies as a male who was assigned male at birth, and is being seen today for covid 51.  HPI: URI  This is a new problem. Episode onset: tested positive for covid 19 today; symptoms started yesterday. The problem has been unchanged. There has been no fever (99.5). Associated symptoms include congestion, coughing (productive), headaches, rhinorrhea, sinus pain (mild behind eyes) and a sore throat (mild; from coughing). Pertinent negatives include no diarrhea, ear pain, nausea, plugged ear sensation or vomiting. Associated symptoms comments: Post nasal drainage, taste is altered, fatigue, mild aching. Treatments tried: tylenol. The treatment provided no relief.     Problems:  Patient Active Problem List   Diagnosis Date Noted   Cardiac arrhythmia 08/21/2021   LVH (left ventricular hypertrophy)-HCM 08/07/2021   PAC (premature atrial contraction) 01/30/2021   Sinus bradycardia 01/30/2021   Angina pectoris ([place]) 07/27/2019   Atrial fibrillation ([place]) 08/21/2017   Hypomagnesemia 06/11/2017   NSVT (nonsustained ventricular tachycardia) 06/11/2017   Atrial fibrillation with RVR ([place]) 06/09/2017   Pure hypercholesterolemia    Demand ischemia ([place])    CAD (coronary artery disease)    Atrial flutter ([place]) 03/01/2017   History of renal transplant 02/16/2017  Immunosuppressed status ([place]) 02/16/2017   Hypertensive heart disease without CHF 02/16/2017   Thrombocytopenia ([place]) 02/16/2017   Immunosuppressive management encounter following kidney transplant 03/22/2014   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   Obesity (BMI 30.0-34.9) 08/12/2013   Hypophosphatemia 06/08/2013   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   Elevated serum creatinine 05/14/2013   Swelling of joint, elbow, right 05/04/2013   Abnormal  ultrasound of kidney 04/27/2013   Immunodeficiency, unspecified ([place]) 03/30/2013   Long term (current) use of anticoagulants 11/02/2012   Stroke ([place])    S/P CABG (coronary artery bypass graft) 05/12/2012   Smoking 01/15/2012   CKD (chronic kidney disease), stage III ([place]) 01/15/2012   Dyslipidemia 12/04/2011   Essential hypertension 12/04/2011    Allergies:  Allergies  Allergen Reactions   Iodinated Contrast Media Other (See Comments) and Shortness Of Breath    Pt states that this medication causes him to code.  Respiratory arrest   Penicillins Itching, Rash and Other (See Comments)    Has patient had a PCN reaction causing immediate rash, facial/tongue/throat swelling, SOB or lightheadedness with hypotension: No Has patient had a PCN reaction causing severe rash involving mucus membranes or skin necrosis: No Has patient had a PCN reaction that required hospitalization No Has patient had a PCN reaction occurring within the last 10 years: No If all of the above answers are "NO", then may proceed with Cephalosporin use.   Lisinopril     Increased Cr   Medications:  Current Outpatient Medications:    molnupiravir EUA (LAGEVRIO) 200 mg CAPS capsule, Take 4 capsules (800 mg total) by mouth 2 (two) times daily for 5 days., Disp: 40 capsule, Rfl: 0   allopurinol (ZYLOPRIM) 100 MG tablet, Take 2 tablets (200 mg total) by mouth daily., Disp: 60 tablet, Rfl: 5   amiodarone (PACERONE) 200 MG tablet, TAKE 2 TABLETS (400 MG TOTAL) BY MOUTH DAILY FOR 28 DAYS, THEN TAKE 1 TABLET BY MOUTH DAILY. (Patient taking differently: Take 200 mg by mouth daily.), Disp: 90 tablet, Rfl: 3   apixaban (ELIQUIS) 5 MG TABS tablet, Take 1 tablet (5 mg total) by mouth 2 (two) times daily., Disp: 60 tablet, Rfl: 5   cloNIDine (CATAPRES) 0.1 MG tablet, Take 1 tablet (0.1 mg total) by mouth daily., Disp: 90 tablet, Rfl: 3   colchicine 0.6 MG tablet, Take 1 tablet (0.6 mg total) by mouth daily as needed. Max 1 tablet  daily, Disp: 30 tablet, Rfl: 3   COVID-19 At Home Antigen Test (CARESTART COVID-19 HOME TEST) KIT, Use as directed, Disp: 2 each, Rfl: 0   ELIQUIS 5 MG TABS tablet, TAKE 1 TABLET BY MOUTH TWICE A DAY, Disp: 60 tablet, Rfl: 5   fenofibrate micronized (LOFIBRA) 134 MG capsule, Take 134 mg by mouth daily. , Disp: , Rfl:    fenofibrate micronized (LOFIBRA) 134 MG capsule, TAKE 1 CAPSULE BY MOUTH DAILY, Disp: 30 capsule, Rfl: 5   fenofibrate micronized (LOFIBRA) 134 MG capsule, Take 1 capsule (134 mg total) by mouth daily., Disp: 30 capsule, Rfl: 5   fluticasone (FLONASE) 50 MCG/ACT nasal spray, Place 1 spray into both nostrils as needed for allergies or rhinitis., Disp: , Rfl:    furosemide (LASIX) 40 MG tablet, Take 1 tablet (40 mg total) by mouth daily as needed., Disp: 10 tablet, Rfl: 3   icosapent Ethyl (VASCEPA) 1 g capsule, Take 2 capsules (2 g total) by mouth 2 (two) times daily., Disp: 120 capsule, Rfl: 9   magnesium oxide (MAG-OX) 400 (  241.3 Mg) MG tablet, Take 400 mg by mouth 2 (two) times daily., Disp: , Rfl:    metoprolol tartrate (LOPRESSOR) 50 MG tablet, Take 50 mg by mouth daily., Disp: , Rfl:    mycophenolate (MYFORTIC) 180 MG EC tablet, Take 720 mg by mouth 2 (two) times daily. , Disp: , Rfl:    mycophenolate (MYFORTIC) 180 MG EC tablet, Take 4 tablets (720 mg total) by mouth 2 (two) times daily., Disp: 240 tablet, Rfl: 5   omeprazole (PRILOSEC) 20 MG capsule, TAKE 1 CAPSULE BY MOUTH DAILY, Disp: 30 capsule, Rfl: 5   omeprazole (PRILOSEC) 20 MG capsule, Take 1 capsule (20 mg total) by mouth daily., Disp: 30 capsule, Rfl: 5   predniSONE (DELTASONE) 5 MG tablet, Take 1 tablet (5 mg total) by mouth daily., Disp: 30 tablet, Rfl: 5   rosuvastatin (CRESTOR) 20 MG tablet, Take 1 tablet (20 mg total) by mouth daily., Disp: 90 tablet, Rfl: 3   sulfamethoxazole-trimethoprim (BACTRIM) 400-80 MG tablet, Take 1 tablet by mouth 3 (three) times a week on Monday, Wednesday, and Friday, Disp: 12 tablet,  Rfl: 5   sulfamethoxazole-trimethoprim (BACTRIM,SEPTRA) 400-80 MG tablet, Take 1 tablet by mouth every Monday, Wednesday, and Friday., Disp: , Rfl: 6   tacrolimus (PROGRAF) 0.5 MG capsule, Take 1 capsule (0.5 mg total) by mouth every morning., Disp: 30 capsule, Rfl: 5   tacrolimus (PROGRAF) 0.5 MG capsule, Take 1 capsule (0.5 mg total) by mouth daily., Disp: 30 capsule, Rfl: 0   tacrolimus (PROGRAF) 1 MG capsule, Taking 1 tablet by mouth in the am and 1/2 tablet in the pm, Disp: , Rfl:    tacrolimus (PROGRAF) 1 MG capsule, Take 1 capsule (1 mg total) by mouth in the morning., Disp: 30 capsule, Rfl: 5   tadalafil (CIALIS) 5 MG tablet, TAKE 1 TABLET BY MOUTH ONCE DAILY., Disp: 90 tablet, Rfl: 3   tadalafil (CIALIS) 5 MG tablet, Take 1 tablet (5 mg total) by mouth daily., Disp: 90 tablet, Rfl: 3  Observations/Objective: Patient is well-developed, well-nourished in no acute distress.  Resting comfortably at home.  Head is normocephalic, atraumatic.  No labored breathing.  Speech is clear and coherent with logical content.  Patient is alert and oriented at baseline.    Assessment and Plan: 1. COVID-19 - molnupiravir EUA (LAGEVRIO) 200 mg CAPS capsule; Take 4 capsules (800 mg total) by mouth 2 (two) times daily for 5 days.  Dispense: 40 capsule; Refill: 0  - Continue OTC symptomatic management of choice - Will send OTC vitamins and supplement information through AVS - Molnupiravir prescribed - Patient enrolled in MyChart symptom monitoring - Push fluids - Rest as needed - Discussed return precautions and when to seek in-person evaluation, sent via AVS as well  Follow Up Instructions: I discussed the assessment and treatment plan with the patient. The patient was provided an opportunity to ask questions and all were answered. The patient agreed with the plan and demonstrated an understanding of the instructions.  A copy of instructions were sent to the patient via MyChart unless otherwise  noted below.   The patient was advised to call back or seek an in-person evaluation if the symptoms worsen or if the condition fails to improve as anticipated.  Time:  I spent 10 minutes with the patient via telehealth technology discussing the above problems/concerns.    Mar Daring, PA-C

## 2022-01-12 ENCOUNTER — Other Ambulatory Visit: Payer: Self-pay | Admitting: Internal Medicine

## 2022-01-14 ENCOUNTER — Other Ambulatory Visit (HOSPITAL_COMMUNITY): Payer: Self-pay

## 2022-01-14 MED ORDER — AMIODARONE HCL 200 MG PO TABS
200.0000 mg | ORAL_TABLET | Freq: Every day | ORAL | 2 refills | Status: DC
  Filled 2022-01-14: qty 90, 90d supply, fill #0
  Filled 2022-04-12: qty 90, 90d supply, fill #1
  Filled 2022-07-15: qty 90, 90d supply, fill #2

## 2022-01-15 ENCOUNTER — Other Ambulatory Visit (HOSPITAL_COMMUNITY): Payer: Self-pay

## 2022-01-16 ENCOUNTER — Other Ambulatory Visit (HOSPITAL_COMMUNITY): Payer: Self-pay

## 2022-01-17 ENCOUNTER — Other Ambulatory Visit (HOSPITAL_COMMUNITY): Payer: Self-pay

## 2022-01-18 ENCOUNTER — Other Ambulatory Visit (HOSPITAL_COMMUNITY): Payer: Self-pay

## 2022-01-21 ENCOUNTER — Other Ambulatory Visit (HOSPITAL_COMMUNITY): Payer: Self-pay

## 2022-01-21 MED ORDER — APIXABAN 5 MG PO TABS
5.0000 mg | ORAL_TABLET | Freq: Two times a day (BID) | ORAL | 5 refills | Status: DC
  Filled 2022-01-21: qty 60, 30d supply, fill #0
  Filled 2022-02-22: qty 60, 30d supply, fill #1
  Filled 2022-03-22: qty 60, 30d supply, fill #2
  Filled 2022-04-20: qty 60, 30d supply, fill #3
  Filled 2022-05-17: qty 60, 30d supply, fill #4
  Filled 2022-06-18: qty 60, 30d supply, fill #5

## 2022-01-30 IMAGING — DX DG CHEST 2V
2 series · 2 of 2 positions shown · non-contrast
Comparison: 11/12/2021

CLINICAL DATA: Cough

EXAM:
CHEST - 2 VIEW

[chest pa]
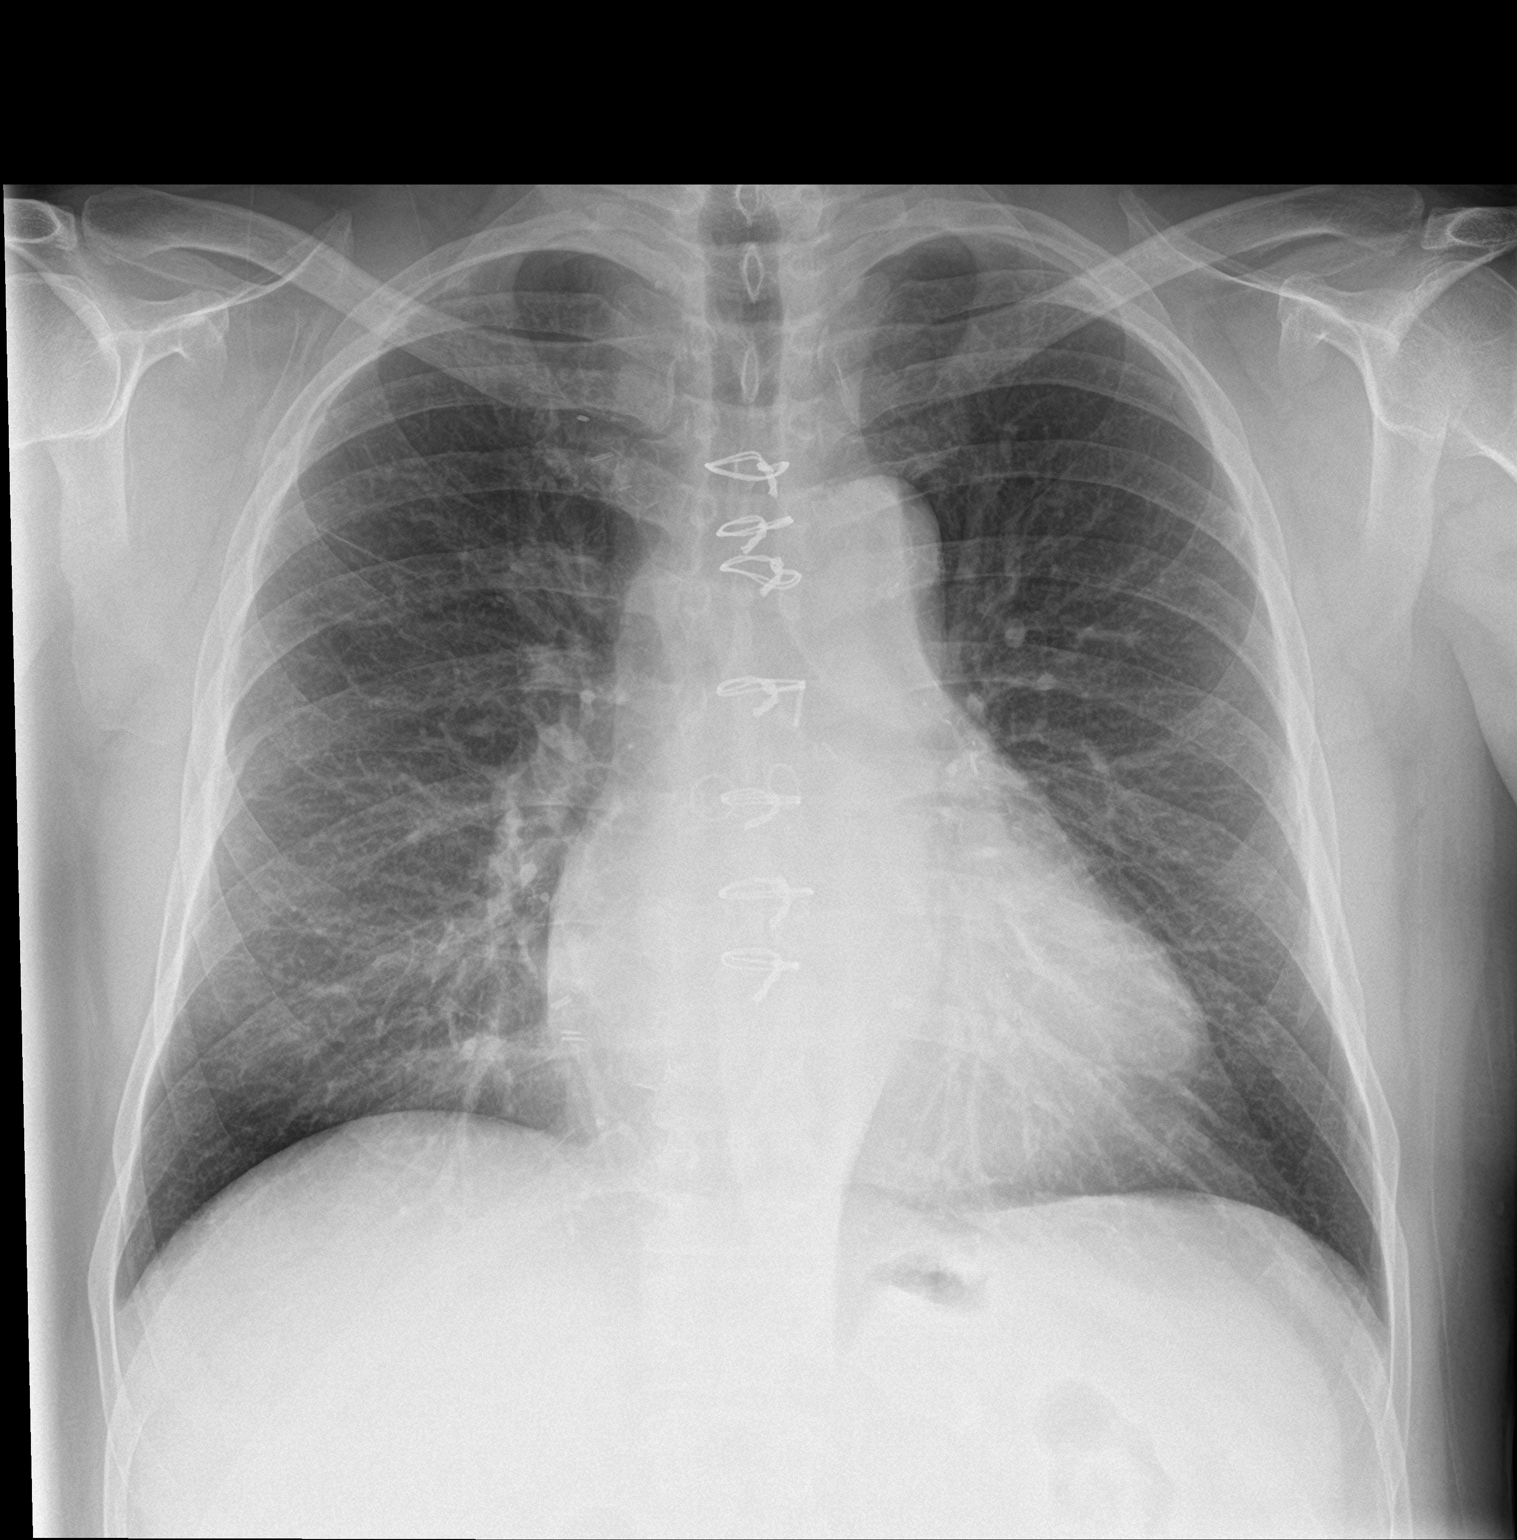

[chest lat]
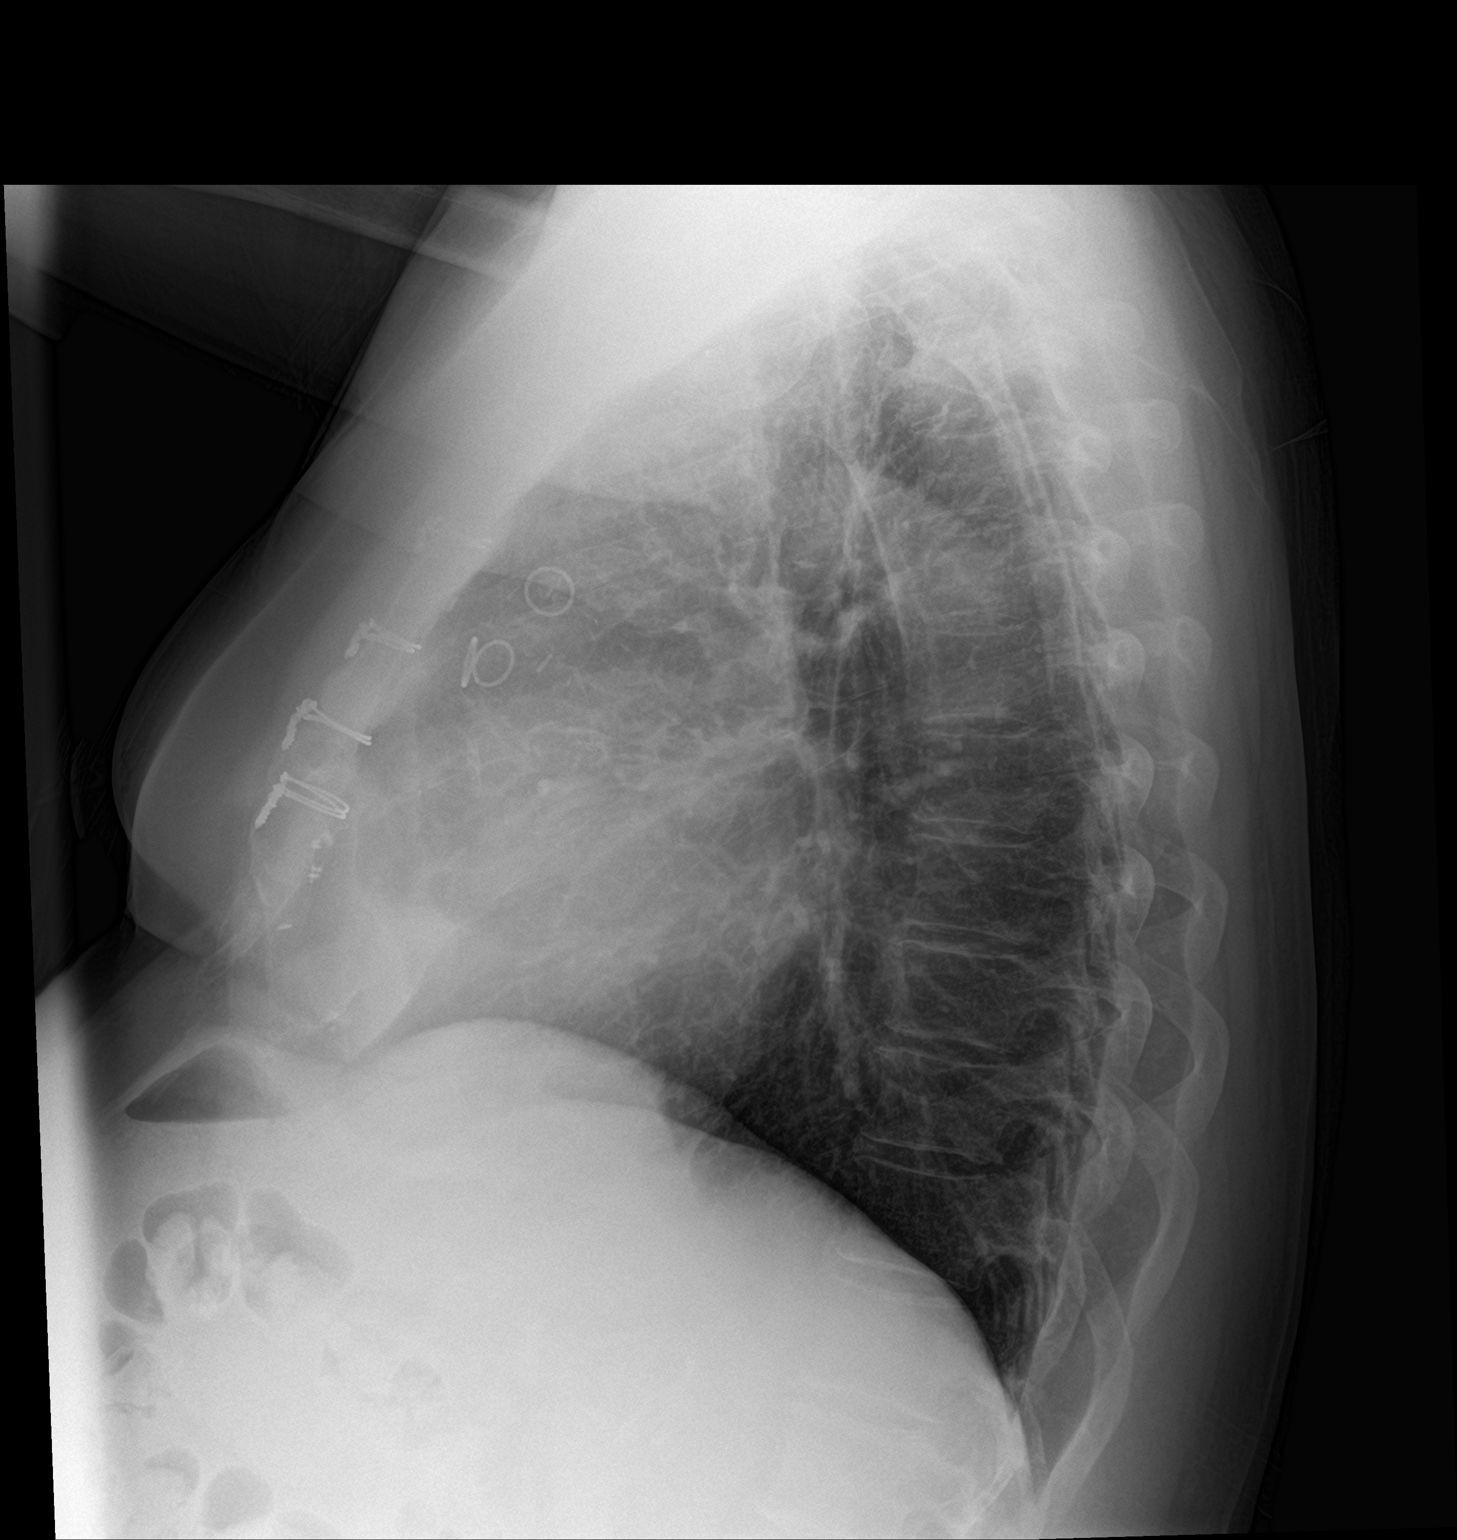

[2 of 2 positions shown; findings below may reference images not displayed]

FINDINGS: Patchy right lower lung opacity has resolved. There remains some
interstitial prominence in this region. No pleural effusion. Stable
mild cardiomegaly. No acute osseous abnormality.
IMPRESSION: Patchy right lower lung opacity has resolved. Remains some
interstitial prominence in this region that may reflect resolving
pneumonia or scarring.

## 2022-02-03 ENCOUNTER — Other Ambulatory Visit: Payer: Self-pay | Admitting: Nurse Practitioner

## 2022-02-03 ENCOUNTER — Other Ambulatory Visit (HOSPITAL_COMMUNITY): Payer: Self-pay

## 2022-02-04 ENCOUNTER — Other Ambulatory Visit (HOSPITAL_COMMUNITY): Payer: Self-pay

## 2022-02-04 MED ORDER — PREDNISONE 5 MG PO TABS
5.0000 mg | ORAL_TABLET | Freq: Every day | ORAL | 7 refills | Status: DC
  Filled 2022-02-04 – 2022-02-15 (×2): qty 30, 30d supply, fill #0
  Filled 2022-03-22: qty 30, 30d supply, fill #1
  Filled 2022-04-20: qty 30, 30d supply, fill #2
  Filled 2022-05-17: qty 30, 30d supply, fill #3
  Filled 2022-06-18: qty 30, 30d supply, fill #4
  Filled 2022-07-15: qty 30, 30d supply, fill #5
  Filled 2022-08-12 – 2022-08-13 (×2): qty 30, 30d supply, fill #6
  Filled 2022-09-12: qty 30, 30d supply, fill #7

## 2022-02-04 MED ORDER — TACROLIMUS 1 MG PO CAPS
1.0000 mg | ORAL_CAPSULE | Freq: Every morning | ORAL | 5 refills | Status: DC
  Filled 2022-02-04: qty 30, 30d supply, fill #0
  Filled 2022-03-13: qty 30, 30d supply, fill #1
  Filled 2022-04-12: qty 30, 30d supply, fill #2
  Filled 2022-05-10: qty 30, 30d supply, fill #3
  Filled 2022-06-05: qty 30, 30d supply, fill #4
  Filled 2022-07-03: qty 30, 30d supply, fill #5
  Filled ????-??-??: fill #1

## 2022-02-15 ENCOUNTER — Other Ambulatory Visit (HOSPITAL_COMMUNITY): Payer: Self-pay

## 2022-02-18 ENCOUNTER — Other Ambulatory Visit (HOSPITAL_COMMUNITY): Payer: Self-pay

## 2022-02-18 MED ORDER — MYCOPHENOLATE SODIUM 180 MG PO TBEC
720.0000 mg | DELAYED_RELEASE_TABLET | Freq: Two times a day (BID) | ORAL | 5 refills | Status: DC
  Filled 2022-02-18: qty 240, 30d supply, fill #0
  Filled 2022-03-22: qty 240, 30d supply, fill #1
  Filled 2022-04-20: qty 240, 30d supply, fill #2
  Filled 2022-05-17: qty 240, 30d supply, fill #3
  Filled 2022-06-18: qty 240, 30d supply, fill #4
  Filled 2022-07-15: qty 240, 30d supply, fill #5

## 2022-02-19 ENCOUNTER — Other Ambulatory Visit: Payer: Self-pay | Admitting: Internal Medicine

## 2022-02-19 ENCOUNTER — Other Ambulatory Visit (HOSPITAL_COMMUNITY): Payer: Self-pay

## 2022-02-20 ENCOUNTER — Other Ambulatory Visit (HOSPITAL_COMMUNITY): Payer: Self-pay

## 2022-02-20 MED ORDER — METOPROLOL TARTRATE 50 MG PO TABS
50.0000 mg | ORAL_TABLET | Freq: Every day | ORAL | 1 refills | Status: DC
  Filled 2022-02-20: qty 90, 90d supply, fill #0
  Filled 2022-05-17: qty 90, 90d supply, fill #1

## 2022-02-25 ENCOUNTER — Other Ambulatory Visit (HOSPITAL_COMMUNITY): Payer: Self-pay

## 2022-03-04 ENCOUNTER — Other Ambulatory Visit (HOSPITAL_COMMUNITY): Payer: Self-pay

## 2022-03-04 MED ORDER — FENOFIBRATE MICRONIZED 134 MG PO CAPS
134.0000 mg | ORAL_CAPSULE | Freq: Every day | ORAL | 5 refills | Status: DC
  Filled 2022-03-04: qty 30, 30d supply, fill #0
  Filled 2022-04-12: qty 30, 30d supply, fill #1
  Filled 2022-05-17: qty 30, 30d supply, fill #2
  Filled 2022-06-18 – 2022-07-15 (×2): qty 30, 30d supply, fill #3

## 2022-03-06 ENCOUNTER — Other Ambulatory Visit (HOSPITAL_COMMUNITY): Payer: Self-pay

## 2022-03-13 ENCOUNTER — Other Ambulatory Visit (HOSPITAL_COMMUNITY): Payer: Self-pay

## 2022-03-22 ENCOUNTER — Other Ambulatory Visit: Payer: Self-pay | Admitting: Internal Medicine

## 2022-03-22 ENCOUNTER — Other Ambulatory Visit (HOSPITAL_COMMUNITY): Payer: Self-pay

## 2022-03-25 ENCOUNTER — Other Ambulatory Visit (HOSPITAL_COMMUNITY): Payer: Self-pay

## 2022-03-25 MED ORDER — TACROLIMUS 0.5 MG PO CAPS
0.5000 mg | ORAL_CAPSULE | Freq: Every morning | ORAL | 5 refills | Status: DC
  Filled 2022-03-25: qty 30, 30d supply, fill #0
  Filled 2022-04-20: qty 30, 30d supply, fill #1
  Filled 2022-05-17: qty 30, 30d supply, fill #2
  Filled 2022-06-18: qty 30, 30d supply, fill #3
  Filled 2022-07-15: qty 30, 30d supply, fill #4
  Filled 2022-08-12: qty 30, 30d supply, fill #5

## 2022-03-25 MED ORDER — OMEPRAZOLE 20 MG PO CPDR
20.0000 mg | DELAYED_RELEASE_CAPSULE | Freq: Every day | ORAL | 5 refills | Status: DC
  Filled 2022-03-25: qty 30, 30d supply, fill #0
  Filled 2022-04-20: qty 30, 30d supply, fill #1
  Filled 2022-05-17: qty 30, 30d supply, fill #2
  Filled 2022-06-18: qty 30, 30d supply, fill #3

## 2022-03-25 MED ORDER — SULFAMETHOXAZOLE-TRIMETHOPRIM 400-80 MG PO TABS
1.0000 | ORAL_TABLET | ORAL | 5 refills | Status: DC
  Filled 2022-03-25: qty 12, 28d supply, fill #0
  Filled 2022-04-20: qty 12, 28d supply, fill #1
  Filled 2022-05-17: qty 12, 28d supply, fill #2
  Filled 2022-06-18: qty 12, 28d supply, fill #3
  Filled 2022-07-15: qty 12, 28d supply, fill #4
  Filled 2022-08-12: qty 12, 28d supply, fill #5

## 2022-03-26 ENCOUNTER — Other Ambulatory Visit (HOSPITAL_COMMUNITY): Payer: Self-pay

## 2022-03-26 MED ORDER — ICOSAPENT ETHYL 1 G PO CAPS
2.0000 g | ORAL_CAPSULE | Freq: Two times a day (BID) | ORAL | 0 refills | Status: DC
Start: 2022-03-26 — End: 2022-04-09
  Filled 2022-03-26: qty 120, 30d supply, fill #0

## 2022-04-05 ENCOUNTER — Other Ambulatory Visit (HOSPITAL_COMMUNITY): Payer: Self-pay

## 2022-04-09 ENCOUNTER — Other Ambulatory Visit (HOSPITAL_COMMUNITY): Payer: Self-pay

## 2022-04-09 DIAGNOSIS — E785 Hyperlipidemia, unspecified: Secondary | ICD-10-CM | POA: Diagnosis not present

## 2022-04-09 DIAGNOSIS — I129 Hypertensive chronic kidney disease with stage 1 through stage 4 chronic kidney disease, or unspecified chronic kidney disease: Secondary | ICD-10-CM | POA: Diagnosis not present

## 2022-04-09 DIAGNOSIS — I4892 Unspecified atrial flutter: Secondary | ICD-10-CM | POA: Diagnosis not present

## 2022-04-09 DIAGNOSIS — D751 Secondary polycythemia: Secondary | ICD-10-CM | POA: Diagnosis not present

## 2022-04-09 DIAGNOSIS — Z940 Kidney transplant status: Secondary | ICD-10-CM | POA: Diagnosis not present

## 2022-04-09 DIAGNOSIS — I2510 Atherosclerotic heart disease of native coronary artery without angina pectoris: Secondary | ICD-10-CM | POA: Diagnosis not present

## 2022-04-09 DIAGNOSIS — I4891 Unspecified atrial fibrillation: Secondary | ICD-10-CM | POA: Diagnosis not present

## 2022-04-09 DIAGNOSIS — N400 Enlarged prostate without lower urinary tract symptoms: Secondary | ICD-10-CM | POA: Diagnosis not present

## 2022-04-09 MED ORDER — ICOSAPENT ETHYL 1 G PO CAPS
2.0000 g | ORAL_CAPSULE | Freq: Two times a day (BID) | ORAL | 5 refills | Status: DC
  Filled 2022-04-09 – 2022-04-20 (×2): qty 120, 30d supply, fill #0
  Filled 2022-05-17: qty 120, 30d supply, fill #1
  Filled 2022-06-18: qty 120, 30d supply, fill #2
  Filled 2022-07-15: qty 120, 30d supply, fill #3
  Filled 2022-08-12: qty 120, 30d supply, fill #4
  Filled 2022-09-12: qty 120, 30d supply, fill #5

## 2022-04-10 ENCOUNTER — Other Ambulatory Visit (HOSPITAL_COMMUNITY): Payer: Self-pay

## 2022-04-15 ENCOUNTER — Other Ambulatory Visit (HOSPITAL_COMMUNITY): Payer: Self-pay

## 2022-04-19 ENCOUNTER — Other Ambulatory Visit (HOSPITAL_COMMUNITY): Payer: Self-pay

## 2022-04-20 ENCOUNTER — Other Ambulatory Visit (HOSPITAL_COMMUNITY): Payer: Self-pay

## 2022-04-20 ENCOUNTER — Other Ambulatory Visit: Payer: Self-pay | Admitting: Internal Medicine

## 2022-04-22 ENCOUNTER — Other Ambulatory Visit (HOSPITAL_COMMUNITY): Payer: Self-pay

## 2022-04-22 MED ORDER — ALLOPURINOL 100 MG PO TABS
200.0000 mg | ORAL_TABLET | Freq: Every day | ORAL | 5 refills | Status: DC
  Filled 2022-04-22: qty 60, 30d supply, fill #0
  Filled 2022-05-17: qty 60, 30d supply, fill #1
  Filled 2022-06-18: qty 60, 30d supply, fill #2
  Filled 2022-07-15: qty 60, 30d supply, fill #3
  Filled 2022-08-12: qty 60, 30d supply, fill #4
  Filled 2022-09-12: qty 60, 30d supply, fill #5

## 2022-04-23 ENCOUNTER — Other Ambulatory Visit (HOSPITAL_COMMUNITY): Payer: Self-pay

## 2022-04-23 MED ORDER — CLONIDINE HCL 0.1 MG PO TABS
0.1000 mg | ORAL_TABLET | Freq: Every day | ORAL | 1 refills | Status: DC
  Filled 2022-04-23: qty 90, 90d supply, fill #0
  Filled 2022-05-17 – 2022-07-15 (×2): qty 90, 90d supply, fill #1

## 2022-05-13 ENCOUNTER — Other Ambulatory Visit (HOSPITAL_COMMUNITY): Payer: Self-pay

## 2022-05-17 ENCOUNTER — Other Ambulatory Visit (HOSPITAL_COMMUNITY): Payer: Self-pay

## 2022-05-20 ENCOUNTER — Other Ambulatory Visit (HOSPITAL_COMMUNITY): Payer: Self-pay

## 2022-05-21 ENCOUNTER — Other Ambulatory Visit (HOSPITAL_COMMUNITY): Payer: Self-pay

## 2022-05-21 MED ORDER — FENOFIBRATE MICRONIZED 134 MG PO CAPS
134.0000 mg | ORAL_CAPSULE | Freq: Every day | ORAL | 5 refills | Status: DC
  Filled 2022-05-21 – 2022-06-18 (×2): qty 30, 30d supply, fill #0
  Filled 2022-08-12: qty 30, 30d supply, fill #1

## 2022-05-21 MED ORDER — OMEPRAZOLE 20 MG PO CPDR
20.0000 mg | DELAYED_RELEASE_CAPSULE | Freq: Every day | ORAL | 5 refills | Status: DC
  Filled 2022-05-21 – 2022-06-18 (×2): qty 30, 30d supply, fill #0

## 2022-05-30 ENCOUNTER — Other Ambulatory Visit (HOSPITAL_COMMUNITY): Payer: Self-pay

## 2022-05-30 MED ORDER — TADALAFIL 5 MG PO TABS
5.0000 mg | ORAL_TABLET | Freq: Every day | ORAL | 1 refills | Status: DC
  Filled 2022-05-30: qty 90, 90d supply, fill #0

## 2022-06-05 ENCOUNTER — Other Ambulatory Visit (HOSPITAL_COMMUNITY): Payer: Self-pay

## 2022-06-18 ENCOUNTER — Other Ambulatory Visit (HOSPITAL_COMMUNITY): Payer: Self-pay

## 2022-06-19 ENCOUNTER — Other Ambulatory Visit (HOSPITAL_COMMUNITY): Payer: Self-pay

## 2022-06-19 MED ORDER — FENOFIBRATE MICRONIZED 134 MG PO CAPS
134.0000 mg | ORAL_CAPSULE | Freq: Every day | ORAL | 5 refills | Status: DC
  Filled 2022-06-19 – 2022-07-15 (×2): qty 30, 30d supply, fill #0
  Filled 2022-08-12: qty 30, 30d supply, fill #1

## 2022-06-19 MED ORDER — OMEPRAZOLE 20 MG PO CPDR
20.0000 mg | DELAYED_RELEASE_CAPSULE | Freq: Every day | ORAL | 5 refills | Status: DC
  Filled 2022-06-19 – 2022-07-15 (×2): qty 30, 30d supply, fill #0
  Filled 2022-08-12: qty 30, 30d supply, fill #1

## 2022-07-04 ENCOUNTER — Other Ambulatory Visit (HOSPITAL_COMMUNITY): Payer: Self-pay

## 2022-07-08 ENCOUNTER — Other Ambulatory Visit (HOSPITAL_COMMUNITY): Payer: Self-pay

## 2022-07-12 ENCOUNTER — Other Ambulatory Visit (HOSPITAL_COMMUNITY): Payer: Self-pay

## 2022-07-15 ENCOUNTER — Other Ambulatory Visit (HOSPITAL_COMMUNITY): Payer: Self-pay

## 2022-07-16 ENCOUNTER — Other Ambulatory Visit (HOSPITAL_COMMUNITY): Payer: Self-pay

## 2022-07-16 MED ORDER — ELIQUIS 5 MG PO TABS
5.0000 mg | ORAL_TABLET | Freq: Two times a day (BID) | ORAL | 5 refills | Status: DC
  Filled 2022-07-16: qty 60, 30d supply, fill #0
  Filled 2022-08-12: qty 60, 30d supply, fill #1
  Filled 2022-09-12: qty 60, 30d supply, fill #2
  Filled 2022-10-15: qty 60, 30d supply, fill #3
  Filled 2022-11-11: qty 60, 30d supply, fill #4
  Filled 2022-12-11: qty 60, 30d supply, fill #5

## 2022-07-19 ENCOUNTER — Other Ambulatory Visit (HOSPITAL_COMMUNITY): Payer: Self-pay

## 2022-07-19 MED ORDER — FENOFIBRATE MICRONIZED 134 MG PO CAPS
134.0000 mg | ORAL_CAPSULE | Freq: Every day | ORAL | 5 refills | Status: DC
  Filled 2022-07-19 – 2022-08-12 (×2): qty 30, 30d supply, fill #0
  Filled 2022-09-12: qty 27, 27d supply, fill #1
  Filled 2022-09-13: qty 3, 3d supply, fill #1
  Filled 2022-10-15: qty 30, 30d supply, fill #2
  Filled 2022-11-11: qty 30, 30d supply, fill #3
  Filled 2022-12-11: qty 30, 30d supply, fill #4
  Filled 2023-01-08: qty 30, 30d supply, fill #5

## 2022-07-19 MED ORDER — OMEPRAZOLE 20 MG PO CPDR
20.0000 mg | DELAYED_RELEASE_CAPSULE | Freq: Every day | ORAL | 5 refills | Status: DC
  Filled 2022-07-19 – 2022-08-12 (×2): qty 30, 30d supply, fill #0
  Filled 2022-09-12: qty 30, 30d supply, fill #1
  Filled 2022-10-15: qty 30, 30d supply, fill #2
  Filled 2022-11-11: qty 30, 30d supply, fill #3
  Filled 2022-12-11: qty 30, 30d supply, fill #4
  Filled 2023-01-08: qty 30, 30d supply, fill #5

## 2022-07-29 ENCOUNTER — Other Ambulatory Visit (HOSPITAL_COMMUNITY): Payer: Self-pay

## 2022-07-30 ENCOUNTER — Other Ambulatory Visit (HOSPITAL_COMMUNITY): Payer: Self-pay

## 2022-07-30 MED ORDER — TACROLIMUS 1 MG PO CAPS
1.0000 mg | ORAL_CAPSULE | Freq: Every morning | ORAL | 5 refills | Status: DC
  Filled 2022-07-30: qty 30, 30d supply, fill #0
  Filled 2022-08-19: qty 30, 30d supply, fill #1
  Filled 2022-09-24: qty 30, 30d supply, fill #2
  Filled 2022-10-24: qty 30, 30d supply, fill #3
  Filled 2022-11-27: qty 30, 30d supply, fill #4
  Filled 2023-01-01: qty 30, 30d supply, fill #5

## 2022-08-12 ENCOUNTER — Other Ambulatory Visit: Payer: Self-pay | Admitting: Internal Medicine

## 2022-08-12 ENCOUNTER — Other Ambulatory Visit (HOSPITAL_COMMUNITY): Payer: Self-pay

## 2022-08-13 ENCOUNTER — Other Ambulatory Visit: Payer: Self-pay

## 2022-08-13 ENCOUNTER — Other Ambulatory Visit (HOSPITAL_COMMUNITY): Payer: Self-pay

## 2022-08-13 MED ORDER — CLONIDINE HCL 0.1 MG PO TABS
0.1000 mg | ORAL_TABLET | Freq: Every day | ORAL | 0 refills | Status: DC
  Filled 2022-08-13 – 2022-10-15 (×2): qty 30, 30d supply, fill #0

## 2022-08-13 MED ORDER — METOPROLOL TARTRATE 50 MG PO TABS
50.0000 mg | ORAL_TABLET | Freq: Every day | ORAL | 0 refills | Status: DC
  Filled 2022-08-13: qty 30, 30d supply, fill #0

## 2022-08-13 MED ORDER — MYCOPHENOLATE SODIUM 180 MG PO TBEC
720.0000 mg | DELAYED_RELEASE_TABLET | Freq: Two times a day (BID) | ORAL | 5 refills | Status: DC
  Filled 2022-08-13: qty 240, 30d supply, fill #0
  Filled 2022-09-12: qty 240, 30d supply, fill #1
  Filled 2022-10-15: qty 240, 30d supply, fill #2
  Filled 2022-11-11: qty 240, 30d supply, fill #3
  Filled 2022-12-11: qty 240, 30d supply, fill #4
  Filled 2023-01-08: qty 240, 30d supply, fill #5

## 2022-08-14 ENCOUNTER — Other Ambulatory Visit (HOSPITAL_COMMUNITY): Payer: Self-pay

## 2022-08-15 DIAGNOSIS — Z940 Kidney transplant status: Secondary | ICD-10-CM | POA: Diagnosis not present

## 2022-08-19 ENCOUNTER — Other Ambulatory Visit (HOSPITAL_COMMUNITY): Payer: Self-pay

## 2022-08-20 ENCOUNTER — Other Ambulatory Visit (HOSPITAL_COMMUNITY): Payer: Self-pay

## 2022-08-20 MED ORDER — TACROLIMUS 0.5 MG PO CAPS
0.5000 mg | ORAL_CAPSULE | Freq: Every evening | ORAL | 5 refills | Status: DC
  Filled 2022-08-20: qty 30, 30d supply, fill #0
  Filled 2022-09-12: qty 30, 30d supply, fill #1
  Filled 2022-10-15: qty 30, 30d supply, fill #2
  Filled 2022-11-11: qty 30, 30d supply, fill #3
  Filled 2022-12-11: qty 30, 30d supply, fill #4
  Filled 2023-01-08: qty 30, 30d supply, fill #5

## 2022-08-23 ENCOUNTER — Other Ambulatory Visit (HOSPITAL_COMMUNITY): Payer: Self-pay

## 2022-08-27 DIAGNOSIS — D225 Melanocytic nevi of trunk: Secondary | ICD-10-CM | POA: Diagnosis not present

## 2022-08-27 DIAGNOSIS — D2261 Melanocytic nevi of right upper limb, including shoulder: Secondary | ICD-10-CM | POA: Diagnosis not present

## 2022-08-27 DIAGNOSIS — L812 Freckles: Secondary | ICD-10-CM | POA: Diagnosis not present

## 2022-08-27 DIAGNOSIS — D1801 Hemangioma of skin and subcutaneous tissue: Secondary | ICD-10-CM | POA: Diagnosis not present

## 2022-08-27 DIAGNOSIS — L821 Other seborrheic keratosis: Secondary | ICD-10-CM | POA: Diagnosis not present

## 2022-08-29 ENCOUNTER — Other Ambulatory Visit (HOSPITAL_COMMUNITY): Payer: Self-pay

## 2022-08-29 DIAGNOSIS — Z940 Kidney transplant status: Secondary | ICD-10-CM | POA: Diagnosis not present

## 2022-08-29 DIAGNOSIS — E785 Hyperlipidemia, unspecified: Secondary | ICD-10-CM | POA: Diagnosis not present

## 2022-08-29 DIAGNOSIS — D751 Secondary polycythemia: Secondary | ICD-10-CM | POA: Diagnosis not present

## 2022-08-29 DIAGNOSIS — M109 Gout, unspecified: Secondary | ICD-10-CM | POA: Diagnosis not present

## 2022-08-29 DIAGNOSIS — I2510 Atherosclerotic heart disease of native coronary artery without angina pectoris: Secondary | ICD-10-CM | POA: Diagnosis not present

## 2022-08-29 DIAGNOSIS — I129 Hypertensive chronic kidney disease with stage 1 through stage 4 chronic kidney disease, or unspecified chronic kidney disease: Secondary | ICD-10-CM | POA: Diagnosis not present

## 2022-08-29 DIAGNOSIS — N400 Enlarged prostate without lower urinary tract symptoms: Secondary | ICD-10-CM | POA: Diagnosis not present

## 2022-08-29 DIAGNOSIS — I4891 Unspecified atrial fibrillation: Secondary | ICD-10-CM | POA: Diagnosis not present

## 2022-08-29 MED ORDER — LOSARTAN POTASSIUM 25 MG PO TABS
25.0000 mg | ORAL_TABLET | Freq: Every day | ORAL | 6 refills | Status: DC
Start: 2022-08-29 — End: 2023-04-09
  Filled 2022-08-29: qty 30, 30d supply, fill #0
  Filled 2022-10-09: qty 30, 30d supply, fill #1
  Filled 2022-11-06: qty 30, 30d supply, fill #2
  Filled 2022-11-27: qty 30, 30d supply, fill #3
  Filled 2023-01-08: qty 30, 30d supply, fill #4
  Filled 2023-02-06: qty 30, 30d supply, fill #5
  Filled 2023-03-13: qty 30, 30d supply, fill #6

## 2022-08-29 MED ORDER — METOPROLOL TARTRATE 25 MG PO TABS
25.0000 mg | ORAL_TABLET | Freq: Every day | ORAL | 5 refills | Status: DC
  Filled 2022-08-29: qty 30, 30d supply, fill #0
  Filled 2022-10-09: qty 30, 30d supply, fill #1
  Filled 2022-11-06: qty 30, 30d supply, fill #2

## 2022-09-12 ENCOUNTER — Other Ambulatory Visit (HOSPITAL_COMMUNITY): Payer: Self-pay

## 2022-09-12 MED ORDER — SULFAMETHOXAZOLE-TRIMETHOPRIM 400-80 MG PO TABS
1.0000 | ORAL_TABLET | ORAL | 5 refills | Status: DC
Start: 2022-09-13 — End: 2023-02-27
  Filled 2022-09-12: qty 12, 28d supply, fill #0
  Filled 2022-10-09: qty 12, 28d supply, fill #1
  Filled 2022-11-06: qty 12, 28d supply, fill #2
  Filled 2022-11-27: qty 12, 28d supply, fill #3
  Filled 2023-01-01: qty 12, 28d supply, fill #4
  Filled 2023-01-30: qty 12, 28d supply, fill #5

## 2022-09-13 ENCOUNTER — Other Ambulatory Visit (HOSPITAL_COMMUNITY): Payer: Self-pay

## 2022-09-23 DIAGNOSIS — Z940 Kidney transplant status: Secondary | ICD-10-CM | POA: Diagnosis not present

## 2022-09-25 ENCOUNTER — Other Ambulatory Visit (HOSPITAL_COMMUNITY): Payer: Self-pay

## 2022-09-26 DIAGNOSIS — K625 Hemorrhage of anus and rectum: Secondary | ICD-10-CM | POA: Diagnosis not present

## 2022-09-26 DIAGNOSIS — K648 Other hemorrhoids: Secondary | ICD-10-CM | POA: Diagnosis not present

## 2022-10-09 ENCOUNTER — Other Ambulatory Visit: Payer: Self-pay | Admitting: Internal Medicine

## 2022-10-10 ENCOUNTER — Other Ambulatory Visit (HOSPITAL_COMMUNITY): Payer: Self-pay

## 2022-10-11 ENCOUNTER — Other Ambulatory Visit (HOSPITAL_COMMUNITY): Payer: Self-pay

## 2022-10-11 MED ORDER — AMIODARONE HCL 200 MG PO TABS
200.0000 mg | ORAL_TABLET | Freq: Every day | ORAL | 0 refills | Status: DC
  Filled 2022-10-11: qty 30, 30d supply, fill #0

## 2022-10-15 ENCOUNTER — Other Ambulatory Visit (HOSPITAL_COMMUNITY): Payer: Self-pay

## 2022-10-16 ENCOUNTER — Other Ambulatory Visit (HOSPITAL_COMMUNITY): Payer: Self-pay

## 2022-10-16 MED ORDER — ALLOPURINOL 100 MG PO TABS
200.0000 mg | ORAL_TABLET | Freq: Every day | ORAL | 5 refills | Status: DC
  Filled 2022-10-16: qty 60, 30d supply, fill #0
  Filled 2022-11-11: qty 60, 30d supply, fill #1
  Filled 2022-12-11: qty 60, 30d supply, fill #2
  Filled 2023-01-08: qty 60, 30d supply, fill #3
  Filled 2023-02-06: qty 60, 30d supply, fill #4
  Filled 2023-03-13: qty 60, 30d supply, fill #5

## 2022-10-16 MED ORDER — ICOSAPENT ETHYL 1 G PO CAPS
2.0000 g | ORAL_CAPSULE | Freq: Two times a day (BID) | ORAL | 5 refills | Status: DC
  Filled 2022-10-16: qty 120, 30d supply, fill #0
  Filled 2022-11-11: qty 120, 30d supply, fill #1
  Filled 2022-12-11: qty 120, 30d supply, fill #2
  Filled 2023-01-08: qty 120, 30d supply, fill #3
  Filled 2023-02-06: qty 120, 30d supply, fill #4
  Filled 2023-03-13: qty 120, 30d supply, fill #5

## 2022-10-17 ENCOUNTER — Other Ambulatory Visit (HOSPITAL_COMMUNITY): Payer: Self-pay

## 2022-10-23 ENCOUNTER — Ambulatory Visit: Payer: 59 | Admitting: Internal Medicine

## 2022-10-24 ENCOUNTER — Other Ambulatory Visit (HOSPITAL_COMMUNITY): Payer: Self-pay

## 2022-10-25 ENCOUNTER — Other Ambulatory Visit (HOSPITAL_COMMUNITY): Payer: Self-pay

## 2022-10-25 MED ORDER — PREDNISONE 5 MG PO TABS
5.0000 mg | ORAL_TABLET | Freq: Every day | ORAL | 5 refills | Status: DC
  Filled 2022-10-25: qty 30, 30d supply, fill #0
  Filled 2022-11-27: qty 30, 30d supply, fill #1
  Filled 2023-01-01: qty 30, 30d supply, fill #2
  Filled 2023-01-30: qty 30, 30d supply, fill #3
  Filled 2023-02-27: qty 30, 30d supply, fill #4
  Filled 2023-03-29: qty 30, 30d supply, fill #5

## 2022-11-02 ENCOUNTER — Other Ambulatory Visit: Payer: Self-pay

## 2022-11-02 ENCOUNTER — Emergency Department (HOSPITAL_BASED_OUTPATIENT_CLINIC_OR_DEPARTMENT_OTHER)
Admission: EM | Admit: 2022-11-02 | Discharge: 2022-11-02 | Disposition: A | Discharge status: Home / Self Care | Payer: 59 | Attending: Emergency Medicine | Admitting: Emergency Medicine

## 2022-11-02 ENCOUNTER — Encounter (HOSPITAL_BASED_OUTPATIENT_CLINIC_OR_DEPARTMENT_OTHER): Payer: Self-pay

## 2022-11-02 ENCOUNTER — Emergency Department (HOSPITAL_BASED_OUTPATIENT_CLINIC_OR_DEPARTMENT_OTHER): Payer: 59

## 2022-11-02 DIAGNOSIS — Z1152 Encounter for screening for COVID-19: Secondary | ICD-10-CM | POA: Insufficient documentation

## 2022-11-02 DIAGNOSIS — J181 Lobar pneumonia, unspecified organism: Secondary | ICD-10-CM | POA: Diagnosis not present

## 2022-11-02 DIAGNOSIS — R059 Cough, unspecified: Secondary | ICD-10-CM | POA: Diagnosis not present

## 2022-11-02 DIAGNOSIS — E785 Hyperlipidemia, unspecified: Secondary | ICD-10-CM | POA: Diagnosis not present

## 2022-11-02 DIAGNOSIS — N189 Chronic kidney disease, unspecified: Secondary | ICD-10-CM | POA: Diagnosis not present

## 2022-11-02 DIAGNOSIS — I4891 Unspecified atrial fibrillation: Secondary | ICD-10-CM | POA: Insufficient documentation

## 2022-11-02 DIAGNOSIS — Z79899 Other long term (current) drug therapy: Secondary | ICD-10-CM | POA: Diagnosis not present

## 2022-11-02 DIAGNOSIS — J189 Pneumonia, unspecified organism: Secondary | ICD-10-CM

## 2022-11-02 DIAGNOSIS — Z7901 Long term (current) use of anticoagulants: Secondary | ICD-10-CM | POA: Insufficient documentation

## 2022-11-02 LAB — CBC WITH DIFFERENTIAL/PLATELET
Abs Immature Granulocytes: 0.05 10*3/uL (ref 0.00–0.07)
Basophils Absolute: 0 10*3/uL (ref 0.0–0.1)
Basophils Relative: 1 %
Eosinophils Absolute: 0.1 10*3/uL (ref 0.0–0.5)
Eosinophils Relative: 2 %
HCT: 48.5 % (ref 39.0–52.0)
Hemoglobin: 15.6 g/dL (ref 13.0–17.0)
Immature Granulocytes: 1 %
Lymphocytes Relative: 21 %
Lymphs Abs: 1 10*3/uL (ref 0.7–4.0)
MCH: 28.5 pg (ref 26.0–34.0)
MCHC: 32.2 g/dL (ref 30.0–36.0)
MCV: 88.5 fL (ref 80.0–100.0)
Monocytes Absolute: 0.5 10*3/uL (ref 0.1–1.0)
Monocytes Relative: 11 %
Neutro Abs: 3.2 10*3/uL (ref 1.7–7.7)
Neutrophils Relative %: 64 %
Platelets: 150 10*3/uL (ref 150–400)
RBC: 5.48 MIL/uL (ref 4.22–5.81)
RDW: 14.1 % (ref 11.5–15.5)
WBC: 4.9 10*3/uL (ref 4.0–10.5)
nRBC: 0 % (ref 0.0–0.2)

## 2022-11-02 LAB — BASIC METABOLIC PANEL
Anion gap: 5 (ref 5–15)
BUN: 24 mg/dL — ABNORMAL HIGH (ref 6–20)
CO2: 26 mmol/L (ref 22–32)
Calcium: 9.1 mg/dL (ref 8.9–10.3)
Chloride: 106 mmol/L (ref 98–111)
Creatinine, Ser: 1.67 mg/dL — ABNORMAL HIGH (ref 0.61–1.24)
GFR, Estimated: 47 mL/min — ABNORMAL LOW (ref >60)
Glucose, Bld: 115 mg/dL — ABNORMAL HIGH (ref 70–99)
Potassium: 4 mmol/L (ref 3.5–5.1)
Sodium: 137 mmol/L (ref 135–145)

## 2022-11-02 LAB — RESP PANEL BY RT-PCR (FLU A&B, COVID) ARPGX2
Influenza A by PCR: NEGATIVE
Influenza B by PCR: NEGATIVE
SARS Coronavirus 2 by RT PCR: NEGATIVE

## 2022-11-02 MED ORDER — DOXYCYCLINE HYCLATE 100 MG PO CAPS: 100.0000 mg | ORAL_CAPSULE | Freq: Two times a day (BID) | ORAL | 0 refills | Status: AC

## 2022-11-02 MED ORDER — DEXAMETHASONE 4 MG PO TABS
10.0000 mg | ORAL_TABLET | Freq: Once | ORAL | Status: AC
  Administered 2022-11-02: 10 mg via ORAL
  Filled 2022-11-02: qty 3

## 2022-11-02 MED ORDER — CEFDINIR 300 MG PO CAPS: 300.0000 mg | ORAL_CAPSULE | Freq: Two times a day (BID) | ORAL | 0 refills | Status: AC

## 2022-11-02 NOTE — ED Triage Notes (Signed)
Cough x1 week. Immunocompromised (Hx kidney transplant). Pt reports feeling weak/fatigued/having some wheezing/intermittent fever (took tylenol couple days ago). Denies SOB.

## 2022-11-02 NOTE — ED Notes (Signed)
Pulled serum lactate hold, gold, and dark green.

## 2022-11-02 NOTE — ED Notes (Signed)
Pt has recurring issue of ear wax buildup as well, secondary finding with otoscope, probably could use some irrigation.

## 2022-11-02 NOTE — ED Provider Notes (Signed)
[facility] EMERGENCY DEPARTMENT Provider Note   CSN: [id number] Arrival date & time: 11/02/22  1423     History {Add pertinent medical, surgical, social history, OB history to HPI:1} Chief Complaint  Patient presents with   Cough    [name] is a 61 y.o. male.  HPI     61 year old male with a history of coronary artery disease status post CABG in 2013, CKD status post renal transplant, hyperlipidemia, hypertension, atrial fibrillation on Eliquis,   1 week ago or more, had grandchildren who all ended up with ear infections, next day had a sore throat, sneezing, coughing, congestion, blowing nose, watery eyes almost like allergies and then turned into a cough. Thought it was getting better every day and then yesterday and last night cough significant worse, productive last few days, productive from deeper in chest, same thing 11/2021.  Kidney dr said to go in. Felt like had a fever, feeling crummy, highest recorded was 99.9 but did not continue to check.  No dyspnea or chest pain. No leg swelling. No urinary symptoms, nausea, vomiting. A little diarrhea yesterday none today     Past Medical History:  Diagnosis Date   Arteriovenous fistula, acquired ([place])    Atrial fibrillation and flutter (HCC)    Bladder stones    BMI 31.0-31.9,adult    CAD (coronary artery disease)    a. s/p CABG 03/2012: LIMA to LAD, free RIMA to OM1, SVG to D1, sequential SVG to AM and LPLB2, EVH via right thigh and leg.   Cellulitis 04/13/2012   RLE saphenous vein harvest incision    CKD (chronic kidney disease)    Dyslipidemia    Gout    History of kidney transplant    Hyperglycemia    Hyperlipidemia    Hyperparathyroidism    Hypertension    Hypertrophic cardiomyopathy (HCC)    Hypomagnesemia    Immunosuppression (HCC)    Intermittent palpitations    Microscopic hematuria    Migraines    PAF and Flutter    a. Afib post-op CABG; AFlutter 10/2012   Peripheral vascular disease  ([place])    Post-transplant erythrocytosis    Renal disease    S/P living-donor kidney transplantation    Sinus node dysfunction-post termination pause    a. >8sec in setting of aflutter 10/2012   Sleep pattern disturbance    Stroke [facility]) 1995; 2001   denies residual   Superficial vein thrombosis    a. R greater saphenous 04/2012   Vitamin D deficiency    VT (ventricular tachycardia) ([place])      Home Medications Prior to Admission medications   Medication Sig Start Date End Date Taking? Authorizing Provider  allopurinol (ZYLOPRIM) 100 MG tablet Take 2 tablets (200 mg total) by mouth daily. 10/16/22     amiodarone (PACERONE) 200 MG tablet Take 1 tablet (200 mg total) by mouth daily. 10/11/22   [name], MD  apixaban (ELIQUIS) 5 MG TABS tablet Take 1 tablet (5 mg total) by mouth 2 (two) times daily. 07/16/22     cloNIDine (CATAPRES) 0.1 MG tablet Take 1 tablet (0.1 mg total) by mouth daily. Please call to schedule an overdue appointment with Dr. Caryl Comes for refills, [phone], thank you. 1st attempt. 08/13/22   [name], MD  colchicine 0.6 MG tablet Take 1 tablet (0.6 mg total) by mouth daily as needed. Max 1 tablet daily 12/20/21     fenofibrate micronized (LOFIBRA) 134 MG capsule Take 1 capsule (134  mg total) by mouth daily. 07/19/22     fluticasone (FLONASE) 50 MCG/ACT nasal spray Place 1 spray into both nostrils as needed for allergies or rhinitis.    [provider]  furosemide (LASIX) 40 MG tablet Take 1 tablet (40 mg total) by mouth daily as needed. 08/07/21   [name], MD  icosapent Ethyl (VASCEPA) 1 g capsule Take 2 capsules (2 g total) by mouth 2 (two) times daily. 10/16/22     losartan (COZAAR) 25 MG tablet Take 1 tablet (25 mg total) by mouth daily. 08/29/22     magnesium oxide (MAG-OX) 400 (241.3 Mg) MG tablet Take 400 mg by mouth 2 (two) times daily.    [provider]  metoprolol tartrate (LOPRESSOR) 25 MG tablet Take 1 tablet (25 mg total) by  mouth at bedtime. 08/29/22     metoprolol tartrate (LOPRESSOR) 50 MG tablet Take 1 tablet (50 mg total) by mouth daily. Please call to schedule an overdue appointment with Dr. Caryl Comes for refills, [phone], thank you. 1st attempt. 08/13/22   [name], MD  mycophenolate (MYFORTIC) 180 MG EC tablet Take 4 tablets (720 mg total) by mouth 2 (two) times daily. 08/13/22     omeprazole (PRILOSEC) 20 MG capsule Take 1 capsule (20 mg total) by mouth daily. 07/19/22     predniSONE (DELTASONE) 5 MG tablet Take 1 tablet (5 mg total) by mouth daily. 10/25/22     rosuvastatin (CRESTOR) 20 MG tablet Take 1 tablet (20 mg total) by mouth daily. 08/22/21   [name], MD  sulfamethoxazole-trimethoprim (BACTRIM) 400-80 MG tablet Take 1 tablet by mouth 3 (three) times a week.)on Mondays, Wednesdays & Fridays) 09/13/22     tacrolimus (PROGRAF) 0.5 MG capsule Take 1 capsule (0.5 mg total) by mouth every evening. 08/20/22     tacrolimus (PROGRAF) 1 MG capsule Take 1 capsule (1 mg total) by mouth every morning. 07/30/22     tadalafil (CIALIS) 5 MG tablet Take 1 tablet (5 mg total) by mouth daily. 12/20/21         Allergies    Iodinated contrast media, Penicillins, and Lisinopril    Review of Systems   Review of Systems  Physical Exam Updated Vital Signs BP (!) 157/91   Pulse (!) 52   Temp 98.1 F (36.7 C) (Oral)   Resp 20   Ht '5\' 8"'$  (1.727 m)   Wt 88.5 kg   SpO2 98%   BMI 29.65 kg/m  Physical Exam Vitals and nursing note reviewed.  Constitutional:      General: He is not in acute distress.    Appearance: He is well-developed. He is not diaphoretic.  HENT:     Head: Normocephalic and atraumatic.  Eyes:     Conjunctiva/sclera: Conjunctivae normal.  Cardiovascular:     Rate and Rhythm: Normal rate and regular rhythm.     Heart sounds: Normal heart sounds. No murmur heard.    No friction rub. No gallop.  Pulmonary:     Effort: Pulmonary effort is normal. No respiratory distress.     Breath  sounds: Normal breath sounds. No wheezing or rales.  Abdominal:     General: There is no distension.     Palpations: Abdomen is soft.     Tenderness: There is no abdominal tenderness. There is no guarding.  Musculoskeletal:     Cervical back: Normal range of motion.  Skin:    General: Skin is warm and dry.  Neurological:  Mental Status: He is alert and oriented to person, place, and time.     ED Results / Procedures / Treatments   Labs (all labs ordered are listed, but only abnormal results are displayed) Labs Reviewed  BASIC METABOLIC PANEL - Abnormal; Notable for the following components:      Result Value   Glucose, Bld 115 (*)    BUN 24 (*)    Creatinine, Ser 1.67 (*)    GFR, Estimated 47 (*)    All other components within normal limits  RESP PANEL BY RT-PCR (FLU A&B, COVID) ARPGX2  CBC WITH DIFFERENTIAL/PLATELET    EKG None  Radiology DG Chest 2 View  Result Date: 11/02/2022 CLINICAL DATA:  61 year old male with cough and URI symptoms for 1 week. Kidney transplant patient, immunocompromised. EXAM: CHEST - 2 VIEW COMPARISON:  Chest radiographs 12/20/2021 and earlier. FINDINGS: Chronic sternotomy, CABG. Stable cardiac size and mediastinal contours. No cardiomegaly. Visualized tracheal air column is within normal limits. Lung markings appear stable since 2020 and within normal limits. Both lungs appear clear. No pneumothorax pleural effusion. No acute osseous abnormality identified. Negative visible bowel gas. IMPRESSION: No acute cardiopulmonary abnormality. Electronically Signed   By: Genevie Ann M.D.   On: 11/02/2022 15:04    Procedures Procedures  {Document cardiac monitor, telemetry assessment procedure when appropriate:1}  Medications Ordered in ED Medications - No data to display  ED Course/ Medical Decision Making/ A&P                           Medical Decision Making Amount and/or Complexity of Data Reviewed Labs: ordered. Radiology:  ordered.   ***  {Document critical care time when appropriate:1} {Document review of labs and clinical decision tools ie heart score, Chads2Vasc2 etc:1}  {Document your independent review of radiology images, and any outside records:1} {Document your discussion with family members, caretakers, and with consultants:1} {Document social determinants of health affecting pt's care:1} {Document your decision making why or why not admission, treatments were needed:1} Final Clinical Impression(s) / ED Diagnoses Final diagnoses:  None    Rx / DC Orders ED Discharge Orders     None

## 2022-11-06 ENCOUNTER — Other Ambulatory Visit (HOSPITAL_COMMUNITY): Payer: Self-pay

## 2022-11-11 ENCOUNTER — Other Ambulatory Visit: Payer: Self-pay

## 2022-11-11 ENCOUNTER — Other Ambulatory Visit: Payer: Self-pay | Admitting: Internal Medicine

## 2022-11-11 ENCOUNTER — Other Ambulatory Visit (HOSPITAL_COMMUNITY): Payer: Self-pay

## 2022-11-12 ENCOUNTER — Other Ambulatory Visit (HOSPITAL_COMMUNITY): Payer: Self-pay

## 2022-11-12 ENCOUNTER — Other Ambulatory Visit: Payer: Self-pay

## 2022-11-12 MED ORDER — AMIODARONE HCL 200 MG PO TABS
200.0000 mg | ORAL_TABLET | Freq: Every day | ORAL | 0 refills | Status: DC
  Filled 2022-11-12: qty 30, 30d supply, fill #0

## 2022-11-12 MED ORDER — ROSUVASTATIN CALCIUM 20 MG PO TABS
20.0000 mg | ORAL_TABLET | Freq: Every day | ORAL | 0 refills | Status: DC
  Filled 2022-11-12: qty 30, 30d supply, fill #0

## 2022-11-12 MED ORDER — CLONIDINE HCL 0.1 MG PO TABS
0.1000 mg | ORAL_TABLET | Freq: Every day | ORAL | 0 refills | Status: DC
  Filled 2022-11-12: qty 30, 30d supply, fill #0

## 2022-11-21 ENCOUNTER — Ambulatory Visit: Payer: 59 | Attending: Internal Medicine | Admitting: Internal Medicine

## 2022-11-21 ENCOUNTER — Other Ambulatory Visit (HOSPITAL_COMMUNITY): Payer: Self-pay

## 2022-11-21 ENCOUNTER — Encounter: Payer: Self-pay | Admitting: Internal Medicine

## 2022-11-21 VITALS — BP 120/68 | HR 58 | Ht 68.0 in | Wt 204.4 lb

## 2022-11-21 DIAGNOSIS — I491 Atrial premature depolarization: Secondary | ICD-10-CM | POA: Diagnosis not present

## 2022-11-21 DIAGNOSIS — I517 Cardiomegaly: Secondary | ICD-10-CM | POA: Diagnosis not present

## 2022-11-21 DIAGNOSIS — I484 Atypical atrial flutter: Secondary | ICD-10-CM

## 2022-11-21 MED ORDER — METOPROLOL TARTRATE 25 MG PO TABS: 25.0000 mg | ORAL_TABLET | Freq: Two times a day (BID) | ORAL | 3 refills | Status: DC

## 2022-11-21 NOTE — Patient Instructions (Signed)
Medication Instructions:  Your physician has recommended you make the following change in your medication:   ** Begin taking Metoprolol Tartrate '25mg'$  - 1 tablet by mouth twice daily.  *If you need a refill on your cardiac medications before your next appointment, please call your pharmacy*   Lab Work: Complete labs with PCP  If you have labs (blood work) drawn today and your tests are completely normal, you will receive your results only by: [place] (if you have MyChart) OR A paper copy in the mail If you have any lab test that is abnormal or we need to change your treatment, we will call you to review the results.   Testing/Procedures: None ordered.    Follow-Up: At [facility], you and your health needs are our priority.  As part of our continuing mission to provide you with exceptional heart care, we have created designated Provider Care Teams.  These Care Teams include your primary Cardiologist (physician) and Advanced Practice Providers (APPs -  Physician Assistants and Nurse Practitioners) who all work together to provide you with the care you need, when you need it.  We recommend signing up for the patient portal called "MyChart".  Sign up information is provided on this After Visit Summary.  MyChart is used to connect with patients for Virtual Visits (Telemedicine).  Patients are able to view lab/test results, encounter notes, upcoming appointments, etc.  Non-urgent messages can be sent to your provider as well.   To learn more about what you can do with MyChart, go to [email or web address].    Your next appointment:   12 months  Important Information About Sugar

## 2022-11-21 NOTE — Progress Notes (Signed)
.    A serie  Patient Care Team: Patient, No Pcp Per as PCP - General (General Practice)   HPI  [name] is a 61 y.o. male Seen in followup for concerns about atrial fibrillation in the context of significant left atrial enlargement prior atrial flutter for which he underwent catheter ablation 2013 and left ventricular hypertrophy.  Presumed HCM.  Spoke with Dr. Broadus John.  Has deferred genetic testing.  Ischemic heart disease with prior bypass surgery 4/13 and had post CABG atrial fibrillation.   Atypical and typical flutter but amplitudes particularly in lead V1 which are very atypical.  He saw Dr. Greggory Brandy and underwent catheter ablation for atrial flutter as well as atrial fibrillation.  9/18.. Atrial flutter was noninducible.  Pulmonary vein isolation was accomplished.  Multiple PACs.  Further repeat ablation was not recommended   Now maintained on  Amio;as undergone interval ablation by Dr Jefm Miles.   But noted no interval benefit in his patient experience overall was really quite negative.  At our last visit 3/22 discussed alternative referrals, [place], [place], [place] [facility].  Maintained on amio  Patient denies symptoms of GI intolerance, sun sensitivity, neurological symptoms attributable to amiodarone.   The patient denies chest pain, shortness of breath, nocturnal dyspnea, orthopnea or peripheral edema.  There have been no palpitations, lightheadedness or synciope  Doing amazingly well on his amiodarone.  Lots going on in recent months.  His mother passed away.  He has had some contact with his birth mother.  A good friend committed suicide.Marland Kitchen   Has hx of remote stroke      DATE TEST EF   9/15 Echo   55-65 % LVH severe LAE (53/2.4)  7/16 cMRI   ASH Mid--apical  cavitary obliteration  No contrast 2/2 renal disease  9/18 TEE 65% HCM   1/22 Echo  70-75%    Date Cr K TSH LFTs Hgb  9/17 1.5 4.6      10/19 1.5 4.1   17  5/20 1.26 4.7   16.1  3/21 1.56 4.6 1.12    2/22 1.73 4.8 2.04  27   9/22   1.01    12/23 1.67 4.0          He has resting bradycardia  Past Medical History:  Diagnosis Date   Arteriovenous fistula, acquired ([place])    Atrial fibrillation and flutter ([place])    Bladder stones    BMI 31.0-31.9,adult    CAD (coronary artery disease)    a. s/p CABG 03/2012: LIMA to LAD, free RIMA to OM1, SVG to D1, sequential SVG to AM and LPLB2, EVH via right thigh and leg.   Cellulitis 04/13/2012   RLE saphenous vein harvest incision    CKD (chronic kidney disease)    Dyslipidemia    Gout    History of kidney transplant    Hyperglycemia    Hyperlipidemia    Hyperparathyroidism    Hypertension    Hypertrophic cardiomyopathy (HCC)    Hypomagnesemia    Immunosuppression (HCC)    Intermittent palpitations    Microscopic hematuria    Migraines    PAF and Flutter    a. Afib post-op CABG; AFlutter 10/2012   Peripheral vascular disease ([place])    Post-transplant erythrocytosis    Renal disease    S/P living-donor kidney transplantation    Sinus node dysfunction-post termination pause    a. >8sec in setting of aflutter 10/2012   Sleep pattern disturbance    Stroke [facility])  1995; 2001   denies residual   Superficial vein thrombosis    a. R greater saphenous 04/2012   Vitamin D deficiency    VT (ventricular tachycardia) ([place])     Past Surgical History:  Procedure Laterality Date   ATRIAL FIBRILLATION ABLATION N/A 08/21/2017   Procedure: Atrial Fibrillation Ablation;  Surgeon: [name], MD;  Location: [facility] CV LAB;  Service: Cardiovascular;  Laterality: N/A;   ATRIAL FLUTTER ABLATION N/A 11/09/2012   Procedure: ATRIAL FLUTTER ABLATION;  Surgeon: [name], MD;  Location: [facility] CATH LAB;  Service: Cardiovascular;  Laterality: N/A;   AV FISTULA PLACEMENT  2011   left forearm   AV FISTULA PLACEMENT     CARDIAC CATHETERIZATION  03/16/12   CARDIOVERSION N/A 06/10/2017   Procedure: CARDIOVERSION;  Surgeon: Pixie Casino, MD;  Location: [facility] ENDOSCOPY;   Service: Cardiovascular;  Laterality: N/A;   CARDIOVERSION N/A 10/13/2017   Procedure: CARDIOVERSION;  Surgeon: Dorothy Spark, MD;  Location: [facility] ENDOSCOPY;  Service: Cardiovascular;  Laterality: N/A;   CARDIOVERSION N/A 10/08/2018   Procedure: CARDIOVERSION;  Surgeon: Larey Dresser, MD;  Location: [facility] ENDOSCOPY;  Service: Cardiovascular;  Laterality: N/A;   CARDIOVERSION N/A 04/27/2019   Procedure: CARDIOVERSION;  Surgeon: Dorothy Spark, MD;  Location: [facility] ENDOSCOPY;  Service: Cardiovascular;  Laterality: N/A;   CARDIOVERSION N/A 02/24/2020   Procedure: CARDIOVERSION;  Surgeon: Donato Heinz, MD;  Location: [facility];  Service: Cardiovascular;  Laterality: N/A;   CORONARY ARTERY BYPASS GRAFT  03/19/2012   Procedure: CORONARY ARTERY BYPASS GRAFTING (CABG);  Surgeon: Rexene Alberts, MD;  Location: [facility];  Service: Open Heart Surgery;  Laterality: N/A;  (B) MAMMARY   CYSTOSCOPY WITH BIOPSY N/A 08/15/2016   Procedure: CYSTOSCOPY WITH IDENTIFICATION OF RIGHT TRANSPLANTATION OF URETRAL ORFICE WITH FLUORESCEIN;  Surgeon: Carolan Clines, MD;  Location: [facility] ORS;  Service: Urology;  Laterality: N/A;   DENTAL SURGERY  2008   multiple   ELBOW FRACTURE SURGERY  1972   left   FRACTURE SURGERY     HERNIA REPAIR  [id number]/5885   umbilical   HOLMIUM LASER APPLICATION N/A 0/27/7412   Procedure: HOLMIUM LASER APPLICATION WITH 3 CM RIGHT BLADDER STONE;  Surgeon: Carolan Clines, MD;  Location: [facility] ORS;  Service: Urology;  Laterality: N/A;   INGUINAL HERNIA REPAIR  09/2009   bilaterally   KIDNEY TRANSPLANT     LEFT HEART CATH AND CORS/GRAFTS ANGIOGRAPHY N/A 07/27/2019   Procedure: LEFT HEART CATH AND CORS/GRAFTS ANGIOGRAPHY;  Surgeon: Sherren Mocha, MD;  Location: [place] CV LAB;  Service: Cardiovascular;  Laterality: N/A;   LEFT HEART CATHETERIZATION WITH CORONARY ANGIOGRAM N/A 03/16/2012   Procedure: LEFT HEART CATHETERIZATION WITH CORONARY ANGIOGRAM;  Surgeon: Sherren Mocha, MD;   Location: [facility] CATH LAB;  Service: Cardiovascular;  Laterality: N/A;   RENAL ARTERY STENT  2001   TEE WITHOUT CARDIOVERSION N/A 08/21/2017   Procedure: TRANSESOPHAGEAL ECHOCARDIOGRAM (TEE);  Surgeon: Larey Dresser, MD;  Location: [facility] ENDOSCOPY;  Service: Cardiovascular;  Laterality: N/A;   TRANSURETHRAL RESECTION OF BLADDER TUMOR N/A 08/15/2016   Procedure: TRANSURETHRAL BIOPSY OF RIGHT BLADDER WALL;  Surgeon: Carolan Clines, MD;  Location: [facility] OR[facility];  Service: Urology;  Laterality: N/A;     Allergies  Allergen Reactions   Iodinated Contrast Media Other (See Comments) and Shortness Of Breath    Pt states that this medication causes him to code.  Respiratory arrest   Penicillins Itching, Rash and Other (See Comments)    Has patient had  a PCN reaction causing immediate rash, facial/tongue/throat swelling, SOB or lightheadedness with hypotension: No Has patient had a PCN reaction causing severe rash involving mucus membranes or skin necrosis: No Has patient had a PCN reaction that required hospitalization No Has patient had a PCN reaction occurring within the last 10 years: No If all of the above answers are "NO", then may proceed with Cephalosporin use.   Lisinopril     Increased Cr   Current Meds  Medication Sig   allopurinol (ZYLOPRIM) 100 MG tablet Take 2 tablets (200 mg total) by mouth daily.   amiodarone (PACERONE) 200 MG tablet Take 1 tablet (200 mg total) by mouth daily.   apixaban (ELIQUIS) 5 MG TABS tablet Take 1 tablet (5 mg total) by mouth 2 (two) times daily.   cloNIDine (CATAPRES) 0.1 MG tablet Take 1 tablet (0.1 mg total) by mouth daily.   colchicine 0.6 MG tablet Take 1 tablet (0.6 mg total) by mouth daily as needed. Max 1 tablet daily   fenofibrate micronized (LOFIBRA) 134 MG capsule Take 1 capsule (134 mg total) by mouth daily.   fluticasone (FLONASE) 50 MCG/ACT nasal spray Place 1 spray into both nostrils as needed for allergies or rhinitis.   furosemide (LASIX) 40 MG  tablet Take 1 tablet (40 mg total) by mouth daily as needed.   icosapent Ethyl (VASCEPA) 1 g capsule Take 2 capsules (2 g total) by mouth 2 (two) times daily.   losartan (COZAAR) 25 MG tablet Take 1 tablet (25 mg total) by mouth daily.   magnesium oxide (MAG-OX) 400 (241.3 Mg) MG tablet Take 400 mg by mouth 2 (two) times daily.   metoprolol tartrate (LOPRESSOR) 25 MG tablet Take 1 tablet (25 mg total) by mouth at bedtime.   mycophenolate (MYFORTIC) 180 MG EC tablet Take 4 tablets (720 mg total) by mouth 2 (two) times daily.   omeprazole (PRILOSEC) 20 MG capsule Take 1 capsule (20 mg total) by mouth daily.   predniSONE (DELTASONE) 5 MG tablet Take 1 tablet (5 mg total) by mouth daily.   rosuvastatin (CRESTOR) 20 MG tablet Take 1 tablet (20 mg total) by mouth daily.   sulfamethoxazole-trimethoprim (BACTRIM) 400-80 MG tablet Take 1 tablet by mouth 3 (three) times a week.)on Mondays, Wednesdays & Fridays)   tacrolimus (PROGRAF) 0.5 MG capsule Take 1 capsule (0.5 mg total) by mouth every evening.   tacrolimus (PROGRAF) 1 MG capsule Take 1 capsule (1 mg total) by mouth every morning.   tadalafil (CIALIS) 5 MG tablet Take 1 tablet (5 mg total) by mouth daily.      Review of Systems negative except from HPI and PMH  BP 120/68   Pulse (!) 58   Ht '5\' 8"'$  (1.727 m)   Wt 204 lb 6.4 oz (92.7 kg)   SpO2 99%   BMI 31.08 kg/m  Well developed and well nourished in no acute distress HENT normal Neck supple with JVP-flat Clear Device pocket well healed; without hematoma or erythema.  There is no tethering  Regular rate and rhythm, no  gallop No murmur Abd-soft with active BS No Clubbing cyanosis  edema Skin-warm and dry A & Oriented  Grossly normal sensory and motor function  ECG       Assessment and  Plan   Atrial Flutter  S/p RFCA-- recurrent and atypical s/p ablation 2018-JA   Freq PACs   LAE   LVH-Asymmetric 18/11  HCM    CAD s/p CABG 2013   Sleep disordered breathing  Sinus  bradycardia   Status post kidney transplant      Continue amio, need amio surviellance labs  Continue Apixaban  for afib  Will take metoprolol 25 bid and not once a day, suggested ( and reached out to Dr Marval Regal) that he try and simplify his BP regime ( taking low doses of losartan, metop and clonidine ( also once a day))  Without symptoms of ischemia.  Continue rasovostatin, Apixaban    needs lipid profile

## 2022-11-27 ENCOUNTER — Other Ambulatory Visit: Payer: Self-pay | Admitting: Internal Medicine

## 2022-11-27 ENCOUNTER — Other Ambulatory Visit: Payer: Self-pay

## 2022-11-27 ENCOUNTER — Other Ambulatory Visit (HOSPITAL_COMMUNITY): Payer: Self-pay

## 2022-11-27 ENCOUNTER — Other Ambulatory Visit: Payer: Self-pay | Admitting: Urology

## 2022-11-27 DIAGNOSIS — R3129 Other microscopic hematuria: Secondary | ICD-10-CM

## 2022-11-27 MED ORDER — METOPROLOL TARTRATE 25 MG PO TABS
25.0000 mg | ORAL_TABLET | Freq: Two times a day (BID) | ORAL | 3 refills | Status: DC
  Filled 2022-11-27: qty 180, 90d supply, fill #0
  Filled 2023-02-27: qty 180, 90d supply, fill #1
  Filled 2023-06-04: qty 180, 90d supply, fill #2
  Filled 2023-09-12: qty 180, 90d supply, fill #3

## 2022-11-27 MED ORDER — METOPROLOL TARTRATE 25 MG PO TABS
25.0000 mg | ORAL_TABLET | Freq: Every day | ORAL | 3 refills | Status: DC
  Filled 2022-11-27: qty 90, 90d supply, fill #0

## 2022-11-27 MED ORDER — METOPROLOL TARTRATE 25 MG PO TABS: 25.0000 mg | ORAL_TABLET | Freq: Two times a day (BID) | ORAL | 3 refills | Status: DC

## 2022-11-27 NOTE — Addendum Note (Signed)
Addended by: Vashti Hey on: 11/27/2022 01:49 PM   Modules accepted: Orders

## 2022-11-28 ENCOUNTER — Other Ambulatory Visit (HOSPITAL_COMMUNITY): Payer: Self-pay

## 2022-11-28 ENCOUNTER — Ambulatory Visit (INDEPENDENT_AMBULATORY_CARE_PROVIDER_SITE_OTHER): Payer: 59

## 2022-11-28 ENCOUNTER — Other Ambulatory Visit: Payer: Self-pay

## 2022-11-28 DIAGNOSIS — R3129 Other microscopic hematuria: Secondary | ICD-10-CM | POA: Diagnosis not present

## 2022-11-28 DIAGNOSIS — N281 Cyst of kidney, acquired: Secondary | ICD-10-CM | POA: Diagnosis not present

## 2022-11-28 DIAGNOSIS — K5730 Diverticulosis of large intestine without perforation or abscess without bleeding: Secondary | ICD-10-CM | POA: Diagnosis not present

## 2022-12-03 ENCOUNTER — Other Ambulatory Visit (HOSPITAL_COMMUNITY): Payer: Self-pay

## 2022-12-06 ENCOUNTER — Other Ambulatory Visit (HOSPITAL_COMMUNITY): Payer: Self-pay

## 2022-12-09 DIAGNOSIS — M109 Gout, unspecified: Secondary | ICD-10-CM | POA: Diagnosis not present

## 2022-12-09 DIAGNOSIS — E785 Hyperlipidemia, unspecified: Secondary | ICD-10-CM | POA: Diagnosis not present

## 2022-12-09 DIAGNOSIS — I2510 Atherosclerotic heart disease of native coronary artery without angina pectoris: Secondary | ICD-10-CM | POA: Diagnosis not present

## 2022-12-09 DIAGNOSIS — I4892 Unspecified atrial flutter: Secondary | ICD-10-CM | POA: Diagnosis not present

## 2022-12-09 DIAGNOSIS — I4891 Unspecified atrial fibrillation: Secondary | ICD-10-CM | POA: Diagnosis not present

## 2022-12-09 DIAGNOSIS — I129 Hypertensive chronic kidney disease with stage 1 through stage 4 chronic kidney disease, or unspecified chronic kidney disease: Secondary | ICD-10-CM | POA: Diagnosis not present

## 2022-12-09 DIAGNOSIS — Z940 Kidney transplant status: Secondary | ICD-10-CM | POA: Diagnosis not present

## 2022-12-09 DIAGNOSIS — D751 Secondary polycythemia: Secondary | ICD-10-CM | POA: Diagnosis not present

## 2022-12-09 DIAGNOSIS — N400 Enlarged prostate without lower urinary tract symptoms: Secondary | ICD-10-CM | POA: Diagnosis not present

## 2022-12-10 DIAGNOSIS — L820 Inflamed seborrheic keratosis: Secondary | ICD-10-CM | POA: Diagnosis not present

## 2022-12-10 DIAGNOSIS — L738 Other specified follicular disorders: Secondary | ICD-10-CM | POA: Diagnosis not present

## 2022-12-11 ENCOUNTER — Other Ambulatory Visit: Payer: Self-pay | Admitting: Internal Medicine

## 2022-12-11 DIAGNOSIS — Z940 Kidney transplant status: Secondary | ICD-10-CM | POA: Diagnosis not present

## 2022-12-12 ENCOUNTER — Other Ambulatory Visit: Payer: Self-pay

## 2022-12-12 ENCOUNTER — Other Ambulatory Visit (HOSPITAL_COMMUNITY): Payer: Self-pay

## 2022-12-12 ENCOUNTER — Telehealth: Payer: Self-pay

## 2022-12-12 DIAGNOSIS — I2510 Atherosclerotic heart disease of native coronary artery without angina pectoris: Secondary | ICD-10-CM

## 2022-12-12 MED ORDER — CLONIDINE HCL 0.1 MG PO TABS
0.1000 mg | ORAL_TABLET | Freq: Every day | ORAL | 3 refills | Status: DC
  Filled 2022-12-12: qty 90, 90d supply, fill #0
  Filled 2023-03-13: qty 90, 90d supply, fill #1
  Filled 2023-06-06: qty 90, 90d supply, fill #2
  Filled 2023-09-12: qty 90, 90d supply, fill #3

## 2022-12-12 MED ORDER — ROSUVASTATIN CALCIUM 20 MG PO TABS
20.0000 mg | ORAL_TABLET | Freq: Every day | ORAL | 3 refills | Status: DC
  Filled 2022-12-12: qty 90, 90d supply, fill #0
  Filled 2023-03-13: qty 90, 90d supply, fill #1
  Filled 2023-06-06: qty 90, 90d supply, fill #2
  Filled 2023-09-12: qty 90, 90d supply, fill #3

## 2022-12-12 MED ORDER — AMIODARONE HCL 200 MG PO TABS
200.0000 mg | ORAL_TABLET | Freq: Every day | ORAL | 3 refills | Status: DC
  Filled 2022-12-12: qty 90, 90d supply, fill #0
  Filled 2023-03-13: qty 90, 90d supply, fill #1
  Filled 2023-06-06: qty 90, 90d supply, fill #2
  Filled 2023-09-12: qty 90, 90d supply, fill #3

## 2022-12-12 NOTE — Telephone Encounter (Signed)
Spoke with pt and advised per Dr Caryl Comes pt will need fasting lipid panel. Pt verbalizes understanding and will contact office next week to schedule appointment.  Lab order has been placed.   [name], MD  Thora Lance, RN [facility] can we get a lipid profile please Thanks SK

## 2022-12-19 DIAGNOSIS — N400 Enlarged prostate without lower urinary tract symptoms: Secondary | ICD-10-CM | POA: Diagnosis not present

## 2022-12-26 ENCOUNTER — Other Ambulatory Visit (HOSPITAL_COMMUNITY): Payer: Self-pay

## 2022-12-26 DIAGNOSIS — N400 Enlarged prostate without lower urinary tract symptoms: Secondary | ICD-10-CM | POA: Diagnosis not present

## 2022-12-26 DIAGNOSIS — R3129 Other microscopic hematuria: Secondary | ICD-10-CM | POA: Diagnosis not present

## 2022-12-26 MED ORDER — TADALAFIL 5 MG PO TABS
5.0000 mg | ORAL_TABLET | Freq: Every day | ORAL | 3 refills | Status: DC
  Filled 2022-12-26: qty 90, 90d supply, fill #0
  Filled 2023-03-29: qty 90, 90d supply, fill #1
  Filled 2023-06-30: qty 90, 90d supply, fill #2
  Filled 2023-09-30: qty 90, 90d supply, fill #3

## 2022-12-31 ENCOUNTER — Other Ambulatory Visit (HOSPITAL_COMMUNITY): Payer: Self-pay

## 2023-01-06 ENCOUNTER — Other Ambulatory Visit (HOSPITAL_COMMUNITY): Payer: Self-pay

## 2023-01-07 ENCOUNTER — Ambulatory Visit: Payer: 59 | Attending: Internal Medicine

## 2023-01-07 DIAGNOSIS — I2510 Atherosclerotic heart disease of native coronary artery without angina pectoris: Secondary | ICD-10-CM

## 2023-01-08 ENCOUNTER — Other Ambulatory Visit (HOSPITAL_COMMUNITY): Payer: Self-pay

## 2023-01-08 LAB — LIPID PANEL
Chol/HDL Ratio: 5.8 ratio — ABNORMAL HIGH (ref 0.0–5.0)
Cholesterol, Total: 208 mg/dL — ABNORMAL HIGH (ref 100–199)
HDL: 36 mg/dL — ABNORMAL LOW (ref >39)
LDL Chol Calc (NIH): 145 mg/dL — ABNORMAL HIGH (ref 0–99)
Triglycerides: 149 mg/dL (ref 0–149)
VLDL Cholesterol Cal: 27 mg/dL (ref 5–40)

## 2023-01-09 ENCOUNTER — Encounter (HOSPITAL_COMMUNITY): Payer: Self-pay | Admitting: *Deleted

## 2023-01-09 ENCOUNTER — Other Ambulatory Visit (HOSPITAL_COMMUNITY): Payer: Self-pay

## 2023-01-09 ENCOUNTER — Other Ambulatory Visit: Payer: Self-pay

## 2023-01-09 MED ORDER — ELIQUIS 5 MG PO TABS
5.0000 mg | ORAL_TABLET | Freq: Two times a day (BID) | ORAL | 5 refills | Status: DC
  Filled 2023-01-09: qty 60, 30d supply, fill #0
  Filled 2023-02-06: qty 60, 30d supply, fill #1
  Filled 2023-03-13: qty 60, 30d supply, fill #2
  Filled 2023-04-09: qty 60, 30d supply, fill #3
  Filled 2023-05-08: qty 60, 30d supply, fill #4
  Filled 2023-06-06: qty 60, 30d supply, fill #5

## 2023-01-10 ENCOUNTER — Other Ambulatory Visit (HOSPITAL_COMMUNITY): Payer: Self-pay

## 2023-01-30 ENCOUNTER — Other Ambulatory Visit (HOSPITAL_COMMUNITY): Payer: Self-pay

## 2023-01-30 MED ORDER — TACROLIMUS 1 MG PO CAPS
1.0000 mg | ORAL_CAPSULE | Freq: Every morning | ORAL | 5 refills | Status: DC
  Filled 2023-01-30: qty 30, 30d supply, fill #0
  Filled 2023-02-27: qty 30, 30d supply, fill #1
  Filled 2023-03-29: qty 30, 30d supply, fill #2
  Filled 2023-04-29: qty 30, 30d supply, fill #3
  Filled 2023-06-04: qty 30, 30d supply, fill #4
  Filled 2023-06-30: qty 30, 30d supply, fill #5

## 2023-01-30 MED ORDER — TACROLIMUS 0.5 MG PO CAPS
0.5000 mg | ORAL_CAPSULE | Freq: Every evening | ORAL | 5 refills | Status: DC
  Filled 2023-01-30 – 2023-02-06 (×2): qty 30, 30d supply, fill #0
  Filled 2023-03-13: qty 30, 30d supply, fill #1
  Filled 2023-04-09: qty 30, 30d supply, fill #2
  Filled 2023-05-08: qty 30, 30d supply, fill #3
  Filled 2023-06-06: qty 30, 30d supply, fill #4
  Filled 2023-07-07 – 2023-07-11 (×2): qty 30, 30d supply, fill #5

## 2023-01-31 ENCOUNTER — Other Ambulatory Visit: Payer: Self-pay

## 2023-02-04 ENCOUNTER — Other Ambulatory Visit (HOSPITAL_COMMUNITY): Payer: Self-pay

## 2023-02-06 ENCOUNTER — Other Ambulatory Visit (HOSPITAL_COMMUNITY): Payer: Self-pay

## 2023-02-07 ENCOUNTER — Other Ambulatory Visit (HOSPITAL_COMMUNITY): Payer: Self-pay

## 2023-02-07 ENCOUNTER — Other Ambulatory Visit: Payer: Self-pay

## 2023-02-07 ENCOUNTER — Other Ambulatory Visit (HOSPITAL_BASED_OUTPATIENT_CLINIC_OR_DEPARTMENT_OTHER): Payer: Self-pay

## 2023-02-07 MED ORDER — MYCOPHENOLATE SODIUM 180 MG PO TBEC
720.0000 mg | DELAYED_RELEASE_TABLET | Freq: Two times a day (BID) | ORAL | 5 refills | Status: DC
  Filled 2023-02-07: qty 240, 30d supply, fill #0
  Filled 2023-03-13: qty 240, 30d supply, fill #1
  Filled 2023-04-09: qty 240, 30d supply, fill #2
  Filled 2023-05-08: qty 240, 30d supply, fill #3
  Filled 2023-06-06: qty 240, 30d supply, fill #4
  Filled 2023-07-07 – 2023-07-11 (×2): qty 240, 30d supply, fill #5

## 2023-02-07 MED ORDER — OMEPRAZOLE 20 MG PO CPDR
20.0000 mg | DELAYED_RELEASE_CAPSULE | Freq: Every day | ORAL | 5 refills | Status: DC
  Filled 2023-02-07: qty 30, 30d supply, fill #0
  Filled 2023-03-13: qty 30, 30d supply, fill #1
  Filled 2023-04-09: qty 30, 30d supply, fill #2
  Filled 2023-05-08: qty 30, 30d supply, fill #3
  Filled 2023-06-06: qty 30, 30d supply, fill #4
  Filled 2023-07-07: qty 30, 30d supply, fill #5

## 2023-02-07 MED ORDER — FENOFIBRATE MICRONIZED 134 MG PO CAPS
134.0000 mg | ORAL_CAPSULE | Freq: Every day | ORAL | 5 refills | Status: DC
  Filled 2023-02-07: qty 30, 30d supply, fill #0
  Filled 2023-03-13: qty 30, 30d supply, fill #1
  Filled 2023-04-09: qty 30, 30d supply, fill #2
  Filled 2023-05-08: qty 30, 30d supply, fill #3
  Filled 2023-06-06: qty 30, 30d supply, fill #4
  Filled 2023-07-07: qty 30, 30d supply, fill #5

## 2023-02-27 ENCOUNTER — Other Ambulatory Visit (HOSPITAL_COMMUNITY): Payer: Self-pay

## 2023-02-27 ENCOUNTER — Other Ambulatory Visit: Payer: Self-pay

## 2023-02-27 MED ORDER — SULFAMETHOXAZOLE-TRIMETHOPRIM 400-80 MG PO TABS
1.0000 | ORAL_TABLET | ORAL | 5 refills | Status: DC
  Filled 2023-02-27: qty 12, 28d supply, fill #0
  Filled 2023-03-29: qty 12, 28d supply, fill #1
  Filled 2023-04-29: qty 12, 28d supply, fill #2
  Filled 2023-06-04: qty 12, 28d supply, fill #3
  Filled 2023-06-30: qty 12, 28d supply, fill #4
  Filled 2023-07-24: qty 12, 28d supply, fill #5

## 2023-03-13 ENCOUNTER — Other Ambulatory Visit (HOSPITAL_COMMUNITY): Payer: Self-pay

## 2023-03-31 ENCOUNTER — Other Ambulatory Visit (HOSPITAL_COMMUNITY): Payer: Self-pay

## 2023-04-07 ENCOUNTER — Other Ambulatory Visit (HOSPITAL_COMMUNITY): Payer: Self-pay

## 2023-04-07 MED ORDER — METHOCARBAMOL 500 MG PO TABS
500.0000 mg | ORAL_TABLET | Freq: Three times a day (TID) | ORAL | 2 refills | Status: AC | PRN
  Filled 2023-04-07: qty 30, 10d supply, fill #0
  Filled 2023-04-29: qty 30, 10d supply, fill #1
  Filled 2023-06-04: qty 30, 10d supply, fill #2

## 2023-04-09 ENCOUNTER — Other Ambulatory Visit (HOSPITAL_COMMUNITY): Payer: Self-pay

## 2023-04-09 ENCOUNTER — Other Ambulatory Visit: Payer: Self-pay

## 2023-04-09 MED ORDER — ICOSAPENT ETHYL 1 G PO CAPS
2.0000 g | ORAL_CAPSULE | Freq: Two times a day (BID) | ORAL | 5 refills | Status: DC
  Filled 2023-04-09: qty 120, 30d supply, fill #0
  Filled 2023-05-08: qty 120, 30d supply, fill #1
  Filled 2023-06-06: qty 120, 30d supply, fill #2
  Filled 2023-07-07: qty 120, 30d supply, fill #3
  Filled 2023-08-12: qty 120, 30d supply, fill #4
  Filled 2023-09-18: qty 120, 30d supply, fill #5

## 2023-04-09 MED ORDER — ALLOPURINOL 100 MG PO TABS
200.0000 mg | ORAL_TABLET | Freq: Every day | ORAL | 5 refills | Status: DC
  Filled 2023-04-09: qty 60, 30d supply, fill #0
  Filled 2023-05-08: qty 60, 30d supply, fill #1
  Filled 2023-06-06: qty 60, 30d supply, fill #2
  Filled 2023-07-07: qty 60, 30d supply, fill #3
  Filled 2023-08-12: qty 60, 30d supply, fill #4
  Filled 2023-09-18: qty 60, 30d supply, fill #5

## 2023-04-09 MED ORDER — LOSARTAN POTASSIUM 25 MG PO TABS
25.0000 mg | ORAL_TABLET | Freq: Every day | ORAL | 5 refills | Status: DC
  Filled 2023-04-09: qty 30, 30d supply, fill #0
  Filled 2023-05-08: qty 30, 30d supply, fill #1
  Filled 2023-06-06: qty 30, 30d supply, fill #2
  Filled 2023-07-07: qty 30, 30d supply, fill #3
  Filled 2023-08-12: qty 30, 30d supply, fill #4
  Filled 2023-09-18: qty 30, 30d supply, fill #5

## 2023-04-14 DIAGNOSIS — L821 Other seborrheic keratosis: Secondary | ICD-10-CM | POA: Diagnosis not present

## 2023-04-14 DIAGNOSIS — L820 Inflamed seborrheic keratosis: Secondary | ICD-10-CM | POA: Diagnosis not present

## 2023-05-09 ENCOUNTER — Other Ambulatory Visit (HOSPITAL_COMMUNITY): Payer: Self-pay

## 2023-05-15 ENCOUNTER — Other Ambulatory Visit (HOSPITAL_COMMUNITY): Payer: Self-pay

## 2023-05-16 ENCOUNTER — Other Ambulatory Visit (HOSPITAL_COMMUNITY): Payer: Self-pay

## 2023-05-16 MED ORDER — PREDNISONE 5 MG PO TABS
5.0000 mg | ORAL_TABLET | Freq: Every day | ORAL | 5 refills | Status: DC
  Filled 2023-05-16: qty 30, 30d supply, fill #0
  Filled 2023-06-16: qty 30, 30d supply, fill #1
  Filled 2023-07-24: qty 30, 30d supply, fill #2
  Filled 2023-08-19: qty 30, 30d supply, fill #3
  Filled 2023-09-18: qty 30, 30d supply, fill #4
  Filled 2023-10-20: qty 30, 30d supply, fill #5

## 2023-05-21 ENCOUNTER — Other Ambulatory Visit (HOSPITAL_COMMUNITY): Payer: Self-pay

## 2023-05-28 DIAGNOSIS — H6123 Impacted cerumen, bilateral: Secondary | ICD-10-CM | POA: Diagnosis not present

## 2023-05-28 DIAGNOSIS — H903 Sensorineural hearing loss, bilateral: Secondary | ICD-10-CM | POA: Diagnosis not present

## 2023-06-06 ENCOUNTER — Other Ambulatory Visit (HOSPITAL_COMMUNITY): Payer: Self-pay

## 2023-06-26 ENCOUNTER — Telehealth: Payer: 59 | Admitting: Physician Assistant

## 2023-06-26 DIAGNOSIS — H1033 Unspecified acute conjunctivitis, bilateral: Secondary | ICD-10-CM | POA: Diagnosis not present

## 2023-06-26 MED ORDER — DOXYCYCLINE HYCLATE 100 MG PO TABS: 100.0000 mg | ORAL_TABLET | Freq: Two times a day (BID) | ORAL | 0 refills | Status: DC

## 2023-06-26 NOTE — Progress Notes (Signed)
I have spent 5 minutes in review of e-visit questionnaire, review and updating patient chart, medical decision making and response to patient.   William Cody Martin, PA-C    

## 2023-06-26 NOTE — Progress Notes (Signed)
E-Visit for [employer]   We are sorry that you are not feeling well.  Here is how we plan to help!  Based on what you have shared with me it looks like you have conjunctivitis.  Conjunctivitis is a common inflammatory or infectious condition of the eye that is often referred to as "pink eye".  In most cases it is contagious (viral or bacterial). However, not all conjunctivitis requires antibiotics (ex. Allergic).  We have made appropriate suggestions for you based upon your presentation.  I have added on oral Doxycycline to take as directed.   Pink eye can be highly contagious.  It is typically spread through direct contact with secretions, or contaminated objects or surfaces that one may have touched.  Strict handwashing is suggested with soap and water is urged.  If not available, use alcohol based had sanitizer.  Avoid unnecessary touching of the eye.  If you wear contact lenses, you will need to refrain from wearing them until you see no white discharge from the eye for at least 24 hours after being on medication.  You should see symptom improvement in 1-2 days after starting the medication regimen.  Call us if symptoms are not improved in 1-2 days.  Home Care: Wash your hands often! Do not wear your contacts until you complete your treatment plan. Avoid sharing towels, bed linen, personal items with a person who has pink eye. See attention for anyone in your home with similar symptoms.  Get Help Right Away If: Your symptoms do not improve. You develop blurred or loss of vision. Your symptoms worsen (increased discharge, pain or redness)   Thank you for choosing an e-visit.  Your e-visit answers were reviewed by a board certified advanced clinical practitioner to complete your personal care plan. Depending upon the condition, your plan could have included both over the counter or prescription medications.  Please review your pharmacy choice. Make sure the pharmacy is open so you can pick  up prescription now. If there is a problem, you may contact your provider through [employer] and have the prescription routed to another pharmacy.  Your safety is important to [place]. If you have drug allergies check your prescription carefully.   For the next 24 hours you can use MyChart to ask questions about today's visit, request a non-urgent call back, or ask for a work or school excuse. You will get an email in the next two days asking about your experience. I hope that your e-visit has been valuable and will speed your recovery.

## 2023-06-26 NOTE — Progress Notes (Signed)
Message sent to patient requesting further input regarding current symptoms. Awaiting patient response.  

## 2023-07-01 ENCOUNTER — Other Ambulatory Visit (HOSPITAL_COMMUNITY): Payer: Self-pay

## 2023-07-07 ENCOUNTER — Other Ambulatory Visit (HOSPITAL_COMMUNITY): Payer: Self-pay

## 2023-07-08 ENCOUNTER — Other Ambulatory Visit (HOSPITAL_COMMUNITY): Payer: Self-pay

## 2023-07-08 MED ORDER — APIXABAN 5 MG PO TABS
5.0000 mg | ORAL_TABLET | Freq: Two times a day (BID) | ORAL | 5 refills | Status: DC
  Filled 2023-07-08: qty 60, 30d supply, fill #0
  Filled 2023-08-12: qty 60, 30d supply, fill #1
  Filled 2023-09-18: qty 60, 30d supply, fill #2
  Filled 2023-10-14: qty 60, 30d supply, fill #3
  Filled 2023-11-11: qty 60, 30d supply, fill #4
  Filled 2023-12-16: qty 60, 30d supply, fill #5

## 2023-07-11 ENCOUNTER — Other Ambulatory Visit (HOSPITAL_COMMUNITY): Payer: Self-pay

## 2023-07-24 ENCOUNTER — Other Ambulatory Visit (HOSPITAL_COMMUNITY): Payer: Self-pay

## 2023-07-25 ENCOUNTER — Other Ambulatory Visit (HOSPITAL_COMMUNITY): Payer: Self-pay

## 2023-07-25 ENCOUNTER — Other Ambulatory Visit: Payer: Self-pay

## 2023-07-25 MED ORDER — TACROLIMUS 1 MG PO CAPS
1.0000 mg | ORAL_CAPSULE | Freq: Every morning | ORAL | 5 refills | Status: DC
  Filled 2023-07-25: qty 30, 30d supply, fill #0
  Filled 2023-08-26: qty 30, 30d supply, fill #1
  Filled 2023-09-24: qty 30, 30d supply, fill #2
  Filled 2023-10-28: qty 30, 30d supply, fill #3
  Filled 2023-12-02: qty 30, 30d supply, fill #4
  Filled 2024-01-08: qty 30, 30d supply, fill #5

## 2023-08-12 ENCOUNTER — Other Ambulatory Visit (HOSPITAL_COMMUNITY): Payer: Self-pay

## 2023-08-13 ENCOUNTER — Other Ambulatory Visit: Payer: Self-pay

## 2023-08-13 ENCOUNTER — Other Ambulatory Visit (HOSPITAL_COMMUNITY): Payer: Self-pay

## 2023-08-13 MED ORDER — FENOFIBRATE MICRONIZED 134 MG PO CAPS
134.0000 mg | ORAL_CAPSULE | Freq: Every day | ORAL | 5 refills | Status: DC
  Filled 2023-08-13: qty 30, 30d supply, fill #0
  Filled 2023-09-18: qty 30, 30d supply, fill #1
  Filled 2023-10-14: qty 30, 30d supply, fill #2
  Filled 2023-11-11: qty 30, 30d supply, fill #3
  Filled 2023-12-16: qty 30, 30d supply, fill #4
  Filled 2024-01-16: qty 30, 30d supply, fill #5

## 2023-08-13 MED ORDER — OMEPRAZOLE 20 MG PO CPDR
20.0000 mg | DELAYED_RELEASE_CAPSULE | Freq: Every day | ORAL | 5 refills | Status: DC
  Filled 2023-08-13: qty 30, 30d supply, fill #0
  Filled 2023-09-18: qty 30, 30d supply, fill #1
  Filled 2023-10-14: qty 30, 30d supply, fill #2
  Filled 2023-11-11: qty 30, 30d supply, fill #3
  Filled 2023-12-16: qty 30, 30d supply, fill #4
  Filled 2024-01-16: qty 30, 30d supply, fill #5

## 2023-08-13 MED ORDER — TACROLIMUS 0.5 MG PO CAPS
0.5000 mg | ORAL_CAPSULE | Freq: Every evening | ORAL | 5 refills | Status: DC
  Filled 2023-08-13: qty 30, 30d supply, fill #0
  Filled 2023-09-18: qty 30, 30d supply, fill #1
  Filled 2023-10-14: qty 30, 30d supply, fill #2
  Filled 2023-11-11: qty 30, 30d supply, fill #3
  Filled 2023-12-16: qty 30, 30d supply, fill #4
  Filled 2024-01-27 – 2024-01-29 (×5): qty 30, 30d supply, fill #5

## 2023-08-19 ENCOUNTER — Other Ambulatory Visit (HOSPITAL_COMMUNITY): Payer: Self-pay

## 2023-08-20 ENCOUNTER — Other Ambulatory Visit: Payer: Self-pay

## 2023-08-20 ENCOUNTER — Other Ambulatory Visit (HOSPITAL_COMMUNITY): Payer: Self-pay

## 2023-08-20 MED ORDER — MYCOPHENOLATE SODIUM 180 MG PO TBEC
720.0000 mg | DELAYED_RELEASE_TABLET | Freq: Two times a day (BID) | ORAL | 5 refills | Status: DC
  Filled 2023-08-20: qty 240, 30d supply, fill #0
  Filled 2023-09-18: qty 240, 30d supply, fill #1
  Filled 2023-10-14: qty 240, 30d supply, fill #2
  Filled 2023-11-15: qty 240, 30d supply, fill #3
  Filled 2023-12-16: qty 240, 30d supply, fill #4
  Filled 2024-01-27 – 2024-01-29 (×4): qty 240, 30d supply, fill #5

## 2023-08-20 MED ORDER — SULFAMETHOXAZOLE-TRIMETHOPRIM 400-80 MG PO TABS
1.0000 | ORAL_TABLET | ORAL | 5 refills | Status: DC
  Filled 2023-08-20 – 2023-08-26 (×2): qty 12, 28d supply, fill #0
  Filled 2023-09-18: qty 12, 28d supply, fill #1
  Filled 2023-10-14: qty 12, 28d supply, fill #2
  Filled 2023-11-11: qty 12, 28d supply, fill #3
  Filled 2023-12-09: qty 12, 28d supply, fill #4
  Filled 2024-01-01: qty 12, 28d supply, fill #5

## 2023-08-22 ENCOUNTER — Other Ambulatory Visit (HOSPITAL_COMMUNITY): Payer: Self-pay

## 2023-08-25 ENCOUNTER — Other Ambulatory Visit (HOSPITAL_COMMUNITY): Payer: Self-pay

## 2023-08-25 ENCOUNTER — Encounter (HOSPITAL_COMMUNITY): Payer: Self-pay | Admitting: Pharmacist

## 2023-08-27 ENCOUNTER — Other Ambulatory Visit (HOSPITAL_COMMUNITY): Payer: Self-pay

## 2023-08-27 DIAGNOSIS — Z940 Kidney transplant status: Secondary | ICD-10-CM | POA: Diagnosis not present

## 2023-09-01 DIAGNOSIS — L820 Inflamed seborrheic keratosis: Secondary | ICD-10-CM | POA: Diagnosis not present

## 2023-09-01 DIAGNOSIS — D1801 Hemangioma of skin and subcutaneous tissue: Secondary | ICD-10-CM | POA: Diagnosis not present

## 2023-09-01 DIAGNOSIS — L821 Other seborrheic keratosis: Secondary | ICD-10-CM | POA: Diagnosis not present

## 2023-09-01 DIAGNOSIS — D2261 Melanocytic nevi of right upper limb, including shoulder: Secondary | ICD-10-CM | POA: Diagnosis not present

## 2023-09-01 DIAGNOSIS — D2272 Melanocytic nevi of left lower limb, including hip: Secondary | ICD-10-CM | POA: Diagnosis not present

## 2023-09-01 DIAGNOSIS — D2262 Melanocytic nevi of left upper limb, including shoulder: Secondary | ICD-10-CM | POA: Diagnosis not present

## 2023-09-04 DIAGNOSIS — I4892 Unspecified atrial flutter: Secondary | ICD-10-CM | POA: Diagnosis not present

## 2023-09-04 DIAGNOSIS — M109 Gout, unspecified: Secondary | ICD-10-CM | POA: Diagnosis not present

## 2023-09-04 DIAGNOSIS — I129 Hypertensive chronic kidney disease with stage 1 through stage 4 chronic kidney disease, or unspecified chronic kidney disease: Secondary | ICD-10-CM | POA: Diagnosis not present

## 2023-09-04 DIAGNOSIS — I2510 Atherosclerotic heart disease of native coronary artery without angina pectoris: Secondary | ICD-10-CM | POA: Diagnosis not present

## 2023-09-04 DIAGNOSIS — E785 Hyperlipidemia, unspecified: Secondary | ICD-10-CM | POA: Diagnosis not present

## 2023-09-04 DIAGNOSIS — N400 Enlarged prostate without lower urinary tract symptoms: Secondary | ICD-10-CM | POA: Diagnosis not present

## 2023-09-04 DIAGNOSIS — Z940 Kidney transplant status: Secondary | ICD-10-CM | POA: Diagnosis not present

## 2023-09-04 DIAGNOSIS — I4891 Unspecified atrial fibrillation: Secondary | ICD-10-CM | POA: Diagnosis not present

## 2023-09-04 DIAGNOSIS — D751 Secondary polycythemia: Secondary | ICD-10-CM | POA: Diagnosis not present

## 2023-09-12 ENCOUNTER — Other Ambulatory Visit (HOSPITAL_COMMUNITY): Payer: Self-pay

## 2023-09-12 ENCOUNTER — Other Ambulatory Visit: Payer: Self-pay

## 2023-09-25 ENCOUNTER — Other Ambulatory Visit (HOSPITAL_COMMUNITY): Payer: Self-pay

## 2023-10-01 ENCOUNTER — Other Ambulatory Visit (HOSPITAL_COMMUNITY): Payer: Self-pay

## 2023-10-12 ENCOUNTER — Other Ambulatory Visit (HOSPITAL_COMMUNITY): Payer: Self-pay

## 2023-10-13 ENCOUNTER — Other Ambulatory Visit (HOSPITAL_COMMUNITY): Payer: Self-pay

## 2023-10-13 MED ORDER — ALLOPURINOL 100 MG PO TABS
200.0000 mg | ORAL_TABLET | Freq: Every day | ORAL | 5 refills | Status: DC
  Filled 2023-10-13: qty 60, 30d supply, fill #0
  Filled 2023-11-11: qty 60, 30d supply, fill #1
  Filled 2023-12-16: qty 60, 30d supply, fill #2
  Filled 2024-01-16: qty 60, 30d supply, fill #3
  Filled 2024-02-16: qty 60, 30d supply, fill #4
  Filled 2024-03-16: qty 60, 30d supply, fill #5

## 2023-10-13 MED ORDER — ICOSAPENT ETHYL 1 G PO CAPS
2.0000 g | ORAL_CAPSULE | Freq: Two times a day (BID) | ORAL | 5 refills | Status: DC
  Filled 2023-10-13: qty 120, 30d supply, fill #0
  Filled 2023-11-11: qty 120, 30d supply, fill #1
  Filled 2023-12-16: qty 120, 30d supply, fill #2
  Filled 2024-01-16: qty 120, 30d supply, fill #3
  Filled 2024-02-16: qty 120, 30d supply, fill #4
  Filled 2024-03-16: qty 120, 30d supply, fill #5

## 2023-10-13 MED ORDER — LOSARTAN POTASSIUM 25 MG PO TABS
25.0000 mg | ORAL_TABLET | Freq: Every day | ORAL | 5 refills | Status: DC
  Filled 2023-10-13: qty 30, 30d supply, fill #0
  Filled 2023-11-11: qty 30, 30d supply, fill #1
  Filled 2023-12-16: qty 30, 30d supply, fill #2
  Filled 2024-01-16: qty 30, 30d supply, fill #3
  Filled 2024-02-16: qty 30, 30d supply, fill #4
  Filled 2024-03-16: qty 30, 30d supply, fill #5

## 2023-10-14 ENCOUNTER — Other Ambulatory Visit: Payer: Self-pay

## 2023-10-15 ENCOUNTER — Other Ambulatory Visit (HOSPITAL_COMMUNITY): Payer: Self-pay

## 2023-10-15 ENCOUNTER — Other Ambulatory Visit: Payer: Self-pay

## 2023-10-17 ENCOUNTER — Other Ambulatory Visit: Payer: Self-pay

## 2023-11-11 ENCOUNTER — Other Ambulatory Visit (HOSPITAL_COMMUNITY): Payer: Self-pay

## 2023-11-12 ENCOUNTER — Other Ambulatory Visit: Payer: Self-pay

## 2023-11-12 ENCOUNTER — Other Ambulatory Visit (HOSPITAL_COMMUNITY): Payer: Self-pay

## 2023-11-12 MED ORDER — PREDNISONE 5 MG PO TABS
5.0000 mg | ORAL_TABLET | Freq: Every day | ORAL | 5 refills | Status: DC
  Filled 2023-11-12 – 2023-11-15 (×2): qty 30, 30d supply, fill #0
  Filled 2023-12-16: qty 30, 30d supply, fill #1
  Filled 2024-01-16: qty 30, 30d supply, fill #2
  Filled 2024-02-16: qty 30, 30d supply, fill #3
  Filled 2024-03-16: qty 30, 30d supply, fill #4
  Filled 2024-04-12: qty 30, 30d supply, fill #5

## 2023-11-15 ENCOUNTER — Other Ambulatory Visit (HOSPITAL_COMMUNITY): Payer: Self-pay

## 2023-12-09 ENCOUNTER — Other Ambulatory Visit: Payer: Self-pay | Admitting: Internal Medicine

## 2023-12-09 NOTE — Telephone Encounter (Signed)
 Question patient has not been seen since 2023, and was suppose to schedule an appt within 12 month of his visit. Patient is asking for a refill.

## 2023-12-10 ENCOUNTER — Other Ambulatory Visit: Payer: Self-pay

## 2023-12-12 ENCOUNTER — Ambulatory Visit: Payer: 59 | Attending: Internal Medicine | Admitting: Internal Medicine

## 2023-12-12 ENCOUNTER — Encounter: Payer: Self-pay | Admitting: Internal Medicine

## 2023-12-12 VITALS — BP 124/72 | HR 53 | Ht 68.0 in | Wt 209.6 lb

## 2023-12-12 DIAGNOSIS — R001 Bradycardia, unspecified: Secondary | ICD-10-CM | POA: Diagnosis not present

## 2023-12-12 DIAGNOSIS — I491 Atrial premature depolarization: Secondary | ICD-10-CM

## 2023-12-12 DIAGNOSIS — Z79899 Other long term (current) drug therapy: Secondary | ICD-10-CM

## 2023-12-12 DIAGNOSIS — I4892 Unspecified atrial flutter: Secondary | ICD-10-CM | POA: Diagnosis not present

## 2023-12-12 NOTE — Progress Notes (Signed)
 .    A serie  Patient Care Team: Patient, No Pcp Per as PCP - General (General Practice)   HPI  [name] is a 63 y.o. male Seen in followup for concerns about atrial fibrillation in the context of significant left atrial enlargement prior atrial flutter for which he underwent catheter ablation 2013 and left ventricular hypertrophy.  Presumed HCM.  Spoke with Dr. Fairy.  Has deferred genetic testing. Prior renal transplant Ischemic heart disease with prior bypass surgery 4/13 and had post CABG atrial fibrillation.   Atypical and typical flutter but amplitudes particularly in lead V1 which are very atypical.  He saw Dr. MILUS and underwent catheter ablation for atrial flutter as well as atrial fibrillation.  9/18.. Atrial flutter was noninducible.  Pulmonary vein isolation was accomplished.  Multiple PACs.  Further repeat ablation was not recommended   Now maintained on  Amio;as undergone interval ablation by Dr Edwyna.   But noted no interval benefit in his patient experience overall was really quite negative.  At our last visit 3/22 discussed alternative referrals, [facility], [place], [facility].  Patient denies symptoms of respiratory, GI intolerance, sun sensitivity, neurological symptoms attributable to amiodarone .     The patient denies chest pain, shortness of breath, nocturnal dyspnea, orthopnea or peripheral edema.  There have been no  lightheadedness or syncope.   Some atrial fibrillation that lasts a couple of minutes no change in frequency  Lots going on in recent months.  His mother passed away.  He has had some contact with his birth mother, she died within the last year    Has hx of remote stroke      DATE TEST EF   9/15 Echo   55-65 % LVH severe LAE (53/2.4)  7/16 cMRI   ASH Mid--apical  cavitary obliteration  No contrast 2/2 renal disease  9/18 TEE 65% HCM   1/22 Echo  70-75%    Date Cr K TSH LFTs Hgb  9/17 1.5 4.6      10/19 1.5 4.1   17  5/20 1.26 4.7   16.1  3/21  1.56 4.6 1.12    2/22 1.73 4.8 2.04 27   9/22   1.01    12/23 1.67 4.0                 He has resting bradycardia  Past Medical History:  Diagnosis Date   Arteriovenous fistula, acquired (HCC)    Atrial fibrillation and flutter (HCC)    Bladder stones    BMI 31.0-31.9,adult    CAD (coronary artery disease)    a. s/p CABG 03/2012: LIMA to LAD, free RIMA to OM1, SVG to D1, sequential SVG to AM and LPLB2, EVH via right thigh and leg.   Cellulitis 04/13/2012   RLE saphenous vein harvest incision    CKD (chronic kidney disease)    Dyslipidemia    Gout    History of kidney transplant    Hyperglycemia    Hyperlipidemia    Hyperparathyroidism    Hypertension    Hypertrophic cardiomyopathy (HCC)    Hypomagnesemia    Immunosuppression (HCC)    Intermittent palpitations    Microscopic hematuria    Migraines    PAF and Flutter    a. Afib post-op CABG; AFlutter 10/2012   Peripheral vascular disease (HCC)    Post-transplant erythrocytosis    Renal disease    S/P living-donor kidney transplantation    Sinus node dysfunction-post termination pause  a. >8sec in setting of aflutter 10/2012   Sleep pattern disturbance    Stroke [facility]) 1995; 2001   denies residual   Superficial vein thrombosis    a. R greater saphenous 04/2012   Vitamin D  deficiency    VT (ventricular tachycardia) (HCC)     Past Surgical History:  Procedure Laterality Date   ATRIAL FIBRILLATION ABLATION N/A 08/21/2017   Procedure: Atrial Fibrillation Ablation;  Surgeon: Kelsie Agent, MD;  Location: [facility] INVASIVE CV LAB;  Service: Cardiovascular;  Laterality: N/A;   ATRIAL FLUTTER ABLATION N/A 11/09/2012   Procedure: ATRIAL FLUTTER ABLATION;  Surgeon: Elspeth JAYSON Sage, MD;  Location: [facility] CATH LAB;  Service: Cardiovascular;  Laterality: N/A;   AV FISTULA PLACEMENT  2011   left forearm   AV FISTULA PLACEMENT     CARDIAC CATHETERIZATION  03/16/12   CARDIOVERSION N/A 06/10/2017   Procedure: CARDIOVERSION;  Surgeon: Mona Vinie JAYSON, MD;  Location: [facility] ENDOSCOPY;  Service: Cardiovascular;  Laterality: N/A;   CARDIOVERSION N/A 10/13/2017   Procedure: CARDIOVERSION;  Surgeon: Maranda Leim DEL, MD;  Location: [facility] ENDOSCOPY;  Service: Cardiovascular;  Laterality: N/A;   CARDIOVERSION N/A 10/08/2018   Procedure: CARDIOVERSION;  Surgeon: Rolan Ezra RAMAN, MD;  Location: [facility] ENDOSCOPY;  Service: Cardiovascular;  Laterality: N/A;   CARDIOVERSION N/A 04/27/2019   Procedure: CARDIOVERSION;  Surgeon: Maranda Leim DEL, MD;  Location: [facility] ENDOSCOPY;  Service: Cardiovascular;  Laterality: N/A;   CARDIOVERSION N/A 02/24/2020   Procedure: CARDIOVERSION;  Surgeon: Kate Lonni CROME, MD;  Location: [facility] ENDOSCOPY;  Service: Cardiovascular;  Laterality: N/A;   CORONARY ARTERY BYPASS GRAFT  03/19/2012   Procedure: CORONARY ARTERY BYPASS GRAFTING (CABG);  Surgeon: Sudie DEL Laine, MD;  [facility];  Service: Open Heart Surgery;  Laterality: N/A;  (B) MAMMARY   CYSTOSCOPY WITH BIOPSY N/A 08/15/2016   Procedure: CYSTOSCOPY WITH IDENTIFICATION OF RIGHT TRANSPLANTATION OF URETRAL ORFICE WITH FLUORESCEIN ;  Surgeon: Arlena Gal, MD;  Location: [facility] ORS;  Service: Urology;  Laterality: N/A;   DENTAL SURGERY  2008   multiple   ELBOW FRACTURE SURGERY  1972   left   FRACTURE SURGERY     HERNIA REPAIR  09/2009   umbilical   HOLMIUM LASER APPLICATION N/A 08/15/2016   Procedure: HOLMIUM LASER APPLICATION WITH 3 CM RIGHT BLADDER STONE;  Surgeon: Arlena Gal, MD;  Location: [facility] ORS;  Service: Urology;  Laterality: N/A;   INGUINAL HERNIA REPAIR  09/2009   bilaterally   KIDNEY TRANSPLANT     LEFT HEART CATH AND CORS/GRAFTS ANGIOGRAPHY N/A 07/27/2019   Procedure: LEFT HEART CATH AND CORS/GRAFTS ANGIOGRAPHY;  Surgeon: Wonda Sharper, MD;  Location: [facility] INVASIVE CV LAB;  Service: Cardiovascular;  Laterality: N/A;   LEFT HEART CATHETERIZATION WITH CORONARY ANGIOGRAM N/A 03/16/2012   Procedure: LEFT HEART CATHETERIZATION WITH CORONARY  ANGIOGRAM;  Surgeon: Sharper Wonda, MD;  Location: [facility] CATH LAB;  Service: Cardiovascular;  Laterality: N/A;   RENAL ARTERY STENT  2001   TEE WITHOUT CARDIOVERSION N/A 08/21/2017   Procedure: TRANSESOPHAGEAL ECHOCARDIOGRAM (TEE);  Surgeon: Rolan Ezra RAMAN, MD;  Location: [facility] ENDOSCOPY;  Service: Cardiovascular;  Laterality: N/A;   TRANSURETHRAL RESECTION OF BLADDER TUMOR N/A 08/15/2016   Procedure: TRANSURETHRAL BIOPSY OF RIGHT BLADDER WALL;  Surgeon: Arlena Gal, MD;  Location: [facility] ORS;  Service: Urology;  Laterality: N/A;     Allergies  Allergen Reactions   Iodinated Contrast Media Other (See Comments) and Shortness Of Breath    Pt states that this medication causes him to code.  Respiratory arrest   Penicillins Itching, Rash and Other (See Comments)    Has patient had a PCN reaction causing immediate rash, facial/tongue/throat swelling, SOB or lightheadedness with hypotension: No Has patient had a PCN reaction causing severe rash involving mucus membranes or skin necrosis: No Has patient had a PCN reaction that required hospitalization No Has patient had a PCN reaction occurring within the last 10 years: No If all of the above answers are NO, then may proceed with Cephalosporin use.   Lisinopril     Increased Cr   Current Meds  Medication Sig   allopurinol  (ZYLOPRIM ) 100 MG tablet Take 2 tablets (200 mg total) by mouth daily.   amiodarone  (PACERONE ) 200 MG tablet Take 1 tablet (200 mg total) by mouth daily.   apixaban  (ELIQUIS ) 5 MG TABS tablet Take 1 tablet (5 mg total) by mouth 2 (two) times daily.   colchicine  0.6 MG tablet Take 1 tablet (0.6 mg total) by mouth daily as needed. Max 1 tablet daily   doxycycline  (VIBRA -TABS) 100 MG tablet Take 1 tablet (100 mg total) by mouth 2 (two) times daily.   fenofibrate  micronized (LOFIBRA) 134 MG capsule Take 1 capsule (134 mg total) by mouth daily.   fluticasone (FLONASE) 50 MCG/ACT nasal spray Place 1 spray into both nostrils as  needed for allergies or rhinitis.   furosemide  (LASIX ) 40 MG tablet Take 1 tablet (40 mg total) by mouth daily as needed.   icosapent  Ethyl (VASCEPA ) 1 g capsule Take 2 capsules (2 g total) by mouth 2 (two) times daily.   losartan  (COZAAR ) 25 MG tablet Take 1 tablet (25 mg total) by mouth daily.   magnesium  oxide (MAG-OX) 400 (241.3 Mg) MG tablet Take 400 mg by mouth 2 (two) times daily.   methocarbamol  (ROBAXIN ) 500 MG tablet Take 1 tablet (500 mg total) by mouth 3 (three) times daily as needed.   metoprolol  tartrate (LOPRESSOR ) 25 MG tablet Take 1 tablet (25 mg total) by mouth 2 (two) times daily.   mycophenolate  (MYFORTIC ) 180 MG EC tablet Take 4 tablets (720 mg total) by mouth 2 (two) times daily.   omeprazole  (PRILOSEC) 20 MG capsule Take 1 capsule (20 mg total) by mouth daily.   predniSONE  (DELTASONE ) 5 MG tablet Take 1 tablet (5 mg total) by mouth daily.   rosuvastatin  (CRESTOR ) 20 MG tablet Take 1 tablet (20 mg total) by mouth daily.   sulfamethoxazole -trimethoprim  (BACTRIM ) 400-80 MG tablet Take 1 tablet by mouth 3 (three) times a week on Mondays, Wednesdays & Fridays   tacrolimus  (PROGRAF ) 0.5 MG capsule Take 1 capsule (0.5 mg total) by mouth every evening.   tacrolimus  (PROGRAF ) 1 MG capsule Take 1 capsule (1 mg total) by mouth every morning.   tadalafil  (CIALIS ) 5 MG tablet Take 1 tablet (5 mg total) by mouth daily.      Review of Systems negative except from HPI and PMH Blood pressure 124/72, pulse (!) 53, height 5' 8 (1.727 m), weight 209 lb 9.6 oz (95.1 kg), SpO2 98%. s somewhat Well developed and nourished in no acute distress HENT normal Neck supple with JVP-  flat  Clear Regular rate and rhythm, no murmurs or gallops Abd-soft with active BS No Clubbing cyanosis edema Skin-warm and dry A & Oriented  Grossly normal sensory and motor function  ECG sinus @ 53 22/15/53 IVCD qR aVL RS V6 and monophasic R wave lead I no fractionation noted on the precordium amiodarone   labs  Assessment and  Plan  Atrial Flutter  S/p RFCA-- recurrent and atypical s/p ablation 2018-JA   Freq PACs   LAE   LVH-Asymmetric 18/11  HCM    IVCD progression   CAD s/p CABG 2013   Sleep disordered breathing   Sinus bradycardia   Status post kidney transplant   No sustained atrial arrhythmias.  Some palpitations.  Continue amiodarone .  Needs amiodarone  surveillance laboratories.  Tolerating apixaban  without bleeding.  Continue.  Check CBC.  Blood work is due for his nephrologist in the beginning of February.  Blood pressure is well-controlled we will continue losartan  25 metoprolol  25 twice daily  Without symptoms of ischemia.  Continue atorvastatin  last LDL was 145.

## 2023-12-12 NOTE — Patient Instructions (Addendum)
 Medication Instructions:  Your physician recommends that you continue on your current medications as directed. Please refer to the Current Medication list given to you today.  *If you need a refill on your cardiac medications before your next appointment, please call your pharmacy*   Lab Work: TSH and liver panel  If you have labs (blood work) drawn today and your tests are completely normal, you will receive your results only by: MyChart Message (if you have MyChart) OR A paper copy in the mail If you have any lab test that is abnormal or we need to change your treatment, we will call you to review the results.   Testing/Procedures: None ordered.    Follow-Up: At [facility], you and your health needs are our priority.  As part of our continuing mission to provide you with exceptional heart care, we have created designated Provider Care Teams.  These Care Teams include your primary Cardiologist (physician) and Advanced Practice Providers (APPs -  Physician Assistants and Nurse Practitioners) who all work together to provide you with the care you need, when you need it.  We recommend signing up for the patient portal called MyChart.  Sign up information is provided on this After Visit Summary.  MyChart is used to connect with patients for Virtual Visits (Telemedicine).  Patients are able to view lab/test results, encounter notes, upcoming appointments, etc.  Non-urgent messages can be sent to your provider as well.   To learn more about what you can do with MyChart, go to [email or web address].    Your next appointment:   6 months  with Dr Fernande

## 2023-12-16 ENCOUNTER — Other Ambulatory Visit: Payer: Self-pay | Admitting: Internal Medicine

## 2023-12-17 ENCOUNTER — Other Ambulatory Visit: Payer: Self-pay

## 2023-12-17 ENCOUNTER — Other Ambulatory Visit (HOSPITAL_COMMUNITY): Payer: Self-pay

## 2023-12-17 MED ORDER — AMIODARONE HCL 200 MG PO TABS
200.0000 mg | ORAL_TABLET | Freq: Every day | ORAL | 3 refills | Status: DC
  Filled 2023-12-17: qty 90, 90d supply, fill #0
  Filled 2024-03-16: qty 90, 90d supply, fill #1
  Filled 2024-06-14: qty 90, 90d supply, fill #2
  Filled 2024-09-12: qty 90, 90d supply, fill #3

## 2023-12-17 MED ORDER — METOPROLOL TARTRATE 25 MG PO TABS
25.0000 mg | ORAL_TABLET | Freq: Two times a day (BID) | ORAL | 3 refills | Status: DC
  Filled 2023-12-17: qty 180, 90d supply, fill #0
  Filled 2024-03-16: qty 180, 90d supply, fill #1
  Filled 2024-06-14: qty 180, 90d supply, fill #2
  Filled 2024-09-12: qty 180, 90d supply, fill #3

## 2023-12-18 ENCOUNTER — Other Ambulatory Visit (HOSPITAL_COMMUNITY): Payer: Self-pay

## 2023-12-23 DIAGNOSIS — R001 Bradycardia, unspecified: Secondary | ICD-10-CM | POA: Diagnosis not present

## 2023-12-23 DIAGNOSIS — I4892 Unspecified atrial flutter: Secondary | ICD-10-CM | POA: Diagnosis not present

## 2023-12-23 DIAGNOSIS — Z79899 Other long term (current) drug therapy: Secondary | ICD-10-CM | POA: Diagnosis not present

## 2023-12-23 DIAGNOSIS — I491 Atrial premature depolarization: Secondary | ICD-10-CM | POA: Diagnosis not present

## 2023-12-24 ENCOUNTER — Other Ambulatory Visit (HOSPITAL_COMMUNITY): Payer: Self-pay

## 2023-12-24 LAB — TSH: TSH: 2.92 u[IU]/mL (ref 0.450–4.500)

## 2023-12-24 LAB — HEPATIC FUNCTION PANEL
ALT: 45 [IU]/L — ABNORMAL HIGH (ref 0–44)
AST: 63 [IU]/L — ABNORMAL HIGH (ref 0–40)
Albumin: 4.2 g/dL (ref 3.8–4.9)
Alkaline Phosphatase: 52 [IU]/L (ref 44–121)
Bilirubin Total: 0.5 mg/dL (ref 0.0–1.2)
Bilirubin, Direct: 0.19 mg/dL (ref 0.00–0.40)
Total Protein: 6.3 g/dL (ref 6.0–8.5)

## 2023-12-25 ENCOUNTER — Other Ambulatory Visit (HOSPITAL_COMMUNITY): Payer: Self-pay

## 2023-12-25 MED ORDER — TADALAFIL 5 MG PO TABS
5.0000 mg | ORAL_TABLET | Freq: Every day | ORAL | 3 refills | Status: AC
  Filled 2023-12-25: qty 90, 90d supply, fill #0
  Filled 2024-03-23: qty 90, 90d supply, fill #1

## 2023-12-31 ENCOUNTER — Other Ambulatory Visit: Payer: Self-pay

## 2023-12-31 ENCOUNTER — Other Ambulatory Visit (HOSPITAL_BASED_OUTPATIENT_CLINIC_OR_DEPARTMENT_OTHER): Payer: Self-pay

## 2023-12-31 ENCOUNTER — Emergency Department (HOSPITAL_BASED_OUTPATIENT_CLINIC_OR_DEPARTMENT_OTHER): Payer: 59 | Admitting: Radiology

## 2023-12-31 ENCOUNTER — Emergency Department (HOSPITAL_BASED_OUTPATIENT_CLINIC_OR_DEPARTMENT_OTHER)
Admission: EM | Admit: 2023-12-31 | Discharge: 2023-12-31 | Disposition: A | Discharge status: Home / Self Care | Payer: 59 | Attending: Emergency Medicine | Admitting: Emergency Medicine

## 2023-12-31 ENCOUNTER — Encounter (HOSPITAL_BASED_OUTPATIENT_CLINIC_OR_DEPARTMENT_OTHER): Payer: Self-pay | Admitting: Emergency Medicine

## 2023-12-31 DIAGNOSIS — Z79899 Other long term (current) drug therapy: Secondary | ICD-10-CM | POA: Insufficient documentation

## 2023-12-31 DIAGNOSIS — I129 Hypertensive chronic kidney disease with stage 1 through stage 4 chronic kidney disease, or unspecified chronic kidney disease: Secondary | ICD-10-CM | POA: Insufficient documentation

## 2023-12-31 DIAGNOSIS — I2510 Atherosclerotic heart disease of native coronary artery without angina pectoris: Secondary | ICD-10-CM | POA: Diagnosis not present

## 2023-12-31 DIAGNOSIS — R0989 Other specified symptoms and signs involving the circulatory and respiratory systems: Secondary | ICD-10-CM | POA: Diagnosis not present

## 2023-12-31 DIAGNOSIS — R0789 Other chest pain: Secondary | ICD-10-CM | POA: Diagnosis not present

## 2023-12-31 DIAGNOSIS — Z951 Presence of aortocoronary bypass graft: Secondary | ICD-10-CM | POA: Diagnosis not present

## 2023-12-31 DIAGNOSIS — R079 Chest pain, unspecified: Secondary | ICD-10-CM | POA: Insufficient documentation

## 2023-12-31 DIAGNOSIS — R059 Cough, unspecified: Secondary | ICD-10-CM | POA: Insufficient documentation

## 2023-12-31 DIAGNOSIS — I517 Cardiomegaly: Secondary | ICD-10-CM | POA: Diagnosis not present

## 2023-12-31 DIAGNOSIS — Z7901 Long term (current) use of anticoagulants: Secondary | ICD-10-CM | POA: Diagnosis not present

## 2023-12-31 DIAGNOSIS — B338 Other specified viral diseases: Secondary | ICD-10-CM

## 2023-12-31 DIAGNOSIS — Z20822 Contact with and (suspected) exposure to covid-19: Secondary | ICD-10-CM | POA: Insufficient documentation

## 2023-12-31 DIAGNOSIS — N189 Chronic kidney disease, unspecified: Secondary | ICD-10-CM | POA: Insufficient documentation

## 2023-12-31 DIAGNOSIS — B974 Respiratory syncytial virus as the cause of diseases classified elsewhere: Secondary | ICD-10-CM | POA: Insufficient documentation

## 2023-12-31 LAB — RESP PANEL BY RT-PCR (RSV, FLU A&B, COVID)  RVPGX2
Influenza A by PCR: NEGATIVE
Influenza B by PCR: NEGATIVE
Resp Syncytial Virus by PCR: POSITIVE — AB
SARS Coronavirus 2 by RT PCR: NEGATIVE

## 2023-12-31 MED ORDER — IPRATROPIUM-ALBUTEROL 0.5-2.5 (3) MG/3ML IN SOLN
3.0000 mL | RESPIRATORY_TRACT | Status: AC
  Administered 2023-12-31 (×3): 3 mL via RESPIRATORY_TRACT
  Filled 2023-12-31: qty 6
  Filled 2023-12-31: qty 3

## 2023-12-31 MED ORDER — BENZONATATE 100 MG PO CAPS
100.0000 mg | ORAL_CAPSULE | Freq: Three times a day (TID) | ORAL | 0 refills | Status: AC
  Filled 2023-12-31: qty 21, 7d supply, fill #0

## 2023-12-31 MED ORDER — METHYLPREDNISOLONE 4 MG PO TBPK
ORAL_TABLET | ORAL | 0 refills | Status: AC
  Filled 2023-12-31: qty 21, 6d supply, fill #0

## 2023-12-31 MED ORDER — METHYLPREDNISOLONE SODIUM SUCC 125 MG IJ SOLR
125.0000 mg | Freq: Once | INTRAMUSCULAR | Status: AC
  Administered 2023-12-31: 125 mg via INTRAVENOUS
  Filled 2023-12-31: qty 2

## 2023-12-31 MED ORDER — AZITHROMYCIN 250 MG PO TABS
ORAL_TABLET | ORAL | 0 refills | Status: DC
  Filled 2023-12-31: qty 6, 5d supply, fill #0

## 2023-12-31 MED ORDER — ACETAMINOPHEN-CODEINE 300-30 MG PO TABS
1.0000 | ORAL_TABLET | Freq: Three times a day (TID) | ORAL | 0 refills | Status: AC | PRN
  Filled 2023-12-31: qty 12, 2d supply, fill #0

## 2023-12-31 MED ORDER — ALBUTEROL SULFATE HFA 108 (90 BASE) MCG/ACT IN AERS
1.0000 | INHALATION_SPRAY | Freq: Four times a day (QID) | RESPIRATORY_TRACT | 0 refills | Status: AC | PRN
  Filled 2023-12-31: qty 6.7, 25d supply, fill #0

## 2023-12-31 MED ORDER — ALBUTEROL SULFATE (2.5 MG/3ML) 0.083% IN NEBU: 2.5000 mg | INHALATION_SOLUTION | Freq: Once | RESPIRATORY_TRACT | Status: DC

## 2023-12-31 MED ORDER — IPRATROPIUM-ALBUTEROL 0.5-2.5 (3) MG/3ML IN SOLN: 3.0000 mL | Freq: Once | RESPIRATORY_TRACT | Status: DC

## 2023-12-31 NOTE — ED Triage Notes (Signed)
Pt c/o cough, congestion x 5 days, endorses coughing so hard last night, feels like "I broke a rib". Reports RT side CP.

## 2023-12-31 NOTE — ED Notes (Signed)
Discharge paperwork given and verbally understood.

## 2023-12-31 NOTE — ED Provider Notes (Signed)
[place] EMERGENCY DEPARTMENT AT [facility] Provider Note   CSN: [id number] Arrival date & time: 12/31/23  1000     History  Chief Complaint  Patient presents with   Chest Pain    [name] is a 63 y.o. male.  Patient with history of CAD status post CABG in 2013, CKD status post renal transplant, hyperlipidemia, hypertension, A-fib on Eliquis presents today with complaints of cough.  He states that same began 6 days ago and has been persistent since then.  His wife is home sick with similar symptoms. States that he has coughed so much that his chest is hurting.  States that his chest only hurts when he coughs. Denies shortness of breath. States that he gets these URIs every year and comes in for same and receives antibiotics, steroids, an inhaler, and cough medication and feels better. Denies leg pain or leg swelling. He does smoke. Denies known history of asthma or COPD. No fevers or chills.  The history is provided by the patient. No language interpreter was used.  Chest Pain Associated symptoms: cough   Associated symptoms: no shortness of breath        Home Medications Prior to Admission medications   Medication Sig Start Date End Date Taking? Authorizing Provider  allopurinol (ZYLOPRIM) 100 MG tablet Take 2 tablets (200 mg total) by mouth daily. 10/13/23     amiodarone (PACERONE) 200 MG tablet Take 1 tablet (200 mg total) by mouth daily. 12/17/23   Duke Salvia, MD  apixaban (ELIQUIS) 5 MG TABS tablet Take 1 tablet (5 mg total) by mouth 2 (two) times daily. 07/08/23     colchicine 0.6 MG tablet Take 1 tablet (0.6 mg total) by mouth daily as needed. Max 1 tablet daily 12/20/21     doxycycline (VIBRA-TABS) 100 MG tablet Take 1 tablet (100 mg total) by mouth 2 (two) times daily. 06/26/23   Waldon Merl, PA-C  fenofibrate micronized (LOFIBRA) 134 MG capsule Take 1 capsule (134 mg total) by mouth daily. 08/13/23     fluticasone (FLONASE) 50 MCG/ACT nasal spray  Place 1 spray into both nostrils as needed for allergies or rhinitis.    [provider]  furosemide (LASIX) 40 MG tablet Take 1 tablet (40 mg total) by mouth daily as needed. 08/07/21   Duke Salvia, MD  icosapent Ethyl (VASCEPA) 1 g capsule Take 2 capsules (2 g total) by mouth 2 (two) times daily. 10/13/23     losartan (COZAAR) 25 MG tablet Take 1 tablet (25 mg total) by mouth daily. 10/13/23     magnesium oxide (MAG-OX) 400 (241.3 Mg) MG tablet Take 400 mg by mouth 2 (two) times daily.    [provider]  methocarbamol (ROBAXIN) 500 MG tablet Take 1 tablet (500 mg total) by mouth 3 (three) times daily as needed. 04/07/23     metoprolol tartrate (LOPRESSOR) 25 MG tablet Take 1 tablet (25 mg total) by mouth 2 (two) times daily. 12/17/23   Duke Salvia, MD  mycophenolate (MYFORTIC) 180 MG EC tablet Take 4 tablets (720 mg total) by mouth 2 (two) times daily. 08/20/23     omeprazole (PRILOSEC) 20 MG capsule Take 1 capsule (20 mg total) by mouth daily. 08/13/23     predniSONE (DELTASONE) 5 MG tablet Take 1 tablet (5 mg total) by mouth daily. 11/12/23     rosuvastatin (CRESTOR) 20 MG tablet Take 1 tablet (20 mg total) by mouth daily. 12/12/22   [name]  C, MD  sulfamethoxazole-trimethoprim (BACTRIM) 400-80 MG tablet Take 1 tablet by mouth 3 (three) times a week on Mondays, Wednesdays & Fridays 08/20/23     tacrolimus (PROGRAF) 0.5 MG capsule Take 1 capsule (0.5 mg total) by mouth every evening. 08/13/23     tacrolimus (PROGRAF) 1 MG capsule Take 1 capsule (1 mg total) by mouth every morning. 07/25/23     tadalafil (CIALIS) 5 MG tablet Take 1 tablet by mouth daily. 12/25/23         Allergies    Iodinated contrast media, Penicillins, and Lisinopril    Review of Systems   Review of Systems  HENT:  Positive for congestion.   Respiratory:  Positive for cough. Negative for shortness of breath and wheezing.   Cardiovascular:  Positive for chest pain (Only with cough).  All other  systems reviewed and are negative.   Physical Exam Updated Vital Signs BP (!) 155/85   Pulse (!) 57   Temp 98.5 F (36.9 C) (Oral)   Resp 18   Wt 90.7 kg   SpO2 99%   BMI 30.41 kg/m  Physical Exam Vitals and nursing note reviewed.  Constitutional:      General: He is not in acute distress.    Appearance: Normal appearance. He is normal weight. He is not ill-appearing, toxic-appearing or diaphoretic.  HENT:     Head: Normocephalic and atraumatic.  Cardiovascular:     Rate and Rhythm: Normal rate and regular rhythm.     Heart sounds: Normal heart sounds.  Pulmonary:     Effort: Pulmonary effort is normal. No respiratory distress.     Breath sounds: Wheezing present.  Abdominal:     Palpations: Abdomen is soft.     Tenderness: There is no abdominal tenderness.  Musculoskeletal:        General: Normal range of motion.     Cervical back: Normal range of motion.     Right lower leg: No tenderness. No edema.     Left lower leg: No tenderness. No edema.  Skin:    General: Skin is warm and dry.  Neurological:     General: No focal deficit present.     Mental Status: He is alert.  Psychiatric:        Mood and Affect: Mood normal.        Behavior: Behavior normal.     ED Results / Procedures / Treatments   Labs (all labs ordered are listed, but only abnormal results are displayed) Labs Reviewed  RESP PANEL BY RT-PCR (RSV, FLU A&B, COVID)  RVPGX2    EKG None  Radiology No results found.  Procedures Procedures    Medications Ordered in ED Medications  ipratropium-albuterol (DUONEB) 0.5-2.5 (3) MG/3ML nebulizer solution 3 mL (3 mLs Nebulization Given 12/31/23 1051)  methylPREDNISolone sodium succinate (SOLU-MEDROL) 125 mg/2 mL injection 125 mg (125 mg Intravenous Given 12/31/23 1044)    ED Course/ Medical Decision Making/ A&P                                 Medical Decision Making Amount and/or Complexity of Data Reviewed Radiology:  ordered.  Risk Prescription drug management.   Patient presents today with complaints of cough and congestion x 6 days.  They are afebrile, nontoxic-appearing, and in no acute distress with reassuring vital signs.  Physical exam reveals expiratory wheezing throughout all lung fields. CXR obtained which has resulted and reveals  1. No acute cardiopulmonary process. 2. Status post median sternotomy and CABG.  I have personally reviewed and interpreted this imaging and agree with radiology interpretation.  Patient positive for RSV, symptoms consistent with same.  Given DuoNeb x 3 with Solu-Medrol with proven.  Patient feels better and ready to go home.  Given patients initial profound wheezing with comorbid risk factors and symptoms for almost a week, will cover for superimposed bacterial infection with a z pack per patients request. Will also send for codeine cough syrup, medrol dosepak, and an albuterol inhaler for additional symptomatic relief per patients request.  PDMP reviewed, patient advised not to drive or operate heavy machinery while taking the codeine.  Evaluation and diagnostic testing in the emergency department does not suggest an emergent condition requiring admission or immediate intervention beyond what has been performed at this time.  Plan for discharge with close PCP follow-up.  Patient is understanding and amenable with plan, educated on red flag symptoms that would prompt immediate return.  Patient discharged in stable condition.  Final Clinical Impression(s) / ED Diagnoses Final diagnoses:  RSV (respiratory syncytial virus infection)    Rx / DC Orders ED Discharge Orders          Ordered    albuterol (VENTOLIN HFA) 108 (90 Base) MCG/ACT inhaler  Every 6 hours PRN        12/31/23 1206    methylPREDNISolone (MEDROL DOSEPAK) 4 MG TBPK tablet        12/31/23 1206    azithromycin (ZITHROMAX Z-PAK) 250 MG tablet        12/31/23 1206    acetaminophen-codeine (TYLENOL #3)  300-30 MG tablet  Every 8 hours PRN        12/31/23 1206    benzonatate (TESSALON) 100 MG capsule  Every 8 hours        12/31/23 1206          An After Visit Summary was printed and given to the patient.     Vear Clock 12/31/23 1208    Ernie Avena, MD 12/31/23 [phone]

## 2023-12-31 NOTE — Discharge Instructions (Signed)
As we discussed, you tested positive for RSV today.  This is likely the cause of your symptoms.  You are also wheezing exam which again is likely related to your RSV.  I have given you a prescription for several medications for you to take as prescribed for your symptoms.  The first is codeine for pain.  Take this as prescribed as needed for management of your discomfort, do not drive or operate heavy machinery while taking this medication as it can be sedating.  I have also given you a steroid Dosepak for you to take as prescribed in its entirety.  You can use the albuterol inhaler as well.  Please take the Z-Pak to cover for any bacterial infection that did not show up on your x-ray.  Complete this course.  Have also given you Tessalon to help with your cough.  Please follow-up closely with your primary care doctor.  Return if development of any new or worsening symptoms.

## 2024-01-01 ENCOUNTER — Other Ambulatory Visit: Payer: Self-pay

## 2024-01-01 DIAGNOSIS — Z79899 Other long term (current) drug therapy: Secondary | ICD-10-CM

## 2024-01-01 DIAGNOSIS — R748 Abnormal levels of other serum enzymes: Secondary | ICD-10-CM

## 2024-01-01 DIAGNOSIS — I480 Paroxysmal atrial fibrillation: Secondary | ICD-10-CM

## 2024-01-01 NOTE — Progress Notes (Signed)
Orders place and released per request of Dr Graciela Husbands   12/31/23  4:00 PM Result Note  Please inform patient that drug surveillance labs show normal thyroid, but liver tests are mildly elevated.  The last ones we have are from 2022 in our system, let plan to check again in 3 months Thanks SK

## 2024-01-07 ENCOUNTER — Other Ambulatory Visit (HOSPITAL_BASED_OUTPATIENT_CLINIC_OR_DEPARTMENT_OTHER): Payer: Self-pay

## 2024-01-15 DIAGNOSIS — N400 Enlarged prostate without lower urinary tract symptoms: Secondary | ICD-10-CM | POA: Diagnosis not present

## 2024-01-15 DIAGNOSIS — D751 Secondary polycythemia: Secondary | ICD-10-CM | POA: Diagnosis not present

## 2024-01-15 DIAGNOSIS — I2510 Atherosclerotic heart disease of native coronary artery without angina pectoris: Secondary | ICD-10-CM | POA: Diagnosis not present

## 2024-01-15 DIAGNOSIS — E785 Hyperlipidemia, unspecified: Secondary | ICD-10-CM | POA: Diagnosis not present

## 2024-01-15 DIAGNOSIS — I4891 Unspecified atrial fibrillation: Secondary | ICD-10-CM | POA: Diagnosis not present

## 2024-01-15 DIAGNOSIS — M109 Gout, unspecified: Secondary | ICD-10-CM | POA: Diagnosis not present

## 2024-01-15 DIAGNOSIS — I129 Hypertensive chronic kidney disease with stage 1 through stage 4 chronic kidney disease, or unspecified chronic kidney disease: Secondary | ICD-10-CM | POA: Diagnosis not present

## 2024-01-15 DIAGNOSIS — I4892 Unspecified atrial flutter: Secondary | ICD-10-CM | POA: Diagnosis not present

## 2024-01-15 DIAGNOSIS — Z940 Kidney transplant status: Secondary | ICD-10-CM | POA: Diagnosis not present

## 2024-01-16 ENCOUNTER — Other Ambulatory Visit (HOSPITAL_COMMUNITY): Payer: Self-pay

## 2024-01-16 MED ORDER — APIXABAN 5 MG PO TABS
5.0000 mg | ORAL_TABLET | Freq: Two times a day (BID) | ORAL | 5 refills | Status: DC
  Filled 2024-01-16: qty 60, 30d supply, fill #0
  Filled 2024-02-16: qty 60, 30d supply, fill #1
  Filled 2024-03-16: qty 60, 30d supply, fill #2
  Filled 2024-04-12: qty 60, 30d supply, fill #3
  Filled 2024-05-10: qty 60, 30d supply, fill #4
  Filled 2024-06-07: qty 60, 30d supply, fill #5

## 2024-01-19 ENCOUNTER — Other Ambulatory Visit (HOSPITAL_COMMUNITY): Payer: Self-pay

## 2024-01-19 ENCOUNTER — Other Ambulatory Visit: Payer: Self-pay

## 2024-01-21 DIAGNOSIS — M109 Gout, unspecified: Secondary | ICD-10-CM | POA: Diagnosis not present

## 2024-01-21 DIAGNOSIS — Z940 Kidney transplant status: Secondary | ICD-10-CM | POA: Diagnosis not present

## 2024-01-21 DIAGNOSIS — I129 Hypertensive chronic kidney disease with stage 1 through stage 4 chronic kidney disease, or unspecified chronic kidney disease: Secondary | ICD-10-CM | POA: Diagnosis not present

## 2024-01-27 ENCOUNTER — Other Ambulatory Visit (HOSPITAL_COMMUNITY): Payer: Self-pay

## 2024-01-27 ENCOUNTER — Other Ambulatory Visit: Payer: Self-pay | Admitting: Internal Medicine

## 2024-01-28 ENCOUNTER — Other Ambulatory Visit (HOSPITAL_COMMUNITY): Payer: Self-pay

## 2024-01-28 ENCOUNTER — Other Ambulatory Visit: Payer: Self-pay

## 2024-01-28 MED ORDER — SULFAMETHOXAZOLE-TRIMETHOPRIM 400-80 MG PO TABS
1.0000 | ORAL_TABLET | ORAL | 5 refills | Status: DC
  Filled 2024-01-28: qty 12, 28d supply, fill #0
  Filled 2024-02-25: qty 12, 28d supply, fill #1
  Filled 2024-03-18: qty 12, 28d supply, fill #2
  Filled 2024-04-14: qty 12, 28d supply, fill #3
  Filled 2024-05-12: qty 12, 28d supply, fill #4
  Filled 2024-06-03: qty 12, 28d supply, fill #5

## 2024-01-28 MED ORDER — ROSUVASTATIN CALCIUM 20 MG PO TABS
20.0000 mg | ORAL_TABLET | Freq: Every day | ORAL | 3 refills | Status: AC
  Filled 2024-01-28: qty 90, 90d supply, fill #0
  Filled 2024-04-20: qty 90, 90d supply, fill #1
  Filled 2024-07-19: qty 90, 90d supply, fill #2
  Filled 2024-10-17: qty 90, 90d supply, fill #3

## 2024-01-29 ENCOUNTER — Other Ambulatory Visit: Payer: Self-pay

## 2024-01-29 ENCOUNTER — Other Ambulatory Visit (HOSPITAL_COMMUNITY): Payer: Self-pay

## 2024-01-30 ENCOUNTER — Other Ambulatory Visit (HOSPITAL_COMMUNITY): Payer: Self-pay

## 2024-02-02 ENCOUNTER — Other Ambulatory Visit (HOSPITAL_COMMUNITY): Payer: Self-pay

## 2024-02-03 ENCOUNTER — Other Ambulatory Visit (HOSPITAL_COMMUNITY): Payer: Self-pay

## 2024-02-03 MED ORDER — TACROLIMUS 1 MG PO CAPS
1.0000 mg | ORAL_CAPSULE | Freq: Every morning | ORAL | 5 refills | Status: DC
  Filled 2024-02-03: qty 30, 30d supply, fill #0
  Filled 2024-03-10: qty 30, 30d supply, fill #1
  Filled 2024-04-09: qty 30, 30d supply, fill #2
  Filled 2024-05-06: qty 30, 30d supply, fill #3
  Filled 2024-06-05: qty 30, 30d supply, fill #4
  Filled 2024-07-10 – 2024-07-14 (×2): qty 30, 30d supply, fill #5

## 2024-02-04 ENCOUNTER — Other Ambulatory Visit (HOSPITAL_COMMUNITY): Payer: Self-pay

## 2024-02-04 ENCOUNTER — Other Ambulatory Visit: Payer: Self-pay

## 2024-02-09 DIAGNOSIS — N400 Enlarged prostate without lower urinary tract symptoms: Secondary | ICD-10-CM | POA: Diagnosis not present

## 2024-02-16 ENCOUNTER — Other Ambulatory Visit (HOSPITAL_COMMUNITY): Payer: Self-pay

## 2024-02-16 ENCOUNTER — Other Ambulatory Visit: Payer: Self-pay

## 2024-02-16 DIAGNOSIS — N400 Enlarged prostate without lower urinary tract symptoms: Secondary | ICD-10-CM | POA: Diagnosis not present

## 2024-02-16 DIAGNOSIS — R3129 Other microscopic hematuria: Secondary | ICD-10-CM | POA: Diagnosis not present

## 2024-02-16 MED ORDER — FENOFIBRATE MICRONIZED 134 MG PO CAPS
134.0000 mg | ORAL_CAPSULE | Freq: Every day | ORAL | 5 refills | Status: DC
  Filled 2024-02-16: qty 30, 30d supply, fill #0
  Filled 2024-03-16: qty 30, 30d supply, fill #1
  Filled 2024-04-12: qty 30, 30d supply, fill #2
  Filled 2024-05-10: qty 30, 30d supply, fill #3
  Filled 2024-06-07: qty 30, 30d supply, fill #4
  Filled 2024-07-06: qty 30, 30d supply, fill #5

## 2024-02-16 MED ORDER — TADALAFIL 5 MG PO TABS
5.0000 mg | ORAL_TABLET | Freq: Every day | ORAL | 3 refills | Status: AC
  Filled 2024-02-17 – 2024-03-12 (×2): qty 90, 90d supply, fill #0
  Filled 2024-06-03: qty 90, 90d supply, fill #1
  Filled 2024-09-01: qty 90, 90d supply, fill #2
  Filled 2024-11-30: qty 90, 90d supply, fill #3

## 2024-02-16 MED ORDER — OMEPRAZOLE 20 MG PO CPDR
20.0000 mg | DELAYED_RELEASE_CAPSULE | Freq: Every day | ORAL | 5 refills | Status: DC
Start: 2024-02-16 — End: 2024-08-05
  Filled 2024-02-16: qty 30, 30d supply, fill #0
  Filled 2024-03-16: qty 30, 30d supply, fill #1
  Filled 2024-04-12: qty 30, 30d supply, fill #2
  Filled 2024-05-10: qty 30, 30d supply, fill #3
  Filled 2024-06-07: qty 30, 30d supply, fill #4
  Filled 2024-07-06: qty 30, 30d supply, fill #5

## 2024-02-17 ENCOUNTER — Other Ambulatory Visit (HOSPITAL_COMMUNITY): Payer: Self-pay

## 2024-02-23 ENCOUNTER — Other Ambulatory Visit (HOSPITAL_COMMUNITY): Payer: Self-pay

## 2024-02-23 MED ORDER — MYCOPHENOLATE SODIUM 180 MG PO TBEC
720.0000 mg | DELAYED_RELEASE_TABLET | Freq: Two times a day (BID) | ORAL | 5 refills | Status: DC
  Filled 2024-03-10 (×3): qty 240, 30d supply, fill #0
  Filled 2024-04-09: qty 240, 30d supply, fill #1
  Filled 2024-05-06: qty 240, 30d supply, fill #2
  Filled 2024-06-05: qty 240, 30d supply, fill #3
  Filled 2024-07-10 – 2024-07-14 (×3): qty 240, 30d supply, fill #4
  Filled 2024-08-09: qty 240, 30d supply, fill #5

## 2024-02-23 MED ORDER — TACROLIMUS 0.5 MG PO CAPS
0.5000 mg | ORAL_CAPSULE | Freq: Every evening | ORAL | 5 refills | Status: DC
Start: 2024-02-23 — End: 2024-09-06
  Filled 2024-03-10 (×3): qty 30, 30d supply, fill #0
  Filled 2024-04-09: qty 30, 30d supply, fill #1
  Filled 2024-05-06: qty 30, 30d supply, fill #2
  Filled 2024-06-05: qty 30, 30d supply, fill #3
  Filled 2024-07-10 – 2024-07-14 (×3): qty 30, 30d supply, fill #4
  Filled 2024-08-09: qty 30, 30d supply, fill #5

## 2024-02-24 ENCOUNTER — Other Ambulatory Visit (HOSPITAL_COMMUNITY): Payer: Self-pay

## 2024-03-10 ENCOUNTER — Other Ambulatory Visit (HOSPITAL_BASED_OUTPATIENT_CLINIC_OR_DEPARTMENT_OTHER): Payer: Self-pay

## 2024-03-10 ENCOUNTER — Other Ambulatory Visit (HOSPITAL_COMMUNITY): Payer: Self-pay

## 2024-03-11 ENCOUNTER — Other Ambulatory Visit: Payer: Self-pay

## 2024-03-12 ENCOUNTER — Other Ambulatory Visit (HOSPITAL_BASED_OUTPATIENT_CLINIC_OR_DEPARTMENT_OTHER): Payer: Self-pay

## 2024-03-12 ENCOUNTER — Other Ambulatory Visit (HOSPITAL_COMMUNITY): Payer: Self-pay

## 2024-03-16 ENCOUNTER — Other Ambulatory Visit (HOSPITAL_COMMUNITY): Payer: Self-pay

## 2024-03-23 ENCOUNTER — Other Ambulatory Visit: Payer: Self-pay

## 2024-03-25 ENCOUNTER — Other Ambulatory Visit: Payer: Self-pay

## 2024-04-09 ENCOUNTER — Other Ambulatory Visit (HOSPITAL_COMMUNITY): Payer: Self-pay

## 2024-04-12 ENCOUNTER — Other Ambulatory Visit (HOSPITAL_COMMUNITY): Payer: Self-pay

## 2024-04-12 MED ORDER — ALLOPURINOL 100 MG PO TABS
200.0000 mg | ORAL_TABLET | Freq: Every day | ORAL | 5 refills | Status: DC
  Filled 2024-04-12: qty 60, 30d supply, fill #0
  Filled 2024-05-10: qty 60, 30d supply, fill #1
  Filled 2024-06-07: qty 60, 30d supply, fill #2
  Filled 2024-07-06: qty 60, 30d supply, fill #3
  Filled 2024-08-05: qty 60, 30d supply, fill #4
  Filled 2024-09-01: qty 60, 30d supply, fill #5

## 2024-04-12 MED ORDER — ICOSAPENT ETHYL 1 G PO CAPS
2.0000 g | ORAL_CAPSULE | Freq: Two times a day (BID) | ORAL | 5 refills | Status: DC
  Filled 2024-04-12: qty 120, 30d supply, fill #0
  Filled 2024-05-10: qty 120, 30d supply, fill #1
  Filled 2024-06-07: qty 120, 30d supply, fill #2
  Filled 2024-07-06: qty 120, 30d supply, fill #3
  Filled 2024-08-05: qty 120, 30d supply, fill #4
  Filled 2024-09-01: qty 120, 30d supply, fill #5

## 2024-04-12 MED ORDER — LOSARTAN POTASSIUM 25 MG PO TABS
25.0000 mg | ORAL_TABLET | Freq: Every day | ORAL | 5 refills | Status: DC
  Filled 2024-04-12: qty 30, 30d supply, fill #0
  Filled 2024-05-10: qty 30, 30d supply, fill #1
  Filled 2024-06-07: qty 30, 30d supply, fill #2
  Filled 2024-07-06: qty 30, 30d supply, fill #3
  Filled 2024-08-05: qty 30, 30d supply, fill #4
  Filled 2024-09-01: qty 30, 30d supply, fill #5

## 2024-05-06 ENCOUNTER — Other Ambulatory Visit (HOSPITAL_COMMUNITY): Payer: Self-pay

## 2024-05-13 ENCOUNTER — Other Ambulatory Visit: Payer: Self-pay

## 2024-05-20 ENCOUNTER — Other Ambulatory Visit (HOSPITAL_COMMUNITY): Payer: Self-pay

## 2024-05-21 ENCOUNTER — Other Ambulatory Visit (HOSPITAL_COMMUNITY): Payer: Self-pay

## 2024-05-21 ENCOUNTER — Other Ambulatory Visit: Payer: Self-pay

## 2024-05-21 MED ORDER — PREDNISONE 5 MG PO TABS
5.0000 mg | ORAL_TABLET | Freq: Every day | ORAL | 5 refills | Status: DC
  Filled 2024-05-21: qty 30, 30d supply, fill #0
  Filled 2024-06-14: qty 30, 30d supply, fill #1
  Filled 2024-07-13: qty 30, 30d supply, fill #2
  Filled 2024-08-12: qty 30, 30d supply, fill #3
  Filled 2024-09-05: qty 30, 30d supply, fill #4
  Filled 2024-10-04: qty 30, 30d supply, fill #5

## 2024-05-26 ENCOUNTER — Ambulatory Visit (INDEPENDENT_AMBULATORY_CARE_PROVIDER_SITE_OTHER): Payer: 59 | Admitting: Otolaryngology

## 2024-05-26 ENCOUNTER — Encounter (INDEPENDENT_AMBULATORY_CARE_PROVIDER_SITE_OTHER): Payer: Self-pay | Admitting: Otolaryngology

## 2024-05-26 VITALS — BP 146/76 | HR 61

## 2024-05-26 DIAGNOSIS — H903 Sensorineural hearing loss, bilateral: Secondary | ICD-10-CM

## 2024-05-26 DIAGNOSIS — H6123 Impacted cerumen, bilateral: Secondary | ICD-10-CM

## 2024-05-27 DIAGNOSIS — H903 Sensorineural hearing loss, bilateral: Secondary | ICD-10-CM | POA: Insufficient documentation

## 2024-05-27 DIAGNOSIS — H6123 Impacted cerumen, bilateral: Secondary | ICD-10-CM | POA: Insufficient documentation

## 2024-05-27 NOTE — Progress Notes (Signed)
 Patient ID: [name], male   DOB: Jun 08, 1961, 63 y.o.   MRN: [id number]  Follow-up: Hearing loss  HPI: The patient is a 63 year old male who returns today for his follow-up evaluation.  The patient was last seen 1 year ago.  At that time, he was complaining of hearing difficulty.  He was noted to have bilateral high-frequency sensorineural hearing loss, likely secondary to routine presbycusis.  The patient returns today reporting no significant change in his hearing.  He denies any otalgia, otorrhea, or vertigo.  Exam: General: Communicates without difficulty, well nourished, no acute distress. Head: Normocephalic, no evidence injury, no tenderness, facial buttresses intact without stepoff. Face/sinus: No tenderness to palpation and percussion. Facial movement is normal and symmetric. Eyes: PERRL, EOMI. No scleral icterus, conjunctivae clear. Neuro: CN II exam reveals vision grossly intact.  No nystagmus at any point of gaze. Ears: Auricles well formed without lesions.  Bilateral cerumen impaction.  Nose: External evaluation reveals normal support and skin without lesions.  Dorsum is intact.  Anterior rhinoscopy reveals congested mucosa over anterior aspect of inferior turbinates and intact septum.  No purulence noted. Oral:  Oral cavity and oropharynx are intact, symmetric, without erythema or edema.  Mucosa is moist without lesions. Neck: Full range of motion without pain.  There is no significant lymphadenopathy.  No masses palpable.  Thyroid  bed within normal limits to palpation.  Parotid glands and submandibular glands equal bilaterally without mass.  Trachea is midline. Neuro:  CN 2-12 grossly intact.   Procedure: Bilateral cerumen disimpaction Anesthesia: None Description: Under the operating microscope, the cerumen is carefully removed with a combination of cerumen currette, alligator forceps, and suction catheters.  After the cerumen is removed, the TMs are noted to be normal.  No mass,  erythema, or lesions. The patient tolerated the procedure well.    Assessment: 1.  Subjectively stable bilateral high-frequency sensorineural hearing loss. 2.  Bilateral cerumen impaction.  After the disimpaction procedure, both ear canals, tympanic membranes, and middle ear spaces are noted to be normal. 3.  The rest of his ENT exam is also normal.  Plan: 1.  Otomicroscopy with bilateral cerumen disimpaction. 2.  The physical exam findings are reviewed with the patient. 3.  The patient is a candidate for hearing amplification.  The hearing aid options are discussed. 4.  The patient will return for reevaluation in 1 year.

## 2024-06-03 ENCOUNTER — Other Ambulatory Visit (HOSPITAL_COMMUNITY): Payer: Self-pay

## 2024-06-07 ENCOUNTER — Other Ambulatory Visit (HOSPITAL_COMMUNITY): Payer: Self-pay

## 2024-06-08 DIAGNOSIS — I2510 Atherosclerotic heart disease of native coronary artery without angina pectoris: Secondary | ICD-10-CM | POA: Diagnosis not present

## 2024-06-08 DIAGNOSIS — D751 Secondary polycythemia: Secondary | ICD-10-CM | POA: Diagnosis not present

## 2024-06-08 DIAGNOSIS — I4891 Unspecified atrial fibrillation: Secondary | ICD-10-CM | POA: Diagnosis not present

## 2024-06-08 DIAGNOSIS — E785 Hyperlipidemia, unspecified: Secondary | ICD-10-CM | POA: Diagnosis not present

## 2024-06-08 DIAGNOSIS — N400 Enlarged prostate without lower urinary tract symptoms: Secondary | ICD-10-CM | POA: Diagnosis not present

## 2024-06-08 DIAGNOSIS — I4892 Unspecified atrial flutter: Secondary | ICD-10-CM | POA: Diagnosis not present

## 2024-06-08 DIAGNOSIS — I129 Hypertensive chronic kidney disease with stage 1 through stage 4 chronic kidney disease, or unspecified chronic kidney disease: Secondary | ICD-10-CM | POA: Diagnosis not present

## 2024-06-08 DIAGNOSIS — Z940 Kidney transplant status: Secondary | ICD-10-CM | POA: Diagnosis not present

## 2024-06-08 DIAGNOSIS — M109 Gout, unspecified: Secondary | ICD-10-CM | POA: Diagnosis not present

## 2024-06-10 ENCOUNTER — Other Ambulatory Visit (HOSPITAL_COMMUNITY): Payer: Self-pay

## 2024-07-02 ENCOUNTER — Other Ambulatory Visit: Payer: Self-pay

## 2024-07-06 ENCOUNTER — Other Ambulatory Visit: Payer: Self-pay

## 2024-07-06 ENCOUNTER — Other Ambulatory Visit (HOSPITAL_COMMUNITY): Payer: Self-pay

## 2024-07-06 MED ORDER — APIXABAN 5 MG PO TABS
5.0000 mg | ORAL_TABLET | Freq: Two times a day (BID) | ORAL | 5 refills | Status: DC
  Filled 2024-07-06: qty 60, 30d supply, fill #0
  Filled 2024-08-05: qty 60, 30d supply, fill #1
  Filled 2024-09-01: qty 60, 30d supply, fill #2
  Filled 2024-09-27: qty 60, 30d supply, fill #0
  Filled 2024-09-27: qty 60, 30d supply, fill #3
  Filled 2024-10-27 – 2024-10-29 (×3): qty 60, 30d supply, fill #1
  Filled 2024-11-23 – 2024-11-26 (×3): qty 60, 30d supply, fill #2

## 2024-07-10 ENCOUNTER — Other Ambulatory Visit (HOSPITAL_COMMUNITY): Payer: Self-pay

## 2024-07-12 ENCOUNTER — Other Ambulatory Visit (HOSPITAL_COMMUNITY): Payer: Self-pay

## 2024-07-12 ENCOUNTER — Other Ambulatory Visit: Payer: Self-pay

## 2024-07-12 MED ORDER — SULFAMETHOXAZOLE-TRIMETHOPRIM 400-80 MG PO TABS
1.0000 | ORAL_TABLET | ORAL | 5 refills | Status: DC
  Filled 2024-07-12 – 2024-07-13 (×2): qty 12, 28d supply, fill #0
  Filled 2024-08-03 – 2024-08-06 (×2): qty 12, 28d supply, fill #1
  Filled 2024-08-31: qty 12, 28d supply, fill #2
  Filled 2024-09-28: qty 12, 28d supply, fill #3
  Filled 2024-10-22: qty 12, 28d supply, fill #4
  Filled 2024-11-16: qty 12, 28d supply, fill #5

## 2024-07-13 ENCOUNTER — Other Ambulatory Visit (HOSPITAL_COMMUNITY): Payer: Self-pay

## 2024-07-13 ENCOUNTER — Other Ambulatory Visit: Payer: Self-pay

## 2024-07-14 ENCOUNTER — Other Ambulatory Visit: Payer: Self-pay

## 2024-07-15 ENCOUNTER — Other Ambulatory Visit: Payer: Self-pay

## 2024-08-03 ENCOUNTER — Other Ambulatory Visit (HOSPITAL_COMMUNITY): Payer: Self-pay

## 2024-08-05 ENCOUNTER — Other Ambulatory Visit (HOSPITAL_COMMUNITY): Payer: Self-pay

## 2024-08-05 MED ORDER — FENOFIBRATE MICRONIZED 134 MG PO CAPS
134.0000 mg | ORAL_CAPSULE | Freq: Every day | ORAL | 5 refills | Status: AC
  Filled 2024-08-05 – 2024-08-06 (×2): qty 30, 30d supply, fill #0
  Filled 2024-09-01: qty 30, 30d supply, fill #1
  Filled 2024-09-28: qty 30, 30d supply, fill #2
  Filled 2024-10-28: qty 30, 30d supply, fill #3
  Filled 2024-11-23: qty 30, 30d supply, fill #4
  Filled 2024-12-21: qty 30, 30d supply, fill #5

## 2024-08-05 MED ORDER — OMEPRAZOLE 20 MG PO CPDR
20.0000 mg | DELAYED_RELEASE_CAPSULE | Freq: Every day | ORAL | 5 refills | Status: AC
  Filled 2024-08-05 – 2024-08-06 (×2): qty 30, 30d supply, fill #0
  Filled 2024-09-01: qty 30, 30d supply, fill #1
  Filled 2024-09-28: qty 30, 30d supply, fill #2
  Filled 2024-10-28: qty 30, 30d supply, fill #3
  Filled 2024-11-23: qty 30, 30d supply, fill #4
  Filled 2024-12-21: qty 30, 30d supply, fill #5

## 2024-08-06 ENCOUNTER — Other Ambulatory Visit (HOSPITAL_COMMUNITY): Payer: Self-pay

## 2024-08-06 ENCOUNTER — Other Ambulatory Visit: Payer: Self-pay

## 2024-08-06 MED ORDER — TACROLIMUS 1 MG PO CAPS
1.0000 mg | ORAL_CAPSULE | Freq: Every morning | ORAL | 5 refills | Status: AC
  Filled 2024-08-09: qty 30, 30d supply, fill #0
  Filled 2024-09-06: qty 30, 30d supply, fill #1
  Filled 2024-10-13: qty 30, 30d supply, fill #2
  Filled 2024-11-10: qty 30, 30d supply, fill #3
  Filled 2024-12-06: qty 30, 30d supply, fill #4
  Filled 2025-01-04: qty 30, 30d supply, fill #5

## 2024-08-09 ENCOUNTER — Other Ambulatory Visit (HOSPITAL_COMMUNITY): Payer: Self-pay

## 2024-08-09 MED ORDER — OMEPRAZOLE 20 MG PO CPDR: 20.0000 mg | DELAYED_RELEASE_CAPSULE | Freq: Every day | ORAL | 5 refills | Status: AC

## 2024-08-10 ENCOUNTER — Other Ambulatory Visit (HOSPITAL_COMMUNITY): Payer: Self-pay

## 2024-08-10 ENCOUNTER — Other Ambulatory Visit: Payer: Self-pay

## 2024-09-01 ENCOUNTER — Other Ambulatory Visit (HOSPITAL_COMMUNITY): Payer: Self-pay

## 2024-09-05 ENCOUNTER — Other Ambulatory Visit (HOSPITAL_COMMUNITY): Payer: Self-pay

## 2024-09-06 ENCOUNTER — Other Ambulatory Visit: Payer: Self-pay

## 2024-09-06 MED ORDER — TACROLIMUS 0.5 MG PO CAPS
0.5000 mg | ORAL_CAPSULE | Freq: Every evening | ORAL | 5 refills | Status: AC
  Filled 2024-09-06: qty 30, 30d supply, fill #0
  Filled 2024-10-13: qty 30, 30d supply, fill #1
  Filled 2024-11-10: qty 30, 30d supply, fill #2
  Filled 2024-12-06: qty 30, 30d supply, fill #3
  Filled 2025-01-04: qty 30, 30d supply, fill #4

## 2024-09-06 MED ORDER — MYCOPHENOLATE SODIUM 180 MG PO TBEC
720.0000 mg | DELAYED_RELEASE_TABLET | Freq: Two times a day (BID) | ORAL | 5 refills | Status: AC
  Filled 2024-09-06: qty 240, 30d supply, fill #0
  Filled 2024-10-13: qty 240, 30d supply, fill #1
  Filled 2024-11-10: qty 240, 30d supply, fill #2
  Filled 2024-12-06: qty 240, 30d supply, fill #3
  Filled 2025-01-04: qty 240, 30d supply, fill #4

## 2024-09-07 ENCOUNTER — Other Ambulatory Visit: Payer: Self-pay

## 2024-09-07 ENCOUNTER — Other Ambulatory Visit (HOSPITAL_COMMUNITY): Payer: Self-pay

## 2024-09-07 DIAGNOSIS — D1801 Hemangioma of skin and subcutaneous tissue: Secondary | ICD-10-CM | POA: Diagnosis not present

## 2024-09-07 DIAGNOSIS — L812 Freckles: Secondary | ICD-10-CM | POA: Diagnosis not present

## 2024-09-07 DIAGNOSIS — D225 Melanocytic nevi of trunk: Secondary | ICD-10-CM | POA: Diagnosis not present

## 2024-09-07 DIAGNOSIS — D2272 Melanocytic nevi of left lower limb, including hip: Secondary | ICD-10-CM | POA: Diagnosis not present

## 2024-09-07 DIAGNOSIS — B353 Tinea pedis: Secondary | ICD-10-CM | POA: Diagnosis not present

## 2024-09-07 DIAGNOSIS — D2262 Melanocytic nevi of left upper limb, including shoulder: Secondary | ICD-10-CM | POA: Diagnosis not present

## 2024-09-07 DIAGNOSIS — D2261 Melanocytic nevi of right upper limb, including shoulder: Secondary | ICD-10-CM | POA: Diagnosis not present

## 2024-09-07 DIAGNOSIS — D2271 Melanocytic nevi of right lower limb, including hip: Secondary | ICD-10-CM | POA: Diagnosis not present

## 2024-09-07 MED ORDER — KETOCONAZOLE 2 % EX CREA
TOPICAL_CREAM | CUTANEOUS | 1 refills | Status: AC
  Filled 2024-09-07: qty 60, 30d supply, fill #0
  Filled 2024-10-01: qty 60, 30d supply, fill #1

## 2024-09-27 ENCOUNTER — Other Ambulatory Visit (HOSPITAL_COMMUNITY): Payer: Self-pay

## 2024-09-27 ENCOUNTER — Other Ambulatory Visit: Payer: Self-pay

## 2024-09-27 ENCOUNTER — Other Ambulatory Visit (HOSPITAL_BASED_OUTPATIENT_CLINIC_OR_DEPARTMENT_OTHER): Payer: Self-pay

## 2024-09-27 ENCOUNTER — Encounter: Payer: Self-pay | Admitting: Pharmacist

## 2024-09-27 MED ORDER — LOSARTAN POTASSIUM 25 MG PO TABS
25.0000 mg | ORAL_TABLET | Freq: Every day | ORAL | 5 refills | Status: AC
  Filled 2024-09-27: qty 30, 30d supply, fill #0
  Filled 2024-10-27: qty 30, 30d supply, fill #1
  Filled 2024-11-23: qty 30, 30d supply, fill #2
  Filled 2024-12-19: qty 30, 30d supply, fill #3

## 2024-09-27 MED ORDER — ALLOPURINOL 100 MG PO TABS
200.0000 mg | ORAL_TABLET | Freq: Every day | ORAL | 5 refills | Status: AC
  Filled 2024-09-27: qty 60, 30d supply, fill #0
  Filled 2024-10-27: qty 60, 30d supply, fill #1
  Filled 2024-11-23: qty 60, 30d supply, fill #2
  Filled 2024-12-19: qty 60, 30d supply, fill #3

## 2024-09-27 MED ORDER — ICOSAPENT ETHYL 1 G PO CAPS
2.0000 g | ORAL_CAPSULE | Freq: Two times a day (BID) | ORAL | 5 refills | Status: AC
  Filled 2024-09-27: qty 120, 30d supply, fill #0
  Filled 2024-10-27: qty 120, 30d supply, fill #1
  Filled 2024-11-23: qty 120, 30d supply, fill #2
  Filled 2024-12-19: qty 120, 30d supply, fill #3

## 2024-09-28 ENCOUNTER — Other Ambulatory Visit (HOSPITAL_COMMUNITY): Payer: Self-pay

## 2024-09-28 ENCOUNTER — Other Ambulatory Visit: Payer: Self-pay

## 2024-09-29 ENCOUNTER — Other Ambulatory Visit: Payer: Self-pay

## 2024-10-11 DIAGNOSIS — I2510 Atherosclerotic heart disease of native coronary artery without angina pectoris: Secondary | ICD-10-CM | POA: Diagnosis not present

## 2024-10-11 DIAGNOSIS — I4892 Unspecified atrial flutter: Secondary | ICD-10-CM | POA: Diagnosis not present

## 2024-10-11 DIAGNOSIS — D751 Secondary polycythemia: Secondary | ICD-10-CM | POA: Diagnosis not present

## 2024-10-11 DIAGNOSIS — I129 Hypertensive chronic kidney disease with stage 1 through stage 4 chronic kidney disease, or unspecified chronic kidney disease: Secondary | ICD-10-CM | POA: Diagnosis not present

## 2024-10-11 DIAGNOSIS — Z940 Kidney transplant status: Secondary | ICD-10-CM | POA: Diagnosis not present

## 2024-10-11 DIAGNOSIS — N400 Enlarged prostate without lower urinary tract symptoms: Secondary | ICD-10-CM | POA: Diagnosis not present

## 2024-10-11 DIAGNOSIS — E785 Hyperlipidemia, unspecified: Secondary | ICD-10-CM | POA: Diagnosis not present

## 2024-10-11 DIAGNOSIS — M109 Gout, unspecified: Secondary | ICD-10-CM | POA: Diagnosis not present

## 2024-10-11 DIAGNOSIS — I4891 Unspecified atrial fibrillation: Secondary | ICD-10-CM | POA: Diagnosis not present

## 2024-10-14 ENCOUNTER — Other Ambulatory Visit (HOSPITAL_BASED_OUTPATIENT_CLINIC_OR_DEPARTMENT_OTHER): Payer: Self-pay

## 2024-10-20 ENCOUNTER — Other Ambulatory Visit: Payer: Self-pay

## 2024-10-20 ENCOUNTER — Other Ambulatory Visit (HOSPITAL_COMMUNITY): Payer: Self-pay

## 2024-10-20 ENCOUNTER — Telehealth: Admitting: Physician Assistant

## 2024-10-20 DIAGNOSIS — H1033 Unspecified acute conjunctivitis, bilateral: Secondary | ICD-10-CM | POA: Diagnosis not present

## 2024-10-20 MED ORDER — DOXYCYCLINE HYCLATE 100 MG PO TABS
100.0000 mg | ORAL_TABLET | Freq: Two times a day (BID) | ORAL | 0 refills | Status: AC
  Filled 2024-10-20 (×2): qty 14, 7d supply, fill #0

## 2024-10-20 NOTE — Progress Notes (Signed)
 We are sorry that you are not feeling well.  Here is how we plan to help!  Based on what you have shared with me it looks like you have conjunctivitis.  Conjunctivitis is a common inflammatory or infectious condition of the eye that is often referred to as pink eye.  In most cases it is contagious (viral or bacterial). However, not all conjunctivitis requires antibiotics (ex. Allergic).  We have made appropriate suggestions for you based upon your presentation.  I have prescribed Doxycycline  to take twice daily for 7 days.  Pink eye can be highly contagious.  It is typically spread through direct contact with secretions, or contaminated objects or surfaces that one may have touched.  Strict handwashing is suggested with soap and water  is urged.  If not available, use alcohol based had sanitizer.  Avoid unnecessary touching of the eye.  If you wear contact lenses, you will need to refrain from wearing them until you see no white discharge from the eye for at least 24 hours after being on medication.  You should see symptom improvement in 1-2 days after starting the medication regimen.  Call us  if symptoms are not improved in 1-2 days.  Home Care: Wash your hands often! Do not wear your contacts until you complete your treatment plan. Avoid sharing towels, bed linen, personal items with a person who has pink eye. See attention for anyone in your home with similar symptoms.  Get Help Right Away If: Your symptoms do not improve. You develop blurred or loss of vision. Your symptoms worsen (increased discharge, pain or redness)  Your e-visit answers were reviewed by a board certified advanced clinical practitioner to complete your personal care plan.  Depending on the condition, your plan could have included both over the counter or prescription medications.  If there is a problem please reply  once you have received a response from your provider.  Your safety is important to us .  If you have drug  allergies check your prescription carefully.    You can use MyChart to ask questions about today's visit, request a non-urgent call back, or ask for a work or school excuse for 24 hours related to this e-Visit. If it has been greater than 24 hours you will need to follow up with your provider, or enter a new e-Visit to address those concerns.   You will get an e-mail in the next two days asking about your experience.  I hope that your e-visit has been valuable and will speed your recovery. Thank you for using e-visits.  I have spent 5 minutes in review of e-visit questionnaire, review and updating patient chart, medical decision making and response to patient.   Elsie Velma Lunger, PA-C

## 2024-10-22 ENCOUNTER — Other Ambulatory Visit (HOSPITAL_COMMUNITY): Payer: Self-pay

## 2024-10-27 ENCOUNTER — Other Ambulatory Visit: Payer: Self-pay

## 2024-10-27 ENCOUNTER — Other Ambulatory Visit (HOSPITAL_COMMUNITY): Payer: Self-pay

## 2024-10-29 ENCOUNTER — Other Ambulatory Visit: Payer: Self-pay

## 2024-11-03 ENCOUNTER — Other Ambulatory Visit: Payer: Self-pay

## 2024-11-05 ENCOUNTER — Emergency Department (HOSPITAL_BASED_OUTPATIENT_CLINIC_OR_DEPARTMENT_OTHER)
Admission: EM | Admit: 2024-11-05 | Discharge: 2024-11-05 | Disposition: A | Discharge status: Home / Self Care | Attending: Emergency Medicine | Admitting: Emergency Medicine

## 2024-11-05 ENCOUNTER — Emergency Department (HOSPITAL_BASED_OUTPATIENT_CLINIC_OR_DEPARTMENT_OTHER)

## 2024-11-05 ENCOUNTER — Other Ambulatory Visit: Payer: Self-pay

## 2024-11-05 ENCOUNTER — Encounter (HOSPITAL_BASED_OUTPATIENT_CLINIC_OR_DEPARTMENT_OTHER): Payer: Self-pay | Admitting: Emergency Medicine

## 2024-11-05 DIAGNOSIS — N189 Chronic kidney disease, unspecified: Secondary | ICD-10-CM | POA: Insufficient documentation

## 2024-11-05 DIAGNOSIS — I129 Hypertensive chronic kidney disease with stage 1 through stage 4 chronic kidney disease, or unspecified chronic kidney disease: Secondary | ICD-10-CM | POA: Diagnosis not present

## 2024-11-05 DIAGNOSIS — Z7901 Long term (current) use of anticoagulants: Secondary | ICD-10-CM | POA: Insufficient documentation

## 2024-11-05 DIAGNOSIS — W228XXA Striking against or struck by other objects, initial encounter: Secondary | ICD-10-CM | POA: Insufficient documentation

## 2024-11-05 DIAGNOSIS — I482 Chronic atrial fibrillation, unspecified: Secondary | ICD-10-CM | POA: Insufficient documentation

## 2024-11-05 DIAGNOSIS — I2510 Atherosclerotic heart disease of native coronary artery without angina pectoris: Secondary | ICD-10-CM | POA: Diagnosis not present

## 2024-11-05 DIAGNOSIS — M79604 Pain in right leg: Secondary | ICD-10-CM | POA: Diagnosis not present

## 2024-11-05 DIAGNOSIS — Z79899 Other long term (current) drug therapy: Secondary | ICD-10-CM | POA: Insufficient documentation

## 2024-11-05 DIAGNOSIS — S8991XA Unspecified injury of right lower leg, initial encounter: Secondary | ICD-10-CM | POA: Diagnosis not present

## 2024-11-05 LAB — BASIC METABOLIC PANEL WITH GFR
Anion gap: 10 (ref 5–15)
BUN: 19 mg/dL (ref 8–23)
CO2: 25 mmol/L (ref 22–32)
Calcium: 9.7 mg/dL (ref 8.9–10.3)
Chloride: 103 mmol/L (ref 98–111)
Creatinine, Ser: 1.49 mg/dL — ABNORMAL HIGH (ref 0.61–1.24)
GFR, Estimated: 53 mL/min — ABNORMAL LOW (ref >60)
Glucose, Bld: 96 mg/dL (ref 70–99)
Potassium: 4.8 mmol/L (ref 3.5–5.1)
Sodium: 138 mmol/L (ref 135–145)

## 2024-11-05 LAB — CBC WITH DIFFERENTIAL/PLATELET
Abs Immature Granulocytes: 0.06 K/uL (ref 0.00–0.07)
Basophils Absolute: 0 K/uL (ref 0.0–0.1)
Basophils Relative: 0 %
Eosinophils Absolute: 0.1 K/uL (ref 0.0–0.5)
Eosinophils Relative: 1 %
HCT: 45.6 % (ref 39.0–52.0)
Hemoglobin: 14.6 g/dL (ref 13.0–17.0)
Immature Granulocytes: 1 %
Lymphocytes Relative: 20 %
Lymphs Abs: 1.5 K/uL (ref 0.7–4.0)
MCH: 28 pg (ref 26.0–34.0)
MCHC: 32 g/dL (ref 30.0–36.0)
MCV: 87.4 fL (ref 80.0–100.0)
Monocytes Absolute: 0.7 K/uL (ref 0.1–1.0)
Monocytes Relative: 10 %
Neutro Abs: 5.2 K/uL (ref 1.7–7.7)
Neutrophils Relative %: 68 %
Platelets: 144 K/uL — ABNORMAL LOW (ref 150–400)
RBC: 5.22 MIL/uL (ref 4.22–5.81)
RDW: 15.3 % (ref 11.5–15.5)
WBC: 7.6 K/uL (ref 4.0–10.5)
nRBC: 0 % (ref 0.0–0.2)

## 2024-11-05 MED ORDER — DOXYCYCLINE HYCLATE 100 MG PO TABS
100.0000 mg | ORAL_TABLET | Freq: Once | ORAL | Status: AC
  Administered 2024-11-05: 100 mg via ORAL
  Filled 2024-11-05: qty 1

## 2024-11-05 MED ORDER — DOXYCYCLINE HYCLATE 100 MG PO CAPS
100.0000 mg | ORAL_CAPSULE | Freq: Two times a day (BID) | ORAL | 0 refills | Status: AC
  Filled 2024-11-05: qty 20, 10d supply, fill #0

## 2024-11-05 MED ORDER — SODIUM CHLORIDE 0.9 % IV BOLUS
1000.0000 mL | Freq: Once | INTRAVENOUS | Status: AC
  Administered 2024-11-05: 1000 mL via INTRAVENOUS

## 2024-11-05 NOTE — ED Notes (Signed)
 Patient did not want the fluids going in anymore because he did not want to hear the pump beeping anymore.

## 2024-11-05 NOTE — ED Provider Notes (Signed)
 [place] EMERGENCY DEPARTMENT AT [facility] Provider Note   CSN: [id number] Arrival date & time: 11/05/24  1534     Patient presents with: Leg Injury   [name] is a 63 y.o. male with a past medical history significant for hyperlipidemia, CKD, A-fib on Eliquis , CAD, and hypertension who presents to the ED due to right lower extremity pain.  Patient states he hit his shin on a ladder 10 days ago.  He admits to significant pain described as a shooting sensation.  Denies chest pain and shortness of breath.  Currently on Eliquis  which he has been compliant with with no missed doses.  No other injuries.  Denies right lower extremity weakness. Denies fever and chills.   History obtained from patient and past medical records. No interpreter used during encounter.       Prior to Admission medications   Medication Sig Start Date End Date Taking? Authorizing Provider  doxycycline  (VIBRAMYCIN ) 100 MG capsule Take 1 capsule (100 mg total) by mouth 2 (two) times daily. 11/05/24  Yes Raiza Kiesel, Aleck BROCKS, PA-C  acetaminophen -codeine  (TYLENOL  #3) 300-30 MG tablet Take 1-2 tablets by mouth every 8 (eight) hours as needed for moderate pain (pain score 4-6). 12/31/23   Smoot, Lauraine LABOR, PA-C  albuterol  (VENTOLIN  HFA) 108 (90 Base) MCG/ACT inhaler Inhale 1-2 puffs into the lungs every 6 (six) hours as needed for wheezing or shortness of breath. 12/31/23   Smoot, Lauraine LABOR, PA-C  allopurinol  (ZYLOPRIM ) 100 MG tablet Take 2 tablets (200 mg total) by mouth daily. 09/27/24     amiodarone  (PACERONE ) 200 MG tablet Take 1 tablet (200 mg total) by mouth daily. 12/17/23   Fernande Elspeth BROCKS, MD  apixaban  (ELIQUIS ) 5 MG TABS tablet Take 1 tablet (5 mg total) by mouth 2 (two) times daily. 07/06/24     benzonatate  (TESSALON ) 100 MG capsule Take 1 capsule (100 mg total) by mouth every 8 (eight) hours. 12/31/23   Smoot, Lauraine LABOR, PA-C  colchicine  0.6 MG tablet Take 1 tablet (0.6 mg total) by mouth daily as needed. Max  1 tablet daily 12/20/21     doxycycline  (VIBRA -TABS) 100 MG tablet Take 1 tablet (100 mg total) by mouth 2 (two) times daily. 10/20/24   Gladis Elsie BROCKS, PA-C  fenofibrate  micronized (LOFIBRA) 134 MG capsule Take 1 capsule (134 mg total) by mouth daily. 08/05/24     fluticasone (FLONASE) 50 MCG/ACT nasal spray Place 1 spray into both nostrils as needed for allergies or rhinitis.    [provider]  furosemide  (LASIX ) 40 MG tablet Take 1 tablet (40 mg total) by mouth daily as needed. 08/07/21   Fernande Elspeth BROCKS, MD  icosapent  Ethyl (VASCEPA ) 1 g capsule Take 2 capsules (2 g total) by mouth 2 (two) times daily. 09/27/24     ketoconazole  (NIZORAL ) 2 % cream Apply a small amount to skin twice a day 09/07/24     losartan  (COZAAR ) 25 MG tablet Take 1 tablet (25 mg total) by mouth daily. 09/27/24     magnesium  oxide (MAG-OX) 400 (241.3 Mg) MG tablet Take 400 mg by mouth 2 (two) times daily.    [provider]  methocarbamol  (ROBAXIN ) 500 MG tablet Take 1 tablet (500 mg total) by mouth 3 (three) times daily as needed. 04/07/23     methylPREDNISolone  (MEDROL  DOSEPAK) 4 MG TBPK tablet Take as directed on package 12/31/23   Smoot, Lauraine LABOR, PA-C  metoprolol  tartrate (LOPRESSOR ) 25 MG tablet Take 1 tablet (25 mg  total) by mouth 2 (two) times daily. 12/17/23   Fernande Elspeth BROCKS, MD  mycophenolate  (MYFORTIC ) 180 MG EC tablet Take 4 tablets (720 mg total) by mouth 2 (two) times daily. 09/06/24     omeprazole  (PRILOSEC) 20 MG capsule Take 1 capsule (20 mg total) by mouth daily. 08/05/24     omeprazole  (PRILOSEC) 20 MG capsule Take 1 capsule (20 mg total) by mouth daily. 08/09/24     rosuvastatin  (CRESTOR ) 20 MG tablet Take 1 tablet (20 mg total) by mouth daily. 01/28/24   Fernande Elspeth BROCKS, MD  sulfamethoxazole -trimethoprim  (BACTRIM ) 400-80 MG tablet Take 1 tablet by mouth 3 (three) times a week on Mondays, Wednesdays & Fridays 07/12/24     tacrolimus  (PROGRAF ) 0.5 MG capsule Take 1 capsule (0.5 mg total) by mouth  every evening. 09/06/24     tacrolimus  (PROGRAF ) 1 MG capsule Take 1 capsule (1 mg total) by mouth every morning. 08/06/24     tadalafil  (CIALIS ) 5 MG tablet Take 1 tablet by mouth daily. 12/25/23     tadalafil  (CIALIS ) 5 MG tablet Take 1 tablet (5 mg total) by mouth daily. 02/16/24       Allergies: Iodinated contrast media, Penicillins, and Lisinopril    Review of Systems  Respiratory:  Negative for shortness of breath.   Cardiovascular:  Negative for chest pain.  Musculoskeletal:  Positive for arthralgias.  Skin:  Positive for color change.    Updated Vital Signs BP 139/82   Pulse (!) 54   Temp 98.4 F (36.9 C)   Resp 16   Ht 5' 8 (1.727 m)   Wt 93 kg   SpO2 98%   BMI 31.17 kg/m   Physical Exam Vitals and nursing note reviewed.  Constitutional:      General: He is not in acute distress.    Appearance: He is not ill-appearing.  HENT:     Head: Normocephalic.  Eyes:     Pupils: Pupils are equal, round, and reactive to light.  Cardiovascular:     Rate and Rhythm: Normal rate and regular rhythm.     Pulses: Normal pulses.     Heart sounds: Normal heart sounds. No murmur heard.    No friction rub. No gallop.  Pulmonary:     Effort: Pulmonary effort is normal.     Breath sounds: Normal breath sounds.  Abdominal:     General: Abdomen is flat. There is no distension.     Palpations: Abdomen is soft.     Tenderness: There is no abdominal tenderness. There is no guarding or rebound.  Musculoskeletal:        General: Normal range of motion.     Cervical back: Neck supple.     Comments: Edema and erythema to right lower extremity.  See photo below.  Skin:    General: Skin is warm and dry.  Neurological:     General: No focal deficit present.     Mental Status: He is alert.  Psychiatric:        Mood and Affect: Mood normal.        Behavior: Behavior normal.     (all labs ordered are listed, but only abnormal results are displayed) Labs Reviewed  CBC WITH  DIFFERENTIAL/PLATELET - Abnormal; Notable for the following components:      Result Value   Platelets 144 (*)    All other components within normal limits  BASIC METABOLIC PANEL WITH GFR - Abnormal; Notable for the following components:   Creatinine,  Ser 1.49 (*)    GFR, Estimated 53 (*)    All other components within normal limits    EKG: None  Radiology: US  Venous Img Lower Right (DVT Study) Result Date: 11/05/2024 CLINICAL DATA:  Injury EXAM: RIGHT LOWER EXTREMITY VENOUS DOPPLER ULTRASOUND TECHNIQUE: Gray-scale sonography with compression, as well as color and duplex ultrasound, were performed to evaluate the deep venous system(s) from the level of the common femoral vein through the popliteal and proximal calf veins. COMPARISON:  None Available. FINDINGS: VENOUS Normal compressibility of the common femoral, superficial femoral, and popliteal veins, as well as the visualized calf veins. Visualized portions of profunda femoral vein and great saphenous vein unremarkable. No filling defects to suggest DVT on grayscale or color Doppler imaging. Doppler waveforms show normal direction of venous flow, normal respiratory plasticity and response to augmentation. Limited views of the contralateral common femoral vein are unremarkable. OTHER None. Limitations: none IMPRESSION: 1. No evidence of deep venous thrombosis within the right lower extremity. Electronically Signed   By: Ozell Daring M.D.   On: 11/05/2024 19:15   DG Tibia/Fibula Right Result Date: 11/05/2024 CLINICAL DATA:  Right lower extremity pain after injury.  Swelling. EXAM: RIGHT TIBIA AND FIBULA - 2 VIEW COMPARISON:  05/17/2020 FINDINGS: There is no evidence of fracture or other focal bone lesions. Knee and ankle alignment is maintained. Diffuse soft tissue edema. Multiple surgical clips in the medial soft tissues. Prominent vascular calcifications. No radiopaque foreign body. IMPRESSION: Soft tissue edema. No acute osseous abnormality.  Electronically Signed   By: Andrea Gasman M.D.   On: 11/05/2024 18:33     Procedures   Medications Ordered in the ED  sodium chloride  0.9 % bolus 1,000 mL (0 mLs Intravenous Stopped 11/05/24 1922)  doxycycline  (VIBRA -TABS) tablet 100 mg (100 mg Oral Given 11/05/24 1930)    Clinical Course as of 11/05/24 1936  Fri Nov 05, 2024  1857 WBC: 7.6 [CA]    Clinical Course User Index [CA] Lorelle Aleck BROCKS, PA-C                                 Medical Decision Making Amount and/or Complexity of Data Reviewed Labs: ordered. Decision-making details documented in ED Course. Radiology: ordered and independent interpretation performed. Decision-making details documented in ED Course.  Risk Prescription drug management.   This patient presents to the ED for concern of RLE pain/edema, this involves an extensive number of treatment options, and is a complaint that carries with it a high risk of complications and morbidity.  The differential diagnosis includes bony fracture, hematoma, DVT, cellulitis, etc  63 year old male presents to the ED due to right lower extremity pain and erythema x 10 days.  Hit his shin against a ladder 10 days ago.  Currently on Eliquis .  Denies chest pain and shortness of breath.  Upon arrival, stable vitals.  Patient well-appearing on exam.  Does have tenderness throughout right shin with surrounding erythema.  See photo above.  Right lower extremity neurovascularly intact with soft compartments.  Right lower extremity slightly larger than left.  Routine labs ordered.  X-ray to rule out bony fracture.  Ultrasound rule out DVT.  If unremarkable will likely cover with antibiotics for possible cellulitis.  X-ray personally reviewed and interpreted which demonstrates soft tissue edema.  No underlying bony fracture.  Ultrasound negative for DVT.  Given significant erythema will cover for possible cellulitis with doxycycline .  Patient given  first dose here in the ED.  Advised  patient to follow-up with PCP in 2 to 3 days for recheck or return for worsening erythema or if he develops a fever.  Patient stable for discharge. Strict ED precautions discussed with patient. Patient states understanding and agrees to plan. Patient discharged home in no acute distress and stable vitals  Co morbidities that complicate the patient evaluation  CKD, CAD, A. Fib  Social Determinants of Health:  Lives independently       Final diagnoses:  Injury of right lower extremity, initial encounter    ED Discharge Orders          Ordered    doxycycline  (VIBRAMYCIN ) 100 MG capsule  2 times daily        11/05/24 1919               Nyan Dufresne C, PA-C 11/05/24 1936    Tonia Chew, MD 11/06/24 [phone]

## 2024-11-05 NOTE — ED Triage Notes (Signed)
 Pt endorses distal RLE pain with redness and swelling after bumping into ladder x 10 days pta.Pt takes plavix 

## 2024-11-05 NOTE — Discharge Instructions (Addendum)
 It was a pleasure taking care of you today.  As discussed, your x-ray did not show any broken bones.  Ultrasound did not show evidence of a blood clot.  I am sending you home with an antibiotic to cover for a possible infection of the skin.  Take as prescribed and finish all antibiotics.  You may take over-the-counter ibuprofen or Tylenol  as needed for pain.  Please have your leg rechecked by PCP on Monday.  Return to the ER for any worsening symptoms.

## 2024-11-06 ENCOUNTER — Other Ambulatory Visit (HOSPITAL_BASED_OUTPATIENT_CLINIC_OR_DEPARTMENT_OTHER): Payer: Self-pay

## 2024-11-11 ENCOUNTER — Other Ambulatory Visit (HOSPITAL_COMMUNITY): Payer: Self-pay

## 2024-11-23 ENCOUNTER — Other Ambulatory Visit: Payer: Self-pay

## 2024-11-23 ENCOUNTER — Other Ambulatory Visit (HOSPITAL_COMMUNITY): Payer: Self-pay

## 2024-11-24 ENCOUNTER — Other Ambulatory Visit: Payer: Self-pay

## 2024-11-26 ENCOUNTER — Other Ambulatory Visit: Payer: Self-pay

## 2024-11-29 ENCOUNTER — Other Ambulatory Visit (HOSPITAL_COMMUNITY): Payer: Self-pay | Admitting: Physician Assistant

## 2024-11-29 ENCOUNTER — Other Ambulatory Visit (HOSPITAL_COMMUNITY): Payer: Self-pay

## 2024-11-30 ENCOUNTER — Other Ambulatory Visit (HOSPITAL_COMMUNITY): Payer: Self-pay

## 2024-11-30 MED ORDER — PREDNISONE 5 MG PO TABS
5.0000 mg | ORAL_TABLET | Freq: Every day | ORAL | 5 refills | Status: AC
  Filled 2024-11-30: qty 30, 30d supply, fill #0
  Filled 2024-12-24: qty 30, 30d supply, fill #1

## 2024-12-03 ENCOUNTER — Other Ambulatory Visit: Payer: Self-pay

## 2024-12-06 ENCOUNTER — Other Ambulatory Visit: Payer: Self-pay

## 2024-12-06 ENCOUNTER — Other Ambulatory Visit (HOSPITAL_COMMUNITY): Payer: Self-pay

## 2024-12-19 ENCOUNTER — Other Ambulatory Visit: Payer: Self-pay

## 2024-12-21 ENCOUNTER — Other Ambulatory Visit: Payer: Self-pay

## 2024-12-21 ENCOUNTER — Other Ambulatory Visit (HOSPITAL_COMMUNITY): Payer: Self-pay

## 2024-12-22 ENCOUNTER — Other Ambulatory Visit (HOSPITAL_COMMUNITY): Payer: Self-pay

## 2024-12-22 MED ORDER — ELIQUIS 5 MG PO TABS
5.0000 mg | ORAL_TABLET | Freq: Two times a day (BID) | ORAL | 5 refills | Status: AC
  Filled 2024-12-22 – 2024-12-24 (×2): qty 60, 30d supply, fill #0
  Filled 2024-12-24: qty 180, 90d supply, fill #0

## 2024-12-24 ENCOUNTER — Telehealth: Payer: Self-pay | Admitting: Cardiology

## 2024-12-24 ENCOUNTER — Other Ambulatory Visit: Payer: Self-pay

## 2024-12-24 NOTE — Telephone Encounter (Signed)
" °*  STAT* If patient is at the pharmacy, call can be transferred to refill team.   1. Which medications need to be refilled? (please list name of each medication and dose if known) metoprolol  tartrate (LOPRESSOR ) 25 MG tablet   2. Which pharmacy/location (including street and city if local pharmacy) is medication to be sent to? [place] [facility]   3. Do they need a 30 day or 90 day supply? 90  Has appt on 3/24  "

## 2024-12-27 ENCOUNTER — Other Ambulatory Visit: Payer: Self-pay

## 2024-12-27 MED ORDER — METOPROLOL TARTRATE 25 MG PO TABS
25.0000 mg | ORAL_TABLET | Freq: Two times a day (BID) | ORAL | 0 refills | Status: AC
  Filled 2024-12-27: qty 180, 90d supply, fill #0

## 2024-12-27 NOTE — Telephone Encounter (Signed)
 Pt scheduled to see Jodie Passey, NP, 02/22/25, refill sent.

## 2024-12-30 ENCOUNTER — Other Ambulatory Visit: Payer: Self-pay

## 2024-12-31 ENCOUNTER — Other Ambulatory Visit (HOSPITAL_COMMUNITY): Payer: Self-pay

## 2024-12-31 ENCOUNTER — Telehealth: Payer: Self-pay | Admitting: Student

## 2024-12-31 NOTE — Telephone Encounter (Signed)
" °*  STAT* If patient is at the pharmacy, call can be transferred to refill team.   1. Which medications need to be refilled? (please list name of each medication and dose if known)   amiodarone  (PACERONE ) 200 MG tablet    2. Would you like to learn more about the convenience, safety, & potential cost savings by using the [facility]? no   3. Are you open to using the [facility] (Type [facility]. no   4. Which pharmacy/location (including street and city if local pharmacy) is medication to be sent to?  [place] [facility]    5. Do they need a 30 day or 90 day supply? 90 day   "

## 2025-01-01 ENCOUNTER — Other Ambulatory Visit (HOSPITAL_COMMUNITY): Payer: Self-pay

## 2025-01-01 MED ORDER — SULFAMETHOXAZOLE-TRIMETHOPRIM 400-80 MG PO TABS
1.0000 | ORAL_TABLET | ORAL | 5 refills | Status: AC
  Filled 2025-01-01: qty 12, 28d supply, fill #0

## 2025-01-03 ENCOUNTER — Other Ambulatory Visit (HOSPITAL_COMMUNITY): Payer: Self-pay

## 2025-01-03 MED ORDER — AMIODARONE HCL 200 MG PO TABS
200.0000 mg | ORAL_TABLET | Freq: Every day | ORAL | 0 refills | Status: AC
  Filled 2025-01-03: qty 90, 90d supply, fill #0

## 2025-01-03 NOTE — Telephone Encounter (Signed)
 REFILL SENT

## 2025-01-04 ENCOUNTER — Other Ambulatory Visit (HOSPITAL_COMMUNITY): Payer: Self-pay

## 2025-01-05 ENCOUNTER — Other Ambulatory Visit: Payer: Self-pay | Admitting: Student

## 2025-01-05 ENCOUNTER — Other Ambulatory Visit: Payer: Self-pay

## 2025-01-06 ENCOUNTER — Other Ambulatory Visit (HOSPITAL_COMMUNITY): Payer: Self-pay

## 2025-01-06 MED ORDER — TACROLIMUS 1 MG PO CAPS
1.0000 mg | ORAL_CAPSULE | Freq: Every morning | ORAL | 5 refills | Status: AC
  Filled 2025-01-06: qty 90, 90d supply, fill #0

## 2025-01-06 MED ORDER — MYCOPHENOLATE SODIUM 180 MG PO TBEC
720.0000 mg | DELAYED_RELEASE_TABLET | Freq: Two times a day (BID) | ORAL | 5 refills | Status: AC
  Filled 2025-01-06: qty 720, 90d supply, fill #0

## 2025-01-06 MED ORDER — TACROLIMUS 0.5 MG PO CAPS
0.5000 mg | ORAL_CAPSULE | Freq: Every evening | ORAL | 5 refills | Status: AC
  Filled 2025-01-06: qty 90, 90d supply, fill #0

## 2025-01-07 ENCOUNTER — Other Ambulatory Visit: Payer: Self-pay

## 2025-02-22 ENCOUNTER — Ambulatory Visit: Admitting: Student
